# Patient Record
Sex: Female | Born: 1945
Health system: Southern US, Community
[De-identification: ages and names within clinical notes are randomized; demographics above are authoritative.]

## PROBLEM LIST (undated history)

## (undated) DIAGNOSIS — E039 Hypothyroidism, unspecified: Secondary | ICD-10-CM

## (undated) DIAGNOSIS — R0600 Dyspnea, unspecified: Secondary | ICD-10-CM

## (undated) DIAGNOSIS — C50919 Malignant neoplasm of unspecified site of unspecified female breast: Secondary | ICD-10-CM

## (undated) DIAGNOSIS — Z8052 Family history of malignant neoplasm of bladder: Secondary | ICD-10-CM

## (undated) DIAGNOSIS — R630 Anorexia: Secondary | ICD-10-CM

## (undated) DIAGNOSIS — F419 Anxiety disorder, unspecified: Secondary | ICD-10-CM

## (undated) DIAGNOSIS — Z800 Family history of malignant neoplasm of digestive organs: Secondary | ICD-10-CM

## (undated) DIAGNOSIS — I499 Cardiac arrhythmia, unspecified: Secondary | ICD-10-CM

## (undated) DIAGNOSIS — K529 Noninfective gastroenteritis and colitis, unspecified: Secondary | ICD-10-CM

## (undated) DIAGNOSIS — Z853 Personal history of malignant neoplasm of breast: Secondary | ICD-10-CM

## (undated) DIAGNOSIS — R42 Dizziness and giddiness: Secondary | ICD-10-CM

## (undated) DIAGNOSIS — Z5189 Encounter for other specified aftercare: Secondary | ICD-10-CM

## (undated) DIAGNOSIS — C259 Malignant neoplasm of pancreas, unspecified: Secondary | ICD-10-CM

## (undated) DIAGNOSIS — K439 Ventral hernia without obstruction or gangrene: Secondary | ICD-10-CM

## (undated) DIAGNOSIS — Z923 Personal history of irradiation: Secondary | ICD-10-CM

## (undated) DIAGNOSIS — I10 Essential (primary) hypertension: Secondary | ICD-10-CM

## (undated) HISTORY — DX: Family history of malignant neoplasm of bladder: Z80.52

## (undated) HISTORY — DX: Family history of malignant neoplasm of digestive organs: Z80.0

## (undated) HISTORY — PX: BREAST LUMPECTOMY: SHX2

## (undated) HISTORY — PX: HERNIA REPAIR: SHX51

## (undated) HISTORY — PX: ABDOMINAL HYSTERECTOMY: SHX81

## (undated) HISTORY — DX: Encounter for other specified aftercare: Z51.89

## (undated) HISTORY — PX: RETINAL DETACHMENT SURGERY: SHX105

---
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
## 1998-10-02 ENCOUNTER — Ambulatory Visit (HOSPITAL_COMMUNITY): Admission: RE | Admit: 1998-10-02 | Discharge: 1998-10-02 | Payer: Self-pay | Admitting: Obstetrics & Gynecology

## 1999-06-30 ENCOUNTER — Encounter: Payer: Self-pay | Admitting: Emergency Medicine

## 1999-06-30 ENCOUNTER — Emergency Department (HOSPITAL_COMMUNITY): Admission: EM | Admit: 1999-06-30 | Discharge: 1999-06-30 | Payer: Self-pay | Admitting: Emergency Medicine

## 1999-10-08 ENCOUNTER — Ambulatory Visit (HOSPITAL_COMMUNITY): Admission: RE | Admit: 1999-10-08 | Discharge: 1999-10-08 | Payer: Self-pay | Admitting: Obstetrics & Gynecology

## 1999-10-08 ENCOUNTER — Encounter: Payer: Self-pay | Admitting: Family Medicine

## 1999-10-15 ENCOUNTER — Ambulatory Visit (HOSPITAL_COMMUNITY): Admission: RE | Admit: 1999-10-15 | Discharge: 1999-10-15 | Payer: Self-pay | Admitting: Family Medicine

## 1999-10-15 ENCOUNTER — Encounter: Payer: Self-pay | Admitting: Family Medicine

## 1999-10-16 ENCOUNTER — Ambulatory Visit (HOSPITAL_COMMUNITY): Admission: RE | Admit: 1999-10-16 | Discharge: 1999-10-16 | Payer: Self-pay | Admitting: Family Medicine

## 1999-10-16 ENCOUNTER — Encounter: Payer: Self-pay | Admitting: Family Medicine

## 1999-10-24 ENCOUNTER — Encounter: Admission: RE | Admit: 1999-10-24 | Discharge: 1999-10-24 | Payer: Self-pay | Admitting: Surgery

## 1999-10-24 ENCOUNTER — Encounter: Payer: Self-pay | Admitting: Surgery

## 1999-10-28 ENCOUNTER — Ambulatory Visit (HOSPITAL_BASED_OUTPATIENT_CLINIC_OR_DEPARTMENT_OTHER): Admission: RE | Admit: 1999-10-28 | Discharge: 1999-10-28 | Payer: Self-pay | Admitting: Surgery

## 1999-10-28 ENCOUNTER — Encounter (INDEPENDENT_AMBULATORY_CARE_PROVIDER_SITE_OTHER): Payer: Self-pay | Admitting: Specialist

## 1999-10-28 ENCOUNTER — Encounter: Payer: Self-pay | Admitting: Surgery

## 1999-11-08 ENCOUNTER — Encounter: Admission: RE | Admit: 1999-11-08 | Discharge: 2000-02-06 | Payer: Self-pay | Admitting: *Deleted

## 1999-11-25 DIAGNOSIS — Z923 Personal history of irradiation: Secondary | ICD-10-CM

## 1999-11-25 HISTORY — DX: Personal history of irradiation: Z92.3

## 2000-04-30 ENCOUNTER — Encounter: Payer: Self-pay | Admitting: Surgery

## 2000-04-30 ENCOUNTER — Encounter: Admission: RE | Admit: 2000-04-30 | Discharge: 2000-04-30 | Payer: Self-pay | Admitting: Surgery

## 2000-07-02 ENCOUNTER — Inpatient Hospital Stay (HOSPITAL_COMMUNITY): Admission: EM | Admit: 2000-07-02 | Discharge: 2000-07-06 | Payer: Self-pay | Admitting: Emergency Medicine

## 2000-08-28 ENCOUNTER — Encounter: Admission: RE | Admit: 2000-08-28 | Discharge: 2000-08-28 | Payer: Self-pay | Admitting: Surgery

## 2000-08-28 ENCOUNTER — Encounter: Payer: Self-pay | Admitting: Surgery

## 2000-11-03 ENCOUNTER — Encounter: Admission: RE | Admit: 2000-11-03 | Discharge: 2000-11-03 | Payer: Self-pay | Admitting: Surgery

## 2000-11-03 ENCOUNTER — Encounter: Payer: Self-pay | Admitting: Surgery

## 2001-06-05 ENCOUNTER — Emergency Department (HOSPITAL_COMMUNITY): Admission: EM | Admit: 2001-06-05 | Discharge: 2001-06-05 | Payer: Self-pay | Admitting: Emergency Medicine

## 2001-06-07 ENCOUNTER — Encounter: Payer: Self-pay | Admitting: Family Medicine

## 2001-06-07 ENCOUNTER — Encounter: Admission: RE | Admit: 2001-06-07 | Discharge: 2001-06-07 | Payer: Self-pay | Admitting: Family Medicine

## 2001-11-04 ENCOUNTER — Encounter: Payer: Self-pay | Admitting: Surgery

## 2001-11-04 ENCOUNTER — Encounter: Admission: RE | Admit: 2001-11-04 | Discharge: 2001-11-04 | Payer: Self-pay | Admitting: Surgery

## 2002-05-17 ENCOUNTER — Emergency Department (HOSPITAL_COMMUNITY): Admission: EM | Admit: 2002-05-17 | Discharge: 2002-05-17 | Payer: Self-pay | Admitting: Emergency Medicine

## 2002-11-07 ENCOUNTER — Encounter: Payer: Self-pay | Admitting: Surgery

## 2002-11-07 ENCOUNTER — Encounter: Admission: RE | Admit: 2002-11-07 | Discharge: 2002-11-07 | Payer: Self-pay | Admitting: Surgery

## 2003-11-10 ENCOUNTER — Encounter: Admission: RE | Admit: 2003-11-10 | Discharge: 2003-11-10 | Payer: Self-pay | Admitting: Surgery

## 2004-04-19 ENCOUNTER — Emergency Department (HOSPITAL_COMMUNITY): Admission: EM | Admit: 2004-04-19 | Discharge: 2004-04-19 | Payer: Self-pay | Admitting: Emergency Medicine

## 2004-06-04 ENCOUNTER — Emergency Department (HOSPITAL_COMMUNITY): Admission: EM | Admit: 2004-06-04 | Discharge: 2004-06-04 | Payer: Self-pay | Admitting: Emergency Medicine

## 2004-11-13 ENCOUNTER — Encounter: Admission: RE | Admit: 2004-11-13 | Discharge: 2004-11-13 | Payer: Self-pay | Admitting: Surgery

## 2005-12-03 ENCOUNTER — Encounter: Admission: RE | Admit: 2005-12-03 | Discharge: 2005-12-03 | Payer: Self-pay | Admitting: Surgery

## 2006-03-17 ENCOUNTER — Emergency Department (HOSPITAL_COMMUNITY): Admission: EM | Admit: 2006-03-17 | Discharge: 2006-03-17 | Payer: Self-pay | Admitting: Emergency Medicine

## 2006-11-19 ENCOUNTER — Inpatient Hospital Stay (HOSPITAL_COMMUNITY): Admission: EM | Admit: 2006-11-19 | Discharge: 2006-11-22 | Payer: Self-pay | Admitting: Emergency Medicine

## 2006-12-07 ENCOUNTER — Encounter: Admission: RE | Admit: 2006-12-07 | Discharge: 2006-12-07 | Payer: Self-pay | Admitting: Family Medicine

## 2007-10-24 ENCOUNTER — Emergency Department (HOSPITAL_COMMUNITY): Admission: EM | Admit: 2007-10-24 | Discharge: 2007-10-24 | Payer: Self-pay | Admitting: Emergency Medicine

## 2007-12-09 ENCOUNTER — Emergency Department (HOSPITAL_COMMUNITY): Admission: EM | Admit: 2007-12-09 | Discharge: 2007-12-09 | Payer: Self-pay | Admitting: Emergency Medicine

## 2007-12-21 ENCOUNTER — Encounter: Admission: RE | Admit: 2007-12-21 | Discharge: 2007-12-21 | Payer: Self-pay | Admitting: Surgery

## 2008-12-21 ENCOUNTER — Encounter: Admission: RE | Admit: 2008-12-21 | Discharge: 2008-12-21 | Payer: Self-pay | Admitting: Family Medicine

## 2009-12-24 ENCOUNTER — Encounter: Admission: RE | Admit: 2009-12-24 | Discharge: 2009-12-24 | Payer: Self-pay | Admitting: Surgery

## 2010-12-16 ENCOUNTER — Other Ambulatory Visit: Payer: Self-pay | Admitting: Family Medicine

## 2010-12-16 DIAGNOSIS — Z1239 Encounter for other screening for malignant neoplasm of breast: Secondary | ICD-10-CM

## 2010-12-31 ENCOUNTER — Ambulatory Visit
Admission: RE | Admit: 2010-12-31 | Discharge: 2010-12-31 | Disposition: A | Discharge status: Home / Self Care | Payer: Self-pay | Source: Ambulatory Visit | Attending: Family Medicine | Admitting: Family Medicine

## 2010-12-31 DIAGNOSIS — Z1239 Encounter for other screening for malignant neoplasm of breast: Secondary | ICD-10-CM

## 2011-04-11 NOTE — Discharge Summary (Signed)
[place]  Patient:    Julie Rollins, Julie Rollins                       MRN: [id number] Adm. Date:  [id number] Disc. Date: 07/06/00 Attending:  Talitha Givens Dictator:   Tereso Newcomer, P.A. CC:         [facility]  Luis Abed, M.D. [facility]   Discharge Summary  DATE OF BIRTH: [birth year]  PROCEDURES:  1. Cardiac catheterization on July 06, 2000 showing normal coronaries and     normal left ventricular function.  2. Adenosine Cardiolite study on July 03, 2000 showing anterior ischemia.  DISCHARGE DIAGNOSES:  1. History of breast cancer with lumpectomy and radiation in March 2001.  2. Fibromyalgia.  3. 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  HISTORY OF PRESENT ILLNESS: This 65 year old female with a history of breast cancer and fibromyalgia presented to the emergency room with complaint of chest pain that radiated into her neck, which started the day prior to admission.  She described her pain as sharp, over the left sternal area, with intensity of 5/10 at its worst.  She denied shortness of breath, diaphoresis, nausea, vomiting, or diarrhea.  She had noted that she had had pain similar to this over the past several months that could come and go after a few hours. She had never sought medical attention for it before.  She denied her pain being associated with activity.  She did note occasionally getting short of breath with activity.  PHYSICAL EXAMINATION:  GENERAL: Initial physical examination revealed a pleasant and oriented female in no acute distress.  NECK: Without JVD or bruits.  LUNGS: Clear to auscultation.  CARDIAC: Regular rate and rhythm with normal S1 and S2 , without murmurs, rubs, or gallops.  ABDOMEN: Soft, nontender, nondistended.  EXTREMITIES: Left lower extremity trace edema.  Pedal pulses 1+.  (Physical examination was performed by Dr.  Charlyne Mom).  LABORATORY DATA: Initial WBC 6800, hemoglobin 12.6, hematocrit 37.7, platelet count 227,000.  Sodium was 140, potassium 4.1, chloride 104, CO2 36, BUN 15, creatinine 1.2, glucose 111.  The first total CK was 68, CK-MB 0.3, troponin less than 0.03.  EKG showed normal sinus rhythm with a rate of 88, diffuse nonspecific ST-T abnormalities noted.  Chest x-ray was normal.  HOSPITAL COURSE: The patient was admitted and ruled out for myocardial infarction by enzymes.  For risk stratification a lipid profile was checked and her total cholesterol was 161, triglyceride 65, HDL 50, and LDL 98.  Given her negative enzymes she was sent for an adenosine Cardiolite study.  This was performed on July 03, 2000 and images showed focal anteroapical ischemia with an EF of 71%.  Therefore, she was held over the weekend for cardiac catheterization.  She had the catheterization performed on the morning of July 06, 2000 with results as noted above.  She had no immediate complications.  She remained in the short-stay area after catheterization. She ambulated well after her rest period was up and her groin was stable prior to discharge, with no hematoma or bruits, and she was therefore discharged home.  DISCHARGE MEDICATIONS:  1. Aspirin 325 mg q.d.  2. Multivitamin p.o. q.d.  DISCHARGE ACTIVITY: No driving, heavy lifting, or exertion for three days. 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  WOUND CARE: The patient is to watch her groin for any increased swelling, bleeding, or bruising, and is to call the office with concerns.  DISCHARGE DIET: Low-fat/low-salt/low cholesterol diet.  FOLLOW-UP: Follow-up is to be with her primary care physician at [facility] in two to three weeks, and she should call to arrange for an appointment. DD:  07/06/00 TD:   07/06/00 Job: [id number]

## 2011-04-11 NOTE — Consult Note (Signed)
NAMEASTIN, RAPE NO.:  [id number]   MEDICAL RECORD NO.:  [id number]          PATIENT TYPE:  INP   LOCATION:  5702                         FACILITY:  [facility]   PHYSICIAN:  Shirley Friar, MDDATE OF BIRTH:  1946/02/26   DATE OF CONSULTATION:  DATE OF DISCHARGE:                                 CONSULTATION   REFERRING:  Marcellus Scott, MD   REASON FOR CONSULTATION:  Bloody diarrhea, colitis on CT scan.   HISTORY OF PRESENT ILLNESS:  This is a 65 year old white female admitted  11/19/2006 secondary to acute onset of bright red blood per rectum with  loose stools for several days associated with lower abdominal pain.  She  denies any episodes of diarrhea or bloody stools prior to this acute  onset of symptoms except for occasional pink tinge on toilet tissue.  On  presentation CT scan done which showed diffuse thickening on the left  side of the colon suggestive of ischemia.  Her C. diff toxin on  preliminary report is negative.  She has had no further bloody diarrhea  since admission.  Her pain has improved.  She reports two sigmoidoscopes  several years ago, unsure of the results, but denies ever having  colonoscopy.   PAST MEDICAL HISTORY:  1. History of breast cancer.  2. Hypothyroidism.   MEDICATIONS:  On admission:  Meclizine, levothyroxine, Flagyl (completed  a 10 day course for bacterial vaginosis).   FAMILY HISTORY:  Denies colon cancer history.   ALLERGIES:  CODEINE.   REVIEW OF SYSTEMS:  Negative except as stated above.   PHYSICAL EXAMINATION:  VITAL SIGNS:  Temperature 98.5, pulse 86, blood  pressure 154/78, O2 sat 95% on room air.  GENERAL:  Alert, no acute distress.  ABDOMEN:  Mild left lower quadrant tenderness otherwise nontender, soft,  nondistended.   LABORATORY:  White blood count 5.2, hemoglobin 12.9, platelet count 268,  BUN five, creatinine 0.5, potassium 3.5.   IMPRESSION:  This is a 65 year old white female with acute  onset of  bloody diarrhea and abdominal pain with thickening of the left side of  her colon in a diffuse pattern that I feel is likely due to ischemia  over infection.  I  doubt she has Crohn's or ulcerative colitis.  I will plan for unprepped  flexible sigmoidoscopy with sedation to be done on 11/21/2006.  She will  be on a clear liquid diet. I have discussed risks, benefits, and  complications and she agrees to proceed.      Shirley Friar, MD  Electronically Signed     VCS/MEDQ  D:  11/20/2006  T:  11/21/2006  Job:  [id number]

## 2011-04-11 NOTE — Discharge Summary (Signed)
[name], CUMBERLAND NO.:  [id number]   MEDICAL RECORD NO.:  [id number]          PATIENT TYPE:  INP   LOCATION:  5702                         FACILITY:  [facility]   PHYSICIAN:  Marcellus Scott, MD     DATE OF BIRTH:  1946/09/14   DATE OF ADMISSION:  11/18/2006  DATE OF DISCHARGE:  11/22/2006                               DISCHARGE SUMMARY   PRIMARY CARE PHYSICIAN:  [facility].   DISCHARGE DIAGNOSES:  1. Left-sided colitis.  2. Hypothyroidism.  3. Depression.   DISCHARGE MEDICATIONS:  1. Celexa 10 mg p.o. q.h.s.  2. Levothyroxine 50 mcg p.o. daily.   PROCEDURES DONE:  1. On the 27th of December, 2007, abdominal CT with contrast.      Impression is left-sided colitis, likely ischemic.  2. Pelvis CT with contrast on 27th of December, 2007.  Impression is      left-sided colitis, likely ischemic.  3. Flexible sigmoidoscopy without prep, done by GI.  Please refer to      the procedure note by Dr. Bosie Clos.  Essentially seemed to indicate      that no colitis was visualized up to 55 cm.   CONSULTATIONS:  GI by Dr. Bosie Clos.   HOSPITAL COURSE AND CONDITION OF THE PATIENT ON DISCHARGE:  For details  of the initial part of the admission, please refer to the History and  Physical note that was done by Dr. Tamsen Roers on the 26th of December, 2007.  In summary, [name] is a pleasant 65 year old Caucasian female, a  patient with a past medical history of breast cancer, hypothyroidism,  presented with the chief complaints of abdominal pain since the night of  25th of December with associated diarrhea which initially did not have  any blood but subsequently noted small amounts of blood.  No history of  any nausea or vomiting.  The patient had just completed 10 days of  Flagyl for an unclear indication.  On further evaluation, the patient  was noted on a CT scan to have a left-sided colitis, suspicious for  ishemic colitis, and patient was admitted to the  hospital for further  evaluation.  1. Left-sided colitis.  Patient was admitted to the hospital, she was      made N.P.O. and provided with intravenous hydration and GI was      consulted who kindly evaluated her and proceeded to do un-prepped      flexible sigmoidoscopy, the findings of which are as above.  The      colitis seemed to have resolved by the time the sigmoidoscopy was      done.  The patient was then resumed on diet which was graduated and      tolerated.  Patient is now asymptomatic of any abdominal pain and      diarrhea.  The patient is stable for discharge.  She is to follow      up with [facility].  She is to call the office for an appointment so      that a colonoscopy can be scheduled.  The patient is  also being      instructed that if she were to have any recurrence of symptoms she      is to contact her primary care physician or return to the emergency      room.  Patient's lipase and lactate levels were normal.  Patient's      blood cultures done x2 have been negative to date and the final      cultures have to be followed.  Urine culture was negative, which is      a final report, and stool sample for culture and C. diff were      negative.  2. Hypothyroidism.  Patient has been clinically and biochemically      euthyroid, and patient has been continued on her Synthroid.  Her      TSH done in the hospital was 2.4.  3. Questionable depression.  Patient has been on Celexa 10 mg p.o.      q.h.s. at home which was continued in the hospital, and there were      no symptoms related to this.  4. A history of breast cancer.  To be followed up as an outpatient.  5. A history of vertigo.  It was not a presenting symptom during this      hospital admission.  6. A history of vaginal discharge.  It also was not a presenting      feature of this admission and patient had completed a course of      Flagyl, probably for this indication.      Marcellus Scott, MD   Electronically Signed     AH/MEDQ  D:  11/22/2006  T:  11/23/2006  Job:  [id number]   cc:   PCP--[facility]  Shirley Friar, MD

## 2011-04-11 NOTE — H&P (Signed)
Julie Rollins                ACCOUNT NO.:  [id number]   MEDICAL RECORD NO.:  [id number]          PATIENT TYPE:  INP   LOCATION:  5702                         FACILITY:  [facility]   PHYSICIAN:  Beckey Rutter, MD  DATE OF BIRTH:  08-Oct-1946   DATE OF ADMISSION:  11/18/2006  DATE OF DISCHARGE:                              HISTORY & PHYSICAL   PRIMARY CARE PHYSICIAN:  [facility]   CHIEF COMPLAINT UPON ADMISSION:  Diarrhea, mild abdominal pain.   HISTORY OF PRESENT ILLNESS:  This is a 65 year old Caucasian female with  past medical history significant of breast cancer, hypothyroidism came  today with a chief complaint of diarrhea.  It started yesterday with  ablation cramps.  Yesterday she had about 2 episodes of diarrhea with  blood-colored stool as she described it.  Patient did not feel well over  the night and this morning she had also 2 diarrheas with dark-colored  stool.  She has abdominal pain and discomfort all the time but the pain  is not that severe.  It is usually around 5/5, comes and goes.  Mainly  on the left side.  Patient denied fever, vomiting, but she admitted to  having headache after she came here to the ER right now.   PAST MEDICAL HISTORY:  1. Breast cancer.  2. Hypothyroidism.  3. Vertigo.  4. Vaginal discharge.   FAMILY HISTORY:  Brother with bladder cancer.   SOCIAL HISTORY:  No tobacco abuse, no illicit drug abuse.   DRUG ALLERGIES:  CODEINE.   MEDICATIONS:  1. Meclizine.  2. Levothyroxine 50 mcg.  3. Celexa 20 mg daily.  4. Flagyl 500 mg 3 times a day for recent bacterial discharge as per      her, bacterial vaginosis.   REVIEW OF SYSTEMS:  Unremarkable.   PHYSICAL EXAMINATION:  Temperature 97.0, pulse 79, respiratory rate is  16, blood pressure 148/77.  GENERAL:  Comfortable in bed, not in distress.  HEAD:  Atraumatic, normocephalic.  Eyes:  PERRLA.  Mouth:  Moist, no  ulcers.  NECK:  Supple.  No JVD.  CHEST:  First,  second heart sound audible.  No added sound appreciated.  LUNGS:  Bilateral fair air entry.  No adventitious sounds.  ABDOMEN:  Soft, nontender, mild tenderness on deep palpation on the left  side and left lumbar region.  EXTREMITIES:  No edema.  SKIN:  Moist.  NEUROLOGICAL:  Alert, oriented x3, giving history.   LABS/X-RAY:  CT abdomen showing the impression of left-sided colitis.  Lactic acid 0.7, within normal limits; lipase 26.  UA negative.  AST 18,  ALT 14, alk phos 75, total bilirubin 0.6, sodium 139, potassium 3.3,  chloride 105, bicarb 26, BUN 7, creatinine 0.6, glucose 121.   ASSESSMENT AND PLAN:  This is a 65 year old female with diarrhea,  abdominal cramps, with evidence of colitis on the CT scan.  Patient  clinically does not seem to be an acute abdomen right now.  We will  admit the patient to the medical floor for further observation and  management.  Patient will be NPO.  We will start IV fluids.  Surgical  consult as well as gastroenterology consult for further management  regarding the decision of colonoscopy once the patient is stable.  We  will continue following up CBC.  We will check a stool guaiac for  documentation.  We will send this for C. diff.  Will repeat lactic  acidosis.  Regarding the hypothyroidism diagnosis from before, we will  continue the same dose of Synthroid.  Patient also is taking Celexa as  per her, for depressed mood.  We will continue the same dose.  We will  repeat potassium and follow up with BMP.  For DVT prophylaxis, patient  will start on heparin subcutaneously.  Patient will be continued on  Protonix IV for her condition, and that will cover the prophylaxis for  GI.      Beckey Rutter, MD  Electronically Signed     EME/MEDQ  D:  11/19/2006  T:  11/20/2006  Job:  [id number]

## 2011-04-11 NOTE — Op Note (Signed)
[place]  Patient:    Julie Rollins                        MRN: [id number] Proc. Date: 10/28/99 Adm. Date:  [id number] Attending:  Charlton Haws CC:         Blair Hailey. 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  PROCEDURE:  Needle localized excision of area of ductal carcinoma in situ - left breast.  SURGEON:  Currie Paris, M.D.  ANESTHESIA:  General.  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  After discussion of alternatives with the patient, we elected to proceed to a wide local excision.  The day of surgery, she mentioned to me a small lipoma that has been present on her side, but we elected at this point since this has been present and asymptomatic  many years to leave it alone.  DESCRIPTION OF PROCEDURE:  Patient brought to the operating room, having had a guidewire placed in the lower, inner quadrant of the left breast.  After satisfactory general anesthesia had been obtained, the breast was prepped.  I made a curvilinear incision just above the guidewire and that thus between the guidewire and the nipple area.  I divided a little of the subcutaneous tissues and then manipulated the wire into the wound.  Using cutting current on the cautery, I took a wide excision of breast tissue down to the chest wall and up under the nipple to get completely and well around the area of the guidewire.  This is sent for specimen mammography.  There is some of what appears to be fibrocystic changes along the inferior edge of what I had taken out, so I took a little bit more from the  inferior border to make sure we did not leave any breast tissue there that might contain some DCIS.  This is marked separately and sent separately.  The wound was irrigated and hemostasis achieved with the cautery.  When everything appeared to be dry, I put some clips on to mark the borders of the excision and then closed with some 3-0 Vicryl followed by 4-0 Monocryl subcuticular plus Steri-Strips. Prior to closing, I infiltrated with approximately 20 cc of 0.25% Marcaine. The patient tolerated the procedure well.  There are no operative complications. All counts are correct. DD:  10/28/99 TD:  10/29/99 Job: [id number]

## 2011-04-11 NOTE — Op Note (Signed)
NAMESABELLA, TRAORE                ACCOUNT NO.:  [id number]   MEDICAL RECORD NO.:  [id number]          PATIENT TYPE:  INP   LOCATION:  5702                         FACILITY:  [facility]   PHYSICIAN:  Shirley Friar, MDDATE OF BIRTH:  Mar 01, 1946   DATE OF PROCEDURE:  11/21/2006  DATE OF DISCHARGE:                               OPERATIVE REPORT   PROCEDURE:  Flexible sigmoidoscopy.   INDICATION:  Bloody diarrhea, colonic thickening on CT scan.   MEDICATIONS:  Fentanyl 100 mcg IV, Versed 10 mg IV.   FINDINGS:  Adult adjustable colonoscope was inserted into a unprepped  colon where solid dark brown stool was noted.  Due to looping and  sigmoid colon, the adult scope was removed and a pediatric colonoscope  was then inserted.  A pediatric colonoscope was able to be advanced up  to 55 cm at the splenic flexure and with repeated irrigation into the  lumen of the colon.  No ulcers or erythema were seen, and no blood was  noted.  Solid dark brown stool prevented evaluation for small polyps.  Retroflexion was done and showed small internal hemorrhoids.   ASSESSMENT:  1. No colitis visualized up to 55 cm.  2. The patient is to have outpatient colonoscopy which will be      scheduled.  3. Advance diet.      Shirley Friar, MD  Electronically Signed     VCS/MEDQ  D:  11/21/2006  T:  11/21/2006  Job:  [id number]

## 2011-04-11 NOTE — Cardiovascular Report (Signed)
[place]. [facility]  Patient:    Julie Rollins, Julie Rollins                       MRN: [id number] Proc. Date: 07/06/00 Adm. Date:  [id number] Attending:  Talitha Givens CC:         Teena Irani. Arlyce Dice, M.D., [facility]  Heywood Footman, N.P., [facility]  Luis Abed, M.D. [facility]  Cardiopulmonary Laboratory   Cardiac Catheterization  CLINICAL HISTORY:  Ms. Dorantes is 65 years old and was admitted with chest pain that was felt to be somewhat atypical for ischemia.  She underwent a Cardiolite scan which suggested anterior ischemia and she was scheduled for catheterization.  DESCRIPTION OF PROCEDURE:  The procedure was performed via the right femoral artery using an arterial sheath and 6 French preformed coronary catheters.  A front wall arterial puncture was performed and Omnipaque contrast was used. At the completion of the study the right femoral artery was closed with Perclose.  The patient tolerated the procedure well and left the laboratory in satisfactory condition.  RESULTS:  The aortic pressure was 150/730 with a mean of 100.  Left ventricular pressure was 150/15.  ANGIOGRAPHY:  The left main coronary artery:  The left main coronary artery was free of significant disease.  Left anterior descending:  The left anterior descending artery gave rise to two diagonal branches and a large septal perforator.  These and the LAD proper were free of significant disease.  Circumflex artery:  The circumflex artery gave rise to a large marginal branch which had three subbranches and an AV branch.  These vessels were free of significant disease.  Right coronary artery:  The right coronary is a moderately large vessel that gave rise to the posterior descending and three posterolateral branches. These vessels were free of significant disease.  LEFT VENTRICULOGRAPHY:  The left ventriculogram was performed in the RAO projection showed  good wall motion with no area of hypokinesis.  The estimated ejection fraction was 60%.  CONCLUSIONS:  Normal coronary angiography and left ventricular wall motion.  RECOMMENDATIONS:  Reassurance.  The patients chest pain is quite atypical and we suspect it is probably musculoskeletal chest pain.  She does have a diagnosis of questionable fibromyalgia.  We will arrange followup with [facility].DD:  07/06/00 TD:  07/06/00 Job: [id number]

## 2011-08-15 LAB — I-STAT 8, (EC8 V) (CONVERTED LAB)
Acid-Base Excess: 1
BUN: 11
Bicarbonate: 27.6 — ABNORMAL HIGH
Chloride: 107
Glucose, Bld: 117 — ABNORMAL HIGH
HCT: 41
Hemoglobin: 13.9
Operator id: 261381
Potassium: 4
Sodium: 139
TCO2: 29
pCO2, Ven: 48.6
pH, Ven: 7.363 — ABNORMAL HIGH

## 2011-08-15 LAB — URINALYSIS, ROUTINE W REFLEX MICROSCOPIC
Bilirubin Urine: NEGATIVE
Glucose, UA: NEGATIVE
Hgb urine dipstick: NEGATIVE
Ketones, ur: NEGATIVE
Nitrite: NEGATIVE
Protein, ur: NEGATIVE
Specific Gravity, Urine: 1.007
Urobilinogen, UA: 0.2
pH: 6.5

## 2011-08-15 LAB — POCT I-STAT CREATININE
Creatinine, Ser: 0.9
Operator id: 261381

## 2011-09-02 LAB — URINALYSIS, ROUTINE W REFLEX MICROSCOPIC
Bilirubin Urine: NEGATIVE
Glucose, UA: NEGATIVE
Hgb urine dipstick: NEGATIVE
Ketones, ur: NEGATIVE
Nitrite: NEGATIVE
Protein, ur: NEGATIVE
Specific Gravity, Urine: 1.019
Urobilinogen, UA: 0.2
pH: 5.5

## 2011-09-02 LAB — OCCULT BLOOD X 1 CARD TO LAB, STOOL: Fecal Occult Bld: NEGATIVE

## 2011-11-27 ENCOUNTER — Other Ambulatory Visit (INDEPENDENT_AMBULATORY_CARE_PROVIDER_SITE_OTHER): Payer: Self-pay | Admitting: Surgery

## 2011-11-27 DIAGNOSIS — Z1231 Encounter for screening mammogram for malignant neoplasm of breast: Secondary | ICD-10-CM

## 2011-11-27 DIAGNOSIS — Z853 Personal history of malignant neoplasm of breast: Secondary | ICD-10-CM

## 2012-01-02 ENCOUNTER — Ambulatory Visit
Admission: RE | Admit: 2012-01-02 | Discharge: 2012-01-02 | Disposition: A | Discharge status: Home / Self Care | Payer: Medicare Other | Source: Ambulatory Visit | Attending: Surgery | Admitting: Surgery

## 2012-01-02 DIAGNOSIS — Z1231 Encounter for screening mammogram for malignant neoplasm of breast: Secondary | ICD-10-CM

## 2012-01-02 DIAGNOSIS — Z853 Personal history of malignant neoplasm of breast: Secondary | ICD-10-CM

## 2012-02-12 ENCOUNTER — Emergency Department (HOSPITAL_COMMUNITY)
Admission: EM | Admit: 2012-02-12 | Discharge: 2012-02-12 | Disposition: A | Discharge status: Home Health | Payer: Medicare Other | Attending: Emergency Medicine | Admitting: Emergency Medicine

## 2012-02-12 ENCOUNTER — Other Ambulatory Visit: Payer: Self-pay

## 2012-02-12 ENCOUNTER — Emergency Department (HOSPITAL_COMMUNITY): Payer: Medicare Other

## 2012-02-12 ENCOUNTER — Encounter (HOSPITAL_COMMUNITY): Payer: Self-pay

## 2012-02-12 DIAGNOSIS — R112 Nausea with vomiting, unspecified: Secondary | ICD-10-CM | POA: Insufficient documentation

## 2012-02-12 DIAGNOSIS — R6883 Chills (without fever): Secondary | ICD-10-CM | POA: Insufficient documentation

## 2012-02-12 DIAGNOSIS — I4949 Other premature depolarization: Secondary | ICD-10-CM | POA: Insufficient documentation

## 2012-02-12 DIAGNOSIS — R9431 Abnormal electrocardiogram [ECG] [EKG]: Secondary | ICD-10-CM | POA: Insufficient documentation

## 2012-02-12 DIAGNOSIS — I493 Ventricular premature depolarization: Secondary | ICD-10-CM

## 2012-02-12 DIAGNOSIS — I10 Essential (primary) hypertension: Secondary | ICD-10-CM | POA: Insufficient documentation

## 2012-02-12 DIAGNOSIS — R197 Diarrhea, unspecified: Secondary | ICD-10-CM | POA: Insufficient documentation

## 2012-02-12 DIAGNOSIS — R070 Pain in throat: Secondary | ICD-10-CM | POA: Insufficient documentation

## 2012-02-12 DIAGNOSIS — R0602 Shortness of breath: Secondary | ICD-10-CM | POA: Insufficient documentation

## 2012-02-12 DIAGNOSIS — E039 Hypothyroidism, unspecified: Secondary | ICD-10-CM | POA: Insufficient documentation

## 2012-02-12 DIAGNOSIS — Z853 Personal history of malignant neoplasm of breast: Secondary | ICD-10-CM | POA: Insufficient documentation

## 2012-02-12 DIAGNOSIS — R61 Generalized hyperhidrosis: Secondary | ICD-10-CM | POA: Insufficient documentation

## 2012-02-12 DIAGNOSIS — R55 Syncope and collapse: Secondary | ICD-10-CM | POA: Insufficient documentation

## 2012-02-12 DIAGNOSIS — R42 Dizziness and giddiness: Secondary | ICD-10-CM | POA: Insufficient documentation

## 2012-02-12 HISTORY — DX: Anxiety disorder, unspecified: F41.9

## 2012-02-12 HISTORY — DX: Noninfective gastroenteritis and colitis, unspecified: K52.9

## 2012-02-12 HISTORY — DX: Malignant neoplasm of unspecified site of unspecified female breast: C50.919

## 2012-02-12 HISTORY — DX: Hypothyroidism, unspecified: E03.9

## 2012-02-12 HISTORY — DX: Essential (primary) hypertension: I10

## 2012-02-12 LAB — POCT I-STAT TROPONIN I

## 2012-02-12 LAB — POCT I-STAT, CHEM 8
BUN: 7 mg/dL (ref 6–23)
Creatinine, Ser: 0.7 mg/dL (ref 0.50–1.10)
Hemoglobin: 14.3 g/dL (ref 12.0–15.0)
Potassium: 3.5 mEq/L (ref 3.5–5.1)
Sodium: 142 mEq/L (ref 135–145)

## 2012-02-12 MED ORDER — SODIUM CHLORIDE 0.9 % IV BOLUS (SEPSIS)
1000.0000 mL | Freq: Once | INTRAVENOUS | Status: AC
  Administered 2012-02-12: 1000 mL via INTRAVENOUS

## 2012-02-12 MED ORDER — ACETAMINOPHEN 160 MG/5ML PO SOLN
650.0000 mg | Freq: Once | ORAL | Status: AC
  Administered 2012-02-12: 650 mg via ORAL
  Filled 2012-02-12: qty 20.3

## 2012-02-12 MED ORDER — ONDANSETRON HCL 4 MG/2ML IJ SOLN
4.0000 mg | Freq: Once | INTRAMUSCULAR | Status: AC
  Administered 2012-02-12: 4 mg via INTRAVENOUS
  Filled 2012-02-12: qty 2

## 2012-02-12 NOTE — Discharge Instructions (Signed)
Near-Syncope Near-syncope is sudden weakness, dizziness, or feeling like you might pass out (faint). This may occur when getting up after sitting or while standing for a long period of time. Near-syncope can be caused by a drop in blood pressure. This is a common reaction, but it may occur to a greater degree in people taking medicines to control their blood pressure. Fainting often occurs when the blood pressure or pulse is too low to provide enough blood flow to the brain to keep you conscious. Fainting and near-syncope are not usually due to serious medical problems. However, certain people should be more cautious in the event of near-syncope, including elderly patients, patients with diabetes, and patients with a history of heart conditions (especially irregular rhythms).  CAUSES   Drop in blood pressure.   Physical pain.   Dehydration.   Heat exhaustion.   Emotional distress.   Low blood sugar.   Internal bleeding.   Heart and circulatory problems.   Infections.  SYMPTOMS   Dizziness.   Feeling sick to your stomach (nauseous).   Nearly fainting.   Body numbness.   Turning pale.   Tunnel vision.   Weakness.  HOME CARE INSTRUCTIONS   Lie down right away if you start feeling like you might faint. Breathe deeply and steadily. Wait until all the symptoms have passed. Most of these episodes last only a few minutes. You may feel tired for several hours.   Drink enough fluids to keep your urine clear or pale yellow.   If you are taking blood pressure or heart medicine, get up slowly, taking several minutes to sit and then stand. This can reduce dizziness that is caused by a drop in blood pressure.  SEEK IMMEDIATE MEDICAL CARE IF:   You have a severe headache.   Unusual pain develops in the chest, abdomen, or back.   There is bleeding from the mouth or rectum, or you have black or tarry stool.   An irregular heartbeat or a very rapid pulse develops.   You have  repeated fainting or seizure-like jerking during an episode.   You faint when sitting or lying down.   You develop confusion.   You have difficulty walking.   Severe weakness develops.   Vision problems develop.  MAKE SURE YOU:   Understand these instructions.   Will watch your condition.   Will get help right away if you are not doing well or get worse.  Document Released: 11/10/2005 Document Revised: 10/30/2011 Document Reviewed: 12/27/2010 [employer] Patient Information 2012 [employer]. 

## 2012-02-12 NOTE — ED Provider Notes (Signed)
History     CSN: [id number]  Arrival date & time 02/12/12  1308   First MD Initiated Contact with Patient 02/12/12 [phone]      Chief Complaint  Patient presents with  . Near Syncope     HPI Pt presents with report of near syncope this morning. Pt reports onset of sore throat, nausea, vomiting and diarrhea since yesterday. Got up early this morning, walked into kitchen and felt dizzy.  Patient denies fever or or vomiting.  No chest pain.  Past Medical History  Diagnosis Date  . Hypothyroid   . Anxiety     No past surgical history on file.  No family history on file.  History  Substance Use Topics  . Smoking status: Never Smoker   . Smokeless tobacco: Not on file  . Alcohol Use: No    OB History    Grav Para Term Preterm Abortions TAB SAB Ect Mult Living                  Review of Systems  All other systems reviewed and are negative.    Allergies  Codeine  Home Medications   Current Outpatient Rx  Name Route Sig Dispense Refill  . GOODY HEADACHE PO Oral Take 1 packet by mouth 2 (two) times daily as needed. For headache    . ALKA-SELTZER PLUS COLD PO Oral Take 2 capsules by mouth every 4 (four) hours as needed. For cold    . CITALOPRAM HYDROBROMIDE 20 MG PO TABS Oral Take 20 mg by mouth at bedtime.    Marland Kitchen LEVOTHYROXINE SODIUM 50 MCG PO TABS Oral Take 50 mcg by mouth every morning.      BP 113/52  Pulse 99  Temp(Src) 98.1 F (36.7 C) (Oral)  Resp 22  Ht 5\' 4"  (1.626 m)  Wt 150 lb (68.04 kg)  BMI 25.75 kg/m2  SpO2 94%  Physical Exam  Nursing note and vitals reviewed. Constitutional: She is oriented to person, place, and time. She appears well-developed and well-nourished. No distress.  HENT:  Head: Normocephalic and atraumatic.  Eyes: Pupils are equal, round, and reactive to light.  Neck: Normal range of motion.  Cardiovascular: Normal rate and intact distal pulses.          Date: 02/12/2012  Rate: 95  Rhythm: normal sinus rhythm with VPCs  QRS  Axis: normal  Intervals: normal  ST/T Wave abnormalities: ST depressions laterally  Conduction Disutrbances:none:   Old EKG Reviewed: changes noted     Pulmonary/Chest: No respiratory distress.  Abdominal: Normal appearance. She exhibits no distension.  Musculoskeletal: Normal range of motion.  Neurological: She is alert and oriented to person, place, and time. No cranial nerve deficit.  Skin: Skin is warm and dry. No rash noted.  Psychiatric: She has a normal mood and affect. Her behavior is normal.    ED Course  Procedures (including critical care time)  Labs Reviewed  POCT I-STAT, CHEM 8 - Abnormal; Notable for the following:    Glucose, Bld 111 (*)    All other components within normal limits  PROTIME-INR  POCT I-STAT TROPONIN I   Dg Chest Portable 1 View  02/12/2012  *RADIOLOGY REPORT*  Clinical Data: Near-syncope with headache and sore throat.  PORTABLE CHEST - 1 VIEW  Comparison: 12/09/2007.  Findings: Trachea is midline.  Heart size stable.  Probable accentuation of the vascular shadows in the right paratracheal region due to apical lordotic technique.  Lungs are somewhat low in volume but  clear.  No pleural fluid.  IMPRESSION: No acute findings.  Original Report Authenticated By: Reyes Ivan, M.D.     1. Vomiting and diarrhea   2. Abnormal EKG   3. VPC's       MDM  I discussed the case with cardiology who is planning on coming to the emergency department for evaluation.        Nelia Shi, MD 02/15/12 1101

## 2012-02-12 NOTE — ED Provider Notes (Signed)
Patient in ED for evaluation of near syncopal episode after experiencing diarrhea and vomiting this morning.  Patient has been seen and cleared by cardiology.  Patient remains asymptomatic.  Consumed a meal in ED without difficulty.  On exam, patient CAROx4.  Lungs CTA bilaterally.  S1/S2, RRR, no murmur.  Active bowel sounds.  Patient will be discharged home with follow-up by her PCP.  Jimmye Norman, NP 02/12/12 2207

## 2012-02-12 NOTE — ED Notes (Signed)
Pt presents with report of near syncope this morning.  Pt reports onset of sore throat, nausea, vomiting and diarrhea since yesterday.  Got up early this morning, walked into kitchen and felt dizzy.

## 2012-02-12 NOTE — Consult Note (Signed)
CARDIOLOGY CONSULT NOTE  Patient ID: Julie Rollins, MRN: [id number], DOB/AGE: 1946-05-16 66 y.o. Admit date: 02/12/2012 Date of Consult: 02/12/2012  Primary Physician: Dr. Mady Gemma Primary Cardiologist: None  Chief Complaint: Presyncope  HPI: 66 y.o. female w/ PMHx significant for Vertigo, hypothyroidism, and normal coronaries by cath '01 who presented to [facility] on 02/12/2012 with complaints of presyncope.  She had an abnormal stress test in 2001 for which she underwent cardiac cath that revealed normal coronary arteries and EF 60%. She has not had any cardiac evaluation since that time and denies anginal symptoms.  She has been evaluated in the past for dizziness and was diagnosed with vertigo and placed on meclizine in 2005. In 2008 & 2009 she was evaluated in the ED for symptoms of lightheadedness and presyncope (unsure of the workup). She reports waking up Thursday morning (02/11/12) with a sore throat, diarrhea, and chills. She took Catering manager for her sore throat and had soup for lunch. She continued to feel poorly throughout the day and couldn't sleep that night. This morning she woke up and felt lightheaded when she got out of bed. She walked into the kitchen and continued to feel lightheaded, sweaty and mildly sob and thought she might pass out. She sat down vomited "a little" and called EMS. She lives alone and stated she gets scared when she feels poorly. She denies chest pain, visual changes, syncope, or palpitations. No sick contacts, fever, weight changes, hematemesis or hematochezia, no change in bladder habits. She is somewhat active. She goes dancing occasionally on the weekends, does housework, and is able to climb stairs without c/o chest pain or presyncopal symptoms.   In the ED she was afebrile and orthostatic, labs significant for normal poc troponin and chem 8. CXR without acute findings. EKG showed NSR 95bpm, nonspecific ST changes. She received zofran and  tylenol for headache.   Past Medical History  Diagnosis Date  . Hypothyroid   . Anxiety   . Breast CA     s/p lumpectomy and radiation 2000  . HTN (hypertension)     "mild hypertension", was on BP med years ago but it caused orthostatic hypotension and she has not been medicated since  . Colitis     2007  Stress 2001-  FOCAL ANTEROAPICAL ISCHEMIA.  POSSIBLE LEFT VENTRICULAR HYPERTROPHY.  QGSEF 71%.  Cath 2001 -  The left main coronary artery:  The left main coronary artery was free of significant disease. Left anterior descending:  The left anterior descending artery gave rise to two diagonal branches and a large septal perforator.  These and the LAD proper were free of significant disease. Circumflex artery:  The circumflex artery gave rise to a large marginal branch which had three subbranches and an AV branch.  These vessels were free of significant disease. Right coronary artery:  The right coronary is a moderately large vessel that gave rise to the posterior descending and three posterolateral branches. These vessels were free of significant disease. LEFT VENTRICULOGRAPHY:  The left ventriculogram was performed in the RAO projection showed good wall motion with no area of hypokinesis.  The estimated ejection fraction was 60%. CONCLUSIONS:  Normal coronary angiography and left ventricular wall motion.   Surgical History:  Past Surgical History  Procedure Date  . Breast lumpectomy     2000  . Hernia repair     20+ years ago  . Abdominal hysterectomy     22yrs ago     Home  Meds: Medication Sig  Aspirin-Acetaminophen-Caffeine (GOODY HEADACHE PO) Take 1 packet by mouth 2 (two) times daily as needed. For headache  Chlorphen-Phenyleph-ASA (ALKA-SELTZER PLUS COLD PO) Take 2 capsules by mouth every 4 (four) hours as needed. For cold  citalopram (CELEXA) 20 MG tablet Take 20 mg by mouth at bedtime.  levothyroxine (SYNTHROID, LEVOTHROID) 50 MCG tablet Take 50 mcg by mouth every  morning.    Inpatient Medications:  . acetaminophen (TYLENOL) oral liquid 160 mg/5 mL  650 mg Oral Once  . ondansetron  4 mg Intravenous Once  . sodium chloride  1,000 mL Intravenous Once   Allergies:  Allergies  Allergen Reactions  . Codeine Nausea Only   Social History  . Marital Status: Divorced   Occupational History  . retired     [employer]    Social History Main Topics  . Smoking status: Never Smoker   . Alcohol Use: No  . Drug Use: No   Social History Narrative   Lives alone     Family History  Problem Relation Age of Onset  . Other      Denies family h/o cardiac disease    Review of Systems: General: (+) chills; negative for fever, night sweats or weight changes.  Cardiovascular: negative for chest pain, shortness of breath, dyspnea on exertion, edema, orthopnea, palpitations, or paroxysmal nocturnal dyspnea Dermatological: negative for rash Respiratory: negative for cough or wheezing Urologic: negative for hematuria Abdominal: (+) n/v/d; negative for bright red blood per rectum, melena, or hematemesis Neurologic: (+) Presyncope; negative for visual changes, syncope All other systems reviewed and are otherwise negative except as noted above.  Labs:  Component Value Date   HGB 14.3 02/12/2012   HCT 42.0 02/12/2012     02/12/2012 10:26  Troponin i, poc 0.01   Lab 02/12/12 0945  NA 142  K 3.5  CL 105  BUN 7  CREATININE 0.70  GLUCOSE 111*     02/12/2012 09:11  Prothrombin Time 14.3  INR 1.09    Radiology/Studies:   02/12/2012 - CXR  Findings: Trachea is midline.  Heart size stable.  Probable accentuation of the vascular shadows in the right paratracheal region due to apical lordotic technique.  Lungs are somewhat low in volume but clear.  No pleural fluid.  IMPRESSION: No acute findings.     EKG: NSR 95bpm, nonspecific ST changes   Physical Exam: Blood pressure 130/67, pulse 82, temperature 98.1 F (36.7 C), temperature source Oral, resp.  rate 19, height 5\' 4"  (1.626 m), weight 150 lb (68.04 kg), SpO2 97.00%. General: Overweight elderly white female in no acute distress. Head: Normocephalic, atraumatic, sclera non-icteric, no xanthomas, nares are without discharge.  Neck: Supple. Negative for carotid bruits. JVD not elevated. Lungs: Clear bilaterally to auscultation without wheezes, rales, or rhonchi. Breathing is unlabored. Heart: RRR with S1 S2. No murmurs, rubs, or gallops appreciated. Abdomen: Soft, non-tender, non-distended with normoactive bowel sounds. No hepatomegaly. No rebound/guarding. No obvious abdominal masses. Msk:  Strength and tone appear normal for age. Extremities: No clubbing or cyanosis. No edema.  Distal pedal pulses are 2+ and equal bilaterally. Neuro: Alert and oriented X 3. Moves all extremities spontaneously. Psych:  Responds to questions appropriately with a normal affect.   Assessment and Plan:  66 y.o. female w/ PMHx significant for Vertigo, hypothyroidism, and normal coronaries by cath '01 who presented to [facility] on 02/12/2012 with complaints of presyncope.  1. Presyncope: Patient presents with presyncopal symptoms in the setting of chills,  sore throat, and diarrhea. She is orthostatic, but otherwise without acute findings. EKG NSR without acute ischemic changes. poc troponin normal. Her symptoms are likely due to dehydration. She has received IVF. Would recommend repeating orthostatic vital signs, EKG, and allow her to eat and drink. No further cardiac workup necessary.  Signed, HOPE, JESSICA PA-C 02/12/2012, 2:43 PM  Patient seen and examined.  Agree with findings of J. Hope   Briefly, patient is a 66 year old with no known CAD Admitted with dizziness.   EKG done showing sublte ST depression. Patient denies CP.  Is somewhat active.  Denies DOE or CP with activity Yesterday ill.  Poor po intake.  Today dizzy. Currently dizziness improved.  C/o headache  No SOB or CP  On exam,  patient ws initially orthostatic with drop in BP.  HR unchanged Lung:  CTA  Cardiac:  RRR  S1, S2, No S3.  No murmurs. Abd:  Benign  Ext  No edema.  EKGs with sl ST depression inferolaterally but not specific.    Impression 1.  EKG changes.  I am not concerned by these changes.  I do not think they represent ischemia.  Patient is asymptomatic.  Should follow up with primary MD  2.  Dizziness.  Clnically improved with fluids  Would repeat orthostatics.  Encouraged fluid and salt intake.  OK to d/c home when BP improves.  Dietrich Pates 6:17 PM 02/12/2012

## 2012-02-12 NOTE — ED Provider Notes (Signed)
1:02 PM Patient in CDU holding for cardiology consult.  Sign out received from Dr Radford Pax.  Patient with N/V/D, sensation of presyncope, found to have EKG changes.  Pt currently reports headache, soreness of her abdomen.  Denies nausea or chest pain.  On exam, pt is A&Ox4, NAD, RRR, CTAB, abd soft, nondistended, no focal tenderness, no guarding, no rebound.    Patient has been seen by [facility] cardiology.  PA Shanda Bumps) and Dr Tenny Craw have seen the patient, have ordered repeat EKG, IVF, orthostatic VS.  Their decision pending.    If they decide no further workup is needed, patient may be discharged home with dx gastroenteritis (per Dr Radford Pax) with return precautions.    3:47 PM Discussed patient with Felicie Morn, NP, who assumes care of patient at change of shift.    Dillard Cannon [place], [place] 02/12/12 1547

## 2012-12-07 ENCOUNTER — Other Ambulatory Visit: Payer: Self-pay | Admitting: Family Medicine

## 2012-12-07 DIAGNOSIS — Z1231 Encounter for screening mammogram for malignant neoplasm of breast: Secondary | ICD-10-CM

## 2012-12-07 DIAGNOSIS — Z9889 Other specified postprocedural states: Secondary | ICD-10-CM

## 2012-12-07 DIAGNOSIS — Z853 Personal history of malignant neoplasm of breast: Secondary | ICD-10-CM

## 2013-01-03 ENCOUNTER — Ambulatory Visit: Payer: Medicare Other

## 2013-01-13 ENCOUNTER — Ambulatory Visit
Admission: RE | Admit: 2013-01-13 | Discharge: 2013-01-13 | Disposition: A | Discharge status: Home / Self Care | Payer: Medicare Other | Source: Ambulatory Visit | Attending: Family Medicine | Admitting: Family Medicine

## 2013-01-13 DIAGNOSIS — Z1231 Encounter for screening mammogram for malignant neoplasm of breast: Secondary | ICD-10-CM

## 2013-01-13 DIAGNOSIS — Z853 Personal history of malignant neoplasm of breast: Secondary | ICD-10-CM

## 2013-01-13 DIAGNOSIS — Z9889 Other specified postprocedural states: Secondary | ICD-10-CM

## 2013-12-13 ENCOUNTER — Other Ambulatory Visit: Payer: Self-pay

## 2013-12-13 DIAGNOSIS — Z1231 Encounter for screening mammogram for malignant neoplasm of breast: Secondary | ICD-10-CM

## 2013-12-13 DIAGNOSIS — Z853 Personal history of malignant neoplasm of breast: Secondary | ICD-10-CM

## 2013-12-13 DIAGNOSIS — Z9889 Other specified postprocedural states: Secondary | ICD-10-CM

## 2014-01-16 ENCOUNTER — Ambulatory Visit
Admission: RE | Admit: 2014-01-16 | Discharge: 2014-01-16 | Disposition: A | Discharge status: Home / Self Care | Payer: Medicare Other | Source: Ambulatory Visit

## 2014-01-16 DIAGNOSIS — Z1231 Encounter for screening mammogram for malignant neoplasm of breast: Secondary | ICD-10-CM

## 2014-01-16 DIAGNOSIS — Z853 Personal history of malignant neoplasm of breast: Secondary | ICD-10-CM

## 2014-01-16 DIAGNOSIS — Z9889 Other specified postprocedural states: Secondary | ICD-10-CM

## 2014-03-08 ENCOUNTER — Encounter (INDEPENDENT_AMBULATORY_CARE_PROVIDER_SITE_OTHER): Payer: Medicare Other | Admitting: Ophthalmology

## 2014-03-08 DIAGNOSIS — H2510 Age-related nuclear cataract, unspecified eye: Secondary | ICD-10-CM

## 2014-03-08 DIAGNOSIS — H33309 Unspecified retinal break, unspecified eye: Secondary | ICD-10-CM

## 2014-03-08 DIAGNOSIS — H43819 Vitreous degeneration, unspecified eye: Secondary | ICD-10-CM

## 2014-03-16 ENCOUNTER — Ambulatory Visit (INDEPENDENT_AMBULATORY_CARE_PROVIDER_SITE_OTHER): Payer: Medicare Other | Admitting: Ophthalmology

## 2014-03-16 DIAGNOSIS — H33309 Unspecified retinal break, unspecified eye: Secondary | ICD-10-CM

## 2014-06-16 ENCOUNTER — Emergency Department (HOSPITAL_COMMUNITY)
Admission: EM | Admit: 2014-06-16 | Discharge: 2014-06-16 | Disposition: A | Discharge status: Home / Self Care | Payer: Medicare Other | Attending: Emergency Medicine | Admitting: Emergency Medicine

## 2014-06-16 ENCOUNTER — Encounter (HOSPITAL_COMMUNITY): Payer: Self-pay | Admitting: Emergency Medicine

## 2014-06-16 DIAGNOSIS — Z853 Personal history of malignant neoplasm of breast: Secondary | ICD-10-CM | POA: Diagnosis not present

## 2014-06-16 DIAGNOSIS — Z8659 Personal history of other mental and behavioral disorders: Secondary | ICD-10-CM | POA: Diagnosis not present

## 2014-06-16 DIAGNOSIS — R42 Dizziness and giddiness: Secondary | ICD-10-CM | POA: Diagnosis not present

## 2014-06-16 DIAGNOSIS — H9312 Tinnitus, left ear: Secondary | ICD-10-CM

## 2014-06-16 DIAGNOSIS — H9319 Tinnitus, unspecified ear: Secondary | ICD-10-CM | POA: Insufficient documentation

## 2014-06-16 DIAGNOSIS — E039 Hypothyroidism, unspecified: Secondary | ICD-10-CM | POA: Diagnosis not present

## 2014-06-16 DIAGNOSIS — I10 Essential (primary) hypertension: Secondary | ICD-10-CM | POA: Insufficient documentation

## 2014-06-16 DIAGNOSIS — Z8719 Personal history of other diseases of the digestive system: Secondary | ICD-10-CM | POA: Insufficient documentation

## 2014-06-16 LAB — URINALYSIS, ROUTINE W REFLEX MICROSCOPIC
Bilirubin Urine: NEGATIVE
Glucose, UA: NEGATIVE mg/dL
Hgb urine dipstick: NEGATIVE
Ketones, ur: NEGATIVE mg/dL
LEUKOCYTES UA: NEGATIVE
NITRITE: NEGATIVE
PROTEIN: NEGATIVE mg/dL
Specific Gravity, Urine: 1.01 (ref 1.005–1.030)
UROBILINOGEN UA: 0.2 mg/dL (ref 0.0–1.0)
pH: 7 (ref 5.0–8.0)

## 2014-06-16 LAB — COMPREHENSIVE METABOLIC PANEL
ALBUMIN: 3.9 g/dL (ref 3.5–5.2)
ALT: 13 U/L (ref 0–35)
ANION GAP: 13 (ref 5–15)
AST: 18 U/L (ref 0–37)
Alkaline Phosphatase: 95 U/L (ref 39–117)
BUN: 13 mg/dL (ref 6–23)
CALCIUM: 9.6 mg/dL (ref 8.4–10.5)
CO2: 25 mEq/L (ref 19–32)
Chloride: 102 mEq/L (ref 96–112)
Creatinine, Ser: 0.6 mg/dL (ref 0.50–1.10)
GFR calc non Af Amer: >90 mL/min (ref >90)
GLUCOSE: 112 mg/dL — AB (ref 70–99)
Potassium: 4 mEq/L (ref 3.7–5.3)
SODIUM: 140 meq/L (ref 137–147)
TOTAL PROTEIN: 7.6 g/dL (ref 6.0–8.3)
Total Bilirubin: 0.2 mg/dL — ABNORMAL LOW (ref 0.3–1.2)

## 2014-06-16 LAB — CBC WITH DIFFERENTIAL/PLATELET
BASOS PCT: 1 % (ref 0–1)
Basophils Absolute: 0 10*3/uL (ref 0.0–0.1)
EOS ABS: 0.1 10*3/uL (ref 0.0–0.7)
EOS PCT: 2 % (ref 0–5)
HCT: 41.5 % (ref 36.0–46.0)
Hemoglobin: 13.9 g/dL (ref 12.0–15.0)
LYMPHS ABS: 0.8 10*3/uL (ref 0.7–4.0)
Lymphocytes Relative: 17 % (ref 12–46)
MCH: 30.8 pg (ref 26.0–34.0)
MCHC: 33.5 g/dL (ref 30.0–36.0)
MCV: 92 fL (ref 78.0–100.0)
MONOS PCT: 7 % (ref 3–12)
Monocytes Absolute: 0.3 10*3/uL (ref 0.1–1.0)
Neutro Abs: 3.3 10*3/uL (ref 1.7–7.7)
Neutrophils Relative %: 73 % (ref 43–77)
PLATELETS: 236 10*3/uL (ref 150–400)
RBC: 4.51 MIL/uL (ref 3.87–5.11)
RDW: 13.5 % (ref 11.5–15.5)
WBC: 4.4 10*3/uL (ref 4.0–10.5)

## 2014-06-16 LAB — TROPONIN I

## 2014-06-16 NOTE — ED Notes (Signed)
Patient states that she has constant ringing and hearing loss in her right ear, but this is not new. It has been going on for some time. Tonight it was the "buzzing" in her left ear that was alarming. It has stopped now. Patient is no obvious distress at this time.

## 2014-06-16 NOTE — ED Notes (Signed)
Per EMS report: pt from home: pt woke up around 02:30 and felt hot and had a buzzing in her left ear.  Pt has tinnitus in her right ear. Pt denies any pain or other symptoms. Skin warm and dry. Pt ambulatory. Pt a/o x 4.  NAD noted.

## 2014-06-16 NOTE — ED Notes (Signed)
Pt reports she felt like she was going to pass out earlier but that symptom has improved since arriving to the ED.

## 2014-06-16 NOTE — ED Provider Notes (Signed)
CSN: [id number]     Arrival date & time 06/16/14  4818 History   First MD Initiated Contact with Patient 06/16/14 [phone]     Chief Complaint  Patient presents with  . Dizziness     (Consider location/radiation/quality/duration/timing/severity/associated sxs/prior Treatment) HPI Patient states she woke up around 2:30 in and felt flushed. She had a buzzing in her left ear. She then became lightheaded. She states she felt like she might pass out. She had no vertiginous sensations. She took her Antivert. EMS was called and patient transported. She states she is feeling much better at present. No longer has any buzzing in her year. She has no lightheadedness. She denies any chest pain or shortness of breath at any point. She denies any vision changes. She had no focal weakness or numbness. Denies any nausea or vomiting. Past Medical History  Diagnosis Date  . Hypothyroid   . Anxiety   . Breast CA     s/p lumpectomy and radiation 2000  . HTN (hypertension)     "mild hypertension", was on BP med years ago but it caused orthostatic hypotension and she has not been medicated since  . Colitis     2007   Past Surgical History  Procedure Laterality Date  . Breast lumpectomy      2000  . Hernia repair      20+ years ago  . Abdominal hysterectomy      43yrs ago   Family History  Problem Relation Age of Onset  . Other      Denies family h/o cardiac disease   History  Substance Use Topics  . Smoking status: Never Smoker   . Smokeless tobacco: Not on file  . Alcohol Use: No   OB History   Grav Para Term Preterm Abortions TAB SAB Ect Mult Living                 Review of Systems  Constitutional: Negative for fever and chills.  Respiratory: Negative for chest tightness and shortness of breath.   Cardiovascular: Negative for chest pain, palpitations and leg swelling.  Gastrointestinal: Negative for nausea, vomiting, abdominal pain, diarrhea and constipation.  Genitourinary: Negative  for dysuria.  Musculoskeletal: Negative for back pain, neck pain and neck stiffness.  Skin: Negative for rash and wound.  Neurological: Positive for dizziness and light-headedness. Negative for syncope, weakness, numbness and headaches.  All other systems reviewed and are negative.     Allergies  Codeine  Home Medications   Prior to Admission medications   Medication Sig Start Date End Date Taking? Authorizing Provider  Aspirin-Acetaminophen-Caffeine (GOODY HEADACHE PO) Take 1 packet by mouth 2 (two) times daily as needed. For headache   Yes Historical Provider, MD  citalopram (CELEXA) 20 MG tablet Take 20 mg by mouth at bedtime.   Yes Historical Provider, MD  levothyroxine (SYNTHROID, LEVOTHROID) 50 MCG tablet Take 50 mcg by mouth every morning.   Yes Historical Provider, MD  meclizine (ANTIVERT) 25 MG tablet Take 25 mg by mouth every 6 (six) hours as needed for dizziness.   Yes Historical Provider, MD   BP 148/74  Pulse 77  Temp(Src) 98 F (36.7 C) (Oral)  Resp 18  SpO2 98% Physical Exam  Nursing note and vitals reviewed. Constitutional: She is oriented to person, place, and time. She appears well-developed and well-nourished. No distress.  HENT:  Head: Normocephalic and atraumatic.  Right Ear: External ear normal.  Left Ear: External ear normal.  Mouth/Throat: Oropharynx is clear  and moist.  Eyes: EOM are normal. Pupils are equal, round, and reactive to light.  No nystagmus  Neck: Normal range of motion. Neck supple.  No meningismus  Cardiovascular: Normal rate and regular rhythm.  Exam reveals no gallop and no friction rub.   No murmur heard. Pulmonary/Chest: Effort normal and breath sounds normal. No respiratory distress. She has no wheezes. She has no rales. She exhibits no tenderness.  Abdominal: Soft. Bowel sounds are normal. She exhibits no distension and no mass. There is no tenderness. There is no rebound and no guarding.  Musculoskeletal: Normal range of motion.  She exhibits no edema and no tenderness.  No calf swelling or tenderness.  Neurological: She is alert and oriented to person, place, and time.  Patient is alert and oriented x3 with clear, goal oriented speech. Patient has 5/5 motor in all extremities. Sensation is intact to light touch. Bilateral finger-to-nose is normal with no signs of dysmetria. Patient has a normal gait and walks without assistance.   Skin: Skin is warm and dry. No rash noted. No erythema.  Psychiatric: She has a normal mood and affect. Her behavior is normal.    ED Course  Procedures (including critical care time) Labs Review Labs Reviewed  CBC WITH DIFFERENTIAL  COMPREHENSIVE METABOLIC PANEL  TROPONIN I  URINALYSIS, ROUTINE W REFLEX MICROSCOPIC    Imaging Review No results found.   EKG Interpretation   Date/Time:  Friday June 16 2014 05:27:36 EDT Ventricular Rate:  74 PR Interval:  170 QRS Duration: 83 QT Interval:  396 QTC Calculation: 439 R Axis:   67 Text Interpretation:  Sinus rhythm Baseline wander in lead(s) V6 Confirmed  by Lita Mains  MD, Pedro Oldenburg ([id number]) on 06/16/2014 6:11:23 AM      MDM   Final diagnoses:  None   Patient remains asymptomatic in the emergency department. She has a normal neurologic exam. EKG without acute findings. No significant orthostatic changes. Patient advised to drink clear fluids, change position slowly and followup with her primary Dr. Return precautions given.     Julianne Rice, MD 06/16/14 [phone]

## 2014-06-16 NOTE — Discharge Instructions (Signed)

## 2014-07-17 ENCOUNTER — Ambulatory Visit (INDEPENDENT_AMBULATORY_CARE_PROVIDER_SITE_OTHER): Payer: Medicare Other | Admitting: Ophthalmology

## 2014-07-17 DIAGNOSIS — H33309 Unspecified retinal break, unspecified eye: Secondary | ICD-10-CM

## 2014-07-17 DIAGNOSIS — H43399 Other vitreous opacities, unspecified eye: Secondary | ICD-10-CM

## 2014-12-14 ENCOUNTER — Other Ambulatory Visit: Payer: Self-pay

## 2014-12-14 DIAGNOSIS — Z1231 Encounter for screening mammogram for malignant neoplasm of breast: Secondary | ICD-10-CM

## 2015-01-17 ENCOUNTER — Ambulatory Visit: Payer: Medicare Other

## 2015-01-19 ENCOUNTER — Ambulatory Visit
Admission: RE | Admit: 2015-01-19 | Discharge: 2015-01-19 | Disposition: A | Discharge status: Home / Self Care | Payer: Medicare Other | Source: Ambulatory Visit

## 2015-01-19 DIAGNOSIS — Z1231 Encounter for screening mammogram for malignant neoplasm of breast: Secondary | ICD-10-CM

## 2015-04-10 ENCOUNTER — Other Ambulatory Visit: Payer: Self-pay | Admitting: Internal Medicine

## 2015-04-10 DIAGNOSIS — E2839 Other primary ovarian failure: Secondary | ICD-10-CM

## 2015-07-19 ENCOUNTER — Ambulatory Visit (INDEPENDENT_AMBULATORY_CARE_PROVIDER_SITE_OTHER): Payer: Medicare Other | Admitting: Ophthalmology

## 2015-08-10 ENCOUNTER — Ambulatory Visit (INDEPENDENT_AMBULATORY_CARE_PROVIDER_SITE_OTHER): Payer: Medicare HMO | Admitting: Ophthalmology

## 2015-08-10 DIAGNOSIS — H33303 Unspecified retinal break, bilateral: Secondary | ICD-10-CM | POA: Diagnosis not present

## 2015-08-10 DIAGNOSIS — H43813 Vitreous degeneration, bilateral: Secondary | ICD-10-CM

## 2015-08-10 DIAGNOSIS — H5213 Myopia, bilateral: Secondary | ICD-10-CM

## 2015-10-19 ENCOUNTER — Encounter (HOSPITAL_COMMUNITY): Payer: Self-pay | Admitting: Emergency Medicine

## 2015-10-19 ENCOUNTER — Emergency Department (HOSPITAL_COMMUNITY)
Admission: EM | Admit: 2015-10-19 | Discharge: 2015-10-19 | Disposition: A | Discharge status: Home / Self Care | Payer: Commercial Managed Care - HMO | Attending: Emergency Medicine | Admitting: Emergency Medicine

## 2015-10-19 DIAGNOSIS — Z8719 Personal history of other diseases of the digestive system: Secondary | ICD-10-CM | POA: Diagnosis not present

## 2015-10-19 DIAGNOSIS — F419 Anxiety disorder, unspecified: Secondary | ICD-10-CM | POA: Diagnosis not present

## 2015-10-19 DIAGNOSIS — Z79899 Other long term (current) drug therapy: Secondary | ICD-10-CM | POA: Insufficient documentation

## 2015-10-19 DIAGNOSIS — R42 Dizziness and giddiness: Secondary | ICD-10-CM

## 2015-10-19 DIAGNOSIS — I10 Essential (primary) hypertension: Secondary | ICD-10-CM | POA: Diagnosis not present

## 2015-10-19 DIAGNOSIS — Z853 Personal history of malignant neoplasm of breast: Secondary | ICD-10-CM | POA: Insufficient documentation

## 2015-10-19 DIAGNOSIS — E039 Hypothyroidism, unspecified: Secondary | ICD-10-CM | POA: Insufficient documentation

## 2015-10-19 HISTORY — DX: Dizziness and giddiness: R42

## 2015-10-19 LAB — I-STAT TROPONIN, ED: Troponin i, poc: 0 ng/mL (ref 0.00–0.08)

## 2015-10-19 LAB — URINALYSIS, ROUTINE W REFLEX MICROSCOPIC
Bilirubin Urine: NEGATIVE
GLUCOSE, UA: NEGATIVE mg/dL
HGB URINE DIPSTICK: NEGATIVE
Ketones, ur: NEGATIVE mg/dL
LEUKOCYTES UA: NEGATIVE
Nitrite: NEGATIVE
PH: 7 (ref 5.0–8.0)
Protein, ur: NEGATIVE mg/dL
SPECIFIC GRAVITY, URINE: 1.005 (ref 1.005–1.030)

## 2015-10-19 LAB — PROTIME-INR
INR: 1 (ref 0.00–1.49)
Prothrombin Time: 13.4 seconds (ref 11.6–15.2)

## 2015-10-19 LAB — CBC WITH DIFFERENTIAL/PLATELET
BASOS PCT: 1 %
Basophils Absolute: 0 10*3/uL (ref 0.0–0.1)
EOS ABS: 0.1 10*3/uL (ref 0.0–0.7)
Eosinophils Relative: 3 %
HEMATOCRIT: 43.8 % (ref 36.0–46.0)
Hemoglobin: 14.3 g/dL (ref 12.0–15.0)
LYMPHS ABS: 1.1 10*3/uL (ref 0.7–4.0)
Lymphocytes Relative: 25 %
MCH: 30.6 pg (ref 26.0–34.0)
MCHC: 32.6 g/dL (ref 30.0–36.0)
MCV: 93.6 fL (ref 78.0–100.0)
MONO ABS: 0.3 10*3/uL (ref 0.1–1.0)
MONOS PCT: 8 %
NEUTROS ABS: 2.6 10*3/uL (ref 1.7–7.7)
Neutrophils Relative %: 63 %
Platelets: 247 10*3/uL (ref 150–400)
RBC: 4.68 MIL/uL (ref 3.87–5.11)
RDW: 13.5 % (ref 11.5–15.5)
WBC: 4.2 10*3/uL (ref 4.0–10.5)

## 2015-10-19 LAB — BASIC METABOLIC PANEL
Anion gap: 6 (ref 5–15)
BUN: 13 mg/dL (ref 6–20)
CALCIUM: 9.6 mg/dL (ref 8.9–10.3)
CO2: 29 mmol/L (ref 22–32)
CREATININE: 0.64 mg/dL (ref 0.44–1.00)
Chloride: 105 mmol/L (ref 101–111)
GFR calc non Af Amer: >60 mL/min (ref >60)
GLUCOSE: 110 mg/dL — AB (ref 65–99)
Potassium: 3.3 mmol/L — ABNORMAL LOW (ref 3.5–5.1)
Sodium: 140 mmol/L (ref 135–145)

## 2015-10-19 MED ORDER — SODIUM CHLORIDE 0.9 % IV SOLN
INTRAVENOUS | Status: DC
  Administered 2015-10-19: 125 mL/h via INTRAVENOUS

## 2015-10-19 NOTE — ED Provider Notes (Signed)
CSN: [id number]     Arrival date & time 10/19/15  [id number] History   First MD Initiated Contact with Patient 10/19/15 [phone]     Chief Complaint  Patient presents with  . Dizziness     HPI Patient presents to the emergency room for evaluation of dizziness. Patient has a history of vertigo. She started having trouble with dizziness this morning. She felt lightheaded as if she was kind of passed out when she was sitting or standing. She did not feel that the room is specifically spinning. This did feel little bit different than her vertigo but she does notice that when she moves her head around her symptoms do get worse. She denies any trouble with her speech. No focal numbness or weakness. No visual symptoms.  Patient also has noticed intermittent trouble with pain in her right lower abdomen and back. It sometimes goes down her leg. Certain activity does make it worse. She denies any trouble with vomiting or diarrhea. No fevers or chills. She denies any trouble with her appetite. No dysuria.  Off-and-on she has noticed some small streaks of blood in her stool. She denies any trouble with any abdominal pain today. Past Medical History  Diagnosis Date  . Hypothyroid   . Anxiety   . Breast CA ([place])     s/p lumpectomy and radiation 2000  . HTN (hypertension)     "mild hypertension", was on BP med years ago but it caused orthostatic hypotension and she has not been medicated since  . Colitis     2007  . Vertigo    Past Surgical History  Procedure Laterality Date  . Breast lumpectomy      2000  . Hernia repair      20+ years ago  . Abdominal hysterectomy      56yrs ago   Family History  Problem Relation Age of Onset  . Other      Denies family h/o cardiac disease   Social History  Substance Use Topics  . Smoking status: Never Smoker   . Smokeless tobacco: None  . Alcohol Use: No   OB History    No data available     Review of Systems  All other systems reviewed and are  negative.     Allergies  Codeine  Home Medications   Prior to Admission medications   Medication Sig Start Date End Date Taking? Authorizing Provider  Aspirin-Acetaminophen-Caffeine (GOODY HEADACHE PO) Take 1 packet by mouth 2 (two) times daily as needed. For headache   Yes Historical Provider, MD  citalopram (CELEXA) 20 MG tablet Take 20 mg by mouth at bedtime.   Yes Historical Provider, MD  ibuprofen (ADVIL,MOTRIN) 200 MG tablet Take 400 mg by mouth every 6 (six) hours as needed (pain).   Yes Historical Provider, MD  levothyroxine (SYNTHROID, LEVOTHROID) 50 MCG tablet Take 50 mcg by mouth every morning.   Yes Historical Provider, MD  meclizine (ANTIVERT) 25 MG tablet Take 25 mg by mouth every 6 (six) hours as needed for dizziness.   Yes Historical Provider, MD   BP 142/65 mmHg  Pulse 71  Temp(Src) 97.9 F (36.6 C)  Resp 18  Ht 5\' 4"  (1.626 m)  Wt 64.864 kg  BMI 24.53 kg/m2  SpO2 98% Physical Exam  Constitutional: She is oriented to person, place, and time. She appears well-developed and well-nourished. No distress.  HENT:  Head: Normocephalic and atraumatic.  Right Ear: External ear normal.  Left Ear: External ear normal.  Mouth/Throat: Oropharynx is clear and moist.  Eyes: Conjunctivae are normal. Right eye exhibits no discharge. Left eye exhibits no discharge. No scleral icterus.  Neck: Neck supple. No tracheal deviation present.  Cardiovascular: Normal rate, regular rhythm and intact distal pulses.   Pulmonary/Chest: Effort normal and breath sounds normal. No stridor. No respiratory distress. She has no wheezes. She has no rales.  Abdominal: Soft. Bowel sounds are normal. She exhibits no distension. There is no tenderness. There is no rebound and no guarding.  Musculoskeletal: She exhibits no edema or tenderness.  Neurological: She is alert and oriented to person, place, and time. She has normal strength. No cranial nerve deficit (No facial droop, extraocular movements  intact, tongue midline ) or sensory deficit. She exhibits normal muscle tone. She displays no seizure activity. Coordination normal.  No pronator drift bilateral upper extrem, able to hold both legs off bed for 5 seconds, sensation intact in all extremities, no visual field cuts, no left or right sided neglect, normal finger-nose exam bilaterally, no nystagmus noted, patient did complain of some vertiginous symptoms when looking at my finger as I moved it   Skin: Skin is warm and dry. No rash noted.  Psychiatric: She has a normal mood and affect.  Nursing note and vitals reviewed.   ED Course  Procedures (including critical care time) Labs Review Labs Reviewed  BASIC METABOLIC PANEL - Abnormal; Notable for the following:    Potassium 3.3 (*)    Glucose, Bld 110 (*)    All other components within normal limits  CBC WITH DIFFERENTIAL/PLATELET  PROTIME-INR  URINALYSIS, ROUTINE W REFLEX MICROSCOPIC (NOT AT [facility])  I-STAT TROPOININ, ED    Imaging Review No results found. I have personally reviewed and evaluated these images and lab results as part of my medical decision-making.   EKG Interpretation   Date/Time:  Friday October 19 2015 10:04:36 EST Ventricular Rate:  83 PR Interval:  165 QRS Duration: 129 QT Interval:  405 QTC Calculation: 476 R Axis:   49 Text Interpretation:  Sinus rhythm Probable left ventricular hypertrophy  Minimal ST elevation, lateral leads qrs widening since last tracing  lateral st changes since last tracing Confirmed by Layann Bluett  MD-J, Cahlil Sattar  [id number]) on 10/19/2015 10:14:55 AM      MDM   Final diagnoses:  Dizziness    Patient's laboratory tests are reassuring. She is not anemic or dehydrated.  Patient's neurologic exam is normal. She does not have any focal deficits. She does not have any ataxia. Patient's able to walk around the emergency room without difficulty.  I doubt that her dizziness is related to stroke. This may be related to her vertigo  issues in the past.  Regarding her abdominal complaints. The patient has no complaints today. The test results are reassuring.  These reasonable to follow up with her doctor this coming week. She should monitor for any worsening symptoms.    Dorie Rank, MD 10/19/15 [phone]

## 2015-10-19 NOTE — ED Notes (Signed)
Doctor at bedside.

## 2015-10-19 NOTE — ED Notes (Signed)
Onset today 0830 woke up 0600 developed dizziness history of vertigo and took home medication for dizziness and states some relief. Alert answering and following commands appropriate.

## 2015-10-19 NOTE — Discharge Instructions (Signed)
Dizziness Dizziness is a common problem. It is a feeling of unsteadiness or light-headedness. You may feel like you are about to faint. Dizziness can lead to injury if you stumble or fall. Anyone can become dizzy, but dizziness is more common in older adults. This condition can be caused by a number of things, including medicines, dehydration, or illness. HOME CARE INSTRUCTIONS Taking these steps may help with your condition: Eating and Drinking  Drink enough fluid to keep your urine clear or pale yellow. This helps to keep you from becoming dehydrated. Try to drink more clear fluids, such as water.  Do not drink alcohol.  Limit your caffeine intake if directed by your health care provider.  Limit your salt intake if directed by your health care provider. Activity  Avoid making quick movements.  Rise slowly from chairs and steady yourself until you feel okay.  In the morning, first sit up on the side of the bed. When you feel okay, stand slowly while you hold onto something until you know that your balance is fine.  Move your legs often if you need to stand in one place for a long time. Tighten and relax your muscles in your legs while you are standing.  Do not drive or operate heavy machinery if you feel dizzy.  Avoid bending down if you feel dizzy. Place items in your home so that they are easy for you to reach without leaning over. Lifestyle  Do not use any tobacco products, including cigarettes, chewing tobacco, or electronic cigarettes. If you need help quitting, ask your health care provider.  Try to reduce your stress level, such as with yoga or meditation. Talk with your health care provider if you need help. General Instructions  Watch your dizziness for any changes.  Take medicines only as directed by your health care provider. Talk with your health care provider if you think that your dizziness is caused by a medicine that you are taking.  Tell a friend or a family  member that you are feeling dizzy. If he or she notices any changes in your behavior, have this person call your health care provider.  Keep all follow-up visits as directed by your health care provider. This is important. SEEK MEDICAL CARE IF:  Your dizziness does not go away.  Your dizziness or light-headedness gets worse.  You feel nauseous.  You have reduced hearing.  You have new symptoms.  You are unsteady on your feet or you feel like the room is spinning. SEEK IMMEDIATE MEDICAL CARE IF:  You vomit or have diarrhea and are unable to eat or drink anything.  You have problems talking, walking, swallowing, or using your arms, hands, or legs.  You feel generally weak.  You are not thinking clearly or you have trouble forming sentences. It may take a friend or family member to notice this.  You have chest pain, abdominal pain, shortness of breath, or sweating.  Your vision changes.  You notice any bleeding.  You have a headache.  You have neck pain or a stiff neck.  You have a fever.   This information is not intended to replace advice given to you by your health care provider. Make sure you discuss any questions you have with your health care provider.   Document Released: 05/06/2001 Document Revised: 03/27/2015 Document Reviewed: 11/06/2014 Elsevier Interactive Patient Education 2016 Else[employer]  Dizziness Dizziness is a common problem. It is a feeling of unsteadiness or light-headedness. You may feel like  you are about to faint. Dizziness can lead to injury if you stumble or fall. Anyone can become dizzy, but dizziness is more common in older adults. This condition can be caused by a number of things, including medicines, dehydration, or illness. HOME CARE INSTRUCTIONS Taking these steps may help with your condition: Eating and Drinking  Drink enough fluid to keep your urine clear or pale yellow. This helps to keep you from becoming dehydrated. Try to drink  more clear fluids, such as water.  Do not drink alcohol.  Limit your caffeine intake if directed by your health care provider.  Limit your salt intake if directed by your health care provider. Activity  Avoid making quick movements.  Rise slowly from chairs and steady yourself until you feel okay.  In the morning, first sit up on the side of the bed. When you feel okay, stand slowly while you hold onto something until you know that your balance is fine.  Move your legs often if you need to stand in one place for a long time. Tighten and relax your muscles in your legs while you are standing.  Do not drive or operate heavy machinery if you feel dizzy.  Avoid bending down if you feel dizzy. Place items in your home so that they are easy for you to reach without leaning over. Lifestyle  Do not use any tobacco products, including cigarettes, chewing tobacco, or electronic cigarettes. If you need help quitting, ask your health care provider.  Try to reduce your stress level, such as with yoga or meditation. Talk with your health care provider if you need help. General Instructions  Watch your dizziness for any changes.  Take medicines only as directed by your health care provider. Talk with your health care provider if you think that your dizziness is caused by a medicine that you are taking.  Tell a friend or a family member that you are feeling dizzy. If he or she notices any changes in your behavior, have this person call your health care provider.  Keep all follow-up visits as directed by your health care provider. This is important. SEEK MEDICAL CARE IF:  Your dizziness does not go away.  Your dizziness or light-headedness gets worse.  You feel nauseous.  You have reduced hearing.  You have new symptoms.  You are unsteady on your feet or you feel like the room is spinning. SEEK IMMEDIATE MEDICAL CARE IF:  You vomit or have diarrhea and are unable to eat or drink  anything.  You have problems talking, walking, swallowing, or using your arms, hands, or legs.  You feel generally weak.  You are not thinking clearly or you have trouble forming sentences. It may take a friend or family member to notice this.  You have chest pain, abdominal pain, shortness of breath, or sweating.  Your vision changes.  You notice any bleeding.  You have a headache.  You have neck pain or a stiff neck.  You have a fever.   This information is not intended to replace advice given to you by your health care provider. Make sure you discuss any questions you have with your health care provider.   Document Released: 05/06/2001 Document Revised: 03/27/2015 Document Reviewed: 11/06/2014 Elsevier Interactive Patient Education [employer].

## 2015-11-20 ENCOUNTER — Encounter (HOSPITAL_COMMUNITY): Payer: Self-pay | Admitting: Emergency Medicine

## 2015-11-20 ENCOUNTER — Emergency Department (HOSPITAL_COMMUNITY)
Admission: EM | Admit: 2015-11-20 | Discharge: 2015-11-20 | Disposition: A | Discharge status: Home / Self Care | Payer: Commercial Managed Care - HMO | Attending: Physician Assistant | Admitting: Physician Assistant

## 2015-11-20 DIAGNOSIS — I10 Essential (primary) hypertension: Secondary | ICD-10-CM | POA: Diagnosis not present

## 2015-11-20 DIAGNOSIS — H9311 Tinnitus, right ear: Secondary | ICD-10-CM | POA: Insufficient documentation

## 2015-11-20 DIAGNOSIS — Z8719 Personal history of other diseases of the digestive system: Secondary | ICD-10-CM | POA: Diagnosis not present

## 2015-11-20 DIAGNOSIS — Z79899 Other long term (current) drug therapy: Secondary | ICD-10-CM | POA: Diagnosis not present

## 2015-11-20 DIAGNOSIS — E039 Hypothyroidism, unspecified: Secondary | ICD-10-CM | POA: Diagnosis not present

## 2015-11-20 DIAGNOSIS — R42 Dizziness and giddiness: Secondary | ICD-10-CM | POA: Insufficient documentation

## 2015-11-20 DIAGNOSIS — Z853 Personal history of malignant neoplasm of breast: Secondary | ICD-10-CM | POA: Insufficient documentation

## 2015-11-20 DIAGNOSIS — F419 Anxiety disorder, unspecified: Secondary | ICD-10-CM | POA: Diagnosis not present

## 2015-11-20 DIAGNOSIS — H9201 Otalgia, right ear: Secondary | ICD-10-CM | POA: Insufficient documentation

## 2015-11-20 NOTE — ED Provider Notes (Signed)
CSN: [id number]     Arrival date & time 11/20/15  1253 History  By signing my name below, I, Julie Rollins, attest that this documentation has been prepared under the direction and in the presence of Lily Kocher, PA-C Electronically Signed: Ladene Artist, ED Scribe 11/20/2015 at 4:21 PM.   Chief Complaint  Patient presents with  . Otalgia   Patient is a 69 y.o. female presenting with ear pain. The history is provided by the patient. No language interpreter was used.  Otalgia Location:  Right Severity:  Mild Onset quality:  Gradual Duration:  1 month Progression:  Worsening Chronicity:  Recurrent Context: not direct blow   Relieved by:  None tried Worsened by:  Swallowing Associated symptoms: tinnitus   Associated symptoms: no congestion, no ear discharge, no fever and no rhinorrhea    HPI Comments: Julie Rollins is a 69 y.o. female, with a h/o vertigo, who presents to the Emergency Department complaining of gradually worsening right ear pain for the past month. Pt reports associated intermittent dizziness and light-headedness last week that has resolved, right ear fullness, tinnitus for the past month, globulus sensation on the right with swallowing. No known injury. She has tried Advil without significant relief. She denies fever, trouble swallowing, ear drainage, congestion, rhinorrhea. Pt has been seen in the ED for the same last month.   Past Medical History  Diagnosis Date  . Hypothyroid   . Anxiety   . Breast CA ([place])     s/p lumpectomy and radiation 2000  . HTN (hypertension)     "mild hypertension", was on BP med years ago but it caused orthostatic hypotension and she has not been medicated since  . Colitis     2007  . Vertigo    Past Surgical History  Procedure Laterality Date  . Breast lumpectomy      2000  . Hernia repair      20+ years ago  . Abdominal hysterectomy      67yrs ago   Family History  Problem Relation Age of Onset  . Other      Denies  family h/o cardiac disease   Social History  Substance Use Topics  . Smoking status: Never Smoker   . Smokeless tobacco: None  . Alcohol Use: No   OB History    No data available     Review of Systems  Constitutional: Negative for fever.  HENT: Positive for ear pain and tinnitus. Negative for congestion, ear discharge, rhinorrhea and trouble swallowing.   Neurological: Positive for dizziness (resolved) and light-headedness (resolved).  All other systems reviewed and are negative.  Allergies  Codeine  Home Medications   Prior to Admission medications   Medication Sig Start Date End Date Taking? Authorizing Provider  Aspirin-Acetaminophen-Caffeine (GOODY HEADACHE PO) Take 1 packet by mouth 2 (two) times daily as needed. For headache    Historical Provider, MD  citalopram (CELEXA) 20 MG tablet Take 20 mg by mouth at bedtime.    Historical Provider, MD  ibuprofen (ADVIL,MOTRIN) 200 MG tablet Take 400 mg by mouth every 6 (six) hours as needed (pain).    Historical Provider, MD  levothyroxine (SYNTHROID, LEVOTHROID) 50 MCG tablet Take 50 mcg by mouth every morning.    Historical Provider, MD  meclizine (ANTIVERT) 25 MG tablet Take 25 mg by mouth every 6 (six) hours as needed for dizziness.    Historical Provider, MD   BP 164/79 mmHg  Pulse 94  Temp(Src) 97.7 F (36.5 C) (  Oral)  Resp 16  SpO2 95% Physical Exam  Constitutional: She is oriented to person, place, and time. She appears well-developed and well-nourished. No distress.  HENT:  Head: Normocephalic and atraumatic.  Right Ear: Tympanic membrane is not bulging.  Mouth/Throat: Oropharynx is clear and moist.  No pain, redness or swelling of the mastoid area. Mild fluid behind the R TM. No bulging of the TM.   Eyes: Conjunctivae and EOM are normal.  Neck: Neck supple. Carotid bruit is not present. No tracheal deviation present.  Cardiovascular: Normal rate, regular rhythm and normal heart sounds.   Pulmonary/Chest: Effort  normal and breath sounds normal. No respiratory distress.  Musculoskeletal: Normal range of motion.  Neurological: She is alert and oriented to person, place, and time. No cranial nerve deficit. She exhibits normal muscle tone. Coordination normal.  Good balance noted.  Skin: Skin is warm and dry.  Psychiatric: She has a normal mood and affect. Her behavior is normal.  Nursing note and vitals reviewed.  ED Course  Procedures (including critical care time) DIAGNOSTIC STUDIES: Oxygen Saturation is 95% on RA, adequate by my interpretation.    COORDINATION OF CARE: 4:07 PM-Discussed treatment plan which includes referral to ENT with pt at bedside and pt agreed to plan.   4:19 PM-Case discussed with Dr. Thomasene Lot.   Labs Review Labs Reviewed - No data to display  Imaging Review No results found. I have personally reviewed and evaluated these images and lab results as part of my medical decision-making.   EKG Interpretation None      MDM  Vital signs reviewed, non-acute. No ear drainage or hx of trauma. Hx of ear problems, and ringing in the past. No gross neuro deficits noted. No acute carotid bruits. Pt has problems with antivert and other meds prescribed. Pt reassured of current exam findings and vital signs. Pt referred to ENT for eval and management.   Final diagnoses:  Otalgia of right ear    *I have reviewed nursing notes, vital signs, and all appropriate lab and imaging results for this patient.**  **I personally performed the services described in this documentation, which was scribed in my presence. The recorded information has been reviewed and is accurate.Lily Kocher, PA-C 11/21/15 [place], MD 11/21/15 2359

## 2015-11-20 NOTE — ED Notes (Signed)
Pt reports R ear fullness and ringing for the past month.

## 2015-11-20 NOTE — Discharge Instructions (Signed)
Your vital signs are well within normal limits. No acute changes noted of your ear or throat. Please see Dr. Wilburn Cornelia, or the ear nose and throat specialist of your choice concerning the changes in your ear. Please return to the emergency department if any unusual drainage, bleeding, high fever, or other changes before seeing the ear nose and throat specialist. Earache An earache, also called otalgia, can be caused by many things. Pain from an earache can be sharp, dull, or burning. The pain may be temporary or constant. Earaches can be caused by problems with the ear, such as infection in either the middle ear or the ear canal, injury, impacted ear wax, middle ear pressure, or a foreign body in the ear. Ear pain can also result from problems in other areas. This is called referred pain. For example, pain can come from a sore throat, a tooth infection, or problems with the jaw or the joint between the jaw and the skull (temporomandibular joint, or TMJ). The cause of an earache is not always easy to identify. Watchful waiting may be appropriate for some earaches until a clear cause of the pain can be found. HOME CARE INSTRUCTIONS Watch your condition for any changes. The following actions may help to lessen any discomfort that you are feeling:  Take medicines only as directed by your health care provider. This includes ear drops.  Apply ice to your outer ear to help reduce pain.  Put ice in a plastic bag.  Place a towel between your skin and the bag.  Leave the ice on for 20 minutes, 2-3 times per day.  Do not put anything in your ear other than medicine that is prescribed by your health care provider.  Try resting in an upright position instead of lying down. This may help to reduce pressure in the middle ear and relieve pain.  Chew gum if it helps to relieve your ear pain.  Control any allergies that you have.  Keep all follow-up visits as directed by your health care provider. This is  important. SEEK MEDICAL CARE IF:  Your pain does not improve within 2 days.  You have a fever.  You have new or worsening symptoms. SEEK IMMEDIATE MEDICAL CARE IF:  You have a severe headache.  You have a stiff neck.  You have difficulty swallowing.  You have redness or swelling behind your ear.  You have drainage from your ear.  You have hearing loss.  You feel dizzy.   This information is not intended to replace advice given to you by your health care provider. Make sure you discuss any questions you have with your health care provider.   Document Released: 06/27/2004 Document Revised: 12/01/2014 Document Reviewed: 06/11/2014 Elsevier Interactive Patient Education [employer] .

## 2015-12-17 ENCOUNTER — Other Ambulatory Visit: Payer: Self-pay

## 2015-12-17 DIAGNOSIS — Z1231 Encounter for screening mammogram for malignant neoplasm of breast: Secondary | ICD-10-CM

## 2016-01-22 ENCOUNTER — Ambulatory Visit
Admission: RE | Admit: 2016-01-22 | Discharge: 2016-01-22 | Disposition: A | Discharge status: Home / Self Care | Payer: Commercial Managed Care - HMO | Source: Ambulatory Visit

## 2016-01-22 DIAGNOSIS — Z1231 Encounter for screening mammogram for malignant neoplasm of breast: Secondary | ICD-10-CM

## 2016-01-23 ENCOUNTER — Encounter: Payer: Self-pay | Admitting: Gastroenterology

## 2016-02-13 ENCOUNTER — Encounter: Payer: Self-pay | Admitting: Gastroenterology

## 2016-02-13 ENCOUNTER — Telehealth: Payer: Self-pay | Admitting: *Deleted

## 2016-02-13 NOTE — Telephone Encounter (Signed)
Pt came in for pre-visit for colonoscopy scheduled on 03/13/16, pt was very anxious and stated "why does she have to have a colonoscopy at 26yr", pt then went on to talk about how she had had flexsig w/out sedation years ago and then had one at [facility] in 2007 and had to have sedation, she was not happy about it, she also had a friend who had a colonoscopy that had a perforation, explained to pt that is rare but can happen and who is more susceptible, pt then wanted to know how long Dr Silverio Decamp has been practicing, I advised pt of her credentials. Pt is still very nervous to have colonoscopy and wants to know if there are any other options, she has no family hx of colon ca, advised pt I could schedule her an appointment with Dr Silverio Decamp so they could discuss any options she might have, pt was still very hesitant to go through with procedure, cancelled pre-visit and colonoscopy, scheduled New pt OV with Dr Silverio Decamp on 04/22/16 so she could speak with her directly-adm

## 2016-03-13 ENCOUNTER — Encounter: Payer: Commercial Managed Care - HMO | Admitting: Gastroenterology

## 2016-04-22 ENCOUNTER — Ambulatory Visit (INDEPENDENT_AMBULATORY_CARE_PROVIDER_SITE_OTHER): Payer: Medicare HMO | Admitting: Gastroenterology

## 2016-04-22 ENCOUNTER — Encounter: Payer: Self-pay | Admitting: Gastroenterology

## 2016-04-22 VITALS — BP 130/70 | HR 68 | Ht 64.0 in | Wt 142.0 lb

## 2016-04-22 DIAGNOSIS — K5909 Other constipation: Secondary | ICD-10-CM | POA: Diagnosis not present

## 2016-04-22 DIAGNOSIS — Z1211 Encounter for screening for malignant neoplasm of colon: Secondary | ICD-10-CM | POA: Diagnosis not present

## 2016-04-22 MED ORDER — NA SULFATE-K SULFATE-MG SULF 17.5-3.13-1.6 GM/177ML PO SOLN: 1.0000 | Freq: Once | ORAL | Status: DC

## 2016-04-22 NOTE — Patient Instructions (Signed)
You have been scheduled for a colonoscopy. Please follow written instructions given to you at your visit today.  Please pick up your prep supplies at the pharmacy within the next 1-3 days. If you use inhalers (even only as needed), please bring them with you on the day of your procedure. Your physician has requested that you go to www.startemmi.com and enter the access code given to you at your visit today. This web site gives a general overview about your procedure. However, you should still follow specific instructions given to you by our office regarding your preparation for the procedure.  Use Benefiber 1 tablespoon three times a day Use Miralax 1/2 capful daily

## 2016-04-22 NOTE — Progress Notes (Signed)
Julie Rollins    [id number]    [birth year]  Primary Care Physician:Edwin Bunnie Domino, MD  Referring Physician: Nolene Ebbs, MD [address] [place], [place] [address]  Chief complaint:  To discuss screening colonoscopy  HPI: 70 year old female is here to establish care and to discuss colonoscopy. She hasn't undergone screening for colorectal cancer and is apprehensive about undergoing colonoscopy. On review of system patient reported having chronic constipation with bowel movement every 2-3 days. She is currently not on any laxatives and has had to strain excessively with occasional prolapse of hemorrhoids that reduce spontaneously. No family history of colon cancer. Denies any nausea, vomiting, abdominal pain, melena or bright red blood per rectum    Outpatient Encounter Prescriptions as of 04/22/2016  Medication Sig  . citalopram (CELEXA) 20 MG tablet Take 20 mg by mouth at bedtime.  Marland Kitchen ibuprofen (ADVIL,MOTRIN) 200 MG tablet Take 400 mg by mouth every 6 (six) hours as needed (pain).  Marland Kitchen levothyroxine (SYNTHROID, LEVOTHROID) 50 MCG tablet Take 50 mcg by mouth every morning.  . meclizine (ANTIVERT) 25 MG tablet Take 25 mg by mouth every 6 (six) hours as needed for dizziness.  . [DISCONTINUED] Aspirin-Acetaminophen-Caffeine (GOODY HEADACHE PO) Take 1 packet by mouth 2 (two) times daily as needed. For headache  . Na Sulfate-K Sulfate-Mg Sulf (SUPREP BOWEL PREP KIT) 17.5-3.13-1.6 GM/180ML SOLN Take 1 kit by mouth once.   No facility-administered encounter medications on file as of 04/22/2016.    Allergies as of 04/22/2016 - Review Complete 04/22/2016  Allergen Reaction Noted  . Codeine Nausea Only 02/12/2012    Past Medical History  Diagnosis Date  . Hypothyroid   . Anxiety   . Breast CA ([place])     s/p lumpectomy and radiation 2000  . HTN (hypertension)     "mild hypertension", was on BP med years ago but it caused orthostatic hypotension and she has not been medicated  since  . Colitis     2007  . Vertigo     Past Surgical History  Procedure Laterality Date  . Breast lumpectomy      2000  . Hernia repair      20+ years ago  . Abdominal hysterectomy      33yr ago    Family History  Problem Relation Age of Onset  . Other      Denies family h/o cardiac disease    Social History   Social History  . Marital Status: Divorced    Spouse Name: N/A  . Number of Children: N/A  . Years of Education: N/A   Occupational History  . retired     [employer]   Social History Main Topics  . Smoking status: Never Smoker   . Smokeless tobacco: Not on file  . Alcohol Use: No  . Drug Use: No  . Sexual Activity: Not on file   Other Topics Concern  . Not on file   Social History Narrative   Lives alone      Review of systems: Review of Systems  Constitutional: Negative for fever and chills.  HENT: Negative.   Eyes: Negative for blurred vision.  Respiratory: Negative for cough, shortness of breath and wheezing.   Cardiovascular: Negative for chest pain and palpitations.  Gastrointestinal: as per HPI Genitourinary: Negative for dysuria, urgency, frequency and hematuria.  Musculoskeletal: Negative for myalgias, back pain and joint pain.  Skin: Negative for itching and rash.  Neurological: Negative  for dizziness, tremors, focal weakness, seizures and loss of consciousness.  Endo/Heme/Allergies: Negative for environmental allergies.  Psychiatric/Behavioral: Negative for depression, suicidal ideas and hallucinations.  All other systems reviewed and are negative.   Physical Exam: Filed Vitals:   04/22/16 0829  BP: 130/70  Pulse: 68   Gen:      No acute distress HEENT:  EOMI, sclera anicteric Neck:     No masses; no thyromegaly Lungs:    Clear to auscultation bilaterally; normal respiratory effort CV:         Regular rate and rhythm; no murmurs Abd:      + bowel sounds; soft, non-tender; no palpable masses, no distension Ext:    No  edema; adequate peripheral perfusion Skin:      Warm and dry; no rash Neuro: alert and oriented x 3 Psych: normal mood and affect  Data Reviewed:  Reviewed chart in epic   Assessment and Plan/Recommendations:  70 year old female here to discuss screening for colorectal cancer. The risks and benefits as well as alternatives of endoscopic procedure(s) have been discussed and reviewed. All questions answered. The patient agrees to proceed with colonoscopy Start Benefiber 1 tablespoon 3 times a day and MiraLAX half capful daily to have 1soft bowel movements a day Return as needed  K. Denzil Magnuson , MD [phone] Mon-Fri 8a-5p [phone] after 5p, weekends, holidays  CC: Nolene Ebbs, MD

## 2016-05-02 ENCOUNTER — Ambulatory Visit (AMBULATORY_SURGERY_CENTER): Payer: Medicare HMO | Admitting: Gastroenterology

## 2016-05-02 ENCOUNTER — Encounter: Payer: Self-pay | Admitting: Gastroenterology

## 2016-05-02 VITALS — BP 183/76 | HR 63 | Temp 98.9°F | Resp 13 | Ht 64.0 in | Wt 142.0 lb

## 2016-05-02 DIAGNOSIS — Z1211 Encounter for screening for malignant neoplasm of colon: Secondary | ICD-10-CM | POA: Diagnosis present

## 2016-05-02 MED ORDER — SODIUM CHLORIDE 0.9 % IV SOLN: 500.0000 mL | INTRAVENOUS | Status: DC

## 2016-05-02 NOTE — Progress Notes (Signed)
Report to PACU, RN, vss, BBS= Clear.  

## 2016-05-02 NOTE — Patient Instructions (Signed)
YOU HAD AN ENDOSCOPIC PROCEDURE TODAY AT THE [facility]:   Refer to the procedure report that was given to you for any specific questions about what was found during the examination.  If the procedure report does not answer your questions, please call your gastroenterologist to clarify.  If you requested that your care partner not be given the details of your procedure findings, then the procedure report has been included in a sealed envelope for you to review at your convenience later.  YOU SHOULD EXPECT: Some feelings of bloating in the abdomen. Passage of more gas than usual.  Walking can help get rid of the air that was put into your GI tract during the procedure and reduce the bloating. If you had a lower endoscopy (such as a colonoscopy or flexible sigmoidoscopy) you may notice spotting of blood in your stool or on the toilet paper. If you underwent a bowel prep for your procedure, you may not have a normal bowel movement for a few days.  Please Note:  You might notice some irritation and congestion in your nose or some drainage.  This is from the oxygen used during your procedure.  There is no need for concern and it should clear up in a day or so.  SYMPTOMS TO REPORT IMMEDIATELY:   Following lower endoscopy (colonoscopy or flexible sigmoidoscopy):  Excessive amounts of blood in the stool  Significant tenderness or worsening of abdominal pains  Swelling of the abdomen that is new, acute  Fever of 100F or higher    For urgent or emergent issues, a gastroenterologist can be reached at any hour by calling [phone].   DIET: Your first meal following the procedure should be a small meal and then it is ok to progress to your normal diet. Heavy or fried foods are harder to digest and may make you feel nauseous or bloated.  Likewise, meals heavy in dairy and vegetables can increase bloating.  Drink plenty of fluids but you should avoid alcoholic beverages for 24  hours.  ACTIVITY:  You should plan to take it easy for the rest of today and you should NOT DRIVE or use heavy machinery until tomorrow (because of the sedation medicines used during the test).    FOLLOW UP: Our staff will call the number listed on your records the next business day following your procedure to check on you and address any questions or concerns that you may have regarding the information given to you following your procedure. If we do not reach you, we will leave a message.  However, if you are feeling well and you are not experiencing any problems, there is no need to return our call.  We will assume that you have returned to your regular daily activities without incident.  If any biopsies were taken you will be contacted by phone or by letter within the next 1-3 weeks.  Please call us at [phone] if you have not heard about the biopsies in 3 weeks.    SIGNATURES/CONFIDENTIALITY: You and/or your care partner have signed paperwork which will be entered into your electronic medical record.  These signatures attest to the fact that that the information above on your After Visit Summary has been reviewed and is understood.  Full responsibility of the confidentiality of this discharge information lies with you and/or your care-partner.   Information on diverticulosis & hemorrhoids given to you today 

## 2016-05-02 NOTE — Op Note (Signed)
[facility] Patient Name: Julie Rollins Procedure Date: 05/02/2016 8:57 AM MRN: [id number] Endoscopist: Mauri Pole , MD Age: 70 Referring MD:  Date of Birth: December 06, 1945 Gender: Female Procedure:                Colonoscopy Indications:              Screening for colorectal malignant neoplasm, This                            is the patient's first colonoscopy Medicines:                Monitored Anesthesia Care Procedure:                Pre-Anesthesia Assessment:                           - Prior to the procedure, a History and Physical                            was performed, and patient medications and                            allergies were reviewed. The patient's tolerance of                            previous anesthesia was also reviewed. The risks                            and benefits of the procedure and the sedation                            options and risks were discussed with the patient.                            All questions were answered, and informed consent                            was obtained. Prior Anticoagulants: The patient has                            taken no previous anticoagulant or antiplatelet                            agents. ASA Grade Assessment: II - A patient with                            mild systemic disease. After reviewing the risks                            and benefits, the patient was deemed in                            satisfactory condition to undergo the procedure.  After obtaining informed consent, the colonoscope                            was passed under direct vision. Throughout the                            procedure, the patient's blood pressure, pulse, and                            oxygen saturations were monitored continuously. The                            Model CF-HQ190L [id number]) scope was introduced                            through the anus and advanced to the the  cecum,                            identified by appendiceal orifice and ileocecal                            valve. The colonoscopy was technically difficult                            and complex due to multiple diverticula in the                            colon, restricted mobility of the colon and a                            tortuous colon. Successful completion of the                            procedure was aided by changing the patient to a                            prone position and withdrawing the scope and                            replacing with the pediatric colonoscope. The                            patient tolerated the procedure well. The quality                            of the bowel preparation was good. The ileocecal                            valve, appendiceal orifice, and rectum were                            photographed. Scope In: 9:03:50 AM Scope Out: 9:29:03 AM Scope Withdrawal Time: 0 hours 4 minutes 38 seconds  Total Procedure Duration:  0 hours 25 minutes 13 seconds  Findings:                 The perianal and digital rectal examinations were                            normal.                           Multiple small and large-mouthed diverticula were                            found in the sigmoid colon and descending colon.                           Non-bleeding internal hemorrhoids were found during                            retroflexion. The hemorrhoids were medium-sized.                           The exam was otherwise without abnormality. Complications:            No immediate complications. Estimated Blood Loss:     Estimated blood loss: none. Impression:               - Diverticulosis in the sigmoid colon and in the                            descending colon.                           - Non-bleeding internal hemorrhoids.                           - The examination was otherwise normal.                           - No specimens  collected. Recommendation:           - Patient has a contact number available for                            emergencies. The signs and symptoms of potential                            delayed complications were discussed with the                            patient. Return to normal activities tomorrow.                            Written discharge instructions were provided to the                            patient.                           -  Resume previous diet.                           - Continue present medications.                           - No repeat colonoscopy due to age and the absence                            of colonic polyps.                           - Return to GI clinic PRN. Mauri Pole, MD 05/02/2016 9:38:25 AM This report has been signed electronically.

## 2016-05-05 ENCOUNTER — Telehealth: Payer: Self-pay | Admitting: *Deleted

## 2016-05-05 NOTE — Telephone Encounter (Signed)
  Follow up Call-  Call back number 05/02/2016  Post procedure Call Back phone  # [phone]  Permission to leave phone message No     Patient questions:  Do you have a fever, pain , or abdominal swelling? No. Pain Score  0 *  Have you tolerated food without any problems? Yes.    Have you been able to return to your normal activities? Yes.    Do you have any questions about your discharge instructions: Diet   No. Medications  No. Follow up visit  No.  Do you have questions or concerns about your Care? No.  Actions: * If pain score is 4 or above: No action needed, pain <4.  Pt c/o "diarrhea" on Saturday, but states she did have a "soft" BM on Saturday and none since then. Encouraged patient to keep eye on this,and to get back to eating and drinking as before colon exam. Patient denies any other problems and denies any blood in stool. Encouraged patient to call us back if needed. She verbalizes understanding.

## 2016-08-11 ENCOUNTER — Ambulatory Visit (INDEPENDENT_AMBULATORY_CARE_PROVIDER_SITE_OTHER): Payer: Medicare HMO | Admitting: Ophthalmology

## 2016-08-11 DIAGNOSIS — H43813 Vitreous degeneration, bilateral: Secondary | ICD-10-CM

## 2016-08-11 DIAGNOSIS — H40013 Open angle with borderline findings, low risk, bilateral: Secondary | ICD-10-CM | POA: Diagnosis not present

## 2016-08-11 DIAGNOSIS — H2513 Age-related nuclear cataract, bilateral: Secondary | ICD-10-CM

## 2016-08-11 DIAGNOSIS — H33303 Unspecified retinal break, bilateral: Secondary | ICD-10-CM

## 2016-08-11 DIAGNOSIS — H5213 Myopia, bilateral: Secondary | ICD-10-CM

## 2016-12-24 ENCOUNTER — Other Ambulatory Visit: Payer: Self-pay | Admitting: Internal Medicine

## 2016-12-24 DIAGNOSIS — Z1231 Encounter for screening mammogram for malignant neoplasm of breast: Secondary | ICD-10-CM

## 2017-01-26 ENCOUNTER — Ambulatory Visit
Admission: RE | Admit: 2017-01-26 | Discharge: 2017-01-26 | Disposition: A | Discharge status: Home / Self Care | Payer: Medicare HMO | Source: Ambulatory Visit | Attending: Internal Medicine | Admitting: Internal Medicine

## 2017-01-26 DIAGNOSIS — Z1231 Encounter for screening mammogram for malignant neoplasm of breast: Secondary | ICD-10-CM

## 2017-01-26 HISTORY — DX: Personal history of irradiation: Z92.3

## 2017-01-29 ENCOUNTER — Other Ambulatory Visit: Payer: Self-pay | Admitting: Internal Medicine

## 2017-01-29 DIAGNOSIS — E2839 Other primary ovarian failure: Secondary | ICD-10-CM

## 2017-08-11 ENCOUNTER — Ambulatory Visit (INDEPENDENT_AMBULATORY_CARE_PROVIDER_SITE_OTHER): Payer: Medicare HMO | Admitting: Ophthalmology

## 2017-08-11 DIAGNOSIS — H43813 Vitreous degeneration, bilateral: Secondary | ICD-10-CM | POA: Diagnosis not present

## 2017-08-11 DIAGNOSIS — H33303 Unspecified retinal break, bilateral: Secondary | ICD-10-CM | POA: Diagnosis not present

## 2017-08-11 DIAGNOSIS — H2513 Age-related nuclear cataract, bilateral: Secondary | ICD-10-CM | POA: Diagnosis not present

## 2017-12-18 ENCOUNTER — Other Ambulatory Visit: Payer: Self-pay | Admitting: Internal Medicine

## 2017-12-18 DIAGNOSIS — Z1231 Encounter for screening mammogram for malignant neoplasm of breast: Secondary | ICD-10-CM

## 2018-01-27 ENCOUNTER — Ambulatory Visit
Admission: RE | Admit: 2018-01-27 | Discharge: 2018-01-27 | Disposition: A | Discharge status: Home / Self Care | Payer: Medicare HMO | Source: Ambulatory Visit | Attending: Internal Medicine | Admitting: Internal Medicine

## 2018-01-27 DIAGNOSIS — Z1231 Encounter for screening mammogram for malignant neoplasm of breast: Secondary | ICD-10-CM

## 2018-01-28 ENCOUNTER — Other Ambulatory Visit: Payer: Self-pay | Admitting: Internal Medicine

## 2018-01-28 DIAGNOSIS — R928 Other abnormal and inconclusive findings on diagnostic imaging of breast: Secondary | ICD-10-CM

## 2018-02-01 ENCOUNTER — Other Ambulatory Visit: Payer: Medicare HMO

## 2018-02-08 ENCOUNTER — Other Ambulatory Visit: Payer: Medicare HMO

## 2018-02-17 ENCOUNTER — Ambulatory Visit
Admission: RE | Admit: 2018-02-17 | Discharge: 2018-02-17 | Disposition: A | Discharge status: Home / Self Care | Payer: Medicare HMO | Source: Ambulatory Visit | Attending: Internal Medicine | Admitting: Internal Medicine

## 2018-02-17 DIAGNOSIS — R928 Other abnormal and inconclusive findings on diagnostic imaging of breast: Secondary | ICD-10-CM

## 2018-07-31 ENCOUNTER — Inpatient Hospital Stay (HOSPITAL_COMMUNITY)
Admission: EM | Admit: 2018-07-31 | Discharge: 2018-08-05 | DRG: 472 | Disposition: A | Discharge status: Skilled Nursing Facility | Payer: Medicare HMO | Attending: Surgery | Admitting: Surgery

## 2018-07-31 ENCOUNTER — Emergency Department (HOSPITAL_COMMUNITY): Payer: Medicare HMO

## 2018-07-31 ENCOUNTER — Encounter (HOSPITAL_COMMUNITY): Payer: Self-pay

## 2018-07-31 DIAGNOSIS — R42 Dizziness and giddiness: Secondary | ICD-10-CM | POA: Diagnosis present

## 2018-07-31 DIAGNOSIS — S12500A Unspecified displaced fracture of sixth cervical vertebra, initial encounter for closed fracture: Secondary | ICD-10-CM | POA: Diagnosis present

## 2018-07-31 DIAGNOSIS — D62 Acute posthemorrhagic anemia: Secondary | ICD-10-CM | POA: Diagnosis not present

## 2018-07-31 DIAGNOSIS — R112 Nausea with vomiting, unspecified: Secondary | ICD-10-CM | POA: Diagnosis present

## 2018-07-31 DIAGNOSIS — M5410 Radiculopathy, site unspecified: Secondary | ICD-10-CM | POA: Diagnosis present

## 2018-07-31 DIAGNOSIS — I10 Essential (primary) hypertension: Secondary | ICD-10-CM | POA: Diagnosis present

## 2018-07-31 DIAGNOSIS — M25512 Pain in left shoulder: Secondary | ICD-10-CM | POA: Diagnosis present

## 2018-07-31 DIAGNOSIS — H269 Unspecified cataract: Secondary | ICD-10-CM | POA: Diagnosis present

## 2018-07-31 DIAGNOSIS — Y92410 Unspecified street and highway as the place of occurrence of the external cause: Secondary | ICD-10-CM | POA: Diagnosis not present

## 2018-07-31 DIAGNOSIS — S12400A Unspecified displaced fracture of fifth cervical vertebra, initial encounter for closed fracture: Principal | ICD-10-CM | POA: Diagnosis present

## 2018-07-31 DIAGNOSIS — Z885 Allergy status to narcotic agent status: Secondary | ICD-10-CM

## 2018-07-31 DIAGNOSIS — S129XXA Fracture of neck, unspecified, initial encounter: Secondary | ICD-10-CM | POA: Diagnosis present

## 2018-07-31 DIAGNOSIS — Z90710 Acquired absence of both cervix and uterus: Secondary | ICD-10-CM | POA: Diagnosis not present

## 2018-07-31 DIAGNOSIS — K5900 Constipation, unspecified: Secondary | ICD-10-CM | POA: Diagnosis not present

## 2018-07-31 DIAGNOSIS — E039 Hypothyroidism, unspecified: Secondary | ICD-10-CM | POA: Diagnosis present

## 2018-07-31 DIAGNOSIS — Z419 Encounter for procedure for purposes other than remedying health state, unspecified: Secondary | ICD-10-CM

## 2018-07-31 DIAGNOSIS — Z923 Personal history of irradiation: Secondary | ICD-10-CM

## 2018-07-31 DIAGNOSIS — Z853 Personal history of malignant neoplasm of breast: Secondary | ICD-10-CM | POA: Diagnosis not present

## 2018-07-31 DIAGNOSIS — F419 Anxiety disorder, unspecified: Secondary | ICD-10-CM | POA: Diagnosis present

## 2018-07-31 DIAGNOSIS — S2222XA Fracture of body of sternum, initial encounter for closed fracture: Secondary | ICD-10-CM

## 2018-07-31 LAB — COMPREHENSIVE METABOLIC PANEL
ALBUMIN: 3.7 g/dL (ref 3.5–5.0)
ALK PHOS: 79 U/L (ref 38–126)
ALT: 20 U/L (ref 0–44)
ANION GAP: 11 (ref 5–15)
AST: 38 U/L (ref 15–41)
BILIRUBIN TOTAL: 0.6 mg/dL (ref 0.3–1.2)
BUN: 14 mg/dL (ref 8–23)
CALCIUM: 9 mg/dL (ref 8.9–10.3)
CO2: 24 mmol/L (ref 22–32)
Chloride: 105 mmol/L (ref 98–111)
Creatinine, Ser: 0.76 mg/dL (ref 0.44–1.00)
GFR calc Af Amer: >60 mL/min (ref >60)
GLUCOSE: 186 mg/dL — AB (ref 70–99)
Potassium: 3.7 mmol/L (ref 3.5–5.1)
Sodium: 140 mmol/L (ref 135–145)
Total Protein: 6.7 g/dL (ref 6.5–8.1)

## 2018-07-31 LAB — PROTIME-INR
INR: 1.01
Prothrombin Time: 13.2 seconds (ref 11.4–15.2)

## 2018-07-31 LAB — I-STAT CHEM 8, ED
BUN: 18 mg/dL (ref 8–23)
CALCIUM ION: 1.1 mmol/L — AB (ref 1.15–1.40)
CHLORIDE: 106 mmol/L (ref 98–111)
CREATININE: 0.7 mg/dL (ref 0.44–1.00)
Glucose, Bld: 179 mg/dL — ABNORMAL HIGH (ref 70–99)
HCT: 39 % (ref 36.0–46.0)
Hemoglobin: 13.3 g/dL (ref 12.0–15.0)
Potassium: 3.7 mmol/L (ref 3.5–5.1)
Sodium: 139 mmol/L (ref 135–145)
TCO2: 25 mmol/L (ref 22–32)

## 2018-07-31 LAB — SAMPLE TO BLOOD BANK

## 2018-07-31 LAB — CDS SEROLOGY

## 2018-07-31 LAB — ETHANOL

## 2018-07-31 LAB — CBC
HCT: 41.4 % (ref 36.0–46.0)
Hemoglobin: 12.9 g/dL (ref 12.0–15.0)
MCH: 30.1 pg (ref 26.0–34.0)
MCHC: 31.2 g/dL (ref 30.0–36.0)
MCV: 96.5 fL (ref 78.0–100.0)
Platelets: 233 10*3/uL (ref 150–400)
RBC: 4.29 MIL/uL (ref 3.87–5.11)
RDW: 13.1 % (ref 11.5–15.5)
WBC: 17.4 10*3/uL — AB (ref 4.0–10.5)

## 2018-07-31 MED ORDER — DOCUSATE SODIUM 100 MG PO CAPS
100.0000 mg | ORAL_CAPSULE | Freq: Two times a day (BID) | ORAL | Status: DC
  Administered 2018-07-31 – 2018-08-05 (×10): 100 mg via ORAL
  Filled 2018-07-31 (×11): qty 1

## 2018-07-31 MED ORDER — ONDANSETRON HCL 4 MG/2ML IJ SOLN
4.0000 mg | Freq: Four times a day (QID) | INTRAMUSCULAR | Status: DC | PRN
  Administered 2018-08-01: 4 mg via INTRAVENOUS
  Filled 2018-07-31 (×2): qty 2

## 2018-07-31 MED ORDER — MECLIZINE HCL 25 MG PO TABS: 25.0000 mg | ORAL_TABLET | Freq: Four times a day (QID) | ORAL | Status: DC | PRN

## 2018-07-31 MED ORDER — GABAPENTIN 300 MG PO CAPS
300.0000 mg | ORAL_CAPSULE | Freq: Two times a day (BID) | ORAL | Status: DC
  Administered 2018-07-31: 300 mg via ORAL
  Filled 2018-07-31: qty 1

## 2018-07-31 MED ORDER — ONDANSETRON HCL 4 MG/2ML IJ SOLN
4.0000 mg | Freq: Once | INTRAMUSCULAR | Status: AC
  Administered 2018-07-31: 4 mg via INTRAVENOUS
  Filled 2018-07-31: qty 2

## 2018-07-31 MED ORDER — ACETAMINOPHEN 325 MG PO TABS
650.0000 mg | ORAL_TABLET | ORAL | Status: DC | PRN
  Administered 2018-08-02: 650 mg via ORAL
  Filled 2018-07-31: qty 2

## 2018-07-31 MED ORDER — LEVOTHYROXINE SODIUM 50 MCG PO TABS
50.0000 ug | ORAL_TABLET | Freq: Every day | ORAL | Status: DC
  Administered 2018-08-01 – 2018-08-05 (×5): 50 ug via ORAL
  Filled 2018-07-31 (×5): qty 1

## 2018-07-31 MED ORDER — FAMOTIDINE IN NACL 20-0.9 MG/50ML-% IV SOLN
20.0000 mg | Freq: Two times a day (BID) | INTRAVENOUS | Status: DC
  Administered 2018-07-31 – 2018-08-03 (×6): 20 mg via INTRAVENOUS
  Filled 2018-07-31 (×6): qty 50

## 2018-07-31 MED ORDER — IOPAMIDOL (ISOVUE-300) INJECTION 61%
INTRAVENOUS | Status: AC
  Filled 2018-07-31: qty 100

## 2018-07-31 MED ORDER — SODIUM CHLORIDE 0.9 % IV BOLUS (SEPSIS)
500.0000 mL | Freq: Once | INTRAVENOUS | Status: AC
  Administered 2018-07-31: 500 mL via INTRAVENOUS

## 2018-07-31 MED ORDER — HYDRALAZINE HCL 20 MG/ML IJ SOLN: 10.0000 mg | INTRAMUSCULAR | Status: DC | PRN

## 2018-07-31 MED ORDER — SODIUM CHLORIDE 0.9 % IV SOLN: 1000.0000 mL | INTRAVENOUS | Status: DC

## 2018-07-31 MED ORDER — HYDROMORPHONE HCL 1 MG/ML IJ SOLN: 0.5000 mg | INTRAMUSCULAR | Status: DC | PRN

## 2018-07-31 MED ORDER — SODIUM CHLORIDE 0.9 % IV SOLN
INTRAVENOUS | Status: DC
  Administered 2018-07-31 – 2018-08-02 (×4): via INTRAVENOUS

## 2018-07-31 MED ORDER — IOPAMIDOL (ISOVUE-300) INJECTION 61%
100.0000 mL | Freq: Once | INTRAVENOUS | Status: AC | PRN
  Administered 2018-07-31: 100 mL via INTRAVENOUS

## 2018-07-31 MED ORDER — CITALOPRAM HYDROBROMIDE 20 MG PO TABS
20.0000 mg | ORAL_TABLET | Freq: Every day | ORAL | Status: DC
  Administered 2018-07-31 – 2018-08-05 (×6): 20 mg via ORAL
  Filled 2018-07-31: qty 1
  Filled 2018-07-31 (×2): qty 2
  Filled 2018-07-31 (×3): qty 1

## 2018-07-31 MED ORDER — FENTANYL CITRATE (PF) 100 MCG/2ML IJ SOLN
50.0000 ug | Freq: Once | INTRAMUSCULAR | Status: AC
  Administered 2018-07-31: 50 ug via INTRAVENOUS
  Filled 2018-07-31: qty 2

## 2018-07-31 MED ORDER — OXYCODONE HCL 5 MG PO TABS
5.0000 mg | ORAL_TABLET | ORAL | Status: DC | PRN
  Administered 2018-08-01 – 2018-08-03 (×3): 5 mg via ORAL
  Filled 2018-07-31 (×5): qty 1

## 2018-07-31 MED ORDER — METHOCARBAMOL 1000 MG/10ML IJ SOLN
500.0000 mg | Freq: Four times a day (QID) | INTRAVENOUS | Status: DC | PRN
  Filled 2018-07-31: qty 5

## 2018-07-31 MED ORDER — ONDANSETRON 4 MG PO TBDP: 4.0000 mg | ORAL_TABLET | Freq: Four times a day (QID) | ORAL | Status: DC | PRN

## 2018-07-31 MED ORDER — IBUPROFEN 200 MG PO TABS
800.0000 mg | ORAL_TABLET | Freq: Three times a day (TID) | ORAL | Status: DC
  Administered 2018-07-31 – 2018-08-05 (×14): 800 mg via ORAL
  Filled 2018-07-31 (×7): qty 4
  Filled 2018-07-31 (×2): qty 1
  Filled 2018-07-31: qty 4
  Filled 2018-07-31: qty 1
  Filled 2018-07-31: qty 4
  Filled 2018-07-31: qty 1
  Filled 2018-07-31 (×3): qty 4
  Filled 2018-07-31: qty 1
  Filled 2018-07-31: qty 4

## 2018-07-31 MED ORDER — METOPROLOL TARTRATE 5 MG/5ML IV SOLN: 5.0000 mg | Freq: Four times a day (QID) | INTRAVENOUS | Status: DC | PRN
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
## 2018-08-01 LAB — CBC
HCT: 35.7 % — ABNORMAL LOW (ref 36.0–46.0)
Hemoglobin: 11.4 g/dL — ABNORMAL LOW (ref 12.0–15.0)
MCH: 30.2 pg (ref 26.0–34.0)
MCHC: 31.9 g/dL (ref 30.0–36.0)
MCV: 94.7 fL (ref 78.0–100.0)
PLATELETS: 206 10*3/uL (ref 150–400)
RBC: 3.77 MIL/uL — ABNORMAL LOW (ref 3.87–5.11)
RDW: 13.2 % (ref 11.5–15.5)
WBC: 11.1 10*3/uL — ABNORMAL HIGH (ref 4.0–10.5)

## 2018-08-01 LAB — BASIC METABOLIC PANEL
Anion gap: 10 (ref 5–15)
BUN: 10 mg/dL (ref 8–23)
CALCIUM: 8.2 mg/dL — AB (ref 8.9–10.3)
CHLORIDE: 105 mmol/L (ref 98–111)
CO2: 25 mmol/L (ref 22–32)
CREATININE: 0.66 mg/dL (ref 0.44–1.00)
GFR calc non Af Amer: >60 mL/min (ref >60)
Glucose, Bld: 135 mg/dL — ABNORMAL HIGH (ref 70–99)
Potassium: 3.5 mmol/L (ref 3.5–5.1)
SODIUM: 140 mmol/L (ref 135–145)

## 2018-08-01 LAB — MRSA PCR SCREENING: MRSA BY PCR: NEGATIVE

## 2018-08-01 MED ORDER — CEFAZOLIN SODIUM-DEXTROSE 2-4 GM/100ML-% IV SOLN
2.0000 g | INTRAVENOUS | Status: AC
  Administered 2018-08-02: 2 g via INTRAVENOUS
  Filled 2018-08-01 (×2): qty 100

## 2018-08-01 MED ORDER — PREDNISOLONE ACETATE 1 % OP SUSP
1.0000 [drp] | Freq: Two times a day (BID) | OPHTHALMIC | Status: DC
  Administered 2018-08-01 – 2018-08-05 (×9): 1 [drp] via OPHTHALMIC
  Filled 2018-08-01: qty 5

## 2018-08-01 MED ORDER — OFLOXACIN 0.3 % OP SOLN
1.0000 [drp] | Freq: Two times a day (BID) | OPHTHALMIC | Status: DC
  Administered 2018-08-01 – 2018-08-05 (×9): 1 [drp] via OPHTHALMIC
  Filled 2018-08-01: qty 5

## 2018-08-01 MED ORDER — GABAPENTIN 300 MG PO CAPS
300.0000 mg | ORAL_CAPSULE | Freq: Three times a day (TID) | ORAL | Status: DC
  Administered 2018-08-01 – 2018-08-05 (×14): 300 mg via ORAL
  Filled 2018-08-01 (×14): qty 1

## 2018-08-01 MED ORDER — DIFLUPREDNATE 0.05 % OP EMUL
1.0000 [drp] | Freq: Two times a day (BID) | OPHTHALMIC | Status: DC
  Filled 2018-08-01: qty 5

## 2018-08-01 MED ORDER — DILTIAZEM HCL ER COATED BEADS 180 MG PO CP24
180.0000 mg | ORAL_CAPSULE | Freq: Every day | ORAL | Status: DC
  Administered 2018-08-01 – 2018-08-05 (×5): 180 mg via ORAL
  Filled 2018-08-01 (×6): qty 1

## 2018-08-01 NOTE — Progress Notes (Signed)
Overall stable.  Patient with complaints of neck pain and anterior chest pain.  Patient has begun to notice some numbness and tingling in her left thumb.  She has some achiness toward her left shoulder.  She has no other radicular symptoms.  She has no new symptoms of weakness.  She is awake and alert.  She is oriented and appropriate.  Motor examination is intact bilateral.  Sensory examination with some mild decreased sensation in her left C6 dermatome.  Chest remains tender.  Patient with C56 facet fracture with rotatory subluxation and perching of her C5 and C6 facets on the right side.  I discussed situation with the patient.  Recommend we move forward with surgical decompression and fusion tomorrow in hopes of improving her symptoms.  I discussed the risks and benefits involved with surgery including but not limited to the risk of anesthesia, bleeding, infection, CSF leak, nerve root injury, spinal cord injury, fusion failure, is patient failure, dysphagia, dysphonia, continued pain, and non-benefit.  The patient has been given the opportunity to ask questions.  She appears to understand.  She wishes to proceed with surgery.
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
## 2018-08-01 NOTE — H&P (View-Only) (Signed)
Overall stable.  Patient with complaints of neck pain and anterior chest pain.  Patient has begun to notice some numbness and tingling in her left thumb.  She has some achiness toward her left shoulder.  She has no other radicular symptoms.  She has no new symptoms of weakness.  She is awake and alert.  She is oriented and appropriate.  Motor examination is intact bilateral.  Sensory examination with some mild decreased sensation in her left C6 dermatome.  Chest remains tender.  Patient with C56 facet fracture with rotatory subluxation and perching of her C5 and C6 facets on the right side.  I discussed situation with the patient.  Recommend we move forward with surgical decompression and fusion tomorrow in hopes of improving her symptoms.  I discussed the risks and benefits involved with surgery including but not limited to the risk of anesthesia, bleeding, infection, CSF leak, nerve root injury, spinal cord injury, fusion failure, is patient failure, dysphagia, dysphonia, continued pain, and non-benefit.  The patient has been given the opportunity to ask questions.  She appears to understand.  She wishes to proceed with surgery.

## 2018-08-01 NOTE — Progress Notes (Signed)
OT Cancellation Note  Patient Details Name: Julie Rollins MRN: [id number] DOB: [birth year]   Cancelled Treatment:    Reason Eval/Treat Not Completed: Pt for cervical fusion tomorrow.  Will defer OT eval until post surgery.  Lucille Passy, OTR/L Acute Rehabilitation Services Pager [phone] Office [phone]   Lucille Passy M 08/01/2018, 2:57 PM
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
## 2018-08-02 ENCOUNTER — Inpatient Hospital Stay (HOSPITAL_COMMUNITY): Payer: Medicare HMO | Admitting: Anesthesiology

## 2018-08-02 ENCOUNTER — Other Ambulatory Visit: Payer: Self-pay

## 2018-08-02 ENCOUNTER — Inpatient Hospital Stay (HOSPITAL_COMMUNITY): Admission: EM | Disposition: A | Discharge status: Skilled Nursing Facility | Payer: Self-pay | Source: Home / Self Care

## 2018-08-02 ENCOUNTER — Inpatient Hospital Stay (HOSPITAL_COMMUNITY): Payer: Medicare HMO

## 2018-08-02 ENCOUNTER — Encounter (HOSPITAL_COMMUNITY): Payer: Self-pay | Admitting: Orthopedic Surgery

## 2018-08-02 HISTORY — PX: ANTERIOR CERVICAL DECOMP/DISCECTOMY FUSION: SHX1161

## 2018-08-02 SURGERY — ANTERIOR CERVICAL DECOMPRESSION/DISCECTOMY FUSION 1 LEVEL
Anesthesia: General

## 2018-08-02 MED ORDER — ONDANSETRON HCL 4 MG/2ML IJ SOLN: 4.0000 mg | Freq: Once | INTRAMUSCULAR | Status: DC | PRN

## 2018-08-02 MED ORDER — DEXAMETHASONE SODIUM PHOSPHATE 10 MG/ML IJ SOLN
INTRAMUSCULAR | Status: DC | PRN
  Administered 2018-08-02: 10 mg via INTRAVENOUS

## 2018-08-02 MED ORDER — MENTHOL 3 MG MT LOZG: 1.0000 | LOZENGE | OROMUCOSAL | Status: DC | PRN

## 2018-08-02 MED ORDER — SODIUM CHLORIDE 0.9% FLUSH
3.0000 mL | Freq: Two times a day (BID) | INTRAVENOUS | Status: DC
  Administered 2018-08-02 (×2): 3 mL via INTRAVENOUS

## 2018-08-02 MED ORDER — ONDANSETRON HCL 4 MG/2ML IJ SOLN
INTRAMUSCULAR | Status: AC
  Filled 2018-08-02: qty 2

## 2018-08-02 MED ORDER — SODIUM CHLORIDE 0.9 % IV SOLN
INTRAVENOUS | Status: DC | PRN
  Administered 2018-08-02: 14:00:00

## 2018-08-02 MED ORDER — SUFENTANIL CITRATE 50 MCG/ML IV SOLN
INTRAVENOUS | Status: AC
  Filled 2018-08-02: qty 1

## 2018-08-02 MED ORDER — SUGAMMADEX SODIUM 200 MG/2ML IV SOLN
INTRAVENOUS | Status: DC | PRN
  Administered 2018-08-02: 200 mg via INTRAVENOUS

## 2018-08-02 MED ORDER — ROCURONIUM BROMIDE 10 MG/ML (PF) SYRINGE
PREFILLED_SYRINGE | INTRAVENOUS | Status: DC | PRN
  Administered 2018-08-02: 50 mg via INTRAVENOUS

## 2018-08-02 MED ORDER — PROPOFOL 10 MG/ML IV BOLUS
INTRAVENOUS | Status: AC
  Filled 2018-08-02: qty 20

## 2018-08-02 MED ORDER — LACTATED RINGERS IV SOLN
INTRAVENOUS | Status: DC
  Administered 2018-08-02: 11:00:00 via INTRAVENOUS

## 2018-08-02 MED ORDER — FENTANYL CITRATE (PF) 100 MCG/2ML IJ SOLN: 25.0000 ug | INTRAMUSCULAR | Status: DC | PRN

## 2018-08-02 MED ORDER — SUFENTANIL CITRATE 50 MCG/ML IV SOLN
INTRAVENOUS | Status: DC | PRN
  Administered 2018-08-02: 10 ug via INTRAVENOUS

## 2018-08-02 MED ORDER — ROCURONIUM BROMIDE 50 MG/5ML IV SOSY
PREFILLED_SYRINGE | INTRAVENOUS | Status: AC
  Filled 2018-08-02: qty 5

## 2018-08-02 MED ORDER — MIDAZOLAM HCL 5 MG/5ML IJ SOLN
INTRAMUSCULAR | Status: DC | PRN
  Administered 2018-08-02: 2 mg via INTRAVENOUS

## 2018-08-02 MED ORDER — LIDOCAINE 2% (20 MG/ML) 5 ML SYRINGE
INTRAMUSCULAR | Status: AC
  Filled 2018-08-02: qty 5

## 2018-08-02 MED ORDER — THROMBIN 5000 UNITS EX SOLR
CUTANEOUS | Status: DC | PRN
  Administered 2018-08-02: 10000 [IU] via TOPICAL

## 2018-08-02 MED ORDER — ONDANSETRON HCL 4 MG/2ML IJ SOLN
INTRAMUSCULAR | Status: DC | PRN
  Administered 2018-08-02: 4 mg via INTRAVENOUS

## 2018-08-02 MED ORDER — 0.9 % SODIUM CHLORIDE (POUR BTL) OPTIME
TOPICAL | Status: DC | PRN
  Administered 2018-08-02: 1000 mL

## 2018-08-02 MED ORDER — HEMOSTATIC AGENTS (NO CHARGE) OPTIME
TOPICAL | Status: DC | PRN
  Administered 2018-08-02: 1 via TOPICAL

## 2018-08-02 MED ORDER — CEFAZOLIN SODIUM-DEXTROSE 1-4 GM/50ML-% IV SOLN
1.0000 g | Freq: Three times a day (TID) | INTRAVENOUS | Status: AC
  Administered 2018-08-03 (×2): 1 g via INTRAVENOUS
  Filled 2018-08-02 (×2): qty 50

## 2018-08-02 MED ORDER — SODIUM CHLORIDE 0.9% FLUSH: 3.0000 mL | INTRAVENOUS | Status: DC | PRN

## 2018-08-02 MED ORDER — PROPOFOL 10 MG/ML IV BOLUS
INTRAVENOUS | Status: DC | PRN
  Administered 2018-08-02: 20 mg via INTRAVENOUS
  Administered 2018-08-02: 150 mg via INTRAVENOUS

## 2018-08-02 MED ORDER — LIDOCAINE 2% (20 MG/ML) 5 ML SYRINGE
INTRAMUSCULAR | Status: DC | PRN
  Administered 2018-08-02: 80 mg via INTRAVENOUS

## 2018-08-02 MED ORDER — THROMBIN 5000 UNITS EX SOLR
CUTANEOUS | Status: AC
  Filled 2018-08-02: qty 10000

## 2018-08-02 MED ORDER — SODIUM CHLORIDE 0.9 % IV SOLN: 250.0000 mL | INTRAVENOUS | Status: DC

## 2018-08-02 MED ORDER — MIDAZOLAM HCL 2 MG/2ML IJ SOLN
INTRAMUSCULAR | Status: AC
  Filled 2018-08-02: qty 2

## 2018-08-02 MED ORDER — PHENOL 1.4 % MT LIQD: 1.0000 | OROMUCOSAL | Status: DC | PRN

## 2018-08-02 MED ORDER — HYDROCODONE-ACETAMINOPHEN 5-325 MG PO TABS: 1.0000 | ORAL_TABLET | ORAL | Status: DC | PRN

## 2018-08-02 MED ORDER — DEXAMETHASONE SODIUM PHOSPHATE 10 MG/ML IJ SOLN
INTRAMUSCULAR | Status: AC
  Filled 2018-08-02: qty 1

## 2018-08-02 SURGICAL SUPPLY — 55 items
APL SKNCLS STERI-STRIP NONHPOA (GAUZE/BANDAGES/DRESSINGS) ×1
BAG DECANTER FOR FLEXI CONT (MISCELLANEOUS) ×3 IMPLANT
BENZOIN TINCTURE PRP APPL 2/3 (GAUZE/BANDAGES/DRESSINGS) ×3 IMPLANT
BIT DRILL 13 (BIT) ×1 IMPLANT
BIT DRILL 13MM (BIT) ×1
BUR MATCHSTICK NEURO 3.0 LAGG (BURR) ×3 IMPLANT
CAGE PEEK 6X14X11 (Cage) ×3 IMPLANT
CANISTER SUCT 3000ML PPV (MISCELLANEOUS) ×3 IMPLANT
CARTRIDGE OIL MAESTRO DRILL (MISCELLANEOUS) ×1 IMPLANT
CLOSURE WOUND 1/2 X4 (GAUZE/BANDAGES/DRESSINGS) ×1
DIFFUSER DRILL AIR PNEUMATIC (MISCELLANEOUS) ×3 IMPLANT
DRAPE C-ARM 42X72 X-RAY (DRAPES) ×6 IMPLANT
DRAPE LAPAROTOMY 100X72 PEDS (DRAPES) ×3 IMPLANT
DRAPE MICROSCOPE LEICA (MISCELLANEOUS) ×3 IMPLANT
DRSG OPSITE POSTOP 4X6 (GAUZE/BANDAGES/DRESSINGS) ×2 IMPLANT
DURAPREP 6ML APPLICATOR 50/CS (WOUND CARE) ×3 IMPLANT
ELECT COATED BLADE 2.86 ST (ELECTRODE) ×3 IMPLANT
ELECT REM PT RETURN 9FT ADLT (ELECTROSURGICAL) ×3
ELECTRODE REM PT RTRN 9FT ADLT (ELECTROSURGICAL) ×1 IMPLANT
GAUZE 4X4 16PLY RFD (DISPOSABLE) IMPLANT
GAUZE SPONGE 4X4 12PLY STRL (GAUZE/BANDAGES/DRESSINGS) ×3 IMPLANT
GLOVE ECLIPSE 9.0 STRL (GLOVE) ×3 IMPLANT
GLOVE EXAM NITRILE LRG STRL (GLOVE) IMPLANT
GLOVE EXAM NITRILE XL STR (GLOVE) IMPLANT
GLOVE EXAM NITRILE XS STR PU (GLOVE) IMPLANT
GOWN STRL REUS W/ TWL LRG LVL3 (GOWN DISPOSABLE) IMPLANT
GOWN STRL REUS W/ TWL XL LVL3 (GOWN DISPOSABLE) IMPLANT
GOWN STRL REUS W/TWL 2XL LVL3 (GOWN DISPOSABLE) IMPLANT
GOWN STRL REUS W/TWL LRG LVL3 (GOWN DISPOSABLE)
GOWN STRL REUS W/TWL XL LVL3 (GOWN DISPOSABLE)
HALTER HD/CHIN CERV TRACTION D (MISCELLANEOUS) ×3 IMPLANT
HEMOSTAT POWDER KIT SURGIFOAM (HEMOSTASIS) IMPLANT
KIT BASIN OR (CUSTOM PROCEDURE TRAY) ×3 IMPLANT
KIT TURNOVER KIT B (KITS) ×3 IMPLANT
NDL SPNL 20GX3.5 QUINCKE YW (NEEDLE) ×1 IMPLANT
NEEDLE SPNL 20GX3.5 QUINCKE YW (NEEDLE) ×3 IMPLANT
NS IRRIG 1000ML POUR BTL (IV SOLUTION) ×3 IMPLANT
OIL CARTRIDGE MAESTRO DRILL (MISCELLANEOUS) ×3
PACK LAMINECTOMY NEURO (CUSTOM PROCEDURE TRAY) ×3 IMPLANT
PAD ARMBOARD 7.5X6 YLW CONV (MISCELLANEOUS) ×9 IMPLANT
PLATE 23MM (Plate) ×2 IMPLANT
RUBBERBAND STERILE (MISCELLANEOUS) ×6 IMPLANT
SCREW ST FIX 4 ATL 3120213 (Screw) ×8 IMPLANT
SPACER SPNL 11X14X6XPEEK CVD (Cage) IMPLANT
SPCR SPNL 11X14X6XPEEK CVD (Cage) ×1 IMPLANT
SPONGE INTESTINAL PEANUT (DISPOSABLE) ×3 IMPLANT
SPONGE SURGIFOAM ABS GEL SZ50 (HEMOSTASIS) ×3 IMPLANT
STRIP CLOSURE SKIN 1/2X4 (GAUZE/BANDAGES/DRESSINGS) ×2 IMPLANT
SUT VIC AB 3-0 SH 8-18 (SUTURE) ×3 IMPLANT
SUT VIC AB 4-0 RB1 18 (SUTURE) ×3 IMPLANT
TAPE CLOTH 4X10 WHT NS (GAUZE/BANDAGES/DRESSINGS) ×3 IMPLANT
TOWEL GREEN STERILE (TOWEL DISPOSABLE) ×3 IMPLANT
TOWEL GREEN STERILE FF (TOWEL DISPOSABLE) ×3 IMPLANT
TRAP SPECIMEN MUCOUS 40CC (MISCELLANEOUS) ×3 IMPLANT
WATER STERILE IRR 1000ML POUR (IV SOLUTION) ×3 IMPLANT
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
## 2018-08-02 NOTE — Brief Op Note (Signed)
08/02/2018  1:58 PM  PATIENT:  Julie Rollins  72 y.o. female  PRE-OPERATIVE DIAGNOSIS:  Cervical five-Cervical six fracture/subluxation  POST-OPERATIVE DIAGNOSIS:  Cervical five-Cervical six fracture/subluxation  PROCEDURE:  Procedure(s) with comments: ANTERIOR CERVICAL DECOMPRESSION/DISCECTOMY FUSION CERVICAL FIVE- CERVICAL SIX (N/A) - ANTERIOR CERVICAL DECOMPRESSION/DISCECTOMY FUSION CERVICAL FIVE- CERVICAL SIX  SURGEON:  Surgeon(s) and Role:    * Earnie Larsson, MD - Primary  PHYSICIAN ASSISTANT:   ASSISTANTSMearl Latin   ANESTHESIA:   general  EBL: MInimal  BLOOD ADMINISTERED:none  DRAINS: none   LOCAL MEDICATIONS USED:  NONE  SPECIMEN:  No Specimen  DISPOSITION OF SPECIMEN:  N/A  COUNTS:  YES  TOURNIQUET:  * No tourniquets in log *  DICTATION: .Dragon Dictation  PLAN OF CARE: Admit to inpatient   PATIENT DISPOSITION:  PACU - hemodynamically stable.   Delay start of Pharmacological VTE agent (>24hrs) due to surgical blood loss or risk of bleeding: yes

## 2018-08-02 NOTE — Progress Notes (Signed)
CSW met with patient via bedside to complete SBIRT. Patient presented as pleasant but anxious about pending surgery and disposition plans. Patient currently lives at home alone and does not receive much help at home. Patient states that MD voiced the possibility of needing rehab after surgery today and patient would be open to this. CSW informed patient of current PT recommendation of home health however would continue to follow patient in the event her needs changed. Patient voiced no concerns of alcohol or substance abuse at this time and voiced no other concerns to CSW. CSW will continue to follow patient in the event PT makes new recommendations.  CSW completed SBIRT in flowsheets.    Kingsley Spittle, [place]  [phone]

## 2018-08-02 NOTE — Anesthesia Procedure Notes (Signed)
Procedure Name: Intubation Date/Time: 08/02/2018 12:46 PM Performed by: Moshe Salisbury, CRNA Pre-anesthesia Checklist: Patient identified, Emergency Drugs available, Suction available and Patient being monitored Patient Re-evaluated:Patient Re-evaluated prior to induction Oxygen Delivery Method: Circle System Utilized Preoxygenation: Pre-oxygenation with 100% oxygen Induction Type: IV induction Ventilation: Mask ventilation without difficulty Laryngoscope Size: Glidescope and 3 Tube type: Oral Tube size: 7.5 mm Number of attempts: 2 Airway Equipment and Method: Rigid stylet,  Video-laryngoscopy and Bougie stylet Placement Confirmation: ETT inserted through vocal cords under direct vision,  positive ETCO2 and breath sounds checked- equal and bilateral Secured at: 21 cm Tube secured with: Tape Dental Injury: Teeth and Oropharynx as per pre-operative assessment  Comments: Patient remains in rigid cervical collar throughout intubation process. Easy glide scope intubation but ETT cuff found to be leaking. ETT exchanged over [facility] flexible stylet without difficulty.

## 2018-08-02 NOTE — Transfer of Care (Signed)
Immediate Anesthesia Transfer of Care Note  Patient: Julie Rollins  Procedure(s) Performed: ANTERIOR CERVICAL DECOMPRESSION/DISCECTOMY FUSION CERVICAL FIVE- CERVICAL SIX (N/A )  Patient Location: PACU  Anesthesia Type:General  Level of Consciousness: awake, oriented and patient cooperative  Airway & Oxygen Therapy: Patient Spontanous Breathing and Patient connected to nasal cannula oxygen  Post-op Assessment: Report given to RN, Post -op Vital signs reviewed and stable and Patient moving all extremities  Post vital signs: Reviewed and stable  Last Vitals:  Vitals Value Taken Time  BP 124/41 08/02/2018  2:10 PM  Temp 36.8 C 08/02/2018  2:10 PM  Pulse 73 08/02/2018  2:11 PM  Resp 18 08/02/2018  2:11 PM  SpO2 96 % 08/02/2018  2:11 PM  Vitals shown include unvalidated device data.  Last Pain:  Vitals:   08/02/18 0758  TempSrc:   PainSc: 3       Patients Stated Pain Goal: 0 (23/36/12 2449)  Complications: No apparent anesthesia complications

## 2018-08-02 NOTE — Anesthesia Postprocedure Evaluation (Signed)
Anesthesia Post Note  Patient: KENNITA PAVLOVICH  Procedure(s) Performed: ANTERIOR CERVICAL DECOMPRESSION/DISCECTOMY FUSION CERVICAL FIVE- CERVICAL SIX (N/A )     Patient location during evaluation: PACU Anesthesia Type: General Level of consciousness: awake and alert Pain management: pain level controlled Vital Signs Assessment: post-procedure vital signs reviewed and stable Respiratory status: spontaneous breathing, nonlabored ventilation, respiratory function stable and patient connected to nasal cannula oxygen Cardiovascular status: blood pressure returned to baseline and stable Postop Assessment: no apparent nausea or vomiting Anesthetic complications: no    Last Vitals:  Vitals:   08/02/18 1425 08/02/18 1440  BP: (!) 141/59 132/61  Pulse: 69 69  Resp: 14 14  Temp:  36.7 C  SpO2: 92% 94%    Last Pain:  Vitals:   08/02/18 1410  TempSrc:   PainSc: 3                  Audry Pili

## 2018-08-02 NOTE — Interval H&P Note (Signed)
History and Physical Interval Note:  08/02/2018 12:05 PM  Julie Rollins  has presented today for surgery, with the diagnosis of C5-C6 fracture/subluxation  The various methods of treatment have been discussed with the patient and family. After consideration of risks, benefits and other options for treatment, the patient has consented to  Procedure(s): ANTERIOR CERVICAL DECOMPRESSION/DISCECTOMY FUSION 1 LEVEL (N/A) as a surgical intervention .  The patient's history has been reviewed, patient examined, no change in status, stable for surgery.  I have reviewed the patient's chart and labs.  Questions were answered to the patient's satisfaction.     Cooper Render Astha Probasco

## 2018-08-03 ENCOUNTER — Encounter (HOSPITAL_COMMUNITY): Payer: Self-pay | Admitting: Neurosurgery

## 2018-08-03 LAB — BASIC METABOLIC PANEL
ANION GAP: 13 (ref 5–15)
BUN: 8 mg/dL (ref 8–23)
CO2: 21 mmol/L — AB (ref 22–32)
Calcium: 8.9 mg/dL (ref 8.9–10.3)
Chloride: 105 mmol/L (ref 98–111)
Creatinine, Ser: 0.49 mg/dL (ref 0.44–1.00)
GFR calc Af Amer: >60 mL/min (ref >60)
GFR calc non Af Amer: >60 mL/min (ref >60)
GLUCOSE: 143 mg/dL — AB (ref 70–99)
POTASSIUM: 3.3 mmol/L — AB (ref 3.5–5.1)
Sodium: 139 mmol/L (ref 135–145)

## 2018-08-03 LAB — CBC
HEMATOCRIT: 36.6 % (ref 36.0–46.0)
Hemoglobin: 11.9 g/dL — ABNORMAL LOW (ref 12.0–15.0)
MCH: 30.1 pg (ref 26.0–34.0)
MCHC: 32.5 g/dL (ref 30.0–36.0)
MCV: 92.7 fL (ref 78.0–100.0)
Platelets: 219 10*3/uL (ref 150–400)
RBC: 3.95 MIL/uL (ref 3.87–5.11)
RDW: 13 % (ref 11.5–15.5)
WBC: 9.6 10*3/uL (ref 4.0–10.5)

## 2018-08-03 MED ORDER — FAMOTIDINE 20 MG PO TABS
20.0000 mg | ORAL_TABLET | Freq: Two times a day (BID) | ORAL | Status: DC
  Administered 2018-08-03 – 2018-08-05 (×5): 20 mg via ORAL
  Filled 2018-08-03 (×5): qty 1

## 2018-08-03 MED ORDER — POLYETHYLENE GLYCOL 3350 17 G PO PACK
17.0000 g | PACK | Freq: Every day | ORAL | Status: DC
  Administered 2018-08-03 – 2018-08-05 (×3): 17 g via ORAL
  Filled 2018-08-03 (×3): qty 1

## 2018-08-03 MED ORDER — ACETAMINOPHEN 325 MG PO TABS
650.0000 mg | ORAL_TABLET | Freq: Three times a day (TID) | ORAL | Status: DC
  Administered 2018-08-03 – 2018-08-05 (×7): 650 mg via ORAL
  Filled 2018-08-03 (×8): qty 2

## 2018-08-03 MED ORDER — HYDROMORPHONE HCL 1 MG/ML IJ SOLN: 0.5000 mg | INTRAMUSCULAR | Status: DC | PRN

## 2018-08-03 NOTE — Care Management Important Message (Signed)
Important Message  Patient Details  Name: Julie Rollins MRN: [id number] Date of Birth: March 12, 1946   Medicare Important Message Given:  Yes    Antonea Gaut Montine Circle 08/03/2018, 1:55 PM

## 2018-08-03 NOTE — Progress Notes (Signed)
PHARMACIST - PHYSICIAN COMMUNICATION  DR:  Hulen Skains  CONCERNING: IV to Oral Route Change Policy  RECOMMENDATION: This patient is receiving Pepcid by the intravenous route.  Based on criteria approved by the Pharmacy and Therapeutics Committee, the intravenous medication(s) is/are being converted to the equivalent oral dose form(s).   DESCRIPTION: These criteria include:  The patient is eating (either orally or via tube) and/or has been taking other orally administered medications for a least 24 hours  The patient has no evidence of active gastrointestinal bleeding or impaired GI absorption (gastrectomy, short bowel, patient on TNA or NPO).  If you have questions about this conversion, please contact the Pharmacy Department  []   [phone] )  Julie Rollins []   [phone] )  Julie Rollins [x]   [phone] )  Julie Rollins []   [phone] )  Julie Rollins []   [phone] )  Julie Rollins, PharmD, [place] Clinical Pharmacist Please see AMION for all Pharmacists' Contact Phone Numbers 08/03/2018, 12:23 PM

## 2018-08-03 NOTE — Progress Notes (Signed)
Patient is up walking the halls with PT/OT.

## 2018-08-03 NOTE — Care Management Note (Signed)
Case Management Note  Patient Details  Name: ARNEISHA KINCANNON MRN: [id number] Date of Birth: [birth year]  Subjective/Objective:                    Action/Plan: Discussed PT recommendations with patient HHPT, walker and 3 in 1. Patient lives alone and wants to go to [place] where her brother is. Explained insurance looks at PT recommendations and how she did with PT. Patient determined insurance will cover SNF and wants to speak directly with SW. Junie Panning with SW aware and will visit patient this afternoon.   Expected Discharge Date:                  Expected Discharge Plan:  [place]  In-House Referral:     Discharge planning Services     Post Acute Care Choice:    Choice offered to:  Patient  DME Arranged:    DME Agency:     HH Arranged:    [place] Agency:     Status of Service:  In process, will continue to follow  If discussed at Long Length of Stay Meetings, dates discussed:    Additional Comments:  Marilu Favre, RN 08/03/2018, 12:31 PM
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
## 2018-08-03 NOTE — Progress Notes (Signed)
Trauma Service Note  Subjective: Patient up in a chair with assistance.  Tired, but in no acute distress.    Objective: Vital signs in last 24 hours: Temp:  [97.9 F (36.6 C)-98.2 F (36.8 C)] 98.2 F (36.8 C) (09/10 0715) Pulse Rate:  [69-83] 69 (09/10 0517) Resp:  [12-17] 15 (09/10 0517) BP: (124-170)/(41-75) 144/64 (09/10 0517) SpO2:  [92 %-100 %] 98 % (09/10 0517) Weight:  [67.5 kg] 67.5 kg (09/09 1107) Last BM Date: (PTA)  Intake/Output from previous day: 09/09 0701 - 09/10 0700 In: 983 [P.O.:480; I.V.:503] Out: 1330 [Urine:1300; Blood:30] Intake/Output this shift: No intake/output data recorded.  General: No distress.  Expresseda adesire to go to [place] where her brother resides  Lungs: Clear.  Small tidal volume  Abd: Soft, benign.  No bowel movement  Extremities: No changes, some bruising.  Neuro: Intact  Lab Results: CBC  Recent Labs    08/01/18 0328 08/03/18 0247  WBC 11.1* 9.6  HGB 11.4* 11.9*  HCT 35.7* 36.6  PLT 206 219   BMET Recent Labs    08/01/18 0328 08/03/18 0247  NA 140 139  K 3.5 3.3*  CL 105 105  CO2 25 21*  GLUCOSE 135* 143*  BUN 10 8  CREATININE 0.66 0.49  CALCIUM 8.2* 8.9   PT/INR Recent Labs    07/31/18 1740  LABPROT 13.2  INR 1.01   ABG No results for input(s): PHART, HCO3 in the last 72 hours.  Invalid input(s): PCO2, PO2  Studies/Results: Dg Cervical Spine 1 View  Result Date: 08/02/2018 CLINICAL DATA:  Fracture EXAM: DG CERVICAL SPINE - 1 VIEW; DG C-ARM 61-120 MIN COMPARISON:  CT 07/31/2018 FINDINGS: Single low resolution intraoperative spot view of the cervical spine. Total fluoroscopy time was 6 seconds. Anterior plate and screw fixation with interbody device C5-C6 IMPRESSION: Intraoperative fluoroscopic assistance provided during cervical spine surgery. Electronically Signed   By: Donavan Foil M.D.   On: 08/02/2018 14:10   Dg C-arm 1-60 Min  Result Date: 08/02/2018 CLINICAL DATA:  Fracture EXAM: DG  CERVICAL SPINE - 1 VIEW; DG C-ARM 61-120 MIN COMPARISON:  CT 07/31/2018 FINDINGS: Single low resolution intraoperative spot view of the cervical spine. Total fluoroscopy time was 6 seconds. Anterior plate and screw fixation with interbody device C5-C6 IMPRESSION: Intraoperative fluoroscopic assistance provided during cervical spine surgery. Electronically Signed   By: Donavan Foil M.D.   On: 08/02/2018 14:10    Anti-infectives: Anti-infectives (From admission, onward)   Start     Dose/Rate Route Frequency Ordered Stop   08/02/18 2030  ceFAZolin (ANCEF) IVPB 1 g/50 mL premix     1 g 100 mL/hr over 30 Minutes Intravenous Every 8 hours 08/02/18 1513 08/03/18 1659   08/02/18 1330  bacitracin 50,000 Units in sodium chloride 0.9 % 500 mL irrigation  Status:  Discontinued       As needed 08/02/18 1331 08/02/18 1405   08/01/18 1155  ceFAZolin (ANCEF) IVPB 2g/100 mL premix     2 g 200 mL/hr over 30 Minutes Intravenous 30 min pre-op 08/01/18 1155 08/02/18 1234      Assessment/Plan: s/p Procedure(s): ANTERIOR CERVICAL DECOMPRESSION/DISCECTOMY FUSION CERVICAL FIVE- CERVICAL SIX Patient lives alone  Constipated  LOS: 3 days   Kathryne Eriksson. Dahlia Bailiff, MD, FACS [phone] Trauma Surgeon 08/03/2018
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
## 2018-08-04 MED ORDER — BISACODYL 10 MG RE SUPP
10.0000 mg | Freq: Once | RECTAL | Status: AC
  Administered 2018-08-04: 10 mg via RECTAL
  Filled 2018-08-04: qty 1

## 2018-08-04 MED ORDER — OXYCODONE HCL 5 MG PO TABS: 5.0000 mg | ORAL_TABLET | ORAL | Status: DC | PRN

## 2018-08-04 MED ORDER — HYDROMORPHONE HCL 1 MG/ML IJ SOLN: 0.5000 mg | INTRAMUSCULAR | Status: DC | PRN

## 2018-08-04 MED ORDER — ENOXAPARIN SODIUM 40 MG/0.4ML ~~LOC~~ SOLN
40.0000 mg | SUBCUTANEOUS | Status: DC
  Administered 2018-08-04: 40 mg via SUBCUTANEOUS
  Filled 2018-08-04: qty 0.4

## 2018-08-04 MED ORDER — METHOCARBAMOL 500 MG PO TABS: 500.0000 mg | ORAL_TABLET | Freq: Three times a day (TID) | ORAL | Status: DC | PRN
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
## 2018-08-04 NOTE — Progress Notes (Signed)
Postop day 2.  Patient doing very well.  No headache.  No neck pain.  Upper extremity symptoms resolved.  Still with some sternal discomfort.  Awake and alert.  Oriented and appropriate.  Motor and sensory function intact.  Wound clean and dry.  Doing very well following anterior cervical decompression and fusion.  Okay to for discharge from my standpoint.  Follow-up with me in 2 weeks.

## 2018-08-04 NOTE — Progress Notes (Signed)
Patient has bed offer at [facility] has requested insurance authorization for that facility. CSW will continue to follow up and notify patient/ RN/ MD once authorization has been received.   Kingsley Spittle, [place]  [phone]

## 2018-08-05 MED ORDER — ACETAMINOPHEN 325 MG PO TABS: 650.0000 mg | ORAL_TABLET | Freq: Three times a day (TID) | ORAL | Status: DC

## 2018-08-05 MED ORDER — IBUPROFEN 800 MG PO TABS: 800.0000 mg | ORAL_TABLET | Freq: Three times a day (TID) | ORAL | 0 refills | Status: DC

## 2018-08-05 MED ORDER — METHOCARBAMOL 500 MG PO TABS: 500.0000 mg | ORAL_TABLET | Freq: Three times a day (TID) | ORAL | Status: DC | PRN

## 2018-08-05 MED ORDER — DOCUSATE SODIUM 100 MG PO CAPS: 100.0000 mg | ORAL_CAPSULE | Freq: Two times a day (BID) | ORAL | 0 refills | Status: DC

## 2018-08-05 MED ORDER — POLYETHYLENE GLYCOL 3350 17 G PO PACK: 17.0000 g | PACK | Freq: Every day | ORAL | 0 refills | Status: DC

## 2018-08-05 MED ORDER — GABAPENTIN 300 MG PO CAPS: 300.0000 mg | ORAL_CAPSULE | Freq: Three times a day (TID) | ORAL | Status: DC

## 2018-08-05 MED ORDER — FAMOTIDINE 20 MG PO TABS: 20.0000 mg | ORAL_TABLET | Freq: Two times a day (BID) | ORAL | Status: DC
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
## 2018-08-05 NOTE — Progress Notes (Signed)
Patient is sitting up in the chair and awaiting PTAR for transfer.Patinet resp are even and unlabored and their are no changes from previous assessement. Patient VS are WNL, see flow sheet and denies any needs at this time. Patient is given a fresh drink and a box of tissues. Call bell remains w/in reach.

## 2018-08-05 NOTE — Progress Notes (Signed)
Julie Rollins is post-op day 3. She denies headache or neck pain. She reports no numbness or tingling in her upper extremities. She still has some sternal pain.  She is awake, alert, and oriented. Her motor and sensory function are intact. Her surgical site is clean and dry.  Julie Rollins is doing well following her ACDF of C5-C6. Her chest pain is coming from her sternal fracture, but is improving each day. Her plans are to discharge to [facility] this afternoon. I have given her a business card with [facility]'s information and advised her to call to set up an appointment for 2 weeks post-op.  [facility]

## 2018-08-05 NOTE — Progress Notes (Signed)
Patient has been accepted to [facility]- facility has received authorization for patient to discharge to facility today 9/12. CSW has notified MD- will call for transportation once dc summary is in. Patient will be going to room 215- please call report to [phone].   Kingsley Spittle, [place]  [phone]

## 2018-08-05 NOTE — Progress Notes (Signed)
Report called and given to Gwyndolyn Saxon at [facility] and [facility]. Patient aware of discharge and awaiting transport via [facility].

## 2018-08-05 NOTE — Progress Notes (Signed)
Trauma Service Note  Subjective: Patient doing great.  No distress.  Apparently has a bed for this afternoon  Objective: Vital signs in last 24 hours: Temp:  [97.7 F (36.5 C)-98 F (36.7 C)] 98 F (36.7 C) (09/12 0730) Pulse Rate:  [67-73] 67 (09/12 0730) Resp:  [15-20] 15 (09/12 0730) BP: (150-167)/(62-67) 167/67 (09/12 0730) SpO2:  [95 %-96 %] 95 % (09/12 0730) Last BM Date: 08/04/18  Intake/Output from previous day: 09/11 0701 - 09/12 0700 In: 240 [P.O.:240] Out: -  Intake/Output this shift: Total I/O In: 240 [P.O.:240] Out: -   General: No distress  Lungs: Clear to auscultation  Abd: Soft, benign  Extremities: No clinical signs or symptoms of DVT  Neuro: Intact  Lab Results: CBC  Recent Labs    08/03/18 0247  WBC 9.6  HGB 11.9*  HCT 36.6  PLT 219   BMET Recent Labs    08/03/18 0247  NA 139  K 3.3*  CL 105  CO2 21*  GLUCOSE 143*  BUN 8  CREATININE 0.49  CALCIUM 8.9   PT/INR No results for input(s): LABPROT, INR in the last 72 hours. ABG No results for input(s): PHART, HCO3 in the last 72 hours.  Invalid input(s): PCO2, PO2  Studies/Results: No results found.  Anti-infectives: Anti-infectives (From admission, onward)   Start     Dose/Rate Route Frequency Ordered Stop   08/02/18 2030  ceFAZolin (ANCEF) IVPB 1 g/50 mL premix     1 g 100 mL/hr over 30 Minutes Intravenous Every 8 hours 08/02/18 1513 08/03/18 1100   08/02/18 1330  bacitracin 50,000 Units in sodium chloride 0.9 % 500 mL irrigation  Status:  Discontinued       As needed 08/02/18 1331 08/02/18 1405   08/01/18 1155  ceFAZolin (ANCEF) IVPB 2g/100 mL premix     2 g 200 mL/hr over 30 Minutes Intravenous 30 min pre-op 08/01/18 1155 08/02/18 1234      Assessment/Plan: s/p Procedure(s): ANTERIOR CERVICAL DECOMPRESSION/DISCECTOMY FUSION CERVICAL FIVE- CERVICAL SIX Discharge SNF placement hopefully today.  LOS: 5 days   Kathryne Eriksson. Dahlia Bailiff, MD, FACS [phone] Trauma  Surgeon 08/05/2018

## 2018-08-05 NOTE — Progress Notes (Signed)
Patient is transferred [facility]. VS WNL see flow sheets. Patient denies any pain. Patient IV already D/c of prev shift , see IV assessments.

## 2018-08-11 ENCOUNTER — Encounter (INDEPENDENT_AMBULATORY_CARE_PROVIDER_SITE_OTHER): Payer: Medicare HMO | Admitting: Ophthalmology

## 2018-08-23 ENCOUNTER — Encounter (INDEPENDENT_AMBULATORY_CARE_PROVIDER_SITE_OTHER): Payer: Medicare HMO | Admitting: Ophthalmology

## 2018-09-27 ENCOUNTER — Encounter (INDEPENDENT_AMBULATORY_CARE_PROVIDER_SITE_OTHER): Payer: Medicare HMO | Admitting: Ophthalmology

## 2018-09-27 DIAGNOSIS — H35033 Hypertensive retinopathy, bilateral: Secondary | ICD-10-CM | POA: Diagnosis not present

## 2018-09-27 DIAGNOSIS — I10 Essential (primary) hypertension: Secondary | ICD-10-CM

## 2018-09-27 DIAGNOSIS — H43813 Vitreous degeneration, bilateral: Secondary | ICD-10-CM | POA: Diagnosis not present

## 2018-09-27 DIAGNOSIS — H33303 Unspecified retinal break, bilateral: Secondary | ICD-10-CM

## 2018-09-27 DIAGNOSIS — H59032 Cystoid macular edema following cataract surgery, left eye: Secondary | ICD-10-CM

## 2018-11-01 ENCOUNTER — Encounter (INDEPENDENT_AMBULATORY_CARE_PROVIDER_SITE_OTHER): Payer: Medicare HMO | Admitting: Ophthalmology

## 2018-11-01 DIAGNOSIS — H59033 Cystoid macular edema following cataract surgery, bilateral: Secondary | ICD-10-CM

## 2018-11-01 DIAGNOSIS — H33303 Unspecified retinal break, bilateral: Secondary | ICD-10-CM

## 2018-11-01 DIAGNOSIS — H35033 Hypertensive retinopathy, bilateral: Secondary | ICD-10-CM | POA: Diagnosis not present

## 2018-11-01 DIAGNOSIS — I10 Essential (primary) hypertension: Secondary | ICD-10-CM | POA: Diagnosis not present

## 2018-11-01 DIAGNOSIS — H43813 Vitreous degeneration, bilateral: Secondary | ICD-10-CM

## 2019-01-24 ENCOUNTER — Other Ambulatory Visit: Payer: Self-pay | Admitting: Internal Medicine

## 2019-01-24 DIAGNOSIS — Z1231 Encounter for screening mammogram for malignant neoplasm of breast: Secondary | ICD-10-CM

## 2019-02-22 ENCOUNTER — Ambulatory Visit: Payer: Medicare HMO

## 2019-03-28 ENCOUNTER — Encounter (HOSPITAL_COMMUNITY): Payer: Self-pay | Admitting: Emergency Medicine

## 2019-03-28 ENCOUNTER — Other Ambulatory Visit: Payer: Self-pay

## 2019-03-28 ENCOUNTER — Ambulatory Visit: Payer: Medicare HMO

## 2019-03-28 ENCOUNTER — Inpatient Hospital Stay (HOSPITAL_COMMUNITY)
Admission: EM | Admit: 2019-03-28 | Discharge: 2019-03-30 | DRG: 435 | Disposition: A | Discharge status: Home / Self Care | Payer: Medicare HMO | Attending: Family Medicine | Admitting: Family Medicine

## 2019-03-28 ENCOUNTER — Emergency Department (HOSPITAL_COMMUNITY): Payer: Medicare HMO

## 2019-03-28 DIAGNOSIS — F329 Major depressive disorder, single episode, unspecified: Secondary | ICD-10-CM | POA: Diagnosis present

## 2019-03-28 DIAGNOSIS — Z79899 Other long term (current) drug therapy: Secondary | ICD-10-CM

## 2019-03-28 DIAGNOSIS — R748 Abnormal levels of other serum enzymes: Secondary | ICD-10-CM | POA: Diagnosis not present

## 2019-03-28 DIAGNOSIS — C250 Malignant neoplasm of head of pancreas: Secondary | ICD-10-CM | POA: Diagnosis present

## 2019-03-28 DIAGNOSIS — R739 Hyperglycemia, unspecified: Secondary | ICD-10-CM | POA: Diagnosis present

## 2019-03-28 DIAGNOSIS — K5730 Diverticulosis of large intestine without perforation or abscess without bleeding: Secondary | ICD-10-CM | POA: Diagnosis present

## 2019-03-28 DIAGNOSIS — R17 Unspecified jaundice: Secondary | ICD-10-CM | POA: Diagnosis present

## 2019-03-28 DIAGNOSIS — Z20828 Contact with and (suspected) exposure to other viral communicable diseases: Secondary | ICD-10-CM | POA: Diagnosis present

## 2019-03-28 DIAGNOSIS — K8689 Other specified diseases of pancreas: Secondary | ICD-10-CM

## 2019-03-28 DIAGNOSIS — R933 Abnormal findings on diagnostic imaging of other parts of digestive tract: Secondary | ICD-10-CM | POA: Diagnosis not present

## 2019-03-28 DIAGNOSIS — K210 Gastro-esophageal reflux disease with esophagitis: Secondary | ICD-10-CM | POA: Diagnosis present

## 2019-03-28 DIAGNOSIS — E871 Hypo-osmolality and hyponatremia: Secondary | ICD-10-CM | POA: Diagnosis present

## 2019-03-28 DIAGNOSIS — Z853 Personal history of malignant neoplasm of breast: Secondary | ICD-10-CM | POA: Diagnosis not present

## 2019-03-28 DIAGNOSIS — R945 Abnormal results of liver function studies: Secondary | ICD-10-CM

## 2019-03-28 DIAGNOSIS — E039 Hypothyroidism, unspecified: Secondary | ICD-10-CM | POA: Diagnosis present

## 2019-03-28 DIAGNOSIS — R197 Diarrhea, unspecified: Secondary | ICD-10-CM | POA: Diagnosis not present

## 2019-03-28 DIAGNOSIS — F419 Anxiety disorder, unspecified: Secondary | ICD-10-CM | POA: Diagnosis present

## 2019-03-28 DIAGNOSIS — Z923 Personal history of irradiation: Secondary | ICD-10-CM

## 2019-03-28 DIAGNOSIS — Z885 Allergy status to narcotic agent status: Secondary | ICD-10-CM

## 2019-03-28 DIAGNOSIS — K831 Obstruction of bile duct: Secondary | ICD-10-CM | POA: Diagnosis present

## 2019-03-28 DIAGNOSIS — R531 Weakness: Secondary | ICD-10-CM | POA: Diagnosis not present

## 2019-03-28 DIAGNOSIS — Z79890 Hormone replacement therapy (postmenopausal): Secondary | ICD-10-CM

## 2019-03-28 DIAGNOSIS — Z8052 Family history of malignant neoplasm of bladder: Secondary | ICD-10-CM | POA: Diagnosis not present

## 2019-03-28 DIAGNOSIS — I10 Essential (primary) hypertension: Secondary | ICD-10-CM | POA: Diagnosis present

## 2019-03-28 DIAGNOSIS — E875 Hyperkalemia: Secondary | ICD-10-CM | POA: Diagnosis present

## 2019-03-28 DIAGNOSIS — E876 Hypokalemia: Secondary | ICD-10-CM | POA: Diagnosis present

## 2019-03-28 DIAGNOSIS — R7989 Other specified abnormal findings of blood chemistry: Secondary | ICD-10-CM | POA: Diagnosis present

## 2019-03-28 HISTORY — DX: Ventral hernia without obstruction or gangrene: K43.9

## 2019-03-28 LAB — C DIFFICILE QUICK SCREEN W PCR REFLEX
C Diff antigen: NEGATIVE
C Diff interpretation: NOT DETECTED
C Diff toxin: NEGATIVE

## 2019-03-28 LAB — HEMOGLOBIN A1C
Hgb A1c MFr Bld: 4.8 % (ref 4.8–5.6)
Mean Plasma Glucose: 91.06 mg/dL

## 2019-03-28 LAB — CBC WITH DIFFERENTIAL/PLATELET
Abs Immature Granulocytes: 0.04 10*3/uL (ref 0.00–0.07)
Basophils Absolute: 0 10*3/uL (ref 0.0–0.1)
Basophils Relative: 1 %
Eosinophils Absolute: 0.1 10*3/uL (ref 0.0–0.5)
Eosinophils Relative: 2 %
HCT: 34.8 % — ABNORMAL LOW (ref 36.0–46.0)
Hemoglobin: 11.8 g/dL — ABNORMAL LOW (ref 12.0–15.0)
Immature Granulocytes: 1 %
Lymphocytes Relative: 11 %
Lymphs Abs: 0.7 10*3/uL (ref 0.7–4.0)
MCH: 29.7 pg (ref 26.0–34.0)
MCHC: 33.9 g/dL (ref 30.0–36.0)
MCV: 87.7 fL (ref 80.0–100.0)
Monocytes Absolute: 0.4 10*3/uL (ref 0.1–1.0)
Monocytes Relative: 6 %
Neutro Abs: 5 10*3/uL (ref 1.7–7.7)
Neutrophils Relative %: 79 %
Platelets: 398 10*3/uL (ref 150–400)
RBC: 3.97 MIL/uL (ref 3.87–5.11)
RDW: 24.4 % — ABNORMAL HIGH (ref 11.5–15.5)
WBC: 6.3 10*3/uL (ref 4.0–10.5)
nRBC: 0 % (ref 0.0–0.2)

## 2019-03-28 LAB — BASIC METABOLIC PANEL
Anion gap: 10 (ref 5–15)
BUN: 6 mg/dL — ABNORMAL LOW (ref 8–23)
CO2: 22 mmol/L (ref 22–32)
Calcium: 9 mg/dL (ref 8.9–10.3)
Chloride: 103 mmol/L (ref 98–111)
Creatinine, Ser: 0.58 mg/dL (ref 0.44–1.00)
GFR calc Af Amer: >60 mL/min (ref >60)
GFR calc non Af Amer: >60 mL/min (ref >60)
Glucose, Bld: 160 mg/dL — ABNORMAL HIGH (ref 70–99)
Potassium: 3.5 mmol/L (ref 3.5–5.1)
Sodium: 135 mmol/L (ref 135–145)

## 2019-03-28 LAB — COMPREHENSIVE METABOLIC PANEL
ALT: 346 U/L — ABNORMAL HIGH (ref 0–44)
AST: 318 U/L — ABNORMAL HIGH (ref 15–41)
Albumin: 2.9 g/dL — ABNORMAL LOW (ref 3.5–5.0)
Alkaline Phosphatase: 1141 U/L — ABNORMAL HIGH (ref 38–126)
Anion gap: 9 (ref 5–15)
BUN: 6 mg/dL — ABNORMAL LOW (ref 8–23)
CO2: 23 mmol/L (ref 22–32)
Calcium: 9.2 mg/dL (ref 8.9–10.3)
Chloride: 100 mmol/L (ref 98–111)
Creatinine, Ser: 0.4 mg/dL — ABNORMAL LOW (ref 0.44–1.00)
GFR calc Af Amer: >60 mL/min (ref >60)
GFR calc non Af Amer: >60 mL/min (ref >60)
Glucose, Bld: 119 mg/dL — ABNORMAL HIGH (ref 70–99)
Potassium: 5.8 mmol/L — ABNORMAL HIGH (ref 3.5–5.1)
Sodium: 132 mmol/L — ABNORMAL LOW (ref 135–145)
Total Bilirubin: 23.9 mg/dL (ref 0.3–1.2)
Total Protein: 6.4 g/dL — ABNORMAL LOW (ref 6.5–8.1)

## 2019-03-28 LAB — PROTIME-INR
INR: 1.1 (ref 0.8–1.2)
Prothrombin Time: 14 seconds (ref 11.4–15.2)

## 2019-03-28 LAB — URINALYSIS, ROUTINE W REFLEX MICROSCOPIC
Glucose, UA: NEGATIVE mg/dL
Hgb urine dipstick: NEGATIVE
Ketones, ur: NEGATIVE mg/dL
Leukocytes,Ua: NEGATIVE
Nitrite: NEGATIVE
Protein, ur: NEGATIVE mg/dL
Specific Gravity, Urine: 1.019 (ref 1.005–1.030)
pH: 6 (ref 5.0–8.0)

## 2019-03-28 LAB — LIPASE, BLOOD: Lipase: 102 U/L — ABNORMAL HIGH (ref 11–51)

## 2019-03-28 LAB — TROPONIN I: Troponin I: 0.03 ng/mL (ref <0.03)

## 2019-03-28 LAB — I-STAT CREATININE, ED: Creatinine, Ser: 0.4 mg/dL — ABNORMAL LOW (ref 0.44–1.00)

## 2019-03-28 LAB — POTASSIUM: Potassium: 3.4 mmol/L — ABNORMAL LOW (ref 3.5–5.1)

## 2019-03-28 LAB — SARS CORONAVIRUS 2 BY RT PCR (HOSPITAL ORDER, PERFORMED IN ~~LOC~~ HOSPITAL LAB): SARS Coronavirus 2: NEGATIVE

## 2019-03-28 MED ORDER — DILTIAZEM HCL ER COATED BEADS 180 MG PO CP24
180.0000 mg | ORAL_CAPSULE | Freq: Every day | ORAL | Status: DC
  Administered 2019-03-29 – 2019-03-30 (×2): 180 mg via ORAL
  Filled 2019-03-28 (×2): qty 1

## 2019-03-28 MED ORDER — LEVOTHYROXINE SODIUM 50 MCG PO TABS
50.0000 ug | ORAL_TABLET | Freq: Every day | ORAL | Status: DC
  Administered 2019-03-29 – 2019-03-30 (×2): 50 ug via ORAL
  Filled 2019-03-28 (×2): qty 1

## 2019-03-28 MED ORDER — ONDANSETRON HCL 4 MG/2ML IJ SOLN: 4.0000 mg | Freq: Four times a day (QID) | INTRAMUSCULAR | Status: DC | PRN

## 2019-03-28 MED ORDER — ONDANSETRON HCL 4 MG/2ML IJ SOLN
4.0000 mg | Freq: Once | INTRAMUSCULAR | Status: AC
  Administered 2019-03-28: 08:00:00 4 mg via INTRAVENOUS
  Filled 2019-03-28: qty 2

## 2019-03-28 MED ORDER — FAMOTIDINE 20 MG PO TABS
20.0000 mg | ORAL_TABLET | Freq: Every day | ORAL | Status: DC
  Administered 2019-03-29 – 2019-03-30 (×2): 20 mg via ORAL
  Filled 2019-03-28 (×2): qty 1

## 2019-03-28 MED ORDER — ONDANSETRON HCL 4 MG PO TABS: 4.0000 mg | ORAL_TABLET | Freq: Four times a day (QID) | ORAL | Status: DC | PRN

## 2019-03-28 MED ORDER — SODIUM CHLORIDE 0.9 % IV SOLN
Freq: Once | INTRAVENOUS | Status: AC
  Administered 2019-03-28: 20:00:00 via INTRAVENOUS

## 2019-03-28 MED ORDER — CITALOPRAM HYDROBROMIDE 20 MG PO TABS
20.0000 mg | ORAL_TABLET | Freq: Every day | ORAL | Status: DC
  Administered 2019-03-28 – 2019-03-29 (×2): 20 mg via ORAL
  Filled 2019-03-28 (×2): qty 1

## 2019-03-28 MED ORDER — ENSURE ENLIVE PO LIQD
237.0000 mL | Freq: Two times a day (BID) | ORAL | Status: DC
  Administered 2019-03-29 – 2019-03-30 (×3): 237 mL via ORAL

## 2019-03-28 MED ORDER — IOHEXOL 300 MG/ML  SOLN
100.0000 mL | Freq: Once | INTRAMUSCULAR | Status: AC | PRN
  Administered 2019-03-28: 100 mL via INTRAVENOUS

## 2019-03-28 MED ORDER — SODIUM CHLORIDE 0.9 % IV BOLUS
1000.0000 mL | Freq: Once | INTRAVENOUS | Status: AC
  Administered 2019-03-28: 1000 mL via INTRAVENOUS

## 2019-03-28 MED ORDER — POTASSIUM CHLORIDE CRYS ER 20 MEQ PO TBCR
30.0000 meq | EXTENDED_RELEASE_TABLET | Freq: Once | ORAL | Status: AC
  Administered 2019-03-28: 30 meq via ORAL
  Filled 2019-03-28: qty 1

## 2019-03-28 MED ORDER — GABAPENTIN 300 MG PO CAPS
300.0000 mg | ORAL_CAPSULE | Freq: Three times a day (TID) | ORAL | Status: DC
  Administered 2019-03-28 – 2019-03-29 (×2): 300 mg via ORAL
  Filled 2019-03-28 (×4): qty 1

## 2019-03-28 NOTE — H&P (View-Only) (Signed)
Referring Provider: [facility]   Primary Care Physician:  Nolene Ebbs, MD Primary Gastroenterologist: Dr. Silverio Decamp     Reason for Consultation:   jaundice    ASSESSMENT / PLAN:    27. 73 year old female with obstructive jaundice/pancreatic head lesion on CT scan today.  -Afebrile, normal WBC but does report intermittent chills at home.  -Patient will need ERCP / EUS. I have spoken with Dr. Ardis Hughs who will perform procedures in am.   2. Loose stool. Vague description. Was having more frequent but not diarrheal stools over the last month but now more loose after completion of antibiotics (UTI) four days ago. Reports loose stool this am.  -will check for c-diff.  3. Mild hypokalemia, K+3.4.  -will give 30 mEq K+x1 now given that she is for procedures with anesthesia tomorrow.    HPI:    Julie Rollins is a 73 y.o. female who lives alone, her only family member is that of a brother who lives in a nursing home.  She has a history of breast cancer, hypertension, hypothyroidism and anxiety.  Patient presented to the ED today with weakness, loose stools and jaundice.   ED Evaluation:  Alk phos 1141, AST 318, ALT 346, total bilirubin 24, lipase 102, sodium 132, potassium 3.4, BUN 6, creatinine 3.4, WBC 6.3, hemoglobin 11.8  CTAP w/ contrast-20 x 14 mm ill-defined low-density in the pancreatic head concerning for possible neoplasm.  Severe intrahepatic/extrahepatic biliary duct dilation  Patient really does not know how long she has been jaundice.  She planes of nausea, intermittent vomiting.  Endorses generalized, intermittent abdominal pain unrelated to eating or bowel movements.  The pain is described as mild.  Patient denies fever but does report chills.  She is not sure about weight loss but suspects there has been some.  Patient is typically constipated.  Over the last month she has had increased frequency of stools but not what she would characterizes diarrhea.  4 days ago  she did complete antibiotics for UTI, stools of been a little looser since then.    Past Medical History:  Diagnosis Date   Anxiety    Blood transfusion without reported diagnosis    Breast CA ([place])    s/p lumpectomy and radiation 2000   Colitis    2007   HTN (hypertension)    "mild hypertension", was on BP med years ago but it caused orthostatic hypotension and she has not been medicated since   Hypothyroid    Personal history of radiation therapy 2001   Vertigo    Vertigo     Past Surgical History:  Procedure Laterality Date   ABDOMINAL HYSTERECTOMY     26yr ago   ANTERIOR CERVICAL DECOMP/DISCECTOMY FUSION N/A 08/02/2018   Procedure: ANTERIOR CERVICAL DECOMPRESSION/DISCECTOMY FUSION CERVICAL FIVE- CERVICAL SIX;  Surgeon: PEarnie Larsson MD;  [facility]  Service: Neurosurgery;  Laterality: N/A;  ANTERIOR CERVICAL DECOMPRESSION/DISCECTOMY FUSION CERVICAL FIVE- CERVICAL SIX   BREAST LUMPECTOMY     2000   HERNIA REPAIR     20+ years ago   RETINAL DETACHMENT SURGERY      Prior to Admission medications   Medication Sig Start Date End Date Taking? Authorizing Provider  acetaminophen (TYLENOL) 325 MG tablet Take 2 tablets (650 mg total) by mouth every 8 (eight) hours. 08/05/18   Meuth, Brooke A, PA-C  Aspirin-Acetaminophen-Caffeine (GOODY HEADACHE PO) Take 1 Package by mouth daily as needed (headache).    [provider]  citalopram (CELEXA) 20 MG  tablet Take 20 mg by mouth at bedtime.    [provider]  diltiazem (CARDIZEM CD) 180 MG 24 hr capsule Take 180 mg by mouth daily. 05/18/18   [provider]  docusate sodium (COLACE) 100 MG capsule Take 1 capsule (100 mg total) by mouth 2 (two) times daily. 08/05/18   Meuth, Brooke A, PA-C  DUREZOL 0.05 % EMUL Place 1 drop into both eyes 2 (two) times daily. 07/14/18   [provider]  famotidine (PEPCID) 20 MG tablet Take 1 tablet (20 mg total) by mouth 2 (two) times daily. 08/05/18    Meuth, Brooke A, PA-C  gabapentin (NEURONTIN) 300 MG capsule Take 1 capsule (300 mg total) by mouth 3 (three) times daily. 08/05/18   Meuth, Brooke A, PA-C  ibuprofen (ADVIL,MOTRIN) 800 MG tablet Take 1 tablet (800 mg total) by mouth 3 (three) times daily. 08/05/18   Meuth, Brooke A, PA-C  levothyroxine (SYNTHROID, LEVOTHROID) 50 MCG tablet Take 50 mcg by mouth every morning.    [provider]  methocarbamol (ROBAXIN) 500 MG tablet Take 1 tablet (500 mg total) by mouth every 8 (eight) hours as needed for muscle spasms. 08/05/18   Meuth, Brooke A, PA-C  ofloxacin (OCUFLOX) 0.3 % ophthalmic solution Place 1 drop into both eyes 2 (two) times daily. 07/14/18   [provider]  polyethylene glycol (MIRALAX / GLYCOLAX) packet Take 17 g by mouth daily. 08/06/18   Meuth, Brooke A, PA-C    No current facility-administered medications for this encounter.    Current Outpatient Medications  Medication Sig Dispense Refill   acetaminophen (TYLENOL) 325 MG tablet Take 2 tablets (650 mg total) by mouth every 8 (eight) hours.     Aspirin-Acetaminophen-Caffeine (GOODY HEADACHE PO) Take 1 Package by mouth daily as needed (headache).     citalopram (CELEXA) 20 MG tablet Take 20 mg by mouth at bedtime.     diltiazem (CARDIZEM CD) 180 MG 24 hr capsule Take 180 mg by mouth daily.     docusate sodium (COLACE) 100 MG capsule Take 1 capsule (100 mg total) by mouth 2 (two) times daily. 10 capsule 0   DUREZOL 0.05 % EMUL Place 1 drop into both eyes 2 (two) times daily.  1   famotidine (PEPCID) 20 MG tablet Take 1 tablet (20 mg total) by mouth 2 (two) times daily.     gabapentin (NEURONTIN) 300 MG capsule Take 1 capsule (300 mg total) by mouth 3 (three) times daily.     ibuprofen (ADVIL,MOTRIN) 800 MG tablet Take 1 tablet (800 mg total) by mouth 3 (three) times daily. 30 tablet 0   levothyroxine (SYNTHROID, LEVOTHROID) 50 MCG tablet Take 50 mcg by mouth every morning.     methocarbamol (ROBAXIN)  500 MG tablet Take 1 tablet (500 mg total) by mouth every 8 (eight) hours as needed for muscle spasms.     ofloxacin (OCUFLOX) 0.3 % ophthalmic solution Place 1 drop into both eyes 2 (two) times daily.  1   polyethylene glycol (MIRALAX / GLYCOLAX) packet Take 17 g by mouth daily. 14 each 0    Allergies as of 03/28/2019 - Review Complete 03/28/2019  Allergen Reaction Noted   Codeine Nausea Only 02/12/2012    Family History  Problem Relation Age of Onset   Bladder Cancer Brother    Other Other        Denies family h/o cardiac disease    Social History   Socioeconomic History   Marital status: Divorced  Spouse name: Not on file   Number of children: Not on file   Years of education: Not on file   Highest education level: Not on file  Occupational History   Occupation: retired    Comment: [place] resource strain: Not on file   Food insecurity:    Worry: Not on file    Inability: Not on file   Transportation needs:    Medical: Not on file    Non-medical: Not on file  Tobacco Use   Smoking status: Never Smoker   Smokeless tobacco: Never Used  Substance and Sexual Activity   Alcohol use: No   Drug use: No   Sexual activity: Not on file  Lifestyle   Physical activity:    Days per week: Not on file    Minutes per session: Not on file   Stress: Not on file  Relationships   Social connections:    Talks on phone: Not on file    Gets together: Not on file    Attends religious service: Not on file    Active member of club or organization: Not on file    Attends meetings of clubs or organizations: Not on file    Relationship status: Not on file   Intimate partner violence:    Fear of current or ex partner: Not on file    Emotionally abused: Not on file    Physically abused: Not on file    Forced sexual activity: Not on file  Other Topics Concern   Not on file  Social History Narrative   Lives alone    Review  of Systems: All systems reviewed and negative except where noted in HPI.  Physical Exam: Vital signs in last 24 hours: Temp:  [98 F (36.7 C)] 98 F (36.7 C) (05/04 0743) Pulse Rate:  [74-97] 97 (05/04 1130) Resp:  [14-19] 16 (05/04 1130) BP: (128-152)/(55-74) 146/68 (05/04 1130) SpO2:  [98 %-100 %] 98 % (05/04 1130) Weight:  [63.5 kg] 63.5 kg (05/04 0743)   General:   Alert, deeply jaundiced well-developed,  Female in NAD Psych:  Pleasant, cooperative. Normal mood and affect. Eyes:  Pupils equal, + icteric sclera clear.   Conjunctiva pink. Ears:  Normal auditory acuity. Nose:  No deformity, discharge,  or lesions. Neck:  Supple; no masses Lungs:  Clear throughout to auscultation.   No wheezes, crackles, or rhonchi.  Heart:  Regular rate and rhythm; no murmurs, trace bilateral lower extremity edema Abdomen:  Soft, non-distended, nontender, BS active, no palp mass    Rectal:  Deferred  Msk:  Symmetrical without gross deformities. . Neurologic:  Alert and  oriented x4;  grossly normal neurologically. Skin:  Intact without significant lesions or rashes.   Intake/Output from previous day: No intake/output data recorded. Intake/Output this shift: Total I/O In: 1000 [IV Piggyback:1000] Out: -   Lab Results: Recent Labs    03/28/19 0800  WBC 6.3  HGB 11.8*  HCT 34.8*  PLT 398   BMET Recent Labs    03/28/19 0800 03/28/19 0818 03/28/19 1052  NA 132*  --   --   K 5.8*  --  3.4*  CL 100  --   --   CO2 23  --   --   GLUCOSE 119*  --   --   BUN 6*  --   --   CREATININE 0.40* 0.40*  --   CALCIUM 9.2  --   --  LFT Recent Labs    03/28/19 0800  PROT 6.4*  ALBUMIN 2.9*  AST 318*  ALT 346*  ALKPHOS 1,141*  BILITOT 23.9*     Studies/Results: Ct Abdomen Pelvis W Contrast  Result Date: 03/28/2019 CLINICAL DATA:  Jaundice. EXAM: CT ABDOMEN AND PELVIS WITH CONTRAST TECHNIQUE: Multidetector CT imaging of the abdomen and pelvis was performed using the standard  protocol following bolus administration of intravenous contrast. CONTRAST:  19m OMNIPAQUE IOHEXOL 300 MG/ML  SOLN COMPARISON:  CT scan of July 31, 2018. FINDINGS: Lower chest: No acute abnormality. Hepatobiliary: There is interval development of severe intrahepatic and extrahepatic biliary dilatation it appears to be due to possible pancreatic neoplasm. Gallbladder distention is noted without cholelithiasis. No gallbladder wall thickening or pericholecystic fluid is noted. No definite hepatic mass is noted. Pancreas: 20 x 14 mm ill-defined low density is noted in pancreatic head concerning for possible neoplasm. Mild ductal dilatation is noted. No evidence of pancreatic inflammation is noted. Spleen: Normal in size without focal abnormality. Adrenals/Urinary Tract: Adrenal glands are unremarkable. Kidneys are normal, without renal calculi, focal lesion, or hydronephrosis. Bladder is unremarkable. Stomach/Bowel: Stomach is within normal limits. Appendix appears normal. No evidence of bowel wall thickening, distention, or inflammatory changes. Sigmoid diverticulosis is noted without inflammation. Vascular/Lymphatic: No significant vascular findings are present. No enlarged abdominal or pelvic lymph nodes. Reproductive: Status post hysterectomy. No adnexal masses. Other: No abdominal wall hernia or abnormality. No abdominopelvic ascites. Musculoskeletal: No acute or significant osseous findings. IMPRESSION: Interval development of 20 x 14 mm ill-defined low density in pancreatic head concerning for possible neoplasm or malignancy. This appears to be resulting in severe intrahepatic and extrahepatic biliary dilatation. ERCP is recommended for further evaluation. Sigmoid diverticulosis without inflammation. Electronically Signed   By: JMarijo ConceptionM.D.   On: 03/28/2019 09:40     PTye Savoy NP-C @  03/28/2019, 11:47 AM

## 2019-03-28 NOTE — Consult Note (Addendum)
Referring Provider: [facility]   Primary Care Physician:  Nolene Ebbs, MD Primary Gastroenterologist: Dr. Silverio Decamp     Reason for Consultation:   jaundice    ASSESSMENT / PLAN:    11. 73 year old female with obstructive jaundice/pancreatic head lesion on CT scan today.  -Afebrile, normal WBC but does report intermittent chills at home.  -Patient will need ERCP / EUS. I have spoken with Dr. Ardis Hughs who will perform procedures in am.   2. Loose stool. Vague description. Was having more frequent but not diarrheal stools over the last month but now more loose after completion of antibiotics (UTI) four days ago. Reports loose stool this am.  -will check for c-diff.  3. Mild hypokalemia, K+3.4.  -will give 30 mEq K+x1 now given that she is for procedures with anesthesia tomorrow.    HPI:    Julie Rollins is a 73 y.o. female who lives alone, her only family member is that of a brother who lives in a nursing home.  She has a history of breast cancer, hypertension, hypothyroidism and anxiety.  Patient presented to the ED today with weakness, loose stools and jaundice.   ED Evaluation:  Alk phos 1141, AST 318, ALT 346, total bilirubin 24, lipase 102, sodium 132, potassium 3.4, BUN 6, creatinine 3.4, WBC 6.3, hemoglobin 11.8  CTAP w/ contrast-20 x 14 mm ill-defined low-density in the pancreatic head concerning for possible neoplasm.  Severe intrahepatic/extrahepatic biliary duct dilation  Patient really does not know how long she has been jaundice.  She planes of nausea, intermittent vomiting.  Endorses generalized, intermittent abdominal pain unrelated to eating or bowel movements.  The pain is described as mild.  Patient denies fever but does report chills.  She is not sure about weight loss but suspects there has been some.  Patient is typically constipated.  Over the last month she has had increased frequency of stools but not what she would characterizes diarrhea.  4 days ago  she did complete antibiotics for UTI, stools of been a little looser since then.    Past Medical History:  Diagnosis Date   Anxiety    Blood transfusion without reported diagnosis    Breast CA ([place])    s/p lumpectomy and radiation 2000   Colitis    2007   HTN (hypertension)    "mild hypertension", was on BP med years ago but it caused orthostatic hypotension and she has not been medicated since   Hypothyroid    Personal history of radiation therapy 2001   Vertigo    Vertigo     Past Surgical History:  Procedure Laterality Date   ABDOMINAL HYSTERECTOMY     39yr ago   ANTERIOR CERVICAL DECOMP/DISCECTOMY FUSION N/A 08/02/2018   Procedure: ANTERIOR CERVICAL DECOMPRESSION/DISCECTOMY FUSION CERVICAL FIVE- CERVICAL SIX;  Surgeon: PEarnie Larsson MD;  [facility]  Service: Neurosurgery;  Laterality: N/A;  ANTERIOR CERVICAL DECOMPRESSION/DISCECTOMY FUSION CERVICAL FIVE- CERVICAL SIX   BREAST LUMPECTOMY     2000   HERNIA REPAIR     20+ years ago   RETINAL DETACHMENT SURGERY      Prior to Admission medications   Medication Sig Start Date End Date Taking? Authorizing Provider  acetaminophen (TYLENOL) 325 MG tablet Take 2 tablets (650 mg total) by mouth every 8 (eight) hours. 08/05/18   Meuth, Brooke A, PA-C  Aspirin-Acetaminophen-Caffeine (GOODY HEADACHE PO) Take 1 Package by mouth daily as needed (headache).    [provider]  citalopram (CELEXA) 20 MG  tablet Take 20 mg by mouth at bedtime.    [provider]  diltiazem (CARDIZEM CD) 180 MG 24 hr capsule Take 180 mg by mouth daily. 05/18/18   [provider]  docusate sodium (COLACE) 100 MG capsule Take 1 capsule (100 mg total) by mouth 2 (two) times daily. 08/05/18   Meuth, Brooke A, PA-C  DUREZOL 0.05 % EMUL Place 1 drop into both eyes 2 (two) times daily. 07/14/18   [provider]  famotidine (PEPCID) 20 MG tablet Take 1 tablet (20 mg total) by mouth 2 (two) times daily. 08/05/18    Meuth, Brooke A, PA-C  gabapentin (NEURONTIN) 300 MG capsule Take 1 capsule (300 mg total) by mouth 3 (three) times daily. 08/05/18   Meuth, Brooke A, PA-C  ibuprofen (ADVIL,MOTRIN) 800 MG tablet Take 1 tablet (800 mg total) by mouth 3 (three) times daily. 08/05/18   Meuth, Brooke A, PA-C  levothyroxine (SYNTHROID, LEVOTHROID) 50 MCG tablet Take 50 mcg by mouth every morning.    [provider]  methocarbamol (ROBAXIN) 500 MG tablet Take 1 tablet (500 mg total) by mouth every 8 (eight) hours as needed for muscle spasms. 08/05/18   Meuth, Brooke A, PA-C  ofloxacin (OCUFLOX) 0.3 % ophthalmic solution Place 1 drop into both eyes 2 (two) times daily. 07/14/18   [provider]  polyethylene glycol (MIRALAX / GLYCOLAX) packet Take 17 g by mouth daily. 08/06/18   Meuth, Brooke A, PA-C    No current facility-administered medications for this encounter.    Current Outpatient Medications  Medication Sig Dispense Refill   acetaminophen (TYLENOL) 325 MG tablet Take 2 tablets (650 mg total) by mouth every 8 (eight) hours.     Aspirin-Acetaminophen-Caffeine (GOODY HEADACHE PO) Take 1 Package by mouth daily as needed (headache).     citalopram (CELEXA) 20 MG tablet Take 20 mg by mouth at bedtime.     diltiazem (CARDIZEM CD) 180 MG 24 hr capsule Take 180 mg by mouth daily.     docusate sodium (COLACE) 100 MG capsule Take 1 capsule (100 mg total) by mouth 2 (two) times daily. 10 capsule 0   DUREZOL 0.05 % EMUL Place 1 drop into both eyes 2 (two) times daily.  1   famotidine (PEPCID) 20 MG tablet Take 1 tablet (20 mg total) by mouth 2 (two) times daily.     gabapentin (NEURONTIN) 300 MG capsule Take 1 capsule (300 mg total) by mouth 3 (three) times daily.     ibuprofen (ADVIL,MOTRIN) 800 MG tablet Take 1 tablet (800 mg total) by mouth 3 (three) times daily. 30 tablet 0   levothyroxine (SYNTHROID, LEVOTHROID) 50 MCG tablet Take 50 mcg by mouth every morning.     methocarbamol (ROBAXIN)  500 MG tablet Take 1 tablet (500 mg total) by mouth every 8 (eight) hours as needed for muscle spasms.     ofloxacin (OCUFLOX) 0.3 % ophthalmic solution Place 1 drop into both eyes 2 (two) times daily.  1   polyethylene glycol (MIRALAX / GLYCOLAX) packet Take 17 g by mouth daily. 14 each 0    Allergies as of 03/28/2019 - Review Complete 03/28/2019  Allergen Reaction Noted   Codeine Nausea Only 02/12/2012    Family History  Problem Relation Age of Onset   Bladder Cancer Brother    Other Other        Denies family h/o cardiac disease    Social History   Socioeconomic History   Marital status: Divorced  Spouse name: Not on file   Number of children: Not on file   Years of education: Not on file   Highest education level: Not on file  Occupational History   Occupation: retired    Comment: [place] resource strain: Not on file   Food insecurity:    Worry: Not on file    Inability: Not on file   Transportation needs:    Medical: Not on file    Non-medical: Not on file  Tobacco Use   Smoking status: Never Smoker   Smokeless tobacco: Never Used  Substance and Sexual Activity   Alcohol use: No   Drug use: No   Sexual activity: Not on file  Lifestyle   Physical activity:    Days per week: Not on file    Minutes per session: Not on file   Stress: Not on file  Relationships   Social connections:    Talks on phone: Not on file    Gets together: Not on file    Attends religious service: Not on file    Active member of club or organization: Not on file    Attends meetings of clubs or organizations: Not on file    Relationship status: Not on file   Intimate partner violence:    Fear of current or ex partner: Not on file    Emotionally abused: Not on file    Physically abused: Not on file    Forced sexual activity: Not on file  Other Topics Concern   Not on file  Social History Narrative   Lives alone    Review  of Systems: All systems reviewed and negative except where noted in HPI.  Physical Exam: Vital signs in last 24 hours: Temp:  [98 F (36.7 C)] 98 F (36.7 C) (05/04 0743) Pulse Rate:  [74-97] 97 (05/04 1130) Resp:  [14-19] 16 (05/04 1130) BP: (128-152)/(55-74) 146/68 (05/04 1130) SpO2:  [98 %-100 %] 98 % (05/04 1130) Weight:  [63.5 kg] 63.5 kg (05/04 0743)   General:   Alert, deeply jaundiced well-developed,  Female in NAD Psych:  Pleasant, cooperative. Normal mood and affect. Eyes:  Pupils equal, + icteric sclera clear.   Conjunctiva pink. Ears:  Normal auditory acuity. Nose:  No deformity, discharge,  or lesions. Neck:  Supple; no masses Lungs:  Clear throughout to auscultation.   No wheezes, crackles, or rhonchi.  Heart:  Regular rate and rhythm; no murmurs, trace bilateral lower extremity edema Abdomen:  Soft, non-distended, nontender, BS active, no palp mass    Rectal:  Deferred  Msk:  Symmetrical without gross deformities. . Neurologic:  Alert and  oriented x4;  grossly normal neurologically. Skin:  Intact without significant lesions or rashes.   Intake/Output from previous day: No intake/output data recorded. Intake/Output this shift: Total I/O In: 1000 [IV Piggyback:1000] Out: -   Lab Results: Recent Labs    03/28/19 0800  WBC 6.3  HGB 11.8*  HCT 34.8*  PLT 398   BMET Recent Labs    03/28/19 0800 03/28/19 0818 03/28/19 1052  NA 132*  --   --   K 5.8*  --  3.4*  CL 100  --   --   CO2 23  --   --   GLUCOSE 119*  --   --   BUN 6*  --   --   CREATININE 0.40* 0.40*  --   CALCIUM 9.2  --   --  LFT Recent Labs    03/28/19 0800  PROT 6.4*  ALBUMIN 2.9*  AST 318*  ALT 346*  ALKPHOS 1,141*  BILITOT 23.9*     Studies/Results: Ct Abdomen Pelvis W Contrast  Result Date: 03/28/2019 CLINICAL DATA:  Jaundice. EXAM: CT ABDOMEN AND PELVIS WITH CONTRAST TECHNIQUE: Multidetector CT imaging of the abdomen and pelvis was performed using the standard  protocol following bolus administration of intravenous contrast. CONTRAST:  155m OMNIPAQUE IOHEXOL 300 MG/ML  SOLN COMPARISON:  CT scan of July 31, 2018. FINDINGS: Lower chest: No acute abnormality. Hepatobiliary: There is interval development of severe intrahepatic and extrahepatic biliary dilatation it appears to be due to possible pancreatic neoplasm. Gallbladder distention is noted without cholelithiasis. No gallbladder wall thickening or pericholecystic fluid is noted. No definite hepatic mass is noted. Pancreas: 20 x 14 mm ill-defined low density is noted in pancreatic head concerning for possible neoplasm. Mild ductal dilatation is noted. No evidence of pancreatic inflammation is noted. Spleen: Normal in size without focal abnormality. Adrenals/Urinary Tract: Adrenal glands are unremarkable. Kidneys are normal, without renal calculi, focal lesion, or hydronephrosis. Bladder is unremarkable. Stomach/Bowel: Stomach is within normal limits. Appendix appears normal. No evidence of bowel wall thickening, distention, or inflammatory changes. Sigmoid diverticulosis is noted without inflammation. Vascular/Lymphatic: No significant vascular findings are present. No enlarged abdominal or pelvic lymph nodes. Reproductive: Status post hysterectomy. No adnexal masses. Other: No abdominal wall hernia or abnormality. No abdominopelvic ascites. Musculoskeletal: No acute or significant osseous findings. IMPRESSION: Interval development of 20 x 14 mm ill-defined low density in pancreatic head concerning for possible neoplasm or malignancy. This appears to be resulting in severe intrahepatic and extrahepatic biliary dilatation. ERCP is recommended for further evaluation. Sigmoid diverticulosis without inflammation. Electronically Signed   By: JMarijo ConceptionM.D.   On: 03/28/2019 09:40     PTye Savoy NP-C @  03/28/2019, 11:47 AM

## 2019-03-28 NOTE — ED Notes (Signed)
Pt given coke, regular tray ordered by Network engineer.

## 2019-03-28 NOTE — ED Notes (Signed)
ED TO INPATIENT HANDOFF REPORT  ED Nurse Name and Phone #: [phone]  S Name/Age/Gender Julie Rollins 73 y.o. female Room/Bed: 040C/040C  Code Status   Code Status: Full Code  Home/SNF/Other Home Patient oriented to: A & O X4 Is this baseline? Yes   Triage Complete: Triage complete  Chief Complaint Gen Weakness; N/D  Triage Note Pt arrives via EMS from home with reports of gen weakness. Endorses diarrhea and nausea x1 month. Pt jaundice and reports this started about a week ago. Pt reports dark urine and just finished an abx    Allergies Allergies  Allergen Reactions  . Codeine Nausea Only    Level of Care/Admitting Diagnosis ED Disposition    ED Disposition Condition [facility]: [place] [[id number]]  Level of Care: Telemetry Medical [104]  Covid Evaluation: N/A  Diagnosis: Pancreatic mass [836629]  Admitting Physician: Jonnie Finner [[id number]]  Attending Physician: Jonnie Finner [[id number]]  Estimated length of stay: past midnight tomorrow  Certification:: I certify this patient will need inpatient services for at least 2 midnights  PT Class (Do Not Modify): Inpatient [101]  PT Acc Code (Do Not Modify): Private [1]       B Medical/Surgery History Past Medical History:  Diagnosis Date  . Anxiety   . Blood transfusion without reported diagnosis   . Breast CA ([place])    s/p lumpectomy and radiation 2000  . Colitis    2007  . HTN (hypertension)    "mild hypertension", was on BP med years ago but it caused orthostatic hypotension and she has not been medicated since  . Hypothyroid   . Personal history of radiation therapy 2001  . Vertigo   . Vertigo    Past Surgical History:  Procedure Laterality Date  . ABDOMINAL HYSTERECTOMY     18yrs ago  . ANTERIOR CERVICAL DECOMP/DISCECTOMY FUSION N/A 08/02/2018   Procedure: ANTERIOR CERVICAL DECOMPRESSION/DISCECTOMY FUSION CERVICAL FIVE- CERVICAL SIX;  Surgeon: Earnie Larsson, MD;   Location: [facility];  Service: Neurosurgery;  Laterality: N/A;  ANTERIOR CERVICAL DECOMPRESSION/DISCECTOMY FUSION CERVICAL FIVE- CERVICAL SIX  . BREAST LUMPECTOMY     2000  . HERNIA REPAIR     20+ years ago  . RETINAL DETACHMENT SURGERY       A IV Location/Drains/Wounds Patient Lines/Drains/Airways Status   Active Line/Drains/Airways    Name:   Placement date:   Placement time:   Site:   Days:   Peripheral IV 03/28/19 Right Antecubital   03/28/19    0813    Antecubital   less than 1   Peripheral IV 03/28/19 Right Forearm   03/28/19    0952    Forearm   less than 1   External Urinary Catheter   08/02/18    0830    -   238   Incision (Closed) 08/02/18 Neck   08/02/18    1357     238          Intake/Output Last 24 hours  Intake/Output Summary (Last 24 hours) at 03/28/2019 1733 Last data filed at 03/28/2019 1125 Gross per 24 hour  Intake 1000 ml  Output -  Net 1000 ml    Labs/Imaging Results for orders placed or performed during the hospital encounter of 03/28/19 (from the past 48 hour(s))  Comprehensive metabolic panel     Status: Abnormal   Collection Time: 03/28/19  8:00 AM  Result Value Ref Range   Sodium 132 (L) 135 -  145 mmol/L   Potassium 5.8 (H) 3.5 - 5.1 mmol/L   Chloride 100 98 - 111 mmol/L   CO2 23 22 - 32 mmol/L   Glucose, Bld 119 (H) 70 - 99 mg/dL   BUN 6 (L) 8 - 23 mg/dL   Creatinine, Ser 0.40 (L) 0.44 - 1.00 mg/dL   Calcium 9.2 8.9 - 10.3 mg/dL   Total Protein 6.4 (L) 6.5 - 8.1 g/dL   Albumin 2.9 (L) 3.5 - 5.0 g/dL   AST 318 (H) 15 - 41 U/L   ALT 346 (H) 0 - 44 U/L    Comment: RESULTS CONFIRMED BY MANUAL DILUTION   Alkaline Phosphatase 1,141 (H) 38 - 126 U/L   Total Bilirubin 23.9 (HH) 0.3 - 1.2 mg/dL    Comment: CRITICAL RESULT CALLED TO, READ BACK BY AND VERIFIED WITH: Ivin Poot RN [phone] BY A BENNETT    GFR calc non Af Amer >60 >60 mL/min   GFR calc Af Amer >60 >60 mL/min   Anion gap 9 5 - 15    Comment: Performed at [facility], [address] [place], [place] [address]  CBC with Differential     Status: Abnormal   Collection Time: 03/28/19  8:00 AM  Result Value Ref Range   WBC 6.3 4.0 - 10.5 K/uL   RBC 3.97 3.87 - 5.11 MIL/uL   Hemoglobin 11.8 (L) 12.0 - 15.0 g/dL   HCT 34.8 (L) 36.0 - 46.0 %   MCV 87.7 80.0 - 100.0 fL   MCH 29.7 26.0 - 34.0 pg   MCHC 33.9 30.0 - 36.0 g/dL   RDW 24.4 (H) 11.5 - 15.5 %   Platelets 398 150 - 400 K/uL   nRBC 0.0 0.0 - 0.2 %   Neutrophils Relative % 79 %   Neutro Abs 5.0 1.7 - 7.7 K/uL   Lymphocytes Relative 11 %   Lymphs Abs 0.7 0.7 - 4.0 K/uL   Monocytes Relative 6 %   Monocytes Absolute 0.4 0.1 - 1.0 K/uL   Eosinophils Relative 2 %   Eosinophils Absolute 0.1 0.0 - 0.5 K/uL   Basophils Relative 1 %   Basophils Absolute 0.0 0.0 - 0.1 K/uL   Immature Granulocytes 1 %   Abs Immature Granulocytes 0.04 0.00 - 0.07 K/uL   Polychromasia PRESENT    Target Cells PRESENT     Comment: Performed at [facility], [place] [address], [place], [place] [address]  Lipase, blood     Status: Abnormal   Collection Time: 03/28/19  8:00 AM  Result Value Ref Range   Lipase 102 (H) 11 - 51 U/L    Comment: Performed at [facility], [place] [address] [place], [place] [address]  Troponin I - Once     Status: Abnormal   Collection Time: 03/28/19  8:00 AM  Result Value Ref Range   Troponin I 0.03 (HH) <0.03 ng/mL    Comment: CRITICAL RESULT CALLED TO, READ BACK BY AND VERIFIED WITH: Ivin Poot RN [phone] BY A BENNETT Performed at [facility], [place] [address] [place], [place] [address]   I-stat Creatinine, ED     Status: Abnormal   Collection Time: 03/28/19  8:18 AM  Result Value Ref Range   Creatinine, Ser 0.40 (L) 0.44 - 1.00 mg/dL  Urinalysis, Routine w reflex microscopic     Status: Abnormal   Collection Time: 03/28/19  9:39 AM  Result Value Ref Range  Color, Urine AMBER (A) YELLOW    Comment: BIOCHEMICALS MAY BE AFFECTED BY COLOR   APPearance CLEAR CLEAR   Specific  Gravity, Urine 1.019 1.005 - 1.030   pH 6.0 5.0 - 8.0   Glucose, UA NEGATIVE NEGATIVE mg/dL   Hgb urine dipstick NEGATIVE NEGATIVE   Bilirubin Urine SMALL (A) NEGATIVE   Ketones, ur NEGATIVE NEGATIVE mg/dL   Protein, ur NEGATIVE NEGATIVE mg/dL   Nitrite NEGATIVE NEGATIVE   Leukocytes,Ua NEGATIVE NEGATIVE    Comment: Performed at [facility] [address] [place], [place] [address]  Potassium     Status: Abnormal   Collection Time: 03/28/19 10:52 AM  Result Value Ref Range   Potassium 3.4 (L) 3.5 - 5.1 mmol/L    Comment: Performed at [facility] [address], [place], [place] [address]  SARS Coronavirus 2 [facility] order, Performed in [facility] hospital lab)     Status: None   Collection Time: 03/28/19 11:26 AM  Result Value Ref Range   SARS Coronavirus 2 NEGATIVE NEGATIVE    Comment: (NOTE) If result is NEGATIVE SARS-CoV-2 target nucleic acids are NOT DETECTED. The SARS-CoV-2 RNA is generally detectable in upper and lower  respiratory specimens during the acute phase of infection. The lowest  concentration of SARS-CoV-2 viral copies this assay can detect is 250  copies / mL. A negative result does not preclude SARS-CoV-2 infection  and should not be used as the sole basis for treatment or other  patient management decisions.  A negative result may occur with  improper specimen collection / handling, submission of specimen other  than nasopharyngeal swab, presence of viral mutation(s) within the  areas targeted by this assay, and inadequate number of viral copies  (<250 copies / mL). A negative result must be combined with clinical  observations, patient history, and epidemiological information. If result is POSITIVE SARS-CoV-2 target nucleic acids are DETECTED. The SARS-CoV-2 RNA is generally detectable in upper and lower  respiratory specimens dur ing the acute phase of infection.  Positive  results are indicative of active infection with SARS-CoV-2.   Clinical  correlation with patient history and other diagnostic information is  necessary to determine patient infection status.  Positive results do  not rule out bacterial infection or co-infection with other viruses. If result is PRESUMPTIVE POSTIVE SARS-CoV-2 nucleic acids MAY BE PRESENT.   A presumptive positive result was obtained on the submitted specimen  and confirmed on repeat testing.  While 2019 novel coronavirus  (SARS-CoV-2) nucleic acids may be present in the submitted sample  additional confirmatory testing may be necessary for epidemiological  and / or clinical management purposes  to differentiate between  SARS-CoV-2 and other Sarbecovirus currently known to infect humans.  If clinically indicated additional testing with an alternate test  methodology [phone]) is advised. The SARS-CoV-2 RNA is generally  detectable in upper and lower respiratory sp ecimens during the acute  phase of infection. The expected result is Negative. Fact Sheet for Patients:  StrictlyIdeas.no Fact Sheet for Healthcare Providers: BankingDealers.co.za This test is not yet approved or cleared by the [place] FDA and has been authorized for detection and/or diagnosis of SARS-CoV-2 by FDA under an Emergency Use Authorization (EUA).  This EUA will remain in effect (meaning this test can be used) for the duration of the COVID-19 declaration under Section 564(b)(1) of the Act, 21 U.S.C. section 360bbb-3(b)(1), unless the authorization is terminated or revoked sooner. Performed at [facility], [address] [place], [place] [address]   Protime-INR     Status: None   Collection Time: 03/28/19  2:30 PM  Result Value Ref Range   Prothrombin Time 14.0 11.4 - 15.2 seconds   INR 1.1 0.8 - 1.2    Comment: (NOTE) INR goal varies based on device and disease states. Performed at [facility], [place] [address] [place],  [address]     Ct Abdomen Pelvis W Contrast  Result Date: 03/28/2019 CLINICAL DATA:  Jaundice. EXAM: CT ABDOMEN AND PELVIS WITH CONTRAST TECHNIQUE: Multidetector CT imaging of the abdomen and pelvis was performed using the standard protocol following bolus administration of intravenous contrast. CONTRAST:  156mL OMNIPAQUE IOHEXOL 300 MG/ML  SOLN COMPARISON:  CT scan of July 31, 2018. FINDINGS: Lower chest: No acute abnormality. Hepatobiliary: There is interval development of severe intrahepatic and extrahepatic biliary dilatation it appears to be due to possible pancreatic neoplasm. Gallbladder distention is noted without cholelithiasis. No gallbladder wall thickening or pericholecystic fluid is noted. No definite hepatic mass is noted. Pancreas: 20 x 14 mm ill-defined low density is noted in pancreatic head concerning for possible neoplasm. Mild ductal dilatation is noted. No evidence of pancreatic inflammation is noted. Spleen: Normal in size without focal abnormality. Adrenals/Urinary Tract: Adrenal glands are unremarkable. Kidneys are normal, without renal calculi, focal lesion, or hydronephrosis. Bladder is unremarkable. Stomach/Bowel: Stomach is within normal limits. Appendix appears normal. No evidence of bowel wall thickening, distention, or inflammatory changes. Sigmoid diverticulosis is noted without inflammation. Vascular/Lymphatic: No significant vascular findings are present. No enlarged abdominal or pelvic lymph nodes. Reproductive: Status post hysterectomy. No adnexal masses. Other: No abdominal wall hernia or abnormality. No abdominopelvic ascites. Musculoskeletal: No acute or significant osseous findings. IMPRESSION: Interval development of 20 x 14 mm ill-defined low density in pancreatic head concerning for possible neoplasm or malignancy. This appears to be resulting in severe intrahepatic and extrahepatic biliary dilatation. ERCP is recommended for further evaluation. Sigmoid diverticulosis without  inflammation. Electronically Signed   By: Marijo Conception M.D.   On: 03/28/2019 09:40    Pending Labs Unresulted Labs (From admission, onward)    Start     Ordered   03/29/19 0500  Comprehensive metabolic panel  Tomorrow morning,   R     03/28/19 1726   03/29/19 0500  Magnesium  Tomorrow morning,   R     03/28/19 1726   03/29/19 0500  CBC WITH DIFFERENTIAL  Tomorrow morning,   R     03/28/19 1726   03/29/19 0500  Protime-INR  Tomorrow morning,   R     03/28/19 1726   03/28/19 0086  Basic metabolic panel  Once,   R     03/28/19 1637   03/28/19 1208  C difficile quick scan w PCR reflex  (C Difficile quick screen w PCR reflex panel)  Once, for 24 hours,   R     03/28/19 1207          Vitals/Pain Today's Vitals   03/28/19 1600 03/28/19 1630 03/28/19 1701 03/28/19 1730  BP: (!) 84/37 109/78 (!) 144/67 (!) 145/99  Pulse: 88 90 100 92  Resp: 17 15 20 17   Temp:      TempSrc:      SpO2: 98% 97% 97% 98%  Weight:      Height:      PainSc:        Isolation Precautions Droplet and Contact precautions  Medications Medications  0.9 %  sodium chloride infusion (has no administration in time range)  ondansetron (ZOFRAN) tablet 4 mg (has no administration in time range)    Or  ondansetron (ZOFRAN) injection 4 mg (has no administration in time range)  ondansetron (ZOFRAN) injection 4 mg (4 mg Intravenous Given 03/28/19 0813)  sodium chloride 0.9 % bolus 1,000 mL (0 mLs Intravenous Stopped 03/28/19 1125)  iohexol (OMNIPAQUE) 300 MG/ML solution 100 mL (100 mLs Intravenous Contrast Given 03/28/19 0906)  potassium chloride (K-DUR) CR tablet 30 mEq (30 mEq Oral Given 03/28/19 1425)    Mobility walks with person assist Moderate fall risk   Focused Assessments    R Recommendations: See Admitting Provider Note  Report given to:   Additional Notes:

## 2019-03-28 NOTE — ED Notes (Signed)
Pt aware that a stool sample is needed, will provide one when able.

## 2019-03-28 NOTE — ED Provider Notes (Signed)
  Physical Exam  BP 139/63   Pulse 86   Temp 98 F (36.7 C) (Oral)   Resp (!) 24   Ht 5\' 4"  (1.626 m)   Wt 63.5 kg   SpO2 99%   BMI 24.03 kg/m   Physical Exam  ED Course/Procedures   Clinical Course as of Mar 27 1825  [facility] Mar 28, 2019  0837 Hemoglobin appears stable at 11.8 compared to previous values.  Hemoglobin(!): 11.8 [AH]  0837 WBCs are within normal limits.  WBC: 6.3 [AH]  0921 CMP reveals total bilirubin elevated at 23.9.  Hyponatremia at 132. Hyperkalemia at 5.8. Hyperglycemia at 119. Low BUN at 6. Low creatinine at 0.40. Elevated alkaline phosphate at 1,141. Elevated LFTs.  Total Bilirubin(!!): 23.9 [AH]  0945 CT reveals interval development of 20 x 14 mm ill-defined low density in pancreatic head concerning for possible neoplasm or malignancy. This appears to be resulting in severe intrahepatic and extrahepatic biliary dilatation. ERCP is recommended for further evaluation. Sigmoid diverticulosis without inflammation.    CT Abdomen Pelvis W Contrast [AH]  1388 Pending GI consult for ?pancreatic cancer. Possible need for ERCP.    [KM]  1152 Repeat potassium is 3.4.  Potassium(!): 3.4 [AH]  1308 Still pending GI consult. Patient stable.    [KM]    Clinical Course User Index [AH] Arville Lime, PA-C [KM] Alveria Apley, PA-C    Procedures  MDM   Patient here for generalized weakness. Workup revealing concern for ? Pancreatic cancer. ? Need for ERCP. GI consulted. Patient currently stable and waiting on consult.      Alveria Apley, PA-C 03/28/19 1826    Tegeler, Gwenyth Allegra, MD 03/29/19 [phone]

## 2019-03-28 NOTE — Progress Notes (Signed)
1821: Patient arrived to room 5W10. Room set up. Patient noted to be jaundice, no signs of distress. Call light in reach, bed in low, locked position.  1828: Admission compelte

## 2019-03-28 NOTE — ED Provider Notes (Addendum)
[facility] EMERGENCY DEPARTMENT Provider Note   CSN: [id number] Arrival date & time: 03/28/19  3500    History   Chief Complaint Chief Complaint  Patient presents with   Weakness    HPI Julie Rollins is a 73 y.o. female with a PMH of HTN, breast cancer in 2000, anxiety, hypothyroidism, and colitis presenting with generalized weakness onset 1 month ago. Patient arrived via EMS from home where she lives alone. Patient reports she has had associated nausea and diarrhea for 1 month. Patient reports jaundice for 1 week. Patient reports vomiting a few weeks ago, but denies any recent vomiting. Patient denies blood in stool or emesis. Patient reports stool is light in color and urine is dark yellow. Patient reports urinary frequency, but denies dysuria or hematuria. Patient reports intermittent dull RUQ abdominal pain, but denies any pain currently. Patient reports she has had a hernia repair and a hysterectomy in the past. Patient denies fever, chills, chest pain, shortness of breath, or cough. Patient denies vision changes, numbness, or paresthesias. Patient reports she last ate corn bread last night. Patient denies alcohol, tobacco, or drug use.     HPI  Past Medical History:  Diagnosis Date   Anxiety    Blood transfusion without reported diagnosis    Breast CA ([place])    s/p lumpectomy and radiation 2000   Colitis    2007   HTN (hypertension)    "mild hypertension", was on BP med years ago but it caused orthostatic hypotension and she has not been medicated since   Hypothyroid    Personal history of radiation therapy 2001   Vertigo    Vertigo     Patient Active Problem List   Diagnosis Date Noted   Cervical spine fracture ([place] 07/31/2018    Past Surgical History:  Procedure Laterality Date   ABDOMINAL HYSTERECTOMY     2yrs ago   ANTERIOR CERVICAL DECOMP/DISCECTOMY FUSION N/A 08/02/2018   Procedure: ANTERIOR CERVICAL DECOMPRESSION/DISCECTOMY  FUSION CERVICAL FIVE- CERVICAL SIX;  Surgeon: Earnie Larsson, MD;  Location: [facility];  Service: Neurosurgery;  Laterality: N/A;  ANTERIOR CERVICAL DECOMPRESSION/DISCECTOMY FUSION CERVICAL FIVE- CERVICAL SIX   BREAST LUMPECTOMY     2000   HERNIA REPAIR     20+ years ago   RETINAL DETACHMENT SURGERY       OB History   No obstetric history on file.      Home Medications    Prior to Admission medications   Medication Sig Start Date End Date Taking? Authorizing Provider  acetaminophen (TYLENOL) 325 MG tablet Take 2 tablets (650 mg total) by mouth every 8 (eight) hours. 08/05/18   Meuth, Brooke A, PA-C  Aspirin-Acetaminophen-Caffeine (GOODY HEADACHE PO) Take 1 Package by mouth daily as needed (headache).    [provider]  citalopram (CELEXA) 20 MG tablet Take 20 mg by mouth at bedtime.    [provider]  diltiazem (CARDIZEM CD) 180 MG 24 hr capsule Take 180 mg by mouth daily. 05/18/18   [provider]  docusate sodium (COLACE) 100 MG capsule Take 1 capsule (100 mg total) by mouth 2 (two) times daily. 08/05/18   Meuth, Brooke A, PA-C  DUREZOL 0.05 % EMUL Place 1 drop into both eyes 2 (two) times daily. 07/14/18   [provider]  famotidine (PEPCID) 20 MG tablet Take 1 tablet (20 mg total) by mouth 2 (two) times daily. 08/05/18   Meuth, Brooke A, PA-C  gabapentin (NEURONTIN) 300 MG capsule Take  1 capsule (300 mg total) by mouth 3 (three) times daily. 08/05/18   Meuth, Brooke A, PA-C  ibuprofen (ADVIL,MOTRIN) 800 MG tablet Take 1 tablet (800 mg total) by mouth 3 (three) times daily. 08/05/18   Meuth, Brooke A, PA-C  levothyroxine (SYNTHROID, LEVOTHROID) 50 MCG tablet Take 50 mcg by mouth every morning.    [provider]  methocarbamol (ROBAXIN) 500 MG tablet Take 1 tablet (500 mg total) by mouth every 8 (eight) hours as needed for muscle spasms. 08/05/18   Meuth, Brooke A, PA-C  ofloxacin (OCUFLOX) 0.3 % ophthalmic solution Place 1 drop into both eyes 2  (two) times daily. 07/14/18   [provider]  polyethylene glycol (MIRALAX / GLYCOLAX) packet Take 17 g by mouth daily. 08/06/18   Meuth, Blaine Hamper, PA-C    Family History Family History  Problem Relation Age of Onset   Bladder Cancer Brother    Other Other        Denies family h/o cardiac disease    Social History Social History   Tobacco Use   Smoking status: Never Smoker   Smokeless tobacco: Never Used  Substance Use Topics   Alcohol use: No   Drug use: No     Allergies   Codeine   Review of Systems Review of Systems  Constitutional: Negative for activity change, appetite change, chills and fever.  HENT: Negative for congestion, rhinorrhea and sore throat.   Eyes: Negative for visual disturbance.  Respiratory: Negative for cough and shortness of breath.   Cardiovascular: Negative for chest pain.  Gastrointestinal: Positive for abdominal pain, diarrhea, nausea and vomiting. Negative for constipation.  Endocrine: Negative for polydipsia, polyphagia and polyuria.  Genitourinary: Positive for frequency. Negative for dysuria and flank pain.  Musculoskeletal: Negative for back pain.  Skin: Positive for color change. Negative for rash.  Allergic/Immunologic: Negative for immunocompromised state.  Neurological: Positive for weakness. Negative for dizziness, syncope, speech difficulty, numbness and headaches.  Psychiatric/Behavioral: The patient is not nervous/anxious.     Physical Exam Updated Vital Signs BP (!) 170/159    Pulse 92    Temp 98 F (36.7 C) (Oral)    Resp 19    Ht 5\' 4"  (1.626 m)    Wt 63.5 kg    SpO2 100%    BMI 24.03 kg/m   Physical Exam Vitals signs and nursing note reviewed.  Constitutional:      General: She is not in acute distress.    Appearance: She is well-developed. She is not diaphoretic.  HENT:     Head: Normocephalic and atraumatic.     Nose: Nose normal.     Mouth/Throat:     Mouth: Mucous membranes are moist.      Pharynx: No posterior oropharyngeal erythema.  Eyes:     General: Scleral icterus present.     Extraocular Movements: Extraocular movements intact.     Pupils: Pupils are equal, round, and reactive to light.  Neck:     Musculoskeletal: Normal range of motion.  Cardiovascular:     Rate and Rhythm: Normal rate and regular rhythm.     Heart sounds: Normal heart sounds. No murmur. No friction rub. No gallop.   Pulmonary:     Effort: Pulmonary effort is normal. No respiratory distress.     Breath sounds: Normal breath sounds. No wheezing or rales.  Abdominal:     Palpations: Abdomen is soft.     Tenderness: There is no abdominal tenderness.     Comments:  No abdominal tenderness on exam.  Musculoskeletal: Normal range of motion.  Skin:    General: Skin is warm.     Coloration: Skin is jaundiced.     Findings: No erythema or rash.  Neurological:     Mental Status: She is alert.    Mental Status:  Alert, oriented, thought content appropriate, able to give a coherent history. Speech fluent without evidence of aphasia. Able to follow 2 step commands without difficulty.  Cranial Nerves:  II:  Peripheral visual fields grossly normal, pupils equal, round, reactive to light III,IV, VI: ptosis not present, extra-ocular motions intact bilaterally  V,VII: smile symmetric, facial light touch sensation equal VIII: hearing grossly normal to voice  IX,X: symmetric elevation of soft palate, uvula elevates symmetrically  XI: bilateral shoulder shrug symmetric and strong XII: midline tongue extension without fassiculations Motor:  Normal tone. 5/5 in upper and lower extremities bilaterally including strong and equal grip strength and dorsiflexion/plantar flexion Sensory: Pinprick and light touch normal in all extremities.  Deep Tendon Reflexes: 2+ and symmetric in the biceps and patella Cerebellar: normal finger-to-nose with bilateral upper extremities Gait: normal gait and balance.  CV: distal  pulses palpable throughout    ED Treatments / Results  Labs (all labs ordered are listed, but only abnormal results are displayed) Labs Reviewed  COMPREHENSIVE METABOLIC PANEL - Abnormal; Notable for the following components:      Result Value   Sodium 132 (*)    Potassium 5.8 (*)    Glucose, Bld 119 (*)    BUN 6 (*)    Creatinine, Ser 0.40 (*)    Total Protein 6.4 (*)    Albumin 2.9 (*)    AST 318 (*)    ALT 346 (*)    Alkaline Phosphatase 1,141 (*)    Total Bilirubin 23.9 (*)    All other components within normal limits  CBC WITH DIFFERENTIAL/PLATELET - Abnormal; Notable for the following components:   Hemoglobin 11.8 (*)    HCT 34.8 (*)    RDW 24.4 (*)    All other components within normal limits  URINALYSIS, ROUTINE W REFLEX MICROSCOPIC - Abnormal; Notable for the following components:   Color, Urine AMBER (*)    Bilirubin Urine SMALL (*)    All other components within normal limits  LIPASE, BLOOD - Abnormal; Notable for the following components:   Lipase 102 (*)    All other components within normal limits  TROPONIN I - Abnormal; Notable for the following components:   Troponin I 0.03 (*)    All other components within normal limits  POTASSIUM - Abnormal; Notable for the following components:   Potassium 3.4 (*)    All other components within normal limits  I-STAT CREATININE, ED - Abnormal; Notable for the following components:   Creatinine, Ser 0.40 (*)    All other components within normal limits  SARS CORONAVIRUS 2 (HOSPITAL ORDER, [facility])  C DIFFICILE QUICK SCREEN W PCR REFLEX  PROTIME-INR    EKG EKG Interpretation  Date/Time:  Monday Mar 28 2019 07:42:53 EDT Ventricular Rate:  78 PR Interval:    QRS Duration: 129 QT Interval:  421 QTC Calculation: 480 R Axis:   124 Text Interpretation:  Sinus rhythm Consider left ventricular hypertrophy Abnormal T, consider ischemia, diffuse leads Borderline ST elevation, lateral  leads No significant change since last tracing Confirmed by Deno Etienne [phone]) on 03/28/2019 9:36:06 AM   Radiology Ct Abdomen Pelvis W Contrast  Result  Date: 03/28/2019 CLINICAL DATA:  Jaundice. EXAM: CT ABDOMEN AND PELVIS WITH CONTRAST TECHNIQUE: Multidetector CT imaging of the abdomen and pelvis was performed using the standard protocol following bolus administration of intravenous contrast. CONTRAST:  113mL OMNIPAQUE IOHEXOL 300 MG/ML  SOLN COMPARISON:  CT scan of July 31, 2018. FINDINGS: Lower chest: No acute abnormality. Hepatobiliary: There is interval development of severe intrahepatic and extrahepatic biliary dilatation it appears to be due to possible pancreatic neoplasm. Gallbladder distention is noted without cholelithiasis. No gallbladder wall thickening or pericholecystic fluid is noted. No definite hepatic mass is noted. Pancreas: 20 x 14 mm ill-defined low density is noted in pancreatic head concerning for possible neoplasm. Mild ductal dilatation is noted. No evidence of pancreatic inflammation is noted. Spleen: Normal in size without focal abnormality. Adrenals/Urinary Tract: Adrenal glands are unremarkable. Kidneys are normal, without renal calculi, focal lesion, or hydronephrosis. Bladder is unremarkable. Stomach/Bowel: Stomach is within normal limits. Appendix appears normal. No evidence of bowel wall thickening, distention, or inflammatory changes. Sigmoid diverticulosis is noted without inflammation. Vascular/Lymphatic: No significant vascular findings are present. No enlarged abdominal or pelvic lymph nodes. Reproductive: Status post hysterectomy. No adnexal masses. Other: No abdominal wall hernia or abnormality. No abdominopelvic ascites. Musculoskeletal: No acute or significant osseous findings. IMPRESSION: Interval development of 20 x 14 mm ill-defined low density in pancreatic head concerning for possible neoplasm or malignancy. This appears to be resulting in severe intrahepatic  and extrahepatic biliary dilatation. ERCP is recommended for further evaluation. Sigmoid diverticulosis without inflammation. Electronically Signed   By: Marijo Conception M.D.   On: 03/28/2019 09:40    Procedures Procedures (including critical care time)  Medications Ordered in ED Medications  ondansetron (ZOFRAN) injection 4 mg (4 mg Intravenous Given 03/28/19 0813)  sodium chloride 0.9 % bolus 1,000 mL (0 mLs Intravenous Stopped 03/28/19 1125)  iohexol (OMNIPAQUE) 300 MG/ML solution 100 mL (100 mLs Intravenous Contrast Given 03/28/19 0906)  potassium chloride (K-DUR) CR tablet 30 mEq (30 mEq Oral Given 03/28/19 1425)     Initial Impression / Assessment and Plan / ED Course  I have reviewed the triage vital signs and the nursing notes.  Pertinent labs & imaging results that were available during my care of the patient were reviewed by me and considered in my medical decision making (see chart for details).  Clinical Course as of Mar 27 1612  Mon Mar 28, 2019  2774 Hemoglobin appears stable at 11.8 compared to previous values.  Hemoglobin(!): 11.8 [AH]  0837 WBCs are within normal limits.  WBC: 6.3 [AH]  0921 CMP reveals total bilirubin elevated at 23.9.  Hyponatremia at 132. Hyperkalemia at 5.8. Hyperglycemia at 119. Low BUN at 6. Low creatinine at 0.40. Elevated alkaline phosphate at 1,141. Elevated LFTs.  Total Bilirubin(!!): 23.9 [AH]  0945 CT reveals interval development of 20 x 14 mm ill-defined low density in pancreatic head concerning for possible neoplasm or malignancy. This appears to be resulting in severe intrahepatic and extrahepatic biliary dilatation. ERCP is recommended for further evaluation. Sigmoid diverticulosis without inflammation.    CT Abdomen Pelvis W Contrast [AH]  1287 Pending GI consult for ?pancreatic cancer. Possible need for ERCP.    [KM]  1152 Repeat potassium is 3.4.  Potassium(!): 3.4 [AH]  1308 Still pending GI consult. Patient stable.    [KM]      Clinical Course User Index [AH] Arville Lime, PA-C [KM] Alveria Apley, PA-C      Patient presents with generalized weakness, diarrhea,  nausea, and jaundice. Bilirubin elevated at 23.9. CT reveals a 20 x 14 mm ill-defined low density of pancreatic head and severe intrahepatic and extrahepatic biliary dilation. ERCP recommended by radiologist. GI was consulted. GI states they will evaluate patient. GI recommendations pending. Troponin elevated at 0.03. No changes on EKG. Suspect patient will need admission for further evaluation of possible pancreatic malignancy and elevated troponin.   Care was transferred to [facility], PA-C who will follow pending studies, re-evaluate and determine disposition.    Findings and plan of care discussed with supervising physician Dr. Tyrone Nine who personally evaluated and examined this patient.  Final Clinical Impressions(s) / ED Diagnoses   Final diagnoses:  Generalized weakness  Jaundice    ED Discharge Orders    None       Arville Lime, PA-C 03/28/19 [place], [place], [place] 03/28/19 [place], [place], DO 03/29/19 [phone]

## 2019-03-28 NOTE — H&P (Signed)
.  History and Physical    Julie Rollins UDJ:[id number] DOB: 07/05/1946 DOA: 03/28/2019  PCP: Nolene Ebbs, MD  Patient coming from: Home   Chief Complaint: "I'm yellow!"  HPI: Julie Rollins is a 73 y.o. female with medical history significant of breast cancer, hypothyroidism, HTN, and anxiety. She present with diarrhea and jaundice. She reports that she noticed about 1 month ago that her urine was very dark, but she had no burning, pain, or fevers. She did notice some increased frequency. She became concerned and talked to her PCP. Her w/u was negative. She then started having diarrhea shortly after. She didn't have any associated fever, but did have an episode of vomiting -- however, none in the last 2 weeks. She again, spoke with her PCP and her w/u was negative. She then noticed over the last week she has become jaundiced. She decided she needed to come to the ED and be checked out.    ED Course: ED w/u reveals a mass in the head of the pancrease and dilation of CBD/intrahepatic ducts w/ associated elevated LFTs.  Review of Systems: She denies CP, HA, palpitations, abdominal pain, hematuria, hematemesis. Remainder of 10 point review of systems is otherwise negative for all not mentioned in HPI.    Past Medical History:  Diagnosis Date   Anxiety    Blood transfusion without reported diagnosis    Breast CA ([place])    s/p lumpectomy and radiation 2000   Colitis    2007   HTN (hypertension)    "mild hypertension", was on BP med years ago but it caused orthostatic hypotension and she has not been medicated since   Hypothyroid    Personal history of radiation therapy 2001   Vertigo    Vertigo     Past Surgical History:  Procedure Laterality Date   ABDOMINAL HYSTERECTOMY     19yrs ago   ANTERIOR CERVICAL DECOMP/DISCECTOMY FUSION N/A 08/02/2018   Procedure: ANTERIOR CERVICAL DECOMPRESSION/DISCECTOMY FUSION CERVICAL FIVE- CERVICAL SIX;  Surgeon: Earnie Larsson, MD;  Location:  [facility];  Service: Neurosurgery;  Laterality: N/A;  ANTERIOR CERVICAL DECOMPRESSION/DISCECTOMY FUSION CERVICAL FIVE- CERVICAL SIX   BREAST LUMPECTOMY     2000   HERNIA REPAIR     20+ years ago   RETINAL DETACHMENT SURGERY       reports that she has never smoked. She has never used smokeless tobacco. She reports that she does not drink alcohol or use drugs.  Allergies  Allergen Reactions   Codeine Nausea Only    Family History  Problem Relation Age of Onset   Bladder Cancer Brother    Other Other        Denies family h/o cardiac disease    Prior to Admission medications   Medication Sig Start Date End Date Taking? Authorizing Provider  acetaminophen (TYLENOL) 325 MG tablet Take 2 tablets (650 mg total) by mouth every 8 (eight) hours. 08/05/18   Meuth, Brooke A, PA-C  Aspirin-Acetaminophen-Caffeine (GOODY HEADACHE PO) Take 1 Package by mouth daily as needed (headache).    [provider]  citalopram (CELEXA) 20 MG tablet Take 20 mg by mouth at bedtime.    [provider]  diltiazem (CARDIZEM CD) 180 MG 24 hr capsule Take 180 mg by mouth daily. 05/18/18   [provider]  docusate sodium (COLACE) 100 MG capsule Take 1 capsule (100 mg total) by mouth 2 (two) times daily. 08/05/18   Meuth, Brooke A, PA-C  DUREZOL 0.05 % EMUL  Place 1 drop into both eyes 2 (two) times daily. 07/14/18   [provider]  famotidine (PEPCID) 20 MG tablet Take 1 tablet (20 mg total) by mouth 2 (two) times daily. 08/05/18   Meuth, Brooke A, PA-C  gabapentin (NEURONTIN) 300 MG capsule Take 1 capsule (300 mg total) by mouth 3 (three) times daily. 08/05/18   Meuth, Brooke A, PA-C  ibuprofen (ADVIL,MOTRIN) 800 MG tablet Take 1 tablet (800 mg total) by mouth 3 (three) times daily. 08/05/18   Meuth, Brooke A, PA-C  levothyroxine (SYNTHROID, LEVOTHROID) 50 MCG tablet Take 50 mcg by mouth every morning.    [provider]  methocarbamol (ROBAXIN) 500 MG tablet Take 1 tablet  (500 mg total) by mouth every 8 (eight) hours as needed for muscle spasms. 08/05/18   Meuth, Brooke A, PA-C  ofloxacin (OCUFLOX) 0.3 % ophthalmic solution Place 1 drop into both eyes 2 (two) times daily. 07/14/18   [provider]  polyethylene glycol (MIRALAX / GLYCOLAX) packet Take 17 g by mouth daily. 08/06/18   Wellington Hampshire, PA-C    Physical Exam: Vitals:   03/28/19 1530 03/28/19 1600 03/28/19 1630 03/28/19 1701  BP: (!) 141/59 (!) 84/37 109/78 (!) 144/67  Pulse: 83 88 90 100  Resp: 16 17 15 20   Temp:      TempSrc:      SpO2: 97% 98% 97% 97%  Weight:      Height:        Constitutional: 73 y.o. female NAD, calm, comfortable Vitals:   03/28/19 1530 03/28/19 1600 03/28/19 1630 03/28/19 1701  BP: (!) 141/59 (!) 84/37 109/78 (!) 144/67  Pulse: 83 88 90 100  Resp: 16 17 15 20   Temp:      TempSrc:      SpO2: 97% 98% 97% 97%  Weight:      Height:       Eyes: PERRL, lids and conjunctivae normal ENMT: Mucous membranes are moist. Posterior pharynx clear of any exudate or lesions.Normal dentition.  Neck: normal, supple, no masses, no thyromegaly Respiratory: clear to auscultation bilaterally, no wheezing, no crackles. Normal respiratory effort. No accessory muscle use.  Cardiovascular: Regular rate and rhythm, no murmurs / rubs / gallops.  2+ pedal pulses. No carotid bruits.  Abdomen: BS+, soft, slightly distended, NT, no masses noted Musculoskeletal: no clubbing / cyanosis. No joint deformity upper and lower extremities. Good ROM, no contractures. Normal muscle tone.  Skin: no rashes, lesions, ulcers. She is jaundiced  Neurologic: CN 2-12 grossly intact. Sensation intact, DTR normal. Strength 5/5 in all 4.  Psychiatric: Normal judgment and insight. Alert and oriented x 3. Normal mood.    Labs on Admission: I have personally reviewed following labs and imaging studies  CBC: Recent Labs  Lab 03/28/19 0800  WBC 6.3  NEUTROABS 5.0  HGB 11.8*  HCT 34.8*  MCV 87.7    PLT 188   Basic Metabolic Panel: Recent Labs  Lab 03/28/19 0800 03/28/19 0818 03/28/19 1052  NA 132*  --   --   K 5.8*  --  3.4*  CL 100  --   --   CO2 23  --   --   GLUCOSE 119*  --   --   BUN 6*  --   --   CREATININE 0.40* 0.40*  --   CALCIUM 9.2  --   --    GFR: Estimated Creatinine Clearance: 53.3 mL/min (A) (by C-G formula based on SCr of 0.4 mg/dL (L)).  Liver Function Tests: Recent Labs  Lab 03/28/19 0800  AST 318*  ALT 346*  ALKPHOS 1,141*  BILITOT 23.9*  PROT 6.4*  ALBUMIN 2.9*   Recent Labs  Lab 03/28/19 0800  LIPASE 102*   No results for input(s): AMMONIA in the last 168 hours. Coagulation Profile: Recent Labs  Lab 03/28/19 1430  INR 1.1   Cardiac Enzymes: Recent Labs  Lab 03/28/19 0800  TROPONINI 0.03*   BNP (last 3 results) No results for input(s): PROBNP in the last 8760 hours. HbA1C: No results for input(s): HGBA1C in the last 72 hours. CBG: No results for input(s): GLUCAP in the last 168 hours. Lipid Profile: No results for input(s): CHOL, HDL, LDLCALC, TRIG, CHOLHDL, LDLDIRECT in the last 72 hours. Thyroid Function Tests: No results for input(s): TSH, T4TOTAL, FREET4, T3FREE, THYROIDAB in the last 72 hours. Anemia Panel: No results for input(s): VITAMINB12, FOLATE, FERRITIN, TIBC, IRON, RETICCTPCT in the last 72 hours. Urine analysis:    Component Value Date/Time   COLORURINE AMBER (A) 03/28/2019 0939   APPEARANCEUR CLEAR 03/28/2019 0939   LABSPEC 1.019 03/28/2019 0939   PHURINE 6.0 03/28/2019 0939   GLUCOSEU NEGATIVE 03/28/2019 0939   HGBUR NEGATIVE 03/28/2019 0939   BILIRUBINUR SMALL (A) 03/28/2019 0939   KETONESUR NEGATIVE 03/28/2019 0939   PROTEINUR NEGATIVE 03/28/2019 0939   UROBILINOGEN 0.2 06/16/2014 0529   NITRITE NEGATIVE 03/28/2019 0939   LEUKOCYTESUR NEGATIVE 03/28/2019 0939    Radiological Exams on Admission: Ct Abdomen Pelvis W Contrast  Result Date: 03/28/2019 CLINICAL DATA:  Jaundice. EXAM: CT ABDOMEN AND  PELVIS WITH CONTRAST TECHNIQUE: Multidetector CT imaging of the abdomen and pelvis was performed using the standard protocol following bolus administration of intravenous contrast. CONTRAST:  174mL OMNIPAQUE IOHEXOL 300 MG/ML  SOLN COMPARISON:  CT scan of July 31, 2018. FINDINGS: Lower chest: No acute abnormality. Hepatobiliary: There is interval development of severe intrahepatic and extrahepatic biliary dilatation it appears to be due to possible pancreatic neoplasm. Gallbladder distention is noted without cholelithiasis. No gallbladder wall thickening or pericholecystic fluid is noted. No definite hepatic mass is noted. Pancreas: 20 x 14 mm ill-defined low density is noted in pancreatic head concerning for possible neoplasm. Mild ductal dilatation is noted. No evidence of pancreatic inflammation is noted. Spleen: Normal in size without focal abnormality. Adrenals/Urinary Tract: Adrenal glands are unremarkable. Kidneys are normal, without renal calculi, focal lesion, or hydronephrosis. Bladder is unremarkable. Stomach/Bowel: Stomach is within normal limits. Appendix appears normal. No evidence of bowel wall thickening, distention, or inflammatory changes. Sigmoid diverticulosis is noted without inflammation. Vascular/Lymphatic: No significant vascular findings are present. No enlarged abdominal or pelvic lymph nodes. Reproductive: Status post hysterectomy. No adnexal masses. Other: No abdominal wall hernia or abnormality. No abdominopelvic ascites. Musculoskeletal: No acute or significant osseous findings. IMPRESSION: Interval development of 20 x 14 mm ill-defined low density in pancreatic head concerning for possible neoplasm or malignancy. This appears to be resulting in severe intrahepatic and extrahepatic biliary dilatation. ERCP is recommended for further evaluation. Sigmoid diverticulosis without inflammation. Electronically Signed   By: Marijo Conception M.D.   On: 03/28/2019 09:40    EKG:  Independently reviewed.  Assessment/Plan Active Problems:   Jaundice   Pancreatic mass   Elevated LFTs   Hypothyroidism   Diarrhea  Pancreatic Mass/Elevated LFTs/Jaundice     - admit to inpt med-tele     - GI consulted, ERCP/EUS for tomorrow     - NPO after midnight     - Fluids  Hypothyroidism     - resume home levothyroxine  Diarrhea     - check for c diff; maintain contact precautions until c diff r/o'd   Hyperglycemia     - no DM Hx     - check A1c   DVT prophylaxis: SCDs  Code Status: FULL  Family Communication: None  Disposition Plan: TBD  Consults called: GI  Admission status: Inpt admit to med-tele. I believe she will be here atleast 2MN d/t severity of disease.     Jonnie Finner DO [facility] Pager [phone]  If 7PM-7AM, please contact night-coverage [email or web address] Password [facility]  03/28/2019, 5:26 PM

## 2019-03-28 NOTE — ED Triage Notes (Signed)
Pt arrives via EMS from home with reports of gen weakness. Endorses diarrhea and nausea x1 month. Pt jaundice and reports this started about a week ago. Pt reports dark urine and just finished an abx

## 2019-03-28 NOTE — ED Notes (Signed)
Patient transported to CT 

## 2019-03-29 ENCOUNTER — Inpatient Hospital Stay (HOSPITAL_COMMUNITY): Payer: Medicare HMO | Admitting: Certified Registered Nurse Anesthetist

## 2019-03-29 ENCOUNTER — Inpatient Hospital Stay (HOSPITAL_COMMUNITY): Payer: Medicare HMO

## 2019-03-29 ENCOUNTER — Encounter (HOSPITAL_COMMUNITY): Payer: Self-pay | Admitting: Gastroenterology

## 2019-03-29 ENCOUNTER — Encounter (HOSPITAL_COMMUNITY)
Admission: EM | Disposition: A | Discharge status: Home / Self Care | Payer: Self-pay | Source: Home / Self Care | Attending: Internal Medicine

## 2019-03-29 DIAGNOSIS — E039 Hypothyroidism, unspecified: Secondary | ICD-10-CM

## 2019-03-29 DIAGNOSIS — R945 Abnormal results of liver function studies: Secondary | ICD-10-CM

## 2019-03-29 DIAGNOSIS — C250 Malignant neoplasm of head of pancreas: Principal | ICD-10-CM

## 2019-03-29 DIAGNOSIS — K831 Obstruction of bile duct: Secondary | ICD-10-CM

## 2019-03-29 DIAGNOSIS — R17 Unspecified jaundice: Secondary | ICD-10-CM

## 2019-03-29 DIAGNOSIS — R197 Diarrhea, unspecified: Secondary | ICD-10-CM

## 2019-03-29 HISTORY — PX: FINE NEEDLE ASPIRATION: SHX5430

## 2019-03-29 HISTORY — PX: EUS: SHX5427

## 2019-03-29 HISTORY — PX: ERCP: SHX5425

## 2019-03-29 HISTORY — PX: BILIARY BRUSHING: SHX6843

## 2019-03-29 HISTORY — PX: BILIARY STENT PLACEMENT: SHX5538

## 2019-03-29 HISTORY — PX: SPHINCTEROTOMY: SHX5544

## 2019-03-29 HISTORY — PX: ESOPHAGOGASTRODUODENOSCOPY (EGD) WITH PROPOFOL: SHX5813

## 2019-03-29 HISTORY — PX: BIOPSY: SHX5522

## 2019-03-29 LAB — CBC WITH DIFFERENTIAL/PLATELET
Abs Immature Granulocytes: 0.02 10*3/uL (ref 0.00–0.07)
Basophils Absolute: 0 10*3/uL (ref 0.0–0.1)
Basophils Relative: 0 %
Eosinophils Absolute: 0.1 10*3/uL (ref 0.0–0.5)
Eosinophils Relative: 2 %
HCT: 26 % — ABNORMAL LOW (ref 36.0–46.0)
Hemoglobin: 8.9 g/dL — ABNORMAL LOW (ref 12.0–15.0)
Immature Granulocytes: 0 %
Lymphocytes Relative: 14 %
Lymphs Abs: 0.8 10*3/uL (ref 0.7–4.0)
MCH: 29.8 pg (ref 26.0–34.0)
MCHC: 34.2 g/dL (ref 30.0–36.0)
MCV: 87 fL (ref 80.0–100.0)
Monocytes Absolute: 0.5 10*3/uL (ref 0.1–1.0)
Monocytes Relative: 8 %
Neutro Abs: 4.3 10*3/uL (ref 1.7–7.7)
Neutrophils Relative %: 76 %
Platelets: 256 10*3/uL (ref 150–400)
RBC: 2.99 MIL/uL — ABNORMAL LOW (ref 3.87–5.11)
RDW: 24.4 % — ABNORMAL HIGH (ref 11.5–15.5)
WBC: 5.8 10*3/uL (ref 4.0–10.5)
nRBC: 0 % (ref 0.0–0.2)

## 2019-03-29 LAB — COMPREHENSIVE METABOLIC PANEL
ALT: 259 U/L — ABNORMAL HIGH (ref 0–44)
AST: 215 U/L — ABNORMAL HIGH (ref 15–41)
Albumin: 2.1 g/dL — ABNORMAL LOW (ref 3.5–5.0)
Alkaline Phosphatase: 925 U/L — ABNORMAL HIGH (ref 38–126)
Anion gap: 8 (ref 5–15)
BUN: 6 mg/dL — ABNORMAL LOW (ref 8–23)
CO2: 20 mmol/L — ABNORMAL LOW (ref 22–32)
Calcium: 8 mg/dL — ABNORMAL LOW (ref 8.9–10.3)
Chloride: 110 mmol/L (ref 98–111)
Creatinine, Ser: 0.5 mg/dL (ref 0.44–1.00)
GFR calc Af Amer: >60 mL/min (ref >60)
GFR calc non Af Amer: >60 mL/min (ref >60)
Glucose, Bld: 97 mg/dL (ref 70–99)
Potassium: 3.2 mmol/L — ABNORMAL LOW (ref 3.5–5.1)
Sodium: 138 mmol/L (ref 135–145)
Total Bilirubin: 16.6 mg/dL — ABNORMAL HIGH (ref 0.3–1.2)
Total Protein: 4.9 g/dL — ABNORMAL LOW (ref 6.5–8.1)

## 2019-03-29 LAB — MAGNESIUM: Magnesium: 1.8 mg/dL (ref 1.7–2.4)

## 2019-03-29 LAB — PROTIME-INR
INR: 1.3 — ABNORMAL HIGH (ref 0.8–1.2)
Prothrombin Time: 15.6 seconds — ABNORMAL HIGH (ref 11.4–15.2)

## 2019-03-29 SURGERY — ESOPHAGOGASTRODUODENOSCOPY (EGD) WITH PROPOFOL
Anesthesia: General

## 2019-03-29 MED ORDER — CEFAZOLIN SODIUM-DEXTROSE 2-4 GM/100ML-% IV SOLN
INTRAVENOUS | Status: AC
  Filled 2019-03-29: qty 100

## 2019-03-29 MED ORDER — GLUCAGON HCL RDNA (DIAGNOSTIC) 1 MG IJ SOLR
INTRAMUSCULAR | Status: AC
  Filled 2019-03-29: qty 1

## 2019-03-29 MED ORDER — CEFAZOLIN SODIUM-DEXTROSE 2-4 GM/100ML-% IV SOLN: 2.0000 g | Freq: Once | INTRAVENOUS | Status: DC

## 2019-03-29 MED ORDER — INDOMETHACIN 50 MG RE SUPP
RECTAL | Status: AC
  Filled 2019-03-29: qty 2

## 2019-03-29 MED ORDER — SUCCINYLCHOLINE CHLORIDE 200 MG/10ML IV SOSY
PREFILLED_SYRINGE | INTRAVENOUS | Status: DC | PRN
  Administered 2019-03-29: 60 mg via INTRAVENOUS

## 2019-03-29 MED ORDER — ROCURONIUM BROMIDE 100 MG/10ML IV SOLN
INTRAVENOUS | Status: DC | PRN
  Administered 2019-03-29: 10 mg via INTRAVENOUS
  Administered 2019-03-29: 40 mg via INTRAVENOUS

## 2019-03-29 MED ORDER — PROPOFOL 10 MG/ML IV BOLUS
INTRAVENOUS | Status: DC | PRN
  Administered 2019-03-29: 60 mg via INTRAVENOUS

## 2019-03-29 MED ORDER — PANTOPRAZOLE SODIUM 40 MG PO TBEC
40.0000 mg | DELAYED_RELEASE_TABLET | Freq: Two times a day (BID) | ORAL | Status: DC
  Administered 2019-03-29 – 2019-03-30 (×3): 40 mg via ORAL
  Filled 2019-03-29 (×3): qty 1

## 2019-03-29 MED ORDER — LIDOCAINE HCL (CARDIAC) PF 100 MG/5ML IV SOSY
PREFILLED_SYRINGE | INTRAVENOUS | Status: DC | PRN
  Administered 2019-03-29: 40 mg via INTRAVENOUS

## 2019-03-29 MED ORDER — INDOMETHACIN 50 MG RE SUPP
RECTAL | Status: DC | PRN
  Administered 2019-03-29: 100 mg via RECTAL

## 2019-03-29 MED ORDER — LACTATED RINGERS IV SOLN
INTRAVENOUS | Status: DC
  Administered 2019-03-29: 07:00:00 via INTRAVENOUS

## 2019-03-29 MED ORDER — PHENYLEPHRINE HCL (PRESSORS) 10 MG/ML IV SOLN
INTRAVENOUS | Status: DC | PRN
  Administered 2019-03-29: 80 ug via INTRAVENOUS
  Administered 2019-03-29 (×3): 120 ug via INTRAVENOUS
  Administered 2019-03-29: 80 ug via INTRAVENOUS
  Administered 2019-03-29: 120 ug via INTRAVENOUS
  Administered 2019-03-29 (×2): 80 ug via INTRAVENOUS
  Administered 2019-03-29: 120 ug via INTRAVENOUS

## 2019-03-29 MED ORDER — SUGAMMADEX SODIUM 200 MG/2ML IV SOLN
INTRAVENOUS | Status: DC | PRN
  Administered 2019-03-29: 125 mg via INTRAVENOUS

## 2019-03-29 MED ORDER — INDOMETHACIN 50 MG RE SUPP
100.0000 mg | Freq: Once | RECTAL | Status: AC
  Administered 2019-03-29: 09:00:00 100 mg via RECTAL

## 2019-03-29 MED ORDER — EPHEDRINE SULFATE 50 MG/ML IJ SOLN
INTRAMUSCULAR | Status: DC | PRN
  Administered 2019-03-29: 5 mg via INTRAVENOUS

## 2019-03-29 MED ORDER — FENTANYL CITRATE (PF) 250 MCG/5ML IJ SOLN
INTRAMUSCULAR | Status: DC | PRN
  Administered 2019-03-29: 50 ug via INTRAVENOUS

## 2019-03-29 MED ORDER — POTASSIUM CHLORIDE CRYS ER 20 MEQ PO TBCR
40.0000 meq | EXTENDED_RELEASE_TABLET | ORAL | Status: AC
  Administered 2019-03-29 (×2): 40 meq via ORAL
  Filled 2019-03-29 (×3): qty 2

## 2019-03-29 MED ORDER — ADULT MULTIVITAMIN W/MINERALS CH
1.0000 | ORAL_TABLET | Freq: Every day | ORAL | Status: DC
  Administered 2019-03-29 – 2019-03-30 (×2): 1 via ORAL
  Filled 2019-03-29 (×2): qty 1

## 2019-03-29 MED ORDER — DEXAMETHASONE SODIUM PHOSPHATE 10 MG/ML IJ SOLN
INTRAMUSCULAR | Status: DC | PRN
  Administered 2019-03-29: 10 mg via INTRAVENOUS

## 2019-03-29 MED ORDER — SODIUM CHLORIDE 0.9 % IV SOLN
INTRAVENOUS | Status: DC | PRN
  Administered 2019-03-29: 25 mL

## 2019-03-29 SURGICAL SUPPLY — 15 items

## 2019-03-29 NOTE — Op Note (Signed)
[facility] Patient Name: Julie Rollins Procedure Date : 03/29/2019 MRN: [id number] Attending MD: [name] , MD Date of Birth: Mar 24, 1946 CSN: [id number] Age: 73 Admit Type: Inpatient Procedure:                Upper EUS Indications:              Painless jaundice, CT scan shows mass in pancreas                            with obstructive jaundice Providers:                [name], MD, Vista Lawman, RN, Charolette Child, Technician, Tawni Carnes, CRNA Referring MD:              Medicines:                General Anesthesia Complications:            No immediate complications. Estimated blood loss:                            None. Estimated Blood Loss:     Estimated blood loss: none. Procedure:                Pre-Anesthesia Assessment:                           - Prior to the procedure, a History and Physical                            was performed, and patient medications and                            allergies were reviewed. The patient's tolerance of                            previous anesthesia was also reviewed. The risks                            and benefits of the procedure and the sedation                            options and risks were discussed with the patient.                            All questions were answered, and informed consent                            was obtained. Prior Anticoagulants: The patient has                            taken no previous anticoagulant or antiplatelet  agents. ASA Grade Assessment: III - A patient with                            severe systemic disease. After reviewing the risks                            and benefits, the patient was deemed in                            satisfactory condition to undergo the procedure.                           After obtaining informed consent, the endoscope was                            passed under direct vision.  Throughout the                            procedure, the patient's blood pressure, pulse, and                            oxygen saturations were monitored continuously. The                            [id number]-[id number] ([id number]) Olympus Radial EUS scope was                            introduced through the mouth, and advanced to the                            second part of duodenum. The [id number] ([id number])                            Olympus Linear EUS scope was introduced through the                            and advanced to the. The upper EUS was accomplished                            without difficulty. The patient tolerated the                            procedure well. Scope In: Scope Out: Findings:      ENDOSCOPIC FINDING: :      There was severe, ulcerative, acid related esophagitis (LA Grade D) that       bled with minor scope contact      The entire examined stomach was endoscopically normal.      The major papilla was protuberant and the mucosa just proximal to it was       very irregular for 1-2cm (see images on this and ERCP report). The       mucosa was suspicious for neoplasm (or perhaps edema from underlying       neoplasm).      ENDOSONOGRAPHIC FINDING: :  1. An irregular mass was identified in the pancreatic head. The mass was       hypoechoic and heterogenous. The mass measured 29 mm by 13 mm in maximal       cross-sectional diameter. The endosonographic borders were       poorly-defined. The mass clearly obstructs and dilates the CBD but does       not appear to obstruct or dilate the main pancreatic duct. The mass does       NOT involve any significant nearby vascular structures. Fine needle       aspiration for cytology was performed. Color Doppler imaging was       utilized prior to needle puncture to confirm a lack of significant       vascular structures within the needle path. Two passes were made with       the 25 gauge needle using a transduodenal approach. A  cytotechnologist       was present to evaluate the adequacy of the specimen. Final cytology       results are pending.      2. Obvious low cystic duct takeoff and dilated cystic duct (5-33mm).      3. CBD obstructed as above, dilated to 1.7cm.      4. Pancreatic parenchyma was otherwise normal except as described above.      5. No peripancreatic adenopathy.      6. Dilated gallbladder without stones.      6. Limited views of the liver, spleen, portal and splenic vessels were       all normal. Impression:               - Severe, friable, reflux related esophagitis. She                            should be on BID PPI for now.                           - 2.9cm by 1.3cm mass in the head of pancreas                            causing biliary obstruction but not involving the                            main pancreatic duct or any significant vascular                            structures. The mass is involving the periampullary                            duodenal mucosa (actual invasion or just edema                            related to the underlying mass).                           - No peripancreatic adenopathy.                           - Low cystic duct takeoff; likely obstructed cystic  duct based on dilation of the duct and dilated                            gallbladder. Recommendation:           - ERCP now to decompress the biliary obstruction,                            sample the duodenal mucosa, brush the biliary                            stricture.                           - BID PPI for severe acid related esophagitis. Procedure Code(s):        --- Professional ---                           [phone], Esophagogastroduodenoscopy, flexible,                            transoral; with transendoscopic ultrasound-guided                            intramural or transmural fine needle                            aspiration/biopsy(s), (includes endoscopic                             ultrasound examination limited to the esophagus,                            stomach or duodenum, and adjacent structures) Diagnosis Code(s):        --- Professional ---                           K86.89, Other specified diseases of pancreas                           R93.3, Abnormal findings on diagnostic imaging of                            other parts of digestive tract CPT copyright 2019 American Medical Association. All rights reserved. The codes documented in this report are preliminary and upon coder review may  be revised to meet current compliance requirements. [name], MD 03/29/2019 9:47:22 AM This report has been signed electronically. Number of Addenda: 0

## 2019-03-29 NOTE — Interval H&P Note (Signed)
History and Physical Interval Note:  03/29/2019 6:54 AM  Julie Rollins  has presented today for surgery, with the diagnosis of pancreatic mass.  The various methods of treatment have been discussed with the patient and family. After consideration of risks, benefits and other options for treatment, the patient has consented to  Procedure(s): ESOPHAGOGASTRODUODENOSCOPY (EGD) WITH PROPOFOL (N/A) UPPER ENDOSCOPIC ULTRASOUND (EUS) LINEAR (N/A) ENDOSCOPIC RETROGRADE CHOLANGIOPANCREATOGRAPHY (ERCP) (N/A) as a surgical intervention.  The patient's history has been reviewed, patient examined, no change in status, stable for surgery.  I have reviewed the patient's chart and labs.  Questions were answered to the patient's satisfaction.     [name]

## 2019-03-29 NOTE — Progress Notes (Signed)
Patient off unit via w/c for ERCP and EUS. Patient A&Ox4. No distress noted, no complaints.

## 2019-03-29 NOTE — TOC Initial Note (Addendum)
Transition of Care [facility]) - Initial/Assessment Note    Patient Details  Name: Julie Rollins MRN: [id number] Date of Birth: Aug 23, 1946  [facility] CM/SW Contact:    Sharin Mons, RN Phone Number: 03/29/2019, 12:18 PM  Clinical Narrative:                 Pt form home alone. Presents with diarrhea and jaundice. Hx of  breast cancer, hypothyroidism, HTN, and anxiety.  States has no family / friend support. Brother is in a NH / [facility]. PTA independent with ADL's, no DME usage. PCP: Dr.Edwin Avbuere.            S/p ERCP 03/29/2019... patient with pancreatic mass    NCM will continue to monitor for needs ....  Expected Discharge Plan: Home/Self Care(lives alone) Barriers to Discharge: Continued Medical Work up   Patient Goals and CMS Choice Patient states their goals for this hospitalization and ongoing recovery are:: to go home CMS Medicare.gov Compare Post Acute Care list provided to:: Patient    Expected Discharge Plan and Services Expected Discharge Plan: Home/Self Care(lives alone) In-house Referral: NA Discharge Planning Services: CM Consult Post Acute Care Choice: NA Living arrangements for the past 2 months: Apartment Expected Discharge Date: 03/31/19               DME Arranged: N/A DME Agency: NA       HH Arranged: NA HH Agency: NA     Representative spoke with at [place]: To go home  Prior Living Arrangements/Services Living arrangements for the past 2 months: Apartment Lives with:: Self Patient language and need for interpreter reviewed:: Yes Do you feel safe going back to the place where you live?: Yes      Need for Family Participation in Patient Care: No (Comment) Care giver support system in place?: No (comment)(Statesv has no support , brother in nursing home)   Criminal Activity/Legal Involvement Pertinent to Current Situation/Hospitalization: Yes - Comment as needed  Activities of Daily Living Home Assistive  Devices/Equipment: None ADL Screening (condition at time of admission) Patient's cognitive ability adequate to safely complete daily activities?: Yes Is the patient deaf or have difficulty hearing?: No Does the patient have difficulty seeing, even when wearing glasses/contacts?: No Does the patient have difficulty concentrating, remembering, or making decisions?: No Patient able to express need for assistance with ADLs?: Yes Does the patient have difficulty dressing or bathing?: No Independently performs ADLs?: Yes (appropriate for developmental age) Does the patient have difficulty walking or climbing stairs?: No Weakness of Legs: None Weakness of Arms/Hands: None  Permission Sought/Granted                  Emotional Assessment Appearance:: Appears stated age Attitude/Demeanor/Rapport: Engaged Affect (typically observed): Accepting Orientation: : Oriented to Self, Oriented to  Time, Oriented to Place, Oriented to Situation Alcohol / Substance Use: Other (comment) Psych Involvement: No (comment)  Admission diagnosis:  Jaundice [R17] Generalized weakness [R53.1] Patient Active Problem List   Diagnosis Date Noted  . Pancreatic mass 03/28/2019  . Elevated LFTs 03/28/2019  . Hypothyroidism 03/28/2019  . Diarrhea 03/28/2019  . Generalized weakness   . Jaundice   . Abnormal finding on GI tract imaging   . Elevated alkaline phosphatase level   . Cervical spine fracture ([place]) 07/31/2018   PCP:  Nolene Ebbs, MD Pharmacy:   [facility] [address], [place] - [address] [place] [address] Phone: [phone] Fax: [phone]  Social Determinants of Health (SDOH) Interventions    Readmission Risk Interventions No flowsheet data found.

## 2019-03-29 NOTE — Anesthesia Procedure Notes (Signed)
Procedure Name: Intubation Date/Time: 03/29/2019 7:52 AM Performed by: Shirlyn Goltz, CRNA Pre-anesthesia Checklist: Patient identified, Emergency Drugs available, Suction available and Patient being monitored Patient Re-evaluated:Patient Re-evaluated prior to induction Oxygen Delivery Method: Circle system utilized Preoxygenation: Pre-oxygenation with 100% oxygen Induction Type: Rapid sequence Laryngoscope Size: Glidescope and 3 Grade View: Grade I Tube type: Oral Tube size: 7.0 mm Number of attempts: 1 Airway Equipment and Method: Video-laryngoscopy and Stylet Placement Confirmation: ETT inserted through vocal cords under direct vision,  positive ETCO2 and breath sounds checked- equal and bilateral Secured at: 21 cm Tube secured with: Tape Dental Injury: Teeth and Oropharynx as per pre-operative assessment

## 2019-03-29 NOTE — Anesthesia Preprocedure Evaluation (Addendum)
Anesthesia Evaluation  Patient identified by MRN, date of birth, ID band Patient awake    Reviewed: Allergy & Precautions, NPO status , Patient's Chart, lab work & pertinent test results  History of Anesthesia Complications Negative for: history of anesthetic complications  Airway Mallampati: III  TM Distance: <3 FB Neck ROM: Limited    Dental  (+) Teeth Intact, Partial Lower   Pulmonary neg pulmonary ROS,    breath sounds clear to auscultation       Cardiovascular hypertension, Pt. on medications (-) angina(-) Past MI and (-) CHF  Rhythm:Regular     Neuro/Psych PSYCHIATRIC DISORDERS Anxiety    GI/Hepatic Neg liver ROS, pancreatic mass   Endo/Other  Hypothyroidism   Renal/GU negative Renal ROS     Musculoskeletal   Abdominal   Peds  Hematology   Anesthesia Other Findings   Reproductive/Obstetrics                             Anesthesia Physical Anesthesia Plan  ASA: III  Anesthesia Plan: General   Post-op Pain Management:    Induction: Intravenous and Rapid sequence  PONV Risk Score and Plan: 3 and Ondansetron and Dexamethasone  Airway Management Planned: Oral ETT  Additional Equipment: None  Intra-op Plan:   Post-operative Plan: Extubation in OR  Informed Consent: I have reviewed the patients History and Physical, chart, labs and discussed the procedure including the risks, benefits and alternatives for the proposed anesthesia with the patient or authorized representative who has indicated his/her understanding and acceptance.     Dental advisory given  Plan Discussed with: CRNA and Surgeon  Anesthesia Plan Comments:         Anesthesia Quick Evaluation

## 2019-03-29 NOTE — Transfer of Care (Signed)
Immediate Anesthesia Transfer of Care Note  Patient: Julie Rollins  Procedure(s) Performed: ESOPHAGOGASTRODUODENOSCOPY (EGD) WITH PROPOFOL (N/A ) UPPER ENDOSCOPIC ULTRASOUND (EUS) LINEAR (N/A ) ENDOSCOPIC RETROGRADE CHOLANGIOPANCREATOGRAPHY (ERCP) (N/A ) BIOPSY FINE NEEDLE ASPIRATION (FNA) RADIAL SPHINCTEROTOMY BILIARY STENT PLACEMENT  Patient Location: Endoscopy Unit  Anesthesia Type:General  Level of Consciousness: awake, alert , oriented and patient cooperative  Airway & Oxygen Therapy: Patient Spontanous Breathing  Post-op Assessment: Report given to RN and Post -op Vital signs reviewed and stable  Post vital signs: Reviewed and stable  Last Vitals:  Vitals Value Taken Time  BP 137/85 03/29/2019  9:43 AM  Temp 36.5 C 03/29/2019  9:43 AM  Pulse 84 03/29/2019  9:47 AM  Resp 20 03/29/2019  9:47 AM  SpO2 96 % 03/29/2019  9:47 AM  Vitals shown include unvalidated device data.  Last Pain:  Vitals:   03/29/19 0943  TempSrc: Oral  PainSc: 0-No pain         Complications: No apparent anesthesia complications

## 2019-03-29 NOTE — Plan of Care (Signed)
No variances at this time.   Problem: Education: Goal: Knowledge of General Education information will improve Description Including pain rating scale, medication(s)/side effects and non-pharmacologic comfort measures Outcome: Progressing   Problem: Education: Goal: Knowledge of General Education information will improve Description Including pain rating scale, medication(s)/side effects and non-pharmacologic comfort measures Outcome: Progressing   Problem: Health Behavior/Discharge Planning: Goal: Ability to manage health-related needs will improve Outcome: Progressing   Problem: Clinical Measurements: Goal: Ability to maintain clinical measurements within normal limits will improve Outcome: Progressing Goal: Will remain free from infection Outcome: Progressing Goal: Diagnostic test results will improve Outcome: Progressing Goal: Respiratory complications will improve Outcome: Progressing Goal: Cardiovascular complication will be avoided Outcome: Progressing   Problem: Activity: Goal: Risk for activity intolerance will decrease Outcome: Progressing   Problem: Nutrition: Goal: Adequate nutrition will be maintained Outcome: Progressing   Problem: Coping: Goal: Level of anxiety will decrease Outcome: Progressing   Problem: Elimination: Goal: Will not experience complications related to bowel motility Outcome: Progressing Goal: Will not experience complications related to urinary retention Outcome: Progressing   Problem: Pain Managment: Goal: General experience of comfort will improve Outcome: Progressing   Problem: Safety: Goal: Ability to remain free from injury will improve Outcome: Progressing   Problem: Skin Integrity: Goal: Risk for impaired skin integrity will decrease Outcome: Progressing

## 2019-03-29 NOTE — Progress Notes (Signed)
PROGRESS NOTE    Julie Rollins  TGY:[id number] DOB: [birth year] DOA: 03/28/2019 PCP: Nolene Ebbs, MD    Brief Narrative:  73 year old female who presented with jaundice.  She does have significant past medical history for breast cancer, hypothyroidism, hypertension and anxiety.  She developed diarrhea and jaundice.  Positive choluria for the last month, and jaundice for the last 7 days.  No fevers no chills.  She was advised by her daughter to come to the hospital.  On her initial physical examination, she had icterus, her pressure 141/59, heart rate 83, respiratory rate 16, oxygen saturation 97%.  Lungs were clear to auscultation bilaterally, heart S1-S2 present rhythmic, abdomen was distended, no lower extremity edema.  Sodium 132, potassium 5.8, chloride 100, bicarb 23, glucose 119, BUN 6, creatinine 0.4, alkaline phosphatase 1041, albumin 2.9, lipase 102, AST 318, ALT 346, total bilirubin 23.9, white count 6.3, hemoglobin 11.8, hematocrit 34.8, platelets 398, INR 1.1.  CT of the abdomen with 20 x 14 mm ill-defined low density in the pancreatic head concerning for possible neoplasm or malignancy.  Severe intrahepatic and intrahepatic biliary dilatation.  Patient was admitted to the hospital with a working diagnosis of obstructive jaundice due to pancreatic mass.  Assessment & Plan:   Active Problems:   Jaundice   Pancreatic mass   Elevated LFTs   Hypothyroidism   Diarrhea   1. Obstructive jaundice due to large pancreatic mass. Patient with no significant abdominal pain, continue to have significant icterus. Underwent egd and ercp today, stent placed at the common biliary duct. Biopsies take. Will continue to advance diet as tolerated. Follow with GI recommendations. Will dc telemetry. Will consult nutrition.   2.  Severe reflux esophagitis. Will continue antiacid with pantoprazole 40 mg bid, continue to advance diet as tolerated. Continue hydration with saline at 75 ml per H. As needed  antiemetics IV.   3. HTN, Continue blood pressure control with diltiazem.   4. Hypokalemia. Continue K correction with Kcl, will give 80 meq today, in 2 divided doses, follow on renal panel in am.   5. Hypothyroid. Continue levothyroxine.   6. Depression. Continue citaloram.    DVT prophylaxis: scd   Code Status: full Family Communication: no family at the bedside  Disposition Plan/ discharge barriers: pending clinical improvement  Body mass index is 23.61 kg/m. Malnutrition Type:      Malnutrition Characteristics:      Nutrition Interventions:     RN Pressure Injury Documentation:     Consultants:   GI   Procedures:   EGD   ERCP  Antimicrobials:       Subjective: Patient sp ERCP, no significant abdominal pain, persistent jaundice, no nausea or vomiting, no dyspnea or chest pain,   Objective: Vitals:   03/29/19 0955 03/29/19 1005 03/29/19 1113 03/29/19 1203  BP: (!) 159/69 (!) 162/67 (!) 141/59 (!) 163/74  Pulse: 82 75 80 85  Resp: 16 14    Temp:   (!) 97.5 F (36.4 C)   TempSrc:   Oral   SpO2: 97% 97% 97%   Weight:      Height:        Intake/Output Summary (Last 24 hours) at 03/29/2019 1237 Last data filed at 03/29/2019 0947 Gross per 24 hour  Intake 2161.25 ml  Output 1550 ml  Net 611.25 ml   Filed Weights   03/28/19 0743 03/28/19 1827  Weight: 63.5 kg 62.4 kg    Examination:   General: deconditioned and ill looking appearing.  Neurology: Awake and alert, non focal  E ENT: mild pallor, positive  icterus, oral mucosa dry Cardiovascular: No JVD. S1-S2 present, rhythmic, no gallops, rubs, or murmurs. Trace lower extremity edema. Pulmonary: positive breath sounds bilaterally, adequate air movement, no wheezing, rhonchi or rales. Gastrointestinal. Abdomen mild distended with no organomegaly, non tender, no rebound or guarding Skin. No rashes Musculoskeletal: no joint deformities     Data Reviewed: I have personally reviewed  following labs and imaging studies  CBC: Recent Labs  Lab 03/28/19 0800 03/29/19 0257  WBC 6.3 5.8  NEUTROABS 5.0 4.3  HGB 11.8* 8.9*  HCT 34.8* 26.0*  MCV 87.7 87.0  PLT 398 119   Basic Metabolic Panel: Recent Labs  Lab 03/28/19 0800 03/28/19 0818 03/28/19 1052 03/28/19 1638 03/29/19 0257  NA 132*  --   --  135 138  K 5.8*  --  3.4* 3.5 3.2*  CL 100  --   --  103 110  CO2 23  --   --  22 20*  GLUCOSE 119*  --   --  160* 97  BUN 6*  --   --  6* 6*  CREATININE 0.40* 0.40*  --  0.58 0.50  CALCIUM 9.2  --   --  9.0 8.0*  MG  --   --   --   --  1.8   GFR: Estimated Creatinine Clearance: 53.3 mL/min (by C-G formula based on SCr of 0.5 mg/dL). Liver Function Tests: Recent Labs  Lab 03/28/19 0800 03/29/19 0257  AST 318* 215*  ALT 346* 259*  ALKPHOS 1,141* 925*  BILITOT 23.9* 16.6*  PROT 6.4* 4.9*  ALBUMIN 2.9* 2.1*   Recent Labs  Lab 03/28/19 0800  LIPASE 102*   No results for input(s): AMMONIA in the last 168 hours. Coagulation Profile: Recent Labs  Lab 03/28/19 1430 03/29/19 0257  INR 1.1 1.3*   Cardiac Enzymes: Recent Labs  Lab 03/28/19 0800  TROPONINI 0.03*   BNP (last 3 results) No results for input(s): PROBNP in the last 8760 hours. HbA1C: Recent Labs    03/28/19 0803  HGBA1C 4.8   CBG: No results for input(s): GLUCAP in the last 168 hours. Lipid Profile: No results for input(s): CHOL, HDL, LDLCALC, TRIG, CHOLHDL, LDLDIRECT in the last 72 hours. Thyroid Function Tests: No results for input(s): TSH, T4TOTAL, FREET4, T3FREE, THYROIDAB in the last 72 hours. Anemia Panel: No results for input(s): VITAMINB12, FOLATE, FERRITIN, TIBC, IRON, RETICCTPCT in the last 72 hours.    Radiology Studies: I have reviewed all of the imaging during this hospital visit personally     Scheduled Meds: . citalopram  20 mg Oral QHS  . diltiazem  180 mg Oral Daily  . famotidine  20 mg Oral Daily  . feeding supplement (ENSURE ENLIVE)  237 mL Oral BID  BM  . gabapentin  300 mg Oral TID  . levothyroxine  50 mcg Oral Q0600  . pantoprazole  40 mg Oral BID AC   Continuous Infusions:   LOS: 1 day        Mauricio Gerome Apley, MD

## 2019-03-29 NOTE — Progress Notes (Signed)
Initial Nutrition Assessment  RD working remotely.  DOCUMENTATION CODES:   Not applicable  INTERVENTION:   -Continue Ensure Enlive po BID, each supplement provides 350 kcal and 20 grams of protein -MVI with minerals daily  NUTRITION DIAGNOSIS:   Increased nutrient needs related to acute illness(pancreatic mass) as evidenced by estimated needs.  GOAL:   Patient will meet greater than or equal to 90% of their needs  MONITOR:   PO intake, Supplement acceptance, Labs, Weight trends, Skin, I & O's  REASON FOR ASSESSMENT:   Malnutrition Screening Tool    ASSESSMENT:   Julie Rollins is a 73 y.o. female with medical history significant of breast cancer, hypothyroidism, HTN, and anxiety. She present with diarrhea and jaundice. She reports that she noticed about 1 month ago that her urine was very dark, but she had no burning, pain, or fevers. She did notice some increased frequency. She became concerned and talked to her PCP. Her w/u was negative. She then started having diarrhea shortly after. She didn't have any associated fever, but did have an episode of vomiting -- however, none in the last 2 weeks. She again, spoke with her PCP and her w/u was negative. She then noticed over the last week she has become jaundiced. She decided she needed to come to the ED and be checked out.   Pt admitted with pancreatic mass and elevated LFTs.   5/5- s/p EGD, EUS, ERCP-revealed severe ulcerative acid related esophagitis, pancreatic head mass which obstructs and dilates CBD but does not appear to obstruct of dilate pancreatic duct, pancreatic mass aspirated, likely obstructed cytic duct  Reviewed I/O's: +711 ml x 24 hours  UOP: 1.6 L x 24 hours  Attempted to speak with pt via phone, however, no answer when called into room.   Per MST assessment, pt reported both unintentional weight loss and eating poorly due to a decreased appetite PTA. Pt consumed only 50% of meal yesterday.  Reviewed wt  hx; pt has experienced a 7.6% wt loss over the past 8 months, which while not significant for time frame, is concerning given multiple medical problems.   Given pancreatic mass and possible diagnosis, pt with increased nutritional needs. Pt would benefit from addition of nutritional supplements.   Labs reviewed: K: 3.2.   NUTRITION - FOCUSED PHYSICAL EXAM:    Most Recent Value  Orbital Region  Unable to assess  Upper Arm Region  Unable to assess  Thoracic and Lumbar Region  Unable to assess  Buccal Region  Unable to assess  Temple Region  Unable to assess  Clavicle Bone Region  Unable to assess  Clavicle and Acromion Bone Region  Unable to assess  Scapular Bone Region  Unable to assess  Dorsal Hand  Unable to assess  Patellar Region  Unable to assess  Anterior Thigh Region  Unable to assess  Posterior Calf Region  Unable to assess  Edema (RD Assessment)  Unable to assess  Hair  Unable to assess  Eyes  Unable to assess  Mouth  Unable to assess  Skin  Unable to assess  Nails  Unable to assess       Diet Order:   Diet Order            Diet Heart Room service appropriate? Yes; Fluid consistency: Thin  Diet effective now              EDUCATION NEEDS:   No education needs have been identified at this time  Skin:  Skin Assessment: Reviewed RN Assessment  Last BM:  03/28/19  Height:   Ht Readings from Last 1 Encounters:  03/28/19 5\' 4"  (1.626 m)    Weight:   Wt Readings from Last 1 Encounters:  03/28/19 62.4 kg    Ideal Body Weight:  54.5 kg  BMI:  Body mass index is 23.61 kg/m.  Estimated Nutritional Needs:   Kcal:  1700-1900  Protein:  90-105 grams  Fluid:  1.7-1.9 L    Shara Hartis A. Jimmye Norman, RD, LDN, [place] Registered Dietitian II Certified Diabetes Care and Education Specialist Pager: [phone] After hours Pager: [phone]

## 2019-03-29 NOTE — Op Note (Signed)
[facility] Patient Name: Julie Rollins Procedure Date : 03/29/2019 MRN: [id number] Attending MD: [name] , MD Date of Birth: 12/05/1945 CSN: [id number] Age: 73 Admit Type: Inpatient Procedure:                ERCP Indications:              Painless jaundice, mass in pancreas on CT and EUS                            just prior to this exam, obstructed bile duct Providers:                [name], MD, Vista Lawman, RN, Charolette Child, Technician, Tawni Carnes, CRNA Referring MD:              Medicines:                General Anesthesia, Indomethacin 801 mg PR Complications:            No immediate complications. Estimated blood loss:                            None Estimated Blood Loss:     Estimated blood loss: none. Procedure:                Pre-Anesthesia Assessment:                           - Prior to the procedure, a History and Physical                            was performed, and patient medications and                            allergies were reviewed. The patient's tolerance of                            previous anesthesia was also reviewed. The risks                            and benefits of the procedure and the sedation                            options and risks were discussed with the patient.                            All questions were answered, and informed consent                            was obtained. Prior Anticoagulants: The patient has                            taken no previous anticoagulant or antiplatelet  agents. ASA Grade Assessment: II - A patient with                            mild systemic disease. After reviewing the risks                            and benefits, the patient was deemed in                            satisfactory condition to undergo the procedure.                           After obtaining informed consent, the scope was   passed under direct vision. Throughout the                            procedure, the patient's blood pressure, pulse, and                            oxygen saturations were monitored continuously. The                            [id number]-Q180V ([id number] Olympus Duodensocope was                            introduced through the mouth, and used to inject                            contrast into and used to inject contrast into the                            bile duct. The ERCP was accomplished without                            difficulty. The patient tolerated the procedure                            well. Scope In: Scope Out: Findings:      The scout film was normal. The esophagus was successfully intubated       under direct vision. The scope was advanced to a normal major papilla in       the descending duodenum without detailed examination of the pharynx,       larynx and associated structures, and upper GI tract. The major papilla       was protuberant and the mucosa just proximal to it was very irregular,       friable, slightly ulcerated (over 1-2cm). Biopsies were taken with       forceps of the abnormal mucosa. A 44 [employer]otome over a 0.035 hydrawire was       used to cannulate the bile duct and contrast was injected. 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 The CBD, CHD and right and left hepatic ducts were diffusely       dilated proximal to the stricture (  CBD 1.5cm). The cystic duct never       opacified. I performed a biliary sphincterotomy over the wire and then I       sampled the distal CBD stricture with a cytology brush. I used a forceps       to sample some of the yellow pancreatic parenchyma (tumor?) that was       exposed after the sphincterotomy (this was sent with the biopsies of the       abnormal mucosa described above since it was just deep to the mucosa       prior to the sphincterotomy). I sampled the distal CBD  stricture using a       biliary cytology brush and then placed a 4cm long 74mm diameter fully       covered metal biliary stent in good position with the distal 1cm of the       stent extending into the duodenum lumen. There was immediate delivery of       contrast and some dark bile after bile duct stent placement. The main       pancreatic duct was never cannulated with the wire or injected with       contrast. Impression:               - The major papilla was protuberant and the mucosa                            just proximal to it was very irregular, friable,                            slightly ulcerated (over 1-2cm). Biopsies were                            taken with forceps of the abnormal mucosa and also                            from yellowish pancreatic parenchyma that was deep                            to this mucosa and was exposed following biliary                            sphincterotomy.                           - Malignant appearing distal CBD stricture, sampled                            with biliary brushing and then stented with a 4cm                            long 86mm diameter fully covered metal stent. Recommendation:           - Return patient to hospital ward for observation,                            likely will be OK for discharge tomorrow if no post  procedure complications.                           - She will need medical oncology and surgery                            referrals.                           - Await results from EUS FNA, biliary stricture                            brushing and periampullary mucosa biopsies. Procedure Code(s):        --- Professional ---                           [phone], Endoscopic retrograde                            cholangiopancreatography (ERCP); with placement of                            endoscopic stent into biliary or pancreatic duct,                            including pre- and  post-dilation and guide wire                            passage, when performed, including sphincterotomy,                            when performed, each stent Diagnosis Code(s):        --- Professional ---                           K83.1, Obstruction of bile duct CPT copyright 2019 American [employer] Association. All rights reserved. The codes documented in this report are preliminary and upon coder review may  be revised to meet current compliance requirements. [name], MD 03/29/2019 10:03:54 AM This report has been signed electronically. Number of Addenda: 0

## 2019-03-30 ENCOUNTER — Telehealth: Payer: Self-pay | Admitting: Nurse Practitioner

## 2019-03-30 LAB — CBC
HCT: 29 % — ABNORMAL LOW (ref 36.0–46.0)
Hemoglobin: 9.9 g/dL — ABNORMAL LOW (ref 12.0–15.0)
MCH: 29.8 pg (ref 26.0–34.0)
MCHC: 34.1 g/dL (ref 30.0–36.0)
MCV: 87.3 fL (ref 80.0–100.0)
Platelets: 306 10*3/uL (ref 150–400)
RBC: 3.32 MIL/uL — ABNORMAL LOW (ref 3.87–5.11)
RDW: 23.8 % — ABNORMAL HIGH (ref 11.5–15.5)
WBC: 11.2 10*3/uL — ABNORMAL HIGH (ref 4.0–10.5)
nRBC: 0 % (ref 0.0–0.2)

## 2019-03-30 LAB — COMPREHENSIVE METABOLIC PANEL
ALT: 243 U/L — ABNORMAL HIGH (ref 0–44)
AST: 135 U/L — ABNORMAL HIGH (ref 15–41)
Albumin: 2.5 g/dL — ABNORMAL LOW (ref 3.5–5.0)
Alkaline Phosphatase: 951 U/L — ABNORMAL HIGH (ref 38–126)
Anion gap: 10 (ref 5–15)
BUN: 12 mg/dL (ref 8–23)
CO2: 23 mmol/L (ref 22–32)
Calcium: 9 mg/dL (ref 8.9–10.3)
Chloride: 103 mmol/L (ref 98–111)
Creatinine, Ser: 0.58 mg/dL (ref 0.44–1.00)
GFR calc Af Amer: >60 mL/min (ref >60)
GFR calc non Af Amer: >60 mL/min (ref >60)
Glucose, Bld: 132 mg/dL — ABNORMAL HIGH (ref 70–99)
Potassium: 4.6 mmol/L (ref 3.5–5.1)
Sodium: 136 mmol/L (ref 135–145)
Total Bilirubin: 8.3 mg/dL — ABNORMAL HIGH (ref 0.3–1.2)
Total Protein: 5.8 g/dL — ABNORMAL LOW (ref 6.5–8.1)

## 2019-03-30 MED ORDER — PANTOPRAZOLE SODIUM 20 MG PO TBEC: 20.0000 mg | DELAYED_RELEASE_TABLET | Freq: Two times a day (BID) | ORAL | 2 refills | Status: DC

## 2019-03-30 MED FILL — PANTOPRAZOLE SOD DR 20 MG T: 20 | 30 days supply | Qty: 60 | Fill #0 | Status: TO

## 2019-03-30 NOTE — Discharge Instructions (Signed)

## 2019-03-30 NOTE — Progress Notes (Signed)
     Progress Note    ASSESSMENT AND PLAN:    1. Obstructive jaundice. EUS with FNA yesterday >> pancreatic head mass not involving PD or significant vascular structures. Appears to involve periampullary duodenal mucosa ( vrs edema from mass). 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  -Patient can be discharged from GI standpoint.  Oncology appt already arranged for 04/04/19. We will contact her via telephone with FNA,bile duct brushing and periampullary biopsy result  2. Severe friable reflux esophagitis -BID PPI  SUBJECTIVE    no complaints other than no BM in 2 days  OBJECTIVE:     Vital signs in last 24 hours: Temp:  [97.5 F (36.4 C)-97.9 F (36.6 C)] 97.9 F (36.6 C) (05/06 0550) Pulse Rate:  [70-93] 70 (05/06 0550) Resp:  [16-17] 16 (05/06 0550) BP: (106-149)/(57-73) 113/58 (05/06 0550) SpO2:  [95 %-97 %] 97 % (05/05 2249) Last BM Date: 03/28/19 General:   Alert, well-developed female in NAD EENT:  Normal hearing, icteric sclera  Heart:  Regular rate and rhythm Pulm: Normal respiratory effor Abdomen:  Soft, nondistended, mild diffuse tenderness. Normall bowel sounds,.       Neurologic:  Alert and  oriented x4;  grossly normal neurologically. Psych:  Pleasant, cooperative.  Normal mood and affect.  Intake/Output from previous day: 05/05 0701 - 05/06 0700 In: 1584 [P.O.:684; I.V.:900] Out: 2250 [Urine:2250] Intake/Output this shift: No intake/output data recorded.  Lab Results: Recent Labs    03/28/19 0800 03/29/19 0257 03/30/19 0239  WBC 6.3 5.8 11.2*  HGB 11.8* 8.9* 9.9*  HCT 34.8* 26.0* 29.0*  PLT 398 256 306   BMET Recent Labs    03/28/19 1638 03/29/19 0257 03/30/19 0239  NA 135 138 136  K 3.5 3.2* 4.6  CL 103 110 103  CO2 22 20* 23  GLUCOSE 160* 97 132*  BUN 6* 6* 12  CREATININE 0.58 0.50 0.58  CALCIUM 9.0 8.0* 9.0   LFT Recent Labs    03/30/19 0239  PROT 5.8*  ALBUMIN 2.5*  AST 135*  ALT 243*   ALKPHOS 951*  BILITOT 8.3*   PT/INR Recent Labs    03/28/19 1430 03/29/19 0257  LABPROT 14.0 15.6*  INR 1.1 1.3*   Hepatitis Panel No results for input(s): HEPBSAG, HCVAB, HEPAIGM, HEPBIGM in the last 72 hours.  Dg Ercp Biliary & Pancreatic Ducts  Result Date: 03/29/2019 CLINICAL DATA:  73 year old female with a history pancreatic mass and biliary obstruction EXAM: ERCP TECHNIQUE: Multiple spot images obtained with the fluoroscopic device and submitted for interpretation post-procedure. FLUOROSCOPY TIME:  Fluoroscopy Time: 2 minutes 43 seconds COMPARISON:  CT 03/28/2011 FINDINGS: Limited intraoperative fluoroscopic spot images during ERCP demonstrates endoscope projecting over the upper abdomen with cannulation of the ampulla and retrograde injection of contrast. Partial opacification of the extrahepatic biliary ducts and intrahepatic biliary ducts demonstrating dilation. There is abrupt tapering of the distal common bile duct. Final image demonstrates placement of metal stent. IMPRESSION: Limited images during ERCP demonstrates treatment of common bile duct obstruction with metallic stent. Please refer to the dictated operative report for full details of intraoperative findings and procedure. Electronically Signed   By: Corrie Mckusick D.O.   On: 03/29/2019 09:57    Active Problems:   Jaundice   Pancreatic mass   Elevated LFTs   Hypothyroidism   Diarrhea     LOS: 2 days   Tye Savoy ,NP 03/30/2019, 12:18 PM

## 2019-03-30 NOTE — Progress Notes (Signed)
Brief Nutrition Follow-Up Note  RD re-consulted for assessment of nutritional requirements/ needs.   RD evaluated pt on 03/29/19; please refer to note for further details.   Pt with active discharge orders in today. RD provided information on how to obtain recommended nutritional supplements in discharge summary/AVS.   If further nutritional needs are required prior to discharge today, please re-consult RD.   Kavir Savoca A. Jimmye Norman, RD, LDN, [place] Registered Dietitian II Certified Diabetes Care and Education Specialist Pager: [phone] After hours Pager: [phone]

## 2019-03-30 NOTE — Telephone Encounter (Signed)
A new patient appt has been scheduled for Julie Rollins to see Cira Rue, NP/Dr. [name] on 5/11 at 1:45pm Pt is currently in the hospital.

## 2019-03-30 NOTE — Progress Notes (Signed)
Patient was stable at discharge. I removed their IV. We reviewed the discharge education. Patient/Family verbalized understanding and had no further questions. Patient left with prescription/s in hand.  

## 2019-03-30 NOTE — TOC Transition Note (Signed)
Transition of Care [facility]) - CM/SW Discharge Note   Patient Details  Name: Julie Rollins MRN: [id number] Date of Birth: 1946/08/25  [facility] CM/SW Contact:  Sharin Mons, RN Phone Number: 03/30/2019, 11:34 AM   Clinical Narrative:    Transition to home with self care. Pt with transportation to home, states friend/neighbor will be picking her up.    Barriers to Discharge: Barriers Resolved   Patient Goals and CMS Choice Patient states their goals for this hospitalization and ongoing recovery are:: to go home CMS Medicare.gov Compare Post Acute Care list provided to:: Patient    Discharge Placement                       Discharge Plan and Services In-house Referral: NA Discharge Planning Services: CM Consult Post Acute Care Choice: NA          DME Arranged: N/A DME Agency: NA       HH Arranged: NA HH Agency: NA     Representative spoke with at [place]: To go home  Social Determinants of Health (SDOH) Interventions     Readmission Risk Interventions No flowsheet data found.

## 2019-03-30 NOTE — Discharge Summary (Signed)
Physician Discharge Summary  AVEYAH GREENWOOD XAJ:[id number] DOB: [birth year] DOA: 03/28/2019  PCP: Nolene Ebbs, MD  Admit date: 03/28/2019 Discharge date: 03/30/2019  Admitted From: Home  Disposition:  Home   Recommendations for Outpatient Follow-up:  1. Follow up with Oncology in 5 days 2. Oncology: Please obtain BMP/CBC in one week 3. Follow up with GI as directed by Dr. Ardis Hughs 4. Please follow up on the following pending results: pancreas mass biopsy      Home Health: None  Equipment/Devices: None  Discharge Condition: Fair  CODE STATUS: FULL Diet recommendation: Soft  Brief/Interim Summary: Mrs. Trefry is a 73 y.o. F with hx BrCa remote (lumpectomy/radx in 2000), hypothyroidism, HTN and anxiety who presented with painless jaundice.   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  Brushings and biopsies were obtained.  Post ERCP, she was able to resume a normal diet without pain or vomiting.  The case was discussed with Dr. [name] from oncology, who will arrange outpatient follow-up for oncology consultation, and will coordinate with general surgery.   Severe reflux esophagitis This was noted incidentally on EGD. -PPI BID for at least 30 days -Follow up with GI  Hypertension  Hypokalemia Resolved  Hypothyroidism  Depression  SARS-CoV-2 testing obtained for screening purposes, test result negative.  COVID ruled out.     Discharge Instructions  Discharge Instructions    Diet general   Complete by:  As directed    Discharge instructions   Complete by:  As directed    From Dr. Loleta Books: You were admitted for "painless jaundice", which  means yellowing of the skin without severe right sided belly pain.  This is usually caused by a blockage of the gallducts by the pancreas, and this was confirmed in your case by a CAT scan showing a mass in your pancreas.  You had a stent (a small metal tube) placed to prop open the gall ducts, and allow them to drain.  The jaundice should improve.  A biopsy was taken, to see if the mass is cancer of the pancreas. Dr. Ardis Hughs office will call you on the phone to follow up.  If you haven't heard from them by the end of next week, call their office at the number listed below in the "To Do" section.   MORE IMPORTANT: I have spoken with Dr. [name] from the [name].   The [facility] will contact you this week for a follow up appointment either with Dr. [name] or with Dr. Benay Spice, the GI cancer specialists.   They will discuss the biopsy results, and any additional testing, as well as treatment options. If you haven't heard from the cancer center by next Monday, call them at [phone] and explain that you were in the hospital, and that your doctor in the hospital (Dr. Loleta Books) spoke with Dr. [name] and recommended follow up.   Lastly, Dr. Ardis Hughs noticed just incidentally by accident that you also have some irritation of your esophagus (food tube) by stomach acid. YOu should take an antacid (an acid suppressing medicine like Pantoprazole 20 mg twice daily, half hour before a meal) for hte next 3 months.   Increase activity slowly  Complete by:  As directed      Allergies as of 03/30/2019      Reactions   Codeine Nausea Only      Medication List    STOP taking these medications   famotidine 20 MG tablet Commonly known as:  PEPCID   GOODY HEADACHE PO   ibuprofen 800 MG tablet Commonly known as:  ADVIL     TAKE these medications   acetaminophen 325 MG tablet Commonly known as:  TYLENOL Take 2 tablets (650 mg total) by mouth every 8 (eight) hours.   citalopram 20 MG  tablet Commonly known as:  CELEXA Take 20 mg by mouth at bedtime.   diltiazem 180 MG 24 hr capsule Commonly known as:  CARDIZEM CD Take 180 mg by mouth daily.   docusate sodium 100 MG capsule Commonly known as:  COLACE Take 1 capsule (100 mg total) by mouth 2 (two) times daily.   Durezol 0.05 % Emul Generic drug:  Difluprednate Place 1 drop into both eyes 2 (two) times daily.   gabapentin 300 MG capsule Commonly known as:  NEURONTIN Take 1 capsule (300 mg total) by mouth 3 (three) times daily.   levothyroxine 50 MCG tablet Commonly known as:  SYNTHROID Take 50 mcg by mouth every morning.   methocarbamol 500 MG tablet Commonly known as:  ROBAXIN Take 1 tablet (500 mg total) by mouth every 8 (eight) hours as needed for muscle spasms.   ofloxacin 0.3 % ophthalmic solution Commonly known as:  OCUFLOX Place 1 drop into both eyes 2 (two) times daily.   pantoprazole 20 MG tablet Commonly known as:  Protonix Take 1 tablet (20 mg total) by mouth 2 (two) times daily before a meal.   polyethylene glycol 17 g packet Commonly known as:  MIRALAX / GLYCOLAX Take 17 g by mouth daily.      Follow-up Information    [name], MD Follow up.   Specialty:  Gastroenterology Why:  Dr. Ardis Hughs office (the GI specialist) will contact you by phone to follow up.  If you haven't heard from them by the end of next week, call their office to follow up. Contact information: [address] [address] [phone]          Allergies  Allergen Reactions  . Codeine Nausea Only    Consultations:  GI   Procedures/Studies: Ct Abdomen Pelvis W Contrast  Result Date: 03/28/2019 CLINICAL DATA:  Jaundice. EXAM: CT ABDOMEN AND PELVIS WITH CONTRAST TECHNIQUE: Multidetector CT imaging of the abdomen and pelvis was performed using the standard protocol following bolus administration of intravenous contrast. CONTRAST:  164m OMNIPAQUE IOHEXOL 300 MG/ML  SOLN COMPARISON:  CT  scan of July 31, 2018. FINDINGS: Lower chest: No acute abnormality. Hepatobiliary: There is interval development of severe intrahepatic and extrahepatic biliary dilatation it appears to be due to possible pancreatic neoplasm. Gallbladder distention is noted without cholelithiasis. No gallbladder wall thickening or pericholecystic fluid is noted. No definite hepatic mass is noted. Pancreas: 20 x 14 mm ill-defined low density is noted in pancreatic head concerning for possible neoplasm. Mild ductal dilatation is noted. No evidence of pancreatic inflammation is noted. Spleen: Normal in size without focal abnormality. Adrenals/Urinary Tract: Adrenal glands are unremarkable. Kidneys are normal, without renal calculi, focal lesion, or hydronephrosis. Bladder is unremarkable. Stomach/Bowel: Stomach is within normal limits. Appendix appears normal. No evidence of bowel wall thickening, distention, or inflammatory changes. Sigmoid diverticulosis is noted without inflammation. Vascular/Lymphatic: No significant vascular  findings are present. No enlarged abdominal or pelvic lymph nodes. Reproductive: Status post hysterectomy. No adnexal masses. Other: No abdominal wall hernia or abnormality. No abdominopelvic ascites. Musculoskeletal: No acute or significant osseous findings. IMPRESSION: Interval development of 20 x 14 mm ill-defined low density in pancreatic head concerning for possible neoplasm or malignancy. This appears to be resulting in severe intrahepatic and extrahepatic biliary dilatation. ERCP is recommended for further evaluation. Sigmoid diverticulosis without inflammation. Electronically Signed   By: Marijo Conception M.D.   On: 03/28/2019 09:40   Dg Ercp Biliary & Pancreatic Ducts  Result Date: 03/29/2019 CLINICAL DATA:  73 year old female with a history pancreatic mass and biliary obstruction EXAM: ERCP TECHNIQUE: Multiple spot images obtained with the fluoroscopic device and submitted for interpretation  post-procedure. FLUOROSCOPY TIME:  Fluoroscopy Time: 2 minutes 43 seconds COMPARISON:  CT 03/28/2011 FINDINGS: Limited intraoperative fluoroscopic spot images during ERCP demonstrates endoscope projecting over the upper abdomen with cannulation of the ampulla and retrograde injection of contrast. Partial opacification of the extrahepatic biliary ducts and intrahepatic biliary ducts demonstrating dilation. There is abrupt tapering of the distal common bile duct. Final image demonstrates placement of metal stent. IMPRESSION: Limited images during ERCP demonstrates treatment of common bile duct obstruction with metallic stent. Please refer to the dictated operative report for full details of intraoperative findings and procedure. Electronically Signed   By: Corrie Mckusick D.O.   On: 03/29/2019 09:57       Subjective: Feeling well.  No vomiting or nausea with eating.  No confusion, fever.  Jaundice seems better.  Discharge Exam: Vitals:   03/29/19 2249 03/30/19 0550  BP: (!) 106/57 (!) 113/58  Pulse: 79 70  Resp: 17 16  Temp: (!) 97.5 F (36.4 C) 97.9 F (36.6 C)  SpO2: 97%    Vitals:   03/29/19 1401 03/29/19 1449 03/29/19 2249 03/30/19 0550  BP: (!) 148/67 (!) 149/73 (!) 106/57 (!) 113/58  Pulse: 90 93 79 70  Resp:   17 16  Temp:  (!) 97.5 F (36.4 C) (!) 97.5 F (36.4 C) 97.9 F (36.6 C)  TempSrc:  Oral Oral Oral  SpO2:  95% 97%   Weight:      Height:        General: Pt is alert, awake, not in acute distress; extremely jaundiced, scleral icterus noted Cardiovascular: RRR, nl S1-S2, no murmurs appreciated.   No LE edema.   Respiratory: Normal respiratory rate and rhythm.  CTAB without rales or wheezes. Abdominal: Abdomen soft and non-tender.  No distension or HSM.   Neuro/Psych: Strength symmetric in upper and lower extremities.  Judgment and insight appear normal.   The results of significant diagnostics from this hospitalization (including imaging, microbiology, ancillary and  laboratory) are listed below for reference.     Microbiology: Recent Results (from the past 240 hour(s))  SARS Coronavirus 2 [facility] order, Performed in [facility] hospital lab)     Status: None   Collection Time: 03/28/19 11:26 AM  Result Value Ref Range Status   SARS Coronavirus 2 NEGATIVE NEGATIVE Final    Comment: (NOTE) If result is NEGATIVE SARS-CoV-2 target nucleic acids are NOT DETECTED. The SARS-CoV-2 RNA is generally detectable in upper and lower  respiratory specimens during the acute phase of infection. The lowest  concentration of SARS-CoV-2 viral copies this assay can detect is 250  copies / mL. A negative result does not preclude SARS-CoV-2 infection  and should not be used as the sole basis for treatment or  other  patient management decisions.  A negative result may occur with  improper specimen collection / handling, submission of specimen other  than nasopharyngeal swab, presence of viral mutation(s) within the  areas targeted by this assay, and inadequate number of viral copies  (<250 copies / mL). A negative result must be combined with clinical  observations, patient history, and epidemiological information. If result is POSITIVE SARS-CoV-2 target nucleic acids are DETECTED. The SARS-CoV-2 RNA is generally detectable in upper and lower  respiratory specimens dur ing the acute phase of infection.  Positive  results are indicative of active infection with SARS-CoV-2.  Clinical  correlation with patient history and other diagnostic information is  necessary to determine patient infection status.  Positive results do  not rule out bacterial infection or co-infection with other viruses. If result is PRESUMPTIVE POSTIVE SARS-CoV-2 nucleic acids MAY BE PRESENT.   A presumptive positive result was obtained on the submitted specimen  and confirmed on repeat testing.  While 2019 novel coronavirus  (SARS-CoV-2) nucleic acids may be present in the submitted sample   additional confirmatory testing may be necessary for epidemiological  and / or clinical management purposes  to differentiate between  SARS-CoV-2 and other Sarbecovirus currently known to infect humans.  If clinically indicated additional testing with an alternate test  methodology [phone]) is advised. The SARS-CoV-2 RNA is generally  detectable in upper and lower respiratory sp ecimens during the acute  phase of infection. The expected result is Negative. Fact Sheet for Patients:  StrictlyIdeas.no Fact Sheet for Healthcare Providers: BankingDealers.co.za This test is not yet approved or cleared by the [place] FDA and has been authorized for detection and/or diagnosis of SARS-CoV-2 by FDA under an Emergency Use Authorization (EUA).  This EUA will remain in effect (meaning this test can be used) for the duration of the COVID-19 declaration under Section 564(b)(1) of the Act, 21 U.S.C. section 360bbb-3(b)(1), unless the authorization is terminated or revoked sooner. Performed at [facility], [place] [address] [place], [place] [address]   C difficile quick scan w PCR reflex     Status: None   Collection Time: 03/28/19  4:54 PM  Result Value Ref Range Status   C Diff antigen NEGATIVE NEGATIVE Final   C Diff toxin NEGATIVE NEGATIVE Final   C Diff interpretation No C. difficile detected.  Final    Comment: Performed at [facility], [place] [address] [place], [place] [address]     Labs: BNP (last 3 results) No results for input(s): BNP in the last 8760 hours. Basic Metabolic Panel: Recent Labs  Lab 03/28/19 0800 03/28/19 0818 03/28/19 1052 03/28/19 1638 03/29/19 0257 03/30/19 0239  NA 132*  --   --  135 138 136  K 5.8*  --  3.4* 3.5 3.2* 4.6  CL 100  --   --  103 110 103  CO2 23  --   --  22 20* 23  GLUCOSE 119*  --   --  160* 97 132*  BUN 6*  --   --  6* 6* 12  CREATININE 0.40* 0.40*  --  0.58 0.50 0.58   CALCIUM 9.2  --   --  9.0 8.0* 9.0  MG  --   --   --   --  1.8  --    Liver Function Tests: Recent Labs  Lab 03/28/19 0800 03/29/19 0257 03/30/19 0239  AST 318* 215* 135*  ALT 346* 259* 243*  ALKPHOS 1,141*  925* 951*  BILITOT 23.9* 16.6* 8.3*  PROT 6.4* 4.9* 5.8*  ALBUMIN 2.9* 2.1* 2.5*   Recent Labs  Lab 03/28/19 0800  LIPASE 102*   No results for input(s): AMMONIA in the last 168 hours. CBC: Recent Labs  Lab 03/28/19 0800 03/29/19 0257 03/30/19 0239  WBC 6.3 5.8 11.2*  NEUTROABS 5.0 4.3  --   HGB 11.8* 8.9* 9.9*  HCT 34.8* 26.0* 29.0*  MCV 87.7 87.0 87.3  PLT 398 256 306   Cardiac Enzymes: Recent Labs  Lab 03/28/19 0800  TROPONINI 0.03*   BNP: Invalid input(s): POCBNP CBG: No results for input(s): GLUCAP in the last 168 hours. D-Dimer No results for input(s): DDIMER in the last 72 hours. Hgb A1c Recent Labs    03/28/19 0803  HGBA1C 4.8   Lipid Profile No results for input(s): CHOL, HDL, LDLCALC, TRIG, CHOLHDL, LDLDIRECT in the last 72 hours. Thyroid function studies No results for input(s): TSH, T4TOTAL, T3FREE, THYROIDAB in the last 72 hours.  Invalid input(s): FREET3 Anemia work up No results for input(s): VITAMINB12, FOLATE, FERRITIN, TIBC, IRON, RETICCTPCT in the last 72 hours. Urinalysis    Component Value Date/Time   COLORURINE AMBER (A) 03/28/2019 0939   APPEARANCEUR CLEAR 03/28/2019 0939   LABSPEC 1.019 03/28/2019 0939   PHURINE 6.0 03/28/2019 0939   GLUCOSEU NEGATIVE 03/28/2019 0939   HGBUR NEGATIVE 03/28/2019 0939   BILIRUBINUR SMALL (A) 03/28/2019 0939   KETONESUR NEGATIVE 03/28/2019 0939   PROTEINUR NEGATIVE 03/28/2019 0939   UROBILINOGEN 0.2 06/16/2014 0529   NITRITE NEGATIVE 03/28/2019 0939   LEUKOCYTESUR NEGATIVE 03/28/2019 0939   Sepsis Labs Invalid input(s): PROCALCITONIN,  WBC,  LACTICIDVEN Microbiology Recent Results (from the past 240 hour(s))  SARS Coronavirus 2 [facility] order, Performed in [place] hospital  lab)     Status: None   Collection Time: 03/28/19 11:26 AM  Result Value Ref Range Status   SARS Coronavirus 2 NEGATIVE NEGATIVE Final    Comment: (NOTE) If result is NEGATIVE SARS-CoV-2 target nucleic acids are NOT DETECTED. The SARS-CoV-2 RNA is generally detectable in upper and lower  respiratory specimens during the acute phase of infection. The lowest  concentration of SARS-CoV-2 viral copies this assay can detect is 250  copies / mL. A negative result does not preclude SARS-CoV-2 infection  and should not be used as the sole basis for treatment or other  patient management decisions.  A negative result may occur with  improper specimen collection / handling, submission of specimen other  than nasopharyngeal swab, presence of viral mutation(s) within the  areas targeted by this assay, and inadequate number of viral copies  (<250 copies / mL). A negative result must be combined with clinical  observations, patient history, and epidemiological information. If result is POSITIVE SARS-CoV-2 target nucleic acids are DETECTED. The SARS-CoV-2 RNA is generally detectable in upper and lower  respiratory specimens dur ing the acute phase of infection.  Positive  results are indicative of active infection with SARS-CoV-2.  Clinical  correlation with patient history and other diagnostic information is  necessary to determine patient infection status.  Positive results do  not rule out bacterial infection or co-infection with other viruses. If result is PRESUMPTIVE POSTIVE SARS-CoV-2 nucleic acids MAY BE PRESENT.   A presumptive positive result was obtained on the submitted specimen  and confirmed on repeat testing.  While 2019 novel coronavirus  (SARS-CoV-2) nucleic acids may be present in the submitted sample  additional confirmatory testing may be necessary for epidemiological  and / or clinical management purposes  to differentiate between  SARS-CoV-2 and other Sarbecovirus currently  known to infect humans.  If clinically indicated additional testing with an alternate test  methodology [phone]) is advised. The SARS-CoV-2 RNA is generally  detectable in upper and lower respiratory sp ecimens during the acute  phase of infection. The expected result is Negative. Fact Sheet for Patients:  StrictlyIdeas.no Fact Sheet for Healthcare Providers: BankingDealers.co.za This test is not yet approved or cleared by the [place] FDA and has been authorized for detection and/or diagnosis of SARS-CoV-2 by FDA under an Emergency Use Authorization (EUA).  This EUA will remain in effect (meaning this test can be used) for the duration of the COVID-19 declaration under Section 564(b)(1) of the Act, 21 U.S.C. section 360bbb-3(b)(1), unless the authorization is terminated or revoked sooner. Performed at [facility], [place] [address], [place], [place] [address]   C difficile quick scan w PCR reflex     Status: None   Collection Time: 03/28/19  4:54 PM  Result Value Ref Range Status   C Diff antigen NEGATIVE NEGATIVE Final   C Diff toxin NEGATIVE NEGATIVE Final   C Diff interpretation No C. difficile detected.  Final    Comment: Performed at [facility], [place] [address], [place], [place] [address]     Time coordinating discharge: 40 minutes      SIGNED:   Edwin Dada, MD  [facility] 03/30/2019, 1:47 PM

## 2019-03-31 ENCOUNTER — Encounter (HOSPITAL_COMMUNITY): Payer: Self-pay | Admitting: Gastroenterology

## 2019-03-31 NOTE — Anesthesia Postprocedure Evaluation (Signed)
Anesthesia Post Note  Patient: Julie Rollins  Procedure(s) Performed: ESOPHAGOGASTRODUODENOSCOPY (EGD) WITH PROPOFOL (N/A ) UPPER ENDOSCOPIC ULTRASOUND (EUS) LINEAR (N/A ) ENDOSCOPIC RETROGRADE CHOLANGIOPANCREATOGRAPHY (ERCP) (N/A ) BIOPSY FINE NEEDLE ASPIRATION (FNA) SPHINCTEROTOMY BILIARY STENT PLACEMENT BILIARY BRUSHING     Patient location during evaluation: Endoscopy Anesthesia Type: General Level of consciousness: awake and alert Pain management: pain level controlled Vital Signs Assessment: post-procedure vital signs reviewed and stable Respiratory status: spontaneous breathing, nonlabored ventilation, respiratory function stable and patient connected to nasal cannula oxygen Cardiovascular status: blood pressure returned to baseline and stable Postop Assessment: no apparent nausea or vomiting Anesthetic complications: no    Last Vitals:  Vitals:   03/29/19 2249 03/30/19 0550  BP: (!) 106/57 (!) 113/58  Pulse: 79 70  Resp: 17 16  Temp: (!) 36.4 C 36.6 C  SpO2: 97%     Last Pain:  Vitals:   03/30/19 0730  TempSrc:   PainSc: 0-No pain                 Kenidy Crossland

## 2019-04-01 ENCOUNTER — Other Ambulatory Visit: Payer: Self-pay | Admitting: General Surgery

## 2019-04-04 ENCOUNTER — Inpatient Hospital Stay: Payer: Medicare HMO | Attending: Nurse Practitioner | Admitting: Nurse Practitioner

## 2019-04-04 ENCOUNTER — Inpatient Hospital Stay: Payer: Medicare HMO

## 2019-04-04 ENCOUNTER — Other Ambulatory Visit: Payer: Self-pay

## 2019-04-04 ENCOUNTER — Other Ambulatory Visit: Payer: Self-pay | Admitting: Hematology

## 2019-04-04 ENCOUNTER — Other Ambulatory Visit: Payer: Self-pay | Admitting: Genetic Counselor

## 2019-04-04 ENCOUNTER — Encounter: Payer: Self-pay | Admitting: Nurse Practitioner

## 2019-04-04 VITALS — BP 134/73 | HR 83 | Temp 98.3°F | Resp 18 | Ht 64.0 in | Wt 134.4 lb

## 2019-04-04 DIAGNOSIS — K8689 Other specified diseases of pancreas: Secondary | ICD-10-CM

## 2019-04-04 DIAGNOSIS — R112 Nausea with vomiting, unspecified: Secondary | ICD-10-CM | POA: Insufficient documentation

## 2019-04-04 DIAGNOSIS — Z86000 Personal history of in-situ neoplasm of breast: Secondary | ICD-10-CM | POA: Insufficient documentation

## 2019-04-04 DIAGNOSIS — C784 Secondary malignant neoplasm of small intestine: Secondary | ICD-10-CM | POA: Insufficient documentation

## 2019-04-04 DIAGNOSIS — Z8052 Family history of malignant neoplasm of bladder: Secondary | ICD-10-CM | POA: Diagnosis not present

## 2019-04-04 DIAGNOSIS — G4441 Drug-induced headache, not elsewhere classified, intractable: Secondary | ICD-10-CM | POA: Diagnosis not present

## 2019-04-04 DIAGNOSIS — D490 Neoplasm of unspecified behavior of digestive system: Secondary | ICD-10-CM

## 2019-04-04 DIAGNOSIS — C250 Malignant neoplasm of head of pancreas: Secondary | ICD-10-CM | POA: Diagnosis not present

## 2019-04-04 DIAGNOSIS — Z79899 Other long term (current) drug therapy: Secondary | ICD-10-CM | POA: Insufficient documentation

## 2019-04-04 DIAGNOSIS — Z5111 Encounter for antineoplastic chemotherapy: Secondary | ICD-10-CM | POA: Diagnosis not present

## 2019-04-04 DIAGNOSIS — E039 Hypothyroidism, unspecified: Secondary | ICD-10-CM

## 2019-04-04 DIAGNOSIS — I10 Essential (primary) hypertension: Secondary | ICD-10-CM | POA: Diagnosis not present

## 2019-04-04 DIAGNOSIS — Z923 Personal history of irradiation: Secondary | ICD-10-CM | POA: Insufficient documentation

## 2019-04-04 DIAGNOSIS — R197 Diarrhea, unspecified: Secondary | ICD-10-CM | POA: Diagnosis not present

## 2019-04-04 DIAGNOSIS — K209 Esophagitis, unspecified: Secondary | ICD-10-CM | POA: Diagnosis not present

## 2019-04-04 DIAGNOSIS — R609 Edema, unspecified: Secondary | ICD-10-CM | POA: Diagnosis not present

## 2019-04-04 MED ORDER — LIDOCAINE-PRILOCAINE 2.5-2.5 % EX CREA: TOPICAL_CREAM | CUTANEOUS | 3 refills | Status: DC

## 2019-04-04 MED ORDER — PROCHLORPERAZINE MALEATE 10 MG PO TABS: 10.0000 mg | ORAL_TABLET | Freq: Four times a day (QID) | ORAL | 1 refills | Status: DC | PRN

## 2019-04-04 MED ORDER — ONDANSETRON HCL 8 MG PO TABS: 8.0000 mg | ORAL_TABLET | Freq: Two times a day (BID) | ORAL | 1 refills | Status: DC | PRN

## 2019-04-04 NOTE — Progress Notes (Signed)
Ordered genetic testing for pancreatic cancer

## 2019-04-04 NOTE — Progress Notes (Signed)
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
## 2019-04-05 ENCOUNTER — Telehealth: Payer: Self-pay

## 2019-04-05 ENCOUNTER — Telehealth: Payer: Self-pay | Admitting: Nurse Practitioner

## 2019-04-05 NOTE — Telephone Encounter (Signed)
Introductory call placed to Julie Rollins. Patient lives alone, no living family but does have a friend who can assist with transportation occasionally. Patient is interested in transportation program. Message sent to transportation coordinator. Social work referral placed by NP. Patient understands that she will be receiving a call to schedule chemo class and next appointments. She will also receive a call to schedule her CT chest. I provided my direct contact number for questions or concerns.

## 2019-04-05 NOTE — Telephone Encounter (Signed)
Scheduled appt per 5/11 los.  Central radiology will contact patient about the scan appt and once the appt is made a lab appt will be added.  Patient aware that the patient education class is a phone visit only.

## 2019-04-06 ENCOUNTER — Telehealth: Payer: Self-pay

## 2019-04-06 ENCOUNTER — Inpatient Hospital Stay: Payer: Medicare HMO

## 2019-04-06 ENCOUNTER — Encounter: Payer: Self-pay | Admitting: General Surgery

## 2019-04-06 ENCOUNTER — Encounter: Payer: Self-pay | Admitting: General Practice

## 2019-04-06 NOTE — Progress Notes (Addendum)
Nutrition Assessment   Reason for Assessment:   Referral from [facility] for new pancreatic cancer   ASSESSMENT:  RD working remotely.  73 year old female with pancreatic cancer with duodenal involvement.  Planning neoadjuvant chemotherapy with possible surgery.    Spoke with patient via phone this pm.  Patient reports that appetite is "pretty good"  Reports that she lives alone and does not do much cooking.  Reports that for breakfast may have oatmeal or cereal or get a egg, grits from [employer].  Lunch she usually goes out and gets as well. Today was [employer] apple and pecan salad (small) with baked potato.  Supper is usually a sandwich.  She is concerned about not feeling well during treatment and not feeling like going to get food or preparing food. Has been drinking ensure plus (vanilla and strawberry) and carnation breakfast essentials mixed with 2% milk.  Usually drinks 2 per day (1 of each or 2 of the same).    Reports some nausea but medication help.  Reports right sided pain but takes Tylenol which helps. Reports 1-2 bowel movements per day (more normal in consistency and color than when in the hospital).  Reports that she is typically constipated.  Denies diarrhea.     Nutrition Focused Physical Exam: deferred   Medications: protonix, zofran   Labs: glucose 132   Anthropometrics:    Height: 64 inches Weight: 134 lb 6.4 oz on 5/11 UBW: Patient reports weight loss of 3-5 lb recently.  Noted 08/02/18 148 lb BMI:    Estimated Energy Needs  Kcals: 1800-2100 Protein: 90-105 g Fluid: 2 L/d   NUTRITION DIAGNOSIS: 23  9% weight loss in the last 8 months   INTERVENTION:  Discussed importance of good nutrition during treatment Encouraged patient to continue high calorie shake at least 2 times per day Discussed good sources of protein and encouraged at every meal/snack. Patient concerned about preparing meals during treatment as lives alone.  We discussed easy to  prepare foods and foods to have on hand during treatment.   Contact information provided   MONITORING, EVALUATION, GOAL: Patient will consume adequate calories and protein to maintain weight during treatment   Next Visit: phone f/u, Tuesday, June 9  Julie Rollins B. Zenia Resides, [place], [place] Registered Dietitian [phone] (pager)

## 2019-04-06 NOTE — Telephone Encounter (Signed)
Spoke with Pt , Advised that CT scan of the chest is scheduled for Friday 5/15 @8 :30. This scan has been authorized. Pt verbalized understanding of the time and day of appointment.

## 2019-04-06 NOTE — Progress Notes (Signed)
  [facility] CSW Progress Notes  Request received from dietitian Jennet Maduro to contact patient as she is socially isolated and may be in need of resources.  Called patient.  She was awaiting multiple phone calls and did not want to talk at this time.  Requested CSW call her next week.  Verbalized multiple upcoming appointments, concern over how to get to port placement which requires someone to be with her.  Is managing to get transportation needed to [facility] through [employer] - friend is available to take her and stay with her if port placement appt is changed to early next week.  Aware she will start chemotherapy next week.  Sounds stressed, but wanted to handle multiple calls and make arrangements needed to proceed w port placement.  CSW will call her at a later date.  Edwyna Shell, LCSW Clinical Social Worker Phone:  [phone]

## 2019-04-07 ENCOUNTER — Ambulatory Visit (INDEPENDENT_AMBULATORY_CARE_PROVIDER_SITE_OTHER): Payer: Medicare HMO | Admitting: Gastroenterology

## 2019-04-07 ENCOUNTER — Encounter: Payer: Self-pay | Admitting: Gastroenterology

## 2019-04-07 ENCOUNTER — Telehealth: Payer: Self-pay | Admitting: Hematology

## 2019-04-07 ENCOUNTER — Other Ambulatory Visit: Payer: Self-pay

## 2019-04-07 ENCOUNTER — Encounter (HOSPITAL_BASED_OUTPATIENT_CLINIC_OR_DEPARTMENT_OTHER): Payer: Self-pay

## 2019-04-07 VITALS — Ht 64.0 in | Wt 134.0 lb

## 2019-04-07 DIAGNOSIS — C250 Malignant neoplasm of head of pancreas: Secondary | ICD-10-CM | POA: Diagnosis not present

## 2019-04-07 NOTE — Patient Instructions (Addendum)
Follow up as needed  I appreciate the  opportunity to care for you  Thank You   Latonga Ponder , MD  

## 2019-04-07 NOTE — Progress Notes (Signed)
Julie Rollins    [id number]    1946-03-19  Primary Care Physician:Avbuere, Julie Grief, MD  Referring Physician: Nolene Ebbs, MD [address] [place], [place] [address]  This service was provided via audio only telephone due to [place] 19 pandemic.  Patient location: Home Provider location: Office Used 2 patient identifiers to confirm the correct person. Explained the limitations in evaluation and management via telemedicine. Patient is aware of potential medical charges for this visit.  Patient consented to this virtual visit.  The persons participating in this telemedicine service were myself and the patient  Interactive audio and video telecommunications were attempted between this provider and patient, however failed, due to patient having technical difficulties OR patient did not have access to video capability. We continued and completed visit with audio only.   Chief complaint: Pancreas cancer HPI:  73 year old female recently hospitalized with obstructive jaundice, new diagnosis pancreatic head CA.  ERCP  CBD stent placement. She is overwhelmed with all the doctors appointments, has follow-up with oncology. Her brother recently passed away and she is trying to deal with that as well. Her appetite is down and has generalized abdominal discomfort.  Denies any new complaints, is planning to get started on treatment.   Outpatient Encounter Medications as of 04/07/2019  Medication Sig  . acetaminophen (TYLENOL) 325 MG tablet Take 2 tablets (650 mg total) by mouth every 8 (eight) hours.  . citalopram (CELEXA) 20 MG tablet Take 20 mg by mouth at bedtime.  Marland Kitchen diltiazem (CARDIZEM CD) 180 MG 24 hr capsule Take 180 mg by mouth daily.  Marland Kitchen levothyroxine (SYNTHROID, LEVOTHROID) 50 MCG tablet Take 50 mcg by mouth every morning.  . lidocaine-prilocaine (EMLA) cream Apply to affected area once  . ondansetron (ZOFRAN) 8 MG tablet Take 1 tablet (8 mg total) by mouth 2 (two)  times daily as needed for refractory nausea / vomiting. Start on day 3 after chemotherapy.  . pantoprazole (PROTONIX) 20 MG tablet Take 1 tablet (20 mg total) by mouth 2 (two) times daily before a meal.  . prochlorperazine (COMPAZINE) 10 MG tablet Take 1 tablet (10 mg total) by mouth every 6 (six) hours as needed (NAUSEA).  . [DISCONTINUED] methocarbamol (ROBAXIN) 500 MG tablet Take 1 tablet (500 mg total) by mouth every 8 (eight) hours as needed for muscle spasms.  . [DISCONTINUED] polyethylene glycol (MIRALAX / GLYCOLAX) packet Take 17 g by mouth daily.   No facility-administered encounter medications on file as of 04/07/2019.     Allergies as of 04/07/2019 - Review Complete 04/07/2019  Allergen Reaction Noted  . Codeine Nausea Only 02/12/2012    Past Medical History:  Diagnosis Date  . Anxiety   . Blood transfusion without reported diagnosis   . Breast CA ([place])    s/p lumpectomy and radiation 2000  . Breast cancer ([place])   . Colitis    2007  . Hernia, epigastric   . HTN (hypertension)    "mild hypertension", was on BP med years ago but it caused orthostatic hypotension and she has not been medicated since  . Hypothyroid   . Personal history of radiation therapy 2001  . Vertigo   . Vertigo     Past Surgical History:  Procedure Laterality Date  . ABDOMINAL HYSTERECTOMY     13yrs ago  . ANTERIOR CERVICAL DECOMP/DISCECTOMY FUSION N/A 08/02/2018   Procedure: ANTERIOR CERVICAL DECOMPRESSION/DISCECTOMY FUSION CERVICAL FIVE- CERVICAL SIX;  Surgeon: Earnie Larsson, MD;  Location: [facility]  OR;  Service: Neurosurgery;  Laterality: N/A;  ANTERIOR CERVICAL DECOMPRESSION/DISCECTOMY FUSION CERVICAL FIVE- CERVICAL SIX  . BILIARY BRUSHING  03/29/2019   Procedure: BILIARY BRUSHING;  Surgeon: [name], MD;  Location: [facility] ENDOSCOPY;  Service: Endoscopy;;  . BILIARY STENT PLACEMENT  03/29/2019   Procedure: BILIARY STENT PLACEMENT;  Surgeon: [name], MD;  Location: [facility] ENDOSCOPY;  Service:  Endoscopy;;  . BIOPSY  03/29/2019   Procedure: BIOPSY;  Surgeon: [name], MD;  Location[facility] ENDOSCOPY;  Service: Endoscopy;;  . BREAST LUMPECTOMY     2000  . ERCP N/A 03/29/2019   Procedure: ENDOSCOPIC RETROGRADE CHOLANGIOPANCREATOGRAPHY (ERCP);  Surgeon: [name], MD;  Location: [facility] ENDOSCOPY;  Service: Endoscopy;  Laterality: N/A;  . ESOPHAGOGASTRODUODENOSCOPY (EGD) WITH PROPOFOL N/A 03/29/2019   Procedure: ESOPHAGOGASTRODUODENOSCOPY (EGD) WITH PROPOFOL;  Surgeon: [name], MD;  Location: [facility] ENDOSCOPY;  Service: Endoscopy;  Laterality: N/A;  . EUS N/A 03/29/2019   Procedure: UPPER ENDOSCOPIC ULTRASOUND (EUS) LINEAR;  Surgeon: [name], MD;  Location: [facility] ENDOSCOPY;  Service: Endoscopy;  Laterality: N/A;  . FINE NEEDLE ASPIRATION  03/29/2019   Procedure: FINE NEEDLE ASPIRATION (FNA);  Surgeon: [name], MD;  Location: [facility] ENDOSCOPY;  Service: Endoscopy;;  . HERNIA REPAIR     20+ years ago  . RETINAL DETACHMENT SURGERY    . SPHINCTEROTOMY  03/29/2019   Procedure: SPHINCTEROTOMY;  Surgeon: [name], MD;  Location: [facility] ENDOSCOPY;  Service: Endoscopy;;    Family History  Problem Relation Age of Onset  . Bladder Cancer Brother   . Other Other        Denies family h/o cardiac disease    Social History   Socioeconomic History  . Marital status: Divorced    Spouse name: Not on file  . Number of children: Not on file  . Years of education: Not on file  . Highest education level: Not on file  Occupational History  . Occupation: retired    Comment: [employer]   Social Needs  . Financial resource strain: Not on file  . Food insecurity:    Worry: Not on file    Inability: Not on file  . Transportation needs:    Medical: Not on file    Non-medical: Not on file  Tobacco Use  . Smoking status: Never Smoker  . Smokeless tobacco: Never Used  Substance and Sexual Activity  . Alcohol use: No  . Drug use: No  . Sexual activity: Not Currently   Lifestyle  . Physical activity:    Days per week: Not on file    Minutes per session: Not on file  . Stress: Not on file  Relationships  . Social connections:    Talks on phone: Not on file    Gets together: Not on file    Attends religious service: Not on file    Active member of club or organization: Not on file    Attends meetings of clubs or organizations: Not on file    Relationship status: Not on file  . Intimate partner violence:    Fear of current or ex partner: Not on file    Emotionally abused: Not on file    Physically abused: Not on file    Forced sexual activity: Not on file  Other Topics Concern  . Not on file  Social History Narrative   Lives alone      Review of systems: Review of Systems as per HPI All other systems  reviewed and are negative.   Physical Exam: Vitals were not taken and physical exam was not performed during this virtual visit.  Data Reviewed:  Reviewed labs, radiology imaging, old records and pertinent past GI work up   Assessment and Plan/Recommendations:  73 year old female with new diagnosis of pancreatic adenocarcinoma, getting started on chemotherapy.  Status post ERCP with CBD stent placement No distant metastasis.  Plan for resection with neoadjuvant chemotherapy Follow-up with oncology GI follow-up as needed   K. Denzil Magnuson , MD   CC: Julie Ebbs, MD

## 2019-04-07 NOTE — Telephone Encounter (Signed)
Scheduled webex Gen counseling per sch msg.

## 2019-04-08 ENCOUNTER — Other Ambulatory Visit: Payer: Medicare HMO

## 2019-04-08 ENCOUNTER — Other Ambulatory Visit: Payer: Self-pay

## 2019-04-08 ENCOUNTER — Ambulatory Visit (HOSPITAL_COMMUNITY)
Admission: RE | Admit: 2019-04-08 | Discharge: 2019-04-08 | Disposition: A | Discharge status: Home / Self Care | Payer: Medicare HMO | Source: Ambulatory Visit | Attending: Nurse Practitioner | Admitting: Nurse Practitioner

## 2019-04-08 ENCOUNTER — Other Ambulatory Visit (HOSPITAL_COMMUNITY)
Admission: RE | Admit: 2019-04-08 | Discharge: 2019-04-08 | Disposition: A | Discharge status: Home / Self Care | Payer: Medicare HMO | Source: Ambulatory Visit | Attending: General Surgery | Admitting: General Surgery

## 2019-04-08 ENCOUNTER — Inpatient Hospital Stay: Payer: Medicare HMO

## 2019-04-08 ENCOUNTER — Ambulatory Visit (HOSPITAL_COMMUNITY): Payer: Medicare HMO

## 2019-04-08 DIAGNOSIS — I700 Atherosclerosis of aorta: Secondary | ICD-10-CM | POA: Insufficient documentation

## 2019-04-08 DIAGNOSIS — C250 Malignant neoplasm of head of pancreas: Secondary | ICD-10-CM

## 2019-04-08 DIAGNOSIS — K8689 Other specified diseases of pancreas: Secondary | ICD-10-CM

## 2019-04-08 DIAGNOSIS — Z01818 Encounter for other preprocedural examination: Secondary | ICD-10-CM | POA: Diagnosis not present

## 2019-04-08 DIAGNOSIS — Z5111 Encounter for antineoplastic chemotherapy: Secondary | ICD-10-CM | POA: Diagnosis not present

## 2019-04-08 DIAGNOSIS — D490 Neoplasm of unspecified behavior of digestive system: Secondary | ICD-10-CM | POA: Insufficient documentation

## 2019-04-08 DIAGNOSIS — Z1159 Encounter for screening for other viral diseases: Secondary | ICD-10-CM | POA: Diagnosis not present

## 2019-04-08 LAB — CBC WITH DIFFERENTIAL (CANCER CENTER ONLY)
Abs Immature Granulocytes: 0.04 10*3/uL (ref 0.00–0.07)
Basophils Absolute: 0 10*3/uL (ref 0.0–0.1)
Basophils Relative: 1 %
Eosinophils Absolute: 0.2 10*3/uL (ref 0.0–0.5)
Eosinophils Relative: 2 %
HCT: 34.9 % — ABNORMAL LOW (ref 36.0–46.0)
Hemoglobin: 11.3 g/dL — ABNORMAL LOW (ref 12.0–15.0)
Immature Granulocytes: 1 %
Lymphocytes Relative: 12 %
Lymphs Abs: 0.8 10*3/uL (ref 0.7–4.0)
MCH: 30.8 pg (ref 26.0–34.0)
MCHC: 32.4 g/dL (ref 30.0–36.0)
MCV: 95.1 fL (ref 80.0–100.0)
Monocytes Absolute: 0.5 10*3/uL (ref 0.1–1.0)
Monocytes Relative: 8 %
Neutro Abs: 5.4 10*3/uL (ref 1.7–7.7)
Neutrophils Relative %: 76 %
Platelet Count: 329 10*3/uL (ref 150–400)
RBC: 3.67 MIL/uL — ABNORMAL LOW (ref 3.87–5.11)
RDW: 18.5 % — ABNORMAL HIGH (ref 11.5–15.5)
WBC Count: 6.9 10*3/uL (ref 4.0–10.5)
nRBC: 0 % (ref 0.0–0.2)

## 2019-04-08 LAB — CMP (CANCER CENTER ONLY)
ALT: 98 U/L — ABNORMAL HIGH (ref 0–44)
AST: 42 U/L — ABNORMAL HIGH (ref 15–41)
Albumin: 3.4 g/dL — ABNORMAL LOW (ref 3.5–5.0)
Alkaline Phosphatase: 440 U/L — ABNORMAL HIGH (ref 38–126)
Anion gap: 7 (ref 5–15)
BUN: 16 mg/dL (ref 8–23)
CO2: 28 mmol/L (ref 22–32)
Calcium: 9.4 mg/dL (ref 8.9–10.3)
Chloride: 104 mmol/L (ref 98–111)
Creatinine: 0.77 mg/dL (ref 0.44–1.00)
GFR, Est AFR Am: >60 mL/min (ref >60)
GFR, Estimated: >60 mL/min (ref >60)
Glucose, Bld: 116 mg/dL — ABNORMAL HIGH (ref 70–99)
Potassium: 4.9 mmol/L (ref 3.5–5.1)
Sodium: 139 mmol/L (ref 135–145)
Total Bilirubin: 3.9 mg/dL (ref 0.3–1.2)
Total Protein: 7.2 g/dL (ref 6.5–8.1)

## 2019-04-09 LAB — NOVEL CORONAVIRUS, NAA (HOSP ORDER, SEND-OUT TO REF LAB; TAT 18-24 HRS): SARS-CoV-2, NAA: NOT DETECTED

## 2019-04-09 LAB — CANCER ANTIGEN 19-9: CA 19-9: 977 U/mL — ABNORMAL HIGH (ref 0–35)
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
## 2019-04-12 ENCOUNTER — Encounter (HOSPITAL_BASED_OUTPATIENT_CLINIC_OR_DEPARTMENT_OTHER): Payer: Self-pay | Admitting: Certified Registered"

## 2019-04-12 ENCOUNTER — Encounter (HOSPITAL_BASED_OUTPATIENT_CLINIC_OR_DEPARTMENT_OTHER)
Admission: RE | Disposition: A | Discharge status: Home / Self Care | Payer: Self-pay | Source: Home / Self Care | Attending: General Surgery

## 2019-04-12 ENCOUNTER — Ambulatory Visit (HOSPITAL_COMMUNITY): Payer: Medicare HMO

## 2019-04-12 ENCOUNTER — Ambulatory Visit (HOSPITAL_BASED_OUTPATIENT_CLINIC_OR_DEPARTMENT_OTHER): Payer: Medicare HMO | Admitting: Certified Registered"

## 2019-04-12 ENCOUNTER — Other Ambulatory Visit: Payer: Self-pay

## 2019-04-12 ENCOUNTER — Ambulatory Visit (HOSPITAL_BASED_OUTPATIENT_CLINIC_OR_DEPARTMENT_OTHER)
Admission: RE | Admit: 2019-04-12 | Discharge: 2019-04-12 | Disposition: A | Discharge status: Home / Self Care | Payer: Medicare HMO | Attending: General Surgery | Admitting: General Surgery

## 2019-04-12 DIAGNOSIS — C250 Malignant neoplasm of head of pancreas: Secondary | ICD-10-CM | POA: Insufficient documentation

## 2019-04-12 DIAGNOSIS — Z923 Personal history of irradiation: Secondary | ICD-10-CM | POA: Insufficient documentation

## 2019-04-12 DIAGNOSIS — Z90722 Acquired absence of ovaries, bilateral: Secondary | ICD-10-CM | POA: Insufficient documentation

## 2019-04-12 DIAGNOSIS — F419 Anxiety disorder, unspecified: Secondary | ICD-10-CM | POA: Insufficient documentation

## 2019-04-12 DIAGNOSIS — Z90710 Acquired absence of both cervix and uterus: Secondary | ICD-10-CM | POA: Diagnosis not present

## 2019-04-12 DIAGNOSIS — Z419 Encounter for procedure for purposes other than remedying health state, unspecified: Secondary | ICD-10-CM

## 2019-04-12 DIAGNOSIS — Z809 Family history of malignant neoplasm, unspecified: Secondary | ICD-10-CM | POA: Insufficient documentation

## 2019-04-12 DIAGNOSIS — Z79890 Hormone replacement therapy (postmenopausal): Secondary | ICD-10-CM | POA: Insufficient documentation

## 2019-04-12 DIAGNOSIS — Z79899 Other long term (current) drug therapy: Secondary | ICD-10-CM | POA: Diagnosis not present

## 2019-04-12 DIAGNOSIS — Z95828 Presence of other vascular implants and grafts: Secondary | ICD-10-CM

## 2019-04-12 DIAGNOSIS — Z853 Personal history of malignant neoplasm of breast: Secondary | ICD-10-CM | POA: Insufficient documentation

## 2019-04-12 DIAGNOSIS — E079 Disorder of thyroid, unspecified: Secondary | ICD-10-CM | POA: Diagnosis not present

## 2019-04-12 HISTORY — PX: PORTACATH PLACEMENT: SHX2246

## 2019-04-12 SURGERY — INSERTION, TUNNELED CENTRAL VENOUS DEVICE, WITH PORT
Anesthesia: General

## 2019-04-12 MED ORDER — ACETAMINOPHEN 500 MG PO TABS
1000.0000 mg | ORAL_TABLET | ORAL | Status: AC
  Administered 2019-04-12: 1000 mg via ORAL

## 2019-04-12 MED ORDER — MIDAZOLAM HCL 2 MG/2ML IJ SOLN: 1.0000 mg | INTRAMUSCULAR | Status: DC | PRN

## 2019-04-12 MED ORDER — GABAPENTIN 300 MG PO CAPS
ORAL_CAPSULE | ORAL | Status: AC
  Filled 2019-04-12: qty 1

## 2019-04-12 MED ORDER — MEPERIDINE HCL 25 MG/ML IJ SOLN: 6.2500 mg | INTRAMUSCULAR | Status: DC | PRN

## 2019-04-12 MED ORDER — HEPARIN SOD (PORK) LOCK FLUSH 100 UNIT/ML IV SOLN
INTRAVENOUS | Status: AC
  Filled 2019-04-12: qty 5

## 2019-04-12 MED ORDER — CEFAZOLIN SODIUM-DEXTROSE 2-4 GM/100ML-% IV SOLN
2.0000 g | INTRAVENOUS | Status: AC
  Administered 2019-04-12: 2 g via INTRAVENOUS

## 2019-04-12 MED ORDER — BUPIVACAINE HCL (PF) 0.25 % IJ SOLN
INTRAMUSCULAR | Status: AC
  Filled 2019-04-12: qty 30

## 2019-04-12 MED ORDER — PROPOFOL 10 MG/ML IV BOLUS
INTRAVENOUS | Status: DC | PRN
  Administered 2019-04-12: 120 mg via INTRAVENOUS

## 2019-04-12 MED ORDER — OXYCODONE HCL 5 MG/5ML PO SOLN: 5.0000 mg | Freq: Once | ORAL | Status: DC | PRN

## 2019-04-12 MED ORDER — LIDOCAINE 2% (20 MG/ML) 5 ML SYRINGE
INTRAMUSCULAR | Status: DC | PRN
  Administered 2019-04-12: 100 mg via INTRAVENOUS

## 2019-04-12 MED ORDER — LIDOCAINE-EPINEPHRINE (PF) 1 %-1:200000 IJ SOLN
INTRAMUSCULAR | Status: AC
  Filled 2019-04-12: qty 30

## 2019-04-12 MED ORDER — HEPARIN (PORCINE) IN NACL 2-0.9 UNITS/ML
INTRAMUSCULAR | Status: AC | PRN
  Administered 2019-04-12: 1

## 2019-04-12 MED ORDER — ACETAMINOPHEN 500 MG PO TABS
ORAL_TABLET | ORAL | Status: AC
  Filled 2019-04-12: qty 2

## 2019-04-12 MED ORDER — FENTANYL CITRATE (PF) 100 MCG/2ML IJ SOLN: 25.0000 ug | INTRAMUSCULAR | Status: DC | PRN

## 2019-04-12 MED ORDER — CHLORHEXIDINE GLUCONATE CLOTH 2 % EX PADS: 6.0000 | MEDICATED_PAD | Freq: Once | CUTANEOUS | Status: DC

## 2019-04-12 MED ORDER — CEFAZOLIN SODIUM-DEXTROSE 2-4 GM/100ML-% IV SOLN
INTRAVENOUS | Status: AC
  Filled 2019-04-12: qty 100

## 2019-04-12 MED ORDER — DEXAMETHASONE SODIUM PHOSPHATE 4 MG/ML IJ SOLN
INTRAMUSCULAR | Status: DC | PRN
  Administered 2019-04-12: 10 mg via INTRAVENOUS

## 2019-04-12 MED ORDER — LACTATED RINGERS IV SOLN
INTRAVENOUS | Status: DC
  Administered 2019-04-12: 13:00:00 via INTRAVENOUS

## 2019-04-12 MED ORDER — FENTANYL CITRATE (PF) 100 MCG/2ML IJ SOLN
INTRAMUSCULAR | Status: AC
  Filled 2019-04-12: qty 2

## 2019-04-12 MED ORDER — HYDROCODONE-ACETAMINOPHEN 5-325 MG PO TABS: 0.5000 | ORAL_TABLET | Freq: Four times a day (QID) | ORAL | 0 refills | Status: DC | PRN

## 2019-04-12 MED ORDER — HEPARIN (PORCINE) IN NACL 1000-0.9 UT/500ML-% IV SOLN
INTRAVENOUS | Status: AC
  Filled 2019-04-12: qty 500

## 2019-04-12 MED ORDER — ACETAMINOPHEN 160 MG/5ML PO SOLN: 325.0000 mg | ORAL | Status: DC | PRN

## 2019-04-12 MED ORDER — LIDOCAINE-EPINEPHRINE (PF) 1 %-1:200000 IJ SOLN
INTRAMUSCULAR | Status: DC | PRN
  Administered 2019-04-12: 5 mL

## 2019-04-12 MED ORDER — FENTANYL CITRATE (PF) 100 MCG/2ML IJ SOLN
50.0000 ug | INTRAMUSCULAR | Status: DC | PRN
  Administered 2019-04-12: 15:00:00 50 ug via INTRAVENOUS

## 2019-04-12 MED ORDER — OXYCODONE HCL 5 MG PO TABS: 5.0000 mg | ORAL_TABLET | Freq: Once | ORAL | Status: DC | PRN

## 2019-04-12 MED ORDER — BUPIVACAINE HCL (PF) 0.25 % IJ SOLN
INTRAMUSCULAR | Status: DC | PRN
  Administered 2019-04-12: 5 mL

## 2019-04-12 MED ORDER — GABAPENTIN 300 MG PO CAPS
300.0000 mg | ORAL_CAPSULE | ORAL | Status: AC
  Administered 2019-04-12: 13:00:00 300 mg via ORAL

## 2019-04-12 MED ORDER — SCOPOLAMINE 1 MG/3DAYS TD PT72: 1.0000 | MEDICATED_PATCH | Freq: Once | TRANSDERMAL | Status: DC | PRN

## 2019-04-12 MED ORDER — HEPARIN SOD (PORK) LOCK FLUSH 100 UNIT/ML IV SOLN
INTRAVENOUS | Status: DC | PRN
  Administered 2019-04-12: 500 [IU] via INTRAVENOUS

## 2019-04-12 MED ORDER — BUPIVACAINE-EPINEPHRINE (PF) 0.5% -1:200000 IJ SOLN
INTRAMUSCULAR | Status: AC
  Filled 2019-04-12: qty 30

## 2019-04-12 MED ORDER — ONDANSETRON HCL 4 MG/2ML IJ SOLN
4.0000 mg | Freq: Once | INTRAMUSCULAR | Status: AC | PRN
  Administered 2019-04-12: 4 mg via INTRAVENOUS

## 2019-04-12 MED ORDER — ACETAMINOPHEN 325 MG PO TABS: 325.0000 mg | ORAL_TABLET | ORAL | Status: DC | PRN

## 2019-04-12 SURGICAL SUPPLY — 43 items
ADH SKN CLS APL DERMABOND .7 (GAUZE/BANDAGES/DRESSINGS) ×1
APL PRP STRL LF DISP 70% ISPRP (MISCELLANEOUS) ×1
BAG DECANTER FOR FLEXI CONT (MISCELLANEOUS) ×3 IMPLANT
BLADE HEX COATED 2.75 (ELECTRODE) ×3 IMPLANT
BLADE SURG 11 STRL SS (BLADE) ×3 IMPLANT
BLADE SURG 15 STRL LF DISP TIS (BLADE) ×1 IMPLANT
BLADE SURG 15 STRL SS (BLADE) ×3
CHLORAPREP W/TINT 26 (MISCELLANEOUS) ×3 IMPLANT
COVER BACK TABLE REUSABLE LG (DRAPES) ×3 IMPLANT
COVER MAYO STAND REUSABLE (DRAPES) ×3 IMPLANT
COVER WAND RF STERILE (DRAPES) IMPLANT
DECANTER SPIKE VIAL GLASS SM (MISCELLANEOUS) IMPLANT
DERMABOND ADVANCED (GAUZE/BANDAGES/DRESSINGS) ×2
DERMABOND ADVANCED .7 DNX12 (GAUZE/BANDAGES/DRESSINGS) ×1 IMPLANT
DRAPE C-ARM 42X72 X-RAY (DRAPES) ×3 IMPLANT
DRAPE LAPAROTOMY TRNSV 102X78 (DRAPE) ×3 IMPLANT
DRAPE UTILITY XL STRL (DRAPES) ×3 IMPLANT
DRSG TEGADERM 4X4.75 (GAUZE/BANDAGES/DRESSINGS) ×2 IMPLANT
ELECT REM PT RETURN 9FT ADLT (ELECTROSURGICAL) ×3
ELECTRODE REM PT RTRN 9FT ADLT (ELECTROSURGICAL) ×1 IMPLANT
GAUZE SPONGE 4X4 12PLY STRL LF (GAUZE/BANDAGES/DRESSINGS) IMPLANT
GLOVE BIO SURGEON STRL SZ 6 (GLOVE) ×3 IMPLANT
GLOVE BIOGEL PI IND STRL 6.5 (GLOVE) ×1 IMPLANT
GLOVE BIOGEL PI INDICATOR 6.5 (GLOVE) ×2
GOWN STRL REUS W/ TWL LRG LVL3 (GOWN DISPOSABLE) ×1 IMPLANT
GOWN STRL REUS W/TWL 2XL LVL3 (GOWN DISPOSABLE) ×3 IMPLANT
GOWN STRL REUS W/TWL LRG LVL3 (GOWN DISPOSABLE) ×3
IV CONNECTOR ONE LINK NDLESS (IV SETS) ×4 IMPLANT
KIT PORT POWER 8FR ISP CVUE (Port) ×2 IMPLANT
NDL HYPO 25X1 1.5 SAFETY (NEEDLE) ×1 IMPLANT
NEEDLE HYPO 25X1 1.5 SAFETY (NEEDLE) ×3 IMPLANT
PACK BASIN DAY SURGERY FS (CUSTOM PROCEDURE TRAY) ×3 IMPLANT
PENCIL BUTTON HOLSTER BLD 10FT (ELECTRODE) ×3 IMPLANT
SLEEVE SCD COMPRESS KNEE MED (MISCELLANEOUS) ×3 IMPLANT
SUT MNCRL AB 4-0 PS2 18 (SUTURE) ×3 IMPLANT
SUT PROLENE 2 0 SH DA (SUTURE) ×6 IMPLANT
SUT VIC AB 3-0 SH 27 (SUTURE) ×3
SUT VIC AB 3-0 SH 27X BRD (SUTURE) ×1 IMPLANT
SUT VICRYL 3-0 CR8 SH (SUTURE) IMPLANT
SYR 10ML LL (SYRINGE) ×3 IMPLANT
SYR 5ML LUER SLIP (SYRINGE) ×3 IMPLANT
SYR CONTROL 10ML LL (SYRINGE) ×3 IMPLANT
TOWEL GREEN STERILE FF (TOWEL DISPOSABLE) ×3 IMPLANT

## 2019-04-12 NOTE — Anesthesia Postprocedure Evaluation (Signed)
Anesthesia Post Note  Patient: Julie Rollins  Procedure(s) Performed: INSERTION PORT-A-CATH LEFT SUBCLAVIAN (N/A )     Anesthesia Post Evaluation  Last Vitals:  Vitals:   04/12/19 1630 04/12/19 1645  BP: (!) 127/57 (!) 130/51  Pulse: 70 71  Resp: 14 17  Temp:  36.8 C  SpO2: 99% 99%    Last Pain:  Vitals:   04/12/19 1645  TempSrc:   PainSc: 0-No pain                 Yarelis Ambrosino

## 2019-04-12 NOTE — Progress Notes (Addendum)
[place]   [phone] [phone]   Clinic Follow up Note   Patient Care Team: Nolene Ebbs, MD as PCP - General (Internal Medicine) Stark Klein, MD as Consulting Physician (General Surgery) Truitt Merle, MD as Consulting Physician (Hematology) Alla Feeling, NP as Nurse Practitioner (Nurse Practitioner) Arna Snipe, RN as Oncology Nurse Navigator 04/13/2019  CHIEF COMPLAINT: f/u pancreatic cancer  SUMMARY OF ONCOLOGIC HISTORY:   Malignant neoplasm of head of pancreas ([place])   03/28/2019 Imaging    CT AP w contrast IMPRESSION: Interval development of 20 x 14 mm ill-defined low density in pancreatic head concerning for possible neoplasm or malignancy. This appears to be resulting in severe intrahepatic and extrahepatic biliary dilatation. ERCP is recommended for further evaluation.  Sigmoid diverticulosis without inflammation.    03/29/2019 Initial Biopsy    Diagnosis FINE NEEDLE ASPIRATION, ENDOSCOPIC EGUS, PANCREATIC HEAD MASS (SPECIMEN 1 OF 2 COLLECTED 03/29/2019) MALIGNANT CELLS CONSISTENT WITH ADENOCARCINOMA.    03/29/2019 Initial Biopsy    Diagnosis Duodenum, Biopsy, abnormal mucosa at major papilla - ADENOCARCINOMA. SEE NOTE Diagnosis Note The duodenal mucosa is negative for dysplasia. The findings are consistent with duodenal involvement from patient's suspected primary pancreatic adenocarcinoma. Dr. Melina Copa has reviewed this case and concurs with the above interpretation. Dr. Ardis Hughs was notified on 03/30/2019.    03/29/2019 Pathology Results    Diagnosis BILE DUCT BRUSHING (SPECIMEN 2 OF 2, COLLECTED ON 03/29/2019): HIGHLY SUSPICIOUS FOR MALIGNANCY.    03/29/2019 Procedure    ERCP with stenting of malignant distal CBD stricture per Dr. Ardis Hughs    04/04/2019 Initial Diagnosis    Malignant neoplasm of head of pancreas ([place])    04/08/2019 Imaging    CT Chest IMPRESSION: No evidence of metastatic disease in the chest. Aortic  Atherosclerosis (ICD10-I70.0).     04/13/2019 -  Chemotherapy    The patient had palonosetron (ALOXI) injection 0.25 mg, 0.25 mg, Intravenous,  Once, 1 of 4 cycles irinotecan (CAMPTOSAR) 140 mg in dextrose 5 % 500 mL chemo infusion, 80 mg/m2 = 140 mg (100 % of original dose 80 mg/m2), Intravenous,  Once, 1 of 4 cycles Dose modification: 160 mg/m2 (original dose 80 mg/m2, Cycle 1, Reason: Provider Judgment), 80 mg/m2 (original dose 80 mg/m2, Cycle 1, Reason: Provider Judgment) leucovorin 664 mg in dextrose 5 % 250 mL infusion, 400 mg/m2 = 664 mg, Intravenous,  Once, 1 of 4 cycles oxaliplatin (ELOXATIN) 115 mg in dextrose 5 % 500 mL chemo infusion, 70 mg/m2 = 115 mg (100 % of original dose 70 mg/m2), Intravenous,  Once, 1 of 4 cycles Dose modification: 70 mg/m2 (original dose 70 mg/m2, Cycle 1, Reason: Provider Judgment, Comment: advanced age and limited social support ) fluorouracil (ADRUCIL) 4,000 mg in sodium chloride 0.9 % 70 mL chemo infusion, 2,400 mg/m2 = 4,000 mg, Intravenous, 1 Day/Dose, 1 of 4 cycles  for chemotherapy treatment.      CURRENT THERAPY: neoadjuvant FOLFIRINOX to begin 04/13/19   INTERVAL HISTORY: Ms. Lenart returns for f/u and to start treatment as scheduled. She completed staging with CT chest this week. She attended virtual chemo class. She underwent PAC placement on 5/19 per Dr. Barry Dienes. She has mild soreness at the port site, no bruising or swelling. She feels well otherwise. Eating and drinking well. Bowel movements are normal, usually one daily. No blood in stool. Denies abdominal pain. She noticed mild swelling and intermittent soreness in her left lower leg few days ago. She has not had swelling before. Had knee  injury previously. Denies cough, chest pain, dyspnea. Denies fever or chills. Denies n/v/c/d or abdominal pain.     MEDICAL HISTORY:  Past Medical History:  Diagnosis Date  . Anxiety   . Blood transfusion without reported diagnosis   . Breast CA ([place])     s/p lumpectomy and radiation 2000  . Breast cancer ([place])   . Colitis    2007  . Hernia, epigastric   . HTN (hypertension)    "mild hypertension", was on BP med years ago but it caused orthostatic hypotension and she has not been medicated since  . Hypothyroid   . Personal history of radiation therapy 2001  . Vertigo   . Vertigo     SURGICAL HISTORY: Past Surgical History:  Procedure Laterality Date  . ABDOMINAL HYSTERECTOMY     64yrs ago  . ANTERIOR CERVICAL DECOMP/DISCECTOMY FUSION N/A 08/02/2018   Procedure: ANTERIOR CERVICAL DECOMPRESSION/DISCECTOMY FUSION CERVICAL FIVE- CERVICAL SIX;  Surgeon: Earnie Larsson, MD;  Location: [facility];  Service: Neurosurgery;  Laterality: N/A;  ANTERIOR CERVICAL DECOMPRESSION/DISCECTOMY FUSION CERVICAL FIVE- CERVICAL SIX  . BILIARY BRUSHING  03/29/2019   Procedure: BILIARY BRUSHING;  Surgeon: [name], MD;  Location: [facility] ENDOSCOPY;  Service: Endoscopy;;  . BILIARY STENT PLACEMENT  03/29/2019   Procedure: BILIARY STENT PLACEMENT;  Surgeon: [name], MD;  Location: [facility] ENDOSCOPY;  Service: Endoscopy;;  . BIOPSY  03/29/2019   Procedure: BIOPSY;  Surgeon: [name], MD;  Location: [facility] ENDOSCOPY;  Service: Endoscopy;;  . BREAST LUMPECTOMY     2000  . ERCP N/A 03/29/2019   Procedure: ENDOSCOPIC RETROGRADE CHOLANGIOPANCREATOGRAPHY (ERCP);  Surgeon: [name], MD;  Location: [facility] ENDOSCOPY;  Service: Endoscopy;  Laterality: N/A;  . ESOPHAGOGASTRODUODENOSCOPY (EGD) WITH PROPOFOL N/A 03/29/2019   Procedure: ESOPHAGOGASTRODUODENOSCOPY (EGD) WITH PROPOFOL;  Surgeon: [name], MD;  Location: [facility] ENDOSCOPY;  Service: Endoscopy;  Laterality: N/A;  . EUS N/A 03/29/2019   Procedure: UPPER ENDOSCOPIC ULTRASOUND (EUS) LINEAR;  Surgeon: [name], MD;  Location: [facility] ENDOSCOPY;  Service: Endoscopy;  Laterality: N/A;  . FINE NEEDLE ASPIRATION  03/29/2019   Procedure: FINE NEEDLE ASPIRATION (FNA);  Surgeon: [name], MD;  Location: [facility] ENDOSCOPY;   Service: Endoscopy;;  . HERNIA REPAIR     20+ years ago  . PORTACATH PLACEMENT N/A 04/12/2019   Procedure: INSERTION PORT-A-CATH LEFT SUBCLAVIAN;  Surgeon: Stark Klein, MD;  Location: [place];  Service: General;  Laterality: N/A;  . RETINAL DETACHMENT SURGERY    . SPHINCTEROTOMY  03/29/2019   Procedure: SPHINCTEROTOMY;  Surgeon: [name], MD;  Location:  [facility] ENDOSCOPY;  Service: Endoscopy;;    I have reviewed the social history and family history with the patient and they are unchanged from previous note.  ALLERGIES:  is allergic to codeine.  MEDICATIONS:  Current Outpatient Medications  Medication Sig Dispense Refill  . acetaminophen (TYLENOL) 325 MG tablet Take 2 tablets (650 mg total) by mouth every 8 (eight) hours.    . citalopram (CELEXA) 20 MG tablet Take 20 mg by mouth at bedtime.    Marland Kitchen diltiazem (CARDIZEM CD) 180 MG 24 hr capsule Take 180 mg by mouth daily.    Marland Kitchen levothyroxine (SYNTHROID, LEVOTHROID) 50 MCG tablet Take 50 mcg by mouth every morning.    . lidocaine-prilocaine (EMLA) cream Apply to affected area once 30 g 3  . pantoprazole (PROTONIX) 20 MG tablet Take 1 tablet (20 mg total) by mouth 2 (two) times daily before a meal. 60  tablet 2  . HYDROcodone-acetaminophen (NORCO/VICODIN) 5-325 MG tablet Take 0.5-1 tablets by mouth every 6 (six) hours as needed for moderate pain or severe pain. (Patient not taking: Reported on 04/13/2019) 8 tablet 0  . ondansetron (ZOFRAN) 8 MG tablet Take 1 tablet (8 mg total) by mouth 2 (two) times daily as needed for refractory nausea / vomiting. Start on day 3 after chemotherapy. (Patient not taking: Reported on 04/13/2019) 30 tablet 1  . prochlorperazine (COMPAZINE) 10 MG tablet Take 1 tablet (10 mg total) by mouth every 6 (six) hours as needed (NAUSEA). (Patient not taking: Reported on 04/13/2019) 30 tablet 1   No current facility-administered medications for this visit.    Facility-Administered Medications Ordered in  Other Visits  Medication Dose Route Frequency Provider Last Rate Last Dose  . fluorouracil (ADRUCIL) 4,000 mg in sodium chloride 0.9 % 70 mL chemo infusion  2,400 mg/m2 (Treatment Plan Recorded) Intravenous 1 day or 1 dose Truitt Merle, MD      . irinotecan (CAMPTOSAR) 140 mg in dextrose 5 % 500 mL chemo infusion  80 mg/m2 (Treatment Plan Recorded) Intravenous Once Truitt Merle, MD      . leucovorin 664 mg in dextrose 5 % 250 mL infusion  400 mg/m2 (Treatment Plan Recorded) Intravenous Once Truitt Merle, MD      . oxaliplatin (ELOXATIN) 115 mg in dextrose 5 % 500 mL chemo infusion  70 mg/m2 (Treatment Plan Recorded) Intravenous Once Truitt Merle, MD        PHYSICAL EXAMINATION: ECOG PERFORMANCE STATUS: 0 - Asymptomatic  Vitals:   04/13/19 1140  BP: 133/67  Pulse: 82  Resp: 18  Temp: 98.5 F (36.9 C)  SpO2: 95%   There were no vitals filed for this visit.  GENERAL:alert, no distress and comfortable SKIN:  no rashes. Mild juandice  EYES: scleral icterus  LUNGS: respirations even and unlabored HEART: mild bilateral lower extremity edema, L>R. No calf tenderness  NEURO: alert & oriented x 3 with fluent speech, no focal motor/sensory deficits PAC healing well, no erythema or swelling  Limited exam for COVID19 outbreak  LABORATORY DATA:  I have reviewed the data as listed CBC Latest Ref Rng & Units 04/13/2019 04/08/2019 03/30/2019  WBC 4.0 - 10.5 K/uL 12.7(H) 6.9 11.2(H)  Hemoglobin 12.0 - 15.0 g/dL 10.9(L) 11.3(L) 9.9(L)  Hematocrit 36.0 - 46.0 % 32.7(L) 34.9(L) 29.0(L)  Platelets 150 - 400 K/uL 327 329 306     CMP Latest Ref Rng & Units 04/13/2019 04/08/2019 03/30/2019  Glucose 70 - 99 mg/dL 165(H) 116(H) 132(H)  BUN 8 - 23 mg/dL 15 16 12   Creatinine 0.44 - 1.00 mg/dL 0.72 0.77 0.58  Sodium 135 - 145 mmol/L 139 139 136  Potassium 3.5 - 5.1 mmol/L 3.5 4.9 4.6  Chloride 98 - 111 mmol/L 104 104 103  CO2 22 - 32 mmol/L 25 28 23   Calcium 8.9 - 10.3 mg/dL 9.2 9.4 9.0  Total Protein 6.5 - 8.1 g/dL  6.9 7.2 5.8(L)  Total Bilirubin 0.3 - 1.2 mg/dL 2.7(H) 3.9(HH) 8.3(H)  Alkaline Phos 38 - 126 U/L 291(H) 440(H) 951(H)  AST 15 - 41 U/L 28 42(H) 135(H)  ALT 0 - 44 U/L 49(H) 98(H) 243(H)      RADIOGRAPHIC STUDIES: I have personally reviewed the radiological images as listed and agreed with the findings in the report. Dg Chest Port 1 View  Result Date: 04/12/2019 CLINICAL DATA:  Port-A-Cath insertion EXAM: PORTABLE CHEST 1 VIEW COMPARISON:  Chest x-ray dated 07/31/2018 FINDINGS: There  is a left subclavian Port-A-Cath in place with tip projecting over the mid SVC. No evidence of a left-sided pneumothorax. The lungs are clear. The cardiac silhouette is stable. There is pleuroparenchymal scarring at the lung apices. IMPRESSION: Left subclavian Port-A-Cath as above with no evidence of a pneumothorax. Electronically Signed   By: Constance Holster M.D.   On: 04/12/2019 16:39   Dg Fluoro Guide Cv Line-no Report  Result Date: 04/12/2019 Fluoroscopy was utilized by the requesting physician.  No radiographic interpretation.     ASSESSMENT & PLAN: 73 yo female with   1. Adenocarcinoma of pancreatic head, cT2N0M0, with duodenal involvement  -diagnosed 03/2019, presented with painless juandice s/p ERCP with stenting (bili 23.9).  -She completed staging workup with CT chest which was negative for metastatic disease, I reviewed this with her.  -She attended chemo class. She has not picked up anti-emetics but has a friend who can help her do this today. We again reviewed potential side effects and symptom management.  -She has limited social support. Her brother unfortunately died the day of her initial consult last week. She appears to be coping well, she was able to speak to him before he passed away.  -Today Ms. Register appears well. Labs reviewed. Bili and transaminases are improved. CBC is stable. Baseline CA 19-9 is elevated tp 977, will follow trend -She will begin neoadjuvant FOLFIRINOX today with  dose-reduction.  -Plan to f/u in 1 week for toxicity check. Given her transportation issues, may be done via phone visit if needed.  -will f/u with each treatment   2. 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 Ardis Hughs on 03/29/19  -juandice is improving, bili 2.7 today   3. Lower leg edema  -she noticed swollen legs a few days ago, and notes intermittent discomfort in left calf -give her underlying pancreatic cancer and high risk for thrombosis will obtain doppler study to r/o DVT. -Doppler scheduled on 5/22 after pump d/c  4. Esophagitis with bleeding- acid related  -found on EGD 03/29/19 per Dr. Ardis Hughs - started on PPI pantoprazole 20 mg BID   5. Family history  -due to her personal history of breast cancer (DCIS) and pancreas cancer, she is a candidate for genetic testing. She was referred by Dr. Barry Dienes.  -she does not have children. She does have a brother who had bladder cancer -appt on 6/11 via webex  6. H/o left breast DCIS -diagnosed in 2000, s/p lumpectomy and adjuvant radiation, managed by Dr. Margot Chimes -last mammo in 01/2018 negative for evidence of malignancy   7. Hypothyroidism  -on Synthroid per PCP   PLAN:  -Labs, CT reviewed -Proceed with cycle 1 FOLFIRINOX today, dose reduced  -US doppler of LE for L>R swelling on 5/22 -F/u next week for toxicity check  -F/u with cycle 2 in 2 weeks   All questions were answered. The patient knows to call the clinic with any problems, questions or concerns. No barriers to learning was detected. I spent 20 minutes counseling the patient face to face. The total time spent in the appointment was 25 minutes and more than 50% was on counseling and review of test results.     Alla Feeling, NP 04/13/19   Addendum: During irinotecan and leucovorin infusion patient experienced headache 1 or 2/10 pain and blurry vision. She was  assessed in the infusion area.  No slurred speech, dizziness, weakness. She  has h/o headaches. She received aloxi and decadron premeds, headache could be related to aloxi. We reviewed her meds. VSS. Monitored by nursing staff. Treated her with tylenol. Instructed to notify provider with new or worsening concerns. She verbalizes understanding.

## 2019-04-12 NOTE — Discharge Instructions (Addendum)
[place] Office Phone Number [phone]   POST OP INSTRUCTIONS  Always review your discharge instruction sheet given to you by the facility where your surgery was performed.  IF YOU HAVE DISABILITY OR FAMILY LEAVE FORMS, YOU MUST BRING THEM TO THE OFFICE FOR PROCESSING.  DO NOT GIVE THEM TO YOUR DOCTOR.  1. A prescription for pain medication may be given to you upon discharge.  Take your pain medication as prescribed, if needed.  If narcotic pain medicine is not needed, then you may take acetaminophen (Tylenol) or ibuprofen (Advil) as needed. No Tylenol or Ibuprofen until 7:30pm if needed 2. Take your usually prescribed medications unless otherwise directed 3. If you need a refill on your pain medication, please contact your pharmacy.  They will contact our office to request authorization.  Prescriptions will not be filled after 5pm or on week-ends. 4. You should eat very light the first 24 hours after surgery, such as soup, crackers, pudding, etc.  Resume your normal diet the day after surgery 5. It is common to experience some constipation if taking pain medication after surgery.  Increasing fluid intake and taking a stool softener will usually help or prevent this problem from occurring.  A mild laxative (Milk of Magnesia or Miralax) should be taken according to package directions if there are no bowel movements after 48 hours. 6. You may shower in 48 hours.  The surgical glue will flake off in 2-3 weeks.   7. ACTIVITIES:  No strenuous activity or heavy lifting for 1 week.   a. You may drive when you no longer are taking prescription pain medication, you can comfortably wear a seatbelt, and you can safely maneuver your car and apply brakes. b. RETURN TO WORK:  __________1 week if applicable.  _______________ Dennis Bast should see your doctor in the office for a follow-up appointment approximately three-four weeks after your surgery.    WHEN TO CALL YOUR DOCTOR: 1. Fever over  101.0 2. Nausea and/or vomiting. 3. Extreme swelling or bruising. 4. Continued bleeding from incision. 5. Increased pain, redness, or drainage from the incision.  The clinic staff is available to answer your questions during regular business hours.  Please dont hesitate to call and ask to speak to one of the nurses for clinical concerns.  If you have a medical emergency, go to the nearest emergency room or call 911.  A surgeon from [facility] is always on call at the hospital.  For further questions, please visit [email or web address]    Post Anesthesia Home Care Instructions  Activity: Get plenty of rest for the remainder of the day. A responsible individual must stay with you for 24 hours following the procedure.  For the next 24 hours, DO NOT: -Drive a car -Paediatric nurse -Drink alcoholic beverages -Take any medication unless instructed by your physician -Make any legal decisions or sign important papers.  Meals: Start with liquid foods such as gelatin or soup. Progress to regular foods as tolerated. Avoid greasy, spicy, heavy foods. If nausea and/or vomiting occur, drink only clear liquids until the nausea and/or vomiting subsides. Call your physician if vomiting continues.  Special Instructions/Symptoms: Your throat may feel dry or sore from the anesthesia or the breathing tube placed in your throat during surgery. If this causes discomfort, gargle with warm salt water. The discomfort should disappear within 24 hours.  If you had a scopolamine patch placed behind your ear for the management of post- operative nausea and/or vomiting:  1. The medication  in the patch is effective for 72 hours, after which it should be removed.  Wrap patch in a tissue and discard in the trash. Wash hands thoroughly with soap and water. 2. You may remove the patch earlier than 72 hours if you experience unpleasant side effects which may include dry mouth, dizziness or visual  disturbances. 3. Avoid touching the patch. Wash your hands with soap and water after contact with the patch.

## 2019-04-12 NOTE — Transfer of Care (Signed)
Immediate Anesthesia Transfer of Care Note  Patient: Julie Rollins  Procedure(s) Performed: INSERTION PORT-A-CATH LEFT SUBCLAVIAN (N/A )  Patient Location: PACU  Anesthesia Type:General  Level of Consciousness: awake, sedated and patient cooperative  Airway & Oxygen Therapy: Patient Spontanous Breathing and Patient connected to nasal cannula oxygen  Post-op Assessment: Report given to RN and Post -op Vital signs reviewed and stable  Post vital signs: Reviewed and stable  Last Vitals:  Vitals Value Taken Time  BP    Temp    Pulse 81 04/12/2019  4:10 PM  Resp 16 04/12/2019  4:10 PM  SpO2 98 % 04/12/2019  4:10 PM  Vitals shown include unvalidated device data.  Last Pain:  Vitals:   04/12/19 1258  TempSrc: Oral  PainSc: 0-No pain         Complications: No apparent anesthesia complications

## 2019-04-12 NOTE — Anesthesia Procedure Notes (Signed)
Procedure Name: LMA Insertion Date/Time: 04/12/2019 3:27 PM Performed by: Signe Colt, CRNA Pre-anesthesia Checklist: Patient identified, Emergency Drugs available, Suction available and Patient being monitored Patient Re-evaluated:Patient Re-evaluated prior to induction Oxygen Delivery Method: Circle system utilized Preoxygenation: Pre-oxygenation with 100% oxygen Induction Type: IV induction Ventilation: Mask ventilation without difficulty LMA: LMA inserted LMA Size: 4.0 Number of attempts: 1 Airway Equipment and Method: Bite block Placement Confirmation: positive ETCO2 Tube secured with: Tape Dental Injury: Teeth and Oropharynx as per pre-operative assessment

## 2019-04-12 NOTE — Interval H&P Note (Signed)
History and Physical Interval Note:  04/12/2019 3:11 PM  Julie Rollins  has presented today for surgery, with the diagnosis of pancreatic cancer.  The various methods of treatment have been discussed with the patient and family. After consideration of risks, benefits and other options for treatment, the patient has consented to  Procedure(s): INSERTION PORT-A-CATH POSSIBLE ULTRASOUND (N/A) as a surgical intervention.  The patient's history has been reviewed, patient examined, no change in status, stable for surgery.  I have reviewed the patient's chart and labs.  Questions were answered to the patient's satisfaction.     Stark Klein

## 2019-04-12 NOTE — Anesthesia Preprocedure Evaluation (Signed)
Anesthesia Evaluation  Patient identified by MRN, date of birth, ID band Patient awake    Reviewed: Allergy & Precautions, H&P , NPO status , Patient's Chart, lab work & pertinent test results  History of Anesthesia Complications Negative for: history of anesthetic complications  Airway Mallampati: III  TM Distance: <3 FB Neck ROM: Limited    Dental  (+) Teeth Intact, Partial Lower   Pulmonary neg pulmonary ROS,    breath sounds clear to auscultation       Cardiovascular hypertension, Pt. on medications (-) angina(-) Past MI and (-) CHF  Rhythm:Regular     Neuro/Psych PSYCHIATRIC DISORDERS Anxiety    GI/Hepatic Neg liver ROS, pancreatic mass   Endo/Other  Hypothyroidism   Renal/GU negative Renal ROS     Musculoskeletal   Abdominal   Peds  Hematology   Anesthesia Other Findings   Reproductive/Obstetrics                             Anesthesia Physical  Anesthesia Plan  ASA: III  Anesthesia Plan: General   Post-op Pain Management:    Induction: Intravenous  PONV Risk Score and Plan: 3 and Ondansetron and Dexamethasone  Airway Management Planned: Oral ETT and LMA  Additional Equipment: None  Intra-op Plan:   Post-operative Plan: Extubation in OR  Informed Consent: I have reviewed the patients History and Physical, chart, labs and discussed the procedure including the risks, benefits and alternatives for the proposed anesthesia with the patient or authorized representative who has indicated his/her understanding and acceptance.     Dental advisory given  Plan Discussed with: CRNA, Surgeon and Anesthesiologist  Anesthesia Plan Comments:         Anesthesia Quick Evaluation

## 2019-04-12 NOTE — Op Note (Signed)
PREOPERATIVE DIAGNOSIS:  Adenocarcinoma of the pancreatic head, cT2N0     POSTOPERATIVE DIAGNOSIS:  Same     PROCEDURE: left subclavian port placement, Bard ClearVue Power Port, MRI safe, 8-French.      SURGEON:  Stark Klein, MD      ANESTHESIA:  General   FINDINGS:  Good venous return, easy flush, and tip of the catheter and   SVC 19 cm.      SPECIMEN:  None.      ESTIMATED BLOOD LOSS:  Minimal.      COMPLICATIONS:  None known.      PROCEDURE:  Pt was identified in the holding area and taken to   the operating room, where patient was placed supine on the operating room   table.  General anesthesia was induced.  Patient's arms were tucked and the upper   chest and neck were prepped and draped in sterile fashion.  Time-out was   performed according to the surgical safety check list.  When all was   correct, we continued.   Local anesthetic was administered over this   area at the angle of the clavicle.  The vein was accessed with 1 pass(es) of the needle. There was good venous return and the wire passed easily with no ectopy.   Fluoroscopy was used to confirm that the wire was in the vena cava.      The patient was placed back level and the area for the pocket was anethetized   with local anesthetic.  A 3-cm transverse incision was made with a #15   blade.  Cautery was used to divide the subcutaneous tissues down to the   pectoralis muscle.  An [employer] retractor was used to elevate the skin   while a pocket was created on top of the pectoralis fascia.  The port   was placed into the pocket to confirm that it was of adequate size.  The   catheter was preattached to the port.  The port was then secured to the   pectoralis fascia with four 2-0 Prolene sutures.  These were clamped and   not tied down yet.    The catheter was tunneled through to the wire exit   site.  The catheter was placed along the wire to determine what length it should be to be in the SVC.  The catheter was  cut at 19 cm.  The tunneler sheath and dilator were passed over the wire and the dilator and wire were removed.  The catheter was advanced through the tunneler sheath and the tunneler sheath was pulled away.  Care was taken to keep the catheter in the tunneler sheath as this occurred. This was advanced and the tunneler sheath was removed.  There was good venous   return and easy flush of the catheter.  The Prolene sutures were tied   down to the pectoral fascia.  The skin was reapproximated using 3-0   Vicryl interrupted deep dermal sutures.    Fluoroscopy was used to re-confirm good position of the catheter.  The skin   was then closed using 4-0 Monocryl in a subcuticular fashion.  The port was flushed with concentrated heparin flush as well.  The wounds were then cleaned, dried, and dressed with Dermabond.  The patient was awakened from anesthesia and taken to the PACU in stable condition.  Needle, sponge, and instrument counts were correct.               Stark Klein, MD

## 2019-04-13 ENCOUNTER — Inpatient Hospital Stay (HOSPITAL_BASED_OUTPATIENT_CLINIC_OR_DEPARTMENT_OTHER): Payer: Medicare HMO | Admitting: Nurse Practitioner

## 2019-04-13 ENCOUNTER — Inpatient Hospital Stay: Payer: Medicare HMO

## 2019-04-13 ENCOUNTER — Encounter (HOSPITAL_BASED_OUTPATIENT_CLINIC_OR_DEPARTMENT_OTHER): Payer: Self-pay | Admitting: General Surgery

## 2019-04-13 ENCOUNTER — Telehealth: Payer: Self-pay | Admitting: Nurse Practitioner

## 2019-04-13 ENCOUNTER — Other Ambulatory Visit: Payer: Self-pay

## 2019-04-13 VITALS — BP 148/62 | HR 82 | Temp 98.2°F | Resp 17

## 2019-04-13 VITALS — BP 133/67 | HR 82 | Temp 98.5°F | Resp 18 | Ht 64.0 in

## 2019-04-13 DIAGNOSIS — Z5111 Encounter for antineoplastic chemotherapy: Secondary | ICD-10-CM | POA: Diagnosis not present

## 2019-04-13 DIAGNOSIS — C250 Malignant neoplasm of head of pancreas: Secondary | ICD-10-CM

## 2019-04-13 DIAGNOSIS — Z95828 Presence of other vascular implants and grafts: Secondary | ICD-10-CM

## 2019-04-13 DIAGNOSIS — I10 Essential (primary) hypertension: Secondary | ICD-10-CM

## 2019-04-13 DIAGNOSIS — E039 Hypothyroidism, unspecified: Secondary | ICD-10-CM

## 2019-04-13 DIAGNOSIS — Z86000 Personal history of in-situ neoplasm of breast: Secondary | ICD-10-CM

## 2019-04-13 DIAGNOSIS — G4441 Drug-induced headache, not elsewhere classified, intractable: Secondary | ICD-10-CM | POA: Diagnosis not present

## 2019-04-13 DIAGNOSIS — Z8052 Family history of malignant neoplasm of bladder: Secondary | ICD-10-CM

## 2019-04-13 LAB — CBC WITH DIFFERENTIAL (CANCER CENTER ONLY)
Abs Immature Granulocytes: 0.05 10*3/uL (ref 0.00–0.07)
Basophils Absolute: 0 10*3/uL (ref 0.0–0.1)
Basophils Relative: 0 %
Eosinophils Absolute: 0 10*3/uL (ref 0.0–0.5)
Eosinophils Relative: 0 %
HCT: 32.7 % — ABNORMAL LOW (ref 36.0–46.0)
Hemoglobin: 10.9 g/dL — ABNORMAL LOW (ref 12.0–15.0)
Immature Granulocytes: 0 %
Lymphocytes Relative: 4 %
Lymphs Abs: 0.5 10*3/uL — ABNORMAL LOW (ref 0.7–4.0)
MCH: 30.9 pg (ref 26.0–34.0)
MCHC: 33.3 g/dL (ref 30.0–36.0)
MCV: 92.6 fL (ref 80.0–100.0)
Monocytes Absolute: 0.3 10*3/uL (ref 0.1–1.0)
Monocytes Relative: 2 %
Neutro Abs: 11.8 10*3/uL — ABNORMAL HIGH (ref 1.7–7.7)
Neutrophils Relative %: 94 %
Platelet Count: 327 10*3/uL (ref 150–400)
RBC: 3.53 MIL/uL — ABNORMAL LOW (ref 3.87–5.11)
RDW: 18.4 % — ABNORMAL HIGH (ref 11.5–15.5)
WBC Count: 12.7 10*3/uL — ABNORMAL HIGH (ref 4.0–10.5)
nRBC: 0 % (ref 0.0–0.2)

## 2019-04-13 LAB — CMP (CANCER CENTER ONLY)
ALT: 49 U/L — ABNORMAL HIGH (ref 0–44)
AST: 28 U/L (ref 15–41)
Albumin: 3.3 g/dL — ABNORMAL LOW (ref 3.5–5.0)
Alkaline Phosphatase: 291 U/L — ABNORMAL HIGH (ref 38–126)
Anion gap: 10 (ref 5–15)
BUN: 15 mg/dL (ref 8–23)
CO2: 25 mmol/L (ref 22–32)
Calcium: 9.2 mg/dL (ref 8.9–10.3)
Chloride: 104 mmol/L (ref 98–111)
Creatinine: 0.72 mg/dL (ref 0.44–1.00)
GFR, Est AFR Am: >60 mL/min (ref >60)
GFR, Estimated: >60 mL/min (ref >60)
Glucose, Bld: 165 mg/dL — ABNORMAL HIGH (ref 70–99)
Potassium: 3.5 mmol/L (ref 3.5–5.1)
Sodium: 139 mmol/L (ref 135–145)
Total Bilirubin: 2.7 mg/dL — ABNORMAL HIGH (ref 0.3–1.2)
Total Protein: 6.9 g/dL (ref 6.5–8.1)

## 2019-04-13 MED ORDER — SODIUM CHLORIDE 0.9% FLUSH
10.0000 mL | INTRAVENOUS | Status: DC | PRN
  Administered 2019-04-13: 10 mL via INTRAVENOUS
  Filled 2019-04-13: qty 10

## 2019-04-13 MED ORDER — PALONOSETRON HCL INJECTION 0.25 MG/5ML
0.2500 mg | Freq: Once | INTRAVENOUS | Status: AC
  Administered 2019-04-13: 12:00:00 0.25 mg via INTRAVENOUS

## 2019-04-13 MED ORDER — ACETAMINOPHEN 325 MG PO TABS
650.0000 mg | ORAL_TABLET | Freq: Once | ORAL | Status: AC
  Administered 2019-04-13: 16:00:00 650 mg via ORAL

## 2019-04-13 MED ORDER — DEXTROSE 5 % IV SOLN
Freq: Once | INTRAVENOUS | Status: AC
  Administered 2019-04-13: 12:00:00 via INTRAVENOUS
  Filled 2019-04-13: qty 250

## 2019-04-13 MED ORDER — SODIUM CHLORIDE 0.9 % IV SOLN
2400.0000 mg/m2 | INTRAVENOUS | Status: DC
  Administered 2019-04-13: 18:00:00 4000 mg via INTRAVENOUS
  Filled 2019-04-13: qty 80

## 2019-04-13 MED ORDER — OXALIPLATIN CHEMO INJECTION 100 MG/20ML
70.0000 mg/m2 | Freq: Once | INTRAVENOUS | Status: AC
  Administered 2019-04-13: 13:00:00 115 mg via INTRAVENOUS
  Filled 2019-04-13: qty 10

## 2019-04-13 MED ORDER — LEUCOVORIN CALCIUM INJECTION 350 MG
400.0000 mg/m2 | Freq: Once | INTRAVENOUS | Status: AC
  Administered 2019-04-13: 16:00:00 664 mg via INTRAVENOUS
  Filled 2019-04-13: qty 33.2

## 2019-04-13 MED ORDER — DEXAMETHASONE SODIUM PHOSPHATE 10 MG/ML IJ SOLN
INTRAMUSCULAR | Status: AC
  Filled 2019-04-13: qty 1

## 2019-04-13 MED ORDER — DEXAMETHASONE SODIUM PHOSPHATE 10 MG/ML IJ SOLN
10.0000 mg | Freq: Once | INTRAMUSCULAR | Status: AC
  Administered 2019-04-13: 10 mg via INTRAVENOUS

## 2019-04-13 MED ORDER — ACETAMINOPHEN 325 MG PO TABS
ORAL_TABLET | ORAL | Status: AC
  Filled 2019-04-13: qty 2

## 2019-04-13 MED ORDER — IRINOTECAN HCL CHEMO INJECTION 100 MG/5ML
80.0000 mg/m2 | Freq: Once | INTRAVENOUS | Status: AC
  Administered 2019-04-13: 15:00:00 140 mg via INTRAVENOUS
  Filled 2019-04-13: qty 7

## 2019-04-13 MED ORDER — PALONOSETRON HCL INJECTION 0.25 MG/5ML
INTRAVENOUS | Status: AC
  Filled 2019-04-13: qty 5

## 2019-04-13 NOTE — Patient Instructions (Signed)
[facility] Discharge Instructions for Patients Receiving Chemotherapy  Today you received the following chemotherapy agents: Oxaliplatin, Leucovorin, Irinotecan, and Fluorouracil  To help prevent nausea and vomiting after your treatment, we encourage you to take your nausea medication as prescribed.  DO NOT TAKE ZOFRAN (ONDANSTERON) FOR 3 DAYS FOLLOWING TREATMENT!   If you develop nausea and vomiting that is not controlled by your nausea medication, call the clinic.   BELOW ARE SYMPTOMS THAT SHOULD BE REPORTED IMMEDIATELY:  *FEVER GREATER THAN 100.5 F  *CHILLS WITH OR WITHOUT FEVER  NAUSEA AND VOMITING THAT IS NOT CONTROLLED WITH YOUR NAUSEA MEDICATION  *UNUSUAL SHORTNESS OF BREATH  *UNUSUAL BRUISING OR BLEEDING  TENDERNESS IN MOUTH AND THROAT WITH OR WITHOUT PRESENCE OF ULCERS  *URINARY PROBLEMS  *BOWEL PROBLEMS  UNUSUAL RASH Items with * indicate a potential emergency and should be followed up as soon as possible.  Feel free to call the clinic should you have any questions or concerns. The clinic phone number is [phone].  Please show the [place] at check-in to the Emergency Department and triage nurse.    Oxaliplatin Injection What is this medicine? OXALIPLATIN (ox AL i PLA tin) is a chemotherapy drug. It targets fast dividing cells, like cancer cells, and causes these cells to die. This medicine is used to treat cancers of the colon and rectum, and many other cancers. This medicine may be used for other purposes; ask your health care provider or pharmacist if you have questions. COMMON BRAND NAME(S): Eloxatin What should I tell my health care provider before I take this medicine? They need to know if you have any of these conditions: -kidney disease -an unusual or allergic reaction to oxaliplatin, other chemotherapy, other medicines, foods, dyes, or preservatives -pregnant or trying to get pregnant -breast-feeding How should I use  this medicine? This drug is given as an infusion into a vein. It is administered in a hospital or clinic by a specially trained health care professional. Talk to your pediatrician regarding the use of this medicine in children. Special care may be needed. Overdosage: If you think you have taken too much of this medicine contact a poison control center or emergency room at once. NOTE: This medicine is only for you. Do not share this medicine with others. What if I miss a dose? It is important not to miss a dose. Call your doctor or health care professional if you are unable to keep an appointment. What may interact with this medicine? -medicines to increase blood counts like filgrastim, pegfilgrastim, sargramostim -probenecid -some antibiotics like amikacin, gentamicin, neomycin, polymyxin B, streptomycin, tobramycin -zalcitabine Talk to your doctor or health care professional before taking any of these medicines: -acetaminophen -aspirin -ibuprofen -ketoprofen -naproxen This list may not describe all possible interactions. Give your health care provider a list of all the medicines, herbs, non-prescription drugs, or dietary supplements you use. Also tell them if you smoke, drink alcohol, or use illegal drugs. Some items may interact with your medicine. What should I watch for while using this medicine? Your condition will be monitored carefully while you are receiving this medicine. You will need important blood work done while you are taking this medicine. This medicine can make you more sensitive to cold. Do not drink cold drinks or use ice. Cover exposed skin before coming in contact with cold temperatures or cold objects. When out in cold weather wear warm clothing and cover your mouth and nose to warm the air that  goes into your lungs. Tell your doctor if you get sensitive to the cold. This drug may make you feel generally unwell. This is not uncommon, as chemotherapy can affect healthy  cells as well as cancer cells. Report any side effects. Continue your course of treatment even though you feel ill unless your doctor tells you to stop. In some cases, you may be given additional medicines to help with side effects. Follow all directions for their use. Call your doctor or health care professional for advice if you get a fever, chills or sore throat, or other symptoms of a cold or flu. Do not treat yourself. This drug decreases your body's ability to fight infections. Try to avoid being around people who are sick. This medicine may increase your risk to bruise or bleed. Call your doctor or health care professional if you notice any unusual bleeding. Be careful brushing and flossing your teeth or using a toothpick because you may get an infection or bleed more easily. If you have any dental work done, tell your dentist you are receiving this medicine. Avoid taking products that contain aspirin, acetaminophen, ibuprofen, naproxen, or ketoprofen unless instructed by your doctor. These medicines may hide a fever. Do not become pregnant while taking this medicine. Women should inform their doctor if they wish to become pregnant or think they might be pregnant. There is a potential for serious side effects to an unborn child. Talk to your health care professional or pharmacist for more information. Do not breast-feed an infant while taking this medicine. Call your doctor or health care professional if you get diarrhea. Do not treat yourself. What side effects may I notice from receiving this medicine? Side effects that you should report to your doctor or health care professional as soon as possible: -allergic reactions like skin rash, itching or hives, swelling of the face, lips, or tongue -low blood counts - This drug may decrease the number of white blood cells, red blood cells and platelets. You may be at increased risk for infections and bleeding. -signs of infection - fever or chills,  cough, sore throat, pain or difficulty passing urine -signs of decreased platelets or bleeding - bruising, pinpoint red spots on the skin, black, tarry stools, nosebleeds -signs of decreased red blood cells - unusually weak or tired, fainting spells, lightheadedness -breathing problems -chest pain, pressure -cough -diarrhea -jaw tightness -mouth sores -nausea and vomiting -pain, swelling, redness or irritation at the injection site -pain, tingling, numbness in the hands or feet -problems with balance, talking, walking -redness, blistering, peeling or loosening of the skin, including inside the mouth -trouble passing urine or change in the amount of urine Side effects that usually do not require medical attention (report to your doctor or health care professional if they continue or are bothersome): -changes in vision -constipation -hair loss -loss of appetite -metallic taste in the mouth or changes in taste -stomach pain This list may not describe all possible side effects. Call your doctor for medical advice about side effects. You may report side effects to FDA at 1-800-FDA-1088. Where should I keep my medicine? This drug is given in a hospital or clinic and will not be stored at home. NOTE: This sheet is a summary. It may not cover all possible information. If you have questions about this medicine, talk to your doctor, pharmacist, or health care provider.  2019 Elsevier/Gold Standard (2008-06-06 17:22:47)   Leucovorin injection What is this medicine? LEUCOVORIN (loo koe VOR in) is used to  prevent or treat the harmful effects of some medicines. This medicine is used to treat anemia caused by a low amount of folic acid in the body. It is also used with 5-fluorouracil (5-FU) to treat colon cancer. This medicine may be used for other purposes; ask your health care provider or pharmacist if you have questions. What should I tell my health care provider before I take this  medicine? They need to know if you have any of these conditions: -anemia from low levels of vitamin B-12 in the blood -an unusual or allergic reaction to leucovorin, folic acid, other medicines, foods, dyes, or preservatives -pregnant or trying to get pregnant -breast-feeding How should I use this medicine? This medicine is for injection into a muscle or into a vein. It is given by a health care professional in a hospital or clinic setting. Talk to your pediatrician regarding the use of this medicine in children. Special care may be needed. Overdosage: If you think you have taken too much of this medicine contact a poison control center or emergency room at once. NOTE: This medicine is only for you. Do not share this medicine with others. What if I miss a dose? This does not apply. What may interact with this medicine? -capecitabine -fluorouracil -phenobarbital -phenytoin -primidone -trimethoprim-sulfamethoxazole This list may not describe all possible interactions. Give your health care provider a list of all the medicines, herbs, non-prescription drugs, or dietary supplements you use. Also tell them if you smoke, drink alcohol, or use illegal drugs. Some items may interact with your medicine. What should I watch for while using this medicine? Your condition will be monitored carefully while you are receiving this medicine. This medicine may increase the side effects of 5-fluorouracil, 5-FU. Tell your doctor or health care professional if you have diarrhea or mouth sores that do not get better or that get worse. What side effects may I notice from receiving this medicine? Side effects that you should report to your doctor or health care professional as soon as possible: -allergic reactions like skin rash, itching or hives, swelling of the face, lips, or tongue -breathing problems -fever, infection -mouth sores -unusual bleeding or bruising -unusually weak or tired Side effects that  usually do not require medical attention (report to your doctor or health care professional if they continue or are bothersome): -constipation or diarrhea -loss of appetite -nausea, vomiting This list may not describe all possible side effects. Call your doctor for medical advice about side effects. You may report side effects to FDA at 1-800-FDA-1088. Where should I keep my medicine? This drug is given in a hospital or clinic and will not be stored at home. NOTE: This sheet is a summary. It may not cover all possible information. If you have questions about this medicine, talk to your doctor, pharmacist, or health care provider.  2019 Elsevier/Gold Standard (2008-05-16 16:50:29)   Irinotecan injection What is this medicine? IRINOTECAN (ir in oh TEE kan ) is a chemotherapy drug. It is used to treat colon and rectal cancer. This medicine may be used for other purposes; ask your health care provider or pharmacist if you have questions. COMMON BRAND NAME(S): Camptosar What should I tell my health care provider before I take this medicine? They need to know if you have any of these conditions: -dehydration -diarrhea -infection (especially a virus infection such as chickenpox, cold sores, or herpes) -liver disease -low blood counts, like low white cell, platelet, or red cell counts -low levels of calcium,  magnesium, or potassium in the blood -recent or ongoing radiation therapy -an unusual or allergic reaction to irinotecan, other medicines, foods, dyes, or preservatives -pregnant or trying to get pregnant -breast-feeding How should I use this medicine? This drug is given as an infusion into a vein. It is administered in a hospital or clinic by a specially trained health care professional. Talk to your pediatrician regarding the use of this medicine in children. Special care may be needed. Overdosage: If you think you have taken too much of this medicine contact a poison control center or  emergency room at once. NOTE: This medicine is only for you. Do not share this medicine with others. What if I miss a dose? It is important not to miss your dose. Call your doctor or health care professional if you are unable to keep an appointment. What may interact with this medicine? This medicine may interact with the following medications: -antiviral medicines for HIV or AIDS -certain antibiotics like rifampin or rifabutin -certain medicines for fungal infections like itraconazole, ketoconazole, posaconazole, and voriconazole -certain medicines for seizures like carbamazepine, phenobarbital, phenotoin -clarithromycin -gemfibrozil -nefazodone -St. John's Wort This list may not describe all possible interactions. Give your health care provider a list of all the medicines, herbs, non-prescription drugs, or dietary supplements you use. Also tell them if you smoke, drink alcohol, or use illegal drugs. Some items may interact with your medicine. What should I watch for while using this medicine? Your condition will be monitored carefully while you are receiving this medicine. You will need important blood work done while you are taking this medicine. This drug may make you feel generally unwell. This is not uncommon, as chemotherapy can affect healthy cells as well as cancer cells. Report any side effects. Continue your course of treatment even though you feel ill unless your doctor tells you to stop. In some cases, you may be given additional medicines to help with side effects. Follow all directions for their use. You may get drowsy or dizzy. Do not drive, use machinery, or do anything that needs mental alertness until you know how this medicine affects you. Do not stand or sit up quickly, especially if you are an older patient. This reduces the risk of dizzy or fainting spells. Call your doctor or health care professional for advice if you get a fever, chills or sore throat, or other symptoms  of a cold or flu. Do not treat yourself. This drug decreases your body's ability to fight infections. Try to avoid being around people who are sick. This medicine may increase your risk to bruise or bleed. Call your doctor or health care professional if you notice any unusual bleeding. Be careful brushing and flossing your teeth or using a toothpick because you may get an infection or bleed more easily. If you have any dental work done, tell your dentist you are receiving this medicine. Avoid taking products that contain aspirin, acetaminophen, ibuprofen, naproxen, or ketoprofen unless instructed by your doctor. These medicines may hide a fever. Do not become pregnant while taking this medicine. Women should inform their doctor if they wish to become pregnant or think they might be pregnant. There is a potential for serious side effects to an unborn child. Talk to your health care professional or pharmacist for more information. Do not breast-feed an infant while taking this medicine. What side effects may I notice from receiving this medicine? Side effects that you should report to your doctor or health care professional as  soon as possible: -allergic reactions like skin rash, itching or hives, swelling of the face, lips, or tongue -chest pain -diarrhea -flushing, runny nose, sweating during infusion -low blood counts - this medicine may decrease the number of white blood cells, red blood cells and platelets. You may be at increased risk for infections and bleeding. -nausea, vomiting -pain, swelling, warmth in the leg -signs of decreased platelets or bleeding - bruising, pinpoint red spots on the skin, black, tarry stools, blood in the urine -signs of infection - fever or chills, cough, sore throat, pain or difficulty passing urine -signs of decreased red blood cells - unusually weak or tired, fainting spells, lightheadedness Side effects that usually do not require medical attention (report to  your doctor or health care professional if they continue or are bothersome): -constipation -hair loss -headache -loss of appetite -mouth sores -stomach pain This list may not describe all possible side effects. Call your doctor for medical advice about side effects. You may report side effects to FDA at 1-800-FDA-1088. Where should I keep my medicine? This drug is given in a hospital or clinic and will not be stored at home. NOTE: This sheet is a summary. It may not cover all possible information. If you have questions about this medicine, talk to your doctor, pharmacist, or health care provider.  2019 Elsevier/Gold Standard (2018-01-26 15:02:58)   Fluorouracil, 5-FU injection What is this medicine? FLUOROURACIL, 5-FU (flure oh YOOR a sil) is a chemotherapy drug. It slows the growth of cancer cells. This medicine is used to treat many types of cancer like breast cancer, colon or rectal cancer, pancreatic cancer, and stomach cancer. This medicine may be used for other purposes; ask your health care provider or pharmacist if you have questions. COMMON BRAND NAME(S): Adrucil What should I tell my health care provider before I take this medicine? They need to know if you have any of these conditions: -blood disorders -dihydropyrimidine dehydrogenase (DPD) deficiency -infection (especially a virus infection such as chickenpox, cold sores, or herpes) -kidney disease -liver disease -malnourished, poor nutrition -recent or ongoing radiation therapy -an unusual or allergic reaction to fluorouracil, other chemotherapy, other medicines, foods, dyes, or preservatives -pregnant or trying to get pregnant -breast-feeding How should I use this medicine? This drug is given as an infusion or injection into a vein. It is administered in a hospital or clinic by a specially trained health care professional. Talk to your pediatrician regarding the use of this medicine in children. Special care may be  needed. Overdosage: If you think you have taken too much of this medicine contact a poison control center or emergency room at once. NOTE: This medicine is only for you. Do not share this medicine with others. What if I miss a dose? It is important not to miss your dose. Call your doctor or health care professional if you are unable to keep an appointment. What may interact with this medicine? -allopurinol -cimetidine -dapsone -digoxin -hydroxyurea -leucovorin -levamisole -medicines for seizures like ethotoin, fosphenytoin, phenytoin -medicines to increase blood counts like filgrastim, pegfilgrastim, sargramostim -medicines that treat or prevent blood clots like warfarin, enoxaparin, and dalteparin -methotrexate -metronidazole -pyrimethamine -some other chemotherapy drugs like busulfan, cisplatin, estramustine, vinblastine -trimethoprim -trimetrexate -vaccines Talk to your doctor or health care professional before taking any of these medicines: -acetaminophen -aspirin -ibuprofen -ketoprofen -naproxen This list may not describe all possible interactions. Give your health care provider a list of all the medicines, herbs, non-prescription drugs, or dietary supplements you use. Also  tell them if you smoke, drink alcohol, or use illegal drugs. Some items may interact with your medicine. What should I watch for while using this medicine? Visit your doctor for checks on your progress. This drug may make you feel generally unwell. This is not uncommon, as chemotherapy can affect healthy cells as well as cancer cells. Report any side effects. Continue your course of treatment even though you feel ill unless your doctor tells you to stop. In some cases, you may be given additional medicines to help with side effects. Follow all directions for their use. Call your doctor or health care professional for advice if you get a fever, chills or sore throat, or other symptoms of a cold or flu. Do not  treat yourself. This drug decreases your body's ability to fight infections. Try to avoid being around people who are sick. This medicine may increase your risk to bruise or bleed. Call your doctor or health care professional if you notice any unusual bleeding. Be careful brushing and flossing your teeth or using a toothpick because you may get an infection or bleed more easily. If you have any dental work done, tell your dentist you are receiving this medicine. Avoid taking products that contain aspirin, acetaminophen, ibuprofen, naproxen, or ketoprofen unless instructed by your doctor. These medicines may hide a fever. Do not become pregnant while taking this medicine. Women should inform their doctor if they wish to become pregnant or think they might be pregnant. There is a potential for serious side effects to an unborn child. Talk to your health care professional or pharmacist for more information. Do not breast-feed an infant while taking this medicine. Men should inform their doctor if they wish to father a child. This medicine may lower sperm counts. Do not treat diarrhea with over the counter products. Contact your doctor if you have diarrhea that lasts more than 2 days or if it is severe and watery. This medicine can make you more sensitive to the sun. Keep out of the sun. If you cannot avoid being in the sun, wear protective clothing and use sunscreen. Do not use sun lamps or tanning beds/booths. What side effects may I notice from receiving this medicine? Side effects that you should report to your doctor or health care professional as soon as possible: -allergic reactions like skin rash, itching or hives, swelling of the face, lips, or tongue -low blood counts - this medicine may decrease the number of white blood cells, red blood cells and platelets. You may be at increased risk for infections and bleeding. -signs of infection - fever or chills, cough, sore throat, pain or difficulty  passing urine -signs of decreased platelets or bleeding - bruising, pinpoint red spots on the skin, black, tarry stools, blood in the urine -signs of decreased red blood cells - unusually weak or tired, fainting spells, lightheadedness -breathing problems -changes in vision -chest pain -mouth sores -nausea and vomiting -pain, swelling, redness at site where injected -pain, tingling, numbness in the hands or feet -redness, swelling, or sores on hands or feet -stomach pain -unusual bleeding Side effects that usually do not require medical attention (report to your doctor or health care professional if they continue or are bothersome): -changes in finger or toe nails -diarrhea -dry or itchy skin -hair loss -headache -loss of appetite -sensitivity of eyes to the light -stomach upset -unusually teary eyes This list may not describe all possible side effects. Call your doctor for medical advice about side effects.  You may report side effects to FDA at 1-800-FDA-1088. Where should I keep my medicine? This drug is given in a hospital or clinic and will not be stored at home. NOTE: This sheet is a summary. It may not cover all possible information. If you have questions about this medicine, talk to your doctor, pharmacist, or health care provider.  2019 Elsevier/Gold Standard (2008-03-15 13:53:16)

## 2019-04-13 NOTE — Progress Notes (Signed)
Ok to treat with abnormal labs (CMP & CBC) per NP Mendel Ryder today.

## 2019-04-13 NOTE — Telephone Encounter (Signed)
No los per 5/20. °

## 2019-04-13 NOTE — Progress Notes (Signed)
About 20 min into pt's Irinotecan infusion, pt began c/o an intermittent headache that came on suddenly. Infusions paused and notified provider. Vitals stable and pt denied any other symptoms (no CP, SOB, slurred speech, or ataxia). Per Elray Buba per Dr. Benay Spice, pt's headache could be related to Oxaliplatin pt received earlier in her regimena. Orders placed for 650 mg Tylenol PO and administered at 16:11. Plan relayed to pt and instructed to notify staff if symptoms worsen or new ones arise. Pt verbalized understanding and agreement. Will continue to monitor.   Vitals:   04/13/19 1552  BP: (!) 148/62  Pulse: 82  Resp: 17  Temp: 98.2 F (36.8 C)  TempSrc: Oral  SpO2: 98%

## 2019-04-14 ENCOUNTER — Telehealth: Payer: Self-pay

## 2019-04-14 NOTE — Telephone Encounter (Signed)
Left voice message for patient - chemo follow up first time folfirinox.  Requested she return my call.

## 2019-04-14 NOTE — Telephone Encounter (Signed)
-----   Message from Arman Bogus, RN sent at 04/13/2019  5:27 PM EDT ----- Regarding: [name], 1st time First time folfirinox, Feng, tolerated well

## 2019-04-15 ENCOUNTER — Ambulatory Visit (HOSPITAL_COMMUNITY)
Admission: RE | Admit: 2019-04-15 | Discharge: 2019-04-15 | Disposition: A | Discharge status: Home / Self Care | Payer: Medicare HMO | Source: Ambulatory Visit | Attending: Nurse Practitioner | Admitting: Nurse Practitioner

## 2019-04-15 ENCOUNTER — Inpatient Hospital Stay: Payer: Medicare HMO

## 2019-04-15 ENCOUNTER — Telehealth: Payer: Self-pay | Admitting: *Deleted

## 2019-04-15 ENCOUNTER — Other Ambulatory Visit: Payer: Self-pay

## 2019-04-15 VITALS — BP 134/65 | HR 71 | Temp 98.9°F | Resp 18

## 2019-04-15 DIAGNOSIS — C250 Malignant neoplasm of head of pancreas: Secondary | ICD-10-CM

## 2019-04-15 DIAGNOSIS — Z5111 Encounter for antineoplastic chemotherapy: Secondary | ICD-10-CM | POA: Diagnosis not present

## 2019-04-15 MED ORDER — SODIUM CHLORIDE 0.9% FLUSH
10.0000 mL | INTRAVENOUS | Status: DC | PRN
  Administered 2019-04-15: 10 mL
  Filled 2019-04-15: qty 10

## 2019-04-15 MED ORDER — HEPARIN SOD (PORK) LOCK FLUSH 100 UNIT/ML IV SOLN
500.0000 [IU] | Freq: Once | INTRAVENOUS | Status: AC | PRN
  Administered 2019-04-15: 500 [IU]
  Filled 2019-04-15: qty 5

## 2019-04-15 NOTE — Telephone Encounter (Signed)
Julie Rollins called reporting "ambulatory infusion pump started beeping.  I was told to call if it beeps before 3:00 pm.  Transportation is to arrive at 2:45 pm.  What do I do?"  Solicitor and flush room notified.

## 2019-04-15 NOTE — Progress Notes (Signed)
Bilateral lower extremity venous duplex completed. Refer to "CV Proc" under chart review to view preliminary results.  04/15/2019 11:59 AM Maudry Mayhew, MHA, RVT, RDCS, RDMS

## 2019-04-19 ENCOUNTER — Encounter: Payer: Self-pay | Admitting: Gastroenterology

## 2019-04-19 ENCOUNTER — Telehealth: Payer: Self-pay | Admitting: *Deleted
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
## 2019-04-19 NOTE — Telephone Encounter (Signed)
I went ahead and scheduled IVF but will assess her tomorrow.  Thanks, Regan Rakers

## 2019-04-19 NOTE — Telephone Encounter (Signed)
Pt called reporting she had diarrhea and vomited yesterday.  Had diarrhea today, and wanted to know what to do. Spoke with pt, and was informed that she had 2 -3 episodes of diarrhea, did not take Imodium. Today, pt had diarrhea this am - took 2 tabs Imodium, then another one not too long ago.  Pt stated she is drinking Gatorade; has not had any nausea/vomiting today.  Went over instructions on how to take antiemetics  Instructed pt to continue to push po fluids as tolerated, and call nurse back around 1 pm today if diarrhea persists. Pt has follow up visit with Regan Rakers, NP on  04/20/19.   Pt voiced understanding. Pt's    Phone     [phone].

## 2019-04-20 ENCOUNTER — Inpatient Hospital Stay: Payer: Medicare HMO

## 2019-04-20 ENCOUNTER — Encounter: Payer: Self-pay | Admitting: Nurse Practitioner

## 2019-04-20 ENCOUNTER — Inpatient Hospital Stay (HOSPITAL_BASED_OUTPATIENT_CLINIC_OR_DEPARTMENT_OTHER): Payer: Medicare HMO | Admitting: Nurse Practitioner

## 2019-04-20 ENCOUNTER — Other Ambulatory Visit: Payer: Self-pay

## 2019-04-20 VITALS — BP 143/80 | HR 106 | Temp 98.2°F | Resp 18 | Ht 64.0 in | Wt 130.0 lb

## 2019-04-20 DIAGNOSIS — R112 Nausea with vomiting, unspecified: Secondary | ICD-10-CM | POA: Diagnosis not present

## 2019-04-20 DIAGNOSIS — T451X5A Adverse effect of antineoplastic and immunosuppressive drugs, initial encounter: Secondary | ICD-10-CM

## 2019-04-20 DIAGNOSIS — E039 Hypothyroidism, unspecified: Secondary | ICD-10-CM

## 2019-04-20 DIAGNOSIS — C784 Secondary malignant neoplasm of small intestine: Secondary | ICD-10-CM | POA: Diagnosis not present

## 2019-04-20 DIAGNOSIS — R609 Edema, unspecified: Secondary | ICD-10-CM

## 2019-04-20 DIAGNOSIS — R197 Diarrhea, unspecified: Secondary | ICD-10-CM

## 2019-04-20 DIAGNOSIS — C250 Malignant neoplasm of head of pancreas: Secondary | ICD-10-CM

## 2019-04-20 DIAGNOSIS — Z95828 Presence of other vascular implants and grafts: Secondary | ICD-10-CM

## 2019-04-20 DIAGNOSIS — I10 Essential (primary) hypertension: Secondary | ICD-10-CM

## 2019-04-20 DIAGNOSIS — Z5111 Encounter for antineoplastic chemotherapy: Secondary | ICD-10-CM | POA: Diagnosis not present

## 2019-04-20 LAB — CBC WITH DIFFERENTIAL (CANCER CENTER ONLY)
Abs Immature Granulocytes: 0.03 10*3/uL (ref 0.00–0.07)
Basophils Absolute: 0 10*3/uL (ref 0.0–0.1)
Basophils Relative: 0 %
Eosinophils Absolute: 0.3 10*3/uL (ref 0.0–0.5)
Eosinophils Relative: 5 %
HCT: 35.9 % — ABNORMAL LOW (ref 36.0–46.0)
Hemoglobin: 12.1 g/dL (ref 12.0–15.0)
Immature Granulocytes: 1 %
Lymphocytes Relative: 17 %
Lymphs Abs: 0.9 10*3/uL (ref 0.7–4.0)
MCH: 31.3 pg (ref 26.0–34.0)
MCHC: 33.7 g/dL (ref 30.0–36.0)
MCV: 92.8 fL (ref 80.0–100.0)
Monocytes Absolute: 0.4 10*3/uL (ref 0.1–1.0)
Monocytes Relative: 6 %
Neutro Abs: 4 10*3/uL (ref 1.7–7.7)
Neutrophils Relative %: 71 %
Platelet Count: 317 10*3/uL (ref 150–400)
RBC: 3.87 MIL/uL (ref 3.87–5.11)
RDW: 16.9 % — ABNORMAL HIGH (ref 11.5–15.5)
WBC Count: 5.5 10*3/uL (ref 4.0–10.5)
nRBC: 0 % (ref 0.0–0.2)

## 2019-04-20 LAB — CMP (CANCER CENTER ONLY)
ALT: 46 U/L — ABNORMAL HIGH (ref 0–44)
AST: 28 U/L (ref 15–41)
Albumin: 3.5 g/dL (ref 3.5–5.0)
Alkaline Phosphatase: 219 U/L — ABNORMAL HIGH (ref 38–126)
Anion gap: 10 (ref 5–15)
BUN: 10 mg/dL (ref 8–23)
CO2: 23 mmol/L (ref 22–32)
Calcium: 9.2 mg/dL (ref 8.9–10.3)
Chloride: 101 mmol/L (ref 98–111)
Creatinine: 0.76 mg/dL (ref 0.44–1.00)
GFR, Est AFR Am: >60 mL/min (ref >60)
GFR, Estimated: >60 mL/min (ref >60)
Glucose, Bld: 123 mg/dL — ABNORMAL HIGH (ref 70–99)
Potassium: 3.6 mmol/L (ref 3.5–5.1)
Sodium: 134 mmol/L — ABNORMAL LOW (ref 135–145)
Total Bilirubin: 2 mg/dL — ABNORMAL HIGH (ref 0.3–1.2)
Total Protein: 7.2 g/dL (ref 6.5–8.1)

## 2019-04-20 MED ORDER — PROCHLORPERAZINE MALEATE 10 MG PO TABS
10.0000 mg | ORAL_TABLET | Freq: Once | ORAL | Status: AC
  Administered 2019-04-20: 10 mg via ORAL

## 2019-04-20 MED ORDER — LOPERAMIDE HCL 2 MG PO TABS
4.0000 mg | ORAL_TABLET | Freq: Once | ORAL | Status: AC
  Administered 2019-04-20: 4 mg via ORAL
  Filled 2019-04-20: qty 2

## 2019-04-20 MED ORDER — SODIUM CHLORIDE 0.9 % IV SOLN: 10.0000 mg | Freq: Once | INTRAVENOUS | Status: DC

## 2019-04-20 MED ORDER — DEXAMETHASONE SODIUM PHOSPHATE 10 MG/ML IJ SOLN
INTRAMUSCULAR | Status: AC
  Filled 2019-04-20: qty 1

## 2019-04-20 MED ORDER — PROCHLORPERAZINE MALEATE 10 MG PO TABS
ORAL_TABLET | ORAL | Status: AC
  Filled 2019-04-20: qty 1

## 2019-04-20 MED ORDER — HEPARIN SOD (PORK) LOCK FLUSH 100 UNIT/ML IV SOLN
500.0000 [IU] | Freq: Once | INTRAVENOUS | Status: AC
  Administered 2019-04-20: 500 [IU] via INTRAVENOUS
  Filled 2019-04-20: qty 5

## 2019-04-20 MED ORDER — LOPERAMIDE HCL 2 MG PO CAPS
ORAL_CAPSULE | ORAL | Status: AC
  Filled 2019-04-20: qty 2

## 2019-04-20 MED ORDER — SODIUM CHLORIDE 0.9% FLUSH
10.0000 mL | INTRAVENOUS | Status: DC | PRN
  Administered 2019-04-20: 10 mL via INTRAVENOUS
  Filled 2019-04-20: qty 10

## 2019-04-20 MED ORDER — SODIUM CHLORIDE 0.9% FLUSH
10.0000 mL | Freq: Once | INTRAVENOUS | Status: AC
  Administered 2019-04-20: 15:00:00 10 mL via INTRAVENOUS
  Filled 2019-04-20: qty 10

## 2019-04-20 MED ORDER — DEXAMETHASONE SODIUM PHOSPHATE 10 MG/ML IJ SOLN
10.0000 mg | Freq: Once | INTRAMUSCULAR | Status: AC
  Administered 2019-04-20: 10 mg via INTRAVENOUS

## 2019-04-20 MED ORDER — SODIUM CHLORIDE 0.9 % IV SOLN
Freq: Once | INTRAVENOUS | Status: AC
  Administered 2019-04-20: 13:00:00 via INTRAVENOUS
  Filled 2019-04-20: qty 250

## 2019-04-20 NOTE — Patient Instructions (Signed)
.   Dehydration, Adult  Dehydration is when there is not enough fluid or water in your body. This happens when you lose more fluids than you take in. Dehydration can range from mild to very bad. It should be treated right away to keep it from getting very bad. Symptoms of mild dehydration may include:  Thirst.  Dry lips.  Slightly dry mouth.  Dry, warm skin.  Dizziness. Symptoms of moderate dehydration may include:  Very dry mouth.  Muscle cramps.  Dark pee (urine). Pee may be the color of tea.  Your body making less pee.  Your eyes making fewer tears.  Heartbeat that is uneven or faster than normal (palpitations).  Headache.  Light-headedness, especially when you stand up from sitting.  Fainting (syncope). Symptoms of very bad dehydration may include:  Changes in skin, such as: ? Cold and clammy skin. ? Blotchy (mottled) or pale skin. ? Skin that does not quickly return to normal after being lightly pinched and let go (poor skin turgor).  Changes in body fluids, such as: ? Feeling very thirsty. ? Your eyes making fewer tears. ? Not sweating when body temperature is high, such as in hot weather. ? Your body making very little pee.  Changes in vital signs, such as: ? Weak pulse. ? Pulse that is more than 100 beats a minute when you are sitting still. ? Fast breathing. ? Low blood pressure.  Other changes, such as: ? Sunken eyes. ? Cold hands and feet. ? Confusion. ? Lack of energy (lethargy). ? Trouble waking up from sleep. ? Short-term weight loss. ? Unconsciousness. Follow these instructions at home:   If told by your doctor, drink an ORS: ? Make an ORS by using instructions on the package. ? Start by drinking small amounts, about  cup (120 mL) every 5-10 minutes. ? Slowly drink more until you have had the amount that your doctor said to have.  Drink enough clear fluid to keep your pee clear or pale yellow. If you were told to drink an ORS,  finish the ORS first, then start slowly drinking clear fluids. Drink fluids such as: ? Water. Do not drink only water by itself. Doing that can make the salt (sodium) level in your body get too low (hyponatremia). ? Ice chips. ? Fruit juice that you have added water to (diluted). ? Low-calorie sports drinks.  Avoid: ? Alcohol. ? Drinks that have a lot of sugar. These include high-calorie sports drinks, fruit juice that does not have water added, and soda. ? Caffeine. ? Foods that are greasy or have a lot of fat or sugar.  Take over-the-counter and prescription medicines only as told by your doctor.  Do not take salt tablets. Doing that can make the salt level in your body get too high (hypernatremia).  Eat foods that have minerals (electrolytes). Examples include bananas, oranges, potatoes, tomatoes, and spinach.  Keep all follow-up visits as told by your doctor. This is important. Contact a doctor if:  You have belly (abdominal) pain that: ? Gets worse. ? Stays in one area (localizes).  You have a rash.  You have a stiff neck.  You get angry or annoyed more easily than normal (irritability).  You are more sleepy than normal.  You have a harder time waking up than normal.  You feel: ? Weak. ? Dizzy. ? Very thirsty.  You have peed (urinated) only a small amount of very dark pee during 6-8 hours. Get help right away if:  You have symptoms of very bad dehydration.  You cannot drink fluids without throwing up (vomiting).  Your symptoms get worse with treatment.  You have a fever.  You have a very bad headache.  You are throwing up or having watery poop (diarrhea) and it: ? Gets worse. ? Does not go away.  You have blood or something green (bile) in your throw-up.  You have blood in your poop (stool). This may cause poop to look black and tarry.  You have not peed in 6-8 hours.  You pass out (faint).  Your heart rate when you are sitting still is more than  100 beats a minute.  You have trouble breathing. This information is not intended to replace advice given to you by your health care provider. Make sure you discuss any questions you have with your health care provider. Document Released: 09/06/2009 Document Revised: 05/30/2016 Document Reviewed: 01/04/2016 Elsevier Interactive Patient Education  2019 [employer]    Nausea, Adult Nausea is feeling sick to your stomach or feeling that you are about to throw up (vomit). Feeling sick to your stomach is usually not serious, but it may be an early sign of a more serious medical problem. As you feel sicker to your stomach, you may throw up. If you throw up, or if you are not able to drink enough fluids, there is a risk that you may lose too much water in your body (get dehydrated). If you lose too much water in your body, you may:  Feel tired.  Feel thirsty.  Have a dry mouth.  Have cracked lips.  Go pee (urinate) less often. Older adults and people who have other diseases or a weak body defense system (immune system) have a higher risk of losing too much water in the body. The main goals of treating this condition are:  To relieve your nausea.  To ensure your nausea occurs less often.  To prevent throwing up and losing too much fluid. Follow these instructions at home: Watch your symptoms for any changes. Tell your doctor about them. Follow these instructions as told by your doctor. Eating and drinking      Take an ORS (oral rehydration solution). This is a drink that is sold at pharmacies and stores.  Drink clear fluids in small amounts as you are able. These include: ? Water. ? Ice chips. ? Fruit juice that has water added (diluted fruit juice). ? Low-calorie sports drinks.  Eat bland, easy-to-digest foods in small amounts as you are able, such as: ? Bananas. ? Applesauce. ? Rice. ? Low-fat (lean) meats. ? Toast. ? Crackers.  Avoid drinking fluids that have a lot  of sugar or caffeine in them. This includes energy drinks, sports drinks, and soda.  Avoid alcohol.  Avoid spicy or fatty foods. General instructions  Take over-the-counter and prescription medicines only as told by your doctor.  Rest at home while you get better.  Drink enough fluid to keep your pee (urine) pale yellow.  Take slow and deep breaths when you feel sick to your stomach.  Avoid food or things that have strong smells.  Wash your hands often with soap and water. If you cannot use soap and water, use hand sanitizer.  Make sure that all people in your home wash their hands well and often.  Keep all follow-up visits as told by your doctor. This is important. Contact a doctor if:  You feel sicker to your stomach.  You feel sick to your  stomach for more than 2 days.  You throw up.  You are not able to drink fluids without throwing up.  You have new symptoms.  You have a fever.  You have a headache.  You have muscle cramps.  You have a rash.  You have pain while peeing.  You feel light-headed or dizzy. Get help right away if:  You have pain in your chest, neck, arm, or jaw.  You feel very weak or you pass out (faint).  You have throw up that is bright red or looks like coffee grounds.  You have bloody or black poop (stools) or poop that looks like tar.  You have a very bad headache, a stiff neck, or both.  You have very bad pain, cramping, or bloating in your belly (abdomen).  You have trouble breathing or you are breathing very quickly.  Your heart is beating very quickly.  Your skin feels cold and clammy.  You feel confused.  You have signs of losing too much water in your body, such as: ? Dark pee, very little pee, or no pee. ? Cracked lips. ? Dry mouth. ? Sunken eyes. ? Sleepiness. ? Weakness. These symptoms may be an emergency. Do not wait to see if the symptoms will go away. Get medical help right away. Call your local emergency  services (911 in the [place] Do not drive yourself to the hospital. Summary  Nausea is feeling sick to your stomach or feeling that you are about to throw up (vomit).  If you throw up, or if you are not able to drink enough fluids, there is a risk that you may lose too much water in your body (get dehydrated).  Eat and drink what your doctor tells you. Take over-the-counter and prescription medicines only as told by your doctor.  Contact a doctor right away if your symptoms get worse or you have new symptoms.  Keep all follow-up visits as told by your doctor. This is important. This information is not intended to replace advice given to you by your health care provider. Make sure you discuss any questions you have with your health care provider. Document Released: 10/30/2011 Document Revised: 04/20/2018 Document Reviewed: 04/20/2018 Elsevier Interactive Patient Education  2019 [employer]    Diarrhea, Adult Diarrhea is when you pass loose and watery poop (stool) often. Diarrhea can make you feel weak and cause you to lose water in your body (get dehydrated). Losing water in your body can cause you to:  Feel tired and thirsty.  Have a dry mouth.  Go pee (urinate) less often. Diarrhea often lasts 2-3 days. However, it can last longer if it is a sign of something more serious. It is important to treat your diarrhea as told by your doctor. Follow these instructions at home: Eating and drinking     Follow these instructions as told by your doctor:  Take an ORS (oral rehydration solution). This is a drink that helps you replace fluids and minerals your body lost. It is sold at pharmacies and stores.  Drink plenty of fluids, such as: ? Water. ? Ice chips. ? Diluted fruit juice. ? Low-calorie sports drinks. ? Milk, if you want.  Avoid drinking fluids that have a lot of sugar or caffeine in them.  Eat bland, easy-to-digest foods in small amounts as you are able. These foods  include: ? Bananas. ? Applesauce. ? Rice. ? Low-fat (lean) meats. ? Toast. ? Crackers.  Avoid alcohol.  Avoid spicy or fatty  foods.  Medicines  Take over-the-counter and prescription medicines only as told by your doctor.  If you were prescribed an antibiotic medicine, take it as told by your doctor. Do not stop using the antibiotic even if you start to feel better. General instructions   Wash your hands often using soap and water. If soap and water are not available, use a hand sanitizer. Others in your home should wash their hands as well. Hands should be washed: ? After using the toilet or changing a diaper. ? Before preparing, cooking, or serving food. ? While caring for a sick person. ? While visiting someone in a hospital.  Drink enough fluid to keep your pee (urine) pale yellow.  Rest at home while you get better.  Watch your condition for any changes.  Take a warm bath to help with any burning or pain from having diarrhea.  Keep all follow-up visits as told by your doctor. This is important. Contact a doctor if:  You have a fever.  Your diarrhea gets worse.  You have new symptoms.  You cannot keep fluids down.  You feel light-headed or dizzy.  You have a headache.  You have muscle cramps. Get help right away if:  You have chest pain.  You feel very weak or you pass out (faint).  You have bloody or black poop or poop that looks like tar.  You have very bad pain, cramping, or bloating in your belly (abdomen).  You have trouble breathing or you are breathing very quickly.  Your heart is beating very quickly.  Your skin feels cold and clammy.  You feel confused.  You have signs of losing too much water in your body, such as: ? Dark pee, very little pee, or no pee. ? Cracked lips. ? Dry mouth. ? Sunken eyes. ? Sleepiness. ? Weakness. Summary  Diarrhea is when you pass loose and watery poop (stool) often.  Diarrhea can make you feel  weak and cause you to lose water in your body (get dehydrated).  Take an ORS (oral rehydration solution). This is a drink that is sold at pharmacies and stores.  Eat bland, easy-to-digest foods in small amounts as you are able.  Contact a doctor if your condition gets worse. Get help right away if you have signs that you have lost too much water in your body. This information is not intended to replace advice given to you by your health care provider. Make sure you discuss any questions you have with your health care provider. Document Released: 04/28/2008 Document Revised: 04/16/2018 Document Reviewed: 04/16/2018 Elsevier Interactive Patient Education  2019 [employer].

## 2019-04-21 ENCOUNTER — Telehealth: Payer: Self-pay | Admitting: Nurse Practitioner

## 2019-04-21 NOTE — Telephone Encounter (Signed)
No los per 5/27. °

## 2019-04-23 ENCOUNTER — Emergency Department (HOSPITAL_COMMUNITY)
Admission: EM | Admit: 2019-04-23 | Discharge: 2019-04-23 | Disposition: A | Discharge status: Home / Self Care | Payer: Medicare HMO | Attending: Emergency Medicine | Admitting: Emergency Medicine

## 2019-04-23 ENCOUNTER — Other Ambulatory Visit: Payer: Self-pay

## 2019-04-23 ENCOUNTER — Encounter (HOSPITAL_COMMUNITY): Payer: Self-pay | Admitting: Emergency Medicine

## 2019-04-23 DIAGNOSIS — E039 Hypothyroidism, unspecified: Secondary | ICD-10-CM | POA: Diagnosis not present

## 2019-04-23 DIAGNOSIS — I10 Essential (primary) hypertension: Secondary | ICD-10-CM | POA: Insufficient documentation

## 2019-04-23 DIAGNOSIS — C250 Malignant neoplasm of head of pancreas: Secondary | ICD-10-CM | POA: Insufficient documentation

## 2019-04-23 DIAGNOSIS — Z853 Personal history of malignant neoplasm of breast: Secondary | ICD-10-CM | POA: Diagnosis not present

## 2019-04-23 DIAGNOSIS — T451X5A Adverse effect of antineoplastic and immunosuppressive drugs, initial encounter: Secondary | ICD-10-CM | POA: Diagnosis not present

## 2019-04-23 DIAGNOSIS — Z79899 Other long term (current) drug therapy: Secondary | ICD-10-CM | POA: Diagnosis not present

## 2019-04-23 DIAGNOSIS — R112 Nausea with vomiting, unspecified: Secondary | ICD-10-CM | POA: Insufficient documentation

## 2019-04-23 DIAGNOSIS — K521 Toxic gastroenteritis and colitis: Secondary | ICD-10-CM

## 2019-04-23 DIAGNOSIS — R197 Diarrhea, unspecified: Secondary | ICD-10-CM | POA: Insufficient documentation

## 2019-04-23 LAB — COMPREHENSIVE METABOLIC PANEL
ALT: 44 U/L (ref 0–44)
AST: 33 U/L (ref 15–41)
Albumin: 3.6 g/dL (ref 3.5–5.0)
Alkaline Phosphatase: 166 U/L — ABNORMAL HIGH (ref 38–126)
Anion gap: 12 (ref 5–15)
BUN: 5 mg/dL — ABNORMAL LOW (ref 8–23)
CO2: 20 mmol/L — ABNORMAL LOW (ref 22–32)
Calcium: 8.9 mg/dL (ref 8.9–10.3)
Chloride: 105 mmol/L (ref 98–111)
Creatinine, Ser: 0.63 mg/dL (ref 0.44–1.00)
GFR calc Af Amer: >60 mL/min (ref >60)
GFR calc non Af Amer: >60 mL/min (ref >60)
Glucose, Bld: 118 mg/dL — ABNORMAL HIGH (ref 70–99)
Potassium: 3.3 mmol/L — ABNORMAL LOW (ref 3.5–5.1)
Sodium: 137 mmol/L (ref 135–145)
Total Bilirubin: 1.8 mg/dL — ABNORMAL HIGH (ref 0.3–1.2)
Total Protein: 6.7 g/dL (ref 6.5–8.1)

## 2019-04-23 LAB — CBC WITH DIFFERENTIAL/PLATELET
Abs Immature Granulocytes: 0.01 10*3/uL (ref 0.00–0.07)
Basophils Absolute: 0 10*3/uL (ref 0.0–0.1)
Basophils Relative: 0 %
Eosinophils Absolute: 0.3 10*3/uL (ref 0.0–0.5)
Eosinophils Relative: 7 %
HCT: 35.2 % — ABNORMAL LOW (ref 36.0–46.0)
Hemoglobin: 11.5 g/dL — ABNORMAL LOW (ref 12.0–15.0)
Immature Granulocytes: 0 %
Lymphocytes Relative: 14 %
Lymphs Abs: 0.6 10*3/uL — ABNORMAL LOW (ref 0.7–4.0)
MCH: 31.3 pg (ref 26.0–34.0)
MCHC: 32.7 g/dL (ref 30.0–36.0)
MCV: 95.7 fL (ref 80.0–100.0)
Monocytes Absolute: 0.5 10*3/uL (ref 0.1–1.0)
Monocytes Relative: 10 %
Neutro Abs: 3.2 10*3/uL (ref 1.7–7.7)
Neutrophils Relative %: 69 %
Platelets: 294 10*3/uL (ref 150–400)
RBC: 3.68 MIL/uL — ABNORMAL LOW (ref 3.87–5.11)
RDW: 17 % — ABNORMAL HIGH (ref 11.5–15.5)
WBC: 4.6 10*3/uL (ref 4.0–10.5)
nRBC: 0 % (ref 0.0–0.2)

## 2019-04-23 LAB — URINALYSIS, ROUTINE W REFLEX MICROSCOPIC
Bilirubin Urine: NEGATIVE
Glucose, UA: NEGATIVE mg/dL
Hgb urine dipstick: NEGATIVE
Ketones, ur: NEGATIVE mg/dL
Leukocytes,Ua: NEGATIVE
Nitrite: NEGATIVE
Protein, ur: NEGATIVE mg/dL
Specific Gravity, Urine: 1.013 (ref 1.005–1.030)
pH: 5 (ref 5.0–8.0)

## 2019-04-23 LAB — LIPASE, BLOOD: Lipase: 156 U/L — ABNORMAL HIGH (ref 11–51)

## 2019-04-23 MED ORDER — ONDANSETRON HCL 4 MG/2ML IJ SOLN: 4.0000 mg | Freq: Once | INTRAMUSCULAR | Status: DC

## 2019-04-23 MED ORDER — SODIUM CHLORIDE 0.9% FLUSH: 10.0000 mL | INTRAVENOUS | Status: DC | PRN

## 2019-04-23 MED ORDER — HEPARIN SOD (PORK) LOCK FLUSH 100 UNIT/ML IV SOLN
500.0000 [IU] | Freq: Once | INTRAVENOUS | Status: AC
  Administered 2019-04-23: 10:00:00 500 [IU]
  Filled 2019-04-23: qty 5

## 2019-04-23 MED ORDER — SODIUM CHLORIDE 0.9 % IV BOLUS
1000.0000 mL | Freq: Once | INTRAVENOUS | Status: AC
  Administered 2019-04-23: 05:00:00 1000 mL via INTRAVENOUS

## 2019-04-23 NOTE — Discharge Instructions (Signed)
You were seen in the ED for nausea and diarrhea; this is likely due to your chemotherapy. Please follow up with the [facility] regarding your ED visit today. Please also take the medications prescribed for your nausea and diarrhea.

## 2019-04-23 NOTE — ED Triage Notes (Signed)
Patient BIB [facility] from home c/o nausea and diarrhea x2 days vomiting yesterday but none today. Pt has pancreatic CA, pt reports last treatment was last week. Pt also c/o LLQ and RLQ abdominal pain, pt reports trying to take her  nausea meds but hasn't been able to keep them down or they have not been working.

## 2019-04-23 NOTE — ED Notes (Signed)
Bed: WA20 Expected date:  Expected time:  Means of arrival:  Comments: EMS 73 yo female pancreatic cancer abd pain

## 2019-04-23 NOTE — ED Provider Notes (Signed)
Care assumed from [facility], PA-C, at shift change, please see their notes for full documentation of patient's complaint/HPI. Briefly, pt here with nausea and diarrhea s/p chemo tx last week. Results so far show mildly hypokalemic at 3.3, elevated alk phos 166, t bili 1.8, and lipase 156. All likely related to pt's pancreatic cancer diagnosis. No abdominal pain; non tender on exam. Awaiting urinalysis to ensure no UTI. Plan is to discharge home after urinalysis collected.   Physical Exam  BP 138/80   Pulse 78   Temp 98.3 F (36.8 C) (Oral)   Resp 16   Ht '5\' 4"'$  (1.626 m)   Wt 59 kg   SpO2 98%   BMI 22.31 kg/m   Physical Exam Vitals signs and nursing note reviewed.  Constitutional:      Appearance: She is not ill-appearing.  HENT:     Head: Normocephalic and atraumatic.  Eyes:     Conjunctiva/sclera: Conjunctivae normal.  Cardiovascular:     Rate and Rhythm: Normal rate and regular rhythm.  Pulmonary:     Effort: Pulmonary effort is normal.     Breath sounds: Normal breath sounds.  Abdominal:     General: Abdomen is flat.     Tenderness: There is no abdominal tenderness.  Skin:    General: Skin is warm and dry.     Coloration: Skin is not jaundiced.  Neurological:     Mental Status: She is alert.      ED Course/Procedures   Clinical Course as of Apr 22 701  Sat Apr 23, 2019  0300 On repeat exam, abdomen remains soft and nontender   [HM]  0421 Pt has been sipping sprite without difficulty.     [HM]  0630 Pt continues to tolerate PO liquids without difficulty.    [HM]  0630 Replacement given  Potassium(!): 3.3 [HM]  0630 Improved  Total Bilirubin(!): 1.8 [HM]  0631 Mild anemia, baseline  Hemoglobin(!): 11.5 [HM]  8473 Abd continues to be soft and nontedner   [HM]  0856 Slightly more elevated from initial presentation.  No epigastric abd pain today.    Lipase(!): 156 [HM]  0646 No tachycardia  Pulse Rate: 78 [HM]  0646 Pending UA.     [HM]     Clinical Course User Index [HM] Muthersbaugh, Jarrett Soho, PA-C    Urinalysis without signs of infection; will discharge home. Advised patient to take her Zofran and Compazine as needed for nausea. She also has imodium at home for diarrhea. Advised to follow up with [facility] regarding ED visit today; pt is in agreement with plan at this time and stable for discharge home.   MDM Number of Diagnoses or Management Options Chemotherapy induced diarrhea:  Chemotherapy induced nausea and vomiting:       Eustaquio Maize, PA-C 04/23/19 1525    Veryl Speak, MD 04/24/19 [phone]

## 2019-04-23 NOTE — ED Provider Notes (Signed)
[facility] DEPT Provider Note   CSN: [id number] Arrival date & time: 04/23/19  0116    History   Chief Complaint Chief Complaint  Patient presents with  . Nausea  . Diarrhea    HPI Julie Rollins is a 73 y.o. female with a hx of anxiety, breast cancer (treated with lumpectomy and radiation in 2000), hypertension, hypothyroid, recently diagnosed pancreatic cancer presents to the Emergency Department complaining of gradual, persistent, progressively worsening aminal pain with associated nausea, vomiting and diarrhea onset 2 days ago.  Patient reports her abdominal pain is lower abdomen in nature, cramping and worse just before episode of diarrhea.  She reports abdominal pain improved significantly and sometimes resolves after diarrhea.  She has no abdominal pain at this time.  Patient states episode of vomiting Thursday night but no vomiting Friday during the day.  She reports primary concern was persistent diarrhea throughout Friday.  Patient reports attempting to take her antiemetics without relief.  Nothing seems to make the symptoms worse.  Pt denies headache, neck pain, chest pain, shortness of breath, syncope, dysuria, hematuria.  Patient does report associated generalized weakness.  Patient reports 4 doses of Imodium without relief of her diarrhea.  Record review shows patient is currently undergoing chemotherapy.  Her last chemotherapy infusion was 04/13/2019.  Appears the patient has had some significant chemotherapy-induced nausea and vomiting.  She has been prescribed Zofran and Compazine.  She was given IV fluids and Decadron on 04/20/2019 for this.  She was prescribed Imodium and Lomotil for her diarrhea.      The history is provided by the patient and medical records. No language interpreter was used.    Past Medical History:  Diagnosis Date  . Anxiety   . Blood transfusion without reported diagnosis   . Breast CA ([place])    s/p lumpectomy and  radiation 2000  . Breast cancer ([place])   . Colitis    2007  . Hernia, epigastric   . HTN (hypertension)    "mild hypertension", was on BP med years ago but it caused orthostatic hypotension and she has not been medicated since  . Hypothyroid   . Personal history of radiation therapy 2001  . Vertigo   . Vertigo     Patient Active Problem List   Diagnosis Date Noted  . Malignant neoplasm of head of pancreas ([place]) 04/04/2019  . Pancreatic mass 03/28/2019  . Elevated LFTs 03/28/2019  . Hypothyroidism 03/28/2019  . Diarrhea 03/28/2019  . Generalized weakness   . Jaundice   . Abnormal finding on GI tract imaging   . Elevated alkaline phosphatase level   . Cervical spine fracture ([place]) 07/31/2018    Past Surgical History:  Procedure Laterality Date  . ABDOMINAL HYSTERECTOMY     22yrs ago  . ANTERIOR CERVICAL DECOMP/DISCECTOMY FUSION N/A 08/02/2018   Procedure: ANTERIOR CERVICAL DECOMPRESSION/DISCECTOMY FUSION CERVICAL FIVE- CERVICAL SIX;  Surgeon: Earnie Larsson, MD;  Location: [facility];  Service: Neurosurgery;  Laterality: N/A;  ANTERIOR CERVICAL DECOMPRESSION/DISCECTOMY FUSION CERVICAL FIVE- CERVICAL SIX  . BILIARY BRUSHING  03/29/2019   Procedure: BILIARY BRUSHING;  Surgeon: [name], MD;  Location: [facility] ENDOSCOPY;  Service: Endoscopy;;  . BILIARY STENT PLACEMENT  03/29/2019   Procedure: BILIARY STENT PLACEMENT;  Surgeon: [name], MD;  Location: [facility] ENDOSCOPY;  Service: Endoscopy;;  . BIOPSY  03/29/2019   Procedure: BIOPSY;  Surgeon: [name], MD;  Location: [facility] ENDOSCOPY;  Service: Endoscopy;;  . BREAST LUMPECTOMY  2000  . ERCP N/A 03/29/2019   Procedure: ENDOSCOPIC RETROGRADE CHOLANGIOPANCREATOGRAPHY (ERCP);  Surgeon: [name], MD;  Location: [facility] ENDOSCOPY;  Service: Endoscopy;  Laterality: N/A;  . ESOPHAGOGASTRODUODENOSCOPY (EGD) WITH PROPOFOL N/A 03/29/2019   Procedure: ESOPHAGOGASTRODUODENOSCOPY (EGD) WITH PROPOFOL;  Surgeon: [name], MD;  Location:  [facility] ENDOSCOPY;  Service: Endoscopy;  Laterality: N/A;  . EUS N/A 03/29/2019   Procedure: UPPER ENDOSCOPIC ULTRASOUND (EUS) LINEAR;  Surgeon: [name], MD;  Location: [facility] ENDOSCOPY;  Service: Endoscopy;  Laterality: N/A;  . FINE NEEDLE ASPIRATION  03/29/2019   Procedure: FINE NEEDLE ASPIRATION (FNA);  Surgeon: [name], MD;  Location: [facility] ENDOSCOPY;  Service: Endoscopy;;  . HERNIA REPAIR     20+ years ago  . PORTACATH PLACEMENT N/A 04/12/2019   Procedure: INSERTION PORT-A-CATH LEFT SUBCLAVIAN;  Surgeon: Stark Klein, MD;  Location: [facility];  Service: General;  Laterality: N/A;  . RETINAL DETACHMENT SURGERY    . SPHINCTEROTOMY  03/29/2019   Procedure: SPHINCTEROTOMY;  Surgeon: [name], MD;  Location: [facility] ENDOSCOPY;  Service: Endoscopy;;     OB History   No obstetric history on file.      Home Medications    Prior to Admission medications   Medication Sig Start Date End Date Taking? Authorizing Provider  acetaminophen (TYLENOL) 500 MG tablet Take 1,000 mg by mouth every 6 (six) hours as needed.   Yes [provider]  citalopram (CELEXA) 20 MG tablet Take 20 mg by mouth at bedtime.   Yes [provider]  diltiazem (CARDIZEM CD) 180 MG 24 hr capsule Take 180 mg by mouth daily. 05/18/18  Yes [provider]  levothyroxine (SYNTHROID, LEVOTHROID) 50 MCG tablet Take 50 mcg by mouth every morning.   Yes [provider]  ondansetron (ZOFRAN) 8 MG tablet Take 1 tablet (8 mg total) by mouth 2 (two) times daily as needed for refractory nausea / vomiting. Start on day 3 after chemotherapy. 04/04/19  Yes Truitt Merle, MD  pantoprazole (PROTONIX) 20 MG tablet Take 1 tablet (20 mg total) by mouth 2 (two) times daily before a meal. 03/30/19 03/29/20 Yes [place], Suann Larry, MD  prochlorperazine (COMPAZINE) 10 MG tablet Take 1 tablet (10 mg total) by mouth every 6 (six) hours as needed (NAUSEA). 04/04/19  Yes Truitt Merle, MD  acetaminophen  (TYLENOL) 325 MG tablet Take 2 tablets (650 mg total) by mouth every 8 (eight) hours. Patient not taking: Reported on 04/23/2019 08/05/18   Wellington Hampshire, PA-C  HYDROcodone-acetaminophen (NORCO/VICODIN) 5-325 MG tablet Take 0.5-1 tablets by mouth every 6 (six) hours as needed for moderate pain or severe pain. 04/12/19   Stark Klein, MD  lidocaine-prilocaine (EMLA) cream Apply to affected area once Patient taking differently: Apply 1 application topically daily as needed. port 04/04/19   Truitt Merle, MD    Family History Family History  Problem Relation Age of Onset  . Bladder Cancer Brother   . Other Other        Denies family h/o cardiac disease    Social History Social History   Tobacco Use  . Smoking status: Never Smoker  . Smokeless tobacco: Never Used  Substance Use Topics  . Alcohol use: No  . Drug use: No     Allergies   Codeine   Review of Systems Review of Systems  Constitutional: Positive for appetite change. Negative for diaphoresis, fatigue, fever and unexpected weight change.  HENT: Negative for mouth sores.   Eyes: Negative  for visual disturbance.  Respiratory: Negative for cough, chest tightness, shortness of breath and wheezing.   Cardiovascular: Negative for chest pain.  Gastrointestinal: Positive for abdominal pain, diarrhea, nausea and vomiting. Negative for constipation.  Endocrine: Negative for polydipsia, polyphagia and polyuria.  Genitourinary: Negative for dysuria, frequency, hematuria and urgency.  Musculoskeletal: Negative for back pain and neck stiffness.  Skin: Negative for rash.  Allergic/Immunologic: Negative for immunocompromised state.  Neurological: Negative for syncope, light-headedness and headaches.  Hematological: Does not bruise/bleed easily.  Psychiatric/Behavioral: Negative for sleep disturbance. The patient is not nervous/anxious.      Physical Exam Updated Vital Signs BP 133/75 (BP Location: Right Arm)   Pulse 88   Temp  98.3 F (36.8 C) (Oral)   Resp (!) 22   Ht 5\' 4"  (1.626 m)   Wt 59 kg   SpO2 98%   BMI 22.31 kg/m   Physical Exam Vitals signs and nursing note reviewed.  Constitutional:      General: She is not in acute distress.    Appearance: She is not diaphoretic.  HENT:     Head: Normocephalic.  Eyes:     General: No scleral icterus.    Conjunctiva/sclera: Conjunctivae normal.  Neck:     Musculoskeletal: Normal range of motion.  Cardiovascular:     Rate and Rhythm: Normal rate and regular rhythm.     Pulses: Normal pulses.          Radial pulses are 2+ on the right side and 2+ on the left side.  Pulmonary:     Effort: No tachypnea, accessory muscle usage, prolonged expiration, respiratory distress or retractions.     Breath sounds: No stridor.     Comments: Equal chest rise. No increased work of breathing. Abdominal:     General: There is no distension.     Palpations: Abdomen is soft.     Tenderness: There is no abdominal tenderness. There is no guarding or rebound.  Musculoskeletal:     Comments: Moves all extremities equally and without difficulty.  Skin:    General: Skin is warm and dry.     Capillary Refill: Capillary refill takes less than 2 seconds.  Neurological:     Mental Status: She is alert.     GCS: GCS eye subscore is 4. GCS verbal subscore is 5. GCS motor subscore is 6.     Comments: Speech is clear and goal oriented.  Psychiatric:        Mood and Affect: Mood normal.      ED Treatments / Results  Labs (all labs ordered are listed, but only abnormal results are displayed) Labs Reviewed  CBC WITH DIFFERENTIAL/PLATELET - Abnormal; Notable for the following components:      Result Value   RBC 3.68 (*)    Hemoglobin 11.5 (*)    HCT 35.2 (*)    RDW 17.0 (*)    Lymphs Abs 0.6 (*)    All other components within normal limits  COMPREHENSIVE METABOLIC PANEL - Abnormal; Notable for the following components:   Potassium 3.3 (*)    CO2 20 (*)    Glucose, Bld  118 (*)    BUN 5 (*)    Alkaline Phosphatase 166 (*)    Total Bilirubin 1.8 (*)    All other components within normal limits  LIPASE, BLOOD - Abnormal; Notable for the following components:   Lipase 156 (*)    All other components within normal limits  URINALYSIS, ROUTINE W REFLEX MICROSCOPIC  EKG EKG Interpretation  Date/Time:  Saturday Apr 23 2019 03:08:03 EDT Ventricular Rate:  79 PR Interval:    QRS Duration: 142 QT Interval:  423 QTC Calculation: 485 R Axis:   -14 Text Interpretation:  Sinus rhythm Left bundle branch block No significant change was found Confirmed by Shanon Rosser [phone]) on 04/23/2019 3:14:15 AM   Procedures Procedures (including critical care time)  Medications Ordered in ED Medications  ondansetron (ZOFRAN) injection 4 mg (has no administration in time range)  sodium chloride flush (NS) 0.9 % injection 10-40 mL (has no administration in time range)  sodium chloride 0.9 % bolus 1,000 mL (1,000 mLs Intravenous New Bag/Given 04/23/19 0459)     Initial Impression / Assessment and Plan / ED Course  I have reviewed the triage vital signs and the nursing notes.  Pertinent labs & imaging results that were available during my care of the patient were reviewed by me and considered in my medical decision making (see chart for details).  Clinical Course as of Apr 22 704  Sat Apr 23, 2019  0300 On repeat exam, abdomen remains soft and nontender   [HM]  0421 Pt has been sipping sprite without difficulty.     [HM]  0630 Pt continues to tolerate PO liquids without difficulty.    [HM]  0630 Replacement given  Potassium(!): 3.3 [HM]  0630 Improved  Total Bilirubin(!): 1.8 [HM]  0631 Mild anemia, baseline  Hemoglobin(!): 11.5 [HM]  5409 Abd continues to be soft and nontedner   [HM]  8119 Slightly more elevated from initial presentation.  No epigastric abd pain today.    Lipase(!): 156 [HM]  0646 No tachycardia  Pulse Rate: 78 [HM]  0646 Pending UA.      [HM]    Clinical Course User Index [HM] Jolee Critcher, Jarrett Soho, PA-C       Orthostatic VS for the past 24 hrs:  BP- Lying Pulse- Lying BP- Sitting Pulse- Sitting BP- Standing at 0 minutes Pulse- Standing at 0 minutes  04/23/19 0340 138/71 78 138/80 92 127/76 95    Presents with nausea, vomiting and diarrhea.  Her abdominal pain has been intermittent and associated with diarrhea.  Given is soft and nontender on my exam.  I suspect her abdominal cramping is secondary to her diarrhea.  No vomiting in the last 24 hours but patient does have severe nausea and reports this is preventing her from eating.  Will check labs, orthostasis, give fluids and antiemetics.   7:02 AM Is well-appearing.  Labs are reassuring.  She is without orthostatic hypotension.  She has no tachycardia here.  She is afebrile.  No hypoxia.  Mild hypokalemia was noted and replaced here in the emergency department.  Mild anemia appears to be baseline.  Lipase is slightly elevated however patient has no epigastric abdominal pain.  Pending urinalysis.  Fluids given and patient is tolerating p.o.  Plan for discharge home after urinalysis.  At shift change care was transferred to [facility], PA-C who will follow pending studies, re-evaulate and determine disposition.     Final Clinical Impressions(s) / ED Diagnoses   Final diagnoses:  Chemotherapy induced diarrhea  Chemotherapy induced nausea and vomiting    ED Discharge Orders    None       Agapito Games 04/23/19 1478    Molpus, Jenny Reichmann, MD 04/23/19 [phone]

## 2019-04-25 ENCOUNTER — Telehealth: Payer: Self-pay

## 2019-04-25 NOTE — Telephone Encounter (Signed)
Left voice message on patient's cell phone as a follow up from her ED visit over the weekend.  Inquiring about whether she is feeling better.  I asked that she call me back.

## 2019-04-25 NOTE — Progress Notes (Signed)
Julie Rollins   [phone] [phone]) [phone]   Clinic Follow up Note   Patient Care Team: Nolene Ebbs, MD as PCP - General (Internal Medicine) Stark Klein, MD as Consulting Physician (General Surgery) Truitt Merle, MD as Consulting Physician (Hematology) Alla Feeling, NP as Nurse Practitioner (Nurse Practitioner) Arna Snipe, RN as Oncology Nurse Navigator  Date of Service:  04/27/2019  CHIEF COMPLAINT: F/u of pancreatic cancer   SUMMARY OF ONCOLOGIC HISTORY:   Malignant neoplasm of head of pancreas ([place])   03/28/2019 Imaging    CT AP w contrast IMPRESSION: Interval development of 20 x 14 mm ill-defined low density in pancreatic head concerning for possible neoplasm or malignancy. This appears to be resulting in severe intrahepatic and extrahepatic biliary dilatation. ERCP is recommended for further evaluation.  Sigmoid diverticulosis without inflammation.    03/29/2019 Initial Biopsy    Diagnosis FINE NEEDLE ASPIRATION, ENDOSCOPIC EGUS, PANCREATIC HEAD MASS (SPECIMEN 1 OF 2 COLLECTED 03/29/2019) MALIGNANT CELLS CONSISTENT WITH ADENOCARCINOMA.    03/29/2019 Initial Biopsy    Diagnosis Duodenum, Biopsy, abnormal mucosa at major papilla - ADENOCARCINOMA. SEE NOTE Diagnosis Note The duodenal mucosa is negative for dysplasia. The findings are consistent with duodenal involvement from patient's suspected primary pancreatic adenocarcinoma. Dr. Melina Copa has reviewed this case and concurs with the above interpretation. Dr. Ardis Hughs was notified on 03/30/2019.    03/29/2019 Pathology Results    Diagnosis BILE DUCT BRUSHING (SPECIMEN 2 OF 2, COLLECTED ON 03/29/2019): HIGHLY SUSPICIOUS FOR MALIGNANCY.    03/29/2019 Procedure    ERCP with stenting of malignant distal CBD stricture per Dr. Ardis Hughs    04/04/2019 Initial Diagnosis    Malignant neoplasm of head of pancreas ([place])    04/08/2019 Imaging    CT Chest IMPRESSION: No evidence of metastatic disease in the  chest. Aortic Atherosclerosis (ICD10-I70.0).     04/13/2019 -  Chemotherapy    neoadjuvant FOLFIRINOX q3weeks beginning 04/13/19; dose-reduced with cycle 1      CURRENT THERAPY:  neoadjuvant FOLFIRINOX q3weeks beginning 04/13/19; dose-reduced with cycle 1  INTERVAL HISTORY:  Julie Rollins is here for a follow up and treatment. She presents to the clinic alone.  She notes she has been having diarrhea last week and went to ED on 5/29 for IV Fluids. Imodium did not help her. Yesterday and today she has not had a BMs. She feels she is eating better this week. She notes she will have Gas and pain from this occasionally. She did not take anything for her pain. She only had 1 episode of emesis from what she ate. She has not needed anti-emetics since last week. She notes her urine color has improved and lighter. She denies any cold sensitivity. She notes she does not like water but will drink sprite and V8 tomato juice.  She notes she has been having issues with transportation. She called transportation today but did not indicate time of pickup.    REVIEW OF SYSTEMS:   Constitutional: Denies fevers, chills or abnormal weight loss Eyes: Denies blurriness of vision Ears, nose, mouth, throat, and face: Denies mucositis or sore throat Respiratory: Denies cough, dyspnea or wheezes Cardiovascular: Denies palpitation, chest discomfort or lower extremity swelling Gastrointestinal:  Denies nausea, heartburn (+) gas and bloating  UA: (+) Improved urine color Skin: Denies abnormal skin rashes Lymphatics: Denies new lymphadenopathy or easy bruising Neurological:Denies numbness, tingling or new weaknesses Behavioral/Psych: Mood is stable, no new changes  All other systems were reviewed with the patient and are  negative.  MEDICAL HISTORY:  Past Medical History:  Diagnosis Date  . Anxiety   . Blood transfusion without reported diagnosis   . Breast CA ([place])    s/p lumpectomy and radiation 2000  .  Breast cancer ([place])   . Colitis    2007  . Hernia, epigastric   . HTN (hypertension)    "mild hypertension", was on BP med years ago but it caused orthostatic hypotension and she has not been medicated since  . Hypothyroid   . Personal history of radiation therapy 2001  . Vertigo   . Vertigo     SURGICAL HISTORY: Past Surgical History:  Procedure Laterality Date  . ABDOMINAL HYSTERECTOMY     40yr ago  . ANTERIOR CERVICAL DECOMP/DISCECTOMY FUSION N/A 08/02/2018   Procedure: ANTERIOR CERVICAL DECOMPRESSION/DISCECTOMY FUSION CERVICAL FIVE- CERVICAL SIX;  Surgeon: PEarnie Larsson MD;  [facility]  Service: Neurosurgery;  Laterality: N/A;  ANTERIOR CERVICAL DECOMPRESSION/DISCECTOMY FUSION CERVICAL FIVE- CERVICAL SIX  . BILIARY BRUSHING  03/29/2019   Procedure: BILIARY BRUSHING;  Surgeon: JMilus Banister MD;  [facility];  Service: Endoscopy;;  . BILIARY STENT PLACEMENT  03/29/2019   Procedure: BILIARY STENT PLACEMENT;  Surgeon: JMilus Banister MD;  [facility];  Service: Endoscopy;;  . BIOPSY  03/29/2019   Procedure: BIOPSY;  Surgeon: JMilus Banister MD;  [facility];  Service: Endoscopy;;  . BREAST LUMPECTOMY     2000  . ERCP N/A 03/29/2019   Procedure: ENDOSCOPIC RETROGRADE CHOLANGIOPANCREATOGRAPHY (ERCP);  Surgeon: JMilus Banister MD;  [facility];  Service: Endoscopy;  Laterality: N/A;  . ESOPHAGOGASTRODUODENOSCOPY (EGD) WITH PROPOFOL N/A 03/29/2019   Procedure: ESOPHAGOGASTRODUODENOSCOPY (EGD) WITH PROPOFOL;  Surgeon: JMilus Banister MD;  [facility];  Service: Endoscopy;  Laterality: N/A;  . EUS N/A 03/29/2019   Procedure: UPPER ENDOSCOPIC ULTRASOUND (EUS) LINEAR;  Surgeon: JMilus Banister MD;  [facility];  Service: Endoscopy;  Laterality: N/A;  . FINE NEEDLE ASPIRATION  03/29/2019   Procedure: FINE NEEDLE ASPIRATION (FNA);  Surgeon: JMilus Banister MD;  [facility];  Service: Endoscopy;;  . HERNIA  REPAIR     20+ years ago  . PORTACATH PLACEMENT N/A 04/12/2019   Procedure: INSERTION PORT-A-CATH LEFT SUBCLAVIAN;  Surgeon: BStark Klein MD;  [facility]  Service: General;  Laterality: N/A;  . RETINAL DETACHMENT SURGERY    . SPHINCTEROTOMY  03/29/2019   Procedure: SPHINCTEROTOMY;  Surgeon: JMilus Banister MD;  [facility];  Service: Endoscopy;;    I have reviewed the social history and family history with the patient and they are unchanged from previous note.  ALLERGIES:  is allergic to codeine.  MEDICATIONS:  Current Outpatient Medications  Medication Sig Dispense Refill  . citalopram (CELEXA) 20 MG tablet Take 20 mg by mouth at bedtime.    .Marland Kitchendiltiazem (CARDIZEM CD) 180 MG 24 hr capsule Take 180 mg by mouth daily.    .Marland KitchenHYDROcodone-acetaminophen (NORCO/VICODIN) 5-325 MG tablet Take 0.5-1 tablets by mouth every 6 (six) hours as needed for moderate pain or severe pain. 8 tablet 0  . levothyroxine (SYNTHROID, LEVOTHROID) 50 MCG tablet Take 50 mcg by mouth every morning.    . lidocaine-prilocaine (EMLA) cream Apply to affected area once (Patient taking differently: Apply 1 application topically daily as needed. port) 30 g 3  . ondansetron (ZOFRAN) 8 MG tablet Take 1 tablet (8 mg total) by mouth 2 (two) times daily as needed for refractory nausea /  vomiting. Start on day 3 after chemotherapy. 30 tablet 1  . pantoprazole (PROTONIX) 20 MG tablet Take 1 tablet (20 mg total) by mouth 2 (two) times daily before a meal. 60 tablet 2  . prochlorperazine (COMPAZINE) 10 MG tablet Take 1 tablet (10 mg total) by mouth every 6 (six) hours as needed (NAUSEA). 30 tablet 1  . acetaminophen (TYLENOL) 325 MG tablet Take 2 tablets (650 mg total) by mouth every 8 (eight) hours. (Patient not taking: Reported on 04/23/2019)    . acetaminophen (TYLENOL) 500 MG tablet Take 1,000 mg by mouth every 6 (six) hours as needed.    . diphenoxylate-atropine (LOMOTIL) 2.5-0.025 MG tablet  Take 2 tablets by mouth 4 (four) times daily as needed for diarrhea or loose stools. 60 tablet 1  . potassium chloride SA (K-DUR) 20 MEQ tablet Take 1 tablet (20 mEq total) by mouth daily. 30 tablet 1   No current facility-administered medications for this visit.    Facility-Administered Medications Ordered in Other Visits  Medication Dose Route Frequency Provider Last Rate Last Dose  . fluorouracil (ADRUCIL) 4,000 mg in sodium chloride 0.9 % 70 mL chemo infusion  2,400 mg/m2 (Treatment Plan Recorded) Intravenous 1 day or 1 dose Truitt Merle, MD      . irinotecan (CAMPTOSAR) 160 mg in dextrose 5 % 500 mL chemo infusion  100 mg/m2 (Treatment Plan Recorded) Intravenous Once Truitt Merle, MD 339 mL/hr at 04/27/19 1611 160 mg at 04/27/19 1611  . leucovorin 664 mg in dextrose 5 % 250 mL infusion  400 mg/m2 (Treatment Plan Recorded) Intravenous Once Truitt Merle, MD 189 mL/hr at 04/27/19 1609 664 mg at 04/27/19 1609    PHYSICAL EXAMINATION: ECOG PERFORMANCE STATUS: 1 - Symptomatic but completely ambulatory  Vitals:   04/27/19 1047  BP: 136/63  Pulse: 82  Resp: 18  Temp: 98.5 F (36.9 C)  SpO2: 99%   Filed Weights   04/27/19 1047  Weight: 129 lb 14.4 oz (58.9 kg)    GENERAL:alert, no distress and comfortable SKIN: skin color, texture, turgor are normal, no rashes or significant lesions EYES: normal, Conjunctiva are pink and non-injected, sclera clear  NECK: supple, thyroid normal size, non-tender, without nodularity LYMPH:  no palpable lymphadenopathy in the cervical, axillary  LUNGS: clear to auscultation and percussion with normal breathing effort HEART: regular rate & rhythm and no murmurs and no lower extremity edema ABDOMEN:abdomen soft, non-tender and normal bowel sounds Musculoskeletal:no cyanosis of digits and no clubbing  NEURO: alert & oriented x 3 with fluent speech, no focal motor/sensory deficits  LABORATORY DATA:  I have reviewed the data as listed CBC Latest Ref Rng & Units  04/27/2019 04/23/2019 04/20/2019  WBC 4.0 - 10.5 K/uL 3.3(L) 4.6 5.5  Hemoglobin 12.0 - 15.0 g/dL 11.2(L) 11.5(L) 12.1  Hematocrit 36.0 - 46.0 % 33.3(L) 35.2(L) 35.9(L)  Platelets 150 - 400 K/uL 299 294 317     CMP Latest Ref Rng & Units 04/27/2019 04/23/2019 04/20/2019  Glucose 70 - 99 mg/dL 108(H) 118(H) 123(H)  BUN 8 - 23 mg/dL 9 5(L) 10  Creatinine 0.44 - 1.00 mg/dL 0.65 0.63 0.76  Sodium 135 - 145 mmol/L 140 137 134(L)  Potassium 3.5 - 5.1 mmol/L 3.2(L) 3.3(L) 3.6  Chloride 98 - 111 mmol/L 102 105 101  CO2 22 - 32 mmol/L 28 20(L) 23  Calcium 8.9 - 10.3 mg/dL 8.5(L) 8.9 9.2  Total Protein 6.5 - 8.1 g/dL 5.8(L) 6.7 7.2  Total Bilirubin 0.3 - 1.2 mg/dL 1.2 1.8(H) 2.0(H)  Alkaline Phos 38 - 126 U/L 155(H) 166(H) 219(H)  AST 15 - 41 U/L 20 33 28  ALT 0 - 44 U/L 29 44 46(H)      RADIOGRAPHIC STUDIES: I have personally reviewed the radiological images as listed and agreed with the findings in the report. No results found.   ASSESSMENT & PLAN:  Julie Rollins is a 73 y.o. female with   1. Adenocarcinoma of pancreatic head, cT2N0M0, with duodenal involvement   -diagnosed 03/2019, presented with painless juandice s/p ERCP with stenting (bili 23.9).  -She completed staging workup with CT chest which was negative for metastatic disease, I reviewed this with her.  -She started neoadjuvant FOLFIRINOX on 04/13/19. She tolerating first cycle moderately well wit N&V and diarrhea. She had ED visit for that. Recovered well this week. I again reviewed management of diarrhea and called in Lomotil today.  -Physical exam unremarkable today. Labs reviewed, CBC and CMP WNL except WBC 3.3, Hg 11.2, ANC 1.5, potassium 3.2, BG 108, Ca 8.5, protein 5.8, albumin 3, alk Phos 155. CA 19.9 still pending. Overall adequate to proceed with FOLFIRINOX cycle 2 today with increased dose.  -lab, f/u and IVF in one week   2. Obstructive juandice  -this is initially how she presented, bili 23.9 -s/p ERCP with stenting  of the malignant stricture of distal CBD per Dr. Ardis Hughs on 03/29/19  -juandice and urine color is improving, bili normal today at 1.2 (04/27/19)  3. Lower leg edema  -she noticed swollen legs a few days ago, and notes intermittent discomfort in left calf -Doppler 5/22 negative for DVT -She was encouraged her to elevate legs during rest, and wear compression stockings if needed   4. Esophagitis with bleeding- acid related  -found on EGD 03/29/19 per Dr. Ardis Hughs -started on PPI pantoprazole 20 mg BID  5. Genetic Testing  -due to her personal history of breast cancer (DCIS) and pancreas cancer, she is a candidate for genetic testing. She was referred by Dr. Barry Dienes.  -she does not have children. She does have a brother who had bladder cancer -appt on 6/11 via webex  6. H/o left breast DCIS -diagnosed in 2000, s/p lumpectomy and adjuvant radiation, managed by Dr. Margot Chimes -last mammo in 01/2018 negative for evidence of malignancy   7. Hypothyroidism -on Synthroid per PCP   8. Diarrhea, Gas, N&V, hypokalemia -Diarrhea not controlled with Imodium, I will call in Lomotil today (04/27/19) -Emesis seems to be triggered by certain foods. Will continue anti-emetics as needed and watch her food intake.  -I suggest OTC Gas-x -K at 3.2 today along with slightly lower protein and albumin level secondary to diarrhea and emesis. I called in oral potassium today. I also encouraged her to drink plenty of water. (04/27/19)  PLAN: -I called in her Lomotil and oral potassium today  -Labs reviewed and adequate to proceed with cycle 2 FOLFIRINOX today with increased dose oxaliplatin to 85 mg/m, irinotecan to 100 mg/m.  If she tolerates well, will increase irinotecan dose again next cycle -Lab, flush, f/u and IVF next week  -next cycle chemo in 2 weeks   No problem-specific Assessment & Plan notes found for this encounter.   No orders of the defined types were placed in this encounter.  All questions  were answered. The patient knows to call the clinic with any problems, questions or concerns. No barriers to learning was detected. I spent 20 minutes counseling the patient face to face. The total time spent in the  appointment was 25 minutes and more than 50% was on counseling and review of test results     Truitt Merle, MD 04/27/2019   I, Joslyn Devon, am acting as scribe for Truitt Merle, MD.   I have reviewed the above documentation for accuracy and completeness, and I agree with the above.   Addendum -pt developed tingling around her mouth and finger, and throat tightness toward the end of oxaliplatin infusion, vital signs are stable, exam showed no wheezing in her lungs. I gave her 1 dose of Solu-Medrol 60 mg, and wasted the rest of oxaliplatin (about 5%), she feels better, and continued the rest of chemo. I will reduce her oxaliplatin dose to 70 mg/m on next cycle.   Truitt Merle  04/27/2019

## 2019-04-26 ENCOUNTER — Inpatient Hospital Stay: Payer: Medicare HMO | Attending: Nurse Practitioner

## 2019-04-26 DIAGNOSIS — E876 Hypokalemia: Secondary | ICD-10-CM | POA: Insufficient documentation

## 2019-04-26 DIAGNOSIS — C250 Malignant neoplasm of head of pancreas: Secondary | ICD-10-CM | POA: Insufficient documentation

## 2019-04-26 DIAGNOSIS — Z5111 Encounter for antineoplastic chemotherapy: Secondary | ICD-10-CM | POA: Insufficient documentation

## 2019-04-26 DIAGNOSIS — C784 Secondary malignant neoplasm of small intestine: Secondary | ICD-10-CM | POA: Insufficient documentation

## 2019-04-26 DIAGNOSIS — Z452 Encounter for adjustment and management of vascular access device: Secondary | ICD-10-CM | POA: Insufficient documentation

## 2019-04-26 NOTE — Progress Notes (Signed)
Nutrition Follow-up:  RD working remotely.  Patient with pancreatic cancer with duodenal involvement.  Patient receiving neoadjuvant chemotherapy.  Noted patient went to ED on 5/30 for nausea, vomiting and diarrhea.  Spoke with patient via phone.  Patient reports that she is feeling better since going to the ED.  Reports that she has not had any diarrhea or stool this am.  Reports typically has loose stool in the am but last Friday had it continuously during the day and night so she called the ambulance.  Reports that nausea is better.  Drank carnation breakfast essentials this am (vanilla).  Reports she can't handle chocolate right now.  Reports yesterday she wanted her friend to take her to [employer] and she got a taco.  Reports it tasted so good and she ate all of it except 1 bite.  Denies nausea or diarrhea after eating.  Reports for lunch yesterday ate grilled chicken and baked apples.  Has eaten applesauce and jello as well.  Can't tolerate ensure/boost right now.  Reports that she did not like the almond milk that someone told her to try.     Medications: reviewed  Labs: reviewed  Anthropometrics:   Weight 130 lb taken on 5/27 [facility]) decreased from 134 lb on 5/11.     NUTRITION DIAGNOSIS:  Inadequate oral intake related to cancer and cancer related side effects as evidenced by 9% weight loss in 8 months, poor intake due to nausea, vomiting and diarrhea   INTERVENTION:  Encouraged continuing carnation breakfast essentials at this time.   Encouraged taking medications to help with diarrhea and nausea.  Encouraged small frequent meals/snacks Reviewed foods high in protein.      MONITORING, EVALUATION, GOAL: Patient will consume adequate calories and protein to maintain weight during treatment   NEXT VISIT: phone f/u June 23  Lannie Yusuf B. Zenia Resides, [place], [place] Registered Dietitian [phone] (pager)

## 2019-04-27 ENCOUNTER — Inpatient Hospital Stay: Payer: Medicare HMO

## 2019-04-27 ENCOUNTER — Telehealth: Payer: Self-pay | Admitting: *Deleted

## 2019-04-27 ENCOUNTER — Encounter: Payer: Self-pay | Admitting: Hematology

## 2019-04-27 ENCOUNTER — Inpatient Hospital Stay (HOSPITAL_BASED_OUTPATIENT_CLINIC_OR_DEPARTMENT_OTHER): Payer: Medicare HMO | Admitting: Medical

## 2019-04-27 ENCOUNTER — Inpatient Hospital Stay (HOSPITAL_BASED_OUTPATIENT_CLINIC_OR_DEPARTMENT_OTHER): Payer: Medicare HMO | Admitting: Hematology

## 2019-04-27 ENCOUNTER — Other Ambulatory Visit: Payer: Self-pay

## 2019-04-27 VITALS — BP 128/54 | HR 79 | Temp 97.5°F | Resp 18

## 2019-04-27 VITALS — BP 136/63 | HR 82 | Temp 98.5°F | Resp 18 | Ht 64.0 in | Wt 129.9 lb

## 2019-04-27 DIAGNOSIS — K209 Esophagitis, unspecified: Secondary | ICD-10-CM

## 2019-04-27 DIAGNOSIS — Z8052 Family history of malignant neoplasm of bladder: Secondary | ICD-10-CM

## 2019-04-27 DIAGNOSIS — R143 Flatulence: Secondary | ICD-10-CM

## 2019-04-27 DIAGNOSIS — T8090XA Unspecified complication following infusion and therapeutic injection, initial encounter: Secondary | ICD-10-CM

## 2019-04-27 DIAGNOSIS — R112 Nausea with vomiting, unspecified: Secondary | ICD-10-CM | POA: Diagnosis not present

## 2019-04-27 DIAGNOSIS — R600 Localized edema: Secondary | ICD-10-CM

## 2019-04-27 DIAGNOSIS — Z923 Personal history of irradiation: Secondary | ICD-10-CM

## 2019-04-27 DIAGNOSIS — E039 Hypothyroidism, unspecified: Secondary | ICD-10-CM

## 2019-04-27 DIAGNOSIS — Z86000 Personal history of in-situ neoplasm of breast: Secondary | ICD-10-CM

## 2019-04-27 DIAGNOSIS — Z5111 Encounter for antineoplastic chemotherapy: Secondary | ICD-10-CM | POA: Diagnosis not present

## 2019-04-27 DIAGNOSIS — Z452 Encounter for adjustment and management of vascular access device: Secondary | ICD-10-CM | POA: Diagnosis not present

## 2019-04-27 DIAGNOSIS — C250 Malignant neoplasm of head of pancreas: Secondary | ICD-10-CM

## 2019-04-27 DIAGNOSIS — C784 Secondary malignant neoplasm of small intestine: Secondary | ICD-10-CM

## 2019-04-27 DIAGNOSIS — R197 Diarrhea, unspecified: Secondary | ICD-10-CM | POA: Diagnosis not present

## 2019-04-27 DIAGNOSIS — E876 Hypokalemia: Secondary | ICD-10-CM

## 2019-04-27 DIAGNOSIS — Z95828 Presence of other vascular implants and grafts: Secondary | ICD-10-CM

## 2019-04-27 LAB — CBC WITH DIFFERENTIAL (CANCER CENTER ONLY)
Abs Immature Granulocytes: 0.01 10*3/uL (ref 0.00–0.07)
Basophils Absolute: 0.1 10*3/uL (ref 0.0–0.1)
Basophils Relative: 2 %
Eosinophils Absolute: 0.3 10*3/uL (ref 0.0–0.5)
Eosinophils Relative: 9 %
HCT: 33.3 % — ABNORMAL LOW (ref 36.0–46.0)
Hemoglobin: 11.2 g/dL — ABNORMAL LOW (ref 12.0–15.0)
Immature Granulocytes: 0 %
Lymphocytes Relative: 26 %
Lymphs Abs: 0.9 10*3/uL (ref 0.7–4.0)
MCH: 31.3 pg (ref 26.0–34.0)
MCHC: 33.6 g/dL (ref 30.0–36.0)
MCV: 93 fL (ref 80.0–100.0)
Monocytes Absolute: 0.6 10*3/uL (ref 0.1–1.0)
Monocytes Relative: 19 %
Neutro Abs: 1.5 10*3/uL — ABNORMAL LOW (ref 1.7–7.7)
Neutrophils Relative %: 44 %
Platelet Count: 299 10*3/uL (ref 150–400)
RBC: 3.58 MIL/uL — ABNORMAL LOW (ref 3.87–5.11)
RDW: 16.1 % — ABNORMAL HIGH (ref 11.5–15.5)
WBC Count: 3.3 10*3/uL — ABNORMAL LOW (ref 4.0–10.5)
nRBC: 0 % (ref 0.0–0.2)

## 2019-04-27 LAB — CMP (CANCER CENTER ONLY)
ALT: 29 U/L (ref 0–44)
AST: 20 U/L (ref 15–41)
Albumin: 3 g/dL — ABNORMAL LOW (ref 3.5–5.0)
Alkaline Phosphatase: 155 U/L — ABNORMAL HIGH (ref 38–126)
Anion gap: 10 (ref 5–15)
BUN: 9 mg/dL (ref 8–23)
CO2: 28 mmol/L (ref 22–32)
Calcium: 8.5 mg/dL — ABNORMAL LOW (ref 8.9–10.3)
Chloride: 102 mmol/L (ref 98–111)
Creatinine: 0.65 mg/dL (ref 0.44–1.00)
GFR, Est AFR Am: >60 mL/min (ref >60)
GFR, Estimated: >60 mL/min (ref >60)
Glucose, Bld: 108 mg/dL — ABNORMAL HIGH (ref 70–99)
Potassium: 3.2 mmol/L — ABNORMAL LOW (ref 3.5–5.1)
Sodium: 140 mmol/L (ref 135–145)
Total Bilirubin: 1.2 mg/dL (ref 0.3–1.2)
Total Protein: 5.8 g/dL — ABNORMAL LOW (ref 6.5–8.1)

## 2019-04-27 MED ORDER — SODIUM CHLORIDE 0.9 % IV SOLN
2400.0000 mg/m2 | INTRAVENOUS | Status: DC
  Administered 2019-04-27: 18:00:00 4000 mg via INTRAVENOUS
  Filled 2019-04-27: qty 80

## 2019-04-27 MED ORDER — ATROPINE SULFATE 0.4 MG/ML IJ SOLN
INTRAMUSCULAR | Status: AC
  Filled 2019-04-27: qty 1

## 2019-04-27 MED ORDER — IRINOTECAN HCL CHEMO INJECTION 100 MG/5ML
100.0000 mg/m2 | Freq: Once | INTRAVENOUS | Status: AC
  Administered 2019-04-27: 160 mg via INTRAVENOUS
  Filled 2019-04-27: qty 8

## 2019-04-27 MED ORDER — ALTEPLASE 2 MG IJ SOLR
INTRAMUSCULAR | Status: AC
  Filled 2019-04-27: qty 2

## 2019-04-27 MED ORDER — ATROPINE SULFATE 1 MG/ML IJ SOLN: 0.5000 mg | Freq: Once | INTRAMUSCULAR | Status: DC | PRN

## 2019-04-27 MED ORDER — ATROPINE SULFATE 0.4 MG/ML IJ SOLN
0.4000 mg | Freq: Once | INTRAMUSCULAR | Status: AC | PRN
  Administered 2019-04-27: 16:00:00 0.4 mg via INTRAVENOUS

## 2019-04-27 MED ORDER — FAMOTIDINE IN NACL 20-0.9 MG/50ML-% IV SOLN
20.0000 mg | Freq: Once | INTRAVENOUS | Status: AC
  Administered 2019-04-27: 20 mg via INTRAVENOUS

## 2019-04-27 MED ORDER — POTASSIUM CHLORIDE CRYS ER 20 MEQ PO TBCR: 20.0000 meq | EXTENDED_RELEASE_TABLET | Freq: Every day | ORAL | 1 refills | Status: DC

## 2019-04-27 MED ORDER — LEUCOVORIN CALCIUM INJECTION 350 MG
400.0000 mg/m2 | Freq: Once | INTRAVENOUS | Status: AC
  Administered 2019-04-27: 664 mg via INTRAVENOUS
  Filled 2019-04-27: qty 33.2

## 2019-04-27 MED ORDER — SODIUM CHLORIDE 0.9 % IV SOLN
Freq: Once | INTRAVENOUS | Status: AC
  Administered 2019-04-27: 12:00:00 via INTRAVENOUS
  Filled 2019-04-27: qty 5

## 2019-04-27 MED ORDER — METHYLPREDNISOLONE SODIUM SUCC 125 MG IJ SOLR
60.0000 mg | Freq: Once | INTRAMUSCULAR | Status: AC
  Administered 2019-04-27: 15:00:00 60 mg via INTRAVENOUS

## 2019-04-27 MED ORDER — DEXTROSE 5 % IV SOLN
Freq: Once | INTRAVENOUS | Status: AC
  Administered 2019-04-27: 12:00:00 via INTRAVENOUS
  Filled 2019-04-27: qty 250

## 2019-04-27 MED ORDER — PALONOSETRON HCL INJECTION 0.25 MG/5ML
0.2500 mg | Freq: Once | INTRAVENOUS | Status: AC
  Administered 2019-04-27: 12:00:00 0.25 mg via INTRAVENOUS

## 2019-04-27 MED ORDER — OXALIPLATIN CHEMO INJECTION 100 MG/20ML
85.0000 mg/m2 | Freq: Once | INTRAVENOUS | Status: AC
  Administered 2019-04-27: 13:00:00 140 mg via INTRAVENOUS
  Filled 2019-04-27: qty 20

## 2019-04-27 MED ORDER — SODIUM CHLORIDE 0.9% FLUSH
10.0000 mL | INTRAVENOUS | Status: DC | PRN
  Administered 2019-04-27: 10 mL
  Filled 2019-04-27: qty 10

## 2019-04-27 MED ORDER — ALTEPLASE 2 MG IJ SOLR
2.0000 mg | Freq: Once | INTRAMUSCULAR | Status: AC | PRN
  Administered 2019-04-27: 2 mg
  Filled 2019-04-27: qty 2

## 2019-04-27 MED ORDER — PALONOSETRON HCL INJECTION 0.25 MG/5ML
INTRAVENOUS | Status: AC
  Filled 2019-04-27: qty 5

## 2019-04-27 MED ORDER — DIPHENOXYLATE-ATROPINE 2.5-0.025 MG PO TABS: 2.0000 | ORAL_TABLET | Freq: Four times a day (QID) | ORAL | 1 refills | Status: DC | PRN

## 2019-04-27 NOTE — Patient Instructions (Signed)
  [place] Discharge Instructions for Patients Receiving Chemotherapy  Today you received the following chemotherapy agents: Oxaliplatin, Leucovorin, Irinotecan, and Fluorouracil  To help prevent nausea and vomiting after your treatment, we encourage you to take your nausea medication as prescribed.  DO NOT TAKE ZOFRAN (ONDANSTERON) FOR 3 DAYS FOLLOWING TREATMENT!   If you develop nausea and vomiting that is not controlled by your nausea medication, call the clinic.   BELOW ARE SYMPTOMS THAT SHOULD BE REPORTED IMMEDIATELY:  *FEVER GREATER THAN 100.5 F  *CHILLS WITH OR WITHOUT FEVER  NAUSEA AND VOMITING THAT IS NOT CONTROLLED WITH YOUR NAUSEA MEDICATION  *UNUSUAL SHORTNESS OF BREATH  *UNUSUAL BRUISING OR BLEEDING  TENDERNESS IN MOUTH AND THROAT WITH OR WITHOUT PRESENCE OF ULCERS  *URINARY PROBLEMS  *BOWEL PROBLEMS  UNUSUAL RASH Items with * indicate a potential emergency and should be followed up as soon as possible.  Feel free to call the clinic should you have any questions or concerns. The clinic phone number is [phone].  Please show the [place] at check-in to the Emergency Department and triage nurse.

## 2019-04-27 NOTE — Progress Notes (Signed)
Dr [name] ok to tx w/ ANC 1.5 and K 3.2 today   Pt c/o new  "tingling" in bilateral fingertips. Pt reported after returning from restroom, "there is a little bit of blood on my pantiliner" Dr [name] notified. No new orders. Will continue to monitor.   Pt almost through with Oxaliplatin infusion, c/o jaw pain, sore scratchy throat. Infusion paused. NS hung to gravity and hypersensitivity protocol started.  Sandi Mealy consulted via phone. Pt given warm towel to breath through. Dr [name] to infusion room. Pt also c/o not being able to talk and numbness in mouth. Solu medrol given as charted on MAR and VS as charted in flowsheets.  Per Dr [name] ok to start leucovorin @ 1530, VS to be checked and irinotecan to be started at 1545. @1522  pts left eyelid getting droopy and shutting. Pt c/o having trouble breathing, O2 levels 99% pepcid given as charted on MAR. Sandi Mealy to infusion room. @1535  pt c/o bilateral hands being hard to close into a fist.   Consulted with pharmacy per Lenna Sciara ok to proceed with leucovorin and irinotecan.   Report given to [facility] and pt care transferred.

## 2019-04-27 NOTE — Telephone Encounter (Signed)
Received call from pt stating she has an appt here @ 9:15 am & hasn't heard from her ride.  Notified transportation dept @ [phone] & they will call pt with arrangements.

## 2019-04-27 NOTE — Progress Notes (Signed)
Julie Rollins was receiving FOLFIRINOX today.  She became hoarse had cramping in her jaw and left eye drooping after she received oxaliplatin.  The patient was seen by Dr. [name].  She was given Solu-Medrol 60 mg IV x1 and Pepcid 20 mg IV x1.  I was asked to see the patient also by her infusion nurse.  I instructed that the patient be given a warm towel to breath through.  Additionally she was given something warm to drink.  Her symptoms abated.  She was able to complete her chemotherapy.  Sandi Mealy, MHS, PA-C Physician Assistant

## 2019-04-28 ENCOUNTER — Telehealth: Payer: Self-pay | Admitting: Nurse Practitioner

## 2019-04-28 ENCOUNTER — Encounter: Payer: Self-pay | Admitting: General Practice

## 2019-04-28 ENCOUNTER — Telehealth: Payer: Self-pay | Admitting: Hematology

## 2019-04-28 LAB — CANCER ANTIGEN 19-9: CA 19-9: 728 U/mL — ABNORMAL HIGH (ref 0–35)

## 2019-04-28 NOTE — Telephone Encounter (Signed)
No los per 6/3. °

## 2019-04-28 NOTE — Progress Notes (Signed)
[place] CSW Progress Notes  Email from [employer], concerned that patient was unstable at the end of her chemotherapy yesterday.  Required [employer] instead of regular [employer] driver.  [employer] driver was concerned about some of patient's behaviors on the ride home.  Was displaying side effects from chemo and needed help getting into her house.  Spoke w MD and patient - per patients, she continues to feel "wobbly" and is using a walker today as she is afraid of falling.  She anticipates being better tomorrow - she will drive herself to/from her appt on 6/5 and on 6/10 (fluids).  Her next chemo appt is 6/17 - she will use [employer] for a ride in and will arrange for her across the hall neighbor to bring her home at the end of the day in case she experiences similar reaction as she did after yesterday's treatment.  After MD advises on suitability of this plan, CSW will inform Transportation Services of the plan. At this point, patient is planning to get herself to/from [facility] on 6.5 and 6/10, asks for help from [facility] and neighbor on 6/17.    Edwyna Shell, LCSW Clinical Social Worker Phone:  [phone]

## 2019-04-28 NOTE — Telephone Encounter (Signed)
Scheduled appt per sch msg. Called and spoke with patient. Confirmed date and time °

## 2019-04-29 ENCOUNTER — Inpatient Hospital Stay: Payer: Medicare HMO

## 2019-04-29 ENCOUNTER — Other Ambulatory Visit: Payer: Self-pay

## 2019-04-29 VITALS — BP 125/47 | HR 66 | Temp 98.3°F | Resp 18

## 2019-04-29 DIAGNOSIS — C250 Malignant neoplasm of head of pancreas: Secondary | ICD-10-CM

## 2019-04-29 DIAGNOSIS — Z5111 Encounter for antineoplastic chemotherapy: Secondary | ICD-10-CM | POA: Diagnosis not present

## 2019-04-29 MED ORDER — HEPARIN SOD (PORK) LOCK FLUSH 100 UNIT/ML IV SOLN
500.0000 [IU] | Freq: Once | INTRAVENOUS | Status: AC | PRN
  Administered 2019-04-29: 500 [IU]
  Filled 2019-04-29: qty 5

## 2019-04-29 MED ORDER — SODIUM CHLORIDE 0.9% FLUSH
10.0000 mL | INTRAVENOUS | Status: DC | PRN
  Administered 2019-04-29: 10 mL
  Filled 2019-04-29: qty 10

## 2019-04-29 NOTE — Patient Instructions (Signed)

## 2019-05-02 ENCOUNTER — Telehealth: Payer: Self-pay | Admitting: *Deleted

## 2019-05-02 ENCOUNTER — Encounter (INDEPENDENT_AMBULATORY_CARE_PROVIDER_SITE_OTHER): Payer: Medicare HMO | Admitting: Ophthalmology

## 2019-05-02 NOTE — Telephone Encounter (Signed)
Received vm message from patient regarding her appts on Wednesday.  TCT patient. She is seeking to confirm her appts and find out what they were for and how long she would be here. Informed patient that she would be here approx 4-4.5 hours and visits include lab, port flush, MD appt and then IV fluids for 2 hours Pt will arrange transportation as she does not feel up to driving. Pt continues to have some diarrhea but is doing a little bit better with Imodium.  She has not tried the Lomotil yet.  She voices understanding to use lomotil if imodium does not help.  Encouraged increased fluid intake.

## 2019-05-03 ENCOUNTER — Telehealth: Payer: Self-pay | Admitting: Licensed Clinical Social Worker

## 2019-05-03 NOTE — Progress Notes (Addendum)
[facility]   [phone] Fax:([phone]) [phone]   Clinic Follow up Note   Patient Care Team: Nolene Ebbs, MD as PCP - General (Internal Medicine) Stark Klein, MD as Consulting Physician (General Surgery) Truitt Merle, MD as Consulting Physician (Hematology) Alla Feeling, NP as Nurse Practitioner (Nurse Practitioner) Arna Snipe, RN as Oncology Nurse Navigator 05/04/2019  CHIEF COMPLAINT: f/u pancreas cancer   SUMMARY OF ONCOLOGIC HISTORY:   Malignant neoplasm of head of pancreas ([place])   03/28/2019 Imaging    CT AP w contrast IMPRESSION: Interval development of 20 x 14 mm ill-defined low density in pancreatic head concerning for possible neoplasm or malignancy. This appears to be resulting in severe intrahepatic and extrahepatic biliary dilatation. ERCP is recommended for further evaluation.  Sigmoid diverticulosis without inflammation.    03/29/2019 Initial Biopsy    Diagnosis FINE NEEDLE ASPIRATION, ENDOSCOPIC EGUS, PANCREATIC HEAD MASS (SPECIMEN 1 OF 2 COLLECTED 03/29/2019) MALIGNANT CELLS CONSISTENT WITH ADENOCARCINOMA.    03/29/2019 Initial Biopsy    Diagnosis Duodenum, Biopsy, abnormal mucosa at major papilla - ADENOCARCINOMA. SEE NOTE Diagnosis Note The duodenal mucosa is negative for dysplasia. The findings are consistent with duodenal involvement from patient's suspected primary pancreatic adenocarcinoma. Dr. Melina Copa has reviewed this case and concurs with the above interpretation. Dr. Ardis Hughs was notified on 03/30/2019.    03/29/2019 Pathology Results    Diagnosis BILE DUCT BRUSHING (SPECIMEN 2 OF 2, COLLECTED ON 03/29/2019): HIGHLY SUSPICIOUS FOR MALIGNANCY.    03/29/2019 Procedure    ERCP with stenting of malignant distal CBD stricture per Dr. Ardis Hughs    04/04/2019 Initial Diagnosis    Malignant neoplasm of head of pancreas ([place])    04/08/2019 Imaging    CT Chest IMPRESSION: No evidence of metastatic disease in the chest. Aortic  Atherosclerosis (ICD10-I70.0).     04/13/2019 -  Chemotherapy    neoadjuvant FOLFIRINOX q3weeks beginning 04/13/19; dose-reduced with cycle 1     CURRENT THERAPY: neoadjuvant FOLFIRINOX started 04/13/19, s/p cycle 2   INTERVAL HISTORY: Ms Julie Rollins returns for follow up after cycle 2 FOLFIRINOX completed on 04/27/19. During oxaliplatin she became hoarse with cramping in her jaw. She was given warm liquids and warm towel to breath through and symptoms resolved. Since chemo began she had diarrhea initially controlled with imodium, but increased since cycle 2 (irinotecan dose increased to 100 mg/m2 from 80 mg/m2). Stools are often lose and occasionally watery. She had at least 4 episodes daily for 1 week. She took 6 tabs lomotil yesterday. She has had 1 episode so far today and took lomotil. She stopped imodium when she began lomotil. There is some blood with wiping. She has nausea without vomiting, takes compazine which helps some. Denies abdominal pain. She is frustrated with GI symptoms and has lost weight. She can't eat much. Drank boost few days ago but doesn't like the taste. Tried gatorade, soup broth, bananas. Her gums feel raw. Still has cold sensitivity but denies other neuropathy. Hands shake occasionally when she feels anxious and nervous. She is irritable towards others. She feels like she is a burden on her neighbor who helps her with transportation. She is tearful and doesn't know how much more chemo she can take. Otherwise, she denies fever, chills, cough, chest pain, dyspnea, leg edema.    MEDICAL HISTORY:  Past Medical History:  Diagnosis Date   Anxiety    Blood transfusion without reported diagnosis    Breast CA ([place])    s/p lumpectomy and radiation 2000  Breast cancer ([place])    Colitis    2007   Hernia, epigastric    HTN (hypertension)    "mild hypertension", was on BP med years ago but it caused orthostatic hypotension and she has not been medicated since   Hypothyroid      Personal history of radiation therapy 2001   Vertigo    Vertigo     SURGICAL HISTORY: Past Surgical History:  Procedure Laterality Date   ABDOMINAL HYSTERECTOMY     73yrs ago   ANTERIOR CERVICAL DECOMP/DISCECTOMY FUSION N/A 08/02/2018   Procedure: ANTERIOR CERVICAL DECOMPRESSION/DISCECTOMY FUSION CERVICAL FIVE- CERVICAL SIX;  Surgeon: Earnie Larsson, MD;  Location: [facility] OR;  Service: Neurosurgery;  Laterality: N/A;  ANTERIOR CERVICAL DECOMPRESSION/DISCECTOMY FUSION CERVICAL FIVE- CERVICAL SIX   BILIARY BRUSHING  03/29/2019   Procedure: BILIARY BRUSHING;  Surgeon: [name], MD;  Location: [facility] ENDOSCOPY;  Service: Endoscopy;;   BILIARY STENT PLACEMENT  03/29/2019   Procedure: BILIARY STENT PLACEMENT;  Surgeon: [name], MD;  Location: [facility]COPY;  Service: Endoscopy;;   BIOPSY  03/29/2019   Procedure: BIOPSY;  Surgeon: [name], MD;  Location: [facility] ENDOSCOPY;  Service: Endoscopy;;   BREAST LUMPECTOMY     2000   ERCP N/A 03/29/2019   Procedure: ENDOSCOPIC RETROGRADE CHOLANGIOPANCREATOGRAPHY (ERCP);  Surgeon: [name], MD;  Location: [facility] ENDOSCOPY;  Service: Endoscopy;  Laterality: N/A;   ESOPHAGOGASTRODUODENOSCOPY (EGD) WITH PROPOFOL N/A 03/29/2019   Procedure: ESOPHAGOGASTRODUODENOSCOPY (EGD) WITH PROPOFOL;  Surgeon: [name], MD;  Location: [facility] ENDOSCOPY;  Service: Endoscopy;  Laterality: N/A;   EUS N/A 03/29/2019   Procedure: UPPER ENDOSCOPIC ULTRASOUND (EUS) LINEAR;  Surgeon: [name], MD;  Location: [facility] ENDOSCOPY;  Service: Endoscopy;  Laterality: N/A;   FINE NEEDLE ASPIRATION  03/29/2019   Procedure: FINE NEEDLE ASPIRATION (FNA);  Surgeon: [name], MD;  Location: [facility] ENDOSCOPY;  Service: Endoscopy;;   HERNIA REPAIR     20+ years ago   PORTACATH PLACEMENT N/A 04/12/2019   Procedure: INSERTION PORT-A-CATH LEFT SUBCLAVIAN;  Surgeon: Stark Klein, MD;  Location: [facility];  Service: General;  Laterality: N/A;   RETINAL  DETACHMENT SURGERY     SPHINCTEROTOMY  03/29/2019   Procedure: SPHINCTEROTOMY;  Surgeon: [name], MD;  Location: [facility] ENDOSCOPY;  Service: Endoscopy;;    I have reviewed the social history and family history with the patient and they are unchanged from previous note.  ALLERGIES:  is allergic to codeine.  MEDICATIONS:  Current Outpatient Medications  Medication Sig Dispense Refill   acetaminophen (TYLENOL) 325 MG tablet Take 2 tablets (650 mg total) by mouth every 8 (eight) hours. (Patient not taking: Reported on 04/23/2019)     acetaminophen (TYLENOL) 500 MG tablet Take 1,000 mg by mouth every 6 (six) hours as needed.     citalopram (CELEXA) 20 MG tablet Take 20 mg by mouth at bedtime.     diltiazem (CARDIZEM CD) 180 MG 24 hr capsule Take 180 mg by mouth daily.     diphenoxylate-atropine (LOMOTIL) 2.5-0.025 MG tablet Take 2 tablets by mouth 4 (four) times daily as needed for diarrhea or loose stools. 60 tablet 1   HYDROcodone-acetaminophen (NORCO/VICODIN) 5-325 MG tablet Take 0.5-1 tablets by mouth every 6 (six) hours as needed for moderate pain or severe pain. 8 tablet 0   levothyroxine (SYNTHROID, LEVOTHROID) 50 MCG tablet Take 50 mcg by mouth every morning.     lidocaine-prilocaine (EMLA) cream Apply to affected area once (Patient taking  differently: Apply 1 application topically daily as needed. port) 30 g 3   LORazepam (ATIVAN) 0.5 MG tablet Take 0.5 tablets (0.25 mg total) by mouth every 8 (eight) hours as needed for anxiety. 30 tablet 0   ondansetron (ZOFRAN) 8 MG tablet Take 1 tablet (8 mg total) by mouth 2 (two) times daily as needed for refractory nausea / vomiting. Start on day 3 after chemotherapy. 30 tablet 1   pantoprazole (PROTONIX) 20 MG tablet Take 1 tablet (20 mg total) by mouth 2 (two) times daily before a meal. 60 tablet 2   potassium chloride 20 MEQ/15ML (10%) SOLN Take 15 mLs (20 mEq total) by mouth daily. 473 mL 0   prochlorperazine (COMPAZINE) 10 MG  tablet Take 1 tablet (10 mg total) by mouth every 6 (six) hours as needed (NAUSEA). 30 tablet 1   Current Facility-Administered Medications  Medication Dose Route Frequency Provider Last Rate Last Dose   0.9 %  sodium chloride infusion   Intravenous Continuous Alla Feeling, NP 500 mL/hr at 05/04/19 1040      PHYSICAL EXAMINATION: ECOG PERFORMANCE STATUS: 2 - Symptomatic, <50% confined to bed  Vitals:   05/04/19 0919  BP: 122/82  Pulse: 99  Resp: 18  Temp: 98.9 F (37.2 C)  SpO2: 99%   Filed Weights   05/04/19 0919  Weight: 125 lb 1.6 oz (56.7 kg)    GENERAL:alert, no distress and comfortable SKIN: no obvious rash  EYES:  sclera clear OROPHARYNX: no thrush or ulcers LUNGS: respirations even and unlabored  HEART:  no lower extremity edema Musculoskeletal:no cyanosis of digits  NEURO: alert & oriented x 3 with fluent speech, tearful PAC without erythema  Limited exam for covid19 outbreak   LABORATORY DATA:  I have reviewed the data as listed CBC Latest Ref Rng & Units 05/04/2019 04/27/2019 04/23/2019  WBC 4.0 - 10.5 K/uL 4.4 3.3(L) 4.6  Hemoglobin 12.0 - 15.0 g/dL 12.4 11.2(L) 11.5(L)  Hematocrit 36.0 - 46.0 % 36.5 33.3(L) 35.2(L)  Platelets 150 - 400 K/uL 188 299 294     CMP Latest Ref Rng & Units 05/04/2019 04/27/2019 04/23/2019  Glucose 70 - 99 mg/dL 127(H) 108(H) 118(H)  BUN 8 - 23 mg/dL 6(L) 9 5(L)  Creatinine 0.44 - 1.00 mg/dL 0.71 0.65 0.63  Sodium 135 - 145 mmol/L 137 140 137  Potassium 3.5 - 5.1 mmol/L 3.8 3.2(L) 3.3(L)  Chloride 98 - 111 mmol/L 103 102 105  CO2 22 - 32 mmol/L 24 28 20(L)  Calcium 8.9 - 10.3 mg/dL 8.7(L) 8.5(L) 8.9  Total Protein 6.5 - 8.1 g/dL 6.4(L) 5.8(L) 6.7  Total Bilirubin 0.3 - 1.2 mg/dL 1.0 1.2 1.8(H)  Alkaline Phos 38 - 126 U/L 128(H) 155(H) 166(H)  AST 15 - 41 U/L 35 20 33  ALT 0 - 44 U/L 49(H) 29 44      RADIOGRAPHIC STUDIES: I have personally reviewed the radiological images as listed and agreed with the findings in the  report. No results found.   ASSESSMENT & PLAN: 73 yo female with  1. Adenocarcinoma of pancreatic head, cT2N0M0, with duodenal involvement -diagnosed 03/2019, presented with painless juandices/p ERCP with stenting (bili 23.9). Staging work up was negative for metastatic disease. She was seen by Dr. Barry Dienes who feels her tumor is resectable. She recommended neoadjuvant chemotherapy; Dr. [name] recommended dose-reduced FOLFIRINOX due to her age and limited social support. Her brother recently passed away  -she completed cycle 1 on 04/13/19; she tolerated fairly well with delayed nausea and  vomiting, diarrhea, and moderate fatigue.  -s/p increased dose cycle 2 on 04/27/19, she tolerated poorly, she had increased nausea and diarrhea and 4 lbs weight loss.  -I reviewed symptom management today. She will get 1 liter NS over 2 hours and po imodium in clinic. She is frustrated with her GI side effects and feels like she is a burden on her neighbor. She does not know how much more she can take.  -I discussed with Dr. [name] who plans to dose-reduce from next cycle. If she continues to tolerate poorly will likely switch to gemcitabine and abraxane.  -for anxiety, she is on celexa; I prescribed ativan 0.25 mg q8h PRN for anxiety and refractory nausea.  -she is followed by SW  -labs reviewed, CBC and CMP stable. CA 19-9 decreased from 977 to 728 after cycle 1; this is encouraging -she agrees to return in 1 week for f/u and cycle 3  2. 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 Ardis Hughs on 03/29/19  -her juandice resolved, bili normalized   3. Chemotherapy induced nausea and vomiting (CINV), diarrhea -she had mild nausea after chemo cycle 1, which increased and she developed vomiting on day 6; 1 episode daily for 3 days -not adequately controlled with zofran and compazine, but she had not maximized doses - Continue  nutrition support. I will ask dietician Joli to call her  -her nausea is not well managed, we reviewed anti-emetics again today. I prescribed very low dose ativan 0.25 mg q8 PRN for refractory nausea and anxiety; I reviewed side effect profile, especially drowsiness, and recommend she take it at night. She understands.  -she stopped imodium after starting lomotil, I recommend she add it back and alternate, can take each up to 8 tabs per day.   4. Lower leg edema  -she noticed swollen legs a few days ago, and notes intermittent discomfort in left calf -give her underlying pancreatic cancer and high risk for thrombosis will obtain doppler study to r/o DVT. -Doppler 5/22 negative for DVT, previously reviewed  -I encouraged her to elevate legs during rest, and wear compression stockings if needed   5. Acid-related esophagitis with bleeding -found on EGD 03/29/19 per Dr. Ardis Hughs -continue pantoprazole 20 mg BID  6. Social support -She has used our transportation service with some issues on both ends -her neighbor drove her today, pt feels like she is becoming a burden on her  -followed by [facility]   7. Genetics  -Due to her personal history of pancreas cancer, she qualifies for genetic testing. She has appt on 05/05/19  PLAN: -CBC reviewed, CMP pending -Reviewed anti-emetics and anti-diarrheal regimen; reviewed diet -Will ask dietician to see her today with IVF -Changed K tab to liquid - take 20 mEq per day -swish with warm salt water 4 times per day for sore gums  -New Rx: ativan 0.5 mg - take 1/2 tab (0.25 mg) q8h PRN for anxiety and n/v  -1 liter NS over 2 hours today, 1 tab imodium in clinic  -dose reduction from cycle 3 -genetics 05/05/19  -F/u in 1 weeks with cycle 3   All questions were answered. The patient knows to call the clinic with any problems, questions or concerns. No barriers to learning was detected.     Alla Feeling, NP 05/04/19

## 2019-05-03 NOTE — Telephone Encounter (Signed)
Called regarding upcoming webex appointment, patient does not have access for Webex and needs this to be a walk-in visit.

## 2019-05-04 ENCOUNTER — Encounter: Payer: Self-pay | Admitting: General Practice

## 2019-05-04 ENCOUNTER — Inpatient Hospital Stay: Payer: Medicare HMO

## 2019-05-04 ENCOUNTER — Telehealth: Payer: Self-pay | Admitting: Nurse Practitioner

## 2019-05-04 ENCOUNTER — Inpatient Hospital Stay (HOSPITAL_BASED_OUTPATIENT_CLINIC_OR_DEPARTMENT_OTHER): Payer: Medicare HMO | Admitting: Nurse Practitioner

## 2019-05-04 ENCOUNTER — Other Ambulatory Visit: Payer: Self-pay

## 2019-05-04 ENCOUNTER — Encounter: Payer: Self-pay | Admitting: Nurse Practitioner

## 2019-05-04 VITALS — BP 122/82 | HR 99 | Temp 98.9°F | Resp 18 | Ht 64.0 in | Wt 125.1 lb

## 2019-05-04 DIAGNOSIS — C250 Malignant neoplasm of head of pancreas: Secondary | ICD-10-CM | POA: Diagnosis not present

## 2019-05-04 DIAGNOSIS — R197 Diarrhea, unspecified: Secondary | ICD-10-CM

## 2019-05-04 DIAGNOSIS — R600 Localized edema: Secondary | ICD-10-CM

## 2019-05-04 DIAGNOSIS — Z95828 Presence of other vascular implants and grafts: Secondary | ICD-10-CM

## 2019-05-04 DIAGNOSIS — K208 Other esophagitis: Secondary | ICD-10-CM

## 2019-05-04 DIAGNOSIS — Z79899 Other long term (current) drug therapy: Secondary | ICD-10-CM

## 2019-05-04 DIAGNOSIS — Z5111 Encounter for antineoplastic chemotherapy: Secondary | ICD-10-CM | POA: Diagnosis not present

## 2019-05-04 DIAGNOSIS — F419 Anxiety disorder, unspecified: Secondary | ICD-10-CM

## 2019-05-04 DIAGNOSIS — R110 Nausea: Secondary | ICD-10-CM

## 2019-05-04 DIAGNOSIS — C784 Secondary malignant neoplasm of small intestine: Secondary | ICD-10-CM

## 2019-05-04 DIAGNOSIS — Z923 Personal history of irradiation: Secondary | ICD-10-CM

## 2019-05-04 LAB — CBC WITH DIFFERENTIAL (CANCER CENTER ONLY)
Abs Immature Granulocytes: 0.02 10*3/uL (ref 0.00–0.07)
Basophils Absolute: 0 10*3/uL (ref 0.0–0.1)
Basophils Relative: 1 %
Eosinophils Absolute: 0.4 10*3/uL (ref 0.0–0.5)
Eosinophils Relative: 8 %
HCT: 36.5 % (ref 36.0–46.0)
Hemoglobin: 12.4 g/dL (ref 12.0–15.0)
Immature Granulocytes: 1 %
Lymphocytes Relative: 20 %
Lymphs Abs: 0.9 10*3/uL (ref 0.7–4.0)
MCH: 31.3 pg (ref 26.0–34.0)
MCHC: 34 g/dL (ref 30.0–36.0)
MCV: 92.2 fL (ref 80.0–100.0)
Monocytes Absolute: 0.2 10*3/uL (ref 0.1–1.0)
Monocytes Relative: 5 %
Neutro Abs: 2.9 10*3/uL (ref 1.7–7.7)
Neutrophils Relative %: 65 %
Platelet Count: 188 10*3/uL (ref 150–400)
RBC: 3.96 MIL/uL (ref 3.87–5.11)
RDW: 15.1 % (ref 11.5–15.5)
WBC Count: 4.4 10*3/uL (ref 4.0–10.5)
nRBC: 0 % (ref 0.0–0.2)

## 2019-05-04 LAB — CMP (CANCER CENTER ONLY)
ALT: 49 U/L — ABNORMAL HIGH (ref 0–44)
AST: 35 U/L (ref 15–41)
Albumin: 3.2 g/dL — ABNORMAL LOW (ref 3.5–5.0)
Alkaline Phosphatase: 128 U/L — ABNORMAL HIGH (ref 38–126)
Anion gap: 10 (ref 5–15)
BUN: 6 mg/dL — ABNORMAL LOW (ref 8–23)
CO2: 24 mmol/L (ref 22–32)
Calcium: 8.7 mg/dL — ABNORMAL LOW (ref 8.9–10.3)
Chloride: 103 mmol/L (ref 98–111)
Creatinine: 0.71 mg/dL (ref 0.44–1.00)
GFR, Est AFR Am: >60 mL/min (ref >60)
GFR, Estimated: >60 mL/min (ref >60)
Glucose, Bld: 127 mg/dL — ABNORMAL HIGH (ref 70–99)
Potassium: 3.8 mmol/L (ref 3.5–5.1)
Sodium: 137 mmol/L (ref 135–145)
Total Bilirubin: 1 mg/dL (ref 0.3–1.2)
Total Protein: 6.4 g/dL — ABNORMAL LOW (ref 6.5–8.1)

## 2019-05-04 MED ORDER — LOPERAMIDE HCL 2 MG PO CAPS
ORAL_CAPSULE | ORAL | Status: AC
  Filled 2019-05-04: qty 1

## 2019-05-04 MED ORDER — SODIUM CHLORIDE 0.9% FLUSH
10.0000 mL | INTRAVENOUS | Status: DC | PRN
  Administered 2019-05-04: 10 mL
  Filled 2019-05-04: qty 10

## 2019-05-04 MED ORDER — ALTEPLASE 2 MG IJ SOLR
INTRAMUSCULAR | Status: AC
  Filled 2019-05-04: qty 2

## 2019-05-04 MED ORDER — ALTEPLASE 2 MG IJ SOLR
2.0000 mg | Freq: Once | INTRAMUSCULAR | Status: AC | PRN
  Administered 2019-05-04: 2 mg
  Filled 2019-05-04: qty 2

## 2019-05-04 MED ORDER — SODIUM CHLORIDE 0.9 % IV SOLN
INTRAVENOUS | Status: DC
  Administered 2019-05-04: 11:00:00 via INTRAVENOUS
  Filled 2019-05-04 (×2): qty 250

## 2019-05-04 MED ORDER — HEPARIN SOD (PORK) LOCK FLUSH 100 UNIT/ML IV SOLN
500.0000 [IU] | Freq: Once | INTRAVENOUS | Status: AC | PRN
  Administered 2019-05-04: 500 [IU]
  Filled 2019-05-04: qty 5

## 2019-05-04 MED ORDER — LOPERAMIDE HCL 2 MG PO TABS
2.0000 mg | ORAL_TABLET | Freq: Once | ORAL | Status: AC
  Administered 2019-05-04: 2 mg via ORAL
  Filled 2019-05-04: qty 1

## 2019-05-04 MED ORDER — LORAZEPAM 0.5 MG PO TABS: 0.2500 mg | ORAL_TABLET | Freq: Three times a day (TID) | ORAL | 0 refills | Status: DC | PRN

## 2019-05-04 MED ORDER — POTASSIUM CHLORIDE 20 MEQ/15ML (10%) PO SOLN: 20.0000 meq | Freq: Every day | ORAL | 0 refills | Status: DC

## 2019-05-04 NOTE — Telephone Encounter (Signed)
No los per 6/10.

## 2019-05-04 NOTE — Progress Notes (Signed)
[place] CSW Progress Notes  Request received from infusion room nurse to call patient to discuss options for home health aide or grocery/meal delivery.  Called patient, she lives alone but has several friends and a neighbor who are willing to help her out by getting groceries and picking up medications from the pharmacy.  Is struggling with determining what she can eat/keep down.  Options like Meals on Wheels are not a good option as she cannot eat many kinds of foods.  IN addition, Meals on Wheels is not available until Jan 2021.  Patient has appt w genetics counselor tomorrow - will make small bag of food from pantry to give patient items to try.  Patient aware that she will be working w dietitian to determine what foods she can eat without significant symptoms.  Patient is using [facility] [employer] for rides as she is able - has friend available to give her a ride home from next infusion treatment in case she needs more help getting into her home post treatment.    Edwyna Shell, LCSW Clinical Social Worker Phone:  [phone]

## 2019-05-04 NOTE — Patient Instructions (Signed)
.   Dehydration, Adult  Dehydration is when there is not enough fluid or water in your body. This happens when you lose more fluids than you take in. Dehydration can range from mild to very bad. It should be treated right away to keep it from getting very bad. Symptoms of mild dehydration may include:  Thirst.  Dry lips.  Slightly dry mouth.  Dry, warm skin.  Dizziness. Symptoms of moderate dehydration may include:  Very dry mouth.  Muscle cramps.  Dark pee (urine). Pee may be the color of tea.  Your body making less pee.  Your eyes making fewer tears.  Heartbeat that is uneven or faster than normal (palpitations).  Headache.  Light-headedness, especially when you stand up from sitting.  Fainting (syncope). Symptoms of very bad dehydration may include:  Changes in skin, such as: ? Cold and clammy skin. ? Blotchy (mottled) or pale skin. ? Skin that does not quickly return to normal after being lightly pinched and let go (poor skin turgor).  Changes in body fluids, such as: ? Feeling very thirsty. ? Your eyes making fewer tears. ? Not sweating when body temperature is high, such as in hot weather. ? Your body making very little pee.  Changes in vital signs, such as: ? Weak pulse. ? Pulse that is more than 100 beats a minute when you are sitting still. ? Fast breathing. ? Low blood pressure.  Other changes, such as: ? Sunken eyes. ? Cold hands and feet. ? Confusion. ? Lack of energy (lethargy). ? Trouble waking up from sleep. ? Short-term weight loss. ? Unconsciousness. Follow these instructions at home:   If told by your doctor, drink an ORS: ? Make an ORS by using instructions on the package. ? Start by drinking small amounts, about  cup (120 mL) every 5-10 minutes. ? Slowly drink more until you have had the amount that your doctor said to have.  Drink enough clear fluid to keep your pee clear or pale yellow. If you were told to drink an ORS,  finish the ORS first, then start slowly drinking clear fluids. Drink fluids such as: ? Water. Do not drink only water by itself. Doing that can make the salt (sodium) level in your body get too low (hyponatremia). ? Ice chips. ? Fruit juice that you have added water to (diluted). ? Low-calorie sports drinks.  Avoid: ? Alcohol. ? Drinks that have a lot of sugar. These include high-calorie sports drinks, fruit juice that does not have water added, and soda. ? Caffeine. ? Foods that are greasy or have a lot of fat or sugar.  Take over-the-counter and prescription medicines only as told by your doctor.  Do not take salt tablets. Doing that can make the salt level in your body get too high (hypernatremia).  Eat foods that have minerals (electrolytes). Examples include bananas, oranges, potatoes, tomatoes, and spinach.  Keep all follow-up visits as told by your doctor. This is important. Contact a doctor if:  You have belly (abdominal) pain that: ? Gets worse. ? Stays in one area (localizes).  You have a rash.  You have a stiff neck.  You get angry or annoyed more easily than normal (irritability).  You are more sleepy than normal.  You have a harder time waking up than normal.  You feel: ? Weak. ? Dizzy. ? Very thirsty.  You have peed (urinated) only a small amount of very dark pee during 6-8 hours. Get help right away if:  You have symptoms of very bad dehydration.  You cannot drink fluids without throwing up (vomiting).  Your symptoms get worse with treatment.  You have a fever.  You have a very bad headache.  You are throwing up or having watery poop (diarrhea) and it: ? Gets worse. ? Does not go away.  You have blood or something green (bile) in your throw-up.  You have blood in your poop (stool). This may cause poop to look black and tarry.  You have not peed in 6-8 hours.  You pass out (faint).  Your heart rate when you are sitting still is more than  100 beats a minute.  You have trouble breathing. This information is not intended to replace advice given to you by your health care provider. Make sure you discuss any questions you have with your health care provider. Document Released: 09/06/2009 Document Revised: 05/30/2016 Document Reviewed: 01/04/2016 Elsevier Interactive Patient Education  2019 [employer]    Nausea, Adult Nausea is feeling sick to your stomach or feeling that you are about to throw up (vomit). Feeling sick to your stomach is usually not serious, but it may be an early sign of a more serious medical problem. As you feel sicker to your stomach, you may throw up. If you throw up, or if you are not able to drink enough fluids, there is a risk that you may lose too much water in your body (get dehydrated). If you lose too much water in your body, you may:  Feel tired.  Feel thirsty.  Have a dry mouth.  Have cracked lips.  Go pee (urinate) less often. Older adults and people who have other diseases or a weak body defense system (immune system) have a higher risk of losing too much water in the body. The main goals of treating this condition are:  To relieve your nausea.  To ensure your nausea occurs less often.  To prevent throwing up and losing too much fluid. Follow these instructions at home: Watch your symptoms for any changes. Tell your doctor about them. Follow these instructions as told by your doctor. Eating and drinking      Take an ORS (oral rehydration solution). This is a drink that is sold at pharmacies and stores.  Drink clear fluids in small amounts as you are able. These include: ? Water. ? Ice chips. ? Fruit juice that has water added (diluted fruit juice). ? Low-calorie sports drinks.  Eat bland, easy-to-digest foods in small amounts as you are able, such as: ? Bananas. ? Applesauce. ? Rice. ? Low-fat (lean) meats. ? Toast. ? Crackers.  Avoid drinking fluids that have a lot  of sugar or caffeine in them. This includes energy drinks, sports drinks, and soda.  Avoid alcohol.  Avoid spicy or fatty foods. General instructions  Take over-the-counter and prescription medicines only as told by your doctor.  Rest at home while you get better.  Drink enough fluid to keep your pee (urine) pale yellow.  Take slow and deep breaths when you feel sick to your stomach.  Avoid food or things that have strong smells.  Wash your hands often with soap and water. If you cannot use soap and water, use hand sanitizer.  Make sure that all people in your home wash their hands well and often.  Keep all follow-up visits as told by your doctor. This is important. Contact a doctor if:  You feel sicker to your stomach.  You feel sick to your  stomach for more than 2 days.  You throw up.  You are not able to drink fluids without throwing up.  You have new symptoms.  You have a fever.  You have a headache.  You have muscle cramps.  You have a rash.  You have pain while peeing.  You feel light-headed or dizzy. Get help right away if:  You have pain in your chest, neck, arm, or jaw.  You feel very weak or you pass out (faint).  You have throw up that is bright red or looks like coffee grounds.  You have bloody or black poop (stools) or poop that looks like tar.  You have a very bad headache, a stiff neck, or both.  You have very bad pain, cramping, or bloating in your belly (abdomen).  You have trouble breathing or you are breathing very quickly.  Your heart is beating very quickly.  Your skin feels cold and clammy.  You feel confused.  You have signs of losing too much water in your body, such as: ? Dark pee, very little pee, or no pee. ? Cracked lips. ? Dry mouth. ? Sunken eyes. ? Sleepiness. ? Weakness. These symptoms may be an emergency. Do not wait to see if the symptoms will go away. Get medical help right away. Call your local emergency  services (911 in the [place] Do not drive yourself to the hospital. Summary  Nausea is feeling sick to your stomach or feeling that you are about to throw up (vomit).  If you throw up, or if you are not able to drink enough fluids, there is a risk that you may lose too much water in your body (get dehydrated).  Eat and drink what your doctor tells you. Take over-the-counter and prescription medicines only as told by your doctor.  Contact a doctor right away if your symptoms get worse or you have new symptoms.  Keep all follow-up visits as told by your doctor. This is important. This information is not intended to replace advice given to you by your health care provider. Make sure you discuss any questions you have with your health care provider. Document Released: 10/30/2011 Document Revised: 04/20/2018 Document Reviewed: 04/20/2018 Elsevier Interactive Patient Education  2019 [employer]    Diarrhea, Adult Diarrhea is when you pass loose and watery poop (stool) often. Diarrhea can make you feel weak and cause you to lose water in your body (get dehydrated). Losing water in your body can cause you to:  Feel tired and thirsty.  Have a dry mouth.  Go pee (urinate) less often. Diarrhea often lasts 2-3 days. However, it can last longer if it is a sign of something more serious. It is important to treat your diarrhea as told by your doctor. Follow these instructions at home: Eating and drinking     Follow these instructions as told by your doctor:  Take an ORS (oral rehydration solution). This is a drink that helps you replace fluids and minerals your body lost. It is sold at pharmacies and stores.  Drink plenty of fluids, such as: ? Water. ? Ice chips. ? Diluted fruit juice. ? Low-calorie sports drinks. ? Milk, if you want.  Avoid drinking fluids that have a lot of sugar or caffeine in them.  Eat bland, easy-to-digest foods in small amounts as you are able. These foods  include: ? Bananas. ? Applesauce. ? Rice. ? Low-fat (lean) meats. ? Toast. ? Crackers.  Avoid alcohol.  Avoid spicy or fatty  foods.  Medicines  Take over-the-counter and prescription medicines only as told by your doctor.  If you were prescribed an antibiotic medicine, take it as told by your doctor. Do not stop using the antibiotic even if you start to feel better. General instructions   Wash your hands often using soap and water. If soap and water are not available, use a hand sanitizer. Others in your home should wash their hands as well. Hands should be washed: ? After using the toilet or changing a diaper. ? Before preparing, cooking, or serving food. ? While caring for a sick person. ? While visiting someone in a hospital.  Drink enough fluid to keep your pee (urine) pale yellow.  Rest at home while you get better.  Watch your condition for any changes.  Take a warm bath to help with any burning or pain from having diarrhea.  Keep all follow-up visits as told by your doctor. This is important. Contact a doctor if:  You have a fever.  Your diarrhea gets worse.  You have new symptoms.  You cannot keep fluids down.  You feel light-headed or dizzy.  You have a headache.  You have muscle cramps. Get help right away if:  You have chest pain.  You feel very weak or you pass out (faint).  You have bloody or black poop or poop that looks like tar.  You have very bad pain, cramping, or bloating in your belly (abdomen).  You have trouble breathing or you are breathing very quickly.  Your heart is beating very quickly.  Your skin feels cold and clammy.  You feel confused.  You have signs of losing too much water in your body, such as: ? Dark pee, very little pee, or no pee. ? Cracked lips. ? Dry mouth. ? Sunken eyes. ? Sleepiness. ? Weakness. Summary  Diarrhea is when you pass loose and watery poop (stool) often.  Diarrhea can make you feel  weak and cause you to lose water in your body (get dehydrated).  Take an ORS (oral rehydration solution). This is a drink that is sold at pharmacies and stores.  Eat bland, easy-to-digest foods in small amounts as you are able.  Contact a doctor if your condition gets worse. Get help right away if you have signs that you have lost too much water in your body. This information is not intended to replace advice given to you by your health care provider. Make sure you discuss any questions you have with your health care provider. Document Released: 04/28/2008 Document Revised: 04/16/2018 Document Reviewed: 04/16/2018 Elsevier Interactive Patient Education  2019 [employer].

## 2019-05-05 ENCOUNTER — Other Ambulatory Visit: Payer: Self-pay | Admitting: *Deleted

## 2019-05-05 ENCOUNTER — Encounter: Payer: Self-pay | Admitting: General Practice

## 2019-05-05 ENCOUNTER — Telehealth: Payer: Self-pay

## 2019-05-05 ENCOUNTER — Inpatient Hospital Stay: Payer: Medicare HMO | Admitting: Licensed Clinical Social Worker

## 2019-05-05 DIAGNOSIS — D490 Neoplasm of unspecified behavior of digestive system: Secondary | ICD-10-CM

## 2019-05-05 DIAGNOSIS — Z95828 Presence of other vascular implants and grafts: Secondary | ICD-10-CM

## 2019-05-05 DIAGNOSIS — R197 Diarrhea, unspecified: Secondary | ICD-10-CM

## 2019-05-05 DIAGNOSIS — C250 Malignant neoplasm of head of pancreas: Secondary | ICD-10-CM

## 2019-05-05 NOTE — Telephone Encounter (Signed)
Nutrition Follow-up:  RD working remotely.  Patient with pancreatic cancer with duodenal involvement.  Patient receiving neoadjuvant chemotherapy.    Spoke with patient via phone at request of Cira Rue, NP.  Patient reports that she is not doing good.  "I can't eat nothing."  "I am trying to eat few bites of strawberry yogurt and it want go down."  Asked if she was nauseated and reports yes.  Has not taken any nausea medication today.  Reports she vomiting times 2 last night after receiving fluids. Reports that she had diarrhea this am and had it all over her floor and over her.  Reports that she had to get in the shower to clean up. Reports that her friend came over and mopped her floor for her.  Patient reports " I need some help." Has not taken anything for diarrhea this am.  Reports that friend fixed her eggs and toast this am but she could not eat it due to nausea.    Patient describes stool as yellow in color, foul smelling.  Has not noticed oily appearance.   Medications: zofran, compazine, lomotil, ativan  Labs: reviewed  Anthropometrics:   Weight has decreased to 125 lb 1.6 oz from 130 lb on 5/27.    NUTRITION DIAGNOSIS: Inadequate oral intake continues    INTERVENTION:  Encouraged patient to take nausea medication as prescribed.  Patient reports it does not help.   Encouraged patient to take diarrhea medication as prescribed as well. Question if patient could benefit from trial of pancreatic enzymes.  Offered food suggestions for patient to try (examples provided)  Patient overwhelmed and not ready for education on food choices.  Will need follow-up.   Active listening provided to patient.  Coordination of care provided with LCSW and NP regarding this patient.      MONITORING, EVALUATION, GOAL: Patient will consume adequate calories and protein to maintain weight during treatment.    NEXT VISIT: phone f/u June 16  Rajah Tagliaferro B. Zenia Resides, [place], [place] Registered  Dietitian [phone] (pager)

## 2019-05-05 NOTE — Progress Notes (Signed)
Spoke to patient at home number. Patient reports she is not doing well. She had explosive diarrhea this morning. Her friend came over to help her. The friend lives across town and is not available often any longer. Patient lives alone without family close by. She has spent the past two days in her recliner. She has not eaten much and does not have the energy to fix meals. She is not drinking much water for fear it will come back up. We discussed medications. She has not taken any Lomotil since this morning. She has only had that earlier episode of diarrhea today. She has vomited a few times. Discussed alternative methods of taking her zofran. She will let it disolve a little under her tongue prior to swallowing. She understands this does not taste good and said she does not have any taste buds right now anyhow. Asked her to increase fluids as much as possible. She does not have any Gatorade or power aid in the home. She will be asking her friend to prepare some easy meals for her to rewarm when she comes over again tomorrow. Patient is going to take Zofran now and try to fix some rice with chicken broth. She states she has no desire to eat or drink. She does not want to come to the office for fluids. She can hardly get out of her recliner to use the bathroom. She states she will call the ambulance if she gets too bad. Strongly encouraged patient to come to the office anytime tomorrow for fluids. She insisted she just can't. Referral entered for home health to visit the home for IVF tomorrow and Monday per Cira Rue, NP.

## 2019-05-05 NOTE — Progress Notes (Signed)
[place] CSW Progress Notes  Genetics counselor informed that patient cancelled today's appointment.   Called patient, states she is struggling w "throwing up and diarrhea" since last night and did not feel up to coming to [facility].  Discussed options for home delivery of groceries offered by [employer] within 5 mile radius of their store.  They offer discount to individuals on Food Stamps, which patient receives.  "But I need cooked food..."  CSW will continue to investigate; however, per [employer] there are no options for home delivery of meals at this time - [employer] is on an 8 month wait list.  Encouraged patient to work w dietitian to identify food she can eat.  Edwyna Shell, LCSW Clinical Social Worker Phone:  [phone]

## 2019-05-06 ENCOUNTER — Other Ambulatory Visit: Payer: Self-pay | Admitting: *Deleted

## 2019-05-06 ENCOUNTER — Inpatient Hospital Stay (HOSPITAL_BASED_OUTPATIENT_CLINIC_OR_DEPARTMENT_OTHER): Payer: Medicare HMO | Admitting: Medical

## 2019-05-06 ENCOUNTER — Encounter: Payer: Self-pay | Admitting: General Practice

## 2019-05-06 ENCOUNTER — Inpatient Hospital Stay: Payer: Medicare HMO

## 2019-05-06 ENCOUNTER — Telehealth: Payer: Self-pay | Admitting: Hematology

## 2019-05-06 ENCOUNTER — Telehealth: Payer: Self-pay | Admitting: *Deleted

## 2019-05-06 ENCOUNTER — Other Ambulatory Visit: Payer: Self-pay

## 2019-05-06 VITALS — BP 127/74 | HR 100 | Temp 98.7°F | Resp 18 | Ht 64.0 in | Wt 123.6 lb

## 2019-05-06 DIAGNOSIS — R630 Anorexia: Secondary | ICD-10-CM

## 2019-05-06 DIAGNOSIS — R197 Diarrhea, unspecified: Secondary | ICD-10-CM | POA: Diagnosis not present

## 2019-05-06 DIAGNOSIS — C250 Malignant neoplasm of head of pancreas: Secondary | ICD-10-CM

## 2019-05-06 DIAGNOSIS — R5383 Other fatigue: Secondary | ICD-10-CM | POA: Diagnosis not present

## 2019-05-06 DIAGNOSIS — Z5111 Encounter for antineoplastic chemotherapy: Secondary | ICD-10-CM | POA: Diagnosis not present

## 2019-05-06 DIAGNOSIS — Z95828 Presence of other vascular implants and grafts: Secondary | ICD-10-CM

## 2019-05-06 DIAGNOSIS — R112 Nausea with vomiting, unspecified: Secondary | ICD-10-CM

## 2019-05-06 DIAGNOSIS — Z79899 Other long term (current) drug therapy: Secondary | ICD-10-CM

## 2019-05-06 DIAGNOSIS — C784 Secondary malignant neoplasm of small intestine: Secondary | ICD-10-CM

## 2019-05-06 LAB — CBC WITH DIFFERENTIAL (CANCER CENTER ONLY)
Abs Immature Granulocytes: 0.01 10*3/uL (ref 0.00–0.07)
Basophils Absolute: 0 10*3/uL (ref 0.0–0.1)
Basophils Relative: 1 %
Eosinophils Absolute: 0.2 10*3/uL (ref 0.0–0.5)
Eosinophils Relative: 5 %
HCT: 39 % (ref 36.0–46.0)
Hemoglobin: 13.1 g/dL (ref 12.0–15.0)
Immature Granulocytes: 0 %
Lymphocytes Relative: 25 %
Lymphs Abs: 1.1 10*3/uL (ref 0.7–4.0)
MCH: 31.2 pg (ref 26.0–34.0)
MCHC: 33.6 g/dL (ref 30.0–36.0)
MCV: 92.9 fL (ref 80.0–100.0)
Monocytes Absolute: 0.3 10*3/uL (ref 0.1–1.0)
Monocytes Relative: 7 %
Neutro Abs: 2.6 10*3/uL (ref 1.7–7.7)
Neutrophils Relative %: 62 %
Platelet Count: 209 10*3/uL (ref 150–400)
RBC: 4.2 MIL/uL (ref 3.87–5.11)
RDW: 15.3 % (ref 11.5–15.5)
WBC Count: 4.3 10*3/uL (ref 4.0–10.5)
nRBC: 0 % (ref 0.0–0.2)

## 2019-05-06 LAB — CMP (CANCER CENTER ONLY)
ALT: 96 U/L — ABNORMAL HIGH (ref 0–44)
AST: 65 U/L — ABNORMAL HIGH (ref 15–41)
Albumin: 3.2 g/dL — ABNORMAL LOW (ref 3.5–5.0)
Alkaline Phosphatase: 140 U/L — ABNORMAL HIGH (ref 38–126)
Anion gap: 13 (ref 5–15)
BUN: 8 mg/dL (ref 8–23)
CO2: 22 mmol/L (ref 22–32)
Calcium: 9.2 mg/dL (ref 8.9–10.3)
Chloride: 101 mmol/L (ref 98–111)
Creatinine: 0.72 mg/dL (ref 0.44–1.00)
GFR, Est AFR Am: >60 mL/min (ref >60)
GFR, Estimated: >60 mL/min (ref >60)
Glucose, Bld: 107 mg/dL — ABNORMAL HIGH (ref 70–99)
Potassium: 4 mmol/L (ref 3.5–5.1)
Sodium: 136 mmol/L (ref 135–145)
Total Bilirubin: 0.9 mg/dL (ref 0.3–1.2)
Total Protein: 6.5 g/dL (ref 6.5–8.1)

## 2019-05-06 LAB — MAGNESIUM: Magnesium: 1.8 mg/dL (ref 1.7–2.4)

## 2019-05-06 MED ORDER — SODIUM CHLORIDE 0.9 % IV SOLN
INTRAVENOUS | Status: DC
  Administered 2019-05-06: 13:00:00 via INTRAVENOUS
  Filled 2019-05-06 (×2): qty 250

## 2019-05-06 MED ORDER — ONDANSETRON HCL 4 MG/2ML IJ SOLN
INTRAMUSCULAR | Status: AC
  Filled 2019-05-06: qty 4

## 2019-05-06 MED ORDER — DIPHENOXYLATE-ATROPINE 2.5-0.025 MG PO TABS
ORAL_TABLET | ORAL | Status: AC
  Filled 2019-05-06: qty 1

## 2019-05-06 MED ORDER — LORAZEPAM 1 MG PO TABS
0.5000 mg | ORAL_TABLET | Freq: Once | ORAL | Status: AC
  Administered 2019-05-06: 13:00:00 0.5 mg via SUBLINGUAL

## 2019-05-06 MED ORDER — DIPHENOXYLATE-ATROPINE 2.5-0.025 MG PO TABS
1.0000 | ORAL_TABLET | Freq: Once | ORAL | Status: AC
  Administered 2019-05-06: 13:00:00 1 via ORAL

## 2019-05-06 MED ORDER — ONDANSETRON HCL 4 MG/2ML IJ SOLN
8.0000 mg | Freq: Once | INTRAMUSCULAR | Status: AC
  Administered 2019-05-06: 8 mg via INTRAVENOUS

## 2019-05-06 MED ORDER — HEPARIN SOD (PORK) LOCK FLUSH 100 UNIT/ML IV SOLN
500.0000 [IU] | Freq: Once | INTRAVENOUS | Status: AC | PRN
  Administered 2019-05-06: 500 [IU]
  Filled 2019-05-06: qty 5

## 2019-05-06 MED ORDER — LORAZEPAM 1 MG PO TABS
ORAL_TABLET | ORAL | Status: AC
  Filled 2019-05-06: qty 1

## 2019-05-06 MED ORDER — SODIUM CHLORIDE 0.9% FLUSH
10.0000 mL | INTRAVENOUS | Status: DC | PRN
  Administered 2019-05-06: 16:00:00 10 mL
  Filled 2019-05-06: qty 10

## 2019-05-06 MED ORDER — SODIUM CHLORIDE 0.9 % IV SOLN: 8.0000 mg | Freq: Once | INTRAVENOUS | Status: DC

## 2019-05-06 NOTE — Telephone Encounter (Signed)
Called & spoke with RN at [place] informed that pt is not competent enough to teach how to flush & d/c port after fluids & will therefore cancel order for visits & will have to bring pt into office for fluids. RN expressed understanding.  Will send order for IVF on Monday at office.

## 2019-05-06 NOTE — Progress Notes (Addendum)
[facility] Progress Note   EPSIE WALTHALL [id number] [birth year] 74 y.o.  [name] is managed by Dr. Truitt Merle  Actively treated with chemotherapy/immunotherapy/hormonal therapy: yes  Current therapy: FOLFIRINOX  Last treated: 04/27/2019 (cycle 2, day 1)  Next scheduled appointment with provider: To be arranged  Assessment: Plan:    Port-A-Cath in place - Plan: heparin lock flush 100 unit/mL, sodium chloride flush (NS) 0.9 % injection 10 mL, ondansetron (ZOFRAN) injection 8 mg  Diarrhea, unspecified type - Plan: diphenoxylate-atropine (LOMOTIL) 2.5-0.025 MG per tablet 1 tablet, ondansetron (ZOFRAN) injection 8 mg, C difficile quick screen w PCR reflex  Non-intractable vomiting with nausea, unspecified vomiting type - Plan: 0.9 %  sodium chloride infusion, LORazepam (ATIVAN) tablet 0.5 mg, ondansetron (ZOFRAN) injection 8 mg, DISCONTINUED: ondansetron (ZOFRAN) 8 mg in sodium chloride 0.9 % 50 mL IVPB  Malignant neoplasm of head of pancreas (HCC)   Diarrhea: The patient was given Lomotil 1 tablet p.o. x1 today.  Additionally an order was placed for a C. difficile screen should she continue to have diarrhea.  She was given collection supplies.  She has Imodium and Lomotil at home.  Nausea and vomiting: Patient was given 1 L normal saline IV today as well as Zofran 8 mg IV x1 and Ativan 0.5 mg sublingual x1.  She has Zofran, Compazine, and Ativan at home.  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 Finfrock is status post cycle 2 of FOLFIRINOX which was dosed on 04/27/2019.  The patient met with Dr. [name] today.  Dr. [name] is considering another dose reducing FOLFIRINOX or changing to a another regimen.  Please see After Visit Summary for patient specific instructions.  Future Appointments  Date Time Provider [place]  05/09/2019  9:00 AM [facility] INFUSION [facility] None  05/10/2019 10:30 AM Jennet Maduro, RD [facility] None    05/11/2019 11:00 AM [facility] LAB 4 [facility] None  05/11/2019 11:15 AM [facility] [place] FLUSH [facility] None  05/11/2019 11:45 AM Alla Feeling, NP [facility] None  05/11/2019 12:30 PM [facility] INFUSION [facility] None  05/13/2019 10:30 AM [facility] [place] FLUSH [facility] None  05/25/2019 10:00 AM [facility] LAB ONLY [facility] None  05/25/2019 10:15 AM [facility] [place] FLUSH [facility] None  05/25/2019 10:45 AM Truitt Merle, MD [facility] None  05/25/2019 11:30 AM [facility] INFUSION [facility] None    Orders Placed This Encounter  Procedures   C difficile quick screen w PCR reflex       Subjective:   Patient ID:  ANONA GIOVANNINI is a 73 y.o. (DOB Jun 09, 1946) female.  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 Truitt Merle and is status post cycle 2 of FOLFIRINOX which was last dosed on 04/27/2019.  She presents to the clinic today with fatigue, diarrhea, nausea, vomiting, and anorexia.  She lives alone.  Plans were initially for her to get IV fluids at home.  She reports that she felt worse with the second cycle of chemotherapy than she did with the first.  Dr. [name] stated that she had increased the dose of her chemotherapy slightly with her second cycle.  The patient has been taking Lomotil and Imodium for her diarrhea without benefit.  She also has Zofran and Compazine at home in addition to Ativan.  She is only been  using the Zofran and Compazine as she was nervous regarding how the Ativan would make her feel.  She denies fevers, chills, or sweats.  She has no respiratory symptoms.  Medications: I have reviewed the patient's current medications.  Allergies:  Allergies  Allergen Reactions   Codeine Nausea Only    Past Medical History:  Diagnosis Date   Anxiety    Blood transfusion without reported  diagnosis    Breast CA ([place])    s/p lumpectomy and radiation 2000   Breast cancer ([place])    Colitis    2007   Hernia, epigastric    HTN (hypertension)    "mild hypertension", was on BP med years ago but it caused orthostatic hypotension and she has not been medicated since   Hypothyroid    Personal history of radiation therapy 2001   Vertigo    Vertigo     Past Surgical History:  Procedure Laterality Date   ABDOMINAL HYSTERECTOMY     1yr ago   ANTERIOR CERVICAL DECOMP/DISCECTOMY FUSION N/A 08/02/2018   Procedure: ANTERIOR CERVICAL DECOMPRESSION/DISCECTOMY FUSION CERVICAL FIVE- CERVICAL SIX;  Surgeon: PEarnie Larsson MD;  [facility];  Service: Neurosurgery;  Laterality: N/A;  ANTERIOR CERVICAL DECOMPRESSION/DISCECTOMY FUSION CERVICAL FIVE- CERVICAL SIX   BILIARY BRUSHING  03/29/2019   Procedure: BILIARY BRUSHING;  Surgeon: JMilus Banister MD;  [facility];  Service: Endoscopy;;   BILIARY STENT PLACEMENT  03/29/2019   Procedure: BILIARY STENT PLACEMENT;  Surgeon: JMilus Banister MD;  [facility];  Service: Endoscopy;;   BIOPSY  03/29/2019   Procedure: BIOPSY;  Surgeon: JMilus Banister MD;  [facility];  Service: Endoscopy;;   BREAST LUMPECTOMY     2000   ERCP N/A 03/29/2019   Procedure: ENDOSCOPIC RETROGRADE CHOLANGIOPANCREATOGRAPHY (ERCP);  Surgeon: JMilus Banister MD;  [facility];  Service: Endoscopy;  Laterality: N/A;   ESOPHAGOGASTRODUODENOSCOPY (EGD) WITH PROPOFOL N/A 03/29/2019   Procedure: ESOPHAGOGASTRODUODENOSCOPY (EGD) WITH PROPOFOL;  Surgeon: JMilus Banister MD;  [facility];  Service: Endoscopy;  Laterality: N/A;   EUS N/A 03/29/2019   Procedure: UPPER ENDOSCOPIC ULTRASOUND (EUS) LINEAR;  Surgeon: JMilus Banister MD;  [facility];  Service: Endoscopy;  Laterality: N/A;   FINE NEEDLE ASPIRATION  03/29/2019   Procedure: FINE NEEDLE ASPIRATION (FNA);  Surgeon: JMilus Banister MD;   [facility];  Service: Endoscopy;;   HERNIA REPAIR     20+ years ago   PORTACATH PLACEMENT N/A 04/12/2019   Procedure: INSERTION PORT-A-CATH LEFT SUBCLAVIAN;  Surgeon: BStark Klein MD;  [place]  Service: General;  Laterality: N/A;   RETINAL DETACHMENT SURGERY     SPHINCTEROTOMY  03/29/2019   Procedure: SPHINCTEROTOMY;  Surgeon: JMilus Banister MD;  [facility];  Service: Endoscopy;;    Family History  Problem Relation Age of Onset   Bladder Cancer Brother    Other Other        Denies family h/o cardiac disease    Social History   Socioeconomic History   Marital status: Divorced    Spouse name: Not on file   Number of children: Not on file   Years of education: Not on file   Highest education level: Not on file  Occupational History   Occupation: retired    Comment: CL[employer]resource strain: Not on file   Food insecurity    Worry: Not on file    Inability:  Not on file   Transportation needs    Medical: Not on file    Non-medical: Not on file  Tobacco Use   Smoking status: Never Smoker   Smokeless tobacco: Never Used  Substance and Sexual Activity   Alcohol use: No   Drug use: No   Sexual activity: Not Currently  Lifestyle   Physical activity    Days per week: Not on file    Minutes per session: Not on file   Stress: Not on file  Relationships   Social connections    Talks on phone: Not on file    Gets together: Not on file    Attends religious service: Not on file    Active member of club or organization: Not on file    Attends meetings of clubs or organizations: Not on file    Relationship status: Not on file   Intimate partner violence    Fear of current or ex partner: Not on file    Emotionally abused: Not on file    Physically abused: Not on file    Forced sexual activity: Not on file  Other Topics Concern   Not on file  Social History Narrative     Lives alone    Past Medical History, Surgical history, Social history, and Family history were reviewed and updated as appropriate.   Please see review of systems for further details on the patient's review from today.   Review of Systems:  Review of Systems  Constitutional: Positive for appetite change. Negative for chills, diaphoresis and fever.  HENT: Negative for trouble swallowing.   Respiratory: Negative for cough, choking, chest tightness, shortness of breath and wheezing.   Cardiovascular: Negative for chest pain, palpitations and leg swelling.  Gastrointestinal: Positive for diarrhea, nausea and vomiting. Negative for abdominal distention, abdominal pain, blood in stool and constipation.  Genitourinary: Negative for decreased urine volume and difficulty urinating.  Neurological: Negative for weakness and headaches.    Objective:   Physical Exam:  BP 127/74 (BP Location: Left Arm, Patient Position: Sitting)    Pulse 100    Temp 98.7 F (37.1 C) (Oral)    Resp 18    Ht '5\' 4"'$  (1.626 m)    Wt 123 lb 9.6 oz (56.1 kg)    SpO2 98%    BMI 21.22 kg/m  ECOG: 1  Physical Exam Constitutional:      General: She is not in acute distress.    Appearance: She is not diaphoretic.  HENT:     Head: Normocephalic and atraumatic.  Eyes:     General: No scleral icterus.       Right eye: No discharge.        Left eye: No discharge.     Conjunctiva/sclera: Conjunctivae normal.  Cardiovascular:     Rate and Rhythm: Normal rate and regular rhythm.     Heart sounds: Normal heart sounds. No murmur. No friction rub. No gallop.      Comments: There is an accessed port in the left chest wall. Pulmonary:     Effort: Pulmonary effort is normal. No respiratory distress.     Breath sounds: Normal breath sounds. No stridor. No wheezing or rales.  Abdominal:     General: Bowel sounds are normal. There is no distension.     Palpations: Abdomen is soft. There is no mass.     Tenderness: There is  no abdominal tenderness. There is no guarding or rebound.  Skin:    General:  Skin is warm and dry.  Neurological:     Mental Status: She is alert.     Coordination: Coordination normal.     Gait: Gait abnormal (The patient is ambulating with the use of a wheelchair.).     Lab Review:     Component Value Date/Time   NA 136 05/06/2019 1213   K 4.0 05/06/2019 1213   CL 101 05/06/2019 1213   CO2 22 05/06/2019 1213   GLUCOSE 107 (H) 05/06/2019 1213   BUN 8 05/06/2019 1213   CREATININE 0.72 05/06/2019 1213   CALCIUM 9.2 05/06/2019 1213   PROT 6.5 05/06/2019 1213   ALBUMIN 3.2 (L) 05/06/2019 1213   AST 65 (H) 05/06/2019 1213   ALT 96 (H) 05/06/2019 1213   ALKPHOS 140 (H) 05/06/2019 1213   BILITOT 0.9 05/06/2019 1213   GFRNONAA >60 05/06/2019 1213   GFRAA >60 05/06/2019 1213       Component Value Date/Time   WBC 4.3 05/06/2019 1213   WBC 4.6 04/23/2019 0332   RBC 4.20 05/06/2019 1213   HGB 13.1 05/06/2019 1213   HCT 39.0 05/06/2019 1213   PLT 209 05/06/2019 1213   MCV 92.9 05/06/2019 1213   MCH 31.2 05/06/2019 1213   MCHC 33.6 05/06/2019 1213   RDW 15.3 05/06/2019 1213   LYMPHSABS 1.1 05/06/2019 1213   MONOABS 0.3 05/06/2019 1213   EOSABS 0.2 05/06/2019 1213   BASOSABS 0.0 05/06/2019 1213   -------------------------------  Imaging from last 24 hours (if applicable):  Radiology interpretation: Ct Chest Wo Contrast  Result Date: 04/08/2019 CLINICAL DATA:  Newly diagnosed pancreatic cancer EXAM: CT CHEST WITHOUT CONTRAST TECHNIQUE: Multidetector CT imaging of the chest was performed following the standard protocol without IV contrast. COMPARISON:  07/31/2018. Partial comparison to recent CT abdomen/pelvis dated 03/28/2019. FINDINGS: Cardiovascular: The heart is normal in size. No pericardial effusion. No evidence thoracic aortic aneurysm. Very mild atherosclerotic calcifications of the aortic root. Mediastinum/Nodes: No suspicious mediastinal lymphadenopathy. Visualized  thyroid is unremarkable. Lungs/Pleura: Mild biapical pleural-parenchymal scarring. Mild scarring/atelectasis in the bilateral lower lobes. No suspicious pulmonary nodules. No focal consolidation. No pleural effusion or pneumothorax. Upper Abdomen: Interval placement of a common duct stent with associated pneumobilia. Musculoskeletal: Healed sternal fracture. Cervical spine fixation hardware, incompletely visualized. Mild superior endplate compression fracture deformity at T12 with mild retropulsion, unchanged. IMPRESSION: No evidence of metastatic disease in the chest. Aortic Atherosclerosis (ICD10-I70.0). Electronically Signed   By: Julian Hy M.D.   On: 04/08/2019 10:45   Dg Chest Port 1 View  Result Date: 04/12/2019 CLINICAL DATA:  Port-A-Cath insertion EXAM: PORTABLE CHEST 1 VIEW COMPARISON:  Chest x-ray dated 07/31/2018 FINDINGS: There is a left subclavian Port-A-Cath in place with tip projecting over the mid SVC. No evidence of a left-sided pneumothorax. The lungs are clear. The cardiac silhouette is stable. There is pleuroparenchymal scarring at the lung apices. IMPRESSION: Left subclavian Port-A-Cath as above with no evidence of a pneumothorax. Electronically Signed   By: Constance Holster M.D.   On: 04/12/2019 16:39   Dg Fluoro Guide Cv Line-no Report  Result Date: 04/12/2019 Fluoroscopy was utilized by the requesting physician.  No radiographic interpretation.   Vas [place] Lower Extremity Venous (dvt)  Result Date: 04/15/2019  Lower Venous Study Indications: Swelling.  Performing Technologist: Maudry Mayhew MHA, RDMS, RVT, RDCS  Examination Guidelines: A complete evaluation includes B-mode imaging, spectral Doppler, color Doppler, and power Doppler as needed of all accessible portions of each vessel. Bilateral testing is considered an integral part  of a complete examination. Limited examinations for reoccurring indications may be performed as noted.   +---------+---------------+---------+-----------+----------+-------+  RIGHT     Compressibility Phasicity Spontaneity Properties Summary  +---------+---------------+---------+-----------+----------+-------+  CFV       Full            Yes       Yes                             +---------+---------------+---------+-----------+----------+-------+  SFJ       Full                                                      +---------+---------------+---------+-----------+----------+-------+  FV Prox   Full                                                      +---------+---------------+---------+-----------+----------+-------+  FV Mid    Full                                                      +---------+---------------+---------+-----------+----------+-------+  FV Distal Full                                                      +---------+---------------+---------+-----------+----------+-------+  PFV       Full                                                      +---------+---------------+---------+-----------+----------+-------+  POP       Full            Yes       Yes                             +---------+---------------+---------+-----------+----------+-------+  PTV       Full                                                      +---------+---------------+---------+-----------+----------+-------+  PERO      Full                                                      +---------+---------------+---------+-----------+----------+-------+   +---------+---------------+---------+-----------+----------+-------+  LEFT      Compressibility Phasicity Spontaneity Properties Summary  +---------+---------------+---------+-----------+----------+-------+  CFV       Full  Yes       Yes                             +---------+---------------+---------+-----------+----------+-------+  SFJ       Full                                                      +---------+---------------+---------+-----------+----------+-------+  FV Prox   Full                                                       +---------+---------------+---------+-----------+----------+-------+  FV Mid    Full                                                      +---------+---------------+---------+-----------+----------+-------+  FV Distal Full                                                      +---------+---------------+---------+-----------+----------+-------+  PFV       Full                                                      +---------+---------------+---------+-----------+----------+-------+  POP       Full            Yes       Yes                             +---------+---------------+---------+-----------+----------+-------+  PTV       Full                                                      +---------+---------------+---------+-----------+----------+-------+  PERO      Full                                                      +---------+---------------+---------+-----------+----------+-------+     Summary: Right: There is no evidence of deep vein thrombosis in the lower extremity. No cystic structure found in the popliteal fossa. Left: There is no evidence of deep vein thrombosis in the lower extremity. No cystic structure found in the popliteal fossa.  *See table(s) above for measurements and observations. Electronically signed by Deitra Mayo MD on 04/15/2019 at 3:44:23 PM.    Final  This patient was seen with Dr. [name] with my treatment plan reviewed with her. She expressed agreement with my medical management of this patient.  Addendum  I have seen the patient, examined her. I agree with the assessment and and plan and have edited the notes.   Ms Dierolf tolerated last cycle chemotherapy poorly, with nausea, vomiting, diarrhea, and poor appetite.  She is here for IV fluids.  We called her home care service try to set up IV fluids at home, however is not comfortable to give IVF at home by herself, will let her come back next Monday for more  IVF.  Due to her poor tolerance to chemotherapy, and a very limited social support, especially regarding the concern of her transportation, some confusion from premedication of FOLFIRINOX, I recommend changing her chemo treatment to weekly gemcitabine and Abraxane, 2 weeks on, one-week off.  This regimen does not require significant premeds, and is less intensive, more tolerable in general.  It is also short infusion.  We discussed the potential side effect, especially hair loss, neuropathy, and cytopenias etc, she agrees to proceed. Will cancel her chemo next week to allow her recover better and start gemcitabine and abraxane the week of 6/22. NP Lacie will see her next week for f/u.   Truitt Merle  05/06/2019

## 2019-05-06 NOTE — Progress Notes (Signed)
Pt is being sent home with stool collection kit for frequent diarrhea.  PA Lucianne Lei provided materials and patient education for collection of sample for C-diff testing.  Pt verbalized understanding of instructions for collection and of d/c instructions for following up.  Pt is not being placed on isolation precautions at this time per PA VAn d/t no foul odor, abd pain, severe episodes of diarrhea, or other signs besides loose stools of C-diff/norovirus.  Pt's home health IVF appt cancelled today/this weekend d/t concerns that pt is not teachable and would not be able to follow instructions for line care/deaccessing. Pt also lives alone with no social support besides an older neighbor who brings her to appts and services provided by social work at [employer].  Scheduling message sent for pt to come back 05/09/2019 for more IVF and to discuss further care.  Pt verbalized understanding that scheduling will call her to create this appt.  MD [name] made aware.  Port deaccessed and pt transported with belongings to exit, neighbor waiting to take her home.

## 2019-05-06 NOTE — Telephone Encounter (Signed)
Confirmed Julie Rollins with patient for today.

## 2019-05-06 NOTE — Telephone Encounter (Signed)
Not able to reach patient or leave message on home phone re 6/15 appointment. Left message on cell.

## 2019-05-06 NOTE — Progress Notes (Signed)
[facility] CSW Progress Notes  Noted patient was in [facility], food bag from [facility] pantry provided including small, easy to eat items.  Edwyna Shell, LCSW Clinical Social Worker Phone:  [phone]

## 2019-05-06 NOTE — Patient Instructions (Signed)

## 2019-05-06 NOTE — Telephone Encounter (Signed)
Received call from pt. She states  'I am not doing well at all'. She states that she is having ongoing issues with nausea, vomiting and diarrhea despite zofran, imodium and lomotil.  She has no appetite and cannot eat or drink much fluid. She is feeling weak and states she doesn't know what to do. Spoke with Dr. [name]. She would like pt to be seen in [facility] today with labs-CBC, CMP and Mg. High priority message sent along with lab orders.  Pt states she can get a ride from a neighbor across the street.

## 2019-05-09 ENCOUNTER — Ambulatory Visit: Payer: Medicare HMO

## 2019-05-09 ENCOUNTER — Telehealth: Payer: Self-pay | Admitting: Hematology

## 2019-05-09 ENCOUNTER — Other Ambulatory Visit: Payer: Self-pay

## 2019-05-09 ENCOUNTER — Inpatient Hospital Stay: Payer: Medicare HMO

## 2019-05-09 VITALS — BP 112/80 | HR 85 | Temp 98.3°F | Resp 18

## 2019-05-09 DIAGNOSIS — R197 Diarrhea, unspecified: Secondary | ICD-10-CM

## 2019-05-09 DIAGNOSIS — Z5111 Encounter for antineoplastic chemotherapy: Secondary | ICD-10-CM | POA: Diagnosis not present

## 2019-05-09 DIAGNOSIS — Z95828 Presence of other vascular implants and grafts: Secondary | ICD-10-CM

## 2019-05-09 LAB — C DIFFICILE QUICK SCREEN W PCR REFLEX
C Diff antigen: NEGATIVE
C Diff interpretation: NOT DETECTED
C Diff toxin: NEGATIVE

## 2019-05-09 MED ORDER — HEPARIN SOD (PORK) LOCK FLUSH 100 UNIT/ML IV SOLN
500.0000 [IU] | Freq: Once | INTRAVENOUS | Status: AC | PRN
  Administered 2019-05-09: 12:00:00 500 [IU]
  Filled 2019-05-09: qty 5

## 2019-05-09 MED ORDER — SODIUM CHLORIDE 0.9 % IV SOLN
Freq: Once | INTRAVENOUS | Status: AC
  Administered 2019-05-09: 10:00:00 via INTRAVENOUS
  Filled 2019-05-09: qty 250

## 2019-05-09 MED ORDER — SODIUM CHLORIDE 0.9% FLUSH
10.0000 mL | Freq: Once | INTRAVENOUS | Status: AC | PRN
  Administered 2019-05-09: 10 mL
  Filled 2019-05-09: qty 10

## 2019-05-09 NOTE — Patient Instructions (Signed)
Coronavirus (COVID-19) Are you at risk?  Are you at risk for the Coronavirus (COVID-19)?  To be considered HIGH RISK for Coronavirus (COVID-19), you have to meet the following criteria:  . Traveled to [place], [place], [place], [place] or [place]; or in the [place] to [place], [place], [place], or [place]; and have fever, cough, and shortness of breath within the last 2 weeks of travel OR . Been in close contact with a person diagnosed with COVID-19 within the last 2 weeks and have fever, cough, and shortness of breath . IF YOU DO NOT MEET THESE CRITERIA, YOU ARE CONSIDERED LOW RISK FOR COVID-19.  What to do if you are HIGH RISK for COVID-19?  . If you are having a medical emergency, call 911. . Seek medical care right away. Before you go to a doctor's office, urgent care or emergency department, call ahead and tell them about your recent travel, contact with someone diagnosed with COVID-19, and your symptoms. You should receive instructions from your physician's office regarding next steps of care.  . When you arrive at healthcare provider, tell the healthcare staff immediately you have returned from visiting [place], [place], [place], [place] or [place]; or traveled in the [place] to [place], [place], [place], or [place]; in the last two weeks or you have been in close contact with a person diagnosed with COVID-19 in the last 2 weeks.   . Tell the health care staff about your symptoms: fever, cough and shortness of breath. . After you have been seen by a medical provider, you will be either: o Tested for (COVID-19) and discharged home on quarantine except to seek medical care if symptoms worsen, and asked to  - Stay home and avoid contact with others until you get your results (4-5 days)  - Avoid travel on public transportation if possible (such as bus, train, or airplane) or o Sent to the Emergency Department by EMS for evaluation, COVID-19 testing, and possible  admission depending on your condition and test results.  What to do if you are LOW RISK for COVID-19?  Reduce your risk of any infection by using the same precautions used for avoiding the common cold or flu:  . Wash your hands often with soap and warm water for at least 20 seconds.  If soap and water are not readily available, use an alcohol-based hand sanitizer with at least 60% alcohol.  . If coughing or sneezing, cover your mouth and nose by coughing or sneezing into the elbow areas of your shirt or coat, into a tissue or into your sleeve (not your hands). . Avoid shaking hands with others and consider head nods or verbal greetings only. . Avoid touching your eyes, nose, or mouth with unwashed hands.  . Avoid close contact with people who are sick. . Avoid places or events with large numbers of people in one location, like concerts or sporting events. . Carefully consider travel plans you have or are making. . If you are planning any travel outside or inside the [place], visit the CDC's Travelers' Health webpage for the latest health notices. . If you have some symptoms but not all symptoms, continue to monitor at home and seek medical attention if your symptoms worsen. . If you are having a medical emergency, call 911.   ADDITIONAL HEALTHCARE OPTIONS FOR PATIENTS  [facility] / e-Visit: [email or web address]         [facility]: [phone]  [facility]: [phone]                   [facility]: [phone]    Dehydration, Adult  Dehydration is a condition in which there is not enough fluid or water in the body. This happens when you lose more fluids than you take in. Important organs, such as the kidneys, brain, and heart, cannot function without a proper amount of fluids. Any loss of fluids from the body can lead to dehydration. Dehydration can range from mild to severe. This condition  should be treated right away to prevent it from becoming severe. What are the causes? This condition may be caused by:  Vomiting.  Diarrhea.  Excessive sweating, such as from heat exposure or exercise.  Not drinking enough fluid, especially: ? When ill. ? While doing activity that requires a lot of energy.  Excessive urination.  Fever.  Infection.  Certain medicines, such as medicines that cause the body to lose excess fluid (diuretics).  Inability to access safe drinking water.  Reduced physical ability to get adequate water and food. What increases the risk? This condition is more likely to develop in people:  Who have a poorly controlled long-term (chronic) illness, such as diabetes, heart disease, or kidney disease.  Who are age 65 or older.  Who are disabled.  Who live in a place with high altitude.  Who play endurance sports. What are the signs or symptoms? Symptoms of mild dehydration may include:  Thirst.  Dry lips.  Slightly dry mouth.  Dry, warm skin.  Dizziness. Symptoms of moderate dehydration may include:  Very dry mouth.  Muscle cramps.  Dark urine. Urine may be the color of tea.  Decreased urine production.  Decreased tear production.  Heartbeat that is irregular or faster than normal (palpitations).  Headache.  Light-headedness, especially when you stand up from a sitting position.  Fainting (syncope). Symptoms of severe dehydration may include:  Changes in skin, such as: ? Cold and clammy skin. ? Blotchy (mottled) or pale skin. ? Skin that does not quickly return to normal after being lightly pinched and released (poor skin turgor).  Changes in body fluids, such as: ? Extreme thirst. ? No tear production. ? Inability to sweat when body temperature is high, such as in hot weather. ? Very little urine production.  Changes in vital signs, such as: ? Weak pulse. ? Pulse that is more than 100 beats a minute when sitting  still. ? Rapid breathing. ? Low blood pressure.  Other changes, such as: ? Sunken eyes. ? Cold hands and feet. ? Confusion. ? Lack of energy (lethargy). ? Difficulty waking up from sleep. ? Short-term weight loss. ? Unconsciousness. How is this diagnosed? This condition is diagnosed based on your symptoms and a physical exam. Blood and urine tests may be done to help confirm the diagnosis. How is this treated? Treatment for this condition depends on the severity. Mild or moderate dehydration can often be treated at home. Treatment should be started right away. Do not wait until dehydration becomes severe. Severe dehydration is an emergency and it needs to be treated in a hospital. Treatment for mild dehydration may include:  Drinking more fluids.  Replacing salts and minerals in your blood (electrolytes) that you may have lost. Treatment for moderate dehydration may include:  Drinking an oral rehydration solution (ORS). This is a drink that helps you replace fluids and electrolytes (rehydrate). It can be found at pharmacies and retail   stores. Treatment for severe dehydration may include:  Receiving fluids through an IV tube.  Receiving an electrolyte solution through a feeding tube that is passed through your nose and into your stomach (nasogastric tube, or NG tube).  Correcting any abnormalities in electrolytes.  Treating the underlying cause of dehydration. Follow these instructions at home:  If directed by your health care provider, drink an ORS: ? Make an ORS by following instructions on the package. ? Start by drinking small amounts, about  cup (120 mL) every 5-10 minutes. ? Slowly increase how much you drink until you have taken the amount recommended by your health care provider.  Drink enough clear fluid to keep your urine clear or pale yellow. If you were told to drink an ORS, finish the ORS first, then start slowly drinking other clear fluids. Drink fluids such as:  ? Water. Do not drink only water. Doing that can lead to having too little salt (sodium) in the body (hyponatremia). ? Ice chips. ? Fruit juice that you have added water to (diluted fruit juice). ? Low-calorie sports drinks.  Avoid: ? Alcohol. ? Drinks that contain a lot of sugar. These include high-calorie sports drinks, fruit juice that is not diluted, and soda. ? Caffeine. ? Foods that are greasy or contain a lot of fat or sugar.  Take over-the-counter and prescription medicines only as told by your health care provider.  Do not take sodium tablets. This can lead to having too much sodium in the body (hypernatremia).  Eat foods that contain a healthy balance of electrolytes, such as bananas, oranges, potatoes, tomatoes, and spinach.  Keep all follow-up visits as told by your health care provider. This is important. Contact a health care provider if:  You have abdominal pain that: ? Gets worse. ? Stays in one area (localizes).  You have a rash.  You have a stiff neck.  You are more irritable than usual.  You are sleepier or more difficult to wake up than usual.  You feel weak or dizzy.  You feel very thirsty.  You have urinated only a small amount of very dark urine over 6-8 hours. Get help right away if:  You have symptoms of severe dehydration.  You cannot drink fluids without vomiting.  Your symptoms get worse with treatment.  You have a fever.  You have a severe headache.  You have vomiting or diarrhea that: ? Gets worse. ? Does not go away.  You have blood or green matter (bile) in your vomit.  You have blood in your stool. This may cause stool to look black and tarry.  You have not urinated in 6-8 hours.  You faint.  Your heart rate while sitting still is over 100 beats a minute.  You have trouble breathing. This information is not intended to replace advice given to you by your health care provider. Make sure you discuss any questions you have  with your health care provider. Document Released: 11/10/2005 Document Revised: 06/06/2016 Document Reviewed: 01/04/2016 Elsevier Interactive Patient Education  2019 [employer]  

## 2019-05-09 NOTE — Telephone Encounter (Signed)
I talk with patient regarding schedule  

## 2019-05-09 NOTE — Progress Notes (Signed)
DISCONTINUE ON PATHWAY REGIMEN - Pancreatic Adenocarcinoma     A cycle is every 14 days:     Oxaliplatin      Leucovorin      Irinotecan      5-Fluorouracil   **Always confirm dose/schedule in your pharmacy ordering system**  REASON: Toxicities / Adverse Event PRIOR TREATMENT: PANOS95: mFOLFIRINOX q14 Days x 4 Cycles TREATMENT RESPONSE: Unable to Evaluate  START OFF PATHWAY REGIMEN - Pancreatic Adenocarcinoma   [id number]:Nab-Paclitaxel (Abraxane) 125 mg/m2 D1, 8, 15 + Gemcitabine 1,000 mg/m2 D1, 8, 15 q28 Days:   A cycle is every 28 days:     Nab-paclitaxel (protein bound)      Gemcitabine   **Always confirm dose/schedule in your pharmacy ordering system**  Patient Characteristics: No Distant Metastases, Resectable, Neoadjuvant Therapy Not Planned Current evidence of distant metastases<= No AJCC T Category: T2 AJCC N Category: N0 AJCC M Category: M0 AJCC 8 Stage Grouping: IB Treatment Setting: Neoadjuvant Intent of Therapy: Curative Intent, Discussed with Patient

## 2019-05-10 ENCOUNTER — Inpatient Hospital Stay: Payer: Medicare HMO

## 2019-05-10 NOTE — Progress Notes (Signed)
Nutrition Follow-up:  RD working remotely.  Patient with pancreatic cancer with duodenal involvement.  Patient receiving neoadjuvant chemotherapy.  Noted Dr. [name] planning to change treatment regimen on 6/22.  Notes reviewed and patient coming into clinic for IV fluids.    Spoke with patient via phone today.  Patient reports that she is doing "ok".  Reports that her appetite maybe a little bit better.  Reports that she drank a carnation instant breakfast this am.  Has only had 1 loose stool today, not watery.  Reports yesterday had 2 loose, not watery stools and day before no loose stools.  Reports that she is out of nausea medication and planning to pick up more today.  Reports that she has been taking it.  Reports yesterday has grilled chicken salad from [facility] and tolerated without difficulty and ate banana. Also reports ate peanuts. Reports salty foods seem to work better for her.  Reports that she is sick of chicken.      Medications: reviewed  Labs: reviewed  Anthropometrics:   Weight is 123 lb on 6/12 decreased from 125 lb on 6/10   NUTRITION DIAGNOSIS: Inadequate oral intake continues   INTERVENTION:  Patient more receptive today to RD's discussion regarding foods to choose while having diarrhea/loose stools and nausea.   Encouraged patient to pick up nausea medication today.  Encouraged patient to continue to nibble on easy to digest foods every few hours.      MONITORING, EVALUATION, GOAL:  Patient will consume adequate calories and protein to maintain weight during treatment   NEXT VISIT: phone f/u Tuesday, June 23  Carley Strickling B. Zenia Resides, [place], [place] Registered Dietitian [phone] (pager)

## 2019-05-10 NOTE — Progress Notes (Signed)
Julie Rollins   [phone]) [phone] Fax:(336) [phone]   Clinic Follow up Note   Patient Care Team: Nolene Ebbs, MD as PCP - General (Internal Medicine) Stark Klein, MD as Consulting Physician (General Surgery) Truitt Merle, MD as Consulting Physician (Hematology) Alla Feeling, NP as Nurse Practitioner (Nurse Practitioner) Arna Snipe, RN as Oncology Nurse Navigator 05/11/2019  CHIEF COMPLAINT: f/u pancreas cancer   SUMMARY OF ONCOLOGIC HISTORY: Oncology History  Malignant neoplasm of head of pancreas ([name])  03/28/2019 Imaging   CT AP w contrast IMPRESSION: Interval development of 20 x 14 mm ill-defined low density in pancreatic head concerning for possible neoplasm or malignancy. This appears to be resulting in severe intrahepatic and extrahepatic biliary dilatation. ERCP is recommended for further evaluation.  Sigmoid diverticulosis without inflammation.   03/29/2019 Initial Biopsy   Diagnosis FINE NEEDLE ASPIRATION, ENDOSCOPIC EGUS, PANCREATIC HEAD MASS (SPECIMEN 1 OF 2 COLLECTED 03/29/2019) MALIGNANT CELLS CONSISTENT WITH ADENOCARCINOMA.   03/29/2019 Initial Biopsy   Diagnosis Duodenum, Biopsy, abnormal mucosa at major papilla - ADENOCARCINOMA. SEE NOTE Diagnosis Note The duodenal mucosa is negative for dysplasia. The findings are consistent with duodenal involvement from patient's suspected primary pancreatic adenocarcinoma. Dr. Melina Copa has reviewed this case and concurs with the above interpretation. Dr. Ardis Hughs was notified on 03/30/2019.   03/29/2019 Pathology Results   Diagnosis BILE DUCT BRUSHING (SPECIMEN 2 OF 2, COLLECTED ON 03/29/2019): HIGHLY SUSPICIOUS FOR MALIGNANCY.   03/29/2019 Procedure   ERCP with stenting of malignant distal CBD stricture per Dr. Ardis Hughs   04/04/2019 Initial Diagnosis   Malignant neoplasm of head of pancreas ([name])   04/08/2019 Imaging   CT Chest IMPRESSION: No evidence of metastatic disease in the chest. Aortic  Atherosclerosis (ICD10-I70.0).    04/13/2019 -  Chemotherapy   neoadjuvant FOLFIRINOX q3weeks beginning 04/13/19; dose-reduced with cycle 1   05/16/2019 -  Chemotherapy   The patient had PACLitaxel-protein bound (ABRAXANE) chemo infusion 150 mg, 100 mg/m2 = 150 mg (original dose ), Intravenous,  Once, 0 of 4 cycles Dose modification: 100 mg/m2 (Cycle 1, Reason: Provider Judgment) gemcitabine (GEMZAR) 1,596 mg in sodium chloride 0.9 % 100 mL chemo infusion, 1,000 mg/m2 = 1,596 mg, Intravenous,  Once, 0 of 4 cycles  for chemotherapy treatment.      CURRENT THERAPY: neoadjuvant FOLFIRINOX started 04/13/19, s/p cycle 2. Due to poor tolerance, diarrhea requiring frequent supportive care IVF, and limited social support regimen changed to gemcitabine and abraxane   INTERVAL HISTORY: [name] returns for f/u as scheduled. She is feeling somewhat better. Stools are thickening up, has 2-3 episodes per day. Only 1 stool so far today. Alternates lomotil or imodium after BM. Has mild nausea and decreased appetite. Takes zofran which helps. No vomiting. She thinks compazine contributes to loose stool. Takes ativan at night which helps her sleep.  She does not tolerate much foods. Not drinking much, she takes in approx 1 bottle of liquids per day. Has not taken potassium in a few days. Otherwise she denies fever, chills, cough, chest pain, dyspnea, edema, rash, mucositis, dizziness, or pain.     MEDICAL HISTORY:  Past Medical History:  Diagnosis Date   Anxiety    Blood transfusion without reported diagnosis    Breast CA ([place])    s/p lumpectomy and radiation 2000   Breast cancer ([place])    Colitis    2007   Hernia, epigastric    HTN (hypertension)    "mild hypertension", was on BP med years ago but it caused  orthostatic hypotension and she has not been medicated since   Hypothyroid    Personal history of radiation therapy 2001   Vertigo    Vertigo     SURGICAL HISTORY: Past Surgical  History:  Procedure Laterality Date   ABDOMINAL HYSTERECTOMY     5yrs ago   ANTERIOR CERVICAL DECOMP/DISCECTOMY FUSION N/A 08/02/2018   Procedure: ANTERIOR CERVICAL DECOMPRESSION/DISCECTOMY FUSION CERVICAL FIVE- CERVICAL SIX;  Surgeon: Earnie Larsson, MD;  Location: [facility];  Service: Neurosurgery;  Laterality: N/A;  ANTERIOR CERVICAL DECOMPRESSION/DISCECTOMY FUSION CERVICAL FIVE- CERVICAL SIX   BILIARY BRUSHING  03/29/2019   Procedure: BILIARY BRUSHING;  Surgeon: [name], MD;  Location: [facility] ENDOSCOPY;  Service: Endoscopy;;   BILIARY STENT PLACEMENT  03/29/2019   Procedure: BILIARY STENT PLACEMENT;  Surgeon: [name], MD;  Location[facility] END[facility]COPY;  Service: Endoscopy;;   BIOPSY  03/29/2019   Procedure: BIOPSY;  Surgeon: [name], MD;  Location: [facility] ENDOSCOPY;  Service: Endoscopy;;   BREAST LUMPECTOMY     2000   ERCP N/A 03/29/2019   Procedure: ENDOSCOPIC RETROGRADE CHOLANGIOPANCREATOGRAPHY (ERCP);  Surgeon: [name], MD;  Location: [facility] ENDOSCOPY;  Service: Endoscopy;  Laterality: N/A;   ESOPHAGOGASTRODUODENOSCOPY (EGD) WITH PROPOFOL N/A 03/29/2019   Procedure: ESOPHAGOGASTRODUODENOSCOPY (EGD) WITH PROPOFOL;  Surgeon: [name], MD;  Location: [facility] ENDOSCOPY;  Service: Endoscopy;  Laterality: N/A;   EUS N/A 03/29/2019   Procedure: UPPER ENDOSCOPIC ULTRASOUND (EUS) LINEAR;  Surgeon: [name], MD;  Location: [facility] ENDOSCOPY;  Service: Endoscopy;  Laterality: N/A;   FINE NEEDLE ASPIRATION  03/29/2019   Procedure: FINE NEEDLE ASPIRATION (FNA);  Surgeon: [name], MD;  Location: [facility] ENDOSCOPY;  Service: Endoscopy;;   HERNIA REPAIR     20+ years ago   PORTACATH PLACEMENT N/A 04/12/2019   Procedure: INSERTION PORT-A-CATH LEFT SUBCLAVIAN;  Surgeon: Stark Klein, MD;  Location: [place];  Service: General;  Laterality: N/A;   RETINAL DETACHMENT SURGERY     SPHINCTEROTOMY  03/29/2019   Procedure: SPHINCTEROTOMY;  Surgeon: [name], MD;   Location: [facility] ENDOSCOPY;  Service: Endoscopy;;    I have reviewed the social history and family history with the patient and they are unchanged from previous note.  ALLERGIES:  is allergic to codeine.  MEDICATIONS:  Current Outpatient Medications  Medication Sig Dispense Refill   acetaminophen (TYLENOL) 325 MG tablet Take 2 tablets (650 mg total) by mouth every 8 (eight) hours.     acetaminophen (TYLENOL) 500 MG tablet Take 1,000 mg by mouth every 6 (six) hours as needed.     citalopram (CELEXA) 20 MG tablet Take 20 mg by mouth at bedtime.     diltiazem (CARDIZEM CD) 180 MG 24 hr capsule Take 180 mg by mouth daily.     diphenoxylate-atropine (LOMOTIL) 2.5-0.025 MG tablet Take 2 tablets by mouth 4 (four) times daily as needed for diarrhea or loose stools. 60 tablet 1   HYDROcodone-acetaminophen (NORCO/VICODIN) 5-325 MG tablet Take 0.5-1 tablets by mouth every 6 (six) hours as needed for moderate pain or severe pain. 8 tablet 0   levothyroxine (SYNTHROID, LEVOTHROID) 50 MCG tablet Take 50 mcg by mouth every morning.     LORazepam (ATIVAN) 0.5 MG tablet Take 0.5 tablets (0.25 mg total) by mouth every 8 (eight) hours as needed for anxiety. 30 tablet 0   pantoprazole (PROTONIX) 20 MG tablet Take 1 tablet (20 mg total) by mouth 2 (two) times daily before a meal. 60 tablet 2  potassium chloride 20 MEQ/15ML (10%) SOLN Take 15 mLs (20 mEq total) by mouth daily. 473 mL 0   No current facility-administered medications for this visit.    Facility-Administered Medications Ordered in Other Visits  Medication Dose Route Frequency Provider Last Rate Last Dose   0.9 %  sodium chloride infusion   Intravenous Once Truitt Merle, MD        PHYSICAL EXAMINATION: ECOG PERFORMANCE STATUS: 1-2  Vitals:   05/11/19 1121  BP: 126/71  Pulse: (!) 107  Resp: 17  Temp: 98.5 F (36.9 C)  SpO2: 99%   Filed Weights   05/11/19 1121  Weight: 124 lb 3.2 oz (56.3 kg)    GENERAL:alert, no distress and  comfortable SKIN: no obvious rash  EYES:  sclera clear LUNGS: clear to auscultation with normal breathing effort HEART: tachycardic. Regular rhythm ABDOMEN:abdomen soft, non-tender and normal bowel sounds Musculoskeletal:no cyanosis of digits  NEURO: alert & oriented x 3 with fluent speech, normal gait PAC without erythema Limited exam for covid19 outbreak   LABORATORY DATA:  I have reviewed the data as listed CBC Latest Ref Rng & Units 05/11/2019 05/06/2019 05/04/2019  WBC 4.0 - 10.5 K/uL 1.8(L) 4.3 4.4  Hemoglobin 12.0 - 15.0 g/dL 12.0 13.1 12.4  Hematocrit 36.0 - 46.0 % 35.6(L) 39.0 36.5  Platelets 150 - 400 K/uL 241 209 188     CMP Latest Ref Rng & Units 05/11/2019 05/06/2019 05/04/2019  Glucose 70 - 99 mg/dL 124(H) 107(H) 127(H)  BUN 8 - 23 mg/dL 7(L) 8 6(L)  Creatinine 0.44 - 1.00 mg/dL 0.73 0.72 0.71  Sodium 135 - 145 mmol/L 139 136 137  Potassium 3.5 - 5.1 mmol/L 3.0(LL) 4.0 3.8  Chloride 98 - 111 mmol/L 104 101 103  CO2 22 - 32 mmol/L 26 22 24   Calcium 8.9 - 10.3 mg/dL 8.5(L) 9.2 8.7(L)  Total Protein 6.5 - 8.1 g/dL 5.7(L) 6.5 6.4(L)  Total Bilirubin 0.3 - 1.2 mg/dL 0.6 0.9 1.0  Alkaline Phos 38 - 126 U/L 127(H) 140(H) 128(H)  AST 15 - 41 U/L 38 65(H) 35  ALT 0 - 44 U/L 67(H) 96(H) 49(H)      RADIOGRAPHIC STUDIES: I have personally reviewed the radiological images as listed and agreed with the findings in the report. No results found.   ASSESSMENT & PLAN: 73 yo female with  1. Adenocarcinoma of pancreatic head, cT2N0M0, with duodenal involvement -diagnosed 03/2019, presented with painless juandices/p ERCP with stenting (bili 23.9).Staging work up was negative for metastatic disease. She was seen by Dr. Barry Dienes who feels her tumor is resectable. She recommended neoadjuvant chemotherapy; Dr. [name] recommended dose-reduced FOLFIRINOX due to her age and limited social support. Her brother recently passed away  -she completed cycle 1 on 04/13/19; she tolerated fairly  well with delayed nausea and vomiting, diarrhea, and moderate fatigue.  -s/p increased dose cycle 2 on 04/27/19, she tolerated poorly, she had increased nausea and diarrhea and 4 lbs weight loss.  -since cycle 2 she required frequent IVF support and [facility] visit for nausea and diarrhea. -Dr. [name] planned to change her neoadjuvant chemo regimen to less intenseive gemcitabine and abraxane 2 weeks on and 1 week off, plan to start next week. -Ms. Venti appears improved today. Her diarrhea has improved to lose stool now, 1-3 times per day. This is close to her pre-chemo baseline. -She is still not eating or drinking well, I reviewed this with her and encouraged her to push po. She agrees. She is followed by nutrition  -  HR 107, likely dehydration related to GI loss and poor po intake -she will get IVF today, NS + 20 mEq KCL for K 3.0, likely due to GI loss. She will increase po K to 20 mEq BID  -HR improved to 85 after hydration -Labs reviewed. ANC 0.3; afebrile. We reviewed neutropenic precautions. She knows to call clinic asap for shaking chills, fever, new cough, dyspnea, dysuria, or recurrent diarrhea.  -she will return for lab and IVF on 6/19. If [place] remains low she will get granix injection. Insurance approval is pending at this time.  -as of now, plan to f/u and begin cycle 1 gem/abraxane on 6/22, or later if [place] does not recover well  2. 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 Ardis Hughs on 03/29/19  -her juandice resolved, bili normalized   3. Chemotherapy induced nausea and vomiting (CINV), diarrhea -she had mild nausea after FOLFIRINOX cycle 1, which increased and she developed vomiting on day 6; 1 episode daily for 3 days -Emend was added with cycle 2, improved  -previously prescribed very low dose ativan 0.25 mg q8 PRN for refractory nausea and anxiety; I reviewed side effect profile, especially  drowsiness, and recommend she take it at night. She understands. she knows not to drive after taking this.  -nausea is better managed lately, takes zofran in the morning and ativan at night -diarrhea improved, now just lose BM 1-3 times per day. She takes lomotil and imodium PRN; I encouraged her to take before meals, she agrees   4. Lower leg edema  -Doppler5/22 negative for DVT, previously reviewed  - elevate legs during rest, and wear compression stockings if needed   5.Acid-related esophagitis with bleeding -found on EGD 03/29/19 per Dr. Ardis Hughs -continuepantoprazole 20 mg BID  6. Social support -She has used our transportation service with some issues on both ends -her neighbor drives her occasionally, pt feels like she is becoming a burden on her  -followed by SW Edwyna Shell  -Her chemo has been changed to less intensive regimen, which will likely make transportation easier   7. Genetics  -Due to her personal history of pancreas cancer, she qualifies for genetic testing.  -She cancelled appt on 05/05/19, she was not feeling well -has not been rescheduled yet   PLAN  -labs reviewed  -reviewed neutropenic precautions for ANC 0.3 -K 3.0 will receive 20 mEq KCL in 1 liter NS today -increase oral K to 20 mEq BID -return for lab, IVF on 6/19 - NS over 2 hours unless K remains low and needs replacement -granix if ANC not improved on 6/19, pending insurance approval -Return for f/u and cycle 1 gemcitabine and abraxane on 6/22   All questions were answered. The patient knows to call the clinic with any problems, questions or concerns. No barriers to learning was detected. I spent 20 minutes counseling the patient face to face. The total time spent in the appointment was 25 minutes and more than 50% was on counseling and review of test results     Alla Feeling, NP 05/11/19

## 2019-05-11 ENCOUNTER — Other Ambulatory Visit: Payer: Self-pay

## 2019-05-11 ENCOUNTER — Inpatient Hospital Stay: Payer: Medicare HMO

## 2019-05-11 ENCOUNTER — Encounter: Payer: Self-pay | Admitting: Nurse Practitioner

## 2019-05-11 ENCOUNTER — Inpatient Hospital Stay (HOSPITAL_BASED_OUTPATIENT_CLINIC_OR_DEPARTMENT_OTHER): Payer: Medicare HMO | Admitting: Nurse Practitioner

## 2019-05-11 ENCOUNTER — Ambulatory Visit: Payer: Medicare HMO

## 2019-05-11 ENCOUNTER — Telehealth: Payer: Self-pay | Admitting: *Deleted

## 2019-05-11 ENCOUNTER — Telehealth: Payer: Self-pay

## 2019-05-11 VITALS — BP 126/71 | HR 107 | Temp 98.5°F | Resp 17 | Ht 64.0 in | Wt 124.2 lb

## 2019-05-11 VITALS — BP 122/68 | HR 85

## 2019-05-11 DIAGNOSIS — R110 Nausea: Secondary | ICD-10-CM

## 2019-05-11 DIAGNOSIS — R600 Localized edema: Secondary | ICD-10-CM

## 2019-05-11 DIAGNOSIS — K208 Other esophagitis: Secondary | ICD-10-CM

## 2019-05-11 DIAGNOSIS — R630 Anorexia: Secondary | ICD-10-CM

## 2019-05-11 DIAGNOSIS — Z79899 Other long term (current) drug therapy: Secondary | ICD-10-CM

## 2019-05-11 DIAGNOSIS — E876 Hypokalemia: Secondary | ICD-10-CM

## 2019-05-11 DIAGNOSIS — R197 Diarrhea, unspecified: Secondary | ICD-10-CM | POA: Diagnosis not present

## 2019-05-11 DIAGNOSIS — C250 Malignant neoplasm of head of pancreas: Secondary | ICD-10-CM

## 2019-05-11 DIAGNOSIS — Z95828 Presence of other vascular implants and grafts: Secondary | ICD-10-CM

## 2019-05-11 DIAGNOSIS — Z923 Personal history of irradiation: Secondary | ICD-10-CM

## 2019-05-11 DIAGNOSIS — Z5111 Encounter for antineoplastic chemotherapy: Secondary | ICD-10-CM | POA: Diagnosis not present

## 2019-05-11 DIAGNOSIS — C784 Secondary malignant neoplasm of small intestine: Secondary | ICD-10-CM

## 2019-05-11 LAB — CMP (CANCER CENTER ONLY)
ALT: 67 U/L — ABNORMAL HIGH (ref 0–44)
AST: 38 U/L (ref 15–41)
Albumin: 3 g/dL — ABNORMAL LOW (ref 3.5–5.0)
Alkaline Phosphatase: 127 U/L — ABNORMAL HIGH (ref 38–126)
Anion gap: 9 (ref 5–15)
BUN: 7 mg/dL — ABNORMAL LOW (ref 8–23)
CO2: 26 mmol/L (ref 22–32)
Calcium: 8.5 mg/dL — ABNORMAL LOW (ref 8.9–10.3)
Chloride: 104 mmol/L (ref 98–111)
Creatinine: 0.73 mg/dL (ref 0.44–1.00)
GFR, Est AFR Am: >60 mL/min (ref >60)
GFR, Estimated: >60 mL/min (ref >60)
Glucose, Bld: 124 mg/dL — ABNORMAL HIGH (ref 70–99)
Potassium: 3 mmol/L — CL (ref 3.5–5.1)
Sodium: 139 mmol/L (ref 135–145)
Total Bilirubin: 0.6 mg/dL (ref 0.3–1.2)
Total Protein: 5.7 g/dL — ABNORMAL LOW (ref 6.5–8.1)

## 2019-05-11 LAB — CBC WITH DIFFERENTIAL (CANCER CENTER ONLY)
Abs Immature Granulocytes: 0.01 10*3/uL (ref 0.00–0.07)
Basophils Absolute: 0 10*3/uL (ref 0.0–0.1)
Basophils Relative: 2 %
Eosinophils Absolute: 0.3 10*3/uL (ref 0.0–0.5)
Eosinophils Relative: 15 %
HCT: 35.6 % — ABNORMAL LOW (ref 36.0–46.0)
Hemoglobin: 12 g/dL (ref 12.0–15.0)
Immature Granulocytes: 1 %
Lymphocytes Relative: 41 %
Lymphs Abs: 0.8 10*3/uL (ref 0.7–4.0)
MCH: 31.3 pg (ref 26.0–34.0)
MCHC: 33.7 g/dL (ref 30.0–36.0)
MCV: 92.7 fL (ref 80.0–100.0)
Monocytes Absolute: 0.4 10*3/uL (ref 0.1–1.0)
Monocytes Relative: 22 %
Neutro Abs: 0.3 10*3/uL — CL (ref 1.7–7.7)
Neutrophils Relative %: 19 %
Platelet Count: 241 10*3/uL (ref 150–400)
RBC: 3.84 MIL/uL — ABNORMAL LOW (ref 3.87–5.11)
RDW: 15.4 % (ref 11.5–15.5)
WBC Count: 1.8 10*3/uL — ABNORMAL LOW (ref 4.0–10.5)
nRBC: 0 % (ref 0.0–0.2)

## 2019-05-11 MED ORDER — SODIUM CHLORIDE 0.9 % IV SOLN: Freq: Once | INTRAVENOUS | Status: DC

## 2019-05-11 MED ORDER — POTASSIUM CHLORIDE IN NACL 20-0.9 MEQ/L-% IV SOLN: Freq: Once | INTRAVENOUS | Status: DC

## 2019-05-11 MED ORDER — SODIUM CHLORIDE 0.9% FLUSH
10.0000 mL | Freq: Once | INTRAVENOUS | Status: AC | PRN
  Administered 2019-05-11: 10 mL
  Filled 2019-05-11: qty 10

## 2019-05-11 MED ORDER — SODIUM CHLORIDE 0.9% FLUSH
10.0000 mL | INTRAVENOUS | Status: DC | PRN
  Administered 2019-05-11: 10 mL
  Filled 2019-05-11: qty 10

## 2019-05-11 MED ORDER — SODIUM CHLORIDE 0.9 % IV SOLN
Freq: Once | INTRAVENOUS | Status: DC
  Filled 2019-05-11: qty 250

## 2019-05-11 MED ORDER — HEPARIN SOD (PORK) LOCK FLUSH 100 UNIT/ML IV SOLN
500.0000 [IU] | Freq: Once | INTRAVENOUS | Status: AC | PRN
  Administered 2019-05-11: 500 [IU]
  Filled 2019-05-11: qty 5

## 2019-05-11 MED ORDER — POTASSIUM CHLORIDE IN NACL 20-0.9 MEQ/L-% IV SOLN
Freq: Once | INTRAVENOUS | Status: AC
  Administered 2019-05-11: 12:00:00 via INTRAVENOUS
  Filled 2019-05-11: qty 1000

## 2019-05-11 NOTE — Patient Instructions (Signed)

## 2019-05-11 NOTE — Telephone Encounter (Signed)
Informed Julie Rollins of appointment change; new appointments scheduled for Friday 05/13/19 starting at 1pm. Pt confirmed time and agreeable, needs transportation. Spoke with Deatra Canter in transportation and they will get her scheduled.

## 2019-05-11 NOTE — Telephone Encounter (Signed)
Received call report from [facility].  "Today's K+ = 3.0."  Secure Chat sent with results.   Scheduled with provider for F/U today.

## 2019-05-12 ENCOUNTER — Telehealth: Payer: Self-pay | Admitting: Nurse Practitioner

## 2019-05-12 ENCOUNTER — Other Ambulatory Visit: Payer: Self-pay | Admitting: Nurse Practitioner

## 2019-05-12 NOTE — Telephone Encounter (Signed)
Per 6/17 los appts already scheduled. °

## 2019-05-13 ENCOUNTER — Inpatient Hospital Stay: Payer: Medicare HMO

## 2019-05-13 ENCOUNTER — Other Ambulatory Visit: Payer: Self-pay

## 2019-05-13 VITALS — BP 112/67 | HR 86 | Temp 98.7°F | Resp 18

## 2019-05-13 DIAGNOSIS — Z5111 Encounter for antineoplastic chemotherapy: Secondary | ICD-10-CM | POA: Diagnosis not present

## 2019-05-13 DIAGNOSIS — Z95828 Presence of other vascular implants and grafts: Secondary | ICD-10-CM

## 2019-05-13 DIAGNOSIS — C250 Malignant neoplasm of head of pancreas: Secondary | ICD-10-CM

## 2019-05-13 LAB — CBC WITH DIFFERENTIAL (CANCER CENTER ONLY)
Abs Immature Granulocytes: 0.01 10*3/uL (ref 0.00–0.07)
Basophils Absolute: 0 10*3/uL (ref 0.0–0.1)
Basophils Relative: 1 %
Eosinophils Absolute: 0.4 10*3/uL (ref 0.0–0.5)
Eosinophils Relative: 11 %
HCT: 36.1 % (ref 36.0–46.0)
Hemoglobin: 12 g/dL (ref 12.0–15.0)
Immature Granulocytes: 0 %
Lymphocytes Relative: 31 %
Lymphs Abs: 1 10*3/uL (ref 0.7–4.0)
MCH: 31.4 pg (ref 26.0–34.0)
MCHC: 33.2 g/dL (ref 30.0–36.0)
MCV: 94.5 fL (ref 80.0–100.0)
Monocytes Absolute: 0.7 10*3/uL (ref 0.1–1.0)
Monocytes Relative: 22 %
Neutro Abs: 1.1 10*3/uL — ABNORMAL LOW (ref 1.7–7.7)
Neutrophils Relative %: 35 %
Platelet Count: 269 10*3/uL (ref 150–400)
RBC: 3.82 MIL/uL — ABNORMAL LOW (ref 3.87–5.11)
RDW: 15.8 % — ABNORMAL HIGH (ref 11.5–15.5)
WBC Count: 3.1 10*3/uL — ABNORMAL LOW (ref 4.0–10.5)
nRBC: 0 % (ref 0.0–0.2)

## 2019-05-13 LAB — CMP (CANCER CENTER ONLY)
ALT: 53 U/L — ABNORMAL HIGH (ref 0–44)
AST: 38 U/L (ref 15–41)
Albumin: 3 g/dL — ABNORMAL LOW (ref 3.5–5.0)
Alkaline Phosphatase: 151 U/L — ABNORMAL HIGH (ref 38–126)
Anion gap: 9 (ref 5–15)
BUN: 8 mg/dL (ref 8–23)
CO2: 25 mmol/L (ref 22–32)
Calcium: 8.2 mg/dL — ABNORMAL LOW (ref 8.9–10.3)
Chloride: 104 mmol/L (ref 98–111)
Creatinine: 0.69 mg/dL (ref 0.44–1.00)
GFR, Est AFR Am: >60 mL/min (ref >60)
GFR, Estimated: >60 mL/min (ref >60)
Glucose, Bld: 111 mg/dL — ABNORMAL HIGH (ref 70–99)
Potassium: 3.5 mmol/L (ref 3.5–5.1)
Sodium: 138 mmol/L (ref 135–145)
Total Bilirubin: 0.6 mg/dL (ref 0.3–1.2)
Total Protein: 5.8 g/dL — ABNORMAL LOW (ref 6.5–8.1)

## 2019-05-13 MED ORDER — SODIUM CHLORIDE 0.9% FLUSH
10.0000 mL | Freq: Once | INTRAVENOUS | Status: AC | PRN
  Administered 2019-05-13: 10 mL
  Filled 2019-05-13: qty 10

## 2019-05-13 MED ORDER — SODIUM CHLORIDE 0.9% FLUSH
10.0000 mL | INTRAVENOUS | Status: DC | PRN
  Administered 2019-05-13: 10 mL
  Filled 2019-05-13: qty 10

## 2019-05-13 MED ORDER — HEPARIN SOD (PORK) LOCK FLUSH 100 UNIT/ML IV SOLN
500.0000 [IU] | Freq: Once | INTRAVENOUS | Status: AC | PRN
  Administered 2019-05-13: 500 [IU]
  Filled 2019-05-13: qty 5

## 2019-05-13 MED ORDER — FILGRASTIM 480 MCG/0.8ML IJ SOSY
300.0000 ug | PREFILLED_SYRINGE | Freq: Once | INTRAMUSCULAR | Status: DC
  Filled 2019-05-13: qty 0.5

## 2019-05-13 MED ORDER — FILGRASTIM 480 MCG/1.6ML IJ SOLN
300.0000 ug | Freq: Once | INTRAMUSCULAR | Status: AC
  Administered 2019-05-13: 300 ug via SUBCUTANEOUS
  Filled 2019-05-13: qty 1.6

## 2019-05-13 MED ORDER — SODIUM CHLORIDE 0.9 % IV SOLN
Freq: Once | INTRAVENOUS | Status: AC
  Administered 2019-05-13: 14:00:00 via INTRAVENOUS
  Filled 2019-05-13: qty 250

## 2019-05-13 MED ORDER — FILGRASTIM 300 MCG/0.5ML IJ SOSY
300.0000 ug | PREFILLED_SYRINGE | Freq: Once | INTRAMUSCULAR | Status: DC
  Filled 2019-05-13: qty 0.5

## 2019-05-13 NOTE — Patient Instructions (Signed)
Diarrhea, Adult Diarrhea is when you pass loose and watery poop (stool) often. Diarrhea can make you feel weak and cause you to lose water in your body (get dehydrated). Losing water in your body can cause you to:  Feel tired and thirsty.  Have a dry mouth.  Go pee (urinate) less often. Diarrhea often lasts 2-3 days. However, it can last longer if it is a sign of something more serious. It is important to treat your diarrhea as told by your doctor. Follow these instructions at home: Eating and drinking     Follow these instructions as told by your doctor:  Take an ORS (oral rehydration solution). This is a drink that helps you replace fluids and minerals your body lost. It is sold at pharmacies and stores.  Drink plenty of fluids, such as: ? Water. ? Ice chips. ? Diluted fruit juice. ? Low-calorie sports drinks. ? Milk, if you want.  Avoid drinking fluids that have a lot of sugar or caffeine in them.  Eat bland, easy-to-digest foods in small amounts as you are able. These foods include: ? Bananas. ? Applesauce. ? Rice. ? Low-fat (lean) meats. ? Toast. ? Crackers.  Avoid alcohol.  Avoid spicy or fatty foods.  Medicines  Take over-the-counter and prescription medicines only as told by your doctor.  If you were prescribed an antibiotic medicine, take it as told by your doctor. Do not stop using the antibiotic even if you start to feel better. General instructions   Wash your hands often using soap and water. If soap and water are not available, use a hand sanitizer. Others in your home should wash their hands as well. Hands should be washed: ? After using the toilet or changing a diaper. ? Before preparing, cooking, or serving food. ? While caring for a sick person. ? While visiting someone in a hospital.  Drink enough fluid to keep your pee (urine) pale yellow.  Rest at home while you get better.  Watch your condition for any changes.  Take a warm bath to help  with any burning or pain from having diarrhea.  Keep all follow-up visits as told by your doctor. This is important. Contact a doctor if:  You have a fever.  Your diarrhea gets worse.  You have new symptoms.  You cannot keep fluids down.  You feel light-headed or dizzy.  You have a headache.  You have muscle cramps. Get help right away if:  You have chest pain.  You feel very weak or you pass out (faint).  You have bloody or black poop or poop that looks like tar.  You have very bad pain, cramping, or bloating in your belly (abdomen).  You have trouble breathing or you are breathing very quickly.  Your heart is beating very quickly.  Your skin feels cold and clammy.  You feel confused.  You have signs of losing too much water in your body, such as: ? Dark pee, very little pee, or no pee. ? Cracked lips. ? Dry mouth. ? Sunken eyes. ? Sleepiness. ? Weakness. Summary  Diarrhea is when you pass loose and watery poop (stool) often.  Diarrhea can make you feel weak and cause you to lose water in your body (get dehydrated).  Take an ORS (oral rehydration solution). This is a drink that is sold at pharmacies and stores.  Eat bland, easy-to-digest foods in small amounts as you are able.  Contact a doctor if your condition gets worse. Get help right   away if you have signs that you have lost too much water in your body. This information is not intended to replace advice given to you by your health care provider. Make sure you discuss any questions you have with your health care provider. Document Released: 04/28/2008 Document Revised: 04/16/2018 Document Reviewed: 04/16/2018 Elsevier Interactive Patient Education  2019 [employer]    Dehydration, Adult  Dehydration is when there is not enough fluid or water in your body. This happens when you lose more fluids than you take in. Dehydration can range from mild to very bad. It should be treated right away to keep it  from getting very bad. Symptoms of mild dehydration may include:  Thirst.  Dry lips.  Slightly dry mouth.  Dry, warm skin.  Dizziness. Symptoms of moderate dehydration may include:  Very dry mouth.  Muscle cramps.  Dark pee (urine). Pee may be the color of tea.  Your body making less pee.  Your eyes making fewer tears.  Heartbeat that is uneven or faster than normal (palpitations).  Headache.  Light-headedness, especially when you stand up from sitting.  Fainting (syncope). Symptoms of very bad dehydration may include:  Changes in skin, such as: ? Cold and clammy skin. ? Blotchy (mottled) or pale skin. ? Skin that does not quickly return to normal after being lightly pinched and let go (poor skin turgor).  Changes in body fluids, such as: ? Feeling very thirsty. ? Your eyes making fewer tears. ? Not sweating when body temperature is high, such as in hot weather. ? Your body making very little pee.  Changes in vital signs, such as: ? Weak pulse. ? Pulse that is more than 100 beats a minute when you are sitting still. ? Fast breathing. ? Low blood pressure.  Other changes, such as: ? Sunken eyes. ? Cold hands and feet. ? Confusion. ? Lack of energy (lethargy). ? Trouble waking up from sleep. ? Short-term weight loss. ? Unconsciousness. Follow these instructions at home:   If told by your doctor, drink an ORS: ? Make an ORS by using instructions on the package. ? Start by drinking small amounts, about  cup (120 mL) every 5-10 minutes. ? Slowly drink more until you have had the amount that your doctor said to have.  Drink enough clear fluid to keep your pee clear or pale yellow. If you were told to drink an ORS, finish the ORS first, then start slowly drinking clear fluids. Drink fluids such as: ? Water. Do not drink only water by itself. Doing that can make the salt (sodium) level in your body get too low (hyponatremia). ? Ice chips. ? Fruit juice  that you have added water to (diluted). ? Low-calorie sports drinks.  Avoid: ? Alcohol. ? Drinks that have a lot of sugar. These include high-calorie sports drinks, fruit juice that does not have water added, and soda. ? Caffeine. ? Foods that are greasy or have a lot of fat or sugar.  Take over-the-counter and prescription medicines only as told by your doctor.  Do not take salt tablets. Doing that can make the salt level in your body get too high (hypernatremia).  Eat foods that have minerals (electrolytes). Examples include bananas, oranges, potatoes, tomatoes, and spinach.  Keep all follow-up visits as told by your doctor. This is important. Contact a doctor if:  You have belly (abdominal) pain that: ? Gets worse. ? Stays in one area (localizes).  You have a rash.  You have  a stiff neck.  You get angry or annoyed more easily than normal (irritability).  You are more sleepy than normal.  You have a harder time waking up than normal.  You feel: ? Weak. ? Dizzy. ? Very thirsty.  You have peed (urinated) only a small amount of very dark pee during 6-8 hours. Get help right away if:  You have symptoms of very bad dehydration.  You cannot drink fluids without throwing up (vomiting).  Your symptoms get worse with treatment.  You have a fever.  You have a very bad headache.  You are throwing up or having watery poop (diarrhea) and it: ? Gets worse. ? Does not go away.  You have blood or something green (bile) in your throw-up.  You have blood in your poop (stool). This may cause poop to look black and tarry.  You have not peed in 6-8 hours.  You pass out (faint).  Your heart rate when you are sitting still is more than 100 beats a minute.  You have trouble breathing. This information is not intended to replace advice given to you by your health care provider. Make sure you discuss any questions you have with your health care provider. Document Released:  09/06/2009 Document Revised: 05/30/2016 Document Reviewed: 01/04/2016 Elsevier Interactive Patient Education  2019 [employer].

## 2019-05-15 ENCOUNTER — Other Ambulatory Visit: Payer: Self-pay | Admitting: Hematology

## 2019-05-16 ENCOUNTER — Telehealth: Payer: Self-pay

## 2019-05-16 ENCOUNTER — Inpatient Hospital Stay: Payer: Medicare HMO

## 2019-05-16 ENCOUNTER — Encounter: Payer: Self-pay | Admitting: Nurse Practitioner

## 2019-05-16 ENCOUNTER — Inpatient Hospital Stay (HOSPITAL_BASED_OUTPATIENT_CLINIC_OR_DEPARTMENT_OTHER): Payer: Medicare HMO | Admitting: Nurse Practitioner

## 2019-05-16 ENCOUNTER — Other Ambulatory Visit: Payer: Self-pay

## 2019-05-16 ENCOUNTER — Telehealth: Payer: Self-pay | Admitting: Nurse Practitioner

## 2019-05-16 VITALS — BP 117/64 | HR 103 | Temp 98.3°F | Resp 17 | Ht 64.0 in | Wt 123.5 lb

## 2019-05-16 DIAGNOSIS — D490 Neoplasm of unspecified behavior of digestive system: Secondary | ICD-10-CM

## 2019-05-16 DIAGNOSIS — E876 Hypokalemia: Secondary | ICD-10-CM

## 2019-05-16 DIAGNOSIS — Z95828 Presence of other vascular implants and grafts: Secondary | ICD-10-CM

## 2019-05-16 DIAGNOSIS — C250 Malignant neoplasm of head of pancreas: Secondary | ICD-10-CM | POA: Diagnosis not present

## 2019-05-16 DIAGNOSIS — E039 Hypothyroidism, unspecified: Secondary | ICD-10-CM

## 2019-05-16 DIAGNOSIS — C784 Secondary malignant neoplasm of small intestine: Secondary | ICD-10-CM

## 2019-05-16 DIAGNOSIS — R197 Diarrhea, unspecified: Secondary | ICD-10-CM

## 2019-05-16 DIAGNOSIS — Z5111 Encounter for antineoplastic chemotherapy: Secondary | ICD-10-CM | POA: Diagnosis not present

## 2019-05-16 DIAGNOSIS — Z923 Personal history of irradiation: Secondary | ICD-10-CM

## 2019-05-16 DIAGNOSIS — I10 Essential (primary) hypertension: Secondary | ICD-10-CM

## 2019-05-16 LAB — CMP (CANCER CENTER ONLY)
ALT: 90 U/L — ABNORMAL HIGH (ref 0–44)
AST: 126 U/L — ABNORMAL HIGH (ref 15–41)
Albumin: 3 g/dL — ABNORMAL LOW (ref 3.5–5.0)
Alkaline Phosphatase: 195 U/L — ABNORMAL HIGH (ref 38–126)
Anion gap: 11 (ref 5–15)
BUN: 8 mg/dL (ref 8–23)
CO2: 26 mmol/L (ref 22–32)
Calcium: 8.6 mg/dL — ABNORMAL LOW (ref 8.9–10.3)
Chloride: 102 mmol/L (ref 98–111)
Creatinine: 0.77 mg/dL (ref 0.44–1.00)
GFR, Est AFR Am: >60 mL/min (ref >60)
GFR, Estimated: >60 mL/min (ref >60)
Glucose, Bld: 172 mg/dL — ABNORMAL HIGH (ref 70–99)
Potassium: 3.1 mmol/L — ABNORMAL LOW (ref 3.5–5.1)
Sodium: 139 mmol/L (ref 135–145)
Total Bilirubin: 0.6 mg/dL (ref 0.3–1.2)
Total Protein: 5.8 g/dL — ABNORMAL LOW (ref 6.5–8.1)

## 2019-05-16 LAB — CBC WITH DIFFERENTIAL (CANCER CENTER ONLY)
Abs Immature Granulocytes: 0.1 10*3/uL — ABNORMAL HIGH (ref 0.00–0.07)
Basophils Absolute: 0.1 10*3/uL (ref 0.0–0.1)
Basophils Relative: 1 %
Eosinophils Absolute: 0.2 10*3/uL (ref 0.0–0.5)
Eosinophils Relative: 2 %
HCT: 39.2 % (ref 36.0–46.0)
Hemoglobin: 13.1 g/dL (ref 12.0–15.0)
Immature Granulocytes: 1 %
Lymphocytes Relative: 10 %
Lymphs Abs: 1.1 10*3/uL (ref 0.7–4.0)
MCH: 31.3 pg (ref 26.0–34.0)
MCHC: 33.4 g/dL (ref 30.0–36.0)
MCV: 93.8 fL (ref 80.0–100.0)
Monocytes Absolute: 0.8 10*3/uL (ref 0.1–1.0)
Monocytes Relative: 8 %
Neutro Abs: 8.3 10*3/uL — ABNORMAL HIGH (ref 1.7–7.7)
Neutrophils Relative %: 78 %
Platelet Count: 322 10*3/uL (ref 150–400)
RBC: 4.18 MIL/uL (ref 3.87–5.11)
RDW: 15.7 % — ABNORMAL HIGH (ref 11.5–15.5)
WBC Count: 10.6 10*3/uL — ABNORMAL HIGH (ref 4.0–10.5)
nRBC: 0 % (ref 0.0–0.2)

## 2019-05-16 MED ORDER — SODIUM CHLORIDE 0.9 % IV SOLN
Freq: Once | INTRAVENOUS | Status: AC
  Administered 2019-05-16: 10:00:00 via INTRAVENOUS
  Filled 2019-05-16: qty 1000

## 2019-05-16 MED ORDER — SODIUM CHLORIDE 0.9% FLUSH
10.0000 mL | Freq: Once | INTRAVENOUS | Status: AC | PRN
  Administered 2019-05-16: 10 mL
  Filled 2019-05-16: qty 10

## 2019-05-16 MED ORDER — SODIUM CHLORIDE 0.9% FLUSH
10.0000 mL | INTRAVENOUS | Status: DC | PRN
  Administered 2019-05-16: 08:00:00 10 mL
  Filled 2019-05-16: qty 10

## 2019-05-16 MED ORDER — PANCRELIPASE (LIP-PROT-AMYL) 12000-38000 UNITS PO CPEP: ORAL_CAPSULE | ORAL | 1 refills | Status: DC

## 2019-05-16 MED ORDER — MIRTAZAPINE 7.5 MG PO TABS: 7.5000 mg | ORAL_TABLET | Freq: Every day | ORAL | 1 refills | Status: DC

## 2019-05-16 MED ORDER — HEPARIN SOD (PORK) LOCK FLUSH 100 UNIT/ML IV SOLN
500.0000 [IU] | Freq: Once | INTRAVENOUS | Status: AC | PRN
  Administered 2019-05-16: 500 [IU]
  Filled 2019-05-16: qty 5

## 2019-05-16 NOTE — Patient Instructions (Signed)
Hypokalemia Hypokalemia means that the amount of potassium in the blood is lower than normal.Potassium is a chemical that helps regulate the amount of fluid in the body (electrolyte). It also stimulates muscle tightening (contraction) and helps nerves work properly.Normally, most of the body's potassium is inside of cells, and only a very small amount is in the blood. Because the amount in the blood is so small, minor changes to potassium levels in the blood can be life-threatening. What are the causes? This condition may be caused by:  Antibiotic medicine.  Diarrhea or vomiting. Taking too much of a medicine that helps you have a bowel movement (laxative) can cause diarrhea and lead to hypokalemia.  Chronic kidney disease (CKD).  Medicines that help the body get rid of excess fluid (diuretics).  Eating disorders, such as bulimia.  Low magnesium levels in the body.  Sweating a lot. What are the signs or symptoms? Symptoms of this condition include:  Weakness.  Constipation.  Fatigue.  Muscle cramps.  Mental confusion.  Skipped heartbeats or irregular heartbeat (palpitations).  Tingling or numbness. How is this diagnosed? This condition is diagnosed with a blood test. How is this treated? Hypokalemia can be treated by taking potassium supplements by mouth or adjusting the medicines that you take. Treatment may also include eating more foods that contain a lot of potassium. If your potassium level is very low, you may need to get potassium through an IV tube in one of your veins and be monitored in the hospital. Follow these instructions at home:   Take over-the-counter and prescription medicines only as told by your health care provider. This includes vitamins and supplements.  Eat a healthy diet. A healthy diet includes fresh fruits and vegetables, whole grains, healthy fats, and lean proteins.  If instructed, eat more foods that contain a lot of potassium, such as: ?  Nuts, such as peanuts and pistachios. ? Seeds, such as sunflower seeds and pumpkin seeds. ? Peas, lentils, and lima beans. ? Whole grain and bran cereals and breads. ? Fresh fruits and vegetables, such as apricots, avocado, bananas, cantaloupe, kiwi, oranges, tomatoes, asparagus, and potatoes. ? Orange juice. ? Tomato juice. ? Red meats. ? Yogurt.  Keep all follow-up visits as told by your health care provider. This is important. Contact a health care provider if:  You have weakness that gets worse.  You feel your heart pounding or racing.  You vomit.  You have diarrhea.  You have diabetes (diabetes mellitus) and you have trouble keeping your blood sugar (glucose) in your target range. Get help right away if:  You have chest pain.  You have shortness of breath.  You have vomiting or diarrhea that lasts for more than 2 days.  You faint. This information is not intended to replace advice given to you by your health care provider. Make sure you discuss any questions you have with your health care provider. Document Released: 11/10/2005 Document Revised: 06/28/2016 Document Reviewed: 06/28/2016 Elsevier Interactive Patient Education  2019 [employer]   Rehydration, Adult Rehydration is the replacement of body fluids and salts and minerals (electrolytes) that are lost during dehydration. Dehydration is when there is not enough fluid or water in the body. This happens when you lose more fluids than you take in. Common causes of dehydration include:  Vomiting.  Diarrhea.  Excessive sweating, such as from heat exposure or exercise.  Taking medicines that cause the body to lose excess fluid (diuretics).  Impaired kidney function.  Not drinking  enough fluid.  Certain illnesses or infections.  Certain poorly controlled long-term (chronic) illnesses, such as diabetes, heart disease, and kidney disease.  Symptoms of mild dehydration may include thirst, dry lips and mouth,  dry skin, and dizziness. Symptoms of severe dehydration may include increased heart rate, confusion, fainting, and not urinating. You can rehydrate by drinking certain fluids or getting fluids through an IV tube, as told by your health care provider. What are the risks? Generally, rehydration is safe. However, one problem that can happen is taking in too much fluid (overhydration). This is rare. If overhydration happens, it can cause an electrolyte imbalance, kidney failure, or a decrease in salt (sodium) levels in the body. How to rehydrate Follow instructions from your health care provider for rehydration. The kind of fluid you should drink and the amount you should drink depend on your condition.  If directed by your health care provider, drink an oral rehydration solution (ORS). This is a drink designed to treat dehydration that is found in pharmacies and retail stores. ? Make an ORS by following instructions on the package. ? Start by drinking small amounts, about  cup (120 mL) every 5-10 minutes. ? Slowly increase how much you drink until you have taken the amount recommended by your health care provider.  Drink enough clear fluids to keep your urine clear or pale yellow. If you were instructed to drink an ORS, finish the ORS first, then start slowly drinking other clear fluids. Drink fluids such as: ? Water. Do not drink only water. Doing that can lead to having too little sodium in your body (hyponatremia). ? Ice chips. ? Fruit juice that you have added water to (diluted juice). ? Low-calorie sports drinks.  If you are severely dehydrated, your health care provider may recommend that you receive fluids through an IV tube in the hospital.  Do not take sodium tablets. Doing that can lead to the condition of having too much sodium in your body (hypernatremia). Eating while you rehydrate Follow instructions from your health care provider about what to eat while you rehydrate. Your health  care provider may recommend that you slowly begin eating regular foods in small amounts.  Eat foods that contain a healthy balance of electrolytes, such as bananas, oranges, potatoes, tomatoes, and spinach.  Avoid foods that are greasy or contain a lot of fat or sugar.  In some cases, you may get nutrition through a feeding tube that is passed through your nose and into your stomach (nasogastric tube, or NG tube). This may be done if you have uncontrolled vomiting or diarrhea. Beverages to avoid Certain beverages may make dehydration worse. While you rehydrate, avoid:  Alcohol.  Caffeine.  Drinks that contain a lot of sugar. These include: ? High-calorie sports drinks. ? Fruit juice that is not diluted. ? Soda.  Check nutrition labels to see how much sugar or caffeine a beverage contains. Signs of dehydration recovery You may be recovering from dehydration if:  You are urinating more often than before you started rehydrating.  Your urine is clear or pale yellow.  Your energy level improves.  You vomit less frequently.  You have diarrhea less frequently.  Your appetite improves or returns to normal.  You feel less dizzy or less light-headed.  Your skin tone and color start to look more normal. Contact a health care provider if:  You continue to have symptoms of mild dehydration, such as: ? Thirst. ? Dry lips. ? Slightly dry mouth. ? Dry,  warm skin. ? Dizziness.  You continue to vomit or have diarrhea. Get help right away if:  You have symptoms of dehydration that get worse.  You feel: ? Confused. ? Weak. ? Like you are going to faint.  You have not urinated in 6-8 hours.  You have very dark urine.  You have trouble breathing.  Your heart rate while sitting still is over 100 beats a minute.  You cannot drink fluids without vomiting.  You have vomiting or diarrhea that: ? Gets worse. ? Does not go away.  You have a fever. This information is not  intended to replace advice given to you by your health care provider. Make sure you discuss any questions you have with your health care provider. Document Released: 02/02/2012 Document Revised: 05/30/2016 Document Reviewed: 01/04/2016 Elsevier Interactive Patient Education  2019 [employer].

## 2019-05-16 NOTE — Telephone Encounter (Signed)
Scheduled appt per 6/22 los. Left a voice message of appt date and time.

## 2019-05-16 NOTE — Progress Notes (Addendum)
[place]   [phone] [phone]   Clinic Follow up Note   Patient Care Team: Nolene Ebbs, MD as PCP - General (Internal Medicine) Stark Klein, MD as Consulting Physician (General Surgery) Truitt Merle, MD as Consulting Physician (Hematology) Alla Feeling, NP as Nurse Practitioner (Nurse Practitioner) Arna Snipe, RN as Oncology Nurse Navigator 05/16/2019  CHIEF COMPLAINT: f/u pancreas cancer    SUMMARY OF ONCOLOGIC HISTORY: Oncology History  Malignant neoplasm of head of pancreas ([place])  03/28/2019 Imaging   CT AP w contrast IMPRESSION: Interval development of 20 x 14 mm ill-defined low density in pancreatic head concerning for possible neoplasm or malignancy. This appears to be resulting in severe intrahepatic and extrahepatic biliary dilatation. ERCP is recommended for further evaluation.  Sigmoid diverticulosis without inflammation.   03/29/2019 Initial Biopsy   Diagnosis FINE NEEDLE ASPIRATION, ENDOSCOPIC EGUS, PANCREATIC HEAD MASS (SPECIMEN 1 OF 2 COLLECTED 03/29/2019) MALIGNANT CELLS CONSISTENT WITH ADENOCARCINOMA.   03/29/2019 Initial Biopsy   Diagnosis Duodenum, Biopsy, abnormal mucosa at major papilla - ADENOCARCINOMA. SEE NOTE Diagnosis Note The duodenal mucosa is negative for dysplasia. The findings are consistent with duodenal involvement from patient's suspected primary pancreatic adenocarcinoma. Dr. Melina Copa has reviewed this case and concurs with the above interpretation. Dr. Ardis Hughs was notified on 03/30/2019.   03/29/2019 Pathology Results   Diagnosis BILE DUCT BRUSHING (SPECIMEN 2 OF 2, COLLECTED ON 03/29/2019): HIGHLY SUSPICIOUS FOR MALIGNANCY.   03/29/2019 Procedure   ERCP with stenting of malignant distal CBD stricture per Dr. Ardis Hughs   04/04/2019 Initial Diagnosis   Malignant neoplasm of head of pancreas ([place])   04/08/2019 Imaging   CT Chest IMPRESSION: No evidence of metastatic disease in the chest. Aortic  Atherosclerosis (ICD10-I70.0).    04/13/2019 -  Chemotherapy   neoadjuvant FOLFIRINOX q3weeks beginning 04/13/19; dose-reduced with cycle 1   05/16/2019 -  Chemotherapy   The patient had PACLitaxel-protein bound (ABRAXANE) chemo infusion 150 mg, 100 mg/m2 = 150 mg (100 % of original dose 100 mg/m2), Intravenous,  Once, 0 of 4 cycles Dose modification: 100 mg/m2 (original dose 100 mg/m2, Cycle 1, Reason: Provider Judgment) gemcitabine (GEMZAR) 1,596 mg in sodium chloride 0.9 % 100 mL chemo infusion, 1,000 mg/m2 = 1,596 mg, Intravenous,  Once, 0 of 4 cycles  for chemotherapy treatment.      CURRENT THERAPY:  1. neoadjuvant FOLFIRINOX started 04/13/19, s/p cycle 2. Due to poor tolerance, diarrhea requiring frequent supportive care IVF, and limited social support regimen changed to gemcitabine and abraxane  2. PENDING Gemcitabine and abraxane day 1 and day 8 every 21 days, on hold as of 05/16/19   INTERVAL HISTORY: Ms. Homen returns for f/u and to begin new chemo regimen as scheduled. Last chemo 04/27/19. She was last seen in clinic on 6/17 for f/u and IVF. She was neutropenic with ANC 0.3. She returned 6/19 for supportive IVF and received neupogen for ANC 1.1.  Had a headache after injection. She continues to have loose stool, 1-4 per day. Alternates lomotil and imodium BID. Noticed blood in stool on 6/19 but none since. She has h/o internal hemorrhoids. Has intermittent nausea with vomiting, takes ativan which helps. Notes intermittent LLQ pain. Cannot eat much, no appetite. Drinks carnation breakfast occasionally. Had recent call with dietician. Drinks maybe 1 water bottle per day. Denies dizziness on standing. She was very fatigued over the weekend, was not able to do much chores at home. Mild dyspnea on exertion. She is slightly depressed due to limited social support  and interaction. Denies cough, chest pain, fever, chills.    MEDICAL HISTORY:  Past Medical History:  Diagnosis Date  . Anxiety    . Blood transfusion without reported diagnosis   . Breast CA ([place])    s/p lumpectomy and radiation 2000  . Breast cancer ([place])   . Colitis    2007  . Hernia, epigastric   . HTN (hypertension)    "mild hypertension", was on BP med years ago but it caused orthostatic hypotension and she has not been medicated since  . Hypothyroid   . Personal history of radiation therapy 2001  . Vertigo   . Vertigo     SURGICAL HISTORY: Past Surgical History:  Procedure Laterality Date  . ABDOMINAL HYSTERECTOMY     48yr ago  . ANTERIOR CERVICAL DECOMP/DISCECTOMY FUSION N/A 08/02/2018   Procedure: ANTERIOR CERVICAL DECOMPRESSION/DISCECTOMY FUSION CERVICAL FIVE- CERVICAL SIX;  Surgeon: PEarnie Larsson MD;  [facility]  Service: Neurosurgery;  Laterality: N/A;  ANTERIOR CERVICAL DECOMPRESSION/DISCECTOMY FUSION CERVICAL FIVE- CERVICAL SIX  . BILIARY BRUSHING  03/29/2019   Procedure: BILIARY BRUSHING;  Surgeon: JMilus Banister MD;  [facility];  Service: Endoscopy;;  . BILIARY STENT PLACEMENT  03/29/2019   Procedure: BILIARY STENT PLACEMENT;  Surgeon: JMilus Banister MD;  [facility];  Service: Endoscopy;;  . BIOPSY  03/29/2019   Procedure: BIOPSY;  Surgeon: JMilus Banister MD;  [facility];  Service: Endoscopy;;  . BREAST LUMPECTOMY     2000  . ERCP N/A 03/29/2019   Procedure: ENDOSCOPIC RETROGRADE CHOLANGIOPANCREATOGRAPHY (ERCP);  Surgeon: JMilus Banister MD;  [facility];  Service: Endoscopy;  Laterality: N/A;  . ESOPHAGOGASTRODUODENOSCOPY (EGD) WITH PROPOFOL N/A 03/29/2019   Procedure: ESOPHAGOGASTRODUODENOSCOPY (EGD) WITH PROPOFOL;  Surgeon: JMilus Banister MD;  [facility];  Service: Endoscopy;  Laterality: N/A;  . EUS N/A 03/29/2019   Procedure: UPPER ENDOSCOPIC ULTRASOUND (EUS) LINEAR;  Surgeon: JMilus Banister MD;  [facility];  Service: Endoscopy;  Laterality: N/A;  . FINE NEEDLE ASPIRATION  03/29/2019   Procedure: FINE NEEDLE  ASPIRATION (FNA);  Surgeon: JMilus Banister MD;  [facility];  Service: Endoscopy;;  . HERNIA REPAIR     20+ years ago  . PORTACATH PLACEMENT N/A 04/12/2019   Procedure: INSERTION PORT-A-CATH LEFT SUBCLAVIAN;  Surgeon: BStark Klein MD;  [place] [facility]  Service: General;  Laterality: N/A;  . RETINAL DETACHMENT SURGERY    . SPHINCTEROTOMY  03/29/2019   Procedure: SPHINCTEROTOMY;  Surgeon: JMilus Banister MD;  [facility];  Service: Endoscopy;;    I have reviewed the social history and family history with the patient and they are unchanged from previous note.  ALLERGIES:  is allergic to codeine.  MEDICATIONS:  Current Outpatient Medications  Medication Sig Dispense Refill  . acetaminophen (TYLENOL) 325 MG tablet Take 2 tablets (650 mg total) by mouth every 8 (eight) hours.    .Marland Kitchenacetaminophen (TYLENOL) 500 MG tablet Take 1,000 mg by mouth every 6 (six) hours as needed.    . diltiazem (CARDIZEM CD) 180 MG 24 hr capsule Take 180 mg by mouth daily.    . diphenoxylate-atropine (LOMOTIL) 2.5-0.025 MG tablet Take 2 tablets by mouth 4 (four) times daily as needed for diarrhea or loose stools. 60 tablet 1  . HYDROcodone-acetaminophen (NORCO/VICODIN) 5-325 MG tablet Take 0.5-1 tablets by mouth every 6 (six) hours as needed for moderate pain or severe pain. 8 tablet 0  . levothyroxine (SYNTHROID, LEVOTHROID)  50 MCG tablet Take 50 mcg by mouth every morning.    Marland Kitchen LORazepam (ATIVAN) 0.5 MG tablet Take 0.5 tablets (0.25 mg total) by mouth every 8 (eight) hours as needed for anxiety. 30 tablet 0  . pantoprazole (PROTONIX) 20 MG tablet Take 1 tablet (20 mg total) by mouth 2 (two) times daily before a meal. 60 tablet 2  . potassium chloride 20 MEQ/15ML (10%) SOLN Take 15 mLs (20 mEq total) by mouth daily. 473 mL 0  . lipase/protease/amylase (CREON) 12000 units CPEP capsule Take 1 capsule with snack and 2 capsules with meals daily 270 capsule 1  . mirtazapine  (REMERON) 7.5 MG tablet Take 1 tablet (7.5 mg total) by mouth at bedtime. 30 tablet 1   Current Facility-Administered Medications  Medication Dose Route Frequency Provider Last Rate Last Dose  . sodium chloride 0.9 % 1,000 mL with potassium chloride 20 mEq infusion   Intravenous Once Cira Rue K, NP 500 mL/hr at 05/16/19 0954      PHYSICAL EXAMINATION: ECOG PERFORMANCE STATUS: 2-3  Vitals:   05/16/19 0851  BP: 117/64  Pulse: (!) 103  Resp: 17  Temp: 98.3 F (36.8 C)  SpO2: 98%   Filed Weights   05/16/19 0851  Weight: 123 lb 8 oz (56 kg)    GENERAL:alert, no distress. Presents in wheelchair  SKIN: no obvious rash  EYES: sclera clear OROPHARYNX: no thrush or ulcers LUNGS: respirations even and unlabored  HEART:  no lower extremity edema ABDOMEN:abdomen soft, non-tender Musculoskeletal:no cyanosis of digits  NEURO: alert & oriented x 3 with fluent speech PAC without erythema  Limited exam for covid19 outbreak    LABORATORY DATA:  I have reviewed the data as listed CBC Latest Ref Rng & Units 05/16/2019 05/13/2019 05/11/2019  WBC 4.0 - 10.5 K/uL 10.6(H) 3.1(L) 1.8(L)  Hemoglobin 12.0 - 15.0 g/dL 13.1 12.0 12.0  Hematocrit 36.0 - 46.0 % 39.2 36.1 35.6(L)  Platelets 150 - 400 K/uL 322 269 241     CMP Latest Ref Rng & Units 05/16/2019 05/13/2019 05/11/2019  Glucose 70 - 99 mg/dL 172(H) 111(H) 124(H)  BUN 8 - 23 mg/dL 8 8 7(L)  Creatinine 0.44 - 1.00 mg/dL 0.77 0.69 0.73  Sodium 135 - 145 mmol/L 139 138 139  Potassium 3.5 - 5.1 mmol/L 3.1(L) 3.5 3.0(LL)  Chloride 98 - 111 mmol/L 102 104 104  CO2 22 - 32 mmol/L '26 25 26  '$ Calcium 8.9 - 10.3 mg/dL 8.6(L) 8.2(L) 8.5(L)  Total Protein 6.5 - 8.1 g/dL 5.8(L) 5.8(L) 5.7(L)  Total Bilirubin 0.3 - 1.2 mg/dL 0.6 0.6 0.6  Alkaline Phos 38 - 126 U/L 195(H) 151(H) 127(H)  AST 15 - 41 U/L 126(H) 38 38  ALT 0 - 44 U/L 90(H) 53(H) 67(H)      RADIOGRAPHIC STUDIES: I have personally reviewed the radiological images as listed and  agreed with the findings in the report. No results found.   ASSESSMENT & PLAN: 73 yo female with  1. Adenocarcinoma of pancreatic head, cT2N0M0, with duodenal involvement -diagnosed 03/2019, presented with painless juandices/p ERCP with stenting (bili 23.9).Staging work up was negative for metastatic disease. She was seen by Dr. Barry Dienes who feels her tumor is resectable. She recommended neoadjuvant chemotherapy; Dr. [name] recommended dose-reduced FOLFIRINOX due to her age and limited social support. Her brother recently passed away  -she completed cycle 1 on 04/13/19; she tolerated fairly well with delayed nausea and vomiting, diarrhea, and moderate fatigue. -s/p increased dose cycle 2 on 04/27/19, she tolerated  poorly, she had increased nausea and diarrhea and 4 lbs weight loss.  -since cycle 2 she required frequent IVF support and [facility] visit for nausea and diarrhea. She received IVF +20 K last week. She received neupogen on 6/19 for neutropenia. -Dr. Mordecai Rasmussen her neoadjuvant chemo regimen to less intenseive gemcitabine and abraxane 2 weeks on and 1 week off, planned to start this week, however, Ms. Zinn continues to have n/v, loose stool, intermittent LLQ pain, and no appetite.  -The patient was seen with Dr. [name] today. We will continue to hold chemo for now to allow her to recover, get stronger. Will repeat CT AP per pancreatic protocol to evaluate response to chemo thus far and to explore cause for GI symptoms.  -plan to discuss CT in tumor board -labs reviewed. K 3.1, LFTs further increased. [place] recovered  -she will get 1 L NS + 20 KCL today. She feels better after IVF. I will arrange for her to receive additional fluids later this week -I encouraged her to eat and drink more  -f/u next week for close monitoring  2. 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 Ardis Hughs on 03/29/19  -her  juandice resolved, bilinormalized  -LFTs trending up lately. AST 126, ALT 90, Alk phos 195. Bili WNL. Will evaluate on CT   3. Chemotherapy induced nausea and vomiting (CINV), diarrhea -she had mild nausea after FOLFIRINOXcycle 1, which increased and she developed vomiting on day 6; 1 episode daily for 3 days -Emend was added with cycle 2, improved  -previously prescribedvery low doseativan 0.25 mg q8 PRN for refractory nausea and anxiety; I reviewed side effect profile, especially drowsiness, and recommend she take it at night. She understands.she knows not to drive after taking this.  -nausea improved slightly with zofran in the morning and ativan at night, she still does have nausea with vomiting every few days -she has loose BM 1-4 per day. She takes lomotil and imodium BID; she can increase. I encouraged her to take before meals, she agrees  -recent Cdif testing is negative on 05/09/19  -symptoms are refractory to medication, requiring frequent IVF support. Unclear if this is prolonged side effects from chemo vs other process. Will obtain CT AP w contrast this week to evaluate.  -will add creon digestive enzyme to see if this helps her symptoms and nutrition status   4. Lower leg edema  -Doppler5/22 negative for DVT,previously reviewed - elevate legs during rest, and wear compression stockings if needed   5.Acid-related esophagitis with bleeding -found on EGD 03/29/19 per Dr. Ardis Hughs -continuepantoprazole 20 mg BID  6. Social support -She has used our transportation service with some issues on both ends -her neighbor drives her occasionally, pt feels like she is becoming a burden on her  -followed by [place]  -Her chemo has been changed to less intensive regimen, which will likely make transportation easier -she has not started new chemo yet, should be able to drive herself to appointments   7. Genetics  -Due to her personal history of pancreas cancer, she  qualifies for genetic testing.  -She cancelled appt on 05/05/19, she was not feeling well -has not been rescheduled yet   PLAN  -Labs reviewed -Hold chemo -1 L NS + 20 KCL over 2 hours today -IVF later this week  -f/u in 1 week  -CT AP this week or next  -Discuss CT  in tumor board  -Wean citalopram - take 1/2 tab for 3 days, then take 1/2 tab every other day for 3-4 days -Begin mirtazapine in 3 days, 1 tab daily  -Begin Creon - take 1 capsule with snacks and 2 capsules with meals daily    Orders Placed This Encounter  Procedures  . CT Abdomen Pelvis W Contrast    Pancreatic protocol    Standing Status:   Future    Standing Expiration Date:   05/15/2020    Order Specific Question:   ** REASON FOR EXAM (FREE TEXT)    Answer:   persistent n/v/d after chemo s/p 2 cycles neoadjuvant chemo    Order Specific Question:   If indicated for the ordered procedure, I authorize the administration of contrast media per Radiology protocol    Answer:   Yes    Order Specific Question:   Preferred imaging location?    Answer:   [facility]    Order Specific Question:   Is Oral Contrast requested for this exam?    Answer:   Yes, Per Radiology protocol    Order Specific Question:   Radiology Contrast Protocol - do NOT remove file path    Answer:   [email or web address]   All questions were answered. The patient knows to call the clinic with any problems, questions or concerns. No barriers to learning was detected.     Alla Feeling, NP 05/16/19   Addendum   I have seen the patient, examined her. I agree with the assessment and and plan and have edited the notes.   Ms Quang has persistent GI symptoms since her last chemo about 19 days ago. Will continue supportive care. She is not ready to start gem/abraxane. I will get a restaging CT to rule out cancer progression, and give her more time to recover. Will discuss her case in tumor board next week, to see if we  should move surgery earlier.   Truitt Merle  05/17/2019

## 2019-05-16 NOTE — Telephone Encounter (Signed)
Contacted patient verify telephone visit for pre reg

## 2019-05-16 NOTE — Patient Instructions (Signed)
1. Taper off citalopram (celexa): take 1/2 tab daily for the next 3 days, then take 1/2 tab every other day for 3-4 days. Begin mirtazapine (1 tab daily at bedtime) in 3 days. Some days you will overlap medication, that's OK. By next week, you should be completely off celexa. The new medication is to improve appetite and depression symptoms. It is also to help sleep, it may cause drowsiness.   2. You are beginning Creon (pancreatic enzymes) to help you digest food. Take 1 capsule before snacks and 2 capsules before meals every day.   3. The scheduler will call you with an appointment for IV fluids later this week. You will also get a call in the next few days with instructions for upcoming CT scan  4. Continue taking nausea and diarrhea medication as needed at home. Call the office if symptoms worsen or do not improve.

## 2019-05-17 ENCOUNTER — Encounter: Payer: Self-pay | Admitting: General Practice

## 2019-05-17 ENCOUNTER — Inpatient Hospital Stay: Payer: Medicare HMO

## 2019-05-17 NOTE — Progress Notes (Signed)
[facility] CSW Progress Notes  Called patient to check in w her and provide additional support.  Feels a little better this morning, although has had mild diarrhea.  Continues to find it difficult to eat/drink "I just dont feel like eating and when I do it just comes up again."  Aware that MD has prescribed medication to help - was unable to get yesterday because it was not available at pharmacy at [facility].  Plans to go today to pick up.  CSW offered information on pharmacies that deliver - patient is not interested in this at present, "I have neighbors who can help me if I need it."  Confused about upcoming appointments, was called by transport this AM for appt today - has no in person appts.  Schedule seems to be "changing all the time and I dont know when Im supposed to be there."  CSW will ask desk RN to call patient - pt aware that scan is in process of being scheduled, which may be impacting her appt schedule.  Encouraged to get outside and find something other than NVD to focus on, encouraged patient to trust that providers are working on ways to allow her to tolerate treatment more easily.  Edwyna Shell, LCSW Clinical Social Worker Phone:  [phone]

## 2019-05-17 NOTE — Progress Notes (Signed)
Nutrition Follow-up:  RD working remotely.  Patient with pancreatic cancer with duodenal involvement.  Patient receiving neoadjuvant chemotherapy.  Noted chemo held yesterday (gemcitbine and abraxane.    Spoke with patient via phone.  Patient reports that she has no appetite and has been feeling bad.  MD aware.  Reports that she is getting a medicine to help stimulate her appetite later today and another medicine for anxiety.  Reports that she drank carnation breakfast essential drink this am and yesterday am. Julie Rollins was able to tolerate grilled chicken and potatoes for lunch and ate few pieces of cantelope for dinner.  "I just don't know what to eat when you don't have an appetite."  "I live by myself and don't have anybody to cook for me."  "I don't feel like cooking."    Medications: remeron, creon  Labs: reviewed  Anthropometrics:   Weight 123 lb 8 oz noted on 6/22 stable from 6/12.       NUTRITION DIAGNOSIS:  Inadequate oral intake continues   INTERVENTION:  Encouraged drinking carnation breakfast essentials more than 1 time per day if tolerated.  Patient and RD discussed options she can try for lunch today.   Tried to focus patient's attention to what she can do and what is working well for her.   Contact information given    MONITORING, EVALUATION, GOAL: Patient will consume adequate calories and protein to maintain weight during treatment   NEXT VISIT: phone f/u Tuesday, July 7  Julie Rollins, [place], [place] Registered Dietitian [phone] (pager)

## 2019-05-18 ENCOUNTER — Encounter: Payer: Self-pay | Admitting: *Deleted

## 2019-05-19 ENCOUNTER — Other Ambulatory Visit: Payer: Self-pay

## 2019-05-19 ENCOUNTER — Inpatient Hospital Stay: Payer: Medicare HMO

## 2019-05-19 ENCOUNTER — Encounter: Payer: Self-pay | Admitting: Nurse Practitioner

## 2019-05-19 VITALS — BP 126/66 | HR 91 | Temp 98.5°F | Resp 16

## 2019-05-19 DIAGNOSIS — C250 Malignant neoplasm of head of pancreas: Secondary | ICD-10-CM

## 2019-05-19 DIAGNOSIS — Z95828 Presence of other vascular implants and grafts: Secondary | ICD-10-CM

## 2019-05-19 DIAGNOSIS — Z5111 Encounter for antineoplastic chemotherapy: Secondary | ICD-10-CM | POA: Diagnosis not present

## 2019-05-19 LAB — CBC WITH DIFFERENTIAL (CANCER CENTER ONLY)
Abs Immature Granulocytes: 0.03 10*3/uL (ref 0.00–0.07)
Basophils Absolute: 0.1 10*3/uL (ref 0.0–0.1)
Basophils Relative: 1 %
Eosinophils Absolute: 0.3 10*3/uL (ref 0.0–0.5)
Eosinophils Relative: 5 %
HCT: 39.1 % (ref 36.0–46.0)
Hemoglobin: 12.6 g/dL (ref 12.0–15.0)
Immature Granulocytes: 1 %
Lymphocytes Relative: 25 %
Lymphs Abs: 1.5 10*3/uL (ref 0.7–4.0)
MCH: 30.7 pg (ref 26.0–34.0)
MCHC: 32.2 g/dL (ref 30.0–36.0)
MCV: 95.4 fL (ref 80.0–100.0)
Monocytes Absolute: 0.8 10*3/uL (ref 0.1–1.0)
Monocytes Relative: 14 %
Neutro Abs: 3.2 10*3/uL (ref 1.7–7.7)
Neutrophils Relative %: 54 %
Platelet Count: 350 10*3/uL (ref 150–400)
RBC: 4.1 MIL/uL (ref 3.87–5.11)
RDW: 15.9 % — ABNORMAL HIGH (ref 11.5–15.5)
WBC Count: 5.9 10*3/uL (ref 4.0–10.5)
nRBC: 0 % (ref 0.0–0.2)

## 2019-05-19 LAB — CMP (CANCER CENTER ONLY)
ALT: 219 U/L — ABNORMAL HIGH (ref 0–44)
AST: 234 U/L (ref 15–41)
Albumin: 3.1 g/dL — ABNORMAL LOW (ref 3.5–5.0)
Alkaline Phosphatase: 219 U/L — ABNORMAL HIGH (ref 38–126)
Anion gap: 9 (ref 5–15)
BUN: 11 mg/dL (ref 8–23)
CO2: 28 mmol/L (ref 22–32)
Calcium: 8.7 mg/dL — ABNORMAL LOW (ref 8.9–10.3)
Chloride: 103 mmol/L (ref 98–111)
Creatinine: 0.7 mg/dL (ref 0.44–1.00)
GFR, Est AFR Am: >60 mL/min (ref >60)
GFR, Estimated: >60 mL/min (ref >60)
Glucose, Bld: 115 mg/dL — ABNORMAL HIGH (ref 70–99)
Potassium: 3.5 mmol/L (ref 3.5–5.1)
Sodium: 140 mmol/L (ref 135–145)
Total Bilirubin: 0.5 mg/dL (ref 0.3–1.2)
Total Protein: 5.9 g/dL — ABNORMAL LOW (ref 6.5–8.1)

## 2019-05-19 MED ORDER — PROCHLORPERAZINE MALEATE 10 MG PO TABS
ORAL_TABLET | ORAL | Status: AC
  Filled 2019-05-19: qty 1

## 2019-05-19 MED ORDER — SODIUM CHLORIDE 0.9% FLUSH
10.0000 mL | INTRAVENOUS | Status: DC | PRN
  Administered 2019-05-19: 15:00:00 10 mL
  Filled 2019-05-19: qty 10

## 2019-05-19 MED ORDER — PROCHLORPERAZINE MALEATE 10 MG PO TABS
10.0000 mg | ORAL_TABLET | Freq: Once | ORAL | Status: AC
  Administered 2019-05-19: 16:00:00 10 mg via ORAL

## 2019-05-19 MED ORDER — HEPARIN SOD (PORK) LOCK FLUSH 100 UNIT/ML IV SOLN
500.0000 [IU] | Freq: Once | INTRAVENOUS | Status: AC | PRN
  Administered 2019-05-19: 17:00:00 500 [IU]
  Filled 2019-05-19: qty 5

## 2019-05-19 MED ORDER — SODIUM CHLORIDE 0.9 % IV SOLN
Freq: Once | INTRAVENOUS | Status: AC
  Administered 2019-05-19: 15:00:00 via INTRAVENOUS
  Filled 2019-05-19: qty 250

## 2019-05-19 MED ORDER — SODIUM CHLORIDE 0.9% FLUSH
10.0000 mL | Freq: Once | INTRAVENOUS | Status: AC | PRN
  Administered 2019-05-19: 10 mL
  Filled 2019-05-19: qty 10

## 2019-05-19 NOTE — Patient Instructions (Signed)
CT SCAN INSTRUCTIONS  Drink 1st bottle of contrast at 5:15am Drink 2nd bottle of contrast at 6:15am CT is scheduled @ 7:15am   DO NOT take any Tylenol due to increase in liver enzymes unless Dr. [name] or Cira Rue, NP instruct you to do so.      Dehydration, Adult  Dehydration is when there is not enough fluid or water in your body. This happens when you lose more fluids than you take in. Dehydration can range from mild to very bad. It should be treated right away to keep it from getting very bad. Symptoms of mild dehydration may include:  Thirst.  Dry lips.  Slightly dry mouth.  Dry, warm skin.  Dizziness. Symptoms of moderate dehydration may include:  Very dry mouth.  Muscle cramps.  Dark pee (urine). Pee may be the color of tea.  Your body making less pee.  Your eyes making fewer tears.  Heartbeat that is uneven or faster than normal (palpitations).  Headache.  Light-headedness, especially when you stand up from sitting.  Fainting (syncope). Symptoms of very bad dehydration may include:  Changes in skin, such as: ? Cold and clammy skin. ? Blotchy (mottled) or pale skin. ? Skin that does not quickly return to normal after being lightly pinched and let go (poor skin turgor).  Changes in body fluids, such as: ? Feeling very thirsty. ? Your eyes making fewer tears. ? Not sweating when body temperature is high, such as in hot weather. ? Your body making very little pee.  Changes in vital signs, such as: ? Weak pulse. ? Pulse that is more than 100 beats a minute when you are sitting still. ? Fast breathing. ? Low blood pressure.  Other changes, such as: ? Sunken eyes. ? Cold hands and feet. ? Confusion. ? Lack of energy (lethargy). ? Trouble waking up from sleep. ? Short-term weight loss. ? Unconsciousness. Follow these instructions at home:   If told by your doctor, drink an ORS: ? Make an ORS by using instructions on the package. ? Start  by drinking small amounts, about  cup (120 mL) every 5-10 minutes. ? Slowly drink more until you have had the amount that your doctor said to have.  Drink enough clear fluid to keep your pee clear or pale yellow. If you were told to drink an ORS, finish the ORS first, then start slowly drinking clear fluids. Drink fluids such as: ? Water. Do not drink only water by itself. Doing that can make the salt (sodium) level in your body get too low (hyponatremia). ? Ice chips. ? Fruit juice that you have added water to (diluted). ? Low-calorie sports drinks.  Avoid: ? Alcohol. ? Drinks that have a lot of sugar. These include high-calorie sports drinks, fruit juice that does not have water added, and soda. ? Caffeine. ? Foods that are greasy or have a lot of fat or sugar.  Take over-the-counter and prescription medicines only as told by your doctor.  Do not take salt tablets. Doing that can make the salt level in your body get too high (hypernatremia).  Eat foods that have minerals (electrolytes). Examples include bananas, oranges, potatoes, tomatoes, and spinach.  Keep all follow-up visits as told by your doctor. This is important. Contact a doctor if:  You have belly (abdominal) pain that: ? Gets worse. ? Stays in one area (localizes).  You have a rash.  You have a stiff neck.  You get angry or annoyed more easily than  normal (irritability).  You are more sleepy than normal.  You have a harder time waking up than normal.  You feel: ? Weak. ? Dizzy. ? Very thirsty.  You have peed (urinated) only a small amount of very dark pee during 6-8 hours. Get help right away if:  You have symptoms of very bad dehydration.  You cannot drink fluids without throwing up (vomiting).  Your symptoms get worse with treatment.  You have a fever.  You have a very bad headache.  You are throwing up or having watery poop (diarrhea) and it: ? Gets worse. ? Does not go away.  You have  blood or something green (bile) in your throw-up.  You have blood in your poop (stool). This may cause poop to look black and tarry.  You have not peed in 6-8 hours.  You pass out (faint).  Your heart rate when you are sitting still is more than 100 beats a minute.  You have trouble breathing. This information is not intended to replace advice given to you by your health care provider. Make sure you discuss any questions you have with your health care provider. Document Released: 09/06/2009 Document Revised: 05/30/2016 Document Reviewed: 01/04/2016 Elsevier Interactive Patient Education  2019 [employer].

## 2019-05-19 NOTE — Progress Notes (Signed)
Pt with c/o minimal amount of nausea.  Verbal orders obtained from Silvio Clayman, NP for Compazine 10mg  PO X 1.

## 2019-05-19 NOTE — Progress Notes (Signed)
Patient's LFTs markedly elevated. I saw her in infusion area, receiving IVF and po compazine. She feels pretty well. Has started creon. Loose BM improving, just 1 episode yesterday and today. Denies vomiting, fever, chills, or increased abd pain. CT scan moved to 05/20/19 arrival time 7:15 am. Patient has contrast, aware of drinking times. RN to set up transportation service. Patient aware of the plan.  Cira Rue, NP  05/19/19

## 2019-05-19 NOTE — Progress Notes (Signed)
[facility]   Telephone:(336) [phone] [phone]   Clinic Follow up Note   Patient Care Team: Nolene Ebbs, MD as PCP - General (Internal Medicine) Stark Klein, MD as Consulting Physician (General Surgery) Truitt Merle, MD as Consulting Physician (Hematology) Alla Feeling, NP as Nurse Practitioner (Nurse Practitioner) Arna Snipe, RN as Oncology Nurse Navigator  Date of Service:  05/23/2019  CHIEF COMPLAINT: F/u of pancreatic cancer  SUMMARY OF ONCOLOGIC HISTORY: Oncology History  Malignant neoplasm of head of pancreas ([place])  03/28/2019 Imaging   CT AP w contrast IMPRESSION: Interval development of 20 x 14 mm ill-defined low density in pancreatic head concerning for possible neoplasm or malignancy. This appears to be resulting in severe intrahepatic and extrahepatic biliary dilatation. ERCP is recommended for further evaluation.  Sigmoid diverticulosis without inflammation.   03/29/2019 Initial Biopsy   Diagnosis FINE NEEDLE ASPIRATION, ENDOSCOPIC EGUS, PANCREATIC HEAD MASS (SPECIMEN 1 OF 2 COLLECTED 03/29/2019) MALIGNANT CELLS CONSISTENT WITH ADENOCARCINOMA.   03/29/2019 Initial Biopsy   Diagnosis Duodenum, Biopsy, abnormal mucosa at major papilla - ADENOCARCINOMA. SEE NOTE Diagnosis Note The duodenal mucosa is negative for dysplasia. The findings are consistent with duodenal involvement from patient's suspected primary pancreatic adenocarcinoma. Dr. Melina Copa has reviewed this case and concurs with the above interpretation. Dr. Ardis Hughs was notified on 03/30/2019.   03/29/2019 Pathology Results   Diagnosis BILE DUCT BRUSHING (SPECIMEN 2 OF 2, COLLECTED ON 03/29/2019): HIGHLY SUSPICIOUS FOR MALIGNANCY.   03/29/2019 Procedure   ERCP with stenting of malignant distal CBD stricture per Dr. Ardis Hughs   04/04/2019 Initial Diagnosis   Malignant neoplasm of head of pancreas ([place])   04/08/2019 Imaging   CT Chest IMPRESSION: No evidence of metastatic disease in the  chest. Aortic Atherosclerosis (ICD10-I70.0).    04/13/2019 - 05/10/2019 Chemotherapy   neoadjuvant FOLFIRINOX q3weeks beginning 04/13/19; dose-reduced with cycle 1      CURRENT THERAPY:  neoadjuvant dose modified FOLFIRINOX q3weeks beginning5/20/20; stopped after 2 cycles due to poor tolerance   INTERVAL HISTORY:  Julie Rollins is here for a follow up and treatment. She presents to the clinic alone and came in wheelchair. She was last seen by NP Lacie for supportive care and symptom management.  She continues to do poorly.  She has persistent nausea, occasional vomiting, and very low appetite.  She eats a few bites each meal, able to keep some nutritional supplement such as Carnation breakfast 1 bottle a day. She still has loose bowel movement, 4-5 times a day, she is using Imodium, but not Lomotil.  She denies any fever, abdominal cramps, or chest discomfort. She is very weak, lives alone, able to mover around at home, but not doing much activities.    REVIEW OF SYSTEMS:   Constitutional: Denies fevers, chills, lost about 3 lbs since last week  Eyes: Denies blurriness of vision Ears, nose, mouth, throat, and face: Denies mucositis or sore throat Respiratory: Denies cough, dyspnea or wheezes Cardiovascular: Denies palpitation, chest discomfort or lower extremity swelling Gastrointestinal:  (+)nausea, vomiting and loose BM  Skin: Denies abnormal skin rashes Lymphatics: Denies new lymphadenopathy or easy bruising Neurological:Denies numbness, tingling or new weaknesses Behavioral/Psych: Mood is stable, no new changes  All other systems were reviewed with the patient and are negative.  MEDICAL HISTORY:  Past Medical History:  Diagnosis Date   Anxiety    Blood transfusion without reported diagnosis    Breast CA ([place])    s/p lumpectomy and radiation 2000   Breast cancer ([place])  Colitis    2007   Hernia, epigastric    HTN (hypertension)    "mild hypertension", was on BP  med years ago but it caused orthostatic hypotension and she has not been medicated since   Hypothyroid    Pancreatic cancer ([place]) dx'd 03/2019   Personal history of radiation therapy 2001   Vertigo    Vertigo     SURGICAL HISTORY: Past Surgical History:  Procedure Laterality Date   ABDOMINAL HYSTERECTOMY     83yrs ago   ANTERIOR CERVICAL DECOMP/DISCECTOMY FUSION N/A 08/02/2018   Procedure: ANTERIOR CERVICAL DECOMPRESSION/DISCECTOMY FUSION CERVICAL FIVE- CERVICAL SIX;  Surgeon: Earnie Larsson, MD;  Location: [facility];  Service: Neurosurgery;  Laterality: N/A;  ANTERIOR CERVICAL DECOMPRESSION/DISCECTOMY FUSION CERVICAL FIVE- CERVICAL SIX   BILIARY BRUSHING  03/29/2019   Procedure: BILIARY BRUSHING;  Surgeon: [name], MD;  Location: [facility] ENDOSCOPY;  Service: Endoscopy;;   BILIARY STENT PLACEMENT  03/29/2019   Procedure: BILIARY STENT PLACEMENT;  Surgeon: [name], MD;  Location[facility] ENDOSCOPY;  Service: Endoscopy;;   BIOPSY  03/29/2019   Procedure: BIOPSY;  Surgeon: [name], MD;  Location: [facility] ENDOSCOPY;  Service: Endoscopy;;   BREAST LUMPECTOMY     2000   ERCP N/A 03/29/2019   Procedure: ENDOSCOPIC RETROGRADE CHOLANGIOPANCREATOGRAPHY (ERCP);  Surgeon: [name], MD;  Location: [facility] ENDOSCOPY;  Service: Endoscopy;  Laterality: N/A;   ESOPHAGOGASTRODUODENOSCOPY (EGD) WITH PROPOFOL N/A 03/29/2019   Procedure: ESOPHAGOGASTRODUODENOSCOPY (EGD) WITH PROPOFOL;  Surgeon: [name], MD;  Location: [name] ENDOSCOPY;  Service: Endoscopy;  Laterality: N/A;   EUS N/A 03/29/2019   Procedure: UPPER ENDOSCOPIC ULTRASOUND (EUS) LINEAR;  Surgeon: [name], MD;  Location: [facility] ENDOSCOPY;  Service: Endoscopy;  Laterality: N/A;   FINE NEEDLE ASPIRATION  03/29/2019   Procedure: FINE NEEDLE ASPIRATION (FNA);  Surgeon: [name], MD;  Location: [facility] ENDOSCOPY;  Service: Endoscopy;;   HERNIA REPAIR     20+ years ago   PORTACATH PLACEMENT N/A 04/12/2019   Procedure: INSERTION  PORT-A-CATH LEFT SUBCLAVIAN;  Surgeon: Stark Klein, MD;  Location: [facility];  Service: General;  Laterality: N/A;   RETINAL DETACHMENT SURGERY     SPHINCTEROTOMY  03/29/2019   Procedure: SPHINCTEROTOMY;  Surgeon: [name], MD;  Location: [facility] ENDOSCOPY;  Service: Endoscopy;;    I have reviewed the social history and family history with the patient and they are unchanged from previous note.  ALLERGIES:  is allergic to codeine.  MEDICATIONS:  Current Outpatient Medications  Medication Sig Dispense Refill   acetaminophen (TYLENOL) 325 MG tablet Take 2 tablets (650 mg total) by mouth every 8 (eight) hours.     acetaminophen (TYLENOL) 500 MG tablet Take 1,000 mg by mouth every 6 (six) hours as needed.     diltiazem (CARDIZEM CD) 180 MG 24 hr capsule Take 180 mg by mouth daily.     diphenoxylate-atropine (LOMOTIL) 2.5-0.025 MG tablet Take 2 tablets by mouth 4 (four) times daily as needed for diarrhea or loose stools. 60 tablet 1   HYDROcodone-acetaminophen (NORCO/VICODIN) 5-325 MG tablet Take 0.5-1 tablets by mouth every 6 (six) hours as needed for moderate pain or severe pain. 8 tablet 0   levothyroxine (SYNTHROID, LEVOTHROID) 50 MCG tablet Take 50 mcg by mouth every morning.     lipase/protease/amylase (CREON) 12000 units CPEP capsule Take 1 capsule with snack and 2 capsules with meals daily 270 capsule 1   LORazepam (ATIVAN) 0.5 MG tablet Take 0.5 tablets (0.25 mg  total) by mouth every 8 (eight) hours as needed for anxiety. 30 tablet 0   mirtazapine (REMERON) 7.5 MG tablet Take 1 tablet (7.5 mg total) by mouth at bedtime. 30 tablet 1   pantoprazole (PROTONIX) 20 MG tablet Take 1 tablet (20 mg total) by mouth 2 (two) times daily before a meal. 60 tablet 2   potassium chloride 20 MEQ/15ML (10%) SOLN Take 15 mLs (20 mEq total) by mouth daily. 473 mL 0   OLANZapine (ZYPREXA) 5 MG tablet Take 1 tablet (5 mg total) by mouth daily. 30 tablet 0   ondansetron  (ZOFRAN) 4 MG tablet Take 1 tablet (4 mg total) by mouth every 8 (eight) hours as needed for nausea or vomiting. 20 tablet 0   No current facility-administered medications for this visit.     PHYSICAL EXAMINATION: ECOG PERFORMANCE STATUS: 3 - Symptomatic, >50% confined to bed  Vitals:   05/23/19 0813  BP: 126/74  Pulse: 99  Resp: 18  Temp: 98.2 F (36.8 C)  SpO2: 98%   Filed Weights   05/23/19 0813  Weight: 120 lb 11.2 oz (54.7 kg)   GENERAL:alert, no distress, chronic ill appearing  SKIN: skin color, texture, turgor are normal, no rashes or significant lesions LUNGS: clear to auscultation and percussion with normal breathing effort HEART: regular rate & rhythm and no murmurs and no lower extremity edema ABDOMEN:abdomen soft, non-tender and normal bowel sounds Musculoskeletal:no cyanosis of digits and no clubbing  NEURO: alert & oriented x 3 with fluent speech, no focal motor/sensory deficits  LABORATORY DATA:  I have reviewed the data as listed CBC Latest Ref Rng & Units 05/23/2019 05/19/2019 05/16/2019  WBC 4.0 - 10.5 K/uL 3.7(L) 5.9 10.6(H)  Hemoglobin 12.0 - 15.0 g/dL 13.0 12.6 13.1  Hematocrit 36.0 - 46.0 % 39.7 39.1 39.2  Platelets 150 - 400 K/uL 267 350 322     CMP Latest Ref Rng & Units 05/23/2019 05/19/2019 05/16/2019  Glucose 70 - 99 mg/dL 183(H) 115(H) 172(H)  BUN 8 - 23 mg/dL 8 11 8   Creatinine 0.44 - 1.00 mg/dL 0.74 0.70 0.77  Sodium 135 - 145 mmol/L 140 140 139  Potassium 3.5 - 5.1 mmol/L 3.7 3.5 3.1(L)  Chloride 98 - 111 mmol/L 105 103 102  CO2 22 - 32 mmol/L 26 28 26   Calcium 8.9 - 10.3 mg/dL 8.8(L) 8.7(L) 8.6(L)  Total Protein 6.5 - 8.1 g/dL 6.0(L) 5.9(L) 5.8(L)  Total Bilirubin 0.3 - 1.2 mg/dL 0.5 0.5 0.6  Alkaline Phos 38 - 126 U/L 232(H) 219(H) 195(H)  AST 15 - 41 U/L 317(HH) 234(HH) 126(H)  ALT 0 - 44 U/L 295(HH) 219(H) 90(H)      RADIOGRAPHIC STUDIES: I have personally reviewed the radiological images as listed and agreed with the findings in  the report. No results found.   ASSESSMENT & PLAN:  Julie Rollins is a 73 y.o. female with   1. Adenocarcinoma of pancreatic head, cT2N0M0, with duodenal involvement   -Diagnosed 03/2019, presented with painless juandice s/p ERCP with stenting (bili 23.9).  -She started neoadjuvant FOLFIRINOX on 04/13/19. She tolerating first cycle moderately well wit N&V and diarrhea. she did poorly with second cycle (slight dose increase), and has not recovered since the treatment on 6/3.  -due to her poor tolerance, will stop chemo. I do not think she can tolerate gemcitabine and Abraxane either. -I reviewed her restaging CT abdomen and pelvis with contrast from May 20, 2019, which showed slightly increased of the primary pancreatic tumor (from  1.7cm to 1.9cm), no node or liver metastasis. -We will discuss her case in GI tumor board this week.  I will refer her back to see her surgeon Dr. Barry Dienes, to see if she can proceed with Whipple surgery when she recovers from chemo, or do neoadjuvant radiation before surgery.  -continue supportive care (see below)   2. 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 Ardis Hughs on 03/29/19  -Her jaundice has resolved  3.  Persistent nausea, vomiting, diarrhea, and anorexia -She had these symptoms before chemo, got much worse after chemo -She is taking Compazine and lorazepam as needed for nausea, I refilled all Benadryl, and prescribe olanzapine 5 mg daily for nausea and anorexia -I refilled her bottle, and encouraged her to use Imodium and Lomotil alternatively for her diarrhea -She is on mirtazapine, will continue, I encouraged her to drink more nutritional supplement, she will follow-up with dietitian -Continue IV fluids, IV antiemetics today, and later this week -f/u weekly until improves   4. Esophagitis with bleeding- acid related  -found on EGD 03/29/19 per Dr.  Ardis Hughs -continue pantoprazole 20 mg BID  5. Genetic Testing  -due to her personal history of breast cancer (DCIS) and pancreas cancer, she is a candidate for genetic testing. She was referred by Dr. Barry Dienes.  -she does not have children. She does have a brother who had bladder cancer -Had webex appointment on 6/11 but did not happen, will check with genetics again   6. H/o left breast DCIS -diagnosed in 2000, s/p lumpectomy and adjuvant radiation, managed by Dr. Margot Chimes -last mammo in 01/2018 negative for evidence of malignancy   7. Hypothyroidism -on Synthroid per PCP   8.  Transaminitis -Her initial transaminitis and jaundice most resolved after stent placement -Persistent moderate transaminitis after chemo, total bilirubin normal, will continue monitoring -No liver metastasis on recent scan.  9. Social issues -She lives alone, has very limited social support -Our cancer center has been offering transportation to support her  PLAN: -CT scan reviewed, will discussed in TB -continue holding off chemo  -refilled Zofran and Lomotil, I called in olanzapine -IVF and antiemtics today, and again in 3 days -f/u with Lacie next week with IVF    No problem-specific Assessment & Plan notes found for this encounter.   No orders of the defined types were placed in this encounter.  All questions were answered. The patient knows to call the clinic with any problems, questions or concerns. No barriers to learning was detected. I spent 20 minutes counseling the patient face to face. The total time spent in the appointment was 25 minutes and more than 50% was on counseling and review of test results     Truitt Merle, MD 05/23/2019   I, Joslyn Devon, am acting as scribe for Truitt Merle, MD.   I have reviewed the above documentation for accuracy and completeness, and I agree with the above.

## 2019-05-19 NOTE — H&P (View-Only) (Signed)
[place]   [phone] [phone]   Clinic Follow up Note   Patient Care Team: Nolene Ebbs, MD as PCP - General (Internal Medicine) Stark Klein, MD as Consulting Physician (General Surgery) Truitt Merle, MD as Consulting Physician (Hematology) Alla Feeling, NP as Nurse Practitioner (Nurse Practitioner) Arna Snipe, RN as Oncology Nurse Navigator  Date of Service:  05/23/2019  CHIEF COMPLAINT: F/u of pancreatic cancer  SUMMARY OF ONCOLOGIC HISTORY: Oncology History  Malignant neoplasm of head of pancreas ([place])  03/28/2019 Imaging   CT AP w contrast IMPRESSION: Interval development of 20 x 14 mm ill-defined low density in pancreatic head concerning for possible neoplasm or malignancy. This appears to be resulting in severe intrahepatic and extrahepatic biliary dilatation. ERCP is recommended for further evaluation.  Sigmoid diverticulosis without inflammation.   03/29/2019 Initial Biopsy   Diagnosis FINE NEEDLE ASPIRATION, ENDOSCOPIC EGUS, PANCREATIC HEAD MASS (SPECIMEN 1 OF 2 COLLECTED 03/29/2019) MALIGNANT CELLS CONSISTENT WITH ADENOCARCINOMA.   03/29/2019 Initial Biopsy   Diagnosis Duodenum, Biopsy, abnormal mucosa at major papilla - ADENOCARCINOMA. SEE NOTE Diagnosis Note The duodenal mucosa is negative for dysplasia. The findings are consistent with duodenal involvement from patient's suspected primary pancreatic adenocarcinoma. Dr. Melina Copa has reviewed this case and concurs with the above interpretation. Dr. Ardis Hughs was notified on 03/30/2019.   03/29/2019 Pathology Results   Diagnosis BILE DUCT BRUSHING (SPECIMEN 2 OF 2, COLLECTED ON 03/29/2019): HIGHLY SUSPICIOUS FOR MALIGNANCY.   03/29/2019 Procedure   ERCP with stenting of malignant distal CBD stricture per Dr. Ardis Hughs   04/04/2019 Initial Diagnosis   Malignant neoplasm of head of pancreas ([place])   04/08/2019 Imaging   CT Chest IMPRESSION: No evidence of metastatic disease in the  chest. Aortic Atherosclerosis (ICD10-I70.0).    04/13/2019 - 05/10/2019 Chemotherapy   neoadjuvant FOLFIRINOX q3weeks beginning 04/13/19; dose-reduced with cycle 1      CURRENT THERAPY:  neoadjuvant dose modified FOLFIRINOX q3weeks beginning5/20/20; stopped after 2 cycles due to poor tolerance   INTERVAL HISTORY:  Julie Rollins is here for a follow up and treatment. She presents to the clinic alone and came in wheelchair. She was last seen by NP Lacie for supportive care and symptom management.  She continues to do poorly.  She has persistent nausea, occasional vomiting, and very low appetite.  She eats a few bites each meal, able to keep some nutritional supplement such as Carnation breakfast 1 bottle a day. She still has loose bowel movement, 4-5 times a day, she is using Imodium, but not Lomotil.  She denies any fever, abdominal cramps, or chest discomfort. She is very weak, lives alone, able to mover around at home, but not doing much activities.    REVIEW OF SYSTEMS:   Constitutional: Denies fevers, chills, lost about 3 lbs since last week  Eyes: Denies blurriness of vision Ears, nose, mouth, throat, and face: Denies mucositis or sore throat Respiratory: Denies cough, dyspnea or wheezes Cardiovascular: Denies palpitation, chest discomfort or lower extremity swelling Gastrointestinal:  (+)nausea, vomiting and loose BM  Skin: Denies abnormal skin rashes Lymphatics: Denies new lymphadenopathy or easy bruising Neurological:Denies numbness, tingling or new weaknesses Behavioral/Psych: Mood is stable, no new changes  All other systems were reviewed with the patient and are negative.  MEDICAL HISTORY:  Past Medical History:  Diagnosis Date   Anxiety    Blood transfusion without reported diagnosis    Breast CA ([place])    s/p lumpectomy and radiation 2000   Breast cancer ([place])  Colitis    2007   Hernia, epigastric    HTN (hypertension)    "mild hypertension", was on BP  med years ago but it caused orthostatic hypotension and she has not been medicated since   Hypothyroid    Pancreatic cancer ([place]) dx'd 03/2019   Personal history of radiation therapy 2001   Vertigo    Vertigo     SURGICAL HISTORY: Past Surgical History:  Procedure Laterality Date   ABDOMINAL HYSTERECTOMY     65yrs ago   ANTERIOR CERVICAL DECOMP/DISCECTOMY FUSION N/A 08/02/2018   Procedure: ANTERIOR CERVICAL DECOMPRESSION/DISCECTOMY FUSION CERVICAL FIVE- CERVICAL SIX;  Surgeon: Earnie Larsson, MD;  Location: [place];  Service: Neurosurgery;  Laterality: N/A;  ANTERIOR CERVICAL DECOMPRESSION/DISCECTOMY FUSION CERVICAL FIVE- CERVICAL SIX   BILIARY BRUSHING  03/29/2019   Procedure: BILIARY BRUSHING;  Surgeon: [name], MD;  [facility] END[facility]COPY;  Service: Endoscopy;;   BILIARY STENT PLACEMENT  03/29/2019   Procedure: BILIARY STENT PLACEMENT;  Surgeon: [name], MD;  Location: [facility] ENDOSCOPY;  Service: Endoscopy;;   BIOPSY  03/29/2019   Procedure: BIOPSY;  Surgeon: [name], MD;  Location: [facility] ENDOSCOPY;  Service: Endoscopy;;   BREAST LUMPECTOMY     2000   ERCP N/A 03/29/2019   Procedure: ENDOSCOPIC RETROGRADE CHOLANGIOPANCREATOGRAPHY (ERCP);  Surgeon: [name], MD;  Location[facility] ENDOSCOPY;  Service: Endoscopy;  Laterality: N/A;   ESOPHAGOGASTRODUODENOSCOPY (EGD) WITH PROPOFOL N/A 03/29/2019   Procedure: ESOPHAGOGASTRODUODENOSCOPY (EGD) WITH PROPOFOL;  Surgeon: [name], MD;  Location: [facility] ENDOSCOPY;  Service: Endoscopy;  Laterality: N/A;   EUS N/A 03/29/2019   Procedure: UPPER ENDOSCOPIC ULTRASOUND (EUS) LINEAR;  Surgeon: [name], MD;  Location: [facility] ENDOSCOPY;  Service: Endoscopy;  Laterality: N/A;   FINE NEEDLE ASPIRATION  03/29/2019   Procedure: FINE NEEDLE ASPIRATION (FNA);  Surgeon: [name], MD;  Location: [facility] ENDOSCOPY;  Service: Endoscopy;;   HERNIA REPAIR     20+ years ago   PORTACATH PLACEMENT N/A 04/12/2019   Procedure: INSERTION  PORT-A-CATH LEFT SUBCLAVIAN;  Surgeon: Stark Klein, MD;  Location: [place];  Service: General;  Laterality: N/A;   RETINAL DETACHMENT SURGERY     SPHINCTEROTOMY  03/29/2019   Procedure: SPHINCTEROTOMY;  Surgeon: [name], MD;  Location: [facility] ENDOSCOPY;  Service: Endoscopy;;    I have reviewed the social history and family history with the patient and they are unchanged from previous note.  ALLERGIES:  is allergic to codeine.  MEDICATIONS:  Current Outpatient Medications  Medication Sig Dispense Refill   acetaminophen (TYLENOL) 325 MG tablet Take 2 tablets (650 mg total) by mouth every 8 (eight) hours.     acetaminophen (TYLENOL) 500 MG tablet Take 1,000 mg by mouth every 6 (six) hours as needed.     diltiazem (CARDIZEM CD) 180 MG 24 hr capsule Take 180 mg by mouth daily.     diphenoxylate-atropine (LOMOTIL) 2.5-0.025 MG tablet Take 2 tablets by mouth 4 (four) times daily as needed for diarrhea or loose stools. 60 tablet 1   HYDROcodone-acetaminophen (NORCO/VICODIN) 5-325 MG tablet Take 0.5-1 tablets by mouth every 6 (six) hours as needed for moderate pain or severe pain. 8 tablet 0   levothyroxine (SYNTHROID, LEVOTHROID) 50 MCG tablet Take 50 mcg by mouth every morning.     lipase/protease/amylase (CREON) 12000 units CPEP capsule Take 1 capsule with snack and 2 capsules with meals daily 270 capsule 1   LORazepam (ATIVAN) 0.5 MG tablet Take 0.5 tablets (0.25 mg  total) by mouth every 8 (eight) hours as needed for anxiety. 30 tablet 0   mirtazapine (REMERON) 7.5 MG tablet Take 1 tablet (7.5 mg total) by mouth at bedtime. 30 tablet 1   pantoprazole (PROTONIX) 20 MG tablet Take 1 tablet (20 mg total) by mouth 2 (two) times daily before a meal. 60 tablet 2   potassium chloride 20 MEQ/15ML (10%) SOLN Take 15 mLs (20 mEq total) by mouth daily. 473 mL 0   OLANZapine (ZYPREXA) 5 MG tablet Take 1 tablet (5 mg total) by mouth daily. 30 tablet 0   ondansetron  (ZOFRAN) 4 MG tablet Take 1 tablet (4 mg total) by mouth every 8 (eight) hours as needed for nausea or vomiting. 20 tablet 0   No current facility-administered medications for this visit.     PHYSICAL EXAMINATION: ECOG PERFORMANCE STATUS: 3 - Symptomatic, >50% confined to bed  Vitals:   05/23/19 0813  BP: 126/74  Pulse: 99  Resp: 18  Temp: 98.2 F (36.8 C)  SpO2: 98%   Filed Weights   05/23/19 0813  Weight: 120 lb 11.2 oz (54.7 kg)   GENERAL:alert, no distress, chronic ill appearing  SKIN: skin color, texture, turgor are normal, no rashes or significant lesions LUNGS: clear to auscultation and percussion with normal breathing effort HEART: regular rate & rhythm and no murmurs and no lower extremity edema ABDOMEN:abdomen soft, non-tender and normal bowel sounds Musculoskeletal:no cyanosis of digits and no clubbing  NEURO: alert & oriented x 3 with fluent speech, no focal motor/sensory deficits  LABORATORY DATA:  I have reviewed the data as listed CBC Latest Ref Rng & Units 05/23/2019 05/19/2019 05/16/2019  WBC 4.0 - 10.5 K/uL 3.7(L) 5.9 10.6(H)  Hemoglobin 12.0 - 15.0 g/dL 13.0 12.6 13.1  Hematocrit 36.0 - 46.0 % 39.7 39.1 39.2  Platelets 150 - 400 K/uL 267 350 322     CMP Latest Ref Rng & Units 05/23/2019 05/19/2019 05/16/2019  Glucose 70 - 99 mg/dL 183(H) 115(H) 172(H)  BUN 8 - 23 mg/dL 8 11 8   Creatinine 0.44 - 1.00 mg/dL 0.74 0.70 0.77  Sodium 135 - 145 mmol/L 140 140 139  Potassium 3.5 - 5.1 mmol/L 3.7 3.5 3.1(L)  Chloride 98 - 111 mmol/L 105 103 102  CO2 22 - 32 mmol/L 26 28 26   Calcium 8.9 - 10.3 mg/dL 8.8(L) 8.7(L) 8.6(L)  Total Protein 6.5 - 8.1 g/dL 6.0(L) 5.9(L) 5.8(L)  Total Bilirubin 0.3 - 1.2 mg/dL 0.5 0.5 0.6  Alkaline Phos 38 - 126 U/L 232(H) 219(H) 195(H)  AST 15 - 41 U/L 317(HH) 234(HH) 126(H)  ALT 0 - 44 U/L 295(HH) 219(H) 90(H)      RADIOGRAPHIC STUDIES: I have personally reviewed the radiological images as listed and agreed with the findings in  the report. No results found.   ASSESSMENT & PLAN:  Julie Rollins is a 73 y.o. female with   1. Adenocarcinoma of pancreatic head, cT2N0M0, with duodenal involvement   -Diagnosed 03/2019, presented with painless juandice s/p ERCP with stenting (bili 23.9).  -She started neoadjuvant FOLFIRINOX on 04/13/19. She tolerating first cycle moderately well wit N&V and diarrhea. she did poorly with second cycle (slight dose increase), and has not recovered since the treatment on 6/3.  -due to her poor tolerance, will stop chemo. I do not think she can tolerate gemcitabine and Abraxane either. -I reviewed her restaging CT abdomen and pelvis with contrast from May 20, 2019, which showed slightly increased of the primary pancreatic tumor (from  1.7cm to 1.9cm), no node or liver metastasis. -We will discuss her case in GI tumor board this week.  I will refer her back to see her surgeon Dr. Barry Dienes, to see if she can proceed with Whipple surgery when she recovers from chemo, or do neoadjuvant radiation before surgery.  -continue supportive care (see below)   2. 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 Ardis Hughs on 03/29/19  -Her jaundice has resolved  3.  Persistent nausea, vomiting, diarrhea, and anorexia -She had these symptoms before chemo, got much worse after chemo -She is taking Compazine and lorazepam as needed for nausea, I refilled all Benadryl, and prescribe olanzapine 5 mg daily for nausea and anorexia -I refilled her bottle, and encouraged her to use Imodium and Lomotil alternatively for her diarrhea -She is on mirtazapine, will continue, I encouraged her to drink more nutritional supplement, she will follow-up with dietitian -Continue IV fluids, IV antiemetics today, and later this week -f/u weekly until improves   4. Esophagitis with bleeding- acid related  -found on EGD 03/29/19 per Dr.  Ardis Hughs -continue pantoprazole 20 mg BID  5. Genetic Testing  -due to her personal history of breast cancer (DCIS) and pancreas cancer, she is a candidate for genetic testing. She was referred by Dr. Barry Dienes.  -she does not have children. She does have a brother who had bladder cancer -Had webex appointment on 6/11 but did not happen, will check with genetics again   6. H/o left breast DCIS -diagnosed in 2000, s/p lumpectomy and adjuvant radiation, managed by Dr. Margot Chimes -last mammo in 01/2018 negative for evidence of malignancy   7. Hypothyroidism -on Synthroid per PCP   8.  Transaminitis -Her initial transaminitis and jaundice most resolved after stent placement -Persistent moderate transaminitis after chemo, total bilirubin normal, will continue monitoring -No liver metastasis on recent scan.  9. Social issues -She lives alone, has very limited social support -Our cancer center has been offering transportation to support her  PLAN: -CT scan reviewed, will discussed in TB -continue holding off chemo  -refilled Zofran and Lomotil, I called in olanzapine -IVF and antiemtics today, and again in 3 days -f/u with Lacie next week with IVF    No problem-specific Assessment & Plan notes found for this encounter.   No orders of the defined types were placed in this encounter.  All questions were answered. The patient knows to call the clinic with any problems, questions or concerns. No barriers to learning was detected. I spent 20 minutes counseling the patient face to face. The total time spent in the appointment was 25 minutes and more than 50% was on counseling and review of test results     Truitt Merle, MD 05/23/2019   I, Joslyn Devon, am acting as scribe for Truitt Merle, MD.   I have reviewed the above documentation for accuracy and completeness, and I agree with the above.

## 2019-05-19 NOTE — Progress Notes (Signed)
Pt unable to arrange transportation for CT scan morning of 05/20/19. Received approval for taxi voucher from [facility]. Called [employer] to set up pick-up for 6:45 am. Dema Severin copy given to patient to give to taxi driver, and yellow copy left in cubby for AD/DD. Pt will need to come to [place] after scan to obtain a voucher for ride home. Explained plan to pt who verbalized understanding and agreement.

## 2019-05-20 ENCOUNTER — Encounter (HOSPITAL_COMMUNITY): Payer: Self-pay

## 2019-05-20 ENCOUNTER — Ambulatory Visit (HOSPITAL_COMMUNITY)
Admission: RE | Admit: 2019-05-20 | Discharge: 2019-05-20 | Disposition: A | Discharge status: Home / Self Care | Payer: Medicare HMO | Source: Ambulatory Visit | Attending: Nurse Practitioner | Admitting: Nurse Practitioner

## 2019-05-20 DIAGNOSIS — D490 Neoplasm of unspecified behavior of digestive system: Secondary | ICD-10-CM | POA: Diagnosis not present

## 2019-05-20 DIAGNOSIS — C250 Malignant neoplasm of head of pancreas: Secondary | ICD-10-CM | POA: Diagnosis present

## 2019-05-20 HISTORY — DX: Malignant neoplasm of pancreas, unspecified: C25.9

## 2019-05-20 MED ORDER — SODIUM CHLORIDE (PF) 0.9 % IJ SOLN
INTRAMUSCULAR | Status: AC
  Filled 2019-05-20: qty 50

## 2019-05-20 MED ORDER — HEPARIN SOD (PORK) LOCK FLUSH 100 UNIT/ML IV SOLN
INTRAVENOUS | Status: AC
  Administered 2019-05-20: 500 [IU]
  Filled 2019-05-20: qty 5

## 2019-05-20 MED ORDER — HEPARIN SOD (PORK) LOCK FLUSH 100 UNIT/ML IV SOLN: 500.0000 [IU] | Freq: Once | INTRAVENOUS | Status: DC

## 2019-05-20 MED ORDER — IOHEXOL 300 MG/ML  SOLN
100.0000 mL | Freq: Once | INTRAMUSCULAR | Status: AC | PRN
  Administered 2019-05-20: 100 mL via INTRAVENOUS

## 2019-05-23 ENCOUNTER — Other Ambulatory Visit: Payer: Self-pay

## 2019-05-23 ENCOUNTER — Telehealth: Payer: Self-pay | Admitting: *Deleted

## 2019-05-23 ENCOUNTER — Inpatient Hospital Stay: Payer: Medicare HMO

## 2019-05-23 ENCOUNTER — Ambulatory Visit: Payer: Medicare HMO

## 2019-05-23 ENCOUNTER — Inpatient Hospital Stay (HOSPITAL_BASED_OUTPATIENT_CLINIC_OR_DEPARTMENT_OTHER): Payer: Medicare HMO | Admitting: Hematology

## 2019-05-23 ENCOUNTER — Encounter: Payer: Self-pay | Admitting: Hematology

## 2019-05-23 ENCOUNTER — Other Ambulatory Visit: Payer: Self-pay | Admitting: *Deleted

## 2019-05-23 ENCOUNTER — Ambulatory Visit (HOSPITAL_COMMUNITY): Admission: RE | Admit: 2019-05-23 | Payer: Medicare HMO | Source: Ambulatory Visit

## 2019-05-23 ENCOUNTER — Telehealth: Payer: Self-pay | Admitting: Hematology

## 2019-05-23 VITALS — BP 126/74 | HR 99 | Temp 98.2°F | Resp 18 | Ht 64.0 in | Wt 120.7 lb

## 2019-05-23 DIAGNOSIS — C250 Malignant neoplasm of head of pancreas: Secondary | ICD-10-CM | POA: Diagnosis not present

## 2019-05-23 DIAGNOSIS — C784 Secondary malignant neoplasm of small intestine: Secondary | ICD-10-CM

## 2019-05-23 DIAGNOSIS — Z95828 Presence of other vascular implants and grafts: Secondary | ICD-10-CM

## 2019-05-23 DIAGNOSIS — I10 Essential (primary) hypertension: Secondary | ICD-10-CM

## 2019-05-23 DIAGNOSIS — Z5111 Encounter for antineoplastic chemotherapy: Secondary | ICD-10-CM | POA: Diagnosis not present

## 2019-05-23 DIAGNOSIS — R110 Nausea: Secondary | ICD-10-CM

## 2019-05-23 DIAGNOSIS — E039 Hypothyroidism, unspecified: Secondary | ICD-10-CM

## 2019-05-23 DIAGNOSIS — R197 Diarrhea, unspecified: Secondary | ICD-10-CM

## 2019-05-23 LAB — CBC WITH DIFFERENTIAL (CANCER CENTER ONLY)
Abs Immature Granulocytes: 0.01 10*3/uL (ref 0.00–0.07)
Basophils Absolute: 0 10*3/uL (ref 0.0–0.1)
Basophils Relative: 1 %
Eosinophils Absolute: 0.1 10*3/uL (ref 0.0–0.5)
Eosinophils Relative: 3 %
HCT: 39.7 % (ref 36.0–46.0)
Hemoglobin: 13 g/dL (ref 12.0–15.0)
Immature Granulocytes: 0 %
Lymphocytes Relative: 20 %
Lymphs Abs: 0.7 10*3/uL (ref 0.7–4.0)
MCH: 31.4 pg (ref 26.0–34.0)
MCHC: 32.7 g/dL (ref 30.0–36.0)
MCV: 95.9 fL (ref 80.0–100.0)
Monocytes Absolute: 0.4 10*3/uL (ref 0.1–1.0)
Monocytes Relative: 10 %
Neutro Abs: 2.5 10*3/uL (ref 1.7–7.7)
Neutrophils Relative %: 66 %
Platelet Count: 267 10*3/uL (ref 150–400)
RBC: 4.14 MIL/uL (ref 3.87–5.11)
RDW: 15.5 % (ref 11.5–15.5)
WBC Count: 3.7 10*3/uL — ABNORMAL LOW (ref 4.0–10.5)
nRBC: 0 % (ref 0.0–0.2)

## 2019-05-23 LAB — CMP (CANCER CENTER ONLY)
ALT: 295 U/L (ref 0–44)
AST: 317 U/L (ref 15–41)
Albumin: 3.1 g/dL — ABNORMAL LOW (ref 3.5–5.0)
Alkaline Phosphatase: 232 U/L — ABNORMAL HIGH (ref 38–126)
Anion gap: 9 (ref 5–15)
BUN: 8 mg/dL (ref 8–23)
CO2: 26 mmol/L (ref 22–32)
Calcium: 8.8 mg/dL — ABNORMAL LOW (ref 8.9–10.3)
Chloride: 105 mmol/L (ref 98–111)
Creatinine: 0.74 mg/dL (ref 0.44–1.00)
GFR, Est AFR Am: >60 mL/min (ref >60)
GFR, Estimated: >60 mL/min (ref >60)
Glucose, Bld: 183 mg/dL — ABNORMAL HIGH (ref 70–99)
Potassium: 3.7 mmol/L (ref 3.5–5.1)
Sodium: 140 mmol/L (ref 135–145)
Total Bilirubin: 0.5 mg/dL (ref 0.3–1.2)
Total Protein: 6 g/dL — ABNORMAL LOW (ref 6.5–8.1)

## 2019-05-23 MED ORDER — SODIUM CHLORIDE 0.9 % IV SOLN
Freq: Once | INTRAVENOUS | Status: AC
  Administered 2019-05-23: 09:00:00 via INTRAVENOUS
  Filled 2019-05-23: qty 250

## 2019-05-23 MED ORDER — HEPARIN SOD (PORK) LOCK FLUSH 100 UNIT/ML IV SOLN
500.0000 [IU] | Freq: Once | INTRAVENOUS | Status: AC | PRN
  Administered 2019-05-23: 11:00:00 500 [IU]
  Filled 2019-05-23: qty 5

## 2019-05-23 MED ORDER — ONDANSETRON HCL 4 MG/2ML IJ SOLN
8.0000 mg | Freq: Once | INTRAMUSCULAR | Status: AC
  Administered 2019-05-23: 8 mg via INTRAVENOUS

## 2019-05-23 MED ORDER — SODIUM CHLORIDE 0.9 % IV SOLN: Freq: Once | INTRAVENOUS | Status: DC

## 2019-05-23 MED ORDER — OLANZAPINE 5 MG PO TABS: 5.0000 mg | ORAL_TABLET | Freq: Every day | ORAL | 0 refills | Status: DC

## 2019-05-23 MED ORDER — ONDANSETRON HCL 4 MG/2ML IJ SOLN
INTRAMUSCULAR | Status: AC
  Filled 2019-05-23: qty 4

## 2019-05-23 MED ORDER — DEXAMETHASONE SODIUM PHOSPHATE 10 MG/ML IJ SOLN
INTRAMUSCULAR | Status: AC
  Filled 2019-05-23: qty 1

## 2019-05-23 MED ORDER — DEXAMETHASONE SODIUM PHOSPHATE 10 MG/ML IJ SOLN
10.0000 mg | Freq: Once | INTRAMUSCULAR | Status: AC
  Administered 2019-05-23: 10:00:00 10 mg via INTRAVENOUS

## 2019-05-23 MED ORDER — SODIUM CHLORIDE 0.9% FLUSH
10.0000 mL | INTRAVENOUS | Status: DC | PRN
  Administered 2019-05-23: 10 mL
  Filled 2019-05-23: qty 10

## 2019-05-23 MED ORDER — SODIUM CHLORIDE 0.9% FLUSH
10.0000 mL | Freq: Once | INTRAVENOUS | Status: AC | PRN
  Administered 2019-05-23: 10 mL
  Filled 2019-05-23: qty 10

## 2019-05-23 MED ORDER — ONDANSETRON HCL 4 MG PO TABS: 4.0000 mg | ORAL_TABLET | Freq: Three times a day (TID) | ORAL | 0 refills | Status: DC | PRN

## 2019-05-23 NOTE — Patient Instructions (Signed)
Rehydration, Adult Rehydration is the replacement of body fluids and salts and minerals (electrolytes) that are lost during dehydration. Dehydration is when there is not enough fluid or water in the body. This happens when you lose more fluids than you take in. Common causes of dehydration include:  Vomiting.  Diarrhea.  Excessive sweating, such as from heat exposure or exercise.  Taking medicines that cause the body to lose excess fluid (diuretics).  Impaired kidney function.  Not drinking enough fluid.  Certain illnesses or infections.  Certain poorly controlled long-term (chronic) illnesses, such as diabetes, heart disease, and kidney disease.  Symptoms of mild dehydration may include thirst, dry lips and mouth, dry skin, and dizziness. Symptoms of severe dehydration may include increased heart rate, confusion, fainting, and not urinating. You can rehydrate by drinking certain fluids or getting fluids through an IV tube, as told by your health care provider. What are the risks? Generally, rehydration is safe. However, one problem that can happen is taking in too much fluid (overhydration). This is rare. If overhydration happens, it can cause an electrolyte imbalance, kidney failure, or a decrease in salt (sodium) levels in the body. How to rehydrate Follow instructions from your health care provider for rehydration. The kind of fluid you should drink and the amount you should drink depend on your condition.  If directed by your health care provider, drink an oral rehydration solution (ORS). This is a drink designed to treat dehydration that is found in pharmacies and retail stores. ? Make an ORS by following instructions on the package. ? Start by drinking small amounts, about  cup (120 mL) every 5-10 minutes. ? Slowly increase how much you drink until you have taken the amount recommended by your health care provider.  Drink enough clear fluids to keep your urine clear or pale  yellow. If you were instructed to drink an ORS, finish the ORS first, then start slowly drinking other clear fluids. Drink fluids such as: ? Water. Do not drink only water. Doing that can lead to having too little sodium in your body (hyponatremia). ? Ice chips. ? Fruit juice that you have added water to (diluted juice). ? Low-calorie sports drinks.  If you are severely dehydrated, your health care provider may recommend that you receive fluids through an IV tube in the hospital.  Do not take sodium tablets. Doing that can lead to the condition of having too much sodium in your body (hypernatremia). Eating while you rehydrate Follow instructions from your health care provider about what to eat while you rehydrate. Your health care provider may recommend that you slowly begin eating regular foods in small amounts.  Eat foods that contain a healthy balance of electrolytes, such as bananas, oranges, potatoes, tomatoes, and spinach.  Avoid foods that are greasy or contain a lot of fat or sugar.  In some cases, you may get nutrition through a feeding tube that is passed through your nose and into your stomach (nasogastric tube, or NG tube). This may be done if you have uncontrolled vomiting or diarrhea. Beverages to avoid Certain beverages may make dehydration worse. While you rehydrate, avoid:  Alcohol.  Caffeine.  Drinks that contain a lot of sugar. These include: ? High-calorie sports drinks. ? Fruit juice that is not diluted. ? Soda.  Check nutrition labels to see how much sugar or caffeine a beverage contains. Signs of dehydration recovery You may be recovering from dehydration if:  You are urinating more often than before you started   rehydrating.  Your urine is clear or pale yellow.  Your energy level improves.  You vomit less frequently.  You have diarrhea less frequently.  Your appetite improves or returns to normal.  You feel less dizzy or less light-headed.  Your  skin tone and color start to look more normal. Contact a health care provider if:  You continue to have symptoms of mild dehydration, such as: ? Thirst. ? Dry lips. ? Slightly dry mouth. ? Dry, warm skin. ? Dizziness.  You continue to vomit or have diarrhea. Get help right away if:  You have symptoms of dehydration that get worse.  You feel: ? Confused. ? Weak. ? Like you are going to faint.  You have not urinated in 6-8 hours.  You have very dark urine.  You have trouble breathing.  Your heart rate while sitting still is over 100 beats a minute.  You cannot drink fluids without vomiting.  You have vomiting or diarrhea that: ? Gets worse. ? Does not go away.  You have a fever. This information is not intended to replace advice given to you by your health care provider. Make sure you discuss any questions you have with your health care provider. Document Released: 02/02/2012 Document Revised: 10/23/2017 Document Reviewed: 01/04/2016 Elsevier Patient Education  2020 Elsevier Inc.  Coronavirus (COVID-19) Are you at risk?  Are you at risk for the Coronavirus (COVID-19)?  To be considered HIGH RISK for Coronavirus (COVID-19), you have to meet the following criteria:  . Traveled to [place], [place], [place], [place] or [place]; or in the [place] to [place], [place], [place], or [place]; and have fever, cough, and shortness of breath within the last 2 weeks of travel OR . Been in close contact with a person diagnosed with COVID-19 within the last 2 weeks and have fever, cough, and shortness of breath . IF YOU DO NOT MEET THESE CRITERIA, YOU ARE CONSIDERED LOW RISK FOR COVID-19.  What to do if you are HIGH RISK for COVID-19?  . If you are having a medical emergency, call 911. . Seek medical care right away. Before you go to a doctor's office, urgent care or emergency department, call ahead and tell them about your recent travel, contact with someone diagnosed  with COVID-19, and your symptoms. You should receive instructions from your physician's office regarding next steps of care.  . When you arrive at healthcare provider, tell the healthcare staff immediately you have returned from visiting [place], [place], [place], [place] or [place]; or traveled in the [place] to [place], [place], [place], or [place]; in the last two weeks or you have been in close contact with a person diagnosed with COVID-19 in the last 2 weeks.   . Tell the health care staff about your symptoms: fever, cough and shortness of breath. . After you have been seen by a medical provider, you will be either: o Tested for (COVID-19) and discharged home on quarantine except to seek medical care if symptoms worsen, and asked to  - Stay home and avoid contact with others until you get your results (4-5 days)  - Avoid travel on public transportation if possible (such as bus, train, or airplane) or o Sent to the Emergency Department by EMS for evaluation, COVID-19 testing, and possible admission depending on your condition and test results.  What to do if you are LOW RISK for COVID-19?  Reduce your risk of any infection by using the same precautions   used for avoiding the common cold or flu:  . Wash your hands often with soap and warm water for at least 20 seconds.  If soap and water are not readily available, use an alcohol-based hand sanitizer with at least 60% alcohol.  . If coughing or sneezing, cover your mouth and nose by coughing or sneezing into the elbow areas of your shirt or coat, into a tissue or into your sleeve (not your hands). . Avoid shaking hands with others and consider head nods or verbal greetings only. . Avoid touching your eyes, nose, or mouth with unwashed hands.  . Avoid close contact with people who are sick. . Avoid places or events with large numbers of people in one location, like concerts or sporting events. . Carefully consider travel plans you have  or are making. . If you are planning any travel outside or inside the [place], visit the CDC's Travelers' Health webpage for the latest health notices. . If you have some symptoms but not all symptoms, continue to monitor at home and seek medical attention if your symptoms worsen. . If you are having a medical emergency, call 911.   ADDITIONAL HEALTHCARE OPTIONS FOR PATIENTS  [facility] / e-Visit: [email or web address]         [facility]: [phone]   Urgent Care: [phone]                   [facility]: [phone]   

## 2019-05-23 NOTE — Telephone Encounter (Signed)
Received results of elevated AST & ALT.  Left message for Dr Ernestina Penna RN.  Pt seeing MD this am.

## 2019-05-23 NOTE — Telephone Encounter (Signed)
Scheduled appt per 6/29 los. Spoke with patient and she is aware of her appt date and time. Added fluids for 7/7 in the book for approval.

## 2019-05-24 ENCOUNTER — Telehealth: Payer: Self-pay | Admitting: Licensed Clinical Social Worker

## 2019-05-24 ENCOUNTER — Other Ambulatory Visit: Payer: Self-pay | Admitting: Licensed Clinical Social Worker

## 2019-05-24 DIAGNOSIS — C250 Malignant neoplasm of head of pancreas: Secondary | ICD-10-CM

## 2019-05-24 LAB — CANCER ANTIGEN 19-9: CA 19-9: 717 U/mL — ABNORMAL HIGH (ref 0–35)

## 2019-05-24 NOTE — Telephone Encounter (Signed)
Spoke with Julie Rollins regarding her missed genetic counseling appointment. She wanted to reschedule, she will see me 7/9 at 3 pm.

## 2019-05-25 ENCOUNTER — Ambulatory Visit: Payer: Medicare HMO | Admitting: Hematology

## 2019-05-25 ENCOUNTER — Ambulatory Visit: Payer: Medicare HMO

## 2019-05-25 ENCOUNTER — Other Ambulatory Visit: Payer: Self-pay | Admitting: Hematology

## 2019-05-25 ENCOUNTER — Other Ambulatory Visit: Payer: Medicare HMO

## 2019-05-25 ENCOUNTER — Telehealth: Payer: Self-pay

## 2019-05-25 DIAGNOSIS — C250 Malignant neoplasm of head of pancreas: Secondary | ICD-10-CM

## 2019-05-25 NOTE — Telephone Encounter (Signed)
-----   Message from Hayden Pedro, [place] sent at 05/25/2019  8:59 AM EDT ----- Doristine Devoid thank you! We will get her in to see Dr. Lisbeth Renshaw. ----- Message ----- From: Truitt Merle, MD Sent: 05/25/2019   8:44 AM EDT To: Jonnie Finner, RN, #  Malachy Mood,  We discussed her case in GI conference this morning, and recommend her to have radiation before surgery. I called her this morning but no answers, please try to call her again and let her know, or remind me when she comes in for IVF tomorrow and I will talk to her  Bryson Ha, referral was made, thanks  Krista Blue

## 2019-05-25 NOTE — Telephone Encounter (Signed)
Left voice message for the patient, per Dr. [name] discussed her at the tumor board this morning, the recommendation is for her to have radiation treatment prior to surgery.  Radiation Oncology should be calling you with an appointment to see Dr. Lisbeth Renshaw.   Encouraged her to call back if she has any questions.

## 2019-05-27 ENCOUNTER — Other Ambulatory Visit: Payer: Self-pay

## 2019-05-27 ENCOUNTER — Inpatient Hospital Stay: Payer: Medicare HMO | Attending: Nurse Practitioner

## 2019-05-27 VITALS — BP 126/66 | HR 90 | Temp 99.2°F | Resp 17

## 2019-05-27 DIAGNOSIS — Z95828 Presence of other vascular implants and grafts: Secondary | ICD-10-CM

## 2019-05-27 DIAGNOSIS — C250 Malignant neoplasm of head of pancreas: Secondary | ICD-10-CM | POA: Diagnosis present

## 2019-05-27 DIAGNOSIS — R112 Nausea with vomiting, unspecified: Secondary | ICD-10-CM | POA: Insufficient documentation

## 2019-05-27 DIAGNOSIS — Z9221 Personal history of antineoplastic chemotherapy: Secondary | ICD-10-CM | POA: Insufficient documentation

## 2019-05-27 DIAGNOSIS — C784 Secondary malignant neoplasm of small intestine: Secondary | ICD-10-CM | POA: Insufficient documentation

## 2019-05-27 MED ORDER — SODIUM CHLORIDE 0.9% FLUSH
10.0000 mL | INTRAVENOUS | Status: DC | PRN
  Administered 2019-05-27: 10 mL
  Filled 2019-05-27: qty 10

## 2019-05-27 MED ORDER — SODIUM CHLORIDE 0.9 % IV SOLN
INTRAVENOUS | Status: AC
  Administered 2019-05-27: 09:00:00 via INTRAVENOUS
  Filled 2019-05-27 (×2): qty 250

## 2019-05-27 NOTE — Patient Instructions (Addendum)
Dehydration, Adult  Dehydration is a condition in which there is not enough fluid or water in the body. This happens when you lose more fluids than you take in. Important organs, such as the kidneys, brain, and heart, cannot function without a proper amount of fluids. Any loss of fluids from the body can lead to dehydration. Dehydration can range from mild to severe. This condition should be treated right away to prevent it from becoming severe. What are the causes? This condition may be caused by:  Vomiting.  Diarrhea.  Excessive sweating, such as from heat exposure or exercise.  Not drinking enough fluid, especially: ? When ill. ? While doing activity that requires a lot of energy.  Excessive urination.  Fever.  Infection.  Certain medicines, such as medicines that cause the body to lose excess fluid (diuretics).  Inability to access safe drinking water.  Reduced physical ability to get adequate water and food. What increases the risk? This condition is more likely to develop in people:  Who have a poorly controlled long-term (chronic) illness, such as diabetes, heart disease, or kidney disease.  Who are age 65 or older.  Who are disabled.  Who live in a place with high altitude.  Who play endurance sports. What are the signs or symptoms? Symptoms of mild dehydration may include:  Thirst.  Dry lips.  Slightly dry mouth.  Dry, warm skin.  Dizziness. Symptoms of moderate dehydration may include:  Very dry mouth.  Muscle cramps.  Dark urine. Urine may be the color of tea.  Decreased urine production.  Decreased tear production.  Heartbeat that is irregular or faster than normal (palpitations).  Headache.  Light-headedness, especially when you stand up from a sitting position.  Fainting (syncope). Symptoms of severe dehydration may include:  Changes in skin, such as: ? Cold and clammy skin. ? Blotchy (mottled) or pale skin. ? Skin that does  not quickly return to normal after being lightly pinched and released (poor skin turgor).  Changes in body fluids, such as: ? Extreme thirst. ? No tear production. ? Inability to sweat when body temperature is high, such as in hot weather. ? Very little urine production.  Changes in vital signs, such as: ? Weak pulse. ? Pulse that is more than 100 beats a minute when sitting still. ? Rapid breathing. ? Low blood pressure.  Other changes, such as: ? Sunken eyes. ? Cold hands and feet. ? Confusion. ? Lack of energy (lethargy). ? Difficulty waking up from sleep. ? Short-term weight loss. ? Unconsciousness. How is this diagnosed? This condition is diagnosed based on your symptoms and a physical exam. Blood and urine tests may be done to help confirm the diagnosis. How is this treated? Treatment for this condition depends on the severity. Mild or moderate dehydration can often be treated at home. Treatment should be started right away. Do not wait until dehydration becomes severe. Severe dehydration is an emergency and it needs to be treated in a hospital. Treatment for mild dehydration may include:  Drinking more fluids.  Replacing salts and minerals in your blood (electrolytes) that you may have lost. Treatment for moderate dehydration may include:  Drinking an oral rehydration solution (ORS). This is a drink that helps you replace fluids and electrolytes (rehydrate). It can be found at pharmacies and retail stores. Treatment for severe dehydration may include:  Receiving fluids through an IV tube.  Receiving an electrolyte solution through a feeding tube that is passed through your nose and   into your stomach (nasogastric tube, or NG tube).  Correcting any abnormalities in electrolytes.  Treating the underlying cause of dehydration. Follow these instructions at home:  If directed by your health care provider, drink an ORS: ? Make an ORS by following instructions on the  package. ? Start by drinking small amounts, about  cup (120 mL) every 5-10 minutes. ? Slowly increase how much you drink until you have taken the amount recommended by your health care provider.  Drink enough clear fluid to keep your urine clear or pale yellow. If you were told to drink an ORS, finish the ORS first, then start slowly drinking other clear fluids. Drink fluids such as: ? Water. Do not drink only water. Doing that can lead to having too little salt (sodium) in the body (hyponatremia). ? Ice chips. ? Fruit juice that you have added water to (diluted fruit juice). ? Low-calorie sports drinks.  Avoid: ? Alcohol. ? Drinks that contain a lot of sugar. These include high-calorie sports drinks, fruit juice that is not diluted, and soda. ? Caffeine. ? Foods that are greasy or contain a lot of fat or sugar.  Take over-the-counter and prescription medicines only as told by your health care provider.  Do not take sodium tablets. This can lead to having too much sodium in the body (hypernatremia).  Eat foods that contain a healthy balance of electrolytes, such as bananas, oranges, potatoes, tomatoes, and spinach.  Keep all follow-up visits as told by your health care provider. This is important. Contact a health care provider if:  You have abdominal pain that: ? Gets worse. ? Stays in one area (localizes).  You have a rash.  You have a stiff neck.  You are more irritable than usual.  You are sleepier or more difficult to wake up than usual.  You feel weak or dizzy.  You feel very thirsty.  You have urinated only a small amount of very dark urine over 6-8 hours. Get help right away if:  You have symptoms of severe dehydration.  You cannot drink fluids without vomiting.  Your symptoms get worse with treatment.  You have a fever.  You have a severe headache.  You have vomiting or diarrhea that: ? Gets worse. ? Does not go away.  You have blood or green matter  (bile) in your vomit.  You have blood in your stool. This may cause stool to look black and tarry.  You have not urinated in 6-8 hours.  You faint.  Your heart rate while sitting still is over 100 beats a minute.  You have trouble breathing. This information is not intended to replace advice given to you by your health care provider. Make sure you discuss any questions you have with your health care provider. Document Released: 11/10/2005 Document Revised: 10/23/2017 Document Reviewed: 01/04/2016 Elsevier Patient Education  2020 [place], Adult A central line is a thin, flexible tube (catheter) that is put in your vein. It can be used to:  Give you medicine.  Give you food and nutrients. Follow these instructions at home: Caring for the tube   Follow instructions from your doctor about: ? Flushing the tube with saline solution. ? Cleaning the tube and the area around it.  Only flush with clean (sterile) supplies. The supplies should be from your doctor, a pharmacy, or another place that your doctor recommends.  Before you  flush the tube or clean the area around the tube: ? Wash your hands with soap and water. If you cannot use soap and water, use hand sanitizer. ? Clean the central line hub with rubbing alcohol. Caring for your skin  Keep the area where the tube was put in clean and dry.  Every day, and when changing the bandage, check the skin around the central line for: ? Redness, swelling, or pain. ? Fluid or blood. ? Warmth. ? Pus. ? A bad smell. General instructions  Keep the tube clamped, unless it is being used.  Keep your supplies in a clean, dry location.  If you or someone else accidentally pulls on the tube, make sure: ? The bandage (dressing) is okay. ? There is no bleeding. ? The tube has not been pulled out.  Do not use scissors or sharp objects near the tube.  Do not swim or let the tube soak in a  tub.  Ask your doctor what activities are safe for you. Your doctor may tell you not to lift anything or move your arm too much.  Take over-the-counter and prescription medicines only as told by your doctor.  Change bandages as told by your doctor.  Keep your bandage dry. If a bandage gets wet, have it changed right away.  Keep all follow-up visits as told by your doctor. This is important. Throwing away supplies  Throw away any syringes in a trash (disposal) container that is only for sharp items (sharps container). You can buy a sharps container from a pharmacy, or you can make one by using an empty hard plastic bottle with a cover.  Place any used bandages or infusion bags into a plastic bag. Throw that bag in the trash. Contact a doctor if:  You have any of these where the tube was put in: ? Redness, swelling, or pain. ? Fluid or blood. ? A warm feeling. ? Pus or a bad smell. Get help right away if:  You have: ? A fever. ? Chills. ? Trouble getting enough air (shortness of breath). ? Trouble breathing. ? Pain in your chest. ? Swelling in your neck, face, chest, or arm.  You are coughing.  You feel your heart beating fast or skipping beats.  You feel dizzy or you pass out (faint).  There are red lines coming from where the tube was put in.  The area where the tube was put in is bleeding and the bleeding will not stop.  Your tube is hard to flush.  You do not get a blood return from the tube.  The tube gets loose or comes out.  The tube has a hole or a tear.  The tube leaks. Summary  A central line is a thin, flexible tube (catheter) that is put in your vein. It can be used to take blood for lab tests or to give you medicine.  Follow instructions from your doctor about flushing and cleaning the tube.  Keep the area where the tube was put in clean and dry.  Ask your doctor what activities are safe for you. This information is not intended to replace  advice given to you by your health care provider. Make sure you discuss any questions you have with your health care provider. Document Released: 10/27/2012 Document Revised: 03/02/2019 Document Reviewed: 11/27/2016 Elsevier Patient Education  2020 [employer].

## 2019-05-30 ENCOUNTER — Telehealth: Payer: Self-pay

## 2019-05-30 NOTE — Telephone Encounter (Signed)
Left a message for pt to verify telephone visit for pre reg

## 2019-05-31 ENCOUNTER — Inpatient Hospital Stay: Payer: Medicare HMO

## 2019-05-31 ENCOUNTER — Encounter: Payer: Self-pay | Admitting: Nurse Practitioner

## 2019-05-31 ENCOUNTER — Other Ambulatory Visit: Payer: Self-pay

## 2019-05-31 ENCOUNTER — Inpatient Hospital Stay (HOSPITAL_BASED_OUTPATIENT_CLINIC_OR_DEPARTMENT_OTHER): Payer: Medicare HMO | Admitting: Nurse Practitioner

## 2019-05-31 VITALS — BP 137/80 | HR 100 | Temp 98.9°F | Resp 18 | Ht 64.0 in | Wt 120.0 lb

## 2019-05-31 DIAGNOSIS — R600 Localized edema: Secondary | ICD-10-CM | POA: Diagnosis not present

## 2019-05-31 DIAGNOSIS — C250 Malignant neoplasm of head of pancreas: Secondary | ICD-10-CM

## 2019-05-31 DIAGNOSIS — Z95828 Presence of other vascular implants and grafts: Secondary | ICD-10-CM

## 2019-05-31 DIAGNOSIS — K208 Other esophagitis: Secondary | ICD-10-CM

## 2019-05-31 DIAGNOSIS — K831 Obstruction of bile duct: Secondary | ICD-10-CM

## 2019-05-31 DIAGNOSIS — C784 Secondary malignant neoplasm of small intestine: Secondary | ICD-10-CM | POA: Diagnosis not present

## 2019-05-31 DIAGNOSIS — Z9221 Personal history of antineoplastic chemotherapy: Secondary | ICD-10-CM

## 2019-05-31 DIAGNOSIS — Z853 Personal history of malignant neoplasm of breast: Secondary | ICD-10-CM

## 2019-05-31 DIAGNOSIS — R197 Diarrhea, unspecified: Secondary | ICD-10-CM

## 2019-05-31 DIAGNOSIS — R112 Nausea with vomiting, unspecified: Secondary | ICD-10-CM | POA: Diagnosis not present

## 2019-05-31 DIAGNOSIS — Z923 Personal history of irradiation: Secondary | ICD-10-CM

## 2019-05-31 LAB — CMP (CANCER CENTER ONLY)
ALT: 78 U/L — ABNORMAL HIGH (ref 0–44)
AST: 39 U/L (ref 15–41)
Albumin: 3.2 g/dL — ABNORMAL LOW (ref 3.5–5.0)
Alkaline Phosphatase: 171 U/L — ABNORMAL HIGH (ref 38–126)
Anion gap: 8 (ref 5–15)
BUN: 11 mg/dL (ref 8–23)
CO2: 24 mmol/L (ref 22–32)
Calcium: 9.1 mg/dL (ref 8.9–10.3)
Chloride: 109 mmol/L (ref 98–111)
Creatinine: 0.68 mg/dL (ref 0.44–1.00)
GFR, Est AFR Am: >60 mL/min (ref >60)
GFR, Estimated: >60 mL/min (ref >60)
Glucose, Bld: 112 mg/dL — ABNORMAL HIGH (ref 70–99)
Potassium: 3.8 mmol/L (ref 3.5–5.1)
Sodium: 141 mmol/L (ref 135–145)
Total Bilirubin: 0.4 mg/dL (ref 0.3–1.2)
Total Protein: 6.4 g/dL — ABNORMAL LOW (ref 6.5–8.1)

## 2019-05-31 LAB — CBC WITH DIFFERENTIAL (CANCER CENTER ONLY)
Abs Immature Granulocytes: 0.01 10*3/uL (ref 0.00–0.07)
Basophils Absolute: 0 10*3/uL (ref 0.0–0.1)
Basophils Relative: 1 %
Eosinophils Absolute: 0.4 10*3/uL (ref 0.0–0.5)
Eosinophils Relative: 7 %
HCT: 38.8 % (ref 36.0–46.0)
Hemoglobin: 12.7 g/dL (ref 12.0–15.0)
Immature Granulocytes: 0 %
Lymphocytes Relative: 21 %
Lymphs Abs: 1.1 10*3/uL (ref 0.7–4.0)
MCH: 31.5 pg (ref 26.0–34.0)
MCHC: 32.7 g/dL (ref 30.0–36.0)
MCV: 96.3 fL (ref 80.0–100.0)
Monocytes Absolute: 0.7 10*3/uL (ref 0.1–1.0)
Monocytes Relative: 14 %
Neutro Abs: 3 10*3/uL (ref 1.7–7.7)
Neutrophils Relative %: 57 %
Platelet Count: 265 10*3/uL (ref 150–400)
RBC: 4.03 MIL/uL (ref 3.87–5.11)
RDW: 14.8 % (ref 11.5–15.5)
WBC Count: 5.2 10*3/uL (ref 4.0–10.5)
nRBC: 0 % (ref 0.0–0.2)

## 2019-05-31 MED ORDER — SODIUM CHLORIDE 0.9% FLUSH
10.0000 mL | Freq: Once | INTRAVENOUS | Status: AC | PRN
  Administered 2019-05-31: 10 mL
  Filled 2019-05-31: qty 10

## 2019-05-31 MED ORDER — SODIUM CHLORIDE 0.9% FLUSH
10.0000 mL | INTRAVENOUS | Status: DC | PRN
  Administered 2019-05-31: 10 mL
  Filled 2019-05-31: qty 10

## 2019-05-31 MED ORDER — ALTEPLASE 2 MG IJ SOLR
2.0000 mg | Freq: Once | INTRAMUSCULAR | Status: DC | PRN
  Filled 2019-05-31: qty 2

## 2019-05-31 MED ORDER — SODIUM CHLORIDE 0.9 % IV SOLN
Freq: Once | INTRAVENOUS | Status: AC
  Administered 2019-05-31: 13:00:00 via INTRAVENOUS
  Filled 2019-05-31: qty 250

## 2019-05-31 MED ORDER — HEPARIN SOD (PORK) LOCK FLUSH 100 UNIT/ML IV SOLN
500.0000 [IU] | Freq: Once | INTRAVENOUS | Status: AC | PRN
  Administered 2019-05-31: 15:00:00 500 [IU]
  Filled 2019-05-31: qty 5

## 2019-05-31 NOTE — Progress Notes (Signed)
GI Location of Tumor / Histology: Malignant neoplasm of head of pancreas  Julie Rollins presented   CT Chest 04/08/2019: No evidence of metastatic disease in the chest.  CT AP 03/28/2019: Interval development of 20 x 14 mm ill-defined low density in pancreatic head concerning for possible neoplasm or malignancy.  This appears to be resulting in severe intrahepatic and extrahepatic biliary dilatation.  ERCP is recommended for further evaluation.  Sigmoid diverticulosis without inflammation.  Biopsies of Duodenum 03/29/2019 Pathology Results Diagnosis BILE DUCT BRUSHING (SPECIMEN 2 OF 2, COLLECTED ON 03/29/2019): HIGHLY SUSPICIOUS FOR MALIGNANCY.  Initial Biopsy  Diagnosis FINE NEEDLE ASPIRATION, ENDOSCOPIC EGUS, PANCREATIC HEAD MASS (SPECIMEN 1 OF 2 COLLECTED 03/29/2019) MALIGNANT CELLS CONSISTENT WITH ADENOCARCINOMA      Past/Anticipated interventions by surgeon, if any:  Dr. Barry Dienes 06/06/2019   Past/Anticipated interventions by medical oncology, if any:  Dr. [name] 05/23/2019 -Neoadjuvant FOLFIRINOX q3 weeks beginning 04/13/2019.  Dose reduced with cycle 1.  She did poorly with second cycle, and has not recovered since the treatment on 6/3. -Due to her poor tolerance, will stop chemo.  I do not think she can tolerate gemcitabine and abraxane either. -I reviewed her restaging CT AP from 6/26 which showed slightly increased of the primary pancreatic tumor (from 1.7 cm to 1.9 cm), no node or liver metastasis. -We will discuss her case in GI tumor board this week.  I will refer her back to see her surgeon Dr. Barry Dienes, to see if she can proceed with Whipple surgery when she recovers from chemo, or do neoadjuvant radiation before surgery.   Weight changes, if any: Lost about 17 pounds from chemo  Bowel/Bladder complaints, if any: Loose bowels 2-3 times a day.  Nausea / Vomiting, if any: No  Pain issues, if any:  No   SAFETY ISSUES:  Prior radiation? 3 weeks of radiation at [facility],    Pacemaker/ICD? No   Possible current pregnancy? Hysterectomy  Is the patient on methotrexate? No  Current Complaints/Details: - History of left Breast cancer 2000, stage 0, Lumpectomy -Genetics 06/02/2019 -Has a hard time laying flat.

## 2019-05-31 NOTE — Progress Notes (Signed)
Nutrition Follow-up:  Patient with pancreatic cancer with duodenal involvement.  Patient has stopped chemotherapy and planning radiation therapy.    Spoke with patient via phone this am.  Patient reports that she is getting ready to come to the cancer center for appointment.  Reports that appetite is little bit better still not back to normal.  Reports that she is able to eat some chicken now, cucumbers, corn.  Can't tolerate much bread.  Concerned about not having much energy.  Continues to drink carnation breakfast essentials usually 1 time per day.    Reports loose stool, not diarrhea about the same.  Reports nausea is some better.      Medications: reviewed  Labs: reviewed  Anthropometrics:   Weight 120 lb on 6/29 decreased from 123 lb on 6/22.    NUTRITION DIAGNOSIS: Inadequate oral intake improving   INTERVENTION:  Recommend patient drink carnation breakfast essentials 2 times per day. Patient distracted with upcoming radiation appointment and hard to stay on track with diet related information and education today.  Encouraged good sources of protein at meals/snacks.  Reviewed sources of protein    MONITORING, EVALUATION, GOAL: Patient will consume adequate calories and protein to maintain weight during treatment   NEXT VISIT: phone f/u July 28  Julie Sami B. Julie Rollins, Julie Rollins, Julie Rollins Registered Dietitian [phone] (pager)

## 2019-05-31 NOTE — Progress Notes (Signed)
[place]   [phone] [phone]   Clinic Follow up Note   Patient Care Team: Nolene Ebbs, MD as PCP - General (Internal Medicine) Stark Klein, MD as Consulting Physician (General Surgery) Truitt Merle, MD as Consulting Physician (Hematology) Alla Feeling, NP as Nurse Practitioner (Nurse Practitioner) Arna Snipe, RN as Oncology Nurse Navigator 05/31/2019  CHIEF COMPLAINT: f/u pancreas cancer    SUMMARY OF ONCOLOGIC HISTORY: Oncology History  Malignant neoplasm of head of pancreas ([place])  03/28/2019 Imaging   CT AP w contrast IMPRESSION: Interval development of 20 x 14 mm ill-defined low density in pancreatic head concerning for possible neoplasm or malignancy. This appears to be resulting in severe intrahepatic and extrahepatic biliary dilatation. ERCP is recommended for further evaluation.  Sigmoid diverticulosis without inflammation.   03/29/2019 Initial Biopsy   Diagnosis FINE NEEDLE ASPIRATION, ENDOSCOPIC EGUS, PANCREATIC HEAD MASS (SPECIMEN 1 OF 2 COLLECTED 03/29/2019) MALIGNANT CELLS CONSISTENT WITH ADENOCARCINOMA.   03/29/2019 Initial Biopsy   Diagnosis Duodenum, Biopsy, abnormal mucosa at major papilla - ADENOCARCINOMA. SEE NOTE Diagnosis Note The duodenal mucosa is negative for dysplasia. The findings are consistent with duodenal involvement from patient's suspected primary pancreatic adenocarcinoma. Dr. Melina Copa has reviewed this case and concurs with the above interpretation. Dr. Ardis Hughs was notified on 03/30/2019.   03/29/2019 Pathology Results   Diagnosis BILE DUCT BRUSHING (SPECIMEN 2 OF 2, COLLECTED ON 03/29/2019): HIGHLY SUSPICIOUS FOR MALIGNANCY.   03/29/2019 Procedure   ERCP with stenting of malignant distal CBD stricture per Dr. Ardis Hughs   04/04/2019 Initial Diagnosis   Malignant neoplasm of head of pancreas ([place])   04/08/2019 Imaging   CT Chest IMPRESSION: No evidence of metastatic disease in the chest. Aortic  Atherosclerosis (ICD10-I70.0).    04/13/2019 - 05/10/2019 Chemotherapy   neoadjuvant FOLFIRINOX q3weeks beginning 04/13/19; dose-reduced with cycle 1     CURRENT THERAPY: supportive care   INTERVAL HISTORY: Ms. Jun[place] returns for follow up as scheduled. She was seen last week and continues to receive frequent IVF. She is feeling a little better. Has started tolerating more protein including chicken. She tried beef liver last week and vomited, but otherwise has not required anti-emetics in 1 week. She drinks carnation breakfast 1-2 per day, still not drinking much liquid. She is more active but still slightly weak. Stool is still loose, has had 2 BM today. Doesn't take imodium/lomotil unless she has 3-4 per day. Urine is normal. Has occasional LLQ and back pain which is not new. Takes tylenol PRN. She has not taken oral K in 2 weeks because of bad taste.    MEDICAL HISTORY:  Past Medical History:  Diagnosis Date  . Anxiety   . Blood transfusion without reported diagnosis   . Breast CA ([place])    s/p lumpectomy and radiation 2000  . Breast cancer ([place])   . Colitis    2007  . Hernia, epigastric   . HTN (hypertension)    "mild hypertension", was on BP med years ago but it caused orthostatic hypotension and she has not been medicated since  . Hypothyroid   . Pancreatic cancer ([place]) dx'd 03/2019  . Personal history of radiation therapy 2001  . Vertigo   . Vertigo     SURGICAL HISTORY: Past Surgical History:  Procedure Laterality Date  . ABDOMINAL HYSTERECTOMY     59yrs ago  . ANTERIOR CERVICAL DECOMP/DISCECTOMY FUSION N/A 08/02/2018   Procedure: ANTERIOR CERVICAL DECOMPRESSION/DISCECTOMY FUSION CERVICAL FIVE- CERVICAL SIX;  Surgeon: Earnie Larsson, MD;  Location:  [facility] OR;  Service: Neurosurgery;  Laterality: N/A;  ANTERIOR CERVICAL DECOMPRESSION/DISCECTOMY FUSION CERVICAL FIVE- CERVICAL SIX  . BILIARY BRUSHING  03/29/2019   Procedure: BILIARY BRUSHING;  Surgeon: [name], MD;  Location:  [facility] ENDOSCOPY;  Service: Endoscopy;;  . BILIARY STENT PLACEMENT  03/29/2019   Procedure: BILIARY STENT PLACEMENT;  Surgeon: [name], MD;  Location[facility] ENDOSCOPY;  Service: Endoscopy;;  . BIOPSY  03/29/2019   Procedure: BIOPSY;  Surgeon: [name], MD;  Location: [facility] ENDOSCOPY;  Service: Endoscopy;;  . BREAST LUMPECTOMY     2000  . ERCP N/A 03/29/2019   Procedure: ENDOSCOPIC RETROGRADE CHOLANGIOPANCREATOGRAPHY (ERCP);  Surgeon: [name], MD;  Location: [facility] ENDOSCOPY;  Service: Endoscopy;  Laterality: N/A;  . ESOPHAGOGASTRODUODENOSCOPY (EGD) WITH PROPOFOL N/A 03/29/2019   Procedure: ESOPHAGOGASTRODUODENOSCOPY (EGD) WITH PROPOFOL;  Surgeon: [name], MD;  Location: [facility] ENDOSCOPY;  Service: Endoscopy;  Laterality: N/A;  . EUS N/A 03/29/2019   Procedure: UPPER ENDOSCOPIC ULTRASOUND (EUS) LINEAR;  Surgeon: [name], MD;  Location: [facility] ENDOSCOPY;  Service: Endoscopy;  Laterality: N/A;  . FINE NEEDLE ASPIRATION  03/29/2019   Procedure: FINE NEEDLE ASPIRATION (FNA);  Surgeon: [name], MD;  Location: [facility] ENDOSCOPY;  Service: Endoscopy;;  . HERNIA REPAIR     20+ years ago  . PORTACATH PLACEMENT N/A 04/12/2019   Procedure: INSERTION PORT-A-CATH LEFT SUBCLAVIAN;  Surgeon: Stark Klein, MD;  Location: [facility];  Service: General;  Laterality: N/A;  . RETINAL DETACHMENT SURGERY    . SPHINCTEROTOMY  03/29/2019   Procedure: SPHINCTEROTOMY;  Surgeon: [name], MD;  Location: [facility] ENDOSCOPY;  Service: Endoscopy;;    I have reviewed the social history and family history with the patient and they are unchanged from previous note.  ALLERGIES:  is allergic to codeine.  MEDICATIONS:  Current Outpatient Medications  Medication Sig Dispense Refill  . acetaminophen (TYLENOL) 325 MG tablet Take 2 tablets (650 mg total) by mouth every 8 (eight) hours.    Marland Kitchen acetaminophen (TYLENOL) 500 MG tablet Take 1,000 mg by mouth every 6 (six) hours as needed.    . diltiazem  (CARDIZEM CD) 180 MG 24 hr capsule Take 180 mg by mouth daily.    . diphenoxylate-atropine (LOMOTIL) 2.5-0.025 MG tablet Take 2 tablets by mouth 4 (four) times daily as needed for diarrhea or loose stools. 60 tablet 1  . HYDROcodone-acetaminophen (NORCO/VICODIN) 5-325 MG tablet Take 0.5-1 tablets by mouth every 6 (six) hours as needed for moderate pain or severe pain. 8 tablet 0  . levothyroxine (SYNTHROID, LEVOTHROID) 50 MCG tablet Take 50 mcg by mouth every morning.    . lipase/protease/amylase (CREON) 12000 units CPEP capsule Take 1 capsule with snack and 2 capsules with meals daily 270 capsule 1  . LORazepam (ATIVAN) 0.5 MG tablet Take 0.5 tablets (0.25 mg total) by mouth every 8 (eight) hours as needed for anxiety. 30 tablet 0  . mirtazapine (REMERON) 7.5 MG tablet Take 1 tablet (7.5 mg total) by mouth at bedtime. 30 tablet 1  . OLANZapine (ZYPREXA) 5 MG tablet Take 1 tablet (5 mg total) by mouth daily. 30 tablet 0  . ondansetron (ZOFRAN) 4 MG tablet Take 1 tablet (4 mg total) by mouth every 8 (eight) hours as needed for nausea or vomiting. 20 tablet 0  . pantoprazole (PROTONIX) 20 MG tablet Take 1 tablet (20 mg total) by mouth 2 (two) times daily before a meal. 60 tablet 2  . potassium chloride 20 MEQ/15ML (  10%) SOLN Take 15 mLs (20 mEq total) by mouth daily. 473 mL 0   No current facility-administered medications for this visit.     PHYSICAL EXAMINATION: ECOG PERFORMANCE STATUS: 1 - Symptomatic but completely ambulatory  Vitals:   05/31/19 1147  BP: 137/80  Pulse: 100  Resp: 18  Temp: 98.9 F (37.2 C)  SpO2: 100%   Filed Weights   05/31/19 1147  Weight: 120 lb (54.4 kg)    GENERAL:alert, no distress and comfortable SKIN: no obvious rash  EYES: sclera clear LUNGS: respirations even and unlabored  HEART: trace bilateral lower extremity edema Musculoskeletal:no cyanosis of digits  NEURO: alert & oriented x 3 with fluent speech, no focal motor/sensory deficits. Normal gait  PAC without erythema  Limited exam for covid19 outbreak    LABORATORY DATA:  I have reviewed the data as listed CBC Latest Ref Rng & Units 05/31/2019 05/23/2019 05/19/2019  WBC 4.0 - 10.5 K/uL 5.2 3.7(L) 5.9  Hemoglobin 12.0 - 15.0 g/dL 12.7 13.0 12.6  Hematocrit 36.0 - 46.0 % 38.8 39.7 39.1  Platelets 150 - 400 K/uL 265 267 350     CMP Latest Ref Rng & Units 05/31/2019 05/23/2019 05/19/2019  Glucose 70 - 99 mg/dL 112(H) 183(H) 115(H)  BUN 8 - 23 mg/dL 11 8 11   Creatinine 0.44 - 1.00 mg/dL 0.68 0.74 0.70  Sodium 135 - 145 mmol/L 141 140 140  Potassium 3.5 - 5.1 mmol/L 3.8 3.7 3.5  Chloride 98 - 111 mmol/L 109 105 103  CO2 22 - 32 mmol/L 24 26 28   Calcium 8.9 - 10.3 mg/dL 9.1 8.8(L) 8.7(L)  Total Protein 6.5 - 8.1 g/dL 6.4(L) 6.0(L) 5.9(L)  Total Bilirubin 0.3 - 1.2 mg/dL 0.4 0.5 0.5  Alkaline Phos 38 - 126 U/L 171(H) 232(H) 219(H)  AST 15 - 41 U/L 39 317(HH) 234(HH)  ALT 0 - 44 U/L 78(H) 295(HH) 219(H)      RADIOGRAPHIC STUDIES: I have personally reviewed the radiological images as listed and agreed with the findings in the report. No results found.   ASSESSMENT & PLAN: 73 yo female with  1. Adenocarcinoma of pancreatic head, cT2N0M0, with duodenal involvements/p 2 cycles neoadjuvant FOLFIRINOX, discontinued due to poor tolerance  2. Obstructive juandice s/p ERCP with stenting of malignant stricture of distal CBD per Dr. Ardis Hughs 03/29/19. Resolved  3. CINV, diarrhea  4. Lower leg edema - negative doppler on 04/15/19  5. Acid-related esophagitis with bleeding - per EGD on 03/29/19, on PPI  6. Social support - utilizes [facility]; followed by [place]  7. Genetics - appt 06/02/19   Ms. [place] appears improved. Her weight is stable from last week. Diet and GI symptoms improving. Labs reviewed. CBC normal. LFTs significantly improved from last week. I cautioned her to avoid heavy tylenol use due to recent transaminitis. She has not been on oral K for 2 weeks, level  is normal today; ok to remain off potassium supplement.   Currently not on chemotherapy. She has rad onc and genetics appointments this week. Will see Dr. Barry Dienes on 7/13. She will return for f/u and IVF if she needs it late next week. Will get IVF today. I strongly encouraged her to eat and drink more, remain active and mobile to help build her strength, she agrees. She wants to get dental cleaning and a hair cut, I strongly advised her to schedule 1st or last appt of the day when office is not crowded, wear a mask, and practice good hand  hygiene. She understands   All questions were answered. The patient knows to call the clinic with any problems, questions or concerns. No barriers to learning was detected.     Alla Feeling, NP 05/31/19

## 2019-05-31 NOTE — Progress Notes (Addendum)
Tumor Board recommendations from 05/25/2019  Tolerating chemotherapy poorly, will probably stop.  Resectable pancreatic tumor. Dr. Barry Dienes to re-evaluate for possible surgery. Stereotactic radiation while waiting for surgery.

## 2019-05-31 NOTE — Patient Instructions (Signed)
Dehydration, Adult  Dehydration is a condition in which there is not enough fluid or water in the body. This happens when you lose more fluids than you take in. Important organs, such as the kidneys, brain, and heart, cannot function without a proper amount of fluids. Any loss of fluids from the body can lead to dehydration. Dehydration can range from mild to severe. This condition should be treated right away to prevent it from becoming severe. What are the causes? This condition may be caused by:  Vomiting.  Diarrhea.  Excessive sweating, such as from heat exposure or exercise.  Not drinking enough fluid, especially: ? When ill. ? While doing activity that requires a lot of energy.  Excessive urination.  Fever.  Infection.  Certain medicines, such as medicines that cause the body to lose excess fluid (diuretics).  Inability to access safe drinking water.  Reduced physical ability to get adequate water and food. What increases the risk? This condition is more likely to develop in people:  Who have a poorly controlled long-term (chronic) illness, such as diabetes, heart disease, or kidney disease.  Who are age 65 or older.  Who are disabled.  Who live in a place with high altitude.  Who play endurance sports. What are the signs or symptoms? Symptoms of mild dehydration may include:  Thirst.  Dry lips.  Slightly dry mouth.  Dry, warm skin.  Dizziness. Symptoms of moderate dehydration may include:  Very dry mouth.  Muscle cramps.  Dark urine. Urine may be the color of tea.  Decreased urine production.  Decreased tear production.  Heartbeat that is irregular or faster than normal (palpitations).  Headache.  Light-headedness, especially when you stand up from a sitting position.  Fainting (syncope). Symptoms of severe dehydration may include:  Changes in skin, such as: ? Cold and clammy skin. ? Blotchy (mottled) or pale skin. ? Skin that does  not quickly return to normal after being lightly pinched and released (poor skin turgor).  Changes in body fluids, such as: ? Extreme thirst. ? No tear production. ? Inability to sweat when body temperature is high, such as in hot weather. ? Very little urine production.  Changes in vital signs, such as: ? Weak pulse. ? Pulse that is more than 100 beats a minute when sitting still. ? Rapid breathing. ? Low blood pressure.  Other changes, such as: ? Sunken eyes. ? Cold hands and feet. ? Confusion. ? Lack of energy (lethargy). ? Difficulty waking up from sleep. ? Short-term weight loss. ? Unconsciousness. How is this diagnosed? This condition is diagnosed based on your symptoms and a physical exam. Blood and urine tests may be done to help confirm the diagnosis. How is this treated? Treatment for this condition depends on the severity. Mild or moderate dehydration can often be treated at home. Treatment should be started right away. Do not wait until dehydration becomes severe. Severe dehydration is an emergency and it needs to be treated in a hospital. Treatment for mild dehydration may include:  Drinking more fluids.  Replacing salts and minerals in your blood (electrolytes) that you may have lost. Treatment for moderate dehydration may include:  Drinking an oral rehydration solution (ORS). This is a drink that helps you replace fluids and electrolytes (rehydrate). It can be found at pharmacies and retail stores. Treatment for severe dehydration may include:  Receiving fluids through an IV tube.  Receiving an electrolyte solution through a feeding tube that is passed through your nose and   into your stomach (nasogastric tube, or NG tube).  Correcting any abnormalities in electrolytes.  Treating the underlying cause of dehydration. Follow these instructions at home:  If directed by your health care provider, drink an ORS: ? Make an ORS by following instructions on the  package. ? Start by drinking small amounts, about  cup (120 mL) every 5-10 minutes. ? Slowly increase how much you drink until you have taken the amount recommended by your health care provider.  Drink enough clear fluid to keep your urine clear or pale yellow. If you were told to drink an ORS, finish the ORS first, then start slowly drinking other clear fluids. Drink fluids such as: ? Water. Do not drink only water. Doing that can lead to having too little salt (sodium) in the body (hyponatremia). ? Ice chips. ? Fruit juice that you have added water to (diluted fruit juice). ? Low-calorie sports drinks.  Avoid: ? Alcohol. ? Drinks that contain a lot of sugar. These include high-calorie sports drinks, fruit juice that is not diluted, and soda. ? Caffeine. ? Foods that are greasy or contain a lot of fat or sugar.  Take over-the-counter and prescription medicines only as told by your health care provider.  Do not take sodium tablets. This can lead to having too much sodium in the body (hypernatremia).  Eat foods that contain a healthy balance of electrolytes, such as bananas, oranges, potatoes, tomatoes, and spinach.  Keep all follow-up visits as told by your health care provider. This is important. Contact a health care provider if:  You have abdominal pain that: ? Gets worse. ? Stays in one area (localizes).  You have a rash.  You have a stiff neck.  You are more irritable than usual.  You are sleepier or more difficult to wake up than usual.  You feel weak or dizzy.  You feel very thirsty.  You have urinated only a small amount of very dark urine over 6-8 hours. Get help right away if:  You have symptoms of severe dehydration.  You cannot drink fluids without vomiting.  Your symptoms get worse with treatment.  You have a fever.  You have a severe headache.  You have vomiting or diarrhea that: ? Gets worse. ? Does not go away.  You have blood or green matter  (bile) in your vomit.  You have blood in your stool. This may cause stool to look black and tarry.  You have not urinated in 6-8 hours.  You faint.  Your heart rate while sitting still is over 100 beats a minute.  You have trouble breathing. This information is not intended to replace advice given to you by your health care provider. Make sure you discuss any questions you have with your health care provider. Document Released: 11/10/2005 Document Revised: 10/23/2017 Document Reviewed: 01/04/2016 Elsevier Patient Education  2020 [place], Adult A central line is a thin, flexible tube (catheter) that is put in your vein. It can be used to:  Give you medicine.  Give you food and nutrients. Follow these instructions at home: Caring for the tube   Follow instructions from your doctor about: ? Flushing the tube with saline solution. ? Cleaning the tube and the area around it.  Only flush with clean (sterile) supplies. The supplies should be from your doctor, a pharmacy, or another place that your doctor recommends.  Before you  flush the tube or clean the area around the tube: ? Wash your hands with soap and water. If you cannot use soap and water, use hand sanitizer. ? Clean the central line hub with rubbing alcohol. Caring for your skin  Keep the area where the tube was put in clean and dry.  Every day, and when changing the bandage, check the skin around the central line for: ? Redness, swelling, or pain. ? Fluid or blood. ? Warmth. ? Pus. ? A bad smell. General instructions  Keep the tube clamped, unless it is being used.  Keep your supplies in a clean, dry location.  If you or someone else accidentally pulls on the tube, make sure: ? The bandage (dressing) is okay. ? There is no bleeding. ? The tube has not been pulled out.  Do not use scissors or sharp objects near the tube.  Do not swim or let the tube soak in a  tub.  Ask your doctor what activities are safe for you. Your doctor may tell you not to lift anything or move your arm too much.  Take over-the-counter and prescription medicines only as told by your doctor.  Change bandages as told by your doctor.  Keep your bandage dry. If a bandage gets wet, have it changed right away.  Keep all follow-up visits as told by your doctor. This is important. Throwing away supplies  Throw away any syringes in a trash (disposal) container that is only for sharp items (sharps container). You can buy a sharps container from a pharmacy, or you can make one by using an empty hard plastic bottle with a cover.  Place any used bandages or infusion bags into a plastic bag. Throw that bag in the trash. Contact a doctor if:  You have any of these where the tube was put in: ? Redness, swelling, or pain. ? Fluid or blood. ? A warm feeling. ? Pus or a bad smell. Get help right away if:  You have: ? A fever. ? Chills. ? Trouble getting enough air (shortness of breath). ? Trouble breathing. ? Pain in your chest. ? Swelling in your neck, face, chest, or arm.  You are coughing.  You feel your heart beating fast or skipping beats.  You feel dizzy or you pass out (faint).  There are red lines coming from where the tube was put in.  The area where the tube was put in is bleeding and the bleeding will not stop.  Your tube is hard to flush.  You do not get a blood return from the tube.  The tube gets loose or comes out.  The tube has a hole or a tear.  The tube leaks. Summary  A central line is a thin, flexible tube (catheter) that is put in your vein. It can be used to take blood for lab tests or to give you medicine.  Follow instructions from your doctor about flushing and cleaning the tube.  Keep the area where the tube was put in clean and dry.  Ask your doctor what activities are safe for you. This information is not intended to replace  advice given to you by your health care provider. Make sure you discuss any questions you have with your health care provider. Document Released: 10/27/2012 Document Revised: 03/02/2019 Document Reviewed: 11/27/2016 Elsevier Patient Education  2020 [employer].

## 2019-06-01 ENCOUNTER — Ambulatory Visit
Admission: RE | Admit: 2019-06-01 | Discharge: 2019-06-01 | Disposition: A | Discharge status: Home / Self Care | Payer: Medicare HMO | Source: Ambulatory Visit | Attending: Radiation Oncology | Admitting: Radiation Oncology

## 2019-06-01 ENCOUNTER — Other Ambulatory Visit: Payer: Self-pay

## 2019-06-01 ENCOUNTER — Encounter: Payer: Self-pay | Admitting: Radiation Oncology

## 2019-06-01 ENCOUNTER — Other Ambulatory Visit: Payer: Self-pay | Admitting: Gastroenterology

## 2019-06-01 ENCOUNTER — Telehealth: Payer: Self-pay | Admitting: Nurse Practitioner

## 2019-06-01 VITALS — Ht 64.0 in | Wt 120.0 lb

## 2019-06-01 DIAGNOSIS — C250 Malignant neoplasm of head of pancreas: Secondary | ICD-10-CM

## 2019-06-01 NOTE — Telephone Encounter (Signed)
Scheduled appt per 7/7 los.   Called patient and the phone kept ringing.  Will try calling the patient again to confirm the appt date and time.
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
## 2019-06-02 ENCOUNTER — Inpatient Hospital Stay: Payer: Medicare HMO

## 2019-06-02 ENCOUNTER — Telehealth: Payer: Self-pay | Admitting: Nurse Practitioner

## 2019-06-02 ENCOUNTER — Inpatient Hospital Stay (HOSPITAL_BASED_OUTPATIENT_CLINIC_OR_DEPARTMENT_OTHER): Payer: Medicare HMO | Admitting: Licensed Clinical Social Worker

## 2019-06-02 ENCOUNTER — Encounter: Payer: Self-pay | Admitting: Licensed Clinical Social Worker

## 2019-06-02 ENCOUNTER — Other Ambulatory Visit: Payer: Self-pay

## 2019-06-02 DIAGNOSIS — Z853 Personal history of malignant neoplasm of breast: Secondary | ICD-10-CM | POA: Diagnosis not present

## 2019-06-02 DIAGNOSIS — Z800 Family history of malignant neoplasm of digestive organs: Secondary | ICD-10-CM | POA: Diagnosis not present

## 2019-06-02 DIAGNOSIS — Z808 Family history of malignant neoplasm of other organs or systems: Secondary | ICD-10-CM

## 2019-06-02 DIAGNOSIS — C250 Malignant neoplasm of head of pancreas: Secondary | ICD-10-CM | POA: Diagnosis not present

## 2019-06-02 DIAGNOSIS — Z8052 Family history of malignant neoplasm of bladder: Secondary | ICD-10-CM | POA: Diagnosis not present

## 2019-06-02 NOTE — Telephone Encounter (Signed)
Called patient to inform her of her scheduled appts, she is aware of her appt date and time.

## 2019-06-02 NOTE — Progress Notes (Signed)
REFERRING PROVIDER: Truitt Merle, MD [place],  [place] [address]  PRIMARY PROVIDER:  Nolene Ebbs, MD  PRIMARY REASON FOR VISIT:  1. Malignant neoplasm of head of pancreas ([place])   2. Family history of bladder cancer   3. Family history of stomach cancer   4. Family history of throat cancer   5. Personal history of breast cancer      HISTORY OF PRESENT ILLNESS:   Julie Rollins, a 73 y.o. female, was seen for a [place] cancer genetics consultation at the request of Dr. [name] due to a personal and family history of cancer.  Julie Rollins presents to clinic today to discuss the possibility of a hereditary predisposition to cancer, genetic testing, and to further clarify her future cancer risks, as well as potential cancer risks for family members.   In 2000, at the age of 61, Julie Rollins was diagnosed with breast cancer. This was treated with lumpectomy and radiation.   In 2020, at the age of 19, Julie Rollins was diagnosed with pancreatic cancer. She has had chemotherapy, and potentially may have radiation and surgery.  CANCER HISTORY:  Oncology History  Malignant neoplasm of head of pancreas ([place])  03/28/2019 Imaging   CT AP w contrast IMPRESSION: Interval development of 20 x 14 mm ill-defined low density in pancreatic head concerning for possible neoplasm or malignancy. This appears to be resulting in severe intrahepatic and extrahepatic biliary dilatation. ERCP is recommended for further evaluation.  Sigmoid diverticulosis without inflammation.   03/29/2019 Initial Biopsy   Diagnosis FINE NEEDLE ASPIRATION, ENDOSCOPIC EGUS, PANCREATIC HEAD MASS (SPECIMEN 1 OF 2 COLLECTED 03/29/2019) MALIGNANT CELLS CONSISTENT WITH ADENOCARCINOMA.   03/29/2019 Initial Biopsy   Diagnosis Duodenum, Biopsy, abnormal mucosa at major papilla - ADENOCARCINOMA. SEE NOTE Diagnosis Note The duodenal mucosa is negative for dysplasia. The findings are consistent with duodenal involvement from  patient's suspected primary pancreatic adenocarcinoma. Dr. Melina Copa has reviewed this case and concurs with the above interpretation. Dr. Ardis Hughs was notified on 03/30/2019.   03/29/2019 Pathology Results   Diagnosis BILE DUCT BRUSHING (SPECIMEN 2 OF 2, COLLECTED ON 03/29/2019): HIGHLY SUSPICIOUS FOR MALIGNANCY.   03/29/2019 Procedure   ERCP with stenting of malignant distal CBD stricture per Dr. Ardis Hughs   04/04/2019 Initial Diagnosis   Malignant neoplasm of head of pancreas ([place])   04/08/2019 Imaging   CT Chest IMPRESSION: No evidence of metastatic disease in the chest. Aortic Atherosclerosis (ICD10-I70.0).    04/13/2019 - 05/10/2019 Chemotherapy   neoadjuvant FOLFIRINOX q3weeks beginning 04/13/19; dose-reduced with cycle 1     RISK FACTORS:  Menarche was at age 64.  First live birth at age no children.  Ovaries intact: no.  Hysterectomy: yes.  Menopausal status: postmenopausal.  HRT use: 0 years. Colonoscopy: yes; normal. Mammogram within the last year: yes.   Past Medical History:  Diagnosis Date  . Anxiety   . Blood transfusion without reported diagnosis   . Breast CA ([place])    s/p lumpectomy and radiation 2000  . Breast cancer ([place])   . Colitis    2007  . Family history of bladder cancer   . Family history of stomach cancer   . Family history of throat cancer   . Hernia, epigastric   . HTN (hypertension)    "mild hypertension", was on BP med years ago but it caused orthostatic hypotension and she has not been medicated since  . Hypothyroid   . Pancreatic cancer ([place]) dx'd 03/2019  . Personal history of  breast cancer   . Personal history of radiation therapy 2001  . Vertigo   . Vertigo     Past Surgical History:  Procedure Laterality Date  . ABDOMINAL HYSTERECTOMY     37yr ago  . ANTERIOR CERVICAL DECOMP/DISCECTOMY FUSION N/A 08/02/2018   Procedure: ANTERIOR CERVICAL DECOMPRESSION/DISCECTOMY FUSION CERVICAL FIVE- CERVICAL SIX;  Surgeon: PEarnie Larsson MD;  [facility]  Service: Neurosurgery;  Laterality: N/A;  ANTERIOR CERVICAL DECOMPRESSION/DISCECTOMY FUSION CERVICAL FIVE- CERVICAL SIX  . BILIARY BRUSHING  03/29/2019   Procedure: BILIARY BRUSHING;  Surgeon: JMilus Banister MD;  [facility];  Service: Endoscopy;;  . BILIARY STENT PLACEMENT  03/29/2019   Procedure: BILIARY STENT PLACEMENT;  Surgeon: JMilus Banister MD;  [facility];  Service: Endoscopy;;  . BIOPSY  03/29/2019   Procedure: BIOPSY;  Surgeon: JMilus Banister MD;  [facility];  Service: Endoscopy;;  . BREAST LUMPECTOMY     2000  . ERCP N/A 03/29/2019   Procedure: ENDOSCOPIC RETROGRADE CHOLANGIOPANCREATOGRAPHY (ERCP);  Surgeon: JMilus Banister MD;  [facility];  Service: Endoscopy;  Laterality: N/A;  . ESOPHAGOGASTRODUODENOSCOPY (EGD) WITH PROPOFOL N/A 03/29/2019   Procedure: ESOPHAGOGASTRODUODENOSCOPY (EGD) WITH PROPOFOL;  Surgeon: JMilus Banister MD;  [facility];  Service: Endoscopy;  Laterality: N/A;  . EUS N/A 03/29/2019   Procedure: UPPER ENDOSCOPIC ULTRASOUND (EUS) LINEAR;  Surgeon: JMilus Banister MD;  [facility];  Service: Endoscopy;  Laterality: N/A;  . FINE NEEDLE ASPIRATION  03/29/2019   Procedure: FINE NEEDLE ASPIRATION (FNA);  Surgeon: JMilus Banister MD;  [facility];  Service: Endoscopy;;  . HERNIA REPAIR     20+ years ago  . PORTACATH PLACEMENT N/A 04/12/2019   Procedure: INSERTION PORT-A-CATH LEFT SUBCLAVIAN;  Surgeon: BStark Klein MD;  [facility]  Service: General;  Laterality: N/A;  . RETINAL DETACHMENT SURGERY    . SPHINCTEROTOMY  03/29/2019   Procedure: SPHINCTEROTOMY;  Surgeon: JMilus Banister MD;  [facility];  Service: Endoscopy;;    Social History   Socioeconomic History  . Marital status: Divorced    Spouse name: Not on file  . Number of children: Not on file  . Years of education: Not on file  . Highest education level: Not on file   Occupational History  . Occupation: retired    Comment: [employer]  Social Needs  . Financial resource strain: Not on file  . Food insecurity    Worry: Not on file    Inability: Not on file  . Transportation needs    Medical: No    Non-medical: No  Tobacco Use  . Smoking status: Never Smoker  . Smokeless tobacco: Never Used  Substance and Sexual Activity  . Alcohol use: No  . Drug use: No  . Sexual activity: Not Currently  Lifestyle  . Physical activity    Days per week: Not on file    Minutes per session: Not on file  . Stress: Not on file  Relationships  . Social [place] phone: Not on file    Gets together: Not on file    Attends religious service: Not on file    Active member of club or organization: Not on file    Attends meetings of clubs or organizations: Not on file    Relationship status: Not on file  Other Topics Concern  . Not on file  Social History Narrative   Lives alone  FAMILY HISTORY:  We obtained a detailed, 4-generation family history.  Significant diagnoses are listed below: Family History  Problem Relation Age of Onset  . Bladder Cancer Brother   . Other Other        Denies family h/o cardiac disease  . Stomach cancer Maternal Uncle   . Throat cancer Maternal Uncle    Julie Rollins does not have children. She had one brother and one sister. Her brother died at 87 and had bladder cancer diagnosed in his 80s. Her sister died at 73 and had 5 children.   Julie Rollins mother died at 12 with no cancer history. Patient had 5 maternal uncles, 1 maternal aunt. She also notes her maternal grandfather had 7 other children. One of her maternal uncles had stomach cancer, she is unaware of the age at diagnosis or age of death. Another uncle had throat cancer.  She is unaware of cancers in maternal cousins but has limited information about them. Her maternal grandmother died in her 18s. Maternal grandfather had cancer but she is unsure of the  type, and he died in his 47s.  Julie Rollins father died at 33. She has very limited information about this side of the family. She knows she had 1 paternal uncle and 1 paternal aunt, both died in their 68s. She is unaware of any cancers on this side.  Julie Rollins is unaware of previous family history of genetic testing for hereditary cancer risks.There is no reported Ashkenazi Jewish ancestry. There Is no known consanguinity.  GENETIC COUNSELING ASSESSMENT: Julie Rollins is a 73 y.o. female with a personal history which is somewhat suggestive of a hereditary cancer syndrome and predisposition to cancer. We, therefore, discussed and recommended the following at today's visit.   DISCUSSION: We discussed that 5 - 10% of pancreatic cancer is hereditary, with most cases associated with BRCA1/BRCA2.  There are other genes that can be associated with hereditary pancreatic cancer syndromes. There are also genes associated with hereditary breast cancer syndromes, and sometimes breast and pancreatic cancer can be seen in the same hereditary cancer syndrome. We discussed that testing is beneficial for several reasons including potential treatments such as PARP inhibitors, knowing how to follow individuals after completing their treatment, and understand if other family members could be at risk for cancer and allow them to undergo genetic testing.   We reviewed the characteristics, features and inheritance patterns of hereditary cancer syndromes. We also discussed genetic testing, including the appropriate family members to test, the process of testing, insurance coverage and turn-around-time for results. We discussed the implications of a negative, positive and/or variant of uncertain significant result. We recommended Julie Rollins pursue genetic testing for the Common Hereditary Cancers gene panel.   The Common Hereditary Cancers Panel offered by [facility] includes sequencing and/or deletion duplication testing of the  following 48 genes: APC, ATM, AXIN2, BARD1, BMPR1A, BRCA1, BRCA2, BRIP1, CDH1, CDKN2A (p14ARF), CDKN2A (p16INK4a), CKD4, CHEK2, CTNNA1, DICER1, EPCAM (Deletion/duplication testing only), GREM1 (promoter region deletion/duplication testing only), KIT, MEN1, MLH1, MSH2, MSH3, MSH6, MUTYH, NBN, NF1, NHTL1, PALB2, PDGFRA, PMS2, POLD1, POLE, PTEN, RAD50, RAD51C, RAD51D, RNF43, SDHB, SDHC, SDHD, SMAD4, SMARCA4. STK11, TP53, TSC1, TSC2, and VHL.  The following genes were evaluated for sequence changes only: SDHA and HOXB13 c.251G>A variant only.  Based on Julie Rollins's personal history of cancer, she meets medical criteria for genetic testing. Despite that she meets criteria, she may still have an out of pocket cost.   PLAN: After considering the risks,  benefits, and limitations, Julie Rollins provided informed consent to pursue genetic testing and the blood sample was sent to [facility] for analysis of the Common Hereditary Cancers Panel. Results should be available within approximately 2-3 weeks' time, at which point they will be disclosed by telephone to Julie Rollins, as will any additional recommendations warranted by these results. Julie Rollins will receive a summary of her genetic counseling visit and a copy of her results once available. This information will also be available in Epic.   Lastly, we encouraged Ms. Haros to remain in contact with cancer genetics annually so that we can continuously update the family history and inform her of any changes in cancer genetics and testing that may be of benefit for this family.   Julie Rollins questions were answered to her satisfaction today. Our contact information was provided should additional questions or concerns arise. Thank you for the referral and allowing [place] to share in the care of your patient.   Faith Rogue, MS Genetic Counselor [email or web address] Phone: [phone]  The patient was seen for a total of 25 minutes in face-to-face  genetic counseling.  Drs. Magrinat, Lindi Adie and/or [name] were available for discussion regarding this case.   _______________________________________________________________________ For Office Staff:  Number of people involved in session: 1 Was an Intern/ student involved with case: no

## 2019-06-03 ENCOUNTER — Telehealth: Payer: Self-pay | Admitting: Hematology

## 2019-06-03 NOTE — Telephone Encounter (Signed)
Called patient and rescheduled her appt per MD request.  Patient aware of her new appt date and time.

## 2019-06-06 ENCOUNTER — Other Ambulatory Visit (HOSPITAL_COMMUNITY): Payer: Medicare HMO

## 2019-06-06 ENCOUNTER — Telehealth: Payer: Self-pay | Admitting: Gastroenterology

## 2019-06-06 NOTE — Telephone Encounter (Signed)
The pt spoke with [facility] and she was advised to have COVID testing tomorrow.  She states she will go early in the morning

## 2019-06-06 NOTE — Telephone Encounter (Signed)
Pt is scheduled for a procedure at [facility] and stated that she has not received her COVID-19 testing instructions.  Pt requested a call back in the afternoon as she has MD appts in the morning.

## 2019-06-06 NOTE — Telephone Encounter (Signed)
No answer no voice mail  

## 2019-06-07 ENCOUNTER — Other Ambulatory Visit (HOSPITAL_COMMUNITY)
Admission: RE | Admit: 2019-06-07 | Discharge: 2019-06-07 | Disposition: A | Discharge status: Home / Self Care | Payer: Medicare HMO | Source: Ambulatory Visit | Attending: Gastroenterology | Admitting: Gastroenterology

## 2019-06-07 DIAGNOSIS — Z1159 Encounter for screening for other viral diseases: Secondary | ICD-10-CM | POA: Insufficient documentation

## 2019-06-07 LAB — SARS CORONAVIRUS 2 (TAT 6-24 HRS): SARS Coronavirus 2: NEGATIVE

## 2019-06-08 ENCOUNTER — Encounter: Payer: Self-pay | Admitting: Licensed Clinical Social Worker

## 2019-06-08 ENCOUNTER — Other Ambulatory Visit: Payer: Self-pay

## 2019-06-08 ENCOUNTER — Telehealth: Payer: Self-pay | Admitting: Licensed Clinical Social Worker

## 2019-06-08 ENCOUNTER — Ambulatory Visit: Payer: Self-pay | Admitting: Licensed Clinical Social Worker

## 2019-06-08 ENCOUNTER — Telehealth: Payer: Self-pay | Admitting: Internal Medicine

## 2019-06-08 ENCOUNTER — Encounter (HOSPITAL_COMMUNITY): Payer: Self-pay | Admitting: *Deleted

## 2019-06-08 DIAGNOSIS — Z8052 Family history of malignant neoplasm of bladder: Secondary | ICD-10-CM

## 2019-06-08 DIAGNOSIS — C250 Malignant neoplasm of head of pancreas: Secondary | ICD-10-CM

## 2019-06-08 DIAGNOSIS — Z853 Personal history of malignant neoplasm of breast: Secondary | ICD-10-CM

## 2019-06-08 DIAGNOSIS — Z800 Family history of malignant neoplasm of digestive organs: Secondary | ICD-10-CM

## 2019-06-08 DIAGNOSIS — Z1379 Encounter for other screening for genetic and chromosomal anomalies: Secondary | ICD-10-CM | POA: Insufficient documentation

## 2019-06-08 DIAGNOSIS — Z1501 Genetic susceptibility to malignant neoplasm of breast: Secondary | ICD-10-CM

## 2019-06-08 DIAGNOSIS — Z1509 Genetic susceptibility to other malignant neoplasm: Secondary | ICD-10-CM

## 2019-06-08 NOTE — Telephone Encounter (Signed)
Called pt to set up her transportation services for 7/22

## 2019-06-08 NOTE — Progress Notes (Signed)
sw patient, arrival time 0945 for 06/09/2019 procedure. Pt has quarantined with no symptoms, verb und of NPO after midnight.  Answered questions regarding medications.

## 2019-06-08 NOTE — Telephone Encounter (Signed)
Revealed BRCA2 mutation identified. Discussed BRCA2 in detail including cancers associated, management options, and the importance of letting family members know about this result.

## 2019-06-08 NOTE — Progress Notes (Addendum)
Genetic Test Results  HPI:  Julie Rollins was previously seen in the [place] clinic due to a personal and family history of cancer and concerns regarding a hereditary predisposition to cancer. Please refer to our prior cancer genetics clinic note for more information regarding our discussion, assessment and recommendations, at the time. Julie Rollins recent genetic test results were disclosed to her, as were recommendations warranted by these results. These results and recommendations are discussed in more detail below.  CANCER HISTORY:  Oncology History  Malignant neoplasm of head of pancreas ([place])  03/28/2019 Imaging   CT AP w contrast IMPRESSION: Interval development of 20 x 14 mm ill-defined low density in pancreatic head concerning for possible neoplasm or malignancy. This appears to be resulting in severe intrahepatic and extrahepatic biliary dilatation. ERCP is recommended for further evaluation.  Sigmoid diverticulosis without inflammation.   03/29/2019 Initial Biopsy   Diagnosis FINE NEEDLE ASPIRATION, ENDOSCOPIC EGUS, PANCREATIC HEAD MASS (SPECIMEN 1 OF 2 COLLECTED 03/29/2019) MALIGNANT CELLS CONSISTENT WITH ADENOCARCINOMA.   03/29/2019 Initial Biopsy   Diagnosis Duodenum, Biopsy, abnormal mucosa at major papilla - ADENOCARCINOMA. SEE NOTE Diagnosis Note The duodenal mucosa is negative for dysplasia. The findings are consistent with duodenal involvement from patient's suspected primary pancreatic adenocarcinoma. Dr. Melina Copa has reviewed this case and concurs with the above interpretation. Dr. Ardis Hughs was notified on 03/30/2019.   03/29/2019 Pathology Results   Diagnosis BILE DUCT BRUSHING (SPECIMEN 2 OF 2, COLLECTED ON 03/29/2019): HIGHLY SUSPICIOUS FOR MALIGNANCY.   03/29/2019 Procedure   ERCP with stenting of malignant distal CBD stricture per Dr. Ardis Hughs   04/04/2019 Initial Diagnosis   Malignant neoplasm of head of pancreas ([place])   04/08/2019 Imaging   CT Chest  IMPRESSION: No evidence of metastatic disease in the chest. Aortic Atherosclerosis (ICD10-I70.0).    04/13/2019 - 05/10/2019 Chemotherapy   neoadjuvant FOLFIRINOX q3weeks beginning 04/13/19; dose-reduced with cycle 1   06/08/2019 Genetic Testing   Positive genetic testing. 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 The Common Hereditary Cancers Panel offered by [facility] includes sequencing and/or deletion duplication testing of the following 48 genes: APC, ATM, AXIN2, BARD1, BMPR1A, BRCA1, BRCA2, BRIP1, CDH1, CDKN2A (p14ARF), CDKN2A (p16INK4a), CKD4, CHEK2, CTNNA1, DICER1, EPCAM (Deletion/duplication testing only), GREM1 (promoter region deletion/duplication testing only), KIT, MEN1, MLH1, MSH2, MSH3, MSH6, MUTYH, NBN, NF1, NHTL1, PALB2, PDGFRA, PMS2, POLD1, POLE, PTEN, RAD50, RAD51C, RAD51D, RNF43, SDHB, SDHC, SDHD, SMAD4, SMARCA4. STK11, TP53, TSC1, TSC2, and VHL.  The following genes were evaluated for sequence changes only: SDHA and HOXB13 c.251G>A variant only. The report date is 06/08/2019.     FAMILY HISTORY:  We obtained a detailed, 4-generation family history.  Significant diagnoses are listed below: Family History  Problem Relation Age of Onset   Bladder Cancer Brother    Other Other        Denies family h/o cardiac disease   Stomach cancer Maternal Uncle    Throat cancer Maternal Uncle     Julie Rollins does not have children. She had one brother and one sister. Her brother died at 41 and had bladder cancer diagnosed in his 85s. Her sister died at 8 and had 5 children.   Julie Rollins mother died at 78 with no cancer history. Patient had 5 maternal uncles, 1 maternal aunt. She also notes her maternal grandfather had 7 other children. One of her maternal uncles had stomach cancer, she is unaware of the age at diagnosis or age of  death. Another uncle had throat cancer.  She is unaware of cancers in maternal  cousins but has limited information about them. Her maternal grandmother died in her 80s. Maternal grandfather had cancer but she is unsure of the type, and he died in his 17s.  Julie Rollins father died at 17. She has very limited information about this side of the family. She knows she had 1 paternal uncle and 1 paternal aunt, both died in their 3s. She is unaware of any cancers on this side.  Julie Rollins is unaware of previous family history of genetic testing for hereditary cancer risks.There is no reported Ashkenazi Jewish ancestry. There Is no known consanguinity.  GENETIC TEST RESULTS: Genetic testing reported out on 06/08/2019 through the [facility] Common Hereditary cancer panel identified a single, pathogenic variant in BRCA2 called c.1929del (p.Arg645Glufs*15).   The test report has been scanned into EPIC and is located under the Molecular Pathology section of the Results Review tab.  A portion of the result report is included below for reference.      DISCUSSION: BRCA2   We discussed the cancers, inheritance, management asscociated with BRCA2 and the importance of telling family members about this result.   HBOC syndrome is characterized by an increased lifetime risk for generally adult-onset cancers including breast, contralateral breast, female breast, ovarian, prostate and pancreatic.   We discussed that this result explains her personal history of breast and pancreatic cancer.   Cancers associated with BRCA2:  -Breast cancer, up to a 84% risk (PMID: 3419622 ) -Ovarian cancer, up to a 27% risk (PMID: 2979892) -Pancreatic cancer, 2-7% (PMID: 11941740, 81448185, 63149702, 63785885) -Prostate cancer, elevated (02774128, 78676720) -Melanoma, elevated (PMID: 94709628, 36629476)  Inheritance Hereditary predisposition to cancer due to pathogenic variants in the BRCA2 gene has autosomal dominant inheritance. This means that an individual with a pathogenic variant has a 50% chance of  passing the condition on to his/her offspring. Most cases are inherited from a parent, but some cases may occur spontaneously (i.e., an individual with a pathogenic variant has parents who do not have it). Identification of a pathogenic variant allows for the recognition of at-risk relatives who can pursue testing for the familial variant.  Individuals with a single pathogenic BRCA2 variant are also carriers of autosomal recessive Fanconi anemia. Fanconi anemia is characterized by bone marrow failure with variable additional anomalies, which often include short stature, abnormal skin pigmentation, abnormal thumbs, malformations of the skeletal and central nervous systems, and developmental delay (PMID: 5465035, 46568127). Risk of leukemia and early-onset solid tumors is significantly elevated with this disorder (PMID: 51700174, 94496759, 16384665). For there to be a risk of Fanconi anemia in offspring, both the patient and their partner would each have to carry a pathogenic variant in BRCA2; in this case, the risk to have an affected child is 25%.   Management:    Breast cancer -clinical breast exams every 6-12 months beginning at 13 -age 6-29: annual breast MRI screening with contrast -age 32-75: annual mammogram with consideration of tomosynthesis and breast MRI screening with contrast -For women treated for breast cancer, screening of remaining breast tissue with annual mammography and breast MRI should continue (if they have not had bilateral mastectomy) -Consider option of risk-reducing mastectomy  We discussed that Julie Rollins could consider having a breast MRI in addition to mammograms and to discuss her options with her doctor.    Ovarian cancer -Recommend risk-reducing salpingo-oopherectomy (RRSO) typically between the ages of 79-40 and upon completion of childbearing -  For patients who do not elect RRSO, transvaginal ultrasound combined with serum CA-125 for ovarian cancer screening has not  been shown to be sufficiently sensitive or specific as to support a positive recommendation, but, although of uncertain benefit, may be considered at clinician's discretion starting at age 74-35 years  Julie Rollins has already had her ovaries out and therefore has reduced her risk for ovarian cancer as much as possible.   Pancreatic cancer -For individuals with a pathogenic/likely pathogenic variant in one of the pancreatic cancer susceptibility genes: -Consider pancreatic cancer screening beginning at age 53 (or 44 years younger than earliest exocrine pancreatic cancer diagnosis in the family, whichever is earlier), for individuals with exocrine pancreatic cancer in 1 or more first or second degree relatives from the same side of (or presumed to be from same side of) family as the identified pathogenic/likely pathogenic variant -The panel does not currently recommend pancreatic cancer screening for carrier of mutations in genes other than STK11 and CDKN2A in the absence of a close family history of exocrine pancreatic cancer   We discussed that this result could open the opportunity for PARP inhibitors as a treatment for her current pancreatic cancer.  Melanoma -There are no specific NCCN guidelines regarding melanoma screening for individuals with BRCA mutations.  -However, sun protection is recommended, and routine skin exams by a dermatologist can be considered.     These guidelines are based on current NCCN guidelines (NCCN v.1.2020).  These guidelines are subject to change and continually updated and should be directly referenced for future medical management.       FAMILY MEMBERS: It is important that all of Julie Rollins's relatives (both men and women) know of the presence of this gene mutation. Site-specific genetic testing can sort out who in the family is at risk and who is not. Her brother, sister, and parents are deceased, and she does not have children. She reports not being close with  her other family members and that they do not live in the area. We stressed the importance of letting her nephews and cousins know about this result. We recommend they have genetic testing for this same mutation, as identifying the presence of this mutation would allow them to also take advantage of risk-reducing measures.   PLAN:   1. These results will be made available to her referring provider, Dr. [name]. She would like Dr. [name] to follow her long-term for this indication.   2. Julie Rollins plans to discuss these results with her family and will reach out to [place] if we can be of any assistance in coordinating genetic testing for any of her relatives.    SUPPORT AND RESOURCES: If Julie Rollins is interested in BRCA-specific information and support, there are two groups, Facing Our Risk (www.facingourrisk.com) and Bright Pink (www.brightpink.org) which some people have found useful. They provide opportunities to speak with other individuals from high-risk families. To locate genetic counselors in other cities, visit the website of the [employer] (ArtistMovie.se) and Secretary/administrator for a Social worker by zip code.  We encouraged Julie Rollins to remain in contact with [place] on an annual basis so we can update her personal and family histories, and let her know of advances in cancer genetics that may benefit the family. Our contact number was provided. Julie Rollins questions were answered to her satisfaction today, and she knows she is welcome to call anytime with additional questions.   Faith Rogue, MS Genetic Counselor [email or web address] Phone: [phone]

## 2019-06-09 ENCOUNTER — Encounter (HOSPITAL_COMMUNITY): Payer: Self-pay | Admitting: *Deleted

## 2019-06-09 ENCOUNTER — Encounter (HOSPITAL_COMMUNITY)
Admission: RE | Disposition: A | Discharge status: Home / Self Care | Payer: Self-pay | Source: Other Acute Inpatient Hospital | Attending: Gastroenterology

## 2019-06-09 ENCOUNTER — Ambulatory Visit (HOSPITAL_COMMUNITY)
Admission: RE | Admit: 2019-06-09 | Discharge: 2019-06-09 | Disposition: A | Discharge status: Home / Self Care | Payer: Medicare HMO | Source: Other Acute Inpatient Hospital | Attending: Gastroenterology | Admitting: Gastroenterology

## 2019-06-09 ENCOUNTER — Ambulatory Visit (HOSPITAL_COMMUNITY): Payer: Medicare HMO | Admitting: Anesthesiology

## 2019-06-09 DIAGNOSIS — Z8052 Family history of malignant neoplasm of bladder: Secondary | ICD-10-CM | POA: Insufficient documentation

## 2019-06-09 DIAGNOSIS — Z853 Personal history of malignant neoplasm of breast: Secondary | ICD-10-CM | POA: Insufficient documentation

## 2019-06-09 DIAGNOSIS — Z79899 Other long term (current) drug therapy: Secondary | ICD-10-CM | POA: Diagnosis not present

## 2019-06-09 DIAGNOSIS — E039 Hypothyroidism, unspecified: Secondary | ICD-10-CM | POA: Diagnosis not present

## 2019-06-09 DIAGNOSIS — R740 Nonspecific elevation of levels of transaminase and lactic acid dehydrogenase [LDH]: Secondary | ICD-10-CM | POA: Insufficient documentation

## 2019-06-09 DIAGNOSIS — F419 Anxiety disorder, unspecified: Secondary | ICD-10-CM | POA: Diagnosis not present

## 2019-06-09 DIAGNOSIS — Z923 Personal history of irradiation: Secondary | ICD-10-CM | POA: Diagnosis not present

## 2019-06-09 DIAGNOSIS — Z885 Allergy status to narcotic agent status: Secondary | ICD-10-CM | POA: Diagnosis not present

## 2019-06-09 DIAGNOSIS — C784 Secondary malignant neoplasm of small intestine: Secondary | ICD-10-CM | POA: Diagnosis not present

## 2019-06-09 DIAGNOSIS — Z79890 Hormone replacement therapy (postmenopausal): Secondary | ICD-10-CM | POA: Insufficient documentation

## 2019-06-09 DIAGNOSIS — I10 Essential (primary) hypertension: Secondary | ICD-10-CM | POA: Diagnosis not present

## 2019-06-09 DIAGNOSIS — C250 Malignant neoplasm of head of pancreas: Secondary | ICD-10-CM | POA: Diagnosis present

## 2019-06-09 HISTORY — PX: EUS: SHX5427

## 2019-06-09 HISTORY — PX: FIDUCIAL MARKER PLACEMENT: SHX6858

## 2019-06-09 HISTORY — DX: Cardiac arrhythmia, unspecified: I49.9

## 2019-06-09 HISTORY — PX: ESOPHAGOGASTRODUODENOSCOPY (EGD) WITH PROPOFOL: SHX5813

## 2019-06-09 HISTORY — DX: Dyspnea, unspecified: R06.00

## 2019-06-09 SURGERY — ESOPHAGOGASTRODUODENOSCOPY (EGD) WITH PROPOFOL
Anesthesia: Monitor Anesthesia Care

## 2019-06-09 MED ORDER — LIDOCAINE 2% (20 MG/ML) 5 ML SYRINGE
INTRAMUSCULAR | Status: DC | PRN
  Administered 2019-06-09: 80 mg via INTRAVENOUS

## 2019-06-09 MED ORDER — LACTATED RINGERS IV SOLN
INTRAVENOUS | Status: AC | PRN
  Administered 2019-06-09: 1000 mL via INTRAVENOUS

## 2019-06-09 MED ORDER — PROPOFOL 10 MG/ML IV BOLUS
INTRAVENOUS | Status: AC
  Filled 2019-06-09: qty 20

## 2019-06-09 MED ORDER — PROPOFOL 500 MG/50ML IV EMUL
INTRAVENOUS | Status: DC | PRN
  Administered 2019-06-09: 75 ug/kg/min via INTRAVENOUS

## 2019-06-09 MED ORDER — SODIUM CHLORIDE 0.9 % IV SOLN: INTRAVENOUS | Status: DC

## 2019-06-09 MED ORDER — HEPARIN SOD (PORK) LOCK FLUSH 100 UNIT/ML IV SOLN
500.0000 [IU] | INTRAVENOUS | Status: AC | PRN
  Administered 2019-06-09: 500 [IU]

## 2019-06-09 MED ORDER — PROPOFOL 10 MG/ML IV BOLUS
INTRAVENOUS | Status: DC | PRN
  Administered 2019-06-09 (×2): 20 mg via INTRAVENOUS

## 2019-06-09 SURGICAL SUPPLY — 15 items

## 2019-06-09 NOTE — Interval H&P Note (Signed)
History and Physical Interval Note:  06/09/2019 10:57 AM  Julie Rollins  has presented today for surgery, with the diagnosis of Pancreatic head mass.  The various methods of treatment have been discussed with the patient and family. After consideration of risks, benefits and other options for treatment, the patient has consented to  Procedure(s) with comments: ESOPHAGOGASTRODUODENOSCOPY (EGD) WITH PROPOFOL (N/A) UPPER ENDOSCOPIC ULTRASOUND (EUS) LINEAR (N/A) - NEED FIDUCIAL PLACEMENT FOR PROCEDURE as a surgical intervention.  The patient's history has been reviewed, patient examined, no change in status, stable for surgery.  I have reviewed the patient's chart and labs.  Questions were answered to the patient's satisfaction.     [name]

## 2019-06-09 NOTE — Transfer of Care (Signed)
Immediate Anesthesia Transfer of Care Note  Patient: Julie Rollins  Procedure(s) Performed: ESOPHAGOGASTRODUODENOSCOPY (EGD) WITH PROPOFOL (N/A ) UPPER ENDOSCOPIC ULTRASOUND (EUS) LINEAR (N/A )  Patient Location: Endoscopy Unit  Anesthesia Type:MAC  Level of Consciousness: awake, alert , oriented and patient cooperative  Airway & Oxygen Therapy: Patient Spontanous Breathing and Patient connected to nasal cannula oxygen  Post-op Assessment: Report given to RN, Post -op Vital signs reviewed and stable and Patient moving all extremities  Post vital signs: Reviewed and stable  Last Vitals:  Vitals Value Taken Time  BP    Temp    Pulse    Resp    SpO2      Last Pain:  Vitals:   06/09/19 1002  TempSrc: Temporal  PainSc: 0-No pain         Complications: No apparent anesthesia complications

## 2019-06-09 NOTE — Anesthesia Preprocedure Evaluation (Signed)
Anesthesia Evaluation  Patient identified by MRN, date of birth, ID band Patient awake    Reviewed: Allergy & Precautions, H&P , NPO status , Patient's Chart, lab work & pertinent test results  Airway Mallampati: II   Neck ROM: full    Dental   Pulmonary shortness of breath,    breath sounds clear to auscultation       Cardiovascular hypertension, + dysrhythmias  Rhythm:regular Rate:Normal     Neuro/Psych PSYCHIATRIC DISORDERS Anxiety    GI/Hepatic Pancreatic CA   Endo/Other  Hypothyroidism   Renal/GU      Musculoskeletal   Abdominal   Peds  Hematology   Anesthesia Other Findings   Reproductive/Obstetrics H/o breast CA                             Anesthesia Physical Anesthesia Plan  ASA: III  Anesthesia Plan: MAC   Post-op Pain Management:    Induction: Intravenous  PONV Risk Score and Plan: 2 and Propofol infusion and Treatment may vary due to age or medical condition  Airway Management Planned: Nasal Cannula  Additional Equipment:   Intra-op Plan:   Post-operative Plan:   Informed Consent: I have reviewed the patients History and Physical, chart, labs and discussed the procedure including the risks, benefits and alternatives for the proposed anesthesia with the patient or authorized representative who has indicated his/her understanding and acceptance.       Plan Discussed with: CRNA, Anesthesiologist and Surgeon  Anesthesia Plan Comments:         Anesthesia Quick Evaluation

## 2019-06-09 NOTE — Op Note (Signed)
[facility] Patient Name: Julie Rollins Procedure Date: 06/09/2019 MRN: [id number] Attending MD: [name] , MD Date of Birth: 1946-01-27 CSN: [id number] Age: 73 Admit Type: Outpatient Procedure:                Upper EUS Indications:              advanced pancreatic adenocarcinoma (s/o EUS and                            ERCP with metal biliary stent placement 03/2019) Providers:                [name], MD, Cleda Daub, RN, William Dalton, Technician Referring MD:              Medicines:                Monitored Anesthesia Care Complications:            No immediate complications. Estimated blood loss:                            None. Estimated Blood Loss:     Estimated blood loss: none. Procedure:                Pre-Anesthesia Assessment:                           - Prior to the procedure, a History and Physical                            was performed, and patient medications and                            allergies were reviewed. The patient's tolerance of                            previous anesthesia was also reviewed. The risks                            and benefits of the procedure and the sedation                            options and risks were discussed with the patient.                            All questions were answered, and informed consent                            was obtained. Prior Anticoagulants: The patient has                            taken no previous anticoagulant or antiplatelet                            agents.  ASA Grade Assessment: II - A patient with                            mild systemic disease. After reviewing the risks                            and benefits, the patient was deemed in                            satisfactory condition to undergo the procedure.                           After obtaining informed consent, the endoscope was                            passed under direct  vision. Throughout the                            procedure, the patient's blood pressure, pulse, and                            oxygen saturations were monitored continuously. The                            [id number] ([id number]) Olympus Linear EUS was                            introduced through the mouth, and advanced to the                            duodenal bulb. The upper EUS was accomplished                            without difficulty. The patient tolerated the                            procedure well. Scope In: Scope Out: Findings:      Endoscopic findings:      1. Small amount of liquid retained fluid in the stomach      2. There was significant edema and incomplete luminal narrowing in the       distal duodenal bulb. I was unable to advance the echoendoscope more       distal than the duodenal bulb      ENDOSONOGRAPHIC FINDING (limited examination for fiducial placement): :      1. Round, irregularly bordered hypoechoic mass in the pancreatic head.       The previously placed metal biliary stent passes directly adjacent to       the pancreatic mass, within the CBD. Using [facility] system, I       placed 4 pre-loaded fiducial markers into the pancreatic mass via       transduodenal approach. Impression:               - 4 fiducial markers were placed into the  pancreatic head mass via transduodenal EUS approach.                           - Duodenal edema and luminal narrowing in the                            distal duodenal bulb, likely from adjacent                            pancreatic mass. This is causing partial gastric                            outlet obstruction. Moderate Sedation:      Not Applicable - Patient had care per Anesthesia. Recommendation:           - Discharge patient to home (ambulatory). Procedure Code(s):        --- Professional ---                           [id number], Esophagogastroduodenoscopy, flexible,                             transoral; with transendoscopic ultrasound-guided                            transmural injection of diagnostic or therapeutic                            substance(s) (eg, anesthetic, neurolytic agent) or                            fiducial marker(s) (includes endoscopic ultrasound                            examination of the esophagus, stomach, and either                            the duodenum or a surgically altered stomach where                            the jejunum is examined distal to the anastomosis) Diagnosis Code(s):        --- Professional ---                           K86.89, Other specified diseases of pancreas                           K22.8, Other specified diseases of esophagus CPT copyright 2019 American Medical Association. All rights reserved. The codes documented in this report are preliminary and upon coder review may  be revised to meet current compliance requirements. [name], MD 06/09/2019 11:57:49 AM This report has been signed electronically. Number of Addenda: 0

## 2019-06-09 NOTE — Discharge Instructions (Signed)
YOU HAD AN ENDOSCOPIC PROCEDURE TODAY: Refer to the procedure report and other information in the discharge instructions given to you for any specific questions about what was found during the examination. If this information does not answer your questions, please call [place] office at [phone] to clarify.   YOU SHOULD EXPECT: Some feelings of bloating in the abdomen. Passage of more gas than usual. Walking can help get rid of the air that was put into your GI tract during the procedure and reduce the bloating. If you had a lower endoscopy (such as a colonoscopy or flexible sigmoidoscopy) you may notice spotting of blood in your stool or on the toilet paper. Some abdominal soreness may be present for a day or two, also.  DIET: Your first meal following the procedure should be a light meal and then it is ok to progress to your normal diet. A half-sandwich or bowl of soup is an example of a good first meal. Heavy or fried foods are harder to digest and may make you feel nauseous or bloated. Drink plenty of fluids but you should avoid alcoholic beverages for 24 hours. If you had a esophageal dilation, please see attached instructions for diet.    ACTIVITY: Your care partner should take you home directly after the procedure. You should plan to take it easy, moving slowly for the rest of the day. You can resume normal activity the day after the procedure however YOU SHOULD NOT DRIVE, use power tools, machinery or perform tasks that involve climbing or major physical exertion for 24 hours (because of the sedation medicines used during the test).   SYMPTOMS TO REPORT IMMEDIATELY: A gastroenterologist can be reached at any hour. Please call [phone]  for any of the following symptoms:   Following upper endoscopy (EGD, EUS, ERCP, esophageal dilation) Vomiting of blood or coffee ground material  New, significant abdominal pain  New, significant chest pain or pain under the shoulder blades  Painful or  persistently difficult swallowing  New shortness of breath  Black, tarry-looking or red, bloody stools  FOLLOW UP:  If any biopsies were taken you will be contacted by phone or by letter within the next 1-3 weeks. Call [phone]  if you have not heard about the biopsies in 3 weeks.  Please also call with any specific questions about appointments or follow up tests.  

## 2019-06-10 ENCOUNTER — Other Ambulatory Visit: Payer: Medicare HMO

## 2019-06-10 ENCOUNTER — Telehealth: Payer: Self-pay | Admitting: Hematology

## 2019-06-10 ENCOUNTER — Encounter (HOSPITAL_COMMUNITY): Payer: Self-pay | Admitting: Gastroenterology

## 2019-06-10 ENCOUNTER — Ambulatory Visit: Payer: Medicare HMO | Admitting: Hematology

## 2019-06-10 ENCOUNTER — Ambulatory Visit: Payer: Medicare HMO

## 2019-06-10 ENCOUNTER — Other Ambulatory Visit: Payer: Self-pay | Admitting: Nurse Practitioner

## 2019-06-10 NOTE — Anesthesia Postprocedure Evaluation (Signed)
Anesthesia Post Note  Patient: Julie Rollins  Procedure(s) Performed: ESOPHAGOGASTRODUODENOSCOPY (EGD) WITH PROPOFOL (N/A ) UPPER ENDOSCOPIC ULTRASOUND (EUS) LINEAR (N/A )     Patient location during evaluation: Endoscopy Anesthesia Type: MAC Level of consciousness: awake and alert Pain management: pain level controlled Vital Signs Assessment: post-procedure vital signs reviewed and stable Respiratory status: spontaneous breathing, nonlabored ventilation, respiratory function stable and patient connected to nasal cannula oxygen Cardiovascular status: blood pressure returned to baseline and stable Postop Assessment: no apparent nausea or vomiting Anesthetic complications: no    Last Vitals:  Vitals:   06/09/19 1220 06/09/19 1230  BP: 136/63 133/64  Pulse: 79 83  Resp: (!) 24 15  Temp:    SpO2: 96% 96%    Last Pain:  Vitals:   06/09/19 1210  TempSrc:   PainSc: 0-No pain   Pain Goal:                   Exodus Kutzer S

## 2019-06-10 NOTE — Telephone Encounter (Signed)
LB out 7/20 f/u moved from LB to LT and associated appointments adjusted.  Confirmed with patient. Message to transportation re change.

## 2019-06-12 ENCOUNTER — Telehealth: Payer: Self-pay | Admitting: Nurse Practitioner

## 2019-06-12 ENCOUNTER — Other Ambulatory Visit: Payer: Self-pay

## 2019-06-12 ENCOUNTER — Emergency Department (HOSPITAL_COMMUNITY)
Admission: EM | Admit: 2019-06-12 | Discharge: 2019-06-12 | Disposition: A | Discharge status: Home / Self Care | Payer: Medicare HMO | Source: Home / Self Care | Attending: Emergency Medicine | Admitting: Emergency Medicine

## 2019-06-12 DIAGNOSIS — Z79899 Other long term (current) drug therapy: Secondary | ICD-10-CM | POA: Insufficient documentation

## 2019-06-12 DIAGNOSIS — K315 Obstruction of duodenum: Secondary | ICD-10-CM | POA: Diagnosis not present

## 2019-06-12 DIAGNOSIS — C259 Malignant neoplasm of pancreas, unspecified: Secondary | ICD-10-CM

## 2019-06-12 DIAGNOSIS — I10 Essential (primary) hypertension: Secondary | ICD-10-CM | POA: Insufficient documentation

## 2019-06-12 DIAGNOSIS — E039 Hypothyroidism, unspecified: Secondary | ICD-10-CM | POA: Insufficient documentation

## 2019-06-12 DIAGNOSIS — C250 Malignant neoplasm of head of pancreas: Secondary | ICD-10-CM | POA: Diagnosis not present

## 2019-06-12 DIAGNOSIS — R112 Nausea with vomiting, unspecified: Secondary | ICD-10-CM | POA: Insufficient documentation

## 2019-06-12 DIAGNOSIS — R748 Abnormal levels of other serum enzymes: Secondary | ICD-10-CM | POA: Insufficient documentation

## 2019-06-12 LAB — CBC WITH DIFFERENTIAL/PLATELET
Abs Immature Granulocytes: 0.01 10*3/uL (ref 0.00–0.07)
Basophils Absolute: 0.1 10*3/uL (ref 0.0–0.1)
Basophils Relative: 1 %
Eosinophils Absolute: 0.2 10*3/uL (ref 0.0–0.5)
Eosinophils Relative: 3 %
HCT: 41.5 % (ref 36.0–46.0)
Hemoglobin: 13.3 g/dL (ref 12.0–15.0)
Immature Granulocytes: 0 %
Lymphocytes Relative: 11 %
Lymphs Abs: 0.8 10*3/uL (ref 0.7–4.0)
MCH: 31.2 pg (ref 26.0–34.0)
MCHC: 32 g/dL (ref 30.0–36.0)
MCV: 97.4 fL (ref 80.0–100.0)
Monocytes Absolute: 0.7 10*3/uL (ref 0.1–1.0)
Monocytes Relative: 9 %
Neutro Abs: 5.6 10*3/uL (ref 1.7–7.7)
Neutrophils Relative %: 76 %
Platelets: 311 10*3/uL (ref 150–400)
RBC: 4.26 MIL/uL (ref 3.87–5.11)
RDW: 13.7 % (ref 11.5–15.5)
WBC: 7.4 10*3/uL (ref 4.0–10.5)
nRBC: 0 % (ref 0.0–0.2)

## 2019-06-12 LAB — COMPREHENSIVE METABOLIC PANEL
ALT: 28 U/L (ref 0–44)
AST: 25 U/L (ref 15–41)
Albumin: 3.6 g/dL (ref 3.5–5.0)
Alkaline Phosphatase: 136 U/L — ABNORMAL HIGH (ref 38–126)
Anion gap: 12 (ref 5–15)
BUN: 13 mg/dL (ref 8–23)
CO2: 29 mmol/L (ref 22–32)
Calcium: 9.5 mg/dL (ref 8.9–10.3)
Chloride: 100 mmol/L (ref 98–111)
Creatinine, Ser: 0.63 mg/dL (ref 0.44–1.00)
GFR calc Af Amer: >60 mL/min (ref >60)
GFR calc non Af Amer: >60 mL/min (ref >60)
Glucose, Bld: 124 mg/dL — ABNORMAL HIGH (ref 70–99)
Potassium: 3.4 mmol/L — ABNORMAL LOW (ref 3.5–5.1)
Sodium: 141 mmol/L (ref 135–145)
Total Bilirubin: 0.7 mg/dL (ref 0.3–1.2)
Total Protein: 6.9 g/dL (ref 6.5–8.1)

## 2019-06-12 LAB — LIPASE, BLOOD: Lipase: 128 U/L — ABNORMAL HIGH (ref 11–51)

## 2019-06-12 MED ORDER — PROCHLORPERAZINE 25 MG RE SUPP: 25.0000 mg | Freq: Two times a day (BID) | RECTAL | 0 refills | Status: DC | PRN

## 2019-06-12 MED ORDER — ONDANSETRON HCL 4 MG/2ML IJ SOLN
4.0000 mg | Freq: Once | INTRAMUSCULAR | Status: AC
  Administered 2019-06-12: 05:00:00 4 mg via INTRAVENOUS
  Filled 2019-06-12: qty 2

## 2019-06-12 MED ORDER — ONDANSETRON 8 MG PO TBDP: 8.0000 mg | ORAL_TABLET | Freq: Three times a day (TID) | ORAL | 0 refills | Status: DC | PRN

## 2019-06-12 MED ORDER — HEPARIN SOD (PORK) LOCK FLUSH 100 UNIT/ML IV SOLN
500.0000 [IU] | Freq: Once | INTRAVENOUS | Status: AC
  Administered 2019-06-12: 500 [IU]
  Filled 2019-06-12: qty 5

## 2019-06-12 MED ORDER — SODIUM CHLORIDE 0.9 % IV BOLUS
1000.0000 mL | Freq: Once | INTRAVENOUS | Status: AC
  Administered 2019-06-12: 1000 mL via INTRAVENOUS

## 2019-06-12 NOTE — ED Notes (Signed)
Pt ambulatory with stand by assistance to and from bathroom.

## 2019-06-12 NOTE — ED Notes (Signed)
Port deaccessed.    Discharge paperwork reviewed with pt, including prescriptions.  Pt stated she would have someone come pick her up.  Pt wheeled to ED exit to meet ride.

## 2019-06-12 NOTE — ED Provider Notes (Signed)
[facility] DEPT Provider Note   CSN: [id number] Arrival date & time: 06/12/19  0034    History   Chief Complaint Chief Complaint  Patient presents with  . Emesis    HPI Julie Rollins is a 73 y.o. female.   The history is provided by the patient.  Emesis She has history of hypertension, breast cancer, pancreatic cancer and comes in because of anorexia and nausea.  She states that she has not been able to eat anything for the last 3 days because of anorexia, and started vomiting 2 days ago.  She is feeling generally weak.  She is supposed to start radiation therapy next week.  She denies exposure to a COVID-19.  Past Medical History:  Diagnosis Date  . Anxiety   . Blood transfusion without reported diagnosis   . Breast CA ([place])    s/p lumpectomy and radiation 2000  . Breast cancer ([place])   . Colitis    2007  . Dyspnea   . Dysrhythmia   . Family history of bladder cancer   . Family history of stomach cancer   . Family history of throat cancer   . Hernia, epigastric   . HTN (hypertension)    "mild hypertension", was on BP med years ago but it caused orthostatic hypotension and she has not been medicated since  . Hypothyroid   . Pancreatic cancer ([place]) dx'd 03/2019  . Personal history of breast cancer   . Personal history of radiation therapy 2001  . Poor appetite 04/2019  . Vertigo   . Vertigo     Patient Active Problem List   Diagnosis Date Noted  . Genetic testing 06/08/2019  . BRCA2 gene mutation positive in female 06/08/2019  . Family history of bladder cancer   . Family history of stomach cancer   . Family history of throat cancer   . Personal history of breast cancer   . Port-A-Cath in place 04/27/2019  . Malignant neoplasm of head of pancreas ([place]) 04/04/2019  . Pancreatic mass 03/28/2019  . Elevated LFTs 03/28/2019  . Hypothyroidism 03/28/2019  . Diarrhea 03/28/2019  . Generalized weakness   . Jaundice   . Abnormal  finding on GI tract imaging   . Elevated alkaline phosphatase level   . Cervical spine fracture ([place]) 07/31/2018    Past Surgical History:  Procedure Laterality Date  . ABDOMINAL HYSTERECTOMY     25yr ago  . ANTERIOR CERVICAL DECOMP/DISCECTOMY FUSION N/A 08/02/2018   Procedure: ANTERIOR CERVICAL DECOMPRESSION/DISCECTOMY FUSION CERVICAL FIVE- CERVICAL SIX;  Surgeon: PEarnie Larsson MD;  [place]  Service: Neurosurgery;  Laterality: N/A;  ANTERIOR CERVICAL DECOMPRESSION/DISCECTOMY FUSION CERVICAL FIVE- CERVICAL SIX  . BILIARY BRUSHING  03/29/2019   Procedure: BILIARY BRUSHING;  Surgeon: JMilus Banister MD;  [facility];  Service: Endoscopy;;  . BILIARY STENT PLACEMENT  03/29/2019   Procedure: BILIARY STENT PLACEMENT;  Surgeon: JMilus Banister MD;  [facility];  Service: Endoscopy;;  . BIOPSY  03/29/2019   Procedure: BIOPSY;  Surgeon: JMilus Banister MD;  [facility];  Service: Endoscopy;;  . BREAST LUMPECTOMY     2000  . ERCP N/A 03/29/2019   Procedure: ENDOSCOPIC RETROGRADE CHOLANGIOPANCREATOGRAPHY (ERCP);  Surgeon: JMilus Banister MD;  [facility];  Service: Endoscopy;  Laterality: N/A;  . ESOPHAGOGASTRODUODENOSCOPY (EGD) WITH PROPOFOL N/A 03/29/2019   Procedure: ESOPHAGOGASTRODUODENOSCOPY (EGD) WITH PROPOFOL;  Surgeon: JMilus Banister MD;  [facility];  Service: Endoscopy;  Laterality:  N/A;  . ESOPHAGOGASTRODUODENOSCOPY (EGD) WITH PROPOFOL N/A 06/09/2019   Procedure: ESOPHAGOGASTRODUODENOSCOPY (EGD) WITH PROPOFOL;  Surgeon: [name], MD;  [facility] END[facility]COPY;  Service: Endoscopy;  Laterality: N/A;  . EUS N/A 03/29/2019   Procedure: UPPER ENDOSCOPIC ULTRASOUND (EUS) LINEAR;  Surgeon: [name], MD;  [facility];  Service: Endoscopy;  Laterality: N/A;  . EUS N/A 06/09/2019   Procedure: UPPER ENDOSCOPIC ULTRASOUND (EUS) LINEAR;  Surgeon: [name], MD;  Location: [facility] ENDOSCOPY;  Service: Endoscopy;   Laterality: N/A;  . FIDUCIAL MARKER PLACEMENT  06/09/2019   Procedure: FIDUCIAL MARKER PLACEMENT;  Surgeon: [name], MD;  Location: [facility] ENDOSCOPY;  Service: Endoscopy;;  . FINE NEEDLE ASPIRATION  03/29/2019   Procedure: FINE NEEDLE ASPIRATION (FNA);  Surgeon: [name], MD;  [facility];  Service: Endoscopy;;  . HERNIA REPAIR     20+ years ago  . PORTACATH PLACEMENT N/A 04/12/2019   Procedure: INSERTION PORT-A-CATH LEFT SUBCLAVIAN;  Surgeon: Stark Klein, MD;  Location: [facility];  Service: General;  Laterality: N/A;  . RETINAL DETACHMENT SURGERY    . SPHINCTEROTOMY  03/29/2019   Procedure: SPHINCTEROTOMY;  Surgeon: [name], MD;  Location[facility];  Service: Endoscopy;;     OB History   No obstetric history on file.      Home Medications    Prior to Admission medications   Medication Sig Start Date End Date Taking? Authorizing Provider  acetaminophen (TYLENOL) 325 MG tablet Take 2 tablets (650 mg total) by mouth every 8 (eight) hours. Patient not taking: Reported on 06/01/2019 08/05/18   Wellington Hampshire, PA-C  diltiazem (CARDIZEM CD) 180 MG 24 hr capsule Take 180 mg by mouth daily. 05/18/18   [provider]  diphenoxylate-atropine (LOMOTIL) 2.5-0.025 MG tablet Take 2 tablets by mouth 4 (four) times daily as needed for diarrhea or loose stools. 04/27/19   Truitt Merle, MD  HYDROcodone-acetaminophen (NORCO/VICODIN) 5-325 MG tablet Take 0.5-1 tablets by mouth every 6 (six) hours as needed for moderate pain or severe pain. 04/12/19   Stark Klein, MD  levothyroxine (SYNTHROID, LEVOTHROID) 50 MCG tablet Take 50 mcg by mouth every morning.    [provider]  lipase/protease/amylase (CREON) 12000 units CPEP capsule Take 1 capsule with snack and 2 capsules with meals daily Patient taking differently: Take 24,000 Units by mouth 3 (three) times daily with meals.  05/16/19   Alla Feeling, NP  LORazepam (ATIVAN) 0.5 MG tablet Take  0.5 tablets (0.25 mg total) by mouth every 8 (eight) hours as needed for anxiety. 05/04/19   Alla Feeling, NP  mirtazapine (REMERON) 7.5 MG tablet Take 1 tablet (7.5 mg total) by mouth at bedtime. 05/16/19   Alla Feeling, NP  OLANZapine (ZYPREXA) 5 MG tablet Take 1 tablet (5 mg total) by mouth daily. 05/23/19   Truitt Merle, MD  ondansetron (ZOFRAN) 4 MG tablet Take 1 tablet (4 mg total) by mouth every 8 (eight) hours as needed for nausea or vomiting. 05/23/19   Truitt Merle, MD  pantoprazole (PROTONIX) 20 MG tablet Take 1 tablet (20 mg total) by mouth 2 (two) times daily before a meal. Patient taking differently: Take 20 mg by mouth daily as needed for heartburn or indigestion.  03/30/19 03/29/20  Danford, Suann Larry, MD  potassium chloride 20 MEQ/15ML (10%) SOLN Take 15 mLs (20 mEq total) by mouth daily. Patient not taking: Reported on 06/01/2019 05/04/19   Alla Feeling, NP  prochlorperazine (  COMPAZINE) 10 MG tablet Take 10 mg by mouth every 6 (six) hours as needed for nausea or vomiting.    [provider]    Family History Family History  Problem Relation Age of Onset  . Bladder Cancer Brother   . Other Other        Denies family h/o cardiac disease  . Stomach cancer Maternal Uncle   . Throat cancer Maternal Uncle     Social History Social History   Tobacco Use  . Smoking status: Never Smoker  . Smokeless tobacco: Never Used  Substance Use Topics  . Alcohol use: No  . Drug use: No     Allergies   Codeine   Review of Systems Review of Systems  Gastrointestinal: Positive for vomiting.  All other systems reviewed and are negative.    Physical Exam Updated Vital Signs BP 128/82 (BP Location: Right Arm)   Pulse (!) 113   Temp 98.3 F (36.8 C) (Oral)   Resp 18   Ht 5' 4" (1.626 m)   Wt 54.4 kg   SpO2 97%   BMI 20.60 kg/m   Physical Exam Vitals signs and nursing note reviewed.    73 year old female, resting comfortably and in no acute distress. Vital  signs are significant for rapid heart rate. Oxygen saturation is 97%, which is normal. Head is normocephalic and atraumatic. PERRLA, EOMI. Oropharynx is clear. Neck is nontender and supple without adenopathy or JVD. Back is nontender and there is no CVA tenderness. Lungs are clear without rales, wheezes, or rhonchi. Chest is nontender. Heart has regular rate and rhythm without murmur. Abdomen is soft, flat, nontender without masses or hepatosplenomegaly and peristalsis is hypoactive. Extremities have no cyanosis or edema, full range of motion is present. Skin is warm and dry without rash. Neurologic: Mental status is normal, cranial nerves are intact, there are no motor or sensory deficits.  ED Treatments / Results  Labs (all labs ordered are listed, but only abnormal results are displayed) Labs Reviewed  COMPREHENSIVE METABOLIC PANEL - Abnormal; Notable for the following components:      Result Value   Potassium 3.4 (*)    Glucose, Bld 124 (*)    Alkaline Phosphatase 136 (*)    All other components within normal limits  LIPASE, BLOOD - Abnormal; Notable for the following components:   Lipase 128 (*)    All other components within normal limits  CBC WITH DIFFERENTIAL/PLATELET   Procedures Procedures   Medications Ordered in ED Medications  sodium chloride 0.9 % bolus 1,000 mL (has no administration in time range)  ondansetron (ZOFRAN) injection 4 mg (has no administration in time range)     Initial Impression / Assessment and Plan / ED Course  I have reviewed the triage vital signs and the nursing notes.  Pertinent lab results that were available during my care of the patient were reviewed by me and considered in my medical decision making (see chart for details).  Anorexia, nausea, vomiting and patient with known pancreatic cancer.  Old records are reviewed, and she did have an endoscopy 3 days ago which did show partial gastric outlet obstruction, which may be contributing  to her episodes of emesis.  She will be given IV fluids, ondansetron and screening labs will be checked.  Labs are surprisingly good.  She has mild hypokalemia, but normal renal function.  Lipase is elevated but not significantly changed from recent values.  She had good relief of nausea with intravenous  ondansetron.  I am concerned that she might not tolerate oral antiemetics at home, so she is given prescriptions for ondansetron oral dissolving tablet and also prochlorperazine suppositories.  She is to keep her appointment with radiation oncology later this week.  Return precautions discussed.  Final Clinical Impressions(s) / ED Diagnoses   Final diagnoses:  Non-intractable vomiting with nausea, unspecified vomiting type  Malignant neoplasm of pancreas, unspecified location of malignancy ([place])  Elevated lipase    ED Discharge Orders         Ordered    ondansetron (ZOFRAN-ODT) 8 MG disintegrating tablet  Every 8 hours PRN     06/12/19 0705    prochlorperazine (COMPAZINE) 25 MG suppository  Every 12 hours PRN     06/12/19 4142           Delora Fuel, MD 39/53/20 [phone]

## 2019-06-12 NOTE — Telephone Encounter (Signed)
Patient called answering service today. Diagnosed with pancreatic can. She wanted Dr.Jacobs to know she went to [facility] ED today due to N/V. CBC, LFTs and lipase were ok. She received IV fluids, RX for Ondansetron and Compazine suppository. I advised she take the Ondansetron Q 8 hrs for the next 24 hrs, slowly try clear liquid diet, advance diet as tolerated. She is to start radiations this week. I advised pt to call answering service if she has persistent vomiting, if severe then back to ED. I will msg Dr. Ardis Hughs.

## 2019-06-12 NOTE — ED Triage Notes (Signed)
Per EMS - [place] pt. Had endoscopy on Thursday to place markers. Since has not been feeling well. Has not been able to keep anything down/ N/V x approx 3 days. + for orthostatic drop.    110 68 114 HR 95% RA CBG 151 99.5

## 2019-06-12 NOTE — Discharge Instructions (Addendum)
Keep your appointment for radiation treatment.  Return if nausea and vomiting are not being controlled adequately at home.

## 2019-06-13 ENCOUNTER — Inpatient Hospital Stay: Payer: Medicare HMO

## 2019-06-13 ENCOUNTER — Inpatient Hospital Stay: Payer: Medicare HMO | Admitting: Nurse Practitioner

## 2019-06-13 ENCOUNTER — Inpatient Hospital Stay (HOSPITAL_BASED_OUTPATIENT_CLINIC_OR_DEPARTMENT_OTHER): Payer: Medicare HMO | Admitting: Nurse Practitioner

## 2019-06-13 ENCOUNTER — Encounter (HOSPITAL_COMMUNITY): Payer: Self-pay

## 2019-06-13 ENCOUNTER — Other Ambulatory Visit: Payer: Self-pay

## 2019-06-13 ENCOUNTER — Encounter: Payer: Self-pay | Admitting: Nurse Practitioner

## 2019-06-13 ENCOUNTER — Ambulatory Visit (HOSPITAL_COMMUNITY)
Admission: RE | Admit: 2019-06-13 | Discharge: 2019-06-13 | Disposition: A | Discharge status: Home / Self Care | Payer: Medicare HMO | Source: Ambulatory Visit | Attending: Nurse Practitioner | Admitting: Nurse Practitioner

## 2019-06-13 ENCOUNTER — Inpatient Hospital Stay (HOSPITAL_COMMUNITY)
Admission: AD | Admit: 2019-06-13 | Discharge: 2019-06-23 | DRG: 380 | Disposition: A | Discharge status: Skilled Nursing Facility | Payer: Medicare HMO | Source: Ambulatory Visit | Attending: Family Medicine | Admitting: Family Medicine

## 2019-06-13 ENCOUNTER — Inpatient Hospital Stay (HOSPITAL_COMMUNITY): Payer: Medicare HMO

## 2019-06-13 VITALS — BP 115/78 | HR 115 | Temp 98.5°F | Resp 17 | Ht 64.0 in | Wt 118.4 lb

## 2019-06-13 DIAGNOSIS — I447 Left bundle-branch block, unspecified: Secondary | ICD-10-CM | POA: Diagnosis present

## 2019-06-13 DIAGNOSIS — Z9221 Personal history of antineoplastic chemotherapy: Secondary | ICD-10-CM | POA: Diagnosis not present

## 2019-06-13 DIAGNOSIS — Z8052 Family history of malignant neoplasm of bladder: Secondary | ICD-10-CM | POA: Diagnosis not present

## 2019-06-13 DIAGNOSIS — K311 Adult hypertrophic pyloric stenosis: Secondary | ICD-10-CM | POA: Diagnosis not present

## 2019-06-13 DIAGNOSIS — Z20828 Contact with and (suspected) exposure to other viral communicable diseases: Secondary | ICD-10-CM | POA: Diagnosis present

## 2019-06-13 DIAGNOSIS — K315 Obstruction of duodenum: Secondary | ICD-10-CM | POA: Diagnosis present

## 2019-06-13 DIAGNOSIS — D630 Anemia in neoplastic disease: Secondary | ICD-10-CM | POA: Diagnosis present

## 2019-06-13 DIAGNOSIS — R531 Weakness: Secondary | ICD-10-CM

## 2019-06-13 DIAGNOSIS — I10 Essential (primary) hypertension: Secondary | ICD-10-CM | POA: Diagnosis present

## 2019-06-13 DIAGNOSIS — Z853 Personal history of malignant neoplasm of breast: Secondary | ICD-10-CM

## 2019-06-13 DIAGNOSIS — F329 Major depressive disorder, single episode, unspecified: Secondary | ICD-10-CM | POA: Diagnosis present

## 2019-06-13 DIAGNOSIS — Z800 Family history of malignant neoplasm of digestive organs: Secondary | ICD-10-CM

## 2019-06-13 DIAGNOSIS — R112 Nausea with vomiting, unspecified: Secondary | ICD-10-CM

## 2019-06-13 DIAGNOSIS — R739 Hyperglycemia, unspecified: Secondary | ICD-10-CM | POA: Diagnosis present

## 2019-06-13 DIAGNOSIS — E860 Dehydration: Secondary | ICD-10-CM | POA: Diagnosis present

## 2019-06-13 DIAGNOSIS — K859 Acute pancreatitis without necrosis or infection, unspecified: Secondary | ICD-10-CM | POA: Diagnosis present

## 2019-06-13 DIAGNOSIS — R933 Abnormal findings on diagnostic imaging of other parts of digestive tract: Secondary | ICD-10-CM | POA: Diagnosis not present

## 2019-06-13 DIAGNOSIS — Z90710 Acquired absence of both cervix and uterus: Secondary | ICD-10-CM

## 2019-06-13 DIAGNOSIS — R9431 Abnormal electrocardiogram [ECG] [EKG]: Secondary | ICD-10-CM | POA: Diagnosis present

## 2019-06-13 DIAGNOSIS — E43 Unspecified severe protein-calorie malnutrition: Secondary | ICD-10-CM | POA: Diagnosis present

## 2019-06-13 DIAGNOSIS — Z789 Other specified health status: Secondary | ICD-10-CM

## 2019-06-13 DIAGNOSIS — K8689 Other specified diseases of pancreas: Secondary | ICD-10-CM | POA: Diagnosis not present

## 2019-06-13 DIAGNOSIS — R1115 Cyclical vomiting syndrome unrelated to migraine: Secondary | ICD-10-CM | POA: Diagnosis not present

## 2019-06-13 DIAGNOSIS — C250 Malignant neoplasm of head of pancreas: Secondary | ICD-10-CM

## 2019-06-13 DIAGNOSIS — Z79890 Hormone replacement therapy (postmenopausal): Secondary | ICD-10-CM

## 2019-06-13 DIAGNOSIS — Z808 Family history of malignant neoplasm of other organs or systems: Secondary | ICD-10-CM

## 2019-06-13 DIAGNOSIS — E039 Hypothyroidism, unspecified: Secondary | ICD-10-CM | POA: Diagnosis present

## 2019-06-13 DIAGNOSIS — Z79899 Other long term (current) drug therapy: Secondary | ICD-10-CM

## 2019-06-13 DIAGNOSIS — D509 Iron deficiency anemia, unspecified: Secondary | ICD-10-CM | POA: Diagnosis present

## 2019-06-13 DIAGNOSIS — E876 Hypokalemia: Secondary | ICD-10-CM | POA: Diagnosis present

## 2019-06-13 DIAGNOSIS — C784 Secondary malignant neoplasm of small intestine: Secondary | ICD-10-CM | POA: Diagnosis not present

## 2019-06-13 DIAGNOSIS — E440 Moderate protein-calorie malnutrition: Secondary | ICD-10-CM | POA: Diagnosis not present

## 2019-06-13 DIAGNOSIS — F419 Anxiety disorder, unspecified: Secondary | ICD-10-CM | POA: Diagnosis present

## 2019-06-13 DIAGNOSIS — Z923 Personal history of irradiation: Secondary | ICD-10-CM | POA: Diagnosis not present

## 2019-06-13 DIAGNOSIS — Z95828 Presence of other vascular implants and grafts: Secondary | ICD-10-CM

## 2019-06-13 DIAGNOSIS — R000 Tachycardia, unspecified: Secondary | ICD-10-CM | POA: Diagnosis present

## 2019-06-13 DIAGNOSIS — K625 Hemorrhage of anus and rectum: Secondary | ICD-10-CM | POA: Diagnosis present

## 2019-06-13 DIAGNOSIS — R627 Adult failure to thrive: Secondary | ICD-10-CM | POA: Diagnosis present

## 2019-06-13 DIAGNOSIS — Z981 Arthrodesis status: Secondary | ICD-10-CM | POA: Diagnosis not present

## 2019-06-13 DIAGNOSIS — Z885 Allergy status to narcotic agent status: Secondary | ICD-10-CM

## 2019-06-13 DIAGNOSIS — R1110 Vomiting, unspecified: Secondary | ICD-10-CM

## 2019-06-13 DIAGNOSIS — E162 Hypoglycemia, unspecified: Secondary | ICD-10-CM | POA: Diagnosis not present

## 2019-06-13 DIAGNOSIS — Z6820 Body mass index (BMI) 20.0-20.9, adult: Secondary | ICD-10-CM

## 2019-06-13 DIAGNOSIS — Z4659 Encounter for fitting and adjustment of other gastrointestinal appliance and device: Secondary | ICD-10-CM | POA: Diagnosis not present

## 2019-06-13 DIAGNOSIS — K649 Unspecified hemorrhoids: Secondary | ICD-10-CM | POA: Diagnosis present

## 2019-06-13 LAB — LIPASE, BLOOD: Lipase: 81 U/L — ABNORMAL HIGH (ref 11–51)

## 2019-06-13 LAB — CBC
HCT: 41.5 % (ref 36.0–46.0)
Hemoglobin: 13.2 g/dL (ref 12.0–15.0)
MCH: 31.1 pg (ref 26.0–34.0)
MCHC: 31.8 g/dL (ref 30.0–36.0)
MCV: 97.6 fL (ref 80.0–100.0)
Platelets: 338 10*3/uL (ref 150–400)
RBC: 4.25 MIL/uL (ref 3.87–5.11)
RDW: 13.4 % (ref 11.5–15.5)
WBC: 7.9 10*3/uL (ref 4.0–10.5)
nRBC: 0 % (ref 0.0–0.2)

## 2019-06-13 LAB — CBC WITH DIFFERENTIAL (CANCER CENTER ONLY)
Abs Immature Granulocytes: 0.02 10*3/uL (ref 0.00–0.07)
Basophils Absolute: 0.1 10*3/uL (ref 0.0–0.1)
Basophils Relative: 1 %
Eosinophils Absolute: 0.3 10*3/uL (ref 0.0–0.5)
Eosinophils Relative: 4 %
HCT: 39.7 % (ref 36.0–46.0)
Hemoglobin: 13.1 g/dL (ref 12.0–15.0)
Immature Granulocytes: 0 %
Lymphocytes Relative: 15 %
Lymphs Abs: 1.2 10*3/uL (ref 0.7–4.0)
MCH: 31.5 pg (ref 26.0–34.0)
MCHC: 33 g/dL (ref 30.0–36.0)
MCV: 95.4 fL (ref 80.0–100.0)
Monocytes Absolute: 0.6 10*3/uL (ref 0.1–1.0)
Monocytes Relative: 8 %
Neutro Abs: 5.4 10*3/uL (ref 1.7–7.7)
Neutrophils Relative %: 72 %
Platelet Count: 352 10*3/uL (ref 150–400)
RBC: 4.16 MIL/uL (ref 3.87–5.11)
RDW: 13.4 % (ref 11.5–15.5)
WBC Count: 7.5 10*3/uL (ref 4.0–10.5)
nRBC: 0 % (ref 0.0–0.2)

## 2019-06-13 LAB — CMP (CANCER CENTER ONLY)
ALT: 26 U/L (ref 0–44)
AST: 25 U/L (ref 15–41)
Albumin: 3.4 g/dL — ABNORMAL LOW (ref 3.5–5.0)
Alkaline Phosphatase: 142 U/L — ABNORMAL HIGH (ref 38–126)
Anion gap: 12 (ref 5–15)
BUN: 17 mg/dL (ref 8–23)
CO2: 32 mmol/L (ref 22–32)
Calcium: 9.9 mg/dL (ref 8.9–10.3)
Chloride: 98 mmol/L (ref 98–111)
Creatinine: 0.72 mg/dL (ref 0.44–1.00)
GFR, Est AFR Am: >60 mL/min (ref >60)
GFR, Estimated: >60 mL/min (ref >60)
Glucose, Bld: 121 mg/dL — ABNORMAL HIGH (ref 70–99)
Potassium: 3.2 mmol/L — ABNORMAL LOW (ref 3.5–5.1)
Sodium: 142 mmol/L (ref 135–145)
Total Bilirubin: 0.4 mg/dL (ref 0.3–1.2)
Total Protein: 6.7 g/dL (ref 6.5–8.1)

## 2019-06-13 LAB — CREATININE, SERUM
Creatinine, Ser: 0.72 mg/dL (ref 0.44–1.00)
GFR calc Af Amer: >60 mL/min (ref >60)
GFR calc non Af Amer: >60 mL/min (ref >60)

## 2019-06-13 LAB — URINALYSIS, COMPLETE (UACMP) WITH MICROSCOPIC
Bilirubin Urine: NEGATIVE
Glucose, UA: NEGATIVE mg/dL
Hgb urine dipstick: NEGATIVE
Ketones, ur: 80 mg/dL — AB
Nitrite: NEGATIVE
Protein, ur: 30 mg/dL — AB
Specific Gravity, Urine: 1.029 (ref 1.005–1.030)
pH: 5 (ref 5.0–8.0)

## 2019-06-13 LAB — PHOSPHORUS: Phosphorus: 3.4 mg/dL (ref 2.5–4.6)

## 2019-06-13 LAB — MAGNESIUM: Magnesium: 2 mg/dL (ref 1.7–2.4)

## 2019-06-13 LAB — SARS CORONAVIRUS 2 BY RT PCR (HOSPITAL ORDER, PERFORMED IN ~~LOC~~ HOSPITAL LAB): SARS Coronavirus 2: NEGATIVE

## 2019-06-13 MED ORDER — SODIUM CHLORIDE 0.9% FLUSH
10.0000 mL | INTRAVENOUS | Status: DC | PRN
  Administered 2019-06-17 – 2019-06-23 (×4): 10 mL
  Filled 2019-06-13 (×4): qty 40

## 2019-06-13 MED ORDER — ACETAMINOPHEN 325 MG PO TABS: 650.0000 mg | ORAL_TABLET | Freq: Four times a day (QID) | ORAL | Status: DC | PRN

## 2019-06-13 MED ORDER — SENNOSIDES-DOCUSATE SODIUM 8.6-50 MG PO TABS: 1.0000 | ORAL_TABLET | Freq: Every evening | ORAL | Status: DC | PRN

## 2019-06-13 MED ORDER — ACETAMINOPHEN 650 MG RE SUPP: 650.0000 mg | Freq: Four times a day (QID) | RECTAL | Status: DC | PRN

## 2019-06-13 MED ORDER — MIRTAZAPINE 15 MG PO TABS
7.5000 mg | ORAL_TABLET | Freq: Every day | ORAL | Status: DC
  Administered 2019-06-16 – 2019-06-22 (×7): 7.5 mg via ORAL
  Filled 2019-06-13 (×7): qty 1

## 2019-06-13 MED ORDER — ONDANSETRON HCL 4 MG/2ML IJ SOLN
4.0000 mg | Freq: Four times a day (QID) | INTRAMUSCULAR | Status: DC | PRN
  Administered 2019-06-13 – 2019-06-14 (×2): 4 mg via INTRAVENOUS
  Filled 2019-06-13 (×2): qty 2

## 2019-06-13 MED ORDER — DILTIAZEM HCL ER COATED BEADS 180 MG PO CP24: 180.0000 mg | ORAL_CAPSULE | Freq: Every day | ORAL | Status: DC

## 2019-06-13 MED ORDER — ONDANSETRON HCL 4 MG PO TABS: 4.0000 mg | ORAL_TABLET | Freq: Four times a day (QID) | ORAL | Status: DC | PRN

## 2019-06-13 MED ORDER — SODIUM CHLORIDE 0.9 % IV SOLN
INTRAVENOUS | Status: DC
  Administered 2019-06-13 – 2019-06-14 (×3): via INTRAVENOUS

## 2019-06-13 MED ORDER — BISACODYL 10 MG RE SUPP: 10.0000 mg | Freq: Every day | RECTAL | Status: DC | PRN

## 2019-06-13 MED ORDER — ORAL CARE MOUTH RINSE
15.0000 mL | Freq: Two times a day (BID) | OROMUCOSAL | Status: DC
  Administered 2019-06-14 – 2019-06-22 (×11): 15 mL via OROMUCOSAL

## 2019-06-13 MED ORDER — SODIUM CHLORIDE 0.9 % IV BOLUS
500.0000 mL | Freq: Once | INTRAVENOUS | Status: AC
  Administered 2019-06-13: 500 mL via INTRAVENOUS

## 2019-06-13 MED ORDER — ALTEPLASE 2 MG IJ SOLR
2.0000 mg | Freq: Once | INTRAMUSCULAR | Status: AC
  Administered 2019-06-13: 21:00:00 2 mg
  Filled 2019-06-13: qty 2

## 2019-06-13 MED ORDER — HEPARIN SODIUM (PORCINE) 5000 UNIT/ML IJ SOLN
5000.0000 [IU] | Freq: Three times a day (TID) | INTRAMUSCULAR | Status: DC
  Administered 2019-06-14 – 2019-06-15 (×6): 5000 [IU] via SUBCUTANEOUS
  Filled 2019-06-13 (×6): qty 1

## 2019-06-13 MED ORDER — LEVOTHYROXINE SODIUM 100 MCG/5ML IV SOLN
25.0000 ug | Freq: Every day | INTRAVENOUS | Status: DC
  Administered 2019-06-14 – 2019-06-23 (×9): 25 ug via INTRAVENOUS
  Filled 2019-06-13 (×11): qty 5

## 2019-06-13 MED ORDER — SODIUM CHLORIDE 0.9% FLUSH
10.0000 mL | INTRAVENOUS | Status: DC | PRN
  Administered 2019-06-13: 14:00:00 10 mL
  Filled 2019-06-13: qty 10

## 2019-06-13 MED ORDER — LORAZEPAM 2 MG/ML IJ SOLN
0.5000 mg | Freq: Once | INTRAMUSCULAR | Status: AC
  Administered 2019-06-14: 0.5 mg via INTRAVENOUS
  Filled 2019-06-13: qty 1

## 2019-06-13 MED ORDER — PHENOL 1.4 % MT LIQD
1.0000 | OROMUCOSAL | Status: DC | PRN
  Filled 2019-06-13: qty 177

## 2019-06-13 MED ORDER — CHLORHEXIDINE GLUCONATE 0.12 % MT SOLN
15.0000 mL | Freq: Two times a day (BID) | OROMUCOSAL | Status: DC
  Administered 2019-06-14 – 2019-06-23 (×16): 15 mL via OROMUCOSAL
  Filled 2019-06-13 (×19): qty 15

## 2019-06-13 MED ORDER — LEVOTHYROXINE SODIUM 50 MCG PO TABS: 50.0000 ug | ORAL_TABLET | Freq: Every day | ORAL | Status: DC

## 2019-06-13 MED ORDER — POTASSIUM CHLORIDE 10 MEQ/100ML IV SOLN
10.0000 meq | INTRAVENOUS | Status: AC
  Administered 2019-06-13 – 2019-06-14 (×4): 10 meq via INTRAVENOUS
  Filled 2019-06-13 (×3): qty 100

## 2019-06-13 NOTE — Progress Notes (Addendum)
[facility] OFFICE PROGRESS NOTE   Diagnosis: Pancreas cancer  INTERVAL HISTORY:   Ms. Gunnoe returns as scheduled.  She is followed by Dr. [name].  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 Ardis Hughs on 03/29/2019. She began neoadjuvant FOLFIRINOX on 04/13/2019.  She completed cycle 2 on 04/27/2019.  She did not tolerate the chemotherapy well.  Chemotherapy was discontinued after cycle 2 FOLFIRINOX.  She requires intermittent IV fluids.  She was seen by Dr. Lisbeth Renshaw 06/01/2019 with the plan to proceed with stereotactic radiation before surgical resection.  She is scheduled for simulation 06/16/2019.  She underwent an upper EUS by Dr. Ardis Hughs on 06/09/2019.  Fiducial markers were placed.  She was noted to have duodenal edema and luminal narrowing in the distal duodenal bulb likely from the adjacent pancreatic mass.  This was causing partial gastric outlet obstruction.  She was seen in the emergency department yesterday with complaints of anorexia and nausea.  She received IV fluids and IV Zofran with good relief of the nausea.  She was discharged home with prescriptions for Zofran ODT and Compazine suppositories.  She reports intermittent nausea with periodic vomiting for many weeks.  Following the endoscopy 06/09/2019 she reports developing frequent nausea/vomiting.  She states she is unable to eat or take her medications.  She estimates vomiting 3 or 4 times yesterday.  Today she was able to drink a carnation instant breakfast and V8 juice.  She vomited upon arriving in the office.  At times the vomiting/regurgitation occurs within a few minutes of intake.  She feels bloated.  Last bowel movement was 2 days ago.  She had an episode of lightheadedness yesterday.  She has mild  dyspnea on exertion.  No cough.  No fever.  She has intermittent chills.  She notes that she tires easily.  Objective:  Vital signs in last 24 hours:  Blood pressure 115/78, pulse (!) 115, temperature 98.5 F (36.9 C), temperature source Oral, resp. rate 17, height 5\' 4"  (1.626 m), weight 118 lb 6.4 oz (53.7 kg), SpO2 99 %.    HEENT: No thrush or ulcers. Resp: Lungs clear bilaterally. Cardio: Regular, tachycardic. GI: Abdomen is distended.  No hepatomegaly.  Hypoactive bowel sounds. Vascular: No leg edema. Neuro: Alert and oriented. Skin: Mild decrease in skin turgor. Port-A-Cath without erythema.  Lab Results:  Lab Results  Component Value Date   WBC 7.5 06/13/2019   HGB 13.1 06/13/2019   HCT 39.7 06/13/2019   MCV 95.4 06/13/2019   PLT 352 06/13/2019   NEUTROABS 5.4 06/13/2019    Imaging:  No results found.  Medications: I have reviewed the patient's current medications.  Assessment/Plan: 1. 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  Cycle 1 neoadjuvant FOLFIRINOX 04/13/2027, cycle 2 FOLFIRINOX 04/27/2019.  Tolerated poorly with chemotherapy discontinued after cycle 2.  Has been referred for SBRT with simulation planned 06/16/2019. 2. Status post ERCP with stenting of a malignant stricture of the distal common bile duct by Dr. Ardis Hughs on 03/29/2019 3. Upper EUS 06/09/2019-small amount of liquid retained fluid in the stomach.  Significant edema and incomplete luminal narrowing in the distal duodenal bulb.  Unable to  advance the echoendoscope more distal than the duodenal bulb.  Endosonographic findings-round irregularly bordered hypoechoic mass in the pancreatic head.  Previously placed metal biliary stent passes directly adjacent to the pancreatic mass within the common bile duct.  Four preloaded fiducial markers  placed into the pancreatic head mass.  Duodenal edema and luminal narrowing in the distal duodenal bulb likely from adjacent pancreatic mass causing partial gastric outlet obstruction.  Disposition: Ms. Pressman is a 73 year old woman with pancreas cancer status post 2 cycles of neoadjuvant FOLFIRINOX with poor tolerance.  Cycle 2 was administered beginning 04/27/2019.  Chemotherapy was subsequently discontinued.  She is scheduled for SBRT simulation later this week.  She underwent an upper EUS on 06/09/2019 with the placement of fiducial markers.  Partial gastric outlet obstruction related to the pancreas mass was noted.  She was evaluated in the emergency department yesterday with increased nausea/vomiting.  She is seen today with continued nausea/vomiting unable to maintain adequate hydration.  We are concerned symptoms are due to the gastric outlet obstruction.  We are contacting the hospitalist for admission, GI consultation.  Patient seen with Dr. Benay Spice.    Ned Card ANP/GNP-BC   06/13/2019  1:58 PM  This was a shared visit with Ned Card.  Ms. Riling was interviewed and examined.  Results of the endoscopy from 06/09/2019 are noted.  She presents with intractable nausea and vomiting.  I suspect she has a degree of gastric outlet obstruction.  We will arrange for hospital admission.  She may be a candidate for a duodenal stent.  Julieanne Manson, MD

## 2019-06-13 NOTE — Plan of Care (Signed)
  Problem: Education: Goal: Knowledge of General Education information will improve Description: Including pain rating scale, medication(s)/side effects and non-pharmacologic comfort measures Outcome: Progressing   Problem: Health Behavior/Discharge Planning: Goal: Ability to manage health-related needs will improve Outcome: Progressing   Problem: Activity: Goal: Risk for activity intolerance will decrease Outcome: Progressing   

## 2019-06-13 NOTE — H&P (Signed)
History and Physical    Julie Rollins Julie Rollins:[id number] DOB: 1946-10-05 DOA: 06/13/2019  PCP: Nolene Ebbs, MD   Patient coming from: Home via oncology office  Chief Complaint: Nausea and Vomiting  HPI: Julie Rollins is a 73 y.o. female with medical history significant for anxiety and depression, history of breast cancer status post lumpectomy and radiation 2000, history of dysrhythmia, hypertension, history of hypothyroidism, recently diagnosed pancreatic cancer who recently underwent an EUS on 06/09/2019 for fiducial markings of the pancreas head mass.  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  Since her procedure she states that she has had significant nausea and vomiting and has inability to keep anything down.  Recently was seen in the ED yesterday and sent home with IV fluids and Compazine with no improvement and presented to her oncologist with similar complaints.  States that she has not had a proper meal and at least 2 days.  Denies any lightheadedness or dizziness but does feel weak.  No chest pain, lightheadedness or dizziness.  Has not had a bowel movement today she states from not eating properly.  Today she was seen by her gastroenterologist and they referred her as a direct admission to [facility] long.  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  ED Course: No ED course this patient was directly admitted  Review of Systems: As per HPI otherwise all other systems reviewed and negative.   Past Medical History:  Diagnosis Date  . Anxiety   . Blood transfusion without reported diagnosis   . Breast CA ([place])    s/p lumpectomy and radiation 2000  . Breast cancer ([place])   . Colitis    2007  . Dyspnea   . Dysrhythmia   . Family history of bladder cancer   . Family history of stomach  cancer   . Family history of throat cancer   . Hernia, epigastric   . HTN (hypertension)    "mild hypertension", was on BP med years ago but it caused orthostatic hypotension and she has not been medicated since  . Hypothyroid   . Pancreatic cancer ([place]) dx'd 03/2019  . Personal history of breast cancer   . Personal history of radiation therapy 2001  . Poor appetite 04/2019  . Vertigo   . Vertigo    Past Surgical History:  Procedure Laterality Date  . ABDOMINAL HYSTERECTOMY     23yrs ago  . ANTERIOR CERVICAL DECOMP/DISCECTOMY FUSION N/A 08/02/2018   Procedure: ANTERIOR CERVICAL DECOMPRESSION/DISCECTOMY FUSION CERVICAL FIVE- CERVICAL SIX;  Surgeon: Earnie Larsson, MD;  Location: [place];  Service: Neurosurgery;  Laterality: N/A;  ANTERIOR CERVICAL DECOMPRESSION/DISCECTOMY FUSION CERVICAL FIVE- CERVICAL SIX  . BILIARY BRUSHING  03/29/2019   Procedure: BILIARY BRUSHING;  Surgeon: [name], MD;  Location: [facility] ENDOSCOPY;  Service: Endoscopy;;  . BILIARY STENT PLACEMENT  03/29/2019   Procedure: BILIARY STENT PLACEMENT;  Surgeon: [name], MD;  Location: [facility] ENDOSCOPY;  Service: Endoscopy;;  . BIOPSY  03/29/2019   Procedure: BIOPSY;  Surgeon: [name], MD;  Location: [facility] ENDOSCOPY;  Service: Endoscopy;;  . BREAST LUMPECTOMY     2000  . ERCP N/A 03/29/2019   Procedure: ENDOSCOPIC RETROGRADE CHOLANGIOPANCREATOGRAPHY (ERCP);  Surgeon: [name], MD;  Location: [facility] ENDOSCOPY;  Service: Endoscopy;  Laterality: N/A;  . ESOPHAGOGASTRODUODENOSCOPY (EGD) WITH  PROPOFOL N/A 03/29/2019   Procedure: ESOPHAGOGASTRODUODENOSCOPY (EGD) WITH PROPOFOL;  Surgeon: [name], MD;  Location: [facility] ENDOSCOPY;  Service: Endoscopy;  Laterality: N/A;  . ESOPHAGOGASTRODUODENOSCOPY (EGD) WITH PROPOFOL N/A 06/09/2019   Procedure: ESOPHAGOGASTRODUODENOSCOPY (EGD) WITH PROPOFOL;  Surgeon: [name], MD;  Location: [facility] ENDOSCOPY;  Service: Endoscopy;  Laterality: N/A;  . EUS N/A 03/29/2019   Procedure:  UPPER ENDOSCOPIC ULTRASOUND (EUS) LINEAR;  Surgeon: [name], MD;  Location: [facility] ENDOSCOPY;  Service: Endoscopy;  Laterality: N/A;  . EUS N/A 06/09/2019   Procedure: UPPER ENDOSCOPIC ULTRASOUND (EUS) LINEAR;  Surgeon: [name], MD;  Location: [facility] ENDOSCOPY;  Service: Endoscopy;  Laterality: N/A;  . FIDUCIAL MARKER PLACEMENT  06/09/2019   Procedure: FIDUCIAL MARKER PLACEMENT;  Surgeon: [name], MD;  Location: [facility] ENDOSCOPY;  Service: Endoscopy;;  . FINE NEEDLE ASPIRATION  03/29/2019   Procedure: FINE NEEDLE ASPIRATION (FNA);  Surgeon: [name], MD;  Location: [facility] ENDOSCOPY;  Service: Endoscopy;;  . HERNIA REPAIR     20+ years ago  . PORTACATH PLACEMENT N/A 04/12/2019   Procedure: INSERTION PORT-A-CATH LEFT SUBCLAVIAN;  Surgeon: Stark Klein, MD;  Location: [place];  Service: General;  Laterality: N/A;  . RETINAL DETACHMENT SURGERY    . SPHINCTEROTOMY  03/29/2019   Procedure: SPHINCTEROTOMY;  Surgeon: [name], MD;  Location: [place];  Service: Endoscopy;;   SOCIAL HISTORY  reports that she has never smoked. She has never used smokeless tobacco. She reports that she does not drink alcohol or use drugs.  Allergies  Allergen Reactions  . Codeine Nausea Only   Family History  Problem Relation Age of Onset  . Bladder Cancer Brother   . Other Other        Denies family h/o cardiac disease  . Stomach cancer Maternal Uncle   . Throat cancer Maternal Uncle    Prior to Admission medications   Medication Sig Start Date End Date Taking? Authorizing Provider  diltiazem (CARDIZEM CD) 180 MG 24 hr capsule Take 180 mg by mouth daily. 05/18/18   [provider]  diphenoxylate-atropine (LOMOTIL) 2.5-0.025 MG tablet Take 2 tablets by mouth 4 (four) times daily as needed for diarrhea or loose stools. Patient not taking: Reported on 06/13/2019 04/27/19   Truitt Merle, MD  HYDROcodone-acetaminophen (NORCO/VICODIN) 5-325 MG tablet Take 0.5-1 tablets  by mouth every 6 (six) hours as needed for moderate pain or severe pain. Patient not taking: Reported on 06/13/2019 04/12/19   Stark Klein, MD  levothyroxine (SYNTHROID, LEVOTHROID) 50 MCG tablet Take 50 mcg by mouth every morning.    [provider]  lipase/protease/amylase (CREON) 12000 units CPEP capsule Take 1 capsule with snack and 2 capsules with meals daily Patient not taking: Reported on 06/13/2019 05/16/19   Alla Feeling, NP  LORazepam (ATIVAN) 0.5 MG tablet Take 0.5 tablets (0.25 mg total) by mouth every 8 (eight) hours as needed for anxiety. Patient not taking: Reported on 06/13/2019 05/04/19   Alla Feeling, NP  mirtazapine (REMERON) 7.5 MG tablet Take 1 tablet (7.5 mg total) by mouth at bedtime. 05/16/19   Alla Feeling, NP  OLANZapine (ZYPREXA) 5 MG tablet Take 1 tablet (5 mg total) by mouth daily. Patient not taking: Reported on 06/13/2019 05/23/19   Truitt Merle, MD  ondansetron (ZOFRAN) 4 MG tablet Take 1 tablet (4 mg total) by mouth every 8 (eight) hours as needed for nausea or vomiting. Patient not taking: Reported on 06/13/2019 05/23/19  Truitt Merle, MD  ondansetron (ZOFRAN-ODT) 8 MG disintegrating tablet Take 1 tablet (8 mg total) by mouth every 8 (eight) hours as needed for nausea or vomiting. 02/05/96   Delora Fuel, MD  pantoprazole (PROTONIX) 20 MG tablet Take 1 tablet (20 mg total) by mouth 2 (two) times daily before a meal. Patient not taking: Reported on 06/13/2019 03/30/19 03/29/20  Edwin Dada, MD  prochlorperazine (COMPAZINE) 10 MG tablet Take 10 mg by mouth every 6 (six) hours as needed for nausea or vomiting.    [provider]  prochlorperazine (COMPAZINE) 25 MG suppository Place 1 suppository (25 mg total) rectally every 12 (twelve) hours as needed for nausea or vomiting. Patient not taking: Reported on 0/26/3785 8/85/02   Delora Fuel, MD  potassium chloride 20 MEQ/15ML (10%) SOLN Take 15 mLs (20 mEq total) by mouth daily. Patient not taking:  Reported on 06/01/2019 05/04/19 06/12/19  Alla Feeling, NP    Physical Exam: Vitals:   06/13/19 1716  BP: (!) 156/73  Pulse: (!) 116  Resp: (!) 24  Temp: 98.5 F (36.9 C)  TempSrc: Oral  SpO2: 98%   Constitutional: Thin Chronically ill appearing Caucasian female in NAD and appears calm and comfortable Eyes: Lids and conjunctivae normal, sclerae anicteric  ENMT: External Ears, Nose appear normal. Grossly normal hearing. Mucous membranes are dry Neck: Appears normal, supple, no cervical masses, normal ROM, no appreciable thyromegaly; no JVD Respiratory: Diminished to auscultation bilaterally, no wheezing, rales, rhonchi or crackles. Normal respiratory effort and patient is not tachypenic. No accessory muscle use.  Cardiovascular: Tachycardic Rate, no murmurs / rubs / gallops. S1 and S2 auscultated. No extremity edema.   Abdomen: Soft, Tender to palpate, non-distended. No masses palpated. No appreciable hepatosplenomegaly. Bowel sounds positive x4.  GU: Deferred. Musculoskeletal: No clubbing / cyanosis of digits/nails. No joint deformity upper and lower extremities. Has a Port-A-Cath on the Right Skin: No rashes, lesions, ulcers on a limited skin eval. No induration; Warm and dry.  Neurologic: CN 2-12 grossly intact with no focal deficits.  Romberg sign abd cerebellar reflexes not assessed.  Psychiatric: Normal judgment and insight. Alert and oriented x 3. Slightly anxious mood and appropriate affect.   Labs on Admission: I have personally reviewed following labs and imaging studies  CBC: Recent Labs  Lab 06/12/19 0417 06/13/19 1321  WBC 7.4 7.5  NEUTROABS 5.6 5.4  HGB 13.3 13.1  HCT 41.5 39.7  MCV 97.4 95.4  PLT 311 774   Basic Metabolic Panel: Recent Labs  Lab 06/12/19 0417 06/13/19 1321  NA 141 142  K 3.4* 3.2*  CL 100 98  CO2 29 32  GLUCOSE 124* 121*  BUN 13 17  CREATININE 0.63 0.72  CALCIUM 9.5 9.9   GFR: Estimated Creatinine Clearance: 52.3 mL/min (by C-G  formula based on SCr of 0.72 mg/dL). Liver Function Tests: Recent Labs  Lab 06/12/19 0417 06/13/19 1321  AST 25 25  ALT 28 26  ALKPHOS 136* 142*  BILITOT 0.7 0.4  PROT 6.9 6.7  ALBUMIN 3.6 3.4*   Recent Labs  Lab 06/12/19 0417  LIPASE 128*   No results for input(s): AMMONIA in the last 168 hours. Coagulation Profile: No results for input(s): INR, PROTIME in the last 168 hours. Cardiac Enzymes: No results for input(s): CKTOTAL, CKMB, CKMBINDEX, TROPONINI in the last 168 hours. BNP (last 3 results) No results for input(s): PROBNP in the last 8760 hours. HbA1C: No results for input(s): HGBA1C in the last 72 hours. CBG: No  results for input(s): GLUCAP in the last 168 hours. Lipid Profile: No results for input(s): CHOL, HDL, LDLCALC, TRIG, CHOLHDL, LDLDIRECT in the last 72 hours. Thyroid Function Tests: No results for input(s): TSH, T4TOTAL, FREET4, T3FREE, THYROIDAB in the last 72 hours. Anemia Panel: No results for input(s): VITAMINB12, FOLATE, FERRITIN, TIBC, IRON, RETICCTPCT in the last 72 hours. Urine analysis:    Component Value Date/Time   COLORURINE YELLOW 04/23/2019 0233   APPEARANCEUR CLEAR 04/23/2019 0233   LABSPEC 1.013 04/23/2019 0233   PHURINE 5.0 04/23/2019 0233   GLUCOSEU NEGATIVE 04/23/2019 0233   HGBUR NEGATIVE 04/23/2019 0233   BILIRUBINUR NEGATIVE 04/23/2019 0233   KETONESUR NEGATIVE 04/23/2019 0233   PROTEINUR NEGATIVE 04/23/2019 0233   UROBILINOGEN 0.2 06/16/2014 0529   NITRITE NEGATIVE 04/23/2019 0233   LEUKOCYTESUR NEGATIVE 04/23/2019 0233   Sepsis Labs: !!!!!!!!!!!!!!!!!!!!!!!!!!!!!!!!!!!!!!!!!!!! @LABRCNTIP (procalcitonin:4,lacticidven:4) ) Recent Results (from the past 240 hour(s))  SARS Coronavirus 2 (Performed in [place] hospital lab)     Status: None   Collection Time: 06/07/19 10:04 AM   Specimen: Nasal Swab  Result Value Ref Range Status   SARS Coronavirus 2 NEGATIVE NEGATIVE Final    Comment: (NOTE) SARS-CoV-2 target nucleic  acids are NOT DETECTED. The SARS-CoV-2 RNA is generally detectable in upper and lower respiratory specimens during the acute phase of infection. Negative results do not preclude SARS-CoV-2 infection, do not rule out co-infections with other pathogens, and should not be used as the sole basis for treatment or other patient management decisions. Negative results must be combined with clinical observations, patient history, and epidemiological information. The expected result is Negative. Fact Sheet for Patients: SugarRoll.be Fact Sheet for Healthcare Providers: https://www.woods-mathews.com/ This test is not yet approved or cleared by the [place] FDA and  has been authorized for detection and/or diagnosis of SARS-CoV-2 by FDA under an Emergency Use Authorization (EUA). This EUA will remain  in effect (meaning this test can be used) for the duration of the COVID-19 declaration under Section 56 4(b)(1) of the Act, 21 U.S.C. section 360bbb-3(b)(1), unless the authorization is terminated or revoked sooner. Performed at [facility], [place] [address], [place],  [address]      Radiological Exams on Admission: Dg Abd 2 Views  Result Date: 06/13/2019 CLINICAL DATA:  Pancreatic cancer. Partial gastric outlet obstruction on EGD. Nausea and vomiting. EXAM: ABDOMEN - 2 VIEW COMPARISON:  May 20, 2019 FINDINGS: The common bile duct stent is stable. Pneumobilia is seen as expected. No other acute abnormalities. IMPRESSION: Stable common bile duct stent and pneumobilia. No other acute abnormalities. Electronically Signed   By: Dorise Bullion III M.D   On: 06/13/2019 15:07   EKG: No EKG done on Admission so will order one now.  Assessment/Plan Active Problems:   Generalized weakness   Abnormal finding on GI tract imaging   Pancreatic mass   Hypothyroidism   Malignant neoplasm of head of pancreas (HCC)   Personal history of breast cancer    Intractable nausea and vomiting  Intractable Nausea and Vomiting of GOO from Extrinsic compression from Pancreatic Cancer -Admit to Inpatient Telemetry -NPO for now -Place NG Tube and have LIWS -Give 500 mL Bolus of NS and Start Maintenance IVF with NS at rate of 100 mL/hr -Recently had an EUS with placement of fiducial markers in the pancreatic head mass and there is partial gastric outlet obstruction noted related to pancreatic mass -Consult GI for further evaluation and possible placement of stent -We will notify Dr. [name] of  patient's admission -SARS-CoV-2 testing is pending however do not feel like she has a recently tested negative -Further evaluation by gastroenterology and appreciate management -Continue antiemetics with IV and p.o. Zofran and will add IV Phenergan as needed if necessary -Check Lipase Level   Tachycardia in the setting of Dehydration/Hx of Dysrythmia -Resume Home Cardizem once Verified by Pharmacy -C/w IVF Hydration -Check EKG  Hypokalemia -Patient's K+ was 3.2 -Check Mag Level; Replete with IV KCl 40 mEQ -Continue to Monitor and Replete as Necessary -Repeat CMP in AM   Hyperglycemia -Check HbA1c -Continue to Monitor Blood Sugars Carefully -If Necessary Will place on Sensitive Novolog SSI   Anxiety -C/w Mirtazapine 7.5 mg po qHS  Hypothyroidism -Check TSH -C/w Levothyroxine 50 mcg but will change to IV Dose  Pancreatic cancer recently diagnosed in May 2020 status post 2 cycles of FOLFIRINOX -Has Hx of Breast Cancer in 2000 with Lumpectomy and Radiation  -With poor response and discontinued after cycle 2 -Has been referred for SBRT simulation planning on 06/16/2019 and may be able to be done while she is hospitalized here now -Will notify Dr. [name]  **Of note by the time I did this admission the med rec was not done by pharmacy and will need to adjust medications once confirmed by pharmacy  DVT prophylaxis: Heparin 5,000 units sq q8h Code Status:  FULL CODE Family Communication: No family present at bedside  Disposition Plan: Pending GI work up and Tolerance of Diet Consults called: Gastroenterology; Medical Oncology Admission status: Inpatient Telemetry   Severity of Illness: The appropriate patient status for this patient is INPATIENT. Inpatient status is judged to be reasonable and necessary in order to provide the required intensity of service to ensure the patient's safety. The patient's presenting symptoms, physical exam findings, and initial radiographic and laboratory data in the context of their chronic comorbidities is felt to place them at high risk for further clinical deterioration. Furthermore, it is not anticipated that the patient will be medically stable for discharge from the hospital within 2 midnights of admission. The following factors support the patient status of inpatient.   " The patient's presenting symptoms include Nausea and Vomiting. " The worrisome physical exam findings include Dehydration and Dry MM. " The initial radiographic and laboratory data are worrisome because of Gastric Outlet Obstruction and Hypokalemia " The chronic co-morbidities are listed as above.  * I certify that at the point of admission it is my clinical judgment that the patient will require inpatient hospital care spanning beyond 2 midnights from the point of admission due to high intensity of service, high risk for further deterioration and high frequency of surveillance required.Kerney Elbe, D.O. [facility] PAGER is on [place]  If 7PM-7AM, please contact night-coverage [email or web address] Password [email or web address]  06/13/2019, 6:12 PM

## 2019-06-14 ENCOUNTER — Inpatient Hospital Stay (HOSPITAL_COMMUNITY): Payer: Medicare HMO

## 2019-06-14 DIAGNOSIS — R112 Nausea with vomiting, unspecified: Secondary | ICD-10-CM

## 2019-06-14 DIAGNOSIS — R531 Weakness: Secondary | ICD-10-CM

## 2019-06-14 DIAGNOSIS — R933 Abnormal findings on diagnostic imaging of other parts of digestive tract: Secondary | ICD-10-CM

## 2019-06-14 DIAGNOSIS — C250 Malignant neoplasm of head of pancreas: Secondary | ICD-10-CM

## 2019-06-14 DIAGNOSIS — K311 Adult hypertrophic pyloric stenosis: Secondary | ICD-10-CM

## 2019-06-14 DIAGNOSIS — K8689 Other specified diseases of pancreas: Secondary | ICD-10-CM

## 2019-06-14 DIAGNOSIS — D649 Anemia, unspecified: Secondary | ICD-10-CM

## 2019-06-14 DIAGNOSIS — R9431 Abnormal electrocardiogram [ECG] [EKG]: Secondary | ICD-10-CM

## 2019-06-14 LAB — COMPREHENSIVE METABOLIC PANEL
ALT: 22 U/L (ref 0–44)
AST: 22 U/L (ref 15–41)
Albumin: 3 g/dL — ABNORMAL LOW (ref 3.5–5.0)
Alkaline Phosphatase: 99 U/L (ref 38–126)
Anion gap: 12 (ref 5–15)
BUN: 20 mg/dL (ref 8–23)
CO2: 30 mmol/L (ref 22–32)
Calcium: 8.4 mg/dL — ABNORMAL LOW (ref 8.9–10.3)
Chloride: 100 mmol/L (ref 98–111)
Creatinine, Ser: 0.66 mg/dL (ref 0.44–1.00)
GFR calc Af Amer: >60 mL/min (ref >60)
GFR calc non Af Amer: >60 mL/min (ref >60)
Glucose, Bld: 96 mg/dL (ref 70–99)
Potassium: 3.5 mmol/L (ref 3.5–5.1)
Sodium: 142 mmol/L (ref 135–145)
Total Bilirubin: 0.5 mg/dL (ref 0.3–1.2)
Total Protein: 5.4 g/dL — ABNORMAL LOW (ref 6.5–8.1)

## 2019-06-14 LAB — MAGNESIUM: Magnesium: 2.1 mg/dL (ref 1.7–2.4)

## 2019-06-14 LAB — PHOSPHORUS: Phosphorus: 4 mg/dL (ref 2.5–4.6)

## 2019-06-14 LAB — GLUCOSE, CAPILLARY: Glucose-Capillary: 76 mg/dL (ref 70–99)

## 2019-06-14 LAB — CBC
HCT: 33.9 % — ABNORMAL LOW (ref 36.0–46.0)
Hemoglobin: 11 g/dL — ABNORMAL LOW (ref 12.0–15.0)
MCH: 32 pg (ref 26.0–34.0)
MCHC: 32.4 g/dL (ref 30.0–36.0)
MCV: 98.5 fL (ref 80.0–100.0)
Platelets: 253 10*3/uL (ref 150–400)
RBC: 3.44 MIL/uL — ABNORMAL LOW (ref 3.87–5.11)
RDW: 13.6 % (ref 11.5–15.5)
WBC: 7.4 10*3/uL (ref 4.0–10.5)
nRBC: 0 % (ref 0.0–0.2)

## 2019-06-14 LAB — CANCER ANTIGEN 19-9: CA 19-9: 500 U/mL — ABNORMAL HIGH (ref 0–35)

## 2019-06-14 MED ORDER — DEXTROSE 50 % IV SOLN
12.5000 g | INTRAVENOUS | Status: AC
  Administered 2019-06-14: 12.5 g via INTRAVENOUS
  Filled 2019-06-14: qty 50

## 2019-06-14 MED ORDER — SODIUM CHLORIDE 0.9 % IV SOLN
INTRAVENOUS | Status: DC
  Administered 2019-06-14 (×2): via INTRAVENOUS

## 2019-06-14 MED ORDER — POTASSIUM CHLORIDE 10 MEQ/100ML IV SOLN
10.0000 meq | Freq: Once | INTRAVENOUS | Status: AC
  Administered 2019-06-14: 10 meq via INTRAVENOUS
  Filled 2019-06-14 (×2): qty 100

## 2019-06-14 MED ORDER — METOPROLOL TARTRATE 5 MG/5ML IV SOLN
2.5000 mg | Freq: Two times a day (BID) | INTRAVENOUS | Status: DC
  Administered 2019-06-14 – 2019-06-23 (×19): 2.5 mg via INTRAVENOUS
  Filled 2019-06-14 (×19): qty 5

## 2019-06-14 MED ORDER — IOPAMIDOL (ISOVUE-300) INJECTION 61%
310.0000 mL | Freq: Once | INTRAVENOUS | Status: AC | PRN
  Administered 2019-06-14: 310 mL via ORAL

## 2019-06-14 MED ORDER — POTASSIUM CHLORIDE 10 MEQ/100ML IV SOLN
10.0000 meq | INTRAVENOUS | Status: AC
  Administered 2019-06-14 (×4): 10 meq via INTRAVENOUS
  Filled 2019-06-14 (×4): qty 100

## 2019-06-14 NOTE — Progress Notes (Signed)
Advanced the NG tube 4 cm per xray recommendation. Pt tolerated well.

## 2019-06-14 NOTE — Progress Notes (Signed)
NG Tube clamped for PT/OT and down to Xray for UGI. Clamped for a total of 2 hours. No NV/Abdominal pain. Flushed NGT with 6mL tap water and reconnected.

## 2019-06-14 NOTE — Evaluation (Signed)
SLP Cancellation Note  Patient Details Name: Julie Rollins MRN: [id number] DOB: May 04, 1946   Cancelled treatment:       Reason Eval/Treat Not Completed: (pt npo at this time with NG in place and in xray at this time)   Macario Golds 06/14/2019, 12:59 PM   Luanna Salk, [place] [facility] SLP [place] Pager [phone] Office [phone]

## 2019-06-14 NOTE — Progress Notes (Signed)
HEMATOLOGY-ONCOLOGY PROGRESS NOTE  SUBJECTIVE: The patient was directly admitted from the cancer center due to a 2 to 3-week history of nausea and vomiting.  She was unable to eat or take medications.  She was also having abdominal discomfort and bloating.  An NG tube was placed.  The patient reports improvement in her abdominal discomfort and resolution of her nausea and vomiting.  Reports a small bowel movement earlier this morning.  Denies chest discomfort and shortness of breath.  The patient has been evaluated by GI who recommends an upper GI series.  Patient may require duodenal stenting at some point.  Oncology History  Malignant neoplasm of head of pancreas ([place])  03/28/2019 Imaging   CT AP w contrast IMPRESSION: Interval development of 20 x 14 mm ill-defined low density in pancreatic head concerning for possible neoplasm or malignancy. This appears to be resulting in severe intrahepatic and extrahepatic biliary dilatation. ERCP is recommended for further evaluation.  Sigmoid diverticulosis without inflammation.   03/29/2019 Initial Biopsy   Diagnosis FINE NEEDLE ASPIRATION, ENDOSCOPIC EGUS, PANCREATIC HEAD MASS (SPECIMEN 1 OF 2 COLLECTED 03/29/2019) MALIGNANT CELLS CONSISTENT WITH ADENOCARCINOMA.   03/29/2019 Initial Biopsy   Diagnosis Duodenum, Biopsy, abnormal mucosa at major papilla - ADENOCARCINOMA. SEE NOTE Diagnosis Note The duodenal mucosa is negative for dysplasia. The findings are consistent with duodenal involvement from patient's suspected primary pancreatic adenocarcinoma. Dr. Melina Copa has reviewed this case and concurs with the above interpretation. Dr. Ardis Hughs was notified on 03/30/2019.   03/29/2019 Pathology Results   Diagnosis BILE DUCT BRUSHING (SPECIMEN 2 OF 2, COLLECTED ON 03/29/2019): HIGHLY SUSPICIOUS FOR MALIGNANCY.   03/29/2019 Procedure   ERCP with stenting of malignant distal CBD stricture per Dr. Ardis Hughs   04/04/2019 Initial Diagnosis   Malignant neoplasm of head  of pancreas ([place])   04/08/2019 Imaging   CT Chest IMPRESSION: No evidence of metastatic disease in the chest. Aortic Atherosclerosis (ICD10-I70.0).    04/13/2019 - 05/10/2019 Chemotherapy   neoadjuvant FOLFIRINOX q3weeks beginning 04/13/19; dose-reduced with cycle 1   06/08/2019 Genetic Testing   Positive genetic testing. 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 The Common Hereditary Cancers Panel offered by [facility] includes sequencing and/or deletion duplication testing of the following 48 genes: APC, ATM, AXIN2, BARD1, BMPR1A, BRCA1, BRCA2, BRIP1, CDH1, CDKN2A (p14ARF), CDKN2A (p16INK4a), CKD4, CHEK2, CTNNA1, DICER1, EPCAM (Deletion/duplication testing only), GREM1 (promoter region deletion/duplication testing only), KIT, MEN1, MLH1, MSH2, MSH3, MSH6, MUTYH, NBN, NF1, NHTL1, PALB2, PDGFRA, PMS2, POLD1, POLE, PTEN, RAD50, RAD51C, RAD51D, RNF43, SDHB, SDHC, SDHD, SMAD4, SMARCA4. STK11, TP53, TSC1, TSC2, and VHL.  The following genes were evaluated for sequence changes only: SDHA and HOXB13 c.251G>A variant only. The report date is 06/08/2019.      REVIEW OF SYSTEMS:   Constitutional: Denies fevers, chills  Respiratory: Denies cough, dyspnea or wheezes Cardiovascular: Denies palpitation, chest discomfort Gastrointestinal: Abdominal pain, nausea, vomiting improved since NG tube placement Neurological:Denies numbness, tingling or new weaknesses Behavioral/Psych: Mood is stable, no new changes  Extremities: No lower extremity edema All other systems were reviewed with the patient and are negative.  I have reviewed the past medical history, past surgical history, social history and family history with the patient and they are unchanged from previous note.   PHYSICAL EXAMINATION:  Vitals:   06/14/19 0441 06/14/19 1413  BP: 123/65 (!) 130/55  Pulse: (!) 101 91  Resp: 16 16  Temp: 97.6 F (36.4 C) 97.7 F (36.5  C)  SpO2: 96%  99%   Filed Weights   06/13/19 1933 06/14/19 0441  Weight: 118 lb 6.4 oz (53.7 kg) 114 lb 13.8 oz (52.1 kg)    Intake/Output from previous day: 07/20 0701 - 07/21 0700 In: 921 [I.V.:600; IV Piggyback:321] Out: 1650 [Urine:100; Emesis/NG output:1550]  GENERAL: Thin, chronically ill female.  Alert, no distress and comfortable LYMPH:  no palpable lymphadenopathy in the cervical, axillary or inguinal LUNGS: clear to auscultation and percussion with normal breathing effort HEART: regular rate & rhythm and no murmurs and no lower extremity edema ABDOMEN:abdomen soft, non-tender and normal bowel sounds Musculoskeletal:no cyanosis of digits and no clubbing  NEURO: alert & oriented x 3 with fluent speech, no focal motor/sensory deficits  LABORATORY DATA:  I have reviewed the data as listed CMP Latest Ref Rng & Units 06/14/2019 06/13/2019 06/13/2019  Glucose 70 - 99 mg/dL 96 121(H) -  BUN 8 - 23 mg/dL 20 17 -  Creatinine 0.44 - 1.00 mg/dL 0.66 0.72 0.72  Sodium 135 - 145 mmol/L 142 142 -  Potassium 3.5 - 5.1 mmol/L 3.5 3.2(L) -  Chloride 98 - 111 mmol/L 100 98 -  CO2 22 - 32 mmol/L 30 32 -  Calcium 8.9 - 10.3 mg/dL 8.4(L) 9.9 -  Total Protein 6.5 - 8.1 g/dL 5.4(L) 6.7 -  Total Bilirubin 0.3 - 1.2 mg/dL 0.5 0.4 -  Alkaline Phos 38 - 126 U/L 99 142(H) -  AST 15 - 41 U/L 22 25 -  ALT 0 - 44 U/L 22 26 -    Lab Results  Component Value Date   WBC 7.4 06/14/2019   HGB 11.0 (L) 06/14/2019   HCT 33.9 (L) 06/14/2019   MCV 98.5 06/14/2019   PLT 253 06/14/2019   NEUTROABS 5.4 06/13/2019    Dg Abd 1 View  Result Date: 06/13/2019 CLINICAL DATA:  NG tube placement. EXAM: ABDOMEN - 1 VIEW COMPARISON:  Radiograph yesterday. CT 05/20/2019 FINDINGS: Tip of the enteric tube below the diaphragm in the stomach, side-port in the region of the gastroesophageal junction. Mild gaseous gastric distension. No other bowel dilatation. Biliary stent in the right upper quadrant with  pneumobilia. No free air. IMPRESSION: Tip of the enteric tube below the diaphragm in the stomach, side-port in the region of the gastroesophageal junction. Advancement of 3-5 cm would lead to optimal placement. Electronically Signed   By: Melanie  Sanford M.D.   On: 06/13/2019 19:59   Ct Abdomen Pelvis W Contrast  Result Date: 05/20/2019 CLINICAL DATA:  Ongoing chemotherapy for pancreatic cancer. Stent placement. Loss of appetite. Left lower quadrant abdominal pain. EXAM: CT ABDOMEN AND PELVIS WITH CONTRAST TECHNIQUE: Multidetector CT imaging of the abdomen and pelvis was performed using the standard protocol following bolus administration of intravenous contrast. CONTRAST:  100mL OMNIPAQUE IOHEXOL 300 MG/ML  SOLN COMPARISON:  03/28/2019 FINDINGS: Lower chest: Stable scarring in both lung bases. Stable lipoma in the left latissimus dorsi muscle. Hepatobiliary: Diffuse hepatic steatosis. 0.7 by 0.6 cm lesion in the right hepatic lobe on image 44/2 with arterial phase enhancement and delayed phase enhancement, no change from 11/19/2006, compatible with benign process such as hemangioma. Mild wall thickening of the gallbladder noted. A common bile duct expandable stent is present extending into the duodenum. Pneumobilia noted. Pancreas: Minimally dilated dorsal pancreatic duct. Indistinctly marginated hypodensity compatible with infiltrative pancreatic neoplasm measures approximately 1.9 by 1.6 cm on image 48/2. This had a slightly different configuration previously and by my measurement was previously about 1.7 by 1.2 cm. The amount   of dorsal pancreatic duct dilatation is mildly increased. No appreciable involvement of regional vasculature Spleen: Unremarkable Adrenals/Urinary Tract: Small cyst of the left kidney lower pole. Hypodense lesion in the right mid kidney measuring 4 mm in diameter on image 51/5 is technically too small to characterize although statistically likely to be a cyst. Adrenal glands  unremarkable. Urinary bladder is empty during imaging. Stomach/Bowel: Sigmoid colon diverticulosis. Vascular/Lymphatic: Conventional proper hepatic artery arising from the celiac trunk. No appreciable major vascular involvement by the pancreatic tumor. Small porta hepatis and peripancreatic lymph nodes are not pathologically enlarged. Reproductive: Uterus absent.  Adnexa unremarkable. Other: Mild stranding of the mesenteric adipose tissue ("misty mesentery") is present. This can be idiopathic, due to adjacent inflammation such as enteritis, from mild sclerosing mesenteritis, or rarely from other conditions such as cirrhosis, mesenteric lymphoma, or mesenteric venous thrombosis. 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 Musculoskeletal: Degenerative hip arthropathy bilaterally. Sternal deformity from an old fracture. Stable wedge compression fracture at T12. Grade 1 degenerative anterolisthesis at L4-5. IMPRESSION: 1. Mild increase in size of the infiltrative pancreatic head malignancy currently 1.9 by 1.6 cm, previously 1.7 by 1.2 cm by my measurements. No direct major vascular involvement or distant metastatic spread identified. The margin of the tumor is traversed by metallic stent, with resulting pneumobilia. Mildly increased dorsal pancreatic duct dilatation. 2. Diffuse hepatic steatosis. 3. Small hemangioma inferiorly in the right hepatic lobe, stable from 2007. 4. Mild wall thickening of the gallbladder, nonspecific. 5. 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 6. Other imaging findings of potential clinical significance: Sigmoid colon diverticulosis. Degenerative hip arthropathy bilaterally. Old healed sternal fracture. Stable wedge compression fracture at T12. Grade 1 anterolisthesis at L4-5. Electronically Signed   By: Walter  Liebkemann M.D.   On: 05/20/2019 08:55   Dg Abd 2 Views  Result Date: 06/13/2019 CLINICAL DATA:   Pancreatic cancer. Partial gastric outlet obstruction on EGD. Nausea and vomiting. EXAM: ABDOMEN - 2 VIEW COMPARISON:  May 20, 2019 FINDINGS: The common bile duct stent is stable. Pneumobilia is seen as expected. No other acute abnormalities. IMPRESSION: Stable common bile duct stent and pneumobilia. No other acute abnormalities. Electronically Signed   By: David  Williams III M.D   On: 06/13/2019 15:07   Dg Ugi W Single Cm (sol Or Thin Ba)  Result Date: 06/14/2019 CLINICAL DATA:  Gastric outlet obstruction? History of pancreatic cancer. EXAM: UPPER GI SERIES WITHOUT KUB TECHNIQUE: Routine upper GI series was performed with water soluble barium. FLUOROSCOPY TIME:  Fluoroscopy Time:  2 minutes and 1 second 14.4 mGy COMPARISON:  None. FINDINGS: Contrast was administered into the stomach via the indwelling nasogastric tube. Stomach was adequately distended with contrast revealing no mass, ulceration or other wall irregularity. Contrast passed through the normal caliber duodenum without evidence of obstruction or dysmotility. Incidental note made of reflux of contrast into the biliary system, presumably related to the recent stent placement and presumed sphincterotomy. IMPRESSION: 1. No gastric outlet obstruction. 2. No evidence of duodenal obstruction. Electronically Signed   By: Stan  Maynard M.D.   On: 06/14/2019 14:13    ASSESSMENT AND PLAN: 1.  Pancreatic adenocarcinoma 2.  Partial gastric outlet obstruction 3.  Nausea and vomiting 4.  Hypokalemia 5.  Mild anemia   -Currently on supportive care for her pancreatic cancer.  Under consideration to receive SBRT prior to surgical resection.  We will notify radiation oncology of admission. -Upper GI series ordered by gastroenterology.  Appreciate   their assistance. -Continue NG tube and antiemetics as needed. -Hypokalemia has improved.  Recommend continued monitoring.  Replete as needed. -The patient has mild anemia which is likely dilutional.   Additional work-up has been ordered including vitamin B12 level, folate, ferritin, iron studies, reticulocytes.  I will update Dr. Feng regarding the patient's admission.  She will return to the office on 06/15/2019.   LOS: 1 day   Kristin Curcio, DNP, AGPCNP-BC, AOCNP 06/14/19 

## 2019-06-14 NOTE — Evaluation (Signed)
Occupational Therapy Evaluation Patient Details Name: Julie Rollins MRN: [id number] DOB: 1945-12-20 Today's Date: 06/14/2019    History of Present Illness 73 y.o. female with PMH hypothyroidism,  of breath cancer s/p lumpectomy and radiation in 2000, HTN, and cancer of pancreatic head diagnosed in early May after presenting to hospital with jaundice s/p ERCP with CBD stenting 03/29/19.    Clinical Impression   Pt admitted with the above diagnoses and presents with below problem list. Pt will benefit from continued acute OT to address the below listed deficits and maximize independence with basic ADLs prior to d/c home. At baseline, pt is independent with basic ADLs, recently has been struggling with physical demands of instrumental ADLs (meal prep, housecleaning, etc). Pt is currently supervision to min guard with functional transfers, mobility in the room, and LB ADLs, setup for UB ADLs.      Follow Up Recommendations  Home health OT;Supervision - Intermittent    Equipment Recommendations  3 in 1 bedside commode    Recommendations for Other Services       Precautions / Restrictions Precautions Precautions: Fall Restrictions Weight Bearing Restrictions: No      Mobility Bed Mobility Overal bed mobility: Modified Independent                Transfers Overall transfer level: Needs assistance Equipment used: None Transfers: Sit to/from Stand Sit to Stand: Supervision         Rollins transfer comment: to/from EOB and recliner. min guard for safety    Balance Overall balance assessment: Needs assistance Sitting-balance support: No upper extremity supported;Feet supported Sitting balance-Leahy Scale: Good     Standing balance support: No upper extremity supported;During functional activity Standing balance-Leahy Scale: Fair Standing balance comment: good static standing, fair dynamic standing tasks outside BOS                           ADL either  performed or assessed with clinical judgement   ADL Overall ADL's : Needs assistance/impaired Eating/Feeding: NPO   Grooming: Min guard;Standing;Supervision/safety;Wash/dry hands   Upper Body Bathing: Set up;Sitting   Lower Body Bathing: Min guard;Sit to/from stand   Upper Body Dressing : Set up;Sitting   Lower Body Dressing: Min guard;Sit to/from stand   Toilet Transfer: Min guard;Ambulation   Toileting- Clothing Manipulation and Hygiene: Min guard;Set up;Sitting/lateral lean;Sit to/from stand   Tub/ Shower Transfer: Tub transfer;Min guard;Ambulation   Functional mobility during ADLs: Supervision/safety;Min guard Rollins ADL Comments: Pt completed in room functional mobility including navigating turns, obstacles and dynamic tasks. Pt stood at sink to complete grooming task.     Vision Baseline Vision/History: Wears glasses       Perception     Praxis      Pertinent Vitals/Pain Pain Assessment: Faces Faces Pain Scale: Hurts a little bit Pain Location: nasal canal and sinus area, "behind my eye but I think it's where they put that thing in" Pain Descriptors / Indicators: Guarding Pain Intervention(s): Monitored during session;Limited activity within patient's tolerance     Hand Dominance     Extremity/Trunk Assessment Upper Extremity Assessment Upper Extremity Assessment: Overall WFL for tasks assessed;Generalized weakness   Lower Extremity Assessment Lower Extremity Assessment: Defer to PT evaluation       Communication Communication Communication: No difficulties   Cognition Arousal/Alertness: Awake/alert Behavior During Therapy: WFL for tasks assessed/performed Overall Cognitive Status: Within Functional Limits for tasks assessed  Rollins Comments       Exercises     Shoulder Instructions      Home Living Family/patient expects to be discharged to:: Private residence Living Arrangements:  Alone Available Help at Discharge: Friend(s);Available PRN/intermittently Type of Home: Apartment Home Access: Level entry     Home Layout: One level     Bathroom Shower/Tub: Tub/shower unit         Home Equipment: Grab bars - tub/shower;Walker - 4 wheels          Prior Functioning/Environment Level of Independence: Independent        Comments: Living alone and independent with ADL. Struggles with physical demand of IADL tasks. Takeout in lieu of cooking.        OT Problem List: Decreased strength;Decreased activity tolerance;Impaired balance (sitting and/or standing);Decreased knowledge of use of DME or AE;Decreased knowledge of precautions;Pain      OT Treatment/Interventions: Self-care/ADL training;Therapeutic activities;Therapeutic exercise;Energy conservation;DME and/or AE instruction;Balance training;Patient/family education    OT Goals(Current goals can be found in the care plan section) Acute Rehab OT Goals Patient Stated Goal: feel better, regain independence, return home, return to dancing OT Goal Formulation: With patient Time For Goal Achievement: 06/28/19 Potential to Achieve Goals: Good ADL Goals Pt Will Perform Grooming: with modified independence;standing;sitting Pt Will Perform Lower Body Bathing: with modified independence;sit to/from stand Pt Will Perform Lower Body Dressing: with modified independence;sit to/from stand Pt Will Transfer to Toilet: Independently;with modified independence;ambulating Pt Will Perform Toileting - Clothing Manipulation and hygiene: Independently;with modified independence;sit to/from stand Pt Will Perform Tub/Shower Transfer: with modified independence;ambulating Pt/caregiver will Perform Home Exercise Program: Increased strength;Both right and left upper extremity;With written HEP provided;Independently  OT Frequency: Min 2X/week   Barriers to D/C: Decreased caregiver support  PRN assist from friends        Co-evaluation PT/OT/SLP Co-Evaluation/Treatment: Yes Reason for Co-Treatment: To address functional/ADL transfers;Complexity of the patient's impairments (multi-system involvement)   OT goals addressed during session: ADL's and self-care;Strengthening/ROM;Proper use of Adaptive equipment and DME      AM-PAC OT "6 Clicks" Daily Activity     Outcome Measure Help from another person eating meals?: None(NPO, but otherwise would be Independent) Help from another person taking care of personal grooming?: None Help from another person toileting, which includes using toliet, bedpan, or urinal?: A Little Help from another person bathing (including washing, rinsing, drying)?: A Little Help from another person to put on and taking off regular upper body clothing?: None Help from another person to put on and taking off regular lower body clothing?: A Little 6 Click Score: 21   End of Session    Activity Tolerance: Patient tolerated treatment well;Patient limited by fatigue Patient left: in chair;with call bell/phone within reach  OT Visit Diagnosis: Unsteadiness on feet (R26.81);Muscle weakness (generalized) (M62.81);Pain                Time: 1118-1140 OT Time Calculation (min): 22 min Charges:  OT Rollins Charges $OT Visit: 1 Visit OT Evaluation $OT Eval Low Complexity: [place], OT Acute Rehabilitation Services Pager: [phone] Office: [phone]   Hortencia Pilar 06/14/2019, 12:01 PM

## 2019-06-14 NOTE — Consult Note (Addendum)
Referring Provider:  [facility]         Primary Care Physician:  Nolene Ebbs, MD Primary Gastroenterologist: Denzil Magnuson, MD            Reason for Consultation:   Intractable nausea / vomiting                ASSESSMENT /  PLAN    1. 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 She is s/p biliary stent placement. Didn't tolerated chemotherapy but to start radiation this week. No distant metastasis, plan is for resection.   2. Nausea / vomiting, probably secondary to partial GOO ( duodenal edema on endoscopy 06/09/19). She is feeling better with IVF and gastric decompression. Didn't tolerate chemo but suppose to start radiation this week.  -obtain UGI series  -Depending on clinical course she could need a duodenal stent at some point.     BRIEF HISTORY   SHAKEELA RABADAN is a 73 y.o. female with PMH hypothyroidism,  of breath cancer s/p lumpectomy and radiation in 2000, HTN, and cancer of pancreatic head diagnosed in early May after presenting to hospital with jaundice s/p ERCP with CBD stenting 03/29/19. No distant metastasis, plan is for resection and neoadjuvant chemotherapy. She started but didn't tolerated chemotherapy. Patient has been struggling for days with N/V. Seen in ED 06/12/19. Had Oncology appt yesterday and from there was directed admitted unable to tolerate PO.   HPI:   Ms. Gaetz started neoadjuvant FOLFIRINOX 04/13/19. After second dose she had a reaction and drug discontinued. She is scheduled to start prer-op radiation 06/16/19. She has been struggling for days with nausea /vomiting but it got significantly worse following EUS on 06/09/19. Unable to hold anything down since. No abdominal pain. A few weeks ago she had some loose stool but that resolved.  Abdominal films following NGT placement basically unremarkable, NGT was advanced as recommended. Bile duct stent in place on xray. Patient feels better today after getting  IVF and gastric decompression. Overall weight is stable.   Data Reviewed:  Wbc normal, Hgb 11, albumin 3, liver tests normal. Renal function normal.   abd xray - mild gastric distention, no bowel dilation. Enteric tube just below diaphragm   PREVIOUS GASTROINTESTINAL STUDIES   06/09/19 EUS - placement of fiducial markers into pancreatic head mass. Significant edema and incomplete luminal narrowing in the distal duodenal bulb.    03/29/19 EUS Dr. Ardis Hughs  Severe, friable, reflux related esophagitis. She should be on BID PPI for now. - 2.9cm by 1.3cm mass in the head of pancreas causing biliary obstruction but not involving the main pancreatic duct or any significant vascular structures. The mass is involving the periampullary duodenal mucosa (actual invasion or just edema related to the underlying mass). - No peripancreatic adenopathy. - Low cystic duct takeoff; likely obstructed cystic duct based on dilation of the duct and dilated gallbladder.  03/29/19 ERCP The major papilla was protuberant and the mucosa just proximal to it was very irregular, friable, slightly ulcerated (over 1-2cm). Biopsies were taken with forceps of the abnormal mucosa and also from yellowish pancreatic parenchyma that was deep to this mucosa and was exposed following biliary sphincterotomy. - Malignant appearing distal CBD stricture, sampled with biliary brushing and then stented with a 4cm long 42mm diameter fully covered metal stent.  Past Medical History:  Diagnosis Date  . Anxiety   . Blood transfusion without reported diagnosis   . Breast CA ([place])  s/p lumpectomy and radiation 2000  . Breast cancer ([place])   . Colitis    2007  . Dyspnea   . Dysrhythmia   . Family history of bladder cancer   . Family history of stomach cancer   . Family history of throat cancer   . Hernia, epigastric   . HTN (hypertension)    "mild hypertension", was on BP med years ago but it caused orthostatic hypotension and she  has not been medicated since  . Hypothyroid   . Pancreatic cancer ([place]) dx'd 03/2019  . Personal history of breast cancer   . Personal history of radiation therapy 2001  . Vertigo     Past Surgical History:  Procedure Laterality Date  . ABDOMINAL HYSTERECTOMY     108yrs ago  . ANTERIOR CERVICAL DECOMP/DISCECTOMY FUSION N/A 08/02/2018   Procedure: ANTERIOR CERVICAL DECOMPRESSION/DISCECTOMY FUSION CERVICAL FIVE- CERVICAL SIX;  Surgeon: Earnie Larsson, MD;  Location: [place];  Service: Neurosurgery;  Laterality: N/A;  ANTERIOR CERVICAL DECOMPRESSION/DISCECTOMY FUSION CERVICAL FIVE- CERVICAL SIX  . BILIARY BRUSHING  03/29/2019   Procedure: BILIARY BRUSHING;  Surgeon: [name], MD;  Location: [facility] ENDOSCOPY;  Service: Endoscopy;;  . BILIARY STENT PLACEMENT  03/29/2019   Procedure: BILIARY STENT PLACEMENT;  Surgeon: [name], MD;  Location: [facility] ENDOSCOPY;  Service: Endoscopy;;  . BIOPSY  03/29/2019   Procedure: BIOPSY;  Surgeon: [name], MD;  Location: [facility] ENDOSCOPY;  Service: Endoscopy;;  . BREAST LUMPECTOMY     2000  . ERCP N/A 03/29/2019   Procedure: ENDOSCOPIC RETROGRADE CHOLANGIOPANCREATOGRAPHY (ERCP);  Surgeon: [name], MD;  Location: [facility] ENDOSCOPY;  Service: Endoscopy;  Laterality: N/A;  . ESOPHAGOGASTRODUODENOSCOPY (EGD) WITH PROPOFOL N/A 03/29/2019   Procedure: ESOPHAGOGASTRODUODENOSCOPY (EGD) WITH PROPOFOL;  Surgeon: [name], MD;  Location: [facility] ENDOSCOPY;  Service: Endoscopy;  Laterality: N/A;  . ESOPHAGOGASTRODUODENOSCOPY (EGD) WITH PROPOFOL N/A 06/09/2019   Procedure: ESOPHAGOGASTRODUODENOSCOPY (EGD) WITH PROPOFOL;  Surgeon: [name], MD;  Location: [facility] ENDOSCOPY;  Service: Endoscopy;  Laterality: N/A;  . EUS N/A 03/29/2019   Procedure: UPPER ENDOSCOPIC ULTRASOUND (EUS) LINEAR;  Surgeon: [name], MD;  Location: [facility] ENDOSCOPY;  Service: Endoscopy;  Laterality: N/A;  . EUS N/A 06/09/2019   Procedure: UPPER ENDOSCOPIC ULTRASOUND (EUS) LINEAR;  Surgeon:  [name], MD;  Location: [facility] ENDOSCOPY;  Service: Endoscopy;  Laterality: N/A;  . FIDUCIAL MARKER PLACEMENT  06/09/2019   Procedure: FIDUCIAL MARKER PLACEMENT;  Surgeon: [name], MD;  Location: [facility] ENDOSCOPY;  Service: Endoscopy;;  . FINE NEEDLE ASPIRATION  03/29/2019   Procedure: FINE NEEDLE ASPIRATION (FNA);  Surgeon: [name], MD;  Location: [facility] ENDOSCOPY;  Service: Endoscopy;;  . HERNIA REPAIR     20+ years ago  . PORTACATH PLACEMENT N/A 04/12/2019   Procedure: INSERTION PORT-A-CATH LEFT SUBCLAVIAN;  Surgeon: Stark Klein, MD;  Location: [place];  Service: General;  Laterality: N/A;  . RETINAL DETACHMENT SURGERY    . SPHINCTEROTOMY  03/29/2019   Procedure: SPHINCTEROTOMY;  Surgeon: [name], MD;  Location: [facility] ENDOSCOPY;  Service: Endoscopy;;    Prior to Admission medications   Medication Sig Start Date End Date Taking? Authorizing Provider  diltiazem (CARDIZEM CD) 180 MG 24 hr capsule Take 180 mg by mouth daily. 05/18/18  Yes [provider]  levothyroxine (SYNTHROID, LEVOTHROID) 50 MCG tablet Take 50 mcg by mouth every morning.   Yes [provider]  mirtazapine (REMERON) 7.5 MG tablet Take 1 tablet (7.5 mg  total) by mouth at bedtime. 05/16/19  Yes Alla Feeling, NP  ondansetron (ZOFRAN-ODT) 8 MG disintegrating tablet Take 1 tablet (8 mg total) by mouth every 8 (eight) hours as needed for nausea or vomiting. 01/14/24  Yes Delora Fuel, MD  HYDROcodone-acetaminophen (NORCO/VICODIN) 5-325 MG tablet Take 0.5-1 tablets by mouth every 6 (six) hours as needed for moderate pain or severe pain. Patient not taking: Reported on 06/13/2019 04/12/19   Stark Klein, MD  prochlorperazine (COMPAZINE) 10 MG tablet Take 10 mg by mouth every 6 (six) hours as needed for nausea or vomiting.    [provider]  potassium chloride 20 MEQ/15ML (10%) SOLN Take 15 mLs (20 mEq total) by mouth daily. Patient not taking: Reported on 06/01/2019  05/04/19 06/12/19  Alla Feeling, NP    Current Facility-Administered Medications  Medication Dose Route Frequency Provider Last Rate Last Dose  . 0.9 %  sodium chloride infusion   Intravenous Continuous Raiford Noble [place], DO 100 mL/hr at 06/14/19 4270    . acetaminophen (TYLENOL) tablet 650 mg  650 mg Oral Q6H PRN Raiford Noble Latif, DO       Or  . acetaminophen (TYLENOL) suppository 650 mg  650 mg Rectal Q6H PRN Raiford Noble Latif, DO      . bisacodyl (DULCOLAX) suppository 10 mg  10 mg Rectal Daily PRN Alfredia Ferguson, Omair Latif, DO      . chlorhexidine (PERIDEX) 0.12 % solution 15 mL  15 mL Mouth Rinse BID Raiford Noble [place], DO   15 mL at 06/14/19 6237  . diltiazem (CARDIZEM CD) 24 hr capsule 180 mg  180 mg Oral Daily Sheikh, Georgina Quint [place], DO      . heparin injection 5,000 Units  5,000 Units Subcutaneous [address], Omair [place], DO   5,000 Units at 06/14/19 0500  . levothyroxine (SYNTHROID, LEVOTHROID) injection 25 mcg  25 mcg Intravenous Daily Raiford Noble [place], DO   25 mcg at 06/14/19 6283  . MEDLINE mouth rinse  15 mL Mouth Rinse q12n4p Sheikh, Omair Latif, DO      . mirtazapine (REMERON) tablet 7.5 mg  7.5 mg Oral QHS Sheikh, Omair Latif, DO      . ondansetron [facility]) tablet 4 mg  4 mg Oral Q6H PRN Raiford Noble Latif, DO       Or  . ondansetron [facility]) injection 4 mg  4 mg Intravenous Q6H PRN Raiford Noble [place], DO   4 mg at 06/13/19 1812  . phenol (CHLORASEPTIC) mouth spray 1 spray  1 spray Mouth/Throat PRN Sheikh, Omair Latif, DO      . senna-docusate (Senokot-S) tablet 1 tablet  1 tablet Oral QHS PRN Raiford Noble Latif, DO      . sodium chloride flush (NS) 0.9 % injection 10-40 mL  10-40 mL Intracatheter PRN Raiford Noble Latif, DO        Allergies as of 06/13/2019 - Review Complete 06/13/2019  Allergen Reaction Noted  . Codeine Nausea Only 02/12/2012    Family History  Problem Relation Age of Onset  . Bladder Cancer Brother   . Other Other        Denies family h/o  cardiac disease  . Stomach cancer Maternal Uncle   . Throat cancer Maternal Uncle     Social History   Socioeconomic History  . Marital status: Divorced    Spouse name: Not on file  . Number of children: Not on file  . Years of education: Not on file  . Highest education level:  Not on file  Occupational History  . Occupation: retired    Comment: [employer]   Social Needs  . Financial resource strain: Not on file  . Food insecurity    Worry: Not on file    Inability: Not on file  . Transportation needs    Medical: No    Non-medical: No  Tobacco Use  . Smoking status: Never Smoker  . Smokeless tobacco: Never Used  Substance and Sexual Activity  . Alcohol use: No  . Drug use: No  . Sexual activity: Not Currently  Lifestyle  . Physical activity    Days per week: Not on file    Minutes per session: Not on file  . Stress: Not on file  Relationships  . Social Herbalist on phone: Not on file    Gets together: Not on file    Attends religious service: Not on file    Active member of club or organization: Not on file    Attends meetings of clubs or organizations: Not on file    Relationship status: Not on file  . Intimate partner violence    Fear of current or ex partner: Not on file    Emotionally abused: Not on file    Physically abused: Not on file    Forced sexual activity: Not on file  Other Topics Concern  . Not on file  Social History Narrative   Lives alone    Review of Systems: All systems reviewed and negative except where noted in HPI.  Physical Exam: Vital signs in last 24 hours: Temp:  [97.4 F (36.3 C)-98.5 F (36.9 C)] 97.6 F (36.4 C) (07/21 0441) Pulse Rate:  [101-116] 101 (07/21 0441) Resp:  [16-24] 16 (07/21 0441) BP: (115-156)/(65-78) 123/65 (07/21 0441) SpO2:  [93 %-99 %] 96 % (07/21 0441) Weight:  [52.1 kg-53.7 kg] 52.1 kg (07/21 0441) Last BM Date: 06/11/19 General:   Alert, well-developed,  female in NAD. NGT tube  draining bilious colored fluid Psych:  Pleasant, cooperative. Normal mood and affect. Eyes:  Pupils equal, sclera clear, no icterus.   Conjunctiva pink. Ears:  Normal auditory acuity. Nose:  No deformity, discharge,  or lesions. Neck:  Supple; no masses Lungs:  Clear throughout to auscultation.   No wheezes, crackles, or rhonchi.  Heart:  Regular rate and rhythm; no murmurs, no lower extremity edema Abdomen:  Soft, non-distended, nontender, BS active, no palp mass Rectal:  Deferred  Msk:  Symmetrical without gross deformities. . Neurologic:  Alert and  oriented x4;  grossly normal neurologically. Skin:  Intact without significant lesions or rashes.   Intake/Output from previous day: 07/20 0701 - 07/21 0700 In: 921 [I.V.:600; IV Piggyback:321] Out: 1650 [Urine:100; Emesis/NG output:1550] Intake/Output this shift: No intake/output data recorded.  Lab Results: Recent Labs    06/13/19 1321 06/13/19 1921 06/14/19 0409  WBC 7.5 7.9 7.4  HGB 13.1 13.2 11.0*  HCT 39.7 41.5 33.9*  PLT 352 338 253   BMET Recent Labs    06/12/19 0417 06/13/19 1321 06/13/19 1921 06/14/19 0409  NA 141 142  --  142  K 3.4* 3.2*  --  3.5  CL 100 98  --  100  CO2 29 32  --  30  GLUCOSE 124* 121*  --  96  BUN 13 17  --  20  CREATININE 0.63 0.72 0.72 0.66  CALCIUM 9.5 9.9  --  8.4*   LFT Recent Labs    06/14/19 0409  PROT 5.4*  ALBUMIN 3.0*  AST 22  ALT 22  ALKPHOS 99  BILITOT 0.5    . CBC Latest Ref Rng & Units 06/14/2019 06/13/2019 06/13/2019  WBC 4.0 - 10.5 K/uL 7.4 7.9 7.5  Hemoglobin 12.0 - 15.0 g/dL 11.0(L) 13.2 13.1  Hematocrit 36.0 - 46.0 % 33.9(L) 41.5 39.7  Platelets 150 - 400 K/uL 253 338 352    . CMP Latest Ref Rng & Units 06/14/2019 06/13/2019 06/13/2019  Glucose 70 - 99 mg/dL 96 121(H) -  BUN 8 - 23 mg/dL 20 17 -  Creatinine 0.44 - 1.00 mg/dL 0.66 0.72 0.72  Sodium 135 - 145 mmol/L 142 142 -  Potassium 3.5 - 5.1 mmol/L 3.5 3.2(L) -  Chloride 98 - 111 mmol/L 100 98 -   CO2 22 - 32 mmol/L 30 32 -  Calcium 8.9 - 10.3 mg/dL 8.4(L) 9.9 -  Total Protein 6.5 - 8.1 g/dL 5.4(L) 6.7 -  Total Bilirubin 0.3 - 1.2 mg/dL 0.5 0.4 -  Alkaline Phos 38 - 126 U/L 99 142(H) -  AST 15 - 41 U/L 22 25 -  ALT 0 - 44 U/L 22 26 -   Studies/Results: Dg Abd 1 View  Result Date: 06/13/2019 CLINICAL DATA:  NG tube placement. EXAM: ABDOMEN - 1 VIEW COMPARISON:  Radiograph yesterday. CT 05/20/2019 FINDINGS: Tip of the enteric tube below the diaphragm in the stomach, side-port in the region of the gastroesophageal junction. Mild gaseous gastric distension. No other bowel dilatation. Biliary stent in the right upper quadrant with pneumobilia. No free air. IMPRESSION: Tip of the enteric tube below the diaphragm in the stomach, side-port in the region of the gastroesophageal junction. Advancement of 3-5 cm would lead to optimal placement. Electronically Signed   By: Keith Rake M.D.   On: 06/13/2019 19:59   Dg Abd 2 Views  Result Date: 06/13/2019 CLINICAL DATA:  Pancreatic cancer. Partial gastric outlet obstruction on EGD. Nausea and vomiting. EXAM: ABDOMEN - 2 VIEW COMPARISON:  May 20, 2019 FINDINGS: The common bile duct stent is stable. Pneumobilia is seen as expected. No other acute abnormalities. IMPRESSION: Stable common bile duct stent and pneumobilia. No other acute abnormalities. Electronically Signed   By: Dorise Bullion III M.D   On: 06/13/2019 15:07     Tye Savoy, NP-C @  06/14/2019, 9:00 AM    Attending physician's note   I have taken a history, examined the patient and reviewed the chart. I agree with the Advanced Practitioner's note, impression and recommendations.  51 yr F with recent diagnosis of pancreatic head Ca, s/p ERCP with CBD stent 03/29/19.  She didn't tolerate neoadjuvant chemo Persistent nausea and vomiting, failing to thrive  No significant output through NG tube  No evidence of gastric outlet obstruction on upper GI series  Slowly advance diet  as tolerated, will try clears today  Small frequent meals  Anti emetics and supportive care  Ok to DC NG tube  Damaris Hippo , MD [phone]

## 2019-06-14 NOTE — Plan of Care (Signed)
  Problem: Acute Rehab PT Goals(only PT should resolve) Goal: Patient Will Transfer Sit To/From Stand Outcome: Progressing Flowsheets (Taken 06/14/2019 1312) Patient will transfer sit to/from stand: Independently Goal: Pt Will Transfer Bed To Chair/Chair To Bed Outcome: Progressing Flowsheets (Taken 06/14/2019 1312) Pt will Transfer Bed to Chair/Chair to Bed: Independently Goal: Pt Will Perform Standing Balance Or Pre-Gait Outcome: Progressing Flowsheets (Taken 06/14/2019 1312) Pt will perform standing balance or pre-gait: with Supervision Note: SLS for 20 sec, tandem for 20 sec Goal: Pt Will Ambulate Outcome: Progressing Flowsheets (Taken 06/14/2019 1312) Pt will Ambulate:  > 125 feet  with supervision

## 2019-06-14 NOTE — Progress Notes (Addendum)
Initial Nutrition Assessment  RD working remotely.   DOCUMENTATION CODES:   (unable to assess for malnutrition at this time.)  INTERVENTION:  - diet advancement as medically feasible.    NUTRITION DIAGNOSIS:   Inadequate oral intake related to inability to eat, acute illness, nausea, vomiting as evidenced by NPO status, per patient/family report.  GOAL:   Patient will meet greater than or equal to 90% of their needs  MONITOR:   Diet advancement, Labs, Weight trends, I & O's  REASON FOR ASSESSMENT:   Malnutrition Screening Tool  ASSESSMENT:   73 y.o. female with medical history significant for anxiety and depression, history of breast cancer s/p lumpectomy and radiation in 2000, history of dysrhythmia, HTN, hypothyroidism, recently diagnosed pancreatic cancer who underwent an EUS on 7/16 for fiducial markings of the pancreas head mass.  It is noted that she had partial GOO.  Since her procedure she has had significant N/V and inability to keep anything down. She was seen in the ED on 7/19 and sent home with IV fluids and Compazine with no improvement and presented to her oncologist on 7/19 with similar complaints. Denied any lightheadedness or dizziness but does feel weak. Today (7/20) she was seen by her gastroenterologist and referred as a direct admission to [facility]. [facility] was asked to admit for intractable N/V likely in the setting of GOO which is partial; GI consulted and recommended NGT placement.  Patient has been NPO since admission. NGT placed in the ED yesterday evening. Per LDA avatar, 450 ml output during night shift. Patient is being followed by [facility] and was last seen remotely on 7/7. At that time patient had reported appetite was slightly improved since stopping chemo, but that appetite still was not back to baseline. She was drinking Medical illustrator once/day most days. She had reported ongoing loose stools and ongoing (although slightly  improved) nausea.   Patient reports that from 7/7 until marking EGD on 7/16 nutrition-related items were the same as outlined in the paragraph above. But, since procedure she has felt very nauseated most of the time and increasingly weak. For the past 3-4 days she had had multiple episodes of vomiting/day with inability to keep any PO items down, including liquids.   Per chart review, current weight is 115 lb, weight on 6/29 was 120 lb indicating 5 lb weight loss (4.2% body weight) in the past 3 weeks, and weight on 6/12 was 123 lb which indicates 8 lb weight loss (6.5% body weight) in the past 5 weeks. Suspect some degree of malnutrition but unable to confirm at this time without performing NFPE.   GI is following and recommends a UGI series and possible need for duodenal stent in the future.    Labs reviewed; Ca: 8.4 mg/dl. Medications reviewed; 25 mcg IV synthroid/day, 10 mEq IV KCl x4 runs 7/21. IVF; NS @ 100 ml/hr.      NUTRITION - FOCUSED PHYSICAL EXAM:  unable to complete at this time.  Diet Order:   Diet Order            Diet NPO time specified  Diet effective now              EDUCATION NEEDS:   No education needs have been identified at this time  Skin:  Skin Assessment: Reviewed RN Assessment  Last BM:  7/18  Height:   Ht Readings from Last 1 Encounters:  06/13/19 5\' 4"  (1.626 m)    Weight:  Wt Readings from Last 1 Encounters:  06/14/19 52.1 kg    Ideal Body Weight:  54.5 kg  BMI:  Body mass index is 19.72 kg/m.  Estimated Nutritional Needs:   Kcal:  1930-2190 kcal  Protein:  105-120 grams  Fluid:  >/= 2.2 L/day      Jarome Matin, MS, RD, LDN, [facility] Inpatient Clinical Dietitian Pager # [phone] After hours/weekend pager # [phone]

## 2019-06-14 NOTE — Progress Notes (Signed)
PROGRESS NOTE    Julie Rollins  QMG:[id number] DOB: 29-Apr-1946 DOA: 06/13/2019 PCP: Nolene Ebbs, MD   Brief Narrative:  Julie Rollins is a 73 y.o. female with medical history significant for anxiety and depression, history of breast cancer status post lumpectomy and radiation 2000, history of dysrhythmia, hypertension, history of hypothyroidism, recently diagnosed pancreatic cancer who recently underwent an EUS on 06/09/2019 for fiducial markings of the pancreas head mass.  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  Since her procedure she states that she has had significant nausea and vomiting and has inability to keep anything down.  Recently was seen in the ED yesterday and sent home with IV fluids and Compazine with no improvement and presented to her oncologist with similar complaints.  States that she has not had a proper meal and at least 2 days.  Denies any lightheadedness or dizziness but does feel weak.  No chest pain, lightheadedness or dizziness.  Has not had a bowel movement today she states from not eating properly.  Today she was seen by her Gastroenterologist and they referred her as a direct admission to [facility].  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  **Interim History Patient's nausea and vomiting is improved with IV fluid hydration and NG tube as well as antiemetics.  GI consulted and recommending upper GI series and will decide on duodenal stent later on pending clinical course.  Patient to start SBRT with simulation on 06/16/2019  Assessment & Plan:   Active Problems:   Generalized weakness   Abnormal finding on GI tract imaging   Pancreatic mass   Hypothyroidism   Malignant neoplasm of head of pancreas (HCC)   Personal history of breast cancer   Intractable  nausea and vomiting  Intractable Nausea and Vomiting of GOO from Extrinsic compression from Pancreatic Cancer, improving -Admit to Inpatient Telemetry -NPO for now -Place NG Tube and have LIWS -Give 500 mL Bolus of NS and Start Maintenance IVF with NS at rate of 100 mL/hr -Recently had an EUS with placement of fiducial markers in the pancreatic head mass and there is partial gastric outlet obstruction noted related to pancreatic mass -Consult GI for further evaluation and possible placement of stent -We will notify Dr. [name] of patient's admission -SARS-CoV-2 testing is pending however do not feel like she has a recently tested negative -Further evaluation by Gastroenterology and appreciate management -Continue antiemetics with IV and p.o. Zofran and will add IV Phenergan as needed if necessary will need to be careful given her prolonged QTC -Checked Lipase Level and was 81 and trended down from prior at 128 -PT/OT recommending Home Health -GI consulted and they are recommending obtaining an upper GI series and depending on clinical course she may need a duodenal stent at some point  Tachycardia in the setting of Dehydration/Hx of Dysrythmia -Resume Home Cardizem 180 mg po daily once Verified by Pharmacy but since NPO will change to IV Metoprolol 2.5 mg q12h -C/w IVF Hydration with NS at a rate 100 mL/hr and reduce rate to 75 mL/hr x1 Day -Check EKG and showed Sinus Tachycardia at a rate of 108 and QTc of 525 and a LBBB -Continue to Monitor on Telemetry   Hypokalemia -Patient's K+ was 3.2 and improved to 3.5 -Checked Mag Level and was 2.1; Replete  with IV KCl 40 mEQ again  -Continue to Monitor and Replete as Necessary -Repeat CMP in AM   Hyperglycemia -Check HbA1c -Continue to Monitor Blood Sugars Carefully -If Necessary Will place on Sensitive Novolog SSI  -CBG this AM was 76 and Blood Glucose was 96 on CMP  Anxiety -C/w Mirtazapine 7.5 mg po qHS  Hypothyroidism -Check TSH  om AM -C/w Levothyroxine 50 mcg but will change to IV Dose  Pancreatic cancer recently diagnosed in May 2020 status post 2 cycles of FOLFIRINOX -Has Hx of Breast Cancer in 2000 with Lumpectomy and Radiation  -With poor response and discontinued after cycle 2 -Has been referred for SBRT simulation planning on 06/16/2019 and may be able to be done while she is hospitalized here now -Will notify Dr. [name] and will also need to discuss with Radiation Onc  Prolonged QTC -Continue monitor on telemetry -Continue to replete electrolytes and repeat -Continue with IV fluid hydration -Patient's QTC was 525 this morning and will need to be closely monitored as she is on antiemetics with IV Zofran  Normocytic Anemia -Hemoglobin/hematocrit went from 13.2/41.5 is now 11.0/33.9 -Likely dilutional drop in the setting of IV fluid resuscitation as patient may have been hemoconcentrated from dehydration -Check anemia panel in the a.m. -Continue monitor for signs and symptoms of bleeding; currently no overt bleeding noted -Repeat CBC in a.m.  DVT prophylaxis: Heparin 5,000 units sq q8h Code Status: FULL CODE Family Communication: No family present at bedside  Disposition Plan: Remain inpatient for continued evaluation and treatment  Consultants:   Gastroenterology  Medical Oncology  Will notify Radiation Oncology that patient is hospitalized   Procedures: Upper GI series  Antimicrobials:  Anti-infectives (From admission, onward)   None     Subjective: Seen and examined at bedside states that she was tired and did not sleep very well he has several nighttime disturbances from staff but is feeling a little bit better in terms of nausea and vomiting.  Has NG tube with improvement in symptoms.  States abdomen is not as well.  No other concerns or complaints at this time and wanting to sleep  Objective: Vitals:   06/13/19 1716 06/13/19 1933 06/13/19 1950 06/14/19 0441  BP: (!) 156/73  124/66  123/65  Pulse: (!) 116  (!) 109 (!) 101  Resp: (!) 24  16 16   Temp: 98.5 F (36.9 C)  (!) 97.4 F (36.3 C) 97.6 F (36.4 C)  TempSrc: Oral  Oral Oral  SpO2: 98%  93% 96%  Weight:  53.7 kg  52.1 kg  Height:  5\' 4"  (1.626 m)      Intake/Output Summary (Last 24 hours) at 06/14/2019 1330 Last data filed at 06/14/2019 0500 Gross per 24 hour  Intake 921.02 ml  Output 1650 ml  Net -728.98 ml   Filed Weights   06/13/19 1933 06/14/19 0441  Weight: 53.7 kg 52.1 kg   Examination: Physical Exam:  Constitutional: Thin chronically ill-appearing Caucasian female, NAD and appears calm and comfortable Eyes: Lids and conjunctivae normal, sclerae anicteric  ENMT: External Ears, Nose appear normal. Grossly normal hearing. Has NGT in place Neck: Appears normal, supple, no cervical masses, normal ROM, no appreciable thyromegaly; No JVD Respiratory: Diminished to auscultation bilaterally, no wheezing, rales, rhonchi or crackles. Normal respiratory effort and patient is not tachypenic. No accessory muscle use.  Cardiovascular: Mildly tachycardic rate but regular rhythm, no murmurs / rubs / gallops. S1 and S2 auscultated. No extremity edema. Abdomen: Soft, non-tender, non-distended. No masses palpated.  No appreciable hepatosplenomegaly. Bowel sounds positive x4.  GU: Deferred. Musculoskeletal: No clubbing / cyanosis of digits/nails. No joint deformity upper and lower extremities. Has a Port-A-Cath in place on the Rightside  Skin: No rashes, lesions, ulcers on a limited skin evaluation. No induration; Warm and dry.  Neurologic: CN 2-12 grossly intact with no focal deficits. Romberg sign and cerebellar reflexes not assessed.  Psychiatric: Normal judgment and insight. Alert and oriented x 3. Mildly anxious mood and appropriate affect.   Data Reviewed: I have personally reviewed following labs and imaging studies  CBC: Recent Labs  Lab 06/12/19 0417 06/13/19 1321 06/13/19 1921 06/14/19 0409  WBC  7.4 7.5 7.9 7.4  NEUTROABS 5.6 5.4  --   --   HGB 13.3 13.1 13.2 11.0*  HCT 41.5 39.7 41.5 33.9*  MCV 97.4 95.4 97.6 98.5  PLT 311 352 338 916   Basic Metabolic Panel: Recent Labs  Lab 06/12/19 0417 06/13/19 1321 06/13/19 1921 06/14/19 0409  NA 141 142  --  142  K 3.4* 3.2*  --  3.5  CL 100 98  --  100  CO2 29 32  --  30  GLUCOSE 124* 121*  --  96  BUN 13 17  --  20  CREATININE 0.63 0.72 0.72 0.66  CALCIUM 9.5 9.9  --  8.4*  MG  --   --  2.0 2.1  PHOS  --   --  3.4 4.0   GFR: Estimated Creatinine Clearance: 50.7 mL/min (by C-G formula based on SCr of 0.66 mg/dL). Liver Function Tests: Recent Labs  Lab 06/12/19 0417 06/13/19 1321 06/14/19 0409  AST 25 25 22   ALT 28 26 22   ALKPHOS 136* 142* 99  BILITOT 0.7 0.4 0.5  PROT 6.9 6.7 5.4*  ALBUMIN 3.6 3.4* 3.0*   Recent Labs  Lab 06/12/19 0417 06/13/19 1921  LIPASE 128* 81*   No results for input(s): AMMONIA in the last 168 hours. Coagulation Profile: No results for input(s): INR, PROTIME in the last 168 hours. Cardiac Enzymes: No results for input(s): CKTOTAL, CKMB, CKMBINDEX, TROPONINI in the last 168 hours. BNP (last 3 results) No results for input(s): PROBNP in the last 8760 hours. HbA1C: No results for input(s): HGBA1C in the last 72 hours. CBG: Recent Labs  Lab 06/14/19 0816  GLUCAP 76   Lipid Profile: No results for input(s): CHOL, HDL, LDLCALC, TRIG, CHOLHDL, LDLDIRECT in the last 72 hours. Thyroid Function Tests: No results for input(s): TSH, T4TOTAL, FREET4, T3FREE, THYROIDAB in the last 72 hours. Anemia Panel: No results for input(s): VITAMINB12, FOLATE, FERRITIN, TIBC, IRON, RETICCTPCT in the last 72 hours. Sepsis Labs: No results for input(s): PROCALCITON, LATICACIDVEN in the last 168 hours.  Recent Results (from the past 240 hour(s))  SARS Coronavirus 2 (Performed in [place] hospital lab)     Status: None   Collection Time: 06/07/19 10:04 AM   Specimen: Nasal Swab  Result Value Ref  Range Status   SARS Coronavirus 2 NEGATIVE NEGATIVE Final    Comment: (NOTE) SARS-CoV-2 target nucleic acids are NOT DETECTED. The SARS-CoV-2 RNA is generally detectable in upper and lower respiratory specimens during the acute phase of infection. Negative results do not preclude SARS-CoV-2 infection, do not rule out co-infections with other pathogens, and should not be used as the sole basis for treatment or other patient management decisions. Negative results must be combined with clinical observations, patient history, and epidemiological information. The expected result is Negative. Fact Sheet for Patients: SugarRoll.be Fact  Sheet for Healthcare Providers: https://www.woods-mathews.com/ This test is not yet approved or cleared by the [place] FDA and  has been authorized for detection and/or diagnosis of SARS-CoV-2 by FDA under an Emergency Use Authorization (EUA). This EUA will remain  in effect (meaning this test can be used) for the duration of the COVID-19 declaration under Section 56 4(b)(1) of the Act, 21 U.S.C. section 360bbb-3(b)(1), unless the authorization is terminated or revoked sooner. Performed at [facility], [place] [address] [place], [place] [address]   SARS Coronavirus 2 (CEPHEID - Performed in [place] hospital lab), Hosp Order     Status: None   Collection Time: 06/13/19  5:38 PM   Specimen: Nasopharyngeal Swab  Result Value Ref Range Status   SARS Coronavirus 2 NEGATIVE NEGATIVE Final    Comment: (NOTE) If result is NEGATIVE SARS-CoV-2 target nucleic acids are NOT DETECTED. The SARS-CoV-2 RNA is generally detectable in upper and lower  respiratory specimens during the acute phase of infection. The lowest  concentration of SARS-CoV-2 viral copies this assay can detect is 250  copies / mL. A negative result does not preclude SARS-CoV-2 infection  and should not be used as the sole basis for treatment or  other  patient management decisions.  A negative result may occur with  improper specimen collection / handling, submission of specimen other  than nasopharyngeal swab, presence of viral mutation(s) within the  areas targeted by this assay, and inadequate number of viral copies  (<250 copies / mL). A negative result must be combined with clinical  observations, patient history, and epidemiological information. If result is POSITIVE SARS-CoV-2 target nucleic acids are DETECTED. The SARS-CoV-2 RNA is generally detectable in upper and lower  respiratory specimens dur ing the acute phase of infection.  Positive  results are indicative of active infection with SARS-CoV-2.  Clinical  correlation with patient history and other diagnostic information is  necessary to determine patient infection status.  Positive results do  not rule out bacterial infection or co-infection with other viruses. If result is PRESUMPTIVE POSTIVE SARS-CoV-2 nucleic acids MAY BE PRESENT.   A presumptive positive result was obtained on the submitted specimen  and confirmed on repeat testing.  While 2019 novel coronavirus  (SARS-CoV-2) nucleic acids may be present in the submitted sample  additional confirmatory testing may be necessary for epidemiological  and / or clinical management purposes  to differentiate between  SARS-CoV-2 and other Sarbecovirus currently known to infect humans.  If clinically indicated additional testing with an alternate test  methodology [id number]) is advised. The SARS-CoV-2 RNA is generally  detectable in upper and lower respiratory sp ecimens during the acute  phase of infection. The expected result is Negative. Fact Sheet for Patients:  StrictlyIdeas.no Fact Sheet for Healthcare Providers: BankingDealers.co.za This test is not yet approved or cleared by the [place] FDA and has been authorized for detection and/or diagnosis of  SARS-CoV-2 by FDA under an Emergency Use Authorization (EUA).  This EUA will remain in effect (meaning this test can be used) for the duration of the COVID-19 declaration under Section 564(b)(1) of the Act, 21 U.S.C. section 360bbb-3(b)(1), unless the authorization is terminated or revoked sooner. Performed at [facility], [place] [address] [place], [place] [address]     Radiology Studies: Dg Abd 1 View  Result Date: 06/13/2019 CLINICAL DATA:  NG tube placement. EXAM: ABDOMEN - 1 VIEW COMPARISON:  Radiograph yesterday. CT 05/20/2019 FINDINGS: Tip of the enteric tube below  the diaphragm in the stomach, side-port in the region of the gastroesophageal junction. Mild gaseous gastric distension. No other bowel dilatation. Biliary stent in the right upper quadrant with pneumobilia. No free air. IMPRESSION: Tip of the enteric tube below the diaphragm in the stomach, side-port in the region of the gastroesophageal junction. Advancement of 3-5 cm would lead to optimal placement. Electronically Signed   By: Keith Rake M.D.   On: 06/13/2019 19:59   Dg Abd 2 Views  Result Date: 06/13/2019 CLINICAL DATA:  Pancreatic cancer. Partial gastric outlet obstruction on EGD. Nausea and vomiting. EXAM: ABDOMEN - 2 VIEW COMPARISON:  May 20, 2019 FINDINGS: The common bile duct stent is stable. Pneumobilia is seen as expected. No other acute abnormalities. IMPRESSION: Stable common bile duct stent and pneumobilia. No other acute abnormalities. Electronically Signed   By: Dorise Bullion III M.D   On: 06/13/2019 15:07   Scheduled Meds: . chlorhexidine  15 mL Mouth Rinse BID  . diltiazem  180 mg Oral Daily  . heparin  5,000 Units Subcutaneous Q8H  . levothyroxine  25 mcg Intravenous Daily  . mouth rinse  15 mL Mouth Rinse q12n4p  . metoprolol tartrate  2.5 mg Intravenous Q12H  . mirtazapine  7.5 mg Oral QHS   Continuous Infusions: . sodium chloride 100 mL/hr at 06/14/19 0851    LOS: 1 day    Kerney Elbe, DO [facility] PAGER is on [place]  If 7PM-7AM, please contact night-coverage [email or web address] Password [email or web address] 06/14/2019, 1:30 PM

## 2019-06-14 NOTE — Evaluation (Signed)
Physical Therapy Evaluation Patient Details Name: Julie Rollins MRN: [id number] DOB: 07/05/1946 Today's Date: 06/14/2019   History of Present Illness  73 y.o. female with PMH hypothyroidism,  of breath cancer s/p lumpectomy and radiation in 2000, HTN, and cancer of pancreatic head diagnosed in early May after presenting to hospital with jaundice s/p ERCP with CBD stenting 03/29/19.     Clinical Impression  PAULEEN GOLEMAN is a 73 y.o. female admitted for above diagnosis. Patient lives alone and is independent at baseline. She has been limited by fatigue and nausea however reported improvement in symptoms at therapist arrival. She was able to transfer supine<> sit at Mod I level and required supervision to min guard for gait and mobility. Patient has mild balance impairments observed with backwards ambulation and SLS challenges and performed 5x sit to stand test and required UE use to complete indicating functional bil LE weakness as well. She will benefit from skilled PT interventions to address impairments and improve independence with mobility to return to PLOF.    Follow Up Recommendations Home health PT    Equipment Recommendations  None recommended by PT    Recommendations for Other Services       Precautions / Restrictions Precautions Precautions: Fall Restrictions Weight Bearing Restrictions: No      Mobility  Bed Mobility Overal bed mobility: Modified Independent             General bed mobility comments: pt using bed rail and therapist hand to pull up to sit with HOB elevated  Transfers Overall transfer level: Needs assistance Equipment used: None Transfers: Sit to/from Stand Sit to Stand: Supervision         General transfer comment: pt performed sit<>stand from EOB with use of UE to initiate. 5x Sit<>Stand performed for stength test form bedside recliner (pt performed in 21.9 seconds with UE ues on thighs).  Ambulation/Gait Ambulation/Gait assistance:  Supervision;Min guard Gait Distance (Feet): 40 Feet(within hospital room) Assistive device: None Gait Pattern/deviations: WFL(Within Functional Limits);Decreased stride length;Narrow base of support Gait velocity: slow   General Gait Details: pt with normalized gait pattern but slightly decreased stride length      Balance Overall balance assessment: Needs assistance Sitting-balance support: No upper extremity supported;Feet supported Sitting balance-Leahy Scale: Good     Standing balance support: No upper extremity supported;During functional activity Standing balance-Leahy Scale: Good Standing balance comment: pt with good static standing and good dynamic balance during backwards gait although impairments are evident Single Leg Stance - Right Leg: 11 Single Leg Stance - Left Leg: 10            Pertinent Vitals/Pain Pain Assessment: Faces Faces Pain Scale: Hurts a little bit Pain Location: pt reporting discomfort behind her eyes stating she feels it is from putting the NG tube in place Pain Descriptors / Indicators: Guarding Pain Intervention(s): Monitored during session;Limited activity within patient's tolerance    Home Living Family/patient expects to be discharged to:: Private residence Living Arrangements: Alone Available Help at Discharge: Friend(s);Available PRN/intermittently Type of Home: Apartment Home Access: Level entry     Home Layout: One level Home Equipment: Grab bars - tub/shower;Walker - 4 wheels(rollator)      Prior Function Level of Independence: Independent         Comments: Pt lives alone and is independent at baseline. She is currently limited by fatigue with mobilty and ADL's. Pt is independent with gait and transfers wtih no device and uses rollator if fatigued, she denies falls  in last 6 months     Hand Dominance        Extremity/Trunk Assessment   Upper Extremity Assessment Upper Extremity Assessment: Defer to OT evaluation     Lower Extremity Assessment Lower Extremity Assessment: Overall WFL for tasks assessed    Cervical / Trunk Assessment Cervical / Trunk Assessment: Normal  Communication   Communication: No difficulties  Cognition Arousal/Alertness: Awake/alert Behavior During Therapy: WFL for tasks assessed/performed Overall Cognitive Status: Within Functional Limits for tasks assessed                    Assessment/Plan    PT Assessment Patient needs continued PT services  PT Problem List Decreased balance;Decreased strength;Decreased activity tolerance       PT Treatment Interventions Gait training;Functional mobility training;Therapeutic activities;Balance training;Patient/family education;Therapeutic exercise    PT Goals (Current goals can be found in the Care Plan section)  Acute Rehab PT Goals Patient Stated Goal: pt discussed goal to return home, become more independent, and have energy and strength to dance again PT Goal Formulation: With patient Time For Goal Achievement: 06/21/19 Potential to Achieve Goals: Good    Frequency Min 3X/week        Co-evaluation PT/OT/SLP Co-Evaluation/Treatment: Yes Reason for Co-Treatment: To address functional/ADL transfers;Complexity of the patient's impairments (multi-system involvement) PT goals addressed during session: Mobility/safety with mobility;Balance OT goals addressed during session: ADL's and self-care;Strengthening/ROM;Proper use of Adaptive equipment and DME       AM-PAC PT "6 Clicks" Mobility  Outcome Measure Help needed turning from your back to your side while in a flat bed without using bedrails?: A Little Help needed moving from lying on your back to sitting on the side of a flat bed without using bedrails?: A Little Help needed moving to and from a bed to a chair (including a wheelchair)?: A Little Help needed standing up from a chair using your arms (e.g., wheelchair or bedside chair)?: A Little Help needed to  walk in hospital room?: A Little Help needed climbing 3-5 steps with a railing? : A Little 6 Click Score: 18    End of Session Equipment Utilized During Treatment: Gait belt Activity Tolerance: Patient tolerated treatment well Patient left: in chair;with call bell/phone within reach;with chair alarm set Nurse Communication: Mobility status PT Visit Diagnosis: Unsteadiness on feet (R26.81);Other abnormalities of gait and mobility (R26.89)    Time: 1120-1140 PT Time Calculation (min) (ACUTE ONLY): 20 min   Charges:   PT Evaluation $PT Eval Low Complexity: 1 Low          Kipp Brood, PT, DPT, [facility] Physical Therapist with [facility]  06/14/2019 1:04 PM

## 2019-06-15 DIAGNOSIS — E43 Unspecified severe protein-calorie malnutrition: Secondary | ICD-10-CM

## 2019-06-15 DIAGNOSIS — Z4659 Encounter for fitting and adjustment of other gastrointestinal appliance and device: Secondary | ICD-10-CM

## 2019-06-15 LAB — CBC WITH DIFFERENTIAL/PLATELET
Abs Immature Granulocytes: 0.01 10*3/uL (ref 0.00–0.07)
Basophils Absolute: 0.1 10*3/uL (ref 0.0–0.1)
Basophils Relative: 1 %
Eosinophils Absolute: 0.3 10*3/uL (ref 0.0–0.5)
Eosinophils Relative: 5 %
HCT: 31.4 % — ABNORMAL LOW (ref 36.0–46.0)
Hemoglobin: 9.8 g/dL — ABNORMAL LOW (ref 12.0–15.0)
Immature Granulocytes: 0 %
Lymphocytes Relative: 22 %
Lymphs Abs: 1.2 10*3/uL (ref 0.7–4.0)
MCH: 31.9 pg (ref 26.0–34.0)
MCHC: 31.2 g/dL (ref 30.0–36.0)
MCV: 102.3 fL — ABNORMAL HIGH (ref 80.0–100.0)
Monocytes Absolute: 0.6 10*3/uL (ref 0.1–1.0)
Monocytes Relative: 11 %
Neutro Abs: 3.4 10*3/uL (ref 1.7–7.7)
Neutrophils Relative %: 61 %
Platelets: 215 10*3/uL (ref 150–400)
RBC: 3.07 MIL/uL — ABNORMAL LOW (ref 3.87–5.11)
RDW: 13.6 % (ref 11.5–15.5)
WBC: 5.6 10*3/uL (ref 4.0–10.5)
nRBC: 0 % (ref 0.0–0.2)

## 2019-06-15 LAB — COMPREHENSIVE METABOLIC PANEL
ALT: 22 U/L (ref 0–44)
AST: 22 U/L (ref 15–41)
Albumin: 2.7 g/dL — ABNORMAL LOW (ref 3.5–5.0)
Alkaline Phosphatase: 91 U/L (ref 38–126)
Anion gap: 10 (ref 5–15)
BUN: 14 mg/dL (ref 8–23)
CO2: 24 mmol/L (ref 22–32)
Calcium: 8.3 mg/dL — ABNORMAL LOW (ref 8.9–10.3)
Chloride: 109 mmol/L (ref 98–111)
Creatinine, Ser: 0.51 mg/dL (ref 0.44–1.00)
GFR calc Af Amer: >60 mL/min (ref >60)
GFR calc non Af Amer: >60 mL/min (ref >60)
Glucose, Bld: 67 mg/dL — ABNORMAL LOW (ref 70–99)
Potassium: 3.4 mmol/L — ABNORMAL LOW (ref 3.5–5.1)
Sodium: 143 mmol/L (ref 135–145)
Total Bilirubin: 0.8 mg/dL (ref 0.3–1.2)
Total Protein: 5.2 g/dL — ABNORMAL LOW (ref 6.5–8.1)

## 2019-06-15 LAB — RETICULOCYTES
Immature Retic Fract: 11.9 % (ref 2.3–15.9)
RBC.: 3.07 MIL/uL — ABNORMAL LOW (ref 3.87–5.11)
Retic Count, Absolute: 20.9 10*3/uL (ref 19.0–186.0)
Retic Ct Pct: 0.7 % (ref 0.4–3.1)

## 2019-06-15 LAB — GLUCOSE, CAPILLARY
Glucose-Capillary: 79 mg/dL (ref 70–99)
Glucose-Capillary: 65 mg/dL — ABNORMAL LOW (ref 70–99)
Glucose-Capillary: 130 mg/dL — ABNORMAL HIGH (ref 70–99)
Glucose-Capillary: 75 mg/dL (ref 70–99)
Glucose-Capillary: 61 mg/dL — ABNORMAL LOW (ref 70–99)
Glucose-Capillary: 172 mg/dL — ABNORMAL HIGH (ref 70–99)
Glucose-Capillary: 138 mg/dL — ABNORMAL HIGH (ref 70–99)
Glucose-Capillary: 121 mg/dL — ABNORMAL HIGH (ref 70–99)
Glucose-Capillary: 141 mg/dL — ABNORMAL HIGH (ref 70–99)

## 2019-06-15 LAB — URINE CULTURE: Culture: NO GROWTH

## 2019-06-15 LAB — FOLATE: Folate: 25.6 ng/mL (ref >5.9)

## 2019-06-15 LAB — VITAMIN B12: Vitamin B-12: 766 pg/mL (ref 180–914)

## 2019-06-15 LAB — PROTIME-INR
INR: 1.1 (ref 0.8–1.2)
Prothrombin Time: 14.3 seconds (ref 11.4–15.2)

## 2019-06-15 LAB — IRON AND TIBC
Iron: 44 ug/dL (ref 28–170)
Saturation Ratios: 22 % (ref 10.4–31.8)
TIBC: 205 ug/dL — ABNORMAL LOW (ref 250–450)
UIBC: 161 ug/dL

## 2019-06-15 LAB — TSH: TSH: 1.191 u[IU]/mL (ref 0.350–4.500)

## 2019-06-15 LAB — PHOSPHORUS: Phosphorus: 3.1 mg/dL (ref 2.5–4.6)

## 2019-06-15 LAB — FERRITIN: Ferritin: 94 ng/mL (ref 11–307)

## 2019-06-15 LAB — MAGNESIUM: Magnesium: 2.2 mg/dL (ref 1.7–2.4)

## 2019-06-15 LAB — PREALBUMIN: Prealbumin: 7.5 mg/dL — ABNORMAL LOW (ref 18–38)

## 2019-06-15 MED ORDER — ONDANSETRON HCL 4 MG/2ML IJ SOLN
4.0000 mg | Freq: Three times a day (TID) | INTRAMUSCULAR | Status: DC
  Administered 2019-06-15 – 2019-06-17 (×7): 4 mg via INTRAVENOUS
  Filled 2019-06-15 (×7): qty 2

## 2019-06-15 MED ORDER — PANTOPRAZOLE SODIUM 40 MG IV SOLR
40.0000 mg | Freq: Two times a day (BID) | INTRAVENOUS | Status: DC
  Administered 2019-06-15 – 2019-06-23 (×17): 40 mg via INTRAVENOUS
  Filled 2019-06-15 (×17): qty 40

## 2019-06-15 MED ORDER — HYDROCORTISONE (PERIANAL) 2.5 % EX CREA
TOPICAL_CREAM | Freq: Two times a day (BID) | CUTANEOUS | Status: DC
  Administered 2019-06-15 – 2019-06-16 (×4): via RECTAL
  Filled 2019-06-15: qty 28.35

## 2019-06-15 MED ORDER — DILTIAZEM HCL ER COATED BEADS 180 MG PO CP24
180.0000 mg | ORAL_CAPSULE | Freq: Every day | ORAL | Status: DC
  Administered 2019-06-15 – 2019-06-23 (×8): 180 mg via ORAL
  Filled 2019-06-15 (×9): qty 1

## 2019-06-15 MED ORDER — DEXTROSE-NACL 5-0.9 % IV SOLN
INTRAVENOUS | Status: DC
  Administered 2019-06-15 – 2019-06-17 (×6): via INTRAVENOUS

## 2019-06-15 MED ORDER — DEXTROSE 50 % IV SOLN
12.5000 g | INTRAVENOUS | Status: AC
  Administered 2019-06-15: 12.5 g via INTRAVENOUS
  Filled 2019-06-15: qty 50

## 2019-06-15 MED ORDER — SODIUM CHLORIDE 0.9 % IV SOLN
510.0000 mg | Freq: Once | INTRAVENOUS | Status: AC
  Administered 2019-06-15: 510 mg via INTRAVENOUS
  Filled 2019-06-15: qty 17

## 2019-06-15 NOTE — Progress Notes (Signed)
PROGRESS NOTE  Julie Rollins SEG:[id number] DOB: [birth year] DOA: 06/13/2019 PCP: Nolene Ebbs, MD  Brief History   Julie Rollins a 73 y.o.femalewith medical history significantforanxiety and depression, history of breast cancer status post lumpectomy and radiation 2000, history of dysrhythmia, hypertension, history of hypothyroidism, recently diagnosed pancreatic cancer who recently underwent an EUS on 06/09/2019 for fiducial markings of the pancreas head mass. 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 Since her procedure she states that she has had significant nausea and vomiting and has inability to keep anything down. Recently was seen in the ED yesterday and sent home with IV fluids and Compazine with no improvement and presented to her oncologist with similar complaints. States that she has not had a proper meal and at least 2 days. Denies any lightheadedness or dizziness but does feel weak. No chest pain, lightheadedness or dizziness. Has not had a bowel movement today she states from not eating properly. Today she was seen by her Gastroenterologist and they referred her as a direct admission to [facility]. 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  Medical oncology has been consulted, as has GI and general surgery. In a group text it was determined that the patient required nutritional support before she could undergo stenting of the duodenum. Dr. Barry Dienes was consulted and will place J-tube laparoscopically to allow for enteral nutrition. I also discussed the patient with radiation oncology. The patient will go to [facility] today to get set up for radiation therapy which will start on8/02/2019. This therapy will not be palliative and is not expected to reduce obstruction. I have  explained this to the patient and she has voiced understanding.  Consultants  . General surgery . Radiation Oncology . Medical Oncology . Gastroenterology  Procedures  . None  Antibiotics   Anti-infectives (From admission, onward)   None    .  Marland Kitchen   Subjective  The patient is lying in bed quietly. She continued to complain of abdominal pain. No new complaints.  Objective   Vitals:  Vitals:   06/15/19 0423 06/15/19 1247  BP: (!) 142/66 (!) 142/64  Pulse: 82 76  Resp: 16 18  Temp: 98.5 F (36.9 C) 98.2 F (36.8 C)  SpO2: 98% 97%    Exam:  Constitutional:  . The patient is awake, alert, and oriented x 3. No acute distress. Respiratory:  . CTA bilaterally, no w/r/r.  . Respiratory effort normal. No retractions or accessory muscle use Cardiovascular:  . RRR, no m/r/g . No LE extremity edema   . Normal pedal pulses Abdomen:  . Abdomen is somewhat distended and diffusely tender.  . No hernias, masses, or organomegaly is appreciated. Marland Kitchen Hypoactive bowel sounds.  Musculoskeletal:  . No cyanosis, clubbing, or edema Skin:  . No rashes, lesions, ulcers . palpation of skin: no induration or nodules Neurologic:  . CN 2-12 intact . Sensation all 4 extremities intact Psychiatric:  . Mental status o Mood, affect appropriate o Orientation to person, place, time  . judgment and insight appear intact   I have personally reviewed the following:   Today's Data  . CMP, prealbumin, CBC, Iron studies  Scheduled Meds: . chlorhexidine  15 mL Mouth Rinse BID  . diltiazem  180 mg Oral Daily  . hydrocortisone   Rectal BID  . levothyroxine  25 mcg Intravenous Daily  . mouth rinse  15 mL Mouth Rinse q12n4p  . metoprolol tartrate  2.5 mg Intravenous Q12H  . mirtazapine  7.5 mg Oral QHS  . ondansetron  4 mg Intravenous TID  . pantoprazole (PROTONIX) IV  40 mg Intravenous Q12H   Continuous Infusions: . dextrose 5 % and 0.9% NaCl 100 mL/hr at 06/15/19 1346    Active  Problems:   Generalized weakness   Abnormal finding on GI tract imaging   Pancreatic mass   Hypothyroidism   Malignant neoplasm of head of pancreas (HCC)   Personal history of breast cancer   Intractable nausea and vomiting   Partial gastric outlet obstruction   Encounter for nasogastric (NG) tube placement   LOS: 2 days   A & P  Partial gastric outlet obstruction: Thought to be due to pancreatic mass, although imaging does not especially support this. There is a plan to place a duodenal stent, but only after J-tube is placed. Radiation oncology states that their therapy which is planned to begin on 06/28/2019 will not be palliative and is not expected to resolve any obstruction. General surgery will take the patient for laparoscopy and placement of J-tube to be used for enteral nutrition.  Pancreatic Cancer: S/P Folfirinox x 2 cycles. Patient was unable to tolerate this therapy and it was stopped. She will start Radiation therapy (SBRT) on 06/28/2019, but will be set up for it during this inpatient stay.  Tachycardia: Rate appears controlled on oral cardizem and IV metoprolol. Blood pressures are within acceptable range. Continue IV fluids cautiously.  Hypokalemia: Supplement and monitor.  3.4 this morning.  Hyperglycemia: Actually hypoglycemic this am. The patient responded to D 50 x 2 and D5 1/2 NS at 100 cc /hr. Monitor.   Severe protein calorie malnutrition: Prealbumin of 7.5. Plan is for general surgery to perform laparoscopy with placement of J-tube to allow for enteral nutrition.  Anxiety: Continue mirtazapine 7.5 mg PO qhs.  Hypothyroidism: TSH is within normal limits. Continue levothyroxine at 50 mcg, but given IV.  Prolonged QTC: The patient is being monitored on telemetry. Monitor electrolytes. Avoid medications that will prolong QT.  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 Will supplement the patient's iron IV.  SubQ hematoma: At  this site of heparin injection. Will stop pharmacological DVT prophylaxis and start SCD's.  I have seen and examined this patient myself. I have spent 45 minutes in her evaluation and care. More than 50% of this was spent in coordination of care with radiation oncology, medical oncology, and general surgery.   DVT prophylaxis: SCD's CODE STATUS: Full Code Family Communication: No family present Disposition: tbd.  Ashkan Chamberland, DO [facility] Direct contact: see [email or web address]  7PM-7AM contact night coverage as above 06/15/2019, 7:24 PM  LOS: 2 days

## 2019-06-15 NOTE — Progress Notes (Signed)
Hypoglycemic Event  CBG: 65 Treatment: Dextrose 50% solution 12.5 g @ 2112  Symptoms: None  Follow-up CBG: Time: CBG Result:130  Possible Reasons for Event: NPO  Comments/MD notified:    Carmin Richmond

## 2019-06-15 NOTE — Consult Note (Addendum)
[facility] Surgery Consult Note  Julie Rollins 07-Oct-1946  [id number].    Requesting MD: Truitt Merle Chief Complaint/Reason for Consult: GOO  HPI:  Julie Rollins is a 73yo female PMH pancreatic cancer diagnosed in 03/2019, s/p biliary stent placement, s/p 2 cycles Folfirinox but this was stopped due to poor tolerance, who was admitted to [facility] 06/13/2019 with nausea and vomiting. Patient underwent EUS on 06/09/2019 for fiducial markings of the pancreas head mass.  It was noted at that time that she had partial gastric outlet obstruction at the distal duodenal bulb. She was admitted and NG tube placed for decompression. UGI series yesterday showed no evidence of gastric outlet or duodenal obstruction. NG tube was clamped this morning and she was started on clear liquids. She currently denies any nausea, but states that her abdomen "does not feel right." She reports some bloating. Last BM was yesterday. General surgery asked to see for consideration for placement of either J or GJ tube to help improve her nutritional status.   -PMH significant for Hx of Breast Cancer in 2000 with Lumpectomy and Radiation, Hypothyroidism, Anxiety -Abdominal surgical history: hysterectomy, hernia repair (patient thinks right inguinal) Anticoagulants: none -Nonsmoker -Lives at home alone  ROS: Review of Systems  Constitutional: Positive for malaise/fatigue. Negative for chills and fever.  HENT: Negative.   Eyes: Negative.   Respiratory: Negative.   Cardiovascular: Negative.   Gastrointestinal: Positive for abdominal pain, constipation, nausea and vomiting.  Genitourinary: Negative.   Musculoskeletal: Negative.   Skin: Negative.   Neurological: Negative.    All systems reviewed and otherwise negative except for as above  Family History  Problem Relation Age of Onset  . Bladder Cancer Brother   . Other Other        Denies family h/o cardiac disease  . Stomach cancer Maternal Uncle   . Throat cancer  Maternal Uncle     Past Medical History:  Diagnosis Date  . Anxiety   . Blood transfusion without reported diagnosis   . Breast CA ([place])    s/p lumpectomy and radiation 2000  . Breast cancer ([place])   . Colitis    2007  . Dyspnea   . Dysrhythmia   . Family history of bladder cancer   . Family history of stomach cancer   . Family history of throat cancer   . Hernia, epigastric   . HTN (hypertension)    "mild hypertension", was on BP med years ago but it caused orthostatic hypotension and she has not been medicated since  . Hypothyroid   . Pancreatic cancer ([place]) dx'd 03/2019  . Personal history of breast cancer   . Personal history of radiation therapy 2001  . Poor appetite 04/2019  . Vertigo   . Vertigo     Past Surgical History:  Procedure Laterality Date  . ABDOMINAL HYSTERECTOMY     71yrs ago  . ANTERIOR CERVICAL DECOMP/DISCECTOMY FUSION N/A 08/02/2018   Procedure: ANTERIOR CERVICAL DECOMPRESSION/DISCECTOMY FUSION CERVICAL FIVE- CERVICAL SIX;  Surgeon: Earnie Larsson, MD;  Location: [place];  Service: Neurosurgery;  Laterality: N/A;  ANTERIOR CERVICAL DECOMPRESSION/DISCECTOMY FUSION CERVICAL FIVE- CERVICAL SIX  . BILIARY BRUSHING  03/29/2019   Procedure: BILIARY BRUSHING;  Surgeon: [name], MD;  Location: [facility] ENDOSCOPY;  Service: Endoscopy;;  . BILIARY STENT PLACEMENT  03/29/2019   Procedure: BILIARY STENT PLACEMENT;  Surgeon: [name], MD;  Location: [facility] ENDOSCOPY;  Service: Endoscopy;;  . BIOPSY  03/29/2019   Procedure: BIOPSY;  Surgeon: Ardis Hughs,  Melene Plan, MD;  Location: [facility] ENDOSCOPY;  Service: Endoscopy;;  . BREAST LUMPECTOMY     2000  . ERCP N/A 03/29/2019   Procedure: ENDOSCOPIC RETROGRADE CHOLANGIOPANCREATOGRAPHY (ERCP);  Surgeon: [name], MD;  Location: [facility] ENDOSCOPY;  Service: Endoscopy;  Laterality: N/A;  . ESOPHAGOGASTRODUODENOSCOPY (EGD) WITH PROPOFOL N/A 03/29/2019   Procedure: ESOPHAGOGASTRODUODENOSCOPY (EGD) WITH PROPOFOL;  Surgeon: [name],  MD;  Location: [facility] ENDOSCOPY;  Service: Endoscopy;  Laterality: N/A;  . ESOPHAGOGASTRODUODENOSCOPY (EGD) WITH PROPOFOL N/A 06/09/2019   Procedure: ESOPHAGOGASTRODUODENOSCOPY (EGD) WITH PROPOFOL;  Surgeon: [name], MD;  Location: [facility] ENDOSCOPY;  Service: Endoscopy;  Laterality: N/A;  . EUS N/A 03/29/2019   Procedure: UPPER ENDOSCOPIC ULTRASOUND (EUS) LINEAR;  Surgeon: [name], MD;  Location: [facility] ENDOSCOPY;  Service: Endoscopy;  Laterality: N/A;  . EUS N/A 06/09/2019   Procedure: UPPER ENDOSCOPIC ULTRASOUND (EUS) LINEAR;  Surgeon: [name], MD;  Location: [facility] ENDOSCOPY;  Service: Endoscopy;  Laterality: N/A;  . FIDUCIAL MARKER PLACEMENT  06/09/2019   Procedure: FIDUCIAL MARKER PLACEMENT;  Surgeon: [name], MD;  Location: [facility] ENDOSCOPY;  Service: Endoscopy;;  . FINE NEEDLE ASPIRATION  03/29/2019   Procedure: FINE NEEDLE ASPIRATION (FNA);  Surgeon: [name], MD;  Location: [facility] ENDOSCOPY;  Service: Endoscopy;;  . HERNIA REPAIR     20+ years ago  . PORTACATH PLACEMENT N/A 04/12/2019   Procedure: INSERTION PORT-A-CATH LEFT SUBCLAVIAN;  Surgeon: Stark Klein, MD;  Location: [place];  Service: General;  Laterality: N/A;  . RETINAL DETACHMENT SURGERY    . SPHINCTEROTOMY  03/29/2019   Procedure: SPHINCTEROTOMY;  Surgeon: [name], MD;  [facility] ENDOSCOPY;  Service: Endoscopy;;    Social History:  reports that she has never smoked. She has never used smokeless tobacco. She reports that she does not drink alcohol or use drugs.  Allergies:  Allergies  Allergen Reactions  . Codeine Nausea Only    Medications Prior to Admission  Medication Sig Dispense Refill  . diltiazem (CARDIZEM CD) 180 MG 24 hr capsule Take 180 mg by mouth daily.    Marland Kitchen levothyroxine (SYNTHROID, LEVOTHROID) 50 MCG tablet Take 50 mcg by mouth every morning.    . mirtazapine (REMERON) 7.5 MG tablet Take 1 tablet (7.5 mg total) by mouth at bedtime. 30 tablet 1  . ondansetron  (ZOFRAN-ODT) 8 MG disintegrating tablet Take 1 tablet (8 mg total) by mouth every 8 (eight) hours as needed for nausea or vomiting. 20 tablet 0  . HYDROcodone-acetaminophen (NORCO/VICODIN) 5-325 MG tablet Take 0.5-1 tablets by mouth every 6 (six) hours as needed for moderate pain or severe pain. (Patient not taking: Reported on 06/13/2019) 8 tablet 0  . prochlorperazine (COMPAZINE) 10 MG tablet Take 10 mg by mouth every 6 (six) hours as needed for nausea or vomiting.      Prior to Admission medications   Medication Sig Start Date End Date Taking? Authorizing Provider  diltiazem (CARDIZEM CD) 180 MG 24 hr capsule Take 180 mg by mouth daily. 05/18/18  Yes [provider]  levothyroxine (SYNTHROID, LEVOTHROID) 50 MCG tablet Take 50 mcg by mouth every morning.   Yes [provider]  mirtazapine (REMERON) 7.5 MG tablet Take 1 tablet (7.5 mg total) by mouth at bedtime. 05/16/19  Yes Alla Feeling, NP  ondansetron (ZOFRAN-ODT) 8 MG disintegrating tablet Take 1 tablet (8 mg total) by mouth every 8 (eight) hours as needed for nausea or vomiting. 02/[birth year]/54  Yes Delora Fuel, MD  HYDROcodone-acetaminophen (NORCO/VICODIN) 5-325 MG tablet Take 0.5-1 tablets by mouth every 6 (six) hours as needed for moderate pain or severe pain. Patient not taking: Reported on 06/13/2019 04/12/19   Stark Klein, MD  prochlorperazine (COMPAZINE) 10 MG tablet Take 10 mg by mouth every 6 (six) hours as needed for nausea or vomiting.    [provider]  potassium chloride 20 MEQ/15ML (10%) SOLN Take 15 mLs (20 mEq total) by mouth daily. Patient not taking: Reported on 06/01/2019 05/04/19 06/12/19  Alla Feeling, NP    Blood pressure (!) 142/64, pulse 76, temperature 98.2 F (36.8 C), temperature source Oral, resp. rate 18, height 5\' 4"  (1.626 m), weight 53.5 kg, SpO2 97 %. Physical Exam: General: pleasant, WD/WN white female who is laying in bed in NAD HEENT: head is normocephalic, atraumatic.  Sclera are  noninjected.  Pupils equal and round.  Ears and nose without any masses or lesions.  Mouth is pink and moist. Dentition fair. NG tube in place and clamped Heart: regular, rate, and rhythm.  No obvious murmurs, gallops, or rubs noted.  Palpable pedal pulses bilaterally Lungs: CTAB, no wheezes, rhonchi, or rales noted.  Respiratory effort nonlabored Abd: soft, mild distension, nontender, +BS, no masses, hernias, or organomegaly MS: all 4 extremities are symmetrical with no cyanosis, clubbing, or edema. Skin: warm and dry with no masses, lesions, or rashes Psych: A&Ox3 with an appropriate affect. Neuro: cranial nerves grossly intact, extremity CSM intact bilaterally, normal speech  Results for orders placed or performed during the hospital encounter of 06/13/19 (from the past 48 hour(s))  SARS Coronavirus 2 (CEPHEID - Performed in [facility] hospital lab), Hosp Order     Status: None   Collection Time: 06/13/19  5:38 PM   Specimen: Nasopharyngeal Swab  Result Value Ref Range   SARS Coronavirus 2 NEGATIVE NEGATIVE    Comment: (NOTE) If result is NEGATIVE SARS-CoV-2 target nucleic acids are NOT DETECTED. The SARS-CoV-2 RNA is generally detectable in upper and lower  respiratory specimens during the acute phase of infection. The lowest  concentration of SARS-CoV-2 viral copies this assay can detect is 250  copies / mL. A negative result does not preclude SARS-CoV-2 infection  and should not be used as the sole basis for treatment or other  patient management decisions.  A negative result may occur with  improper specimen collection / handling, submission of specimen other  than nasopharyngeal swab, presence of viral mutation(s) within the  areas targeted by this assay, and inadequate number of viral copies  (<250 copies / mL). A negative result must be combined with clinical  observations, patient history, and epidemiological information. If result is POSITIVE SARS-CoV-2 target nucleic acids  are DETECTED. The SARS-CoV-2 RNA is generally detectable in upper and lower  respiratory specimens dur ing the acute phase of infection.  Positive  results are indicative of active infection with SARS-CoV-2.  Clinical  correlation with patient history and other diagnostic information is  necessary to determine patient infection status.  Positive results do  not rule out bacterial infection or co-infection with other viruses. If result is PRESUMPTIVE POSTIVE SARS-CoV-2 nucleic acids MAY BE PRESENT.   A presumptive positive result was obtained on the submitted specimen  and confirmed on repeat testing.  While 2019 novel coronavirus  (SARS-CoV-2) nucleic acids may be present in the submitted sample  additional confirmatory testing may be necessary for epidemiological  and / or clinical management purposes  to differentiate between  SARS-CoV-2 and other [place]ovirus  currently known to infect humans.  If clinically indicated additional testing with an alternate test  methodology [phone]) is advised. The SARS-CoV-2 RNA is generally  detectable in upper and lower respiratory sp ecimens during the acute  phase of infection. The expected result is Negative. Fact Sheet for Patients:  StrictlyIdeas.no Fact Sheet for Healthcare Providers: BankingDealers.co.za This test is not yet approved or cleared by the [place] FDA and has been authorized for detection and/or diagnosis of SARS-CoV-2 by FDA under an Emergency Use Authorization (EUA).  This EUA will remain in effect (meaning this test can be used) for the duration of the COVID-19 declaration under Section 564(b)(1) of the Act, 21 U.S.C. section 360bbb-3(b)(1), unless the authorization is terminated or revoked sooner. Performed at [facility], [place] [address], [place], [place] [address]   Urine culture     Status: None   Collection Time: 06/13/19  5:39 PM   Specimen:  Urine, Clean Catch  Result Value Ref Range   Specimen Description      URINE, CLEAN CATCH Performed at [facility], [place] [address], [place], [place] [address]    Special Requests      NONE Performed at [facility], [place] [address] [place], [place] [address]    Culture      NO GROWTH Performed at [facility], [place] [address] [place], [place] [address]    Report Status 06/15/2019 FINAL   Urinalysis, Complete w Microscopic     Status: Abnormal   Collection Time: 06/13/19  5:39 PM  Result Value Ref Range   Color, Urine AMBER (A) YELLOW    Comment: BIOCHEMICALS MAY BE AFFECTED BY COLOR   APPearance HAZY (A) CLEAR   Specific Gravity, Urine 1.029 1.005 - 1.030   pH 5.0 5.0 - 8.0   Glucose, UA NEGATIVE NEGATIVE mg/dL   Hgb urine dipstick NEGATIVE NEGATIVE   Bilirubin Urine NEGATIVE NEGATIVE   Ketones, ur 80 (A) NEGATIVE mg/dL   Protein, ur 30 (A) NEGATIVE mg/dL   Nitrite NEGATIVE NEGATIVE   Leukocytes,Ua MODERATE (A) NEGATIVE   RBC / HPF 0-5 0 - 5 RBC/hpf   WBC, UA 6-10 0 - 5 WBC/hpf   Bacteria, UA RARE (A) NONE SEEN   Squamous Epithelial / LPF 0-5 0 - 5   Mucus PRESENT    Hyaline Casts, UA PRESENT     Comment: Performed at [facility], [place] [address], [place], [place] [address]  CBC     Status: None   Collection Time: 06/13/19  7:21 PM  Result Value Ref Range   WBC 7.9 4.0 - 10.5 K/uL   RBC 4.25 3.87 - 5.11 MIL/uL   Hemoglobin 13.2 12.0 - 15.0 g/dL   HCT 41.5 36.0 - 46.0 %   MCV 97.6 80.0 - 100.0 fL   MCH 31.1 26.0 - 34.0 pg   MCHC 31.8 30.0 - 36.0 g/dL   RDW 13.4 11.5 - 15.5 %   Platelets 338 150 - 400 K/uL   nRBC 0.0 0.0 - 0.2 %    Comment: Performed at [facility], [place] [address], [place],  [address]  Creatinine, serum     Status: None   Collection Time: 06/13/19  7:21 PM  Result Value Ref Range   Creatinine, Ser 0.72 0.44 - 1.00 mg/dL   GFR calc non Af Amer >60 >60 mL/min    GFR calc Af Amer >60 >60 mL/min    Comment:  Performed at [facility], [place] [address], [place], [place] [address]  Lipase, blood     Status: Abnormal   Collection Time: 06/13/19  7:21 PM  Result Value Ref Range   Lipase 81 (H) 11 - 51 U/L    Comment: Performed at [facility], [place] [address] [place], [place] [address]  Magnesium     Status: None   Collection Time: 06/13/19  7:21 PM  Result Value Ref Range   Magnesium 2.0 1.7 - 2.4 mg/dL    Comment: Performed at [facility], [place] [address] [place], [place] [address]  Phosphorus     Status: None   Collection Time: 06/13/19  7:21 PM  Result Value Ref Range   Phosphorus 3.4 2.5 - 4.6 mg/dL    Comment: Performed at [facility], [place] [address], [place], [place] [address]  Magnesium     Status: None   Collection Time: 06/14/19  4:09 AM  Result Value Ref Range   Magnesium 2.1 1.7 - 2.4 mg/dL    Comment: Performed at [facility], [place] [address] [place], [place] [address]  Phosphorus     Status: None   Collection Time: 06/14/19  4:09 AM  Result Value Ref Range   Phosphorus 4.0 2.5 - 4.6 mg/dL    Comment: Performed at [facility], [place] [address] [place], [place] [address]  Comprehensive metabolic panel     Status: Abnormal   Collection Time: 06/14/19  4:09 AM  Result Value Ref Range   Sodium 142 135 - 145 mmol/L   Potassium 3.5 3.5 - 5.1 mmol/L   Chloride 100 98 - 111 mmol/L   CO2 30 22 - 32 mmol/L   Glucose, Bld 96 70 - 99 mg/dL   BUN 20 8 - 23 mg/dL   Creatinine, Ser 0.66 0.44 - 1.00 mg/dL   Calcium 8.4 (L) 8.9 - 10.3 mg/dL   Total Protein 5.4 (L) 6.5 - 8.1 g/dL   Albumin 3.0 (L) 3.5 - 5.0 g/dL   AST 22 15 - 41 U/L   ALT 22 0 - 44 U/L   Alkaline Phosphatase 99 38 - 126 U/L   Total Bilirubin 0.5 0.3 - 1.2 mg/dL   GFR calc non Af Amer >60 >60 mL/min   GFR calc Af Amer >60 >60 mL/min   Anion gap 12 5 - 15     Comment: Performed at [facility], [place] [address] [place], [place] [address]  CBC     Status: Abnormal   Collection Time: 06/14/19  4:09 AM  Result Value Ref Range   WBC 7.4 4.0 - 10.5 K/uL   RBC 3.44 (L) 3.87 - 5.11 MIL/uL   Hemoglobin 11.0 (L) 12.0 - 15.0 g/dL   HCT 33.9 (L) 36.0 - 46.0 %   MCV 98.5 80.0 - 100.0 fL   MCH 32.0 26.0 - 34.0 pg   MCHC 32.4 30.0 - 36.0 g/dL   RDW 13.6 11.5 - 15.5 %   Platelets 253 150 - 400 K/uL   nRBC 0.0 0.0 - 0.2 %    Comment: Performed at [facility], [place] [address] [place], [place] [address]  Glucose, capillary     Status: None   Collection Time: 06/14/19  8:16 AM  Result Value Ref Range   Glucose-Capillary 76 70 - 99 mg/dL  Glucose, capillary     Status: None   Collection Time: 06/14/19  3:44  PM  Result Value Ref Range   Glucose-Capillary 79 70 - 99 mg/dL  Glucose, capillary     Status: Abnormal   Collection Time: 06/14/19  9:01 PM  Result Value Ref Range   Glucose-Capillary 65 (L) 70 - 99 mg/dL  Glucose, capillary     Status: Abnormal   Collection Time: 06/14/19 10:17 PM  Result Value Ref Range   Glucose-Capillary 130 (H) 70 - 99 mg/dL  Glucose, capillary     Status: None   Collection Time: 06/15/19  1:25 AM  Result Value Ref Range   Glucose-Capillary 75 70 - 99 mg/dL  CBC with Differential/Platelet     Status: Abnormal   Collection Time: 06/15/19  4:03 AM  Result Value Ref Range   WBC 5.6 4.0 - 10.5 K/uL   RBC 3.07 (L) 3.87 - 5.11 MIL/uL   Hemoglobin 9.8 (L) 12.0 - 15.0 g/dL   HCT 31.4 (L) 36.0 - 46.0 %   MCV 102.3 (H) 80.0 - 100.0 fL   MCH 31.9 26.0 - 34.0 pg   MCHC 31.2 30.0 - 36.0 g/dL   RDW 13.6 11.5 - 15.5 %   Platelets 215 150 - 400 K/uL   nRBC 0.0 0.0 - 0.2 %   Neutrophils Relative % 61 %   Neutro Abs 3.4 1.7 - 7.7 K/uL   Lymphocytes Relative 22 %   Lymphs Abs 1.2 0.7 - 4.0 K/uL   Monocytes Relative 11 %   Monocytes Absolute 0.6 0.1 - 1.0 K/uL   Eosinophils Relative 5 %    Eosinophils Absolute 0.3 0.0 - 0.5 K/uL   Basophils Relative 1 %   Basophils Absolute 0.1 0.0 - 0.1 K/uL   Immature Granulocytes 0 %   Abs Immature Granulocytes 0.01 0.00 - 0.07 K/uL    Comment: Performed at [facility], [place] [address], [place], [place] [address]  Comprehensive metabolic panel     Status: Abnormal   Collection Time: 06/15/19  4:03 AM  Result Value Ref Range   Sodium 143 135 - 145 mmol/L   Potassium 3.4 (L) 3.5 - 5.1 mmol/L   Chloride 109 98 - 111 mmol/L   CO2 24 22 - 32 mmol/L   Glucose, Bld 67 (L) 70 - 99 mg/dL   BUN 14 8 - 23 mg/dL   Creatinine, Ser 0.51 0.44 - 1.00 mg/dL   Calcium 8.3 (L) 8.9 - 10.3 mg/dL   Total Protein 5.2 (L) 6.5 - 8.1 g/dL   Albumin 2.7 (L) 3.5 - 5.0 g/dL   AST 22 15 - 41 U/L   ALT 22 0 - 44 U/L   Alkaline Phosphatase 91 38 - 126 U/L   Total Bilirubin 0.8 0.3 - 1.2 mg/dL   GFR calc non Af Amer >60 >60 mL/min   GFR calc Af Amer >60 >60 mL/min   Anion gap 10 5 - 15    Comment: Performed at [facility], [place] [address], [place], [place] [address]  Magnesium     Status: None   Collection Time: 06/15/19  4:03 AM  Result Value Ref Range   Magnesium 2.2 1.7 - 2.4 mg/dL    Comment: Performed at [facility], [place] [address], [place], [place] [address]  Phosphorus     Status: None   Collection Time: 06/15/19  4:03 AM  Result Value Ref Range   Phosphorus 3.1 2.5 - 4.6 mg/dL    Comment: Performed at [facility], [place] [address]  [place], [place] [address]  Reticulocytes     Status: Abnormal   Collection Time: 06/15/19  4:03 AM  Result Value Ref Range   Retic Ct Pct 0.7 0.4 - 3.1 %   RBC. 3.07 (L) 3.87 - 5.11 MIL/uL   Retic Count, Absolute 20.9 19.0 - 186.0 K/uL   Immature Retic Fract 11.9 2.3 - 15.9 %    Comment: Performed at [facility], [place] [address], [place], [place] [address]  Vitamin B12     Status: None   Collection Time: 06/15/19  4:04 AM   Result Value Ref Range   Vitamin B-12 766 180 - 914 pg/mL    Comment: (NOTE) This assay is not validated for testing neonatal or myeloproliferative syndrome specimens for Vitamin B12 levels. Performed at [facility], [place] [address] [place], [place] [address]   Folate     Status: None   Collection Time: 06/15/19  4:04 AM  Result Value Ref Range   Folate 25.6 >5.9 ng/mL    Comment: RESULTS CONFIRMED BY MANUAL DILUTION Performed at [place] [address] [place], [place] [address]   Iron and TIBC     Status: Abnormal   Collection Time: 06/15/19  4:04 AM  Result Value Ref Range   Iron 44 28 - 170 ug/dL   TIBC 205 (L) 250 - 450 ug/dL   Saturation Ratios 22 10.4 - 31.8 %   UIBC 161 ug/dL    Comment: Performed at [facility], [place] [address], [place], [place] [address]  Ferritin     Status: None   Collection Time: 06/15/19  4:04 AM  Result Value Ref Range   Ferritin 94 11 - 307 ng/mL    Comment: Performed at [facility], [place] [address] [place], [place] [address]  TSH     Status: None   Collection Time: 06/15/19  4:04 AM  Result Value Ref Range   TSH 1.191 0.350 - 4.500 uIU/mL    Comment: Performed by a 3rd Generation assay with a functional sensitivity of <=0.01 uIU/mL. Performed at [facility], [place] [address] [place], [place] [address]   Glucose, capillary     Status: Abnormal   Collection Time: 06/15/19  4:19 AM  Result Value Ref Range   Glucose-Capillary 61 (L) 70 - 99 mg/dL  Glucose, capillary     Status: Abnormal   Collection Time: 06/15/19  4:55 AM  Result Value Ref Range   Glucose-Capillary 172 (H) 70 - 99 mg/dL  Glucose, capillary     Status: Abnormal   Collection Time: 06/15/19  7:46 AM  Result Value Ref Range   Glucose-Capillary 138 (H) 70 - 99 mg/dL  Glucose, capillary     Status: Abnormal   Collection Time: 06/15/19 11:42 AM  Result Value Ref Range    Glucose-Capillary 121 (H) 70 - 99 mg/dL   Dg Abd 1 View  Result Date: 06/13/2019 CLINICAL DATA:  NG tube placement. EXAM: ABDOMEN - 1 VIEW COMPARISON:  Radiograph yesterday. CT 05/20/2019 FINDINGS: Tip of the enteric tube below the diaphragm in the stomach, side-port in the region of the gastroesophageal junction. Mild gaseous gastric distension. No other bowel dilatation. Biliary stent in the right upper quadrant with pneumobilia. No free air. IMPRESSION: Tip of the enteric tube below the diaphragm in the stomach, side-port in the region of the gastroesophageal junction. Advancement of 3-5 cm would lead to optimal placement. Electronically Signed   By: Threasa Beards  [place] M.D.   On: 06/13/2019 19:59   Dg Abd 2 Views  Result Date: 06/13/2019 CLINICAL DATA:  Pancreatic cancer. Partial gastric outlet obstruction on EGD. Nausea and vomiting. EXAM: ABDOMEN - 2 VIEW COMPARISON:  May 20, 2019 FINDINGS: The common bile duct stent is stable. Pneumobilia is seen as expected. No other acute abnormalities. IMPRESSION: Stable common bile duct stent and pneumobilia. No other acute abnormalities. Electronically Signed   By: Dorise Bullion III M.D   On: 06/13/2019 15:07   Dg Duanne Limerick W Single Cm (sol Or Thin Ba)  Result Date: 06/14/2019 CLINICAL DATA:  Gastric outlet obstruction? History of pancreatic cancer. EXAM: UPPER GI SERIES WITHOUT KUB TECHNIQUE: Routine upper GI series was performed with water soluble barium. FLUOROSCOPY TIME:  Fluoroscopy Time:  2 minutes and 1 second 14.4 mGy COMPARISON:  None. FINDINGS: Contrast was administered into the stomach via the indwelling nasogastric tube. Stomach was adequately distended with contrast revealing no mass, ulceration or other wall irregularity. Contrast passed through the normal caliber duodenum without evidence of obstruction or dysmotility. Incidental note made of reflux of contrast into the biliary system, presumably related to the recent stent placement and presumed  sphincterotomy. IMPRESSION: 1. No gastric outlet obstruction. 2. No evidence of duodenal obstruction. Electronically Signed   By: Franki Cabot M.D.   On: 06/14/2019 14:13   Anti-infectives (From admission, onward)   None        Assessment/Plan Hx of Breast Cancer in 2000 with Lumpectomy and Radiation  Hypothyroidism Anxiety  Pancreatic cancer - diagnosed 03/2019, s/p 2 cycles Folfirinox but this was stopped due to poor tolerance. Supposed to start stereotactic radiation but was admitted with n/v. Followed by Dr. [name].  Partial Gastric outlet obstruction - Patient with symptoms of partial gastric outlet obstruction. She needs to improve her nutritional status prior to Whipple in the future. Discussed with Dr. Barry Dienes, considering J vs GJ placement in the OR this week. The other option may be TPN. Check prealbumin. Dr. Barry Dienes to see and discuss plan with patient.  ID - none VTE - SCDs, sq heparin FEN - IVF, NG tube clamped, CLD Foley - none Follow up - TBD  Wellington Hampshire, [facility] 06/15/2019, 2:32 PM Pager: [phone] Mon-Thurs 7:00 am-4:30 pm Fri 7:00 am -11:30 AM Sat-Sun 7:00 am-11:30 am

## 2019-06-15 NOTE — Progress Notes (Addendum)
HEMATOLOGY-ONCOLOGY PROGRESS NOTE  SUBJECTIVE: NG tube remains in place.  It was clamped about 10 minutes prior to my visit.  She has been started on clear liquids today.  Currently, she has no abdominal pain, nausea, vomiting.  Last bowel movement was yesterday.  She has no other complaints this morning.  Oncology History  Malignant neoplasm of head of pancreas ([place])  03/28/2019 Imaging   CT AP w contrast IMPRESSION: Interval development of 20 x 14 mm ill-defined low density in pancreatic head concerning for possible neoplasm or malignancy. This appears to be resulting in severe intrahepatic and extrahepatic biliary dilatation. ERCP is recommended for further evaluation.  Sigmoid diverticulosis without inflammation.   03/29/2019 Initial Biopsy   Diagnosis FINE NEEDLE ASPIRATION, ENDOSCOPIC EGUS, PANCREATIC HEAD MASS (SPECIMEN 1 OF 2 COLLECTED 03/29/2019) MALIGNANT CELLS CONSISTENT WITH ADENOCARCINOMA.   03/29/2019 Initial Biopsy   Diagnosis Duodenum, Biopsy, abnormal mucosa at major papilla - ADENOCARCINOMA. SEE NOTE Diagnosis Note The duodenal mucosa is negative for dysplasia. The findings are consistent with duodenal involvement from patient's suspected primary pancreatic adenocarcinoma. Dr. Melina Copa has reviewed this case and concurs with the above interpretation. Dr. Ardis Hughs was notified on 03/30/2019.   03/29/2019 Pathology Results   Diagnosis BILE DUCT BRUSHING (SPECIMEN 2 OF 2, COLLECTED ON 03/29/2019): HIGHLY SUSPICIOUS FOR MALIGNANCY.   03/29/2019 Procedure   ERCP with stenting of malignant distal CBD stricture per Dr. Ardis Hughs   04/04/2019 Initial Diagnosis   Malignant neoplasm of head of pancreas ([place])   04/08/2019 Imaging   CT Chest IMPRESSION: No evidence of metastatic disease in the chest. Aortic Atherosclerosis (ICD10-I70.0).    04/13/2019 - 05/10/2019 Chemotherapy   neoadjuvant FOLFIRINOX q3weeks beginning 04/13/19; dose-reduced with cycle 1   06/08/2019 Genetic Testing    Positive genetic testing. 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 The Common Hereditary Cancers Panel offered by [facility] includes sequencing and/or deletion duplication testing of the following 48 genes: APC, ATM, AXIN2, BARD1, BMPR1A, BRCA1, BRCA2, BRIP1, CDH1, CDKN2A (p14ARF), CDKN2A (p16INK4a), CKD4, CHEK2, CTNNA1, DICER1, EPCAM (Deletion/duplication testing only), GREM1 (promoter region deletion/duplication testing only), KIT, MEN1, MLH1, MSH2, MSH3, MSH6, MUTYH, NBN, NF1, NHTL1, PALB2, PDGFRA, PMS2, POLD1, POLE, PTEN, RAD50, RAD51C, RAD51D, RNF43, SDHB, SDHC, SDHD, SMAD4, SMARCA4. STK11, TP53, TSC1, TSC2, and VHL.  The following genes were evaluated for sequence changes only: SDHA and HOXB13 c.251G>A variant only. The report date is 06/08/2019.      REVIEW OF SYSTEMS:   Constitutional: Denies fevers, chills  Respiratory: Denies cough, dyspnea or wheezes Cardiovascular: Denies palpitation, chest discomfort Gastrointestinal: Denies abdominal pain, nausea, vomiting Neurological:Denies numbness, tingling or new weaknesses Behavioral/Psych: Mood is stable, no new changes  Extremities: No lower extremity edema All other systems were reviewed with the patient and are negative.  I have reviewed the past medical history, past surgical history, social history and family history with the patient and they are unchanged from previous note.   PHYSICAL EXAMINATION:  Vitals:   06/15/19 0423 06/15/19 1247  BP: (!) 142/66 (!) 142/64  Pulse: 82 76  Resp: 16 18  Temp: 98.5 F (36.9 C) 98.2 F (36.8 C)  SpO2: 98% 97%   Filed Weights   06/13/19 1933 06/14/19 0441 06/15/19 0423  Weight: 118 lb 6.4 oz (53.7 kg) 114 lb 13.8 oz (52.1 kg) 117 lb 14.4 oz (53.5 kg)    Intake/Output from previous day: 07/21 0701 - 07/22 0700 In: 2569.3 [I.V.:2263; IV Piggyback:306.3] Out: 300 [Emesis/NG output:300]  GENERAL: Thin,  chronically ill female.  Alert, no distress and comfortable LYMPH:  no palpable lymphadenopathy in the cervical, axillary or inguinal LUNGS: clear to auscultation and percussion with normal breathing effort HEART: regular rate & rhythm and no murmurs and no lower extremity edema ABDOMEN:abdomen soft, non-tender and normal bowel sounds Musculoskeletal:no cyanosis of digits and no clubbing  NEURO: alert & oriented x 3 with fluent speech, no focal motor/sensory deficits  LABORATORY DATA:  I have reviewed the data as listed CMP Latest Ref Rng & Units 06/15/2019 06/14/2019 06/13/2019  Glucose 70 - 99 mg/dL 67(L) 96 121(H)  BUN 8 - 23 mg/dL _0 Creatinine 0.44 - 1.00 mg/dL 0.51 0.66 0.72  Sodium 135 - 145 mmol/L 143 142 142  Potassium 3.5 - 5.1 mmol/L 3.4(L) 3.5 3.2(L)  Chloride 98 - 111 mmol/L 109 100 98  CO2 22 - 32 mmol/L 24 30 32  Calcium 8.9 - 10.3 mg/dL 8.3(L) 8.4(L) 9.9  Total Protein 6.5 - 8.1 g/dL 5.2(L) 5.4(L) 6.7  Total Bilirubin 0.3 - 1.2 mg/dL 0.8 0.5 0.4  Alkaline Phos 38 - 126 U/L 91 99 142(H)  AST 15 - 41 U/L _1 ALT 0 - 44 U/L _2 Lab Results  Component Value Date   WBC 5.6 06/15/2019   HGB 9.8 (L) 06/15/2019   HCT 31.4 (L) 06/15/2019   MCV 102.3 (H) 06/15/2019   PLT 215 06/15/2019   NEUTROABS 3.4 06/15/2019    Dg Abd 1 View  Result Date: 06/13/2019 CLINICAL DATA:  NG tube placement. EXAM: ABDOMEN - 1 VIEW COMPARISON:  Radiograph yesterday. CT 05/20/2019 FINDINGS: Tip of the enteric tube below the diaphragm in the stomach, side-port in the region of the gastroesophageal junction. Mild gaseous gastric distension. No other bowel dilatation. Biliary stent in the right upper quadrant with pneumobilia. No free air. IMPRESSION: Tip of the enteric tube below the diaphragm in the stomach, side-port in the region of the gastroesophageal junction. Advancement of 3-5 cm would lead to optimal placement. Electronically Signed   By: Keith Rake M.D.   On:  06/13/2019 19:59   Ct Abdomen Pelvis W Contrast  Result Date: 05/20/2019 CLINICAL DATA:  Ongoing chemotherapy for pancreatic cancer. Stent placement. Loss of appetite. Left lower quadrant abdominal pain. EXAM: CT ABDOMEN AND PELVIS WITH CONTRAST TECHNIQUE: Multidetector CT imaging of the abdomen and pelvis was performed using the standard protocol following bolus administration of intravenous contrast. CONTRAST:  123m OMNIPAQUE IOHEXOL 300 MG/ML  SOLN COMPARISON:  03/28/2019 FINDINGS: Lower chest: Stable scarring in both lung bases. Stable lipoma in the left latissimus dorsi muscle. Hepatobiliary: Diffuse hepatic steatosis. 0.7 by 0.6 cm lesion in the right hepatic lobe on image 44/2 with arterial phase enhancement and delayed phase enhancement, no change from 11/19/2006, compatible with benign process such as hemangioma. Mild wall thickening of the gallbladder noted. A common bile duct expandable stent is present extending into the duodenum. Pneumobilia noted. Pancreas: Minimally dilated dorsal pancreatic duct. Indistinctly marginated hypodensity compatible with infiltrative pancreatic neoplasm measures approximately 1.9 by 1.6 cm on image 48/2. This had a slightly different configuration previously and by my measurement was previously about 1.7 by 1.2 cm. The amount of dorsal pancreatic duct dilatation is mildly increased. No appreciable involvement of regional vasculature Spleen: Unremarkable Adrenals/Urinary Tract: Small cyst of the left kidney lower pole. Hypodense lesion in the right mid kidney measuring 4 mm in diameter on image 51/5 is technically too small to characterize although statistically  likely to be a cyst. Adrenal glands unremarkable. Urinary bladder is empty during imaging. Stomach/Bowel: Sigmoid colon diverticulosis. Vascular/Lymphatic: Conventional proper hepatic artery arising from the celiac trunk. No appreciable major vascular involvement by the pancreatic tumor. Small porta hepatis and  peripancreatic lymph nodes are not pathologically enlarged. Reproductive: Uterus absent.  Adnexa unremarkable. Other: Mild stranding of the mesenteric adipose tissue ("misty mesentery") is present. This can be idiopathic, due to adjacent inflammation such as enteritis, from mild sclerosing mesenteritis, or rarely from other conditions such as cirrhosis, mesenteric lymphoma, or mesenteric venous thrombosis. 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 Musculoskeletal: Degenerative hip arthropathy bilaterally. Sternal deformity from an old fracture. Stable wedge compression fracture at T12. Grade 1 degenerative anterolisthesis at L4-5. IMPRESSION: 1. Mild increase in size of the infiltrative pancreatic head malignancy currently 1.9 by 1.6 cm, previously 1.7 by 1.2 cm by my measurements. No direct major vascular involvement or distant metastatic spread identified. The margin of the tumor is traversed by metallic stent, with resulting pneumobilia. Mildly increased dorsal pancreatic duct dilatation. 2. Diffuse hepatic steatosis. 3. Small hemangioma inferiorly in the right hepatic lobe, stable from 2007. 4. Mild wall thickening of the gallbladder, nonspecific. 5. 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 6. Other imaging findings of potential clinical significance: Sigmoid colon diverticulosis. Degenerative hip arthropathy bilaterally. Old healed sternal fracture. Stable wedge compression fracture at T12. Grade 1 anterolisthesis at L4-5. Electronically Signed   By: Van Clines M.D.   On: 05/20/2019 08:55   Dg Abd 2 Views  Result Date: 06/13/2019 CLINICAL DATA:  Pancreatic cancer. Partial gastric outlet obstruction on EGD. Nausea and vomiting. EXAM: ABDOMEN - 2 VIEW COMPARISON:  May 20, 2019 FINDINGS: The common bile duct stent is stable. Pneumobilia is seen as expected. No other acute abnormalities. IMPRESSION: Stable common bile  duct stent and pneumobilia. No other acute abnormalities. Electronically Signed   By: Dorise Bullion III M.D   On: 06/13/2019 15:07   Dg Duanne Limerick W Single Cm (sol Or Thin Ba)  Result Date: 06/14/2019 CLINICAL DATA:  Gastric outlet obstruction? History of pancreatic cancer. EXAM: UPPER GI SERIES WITHOUT KUB TECHNIQUE: Routine upper GI series was performed with water soluble barium. FLUOROSCOPY TIME:  Fluoroscopy Time:  2 minutes and 1 second 14.4 mGy COMPARISON:  None. FINDINGS: Contrast was administered into the stomach via the indwelling nasogastric tube. Stomach was adequately distended with contrast revealing no mass, ulceration or other wall irregularity. Contrast passed through the normal caliber duodenum without evidence of obstruction or dysmotility. Incidental note made of reflux of contrast into the biliary system, presumably related to the recent stent placement and presumed sphincterotomy. IMPRESSION: 1. No gastric outlet obstruction. 2. No evidence of duodenal obstruction. Electronically Signed   By: Franki Cabot M.D.   On: 06/14/2019 14:13    ASSESSMENT AND PLAN: 1.  Pancreatic adenocarcinoma 2.  Partial gastric outlet obstruction 3.  Nausea and vomiting 4.  Hypokalemia 5.  Mild anemia   -Currently on supportive care for her pancreatic cancer.  Under consideration to receive SBRT prior to surgical resection.  Radiation oncology has been notified of the patient's admission.  They will plan to proceed with simulation tomorrow as planned while the patient remains inpatient. -Upper GI series was negative.  GI following and has had a schedule Zofran. -NG tube has been clamped.  Continue to monitor for recurrent abdominal pain, nausea, vomiting. -Replete potassium per hospitalist. -The patient has mild anemia which  is likely dilutional and due to underlying malignancy.  No evidence of iron deficiency, vitamin B12 deficiency, or folate deficiency.   LOS: 2 days   Mikey Bussing, DNP,  AGPCNP-BC, AOCNP 06/15/19   Addendum  I have seen the patient, examined her. I agree with the assessment and and plan and have edited the notes.   Her upper GI series were unremarkable yesterday, patient has persistent nausea, NG tube was clamped this morning. I reviewed her lab and recent EGD findings with her. She has endoscopy evidence of partial duodenal obstruction from her pancreatic mass, and persistent nausea and vomiting, her malnutrition is getting worse.  She would not be a candidate for Whipple surgery, we did not improve her nutrition.  I will discussed with GI and Dr. Barry Dienes about the possibility of a feeding tube, either J or GJ tube for her nutrition improvement.  I also reviewed her recent genetic test results, which was positive for BRCA2 mutation. I would consider PARP2 inhibitor for her pancreatic cancer, if I can get it approved by her insurance, give her limited systemic treatment options (she is not a candidate for chemo now). She will start radiation soon as neoadjuvant soon. Continue supportive care. I appreciate the hospitalist team's care and assistance.   Karyl Kinnier  06/15/2019

## 2019-06-15 NOTE — Progress Notes (Signed)
NGT clamped at 1020 per GI MD order. Scheduled Zofran given and pt requested a Coke. C/o mild nausea- pt agreed to let Zofran take effect before drinking Coke. Will continue to monitor closely.

## 2019-06-15 NOTE — Progress Notes (Signed)
Julie Rollins is a well known patient of our's who has a stage I pancreatic cancer who did not tolerate chemotherapy but is planning SBRT followed by Whipple with Dr. Barry Dienes. She's currently admitted with what sounds to be intermittent episodes of gastric outlet obstruction. Fortunately her films are reassuring that there's not an active obstruction. I've spoken with Dr. Benny Lennert about her situation and am going to reach out to Dr. Ardis Hughs who placed her fiducial markers, and Dr. Barry Dienes as well. We would still like to try to proceed with definitive treatment given her early staging rather than only palliative treatment since we still hope to cure her disease.    Carola Rhine, PAC

## 2019-06-15 NOTE — Progress Notes (Signed)
Report received from previous RN. Agree with assessment. NGT is clamped. Patient attempting clear liquids. Will monitor.

## 2019-06-15 NOTE — Progress Notes (Signed)
Hypoglycemic Event  CBG: 61  Treatment: Dextrose 50% solution 12.5 g @ 0430  Symptoms: Light headedness  Follow-up CBG: XAFH:[id number] CBG Result:172  Possible Reasons for Event: Pt NPO  Comments/MD notified: New order for D5NS @100     Carmin Richmond

## 2019-06-15 NOTE — Progress Notes (Addendum)
Progress Note    ASSESSMENT AND PLAN:   43. 73 yo female with pancreatic head cancer without metastasis  diagnosed in May after presenting with jaundice. She is s/p biliary stent placement. Didn't tolerate chemotherapy but to start radiation this week. Plan is for Whipple with Dr. Barry Dienes.    2. Nausea / vomiting, thought possibly due to a partial GOO but UGI series yesterday was negative. Despite negative UGI patient to continue NGT decompression overnight as it made her feel better. -tried ice chips this am. Will clamp NGT, try clears now. If tolerates then d/c NGT and slowly advance diet - Scheduled IV Zofran    3. Minor rectal bleeding on Sq heparin. Hemorrhoids on exam. Complains of black stool but on DRE stool is LIGHT brown -anusol bid     SUBJECTIVE   Complains of black stool and bright red blood per rectum ( thinks hemorrhoids). Just starting to try ice chip   OBJECTIVE:     Vital signs in last 24 hours: Temp:  [97.7 F (36.5 C)-98.8 F (37.1 C)] 98.5 F (36.9 C) (07/22 0423) Pulse Rate:  [82-94] 82 (07/22 0423) Resp:  [16] 16 (07/22 0423) BP: (120-142)/(53-66) 142/66 (07/22 0423) SpO2:  [96 %-99 %] 98 % (07/22 0423) Weight:  [53.5 kg] 53.5 kg (07/22 0423) Last BM Date: 06/14/19 General:   Alert, well-developed female in NAD. NGT >> dark green liquid EENT:  Normal hearing, non icteric sclera, conjunctive pink.  Heart:  Regular rate and rhythm.  No lower extremity edema   Pulm: Normal respiratory effort Abdomen:  Soft, nondistended, nontender.  Normal bowel sounds,.  Neurologic:  Alert and  oriented x4;  grossly normal neurologically. Psych:  Pleasant, cooperative.  Normal mood and affect.   Intake/Output from previous day: 07/21 0701 - 07/22 0700 In: 2569.3 [I.V.:2263; IV Piggyback:306.3] Out: 300 [Emesis/NG output:300] Intake/Output this shift: No intake/output data recorded.  Lab Results: Recent Labs    06/13/19 1921 06/14/19 0409 06/15/19 0403   WBC 7.9 7.4 5.6  HGB 13.2 11.0* 9.8*  HCT 41.5 33.9* 31.4*  PLT 338 253 215   BMET Recent Labs    06/13/19 1321 06/13/19 1921 06/14/19 0409 06/15/19 0403  NA 142  --  142 143  K 3.2*  --  3.5 3.4*  CL 98  --  100 109  CO2 32  --  30 24  GLUCOSE 121*  --  96 67*  BUN 17  --  20 14  CREATININE 0.72 0.72 0.66 0.51  CALCIUM 9.9  --  8.4* 8.3*   LFT Recent Labs    06/15/19 0403  PROT 5.2*  ALBUMIN 2.7*  AST 22  ALT 22  ALKPHOS 91  BILITOT 0.8   Dg Abd 1 View  Result Date: 06/13/2019 CLINICAL DATA:  NG tube placement. EXAM: ABDOMEN - 1 VIEW COMPARISON:  Radiograph yesterday. CT 05/20/2019 FINDINGS: Tip of the enteric tube below the diaphragm in the stomach, side-port in the region of the gastroesophageal junction. Mild gaseous gastric distension. No other bowel dilatation. Biliary stent in the right upper quadrant with pneumobilia. No free air. IMPRESSION: Tip of the enteric tube below the diaphragm in the stomach, side-port in the region of the gastroesophageal junction. Advancement of 3-5 cm would lead to optimal placement. Electronically Signed   By: Keith Rake M.D.   On: 06/13/2019 19:59   Dg Abd 2 Views  Result Date: 06/13/2019 CLINICAL DATA:  Pancreatic cancer. Partial gastric outlet obstruction on EGD.  Nausea and vomiting. EXAM: ABDOMEN - 2 VIEW COMPARISON:  May 20, 2019 FINDINGS: The common bile duct stent is stable. Pneumobilia is seen as expected. No other acute abnormalities. IMPRESSION: Stable common bile duct stent and pneumobilia. No other acute abnormalities. Electronically Signed   By: Dorise Bullion III M.D   On: 06/13/2019 15:07   Dg Duanne Limerick W Single Cm (sol Or Thin Ba)  Result Date: 06/14/2019 CLINICAL DATA:  Gastric outlet obstruction? History of pancreatic cancer. EXAM: UPPER GI SERIES WITHOUT KUB TECHNIQUE: Routine upper GI series was performed with water soluble barium. FLUOROSCOPY TIME:  Fluoroscopy Time:  2 minutes and 1 second 14.4 mGy COMPARISON:   None. FINDINGS: Contrast was administered into the stomach via the indwelling nasogastric tube. Stomach was adequately distended with contrast revealing no mass, ulceration or other wall irregularity. Contrast passed through the normal caliber duodenum without evidence of obstruction or dysmotility. Incidental note made of reflux of contrast into the biliary system, presumably related to the recent stent placement and presumed sphincterotomy. IMPRESSION: 1. No gastric outlet obstruction. 2. No evidence of duodenal obstruction. Electronically Signed   By: Franki Cabot M.D.   On: 06/14/2019 14:13    Active Problems:   Generalized weakness   Abnormal finding on GI tract imaging   Pancreatic mass   Hypothyroidism   Malignant neoplasm of head of pancreas ([facility])   Personal history of breast cancer   Intractable nausea and vomiting   Partial gastric outlet obstruction     LOS: 2 days   Tye Savoy ,NP 06/15/2019, 9:58 AM  GI ATTENDING  Interval history data reviewed.  Patient personally seen and examined.  Interval data reviewed including upper GI series.  Patient continues with upset stomach and nausea.  NG tube remains in place--being clamped currently.  No gross outlet obstruction on imaging.  Biliary stent in place.  On antiemetics.  She was not on PPI, but we have added this today.  Possible etiologies for her symptoms include partial outlet obstructive symptoms underestimated by radiology, medical nausea and vomiting related to underlying medical problems and recent therapies, or interval pathology such as pyloric channel ulcer, reflux esophagitis, or Candida esophagitis.  She has not had strict endoscopic evaluation of her esophagus and upper GI mucosa having had recent examinations with side-viewing scope and echoendoscope.  Would consider diagnostic EGD if symptoms persist to rule out pathology that may explain symptoms and potentially help direct treatment.  I discussed this with her.   Docia Chuck. Geri Seminole., M.D. [facility] Division of Gastroenterology

## 2019-06-15 NOTE — Evaluation (Addendum)
SLP Cancellation Note  Patient Details Name: Julie Rollins MRN: [id number] DOB: Aug 12, 1946   Cancelled treatment:       Reason Eval/Treat Not Completed: (Pt has NG in place and is on clear liquid diet at this time, unless concern for oropharyngeal dysphagia present, SlP swallow evaluation likely not needed.  Will await for MD clarification.   Macario Golds 06/15/2019, 3:20 PM   Luanna Salk, [facility] SLP Acute Rehab Services Pager [phone] Office [phone]

## 2019-06-15 NOTE — Progress Notes (Signed)
NGT returned to low intermittent suction as instructed by on-call surgeon.

## 2019-06-15 NOTE — Progress Notes (Signed)
Instructed per report to leave NGT clamped. Pt c/o nausea and requesting to reconnect to suction. Intermittent suction restarted for now and 367ml pink tinged fluid immediately removed. Pt report improved comfort. Schorr, NP paged. Will notify surgery as instructed and continue to monitor.

## 2019-06-15 NOTE — Progress Notes (Signed)
Patient with c/o swelling on abd. Upon assessment, noticed patient is developing a small hematoma. Given ice pack to site. Notified Dr.Swayze. Orders placed to d/c heparin. Pt advised.

## 2019-06-16 ENCOUNTER — Ambulatory Visit
Admission: RE | Admit: 2019-06-16 | Discharge: 2019-06-16 | Disposition: A | Discharge status: Home / Self Care | Payer: Medicare HMO | Source: Ambulatory Visit | Attending: Radiation Oncology | Admitting: Radiation Oncology

## 2019-06-16 ENCOUNTER — Ambulatory Visit: Payer: Medicare HMO

## 2019-06-16 ENCOUNTER — Inpatient Hospital Stay: Payer: Self-pay

## 2019-06-16 DIAGNOSIS — C250 Malignant neoplasm of head of pancreas: Secondary | ICD-10-CM | POA: Insufficient documentation

## 2019-06-16 DIAGNOSIS — Z510 Encounter for antineoplastic radiation therapy: Secondary | ICD-10-CM | POA: Insufficient documentation

## 2019-06-16 LAB — CBC WITH DIFFERENTIAL/PLATELET
Abs Immature Granulocytes: 0.01 10*3/uL (ref 0.00–0.07)
Basophils Absolute: 0 10*3/uL (ref 0.0–0.1)
Basophils Relative: 0 %
Eosinophils Absolute: 0.5 10*3/uL (ref 0.0–0.5)
Eosinophils Relative: 10 %
HCT: 31.7 % — ABNORMAL LOW (ref 36.0–46.0)
Hemoglobin: 10.1 g/dL — ABNORMAL LOW (ref 12.0–15.0)
Immature Granulocytes: 0 %
Lymphocytes Relative: 21 %
Lymphs Abs: 1 10*3/uL (ref 0.7–4.0)
MCH: 31.6 pg (ref 26.0–34.0)
MCHC: 31.9 g/dL (ref 30.0–36.0)
MCV: 99.1 fL (ref 80.0–100.0)
Monocytes Absolute: 0.5 10*3/uL (ref 0.1–1.0)
Monocytes Relative: 10 %
Neutro Abs: 2.8 10*3/uL (ref 1.7–7.7)
Neutrophils Relative %: 59 %
Platelets: 209 10*3/uL (ref 150–400)
RBC: 3.2 MIL/uL — ABNORMAL LOW (ref 3.87–5.11)
RDW: 13.3 % (ref 11.5–15.5)
WBC: 4.8 10*3/uL (ref 4.0–10.5)
nRBC: 0 % (ref 0.0–0.2)

## 2019-06-16 LAB — GLUCOSE, CAPILLARY
Glucose-Capillary: 112 mg/dL — ABNORMAL HIGH (ref 70–99)
Glucose-Capillary: 121 mg/dL — ABNORMAL HIGH (ref 70–99)
Glucose-Capillary: 130 mg/dL — ABNORMAL HIGH (ref 70–99)
Glucose-Capillary: 138 mg/dL — ABNORMAL HIGH (ref 70–99)
Glucose-Capillary: 139 mg/dL — ABNORMAL HIGH (ref 70–99)
Glucose-Capillary: 152 mg/dL — ABNORMAL HIGH (ref 70–99)
Glucose-Capillary: 153 mg/dL — ABNORMAL HIGH (ref 70–99)
Glucose-Capillary: 178 mg/dL — ABNORMAL HIGH (ref 70–99)

## 2019-06-16 LAB — BASIC METABOLIC PANEL
Anion gap: 7 (ref 5–15)
BUN: <5 mg/dL — ABNORMAL LOW (ref 8–23)
CO2: 27 mmol/L (ref 22–32)
Calcium: 8.3 mg/dL — ABNORMAL LOW (ref 8.9–10.3)
Chloride: 105 mmol/L (ref 98–111)
Creatinine, Ser: 0.43 mg/dL — ABNORMAL LOW (ref 0.44–1.00)
GFR calc Af Amer: >60 mL/min (ref >60)
GFR calc non Af Amer: >60 mL/min (ref >60)
Glucose, Bld: 132 mg/dL — ABNORMAL HIGH (ref 70–99)
Potassium: 2.4 mmol/L — CL (ref 3.5–5.1)
Sodium: 139 mmol/L (ref 135–145)

## 2019-06-16 LAB — POTASSIUM: Potassium: 3.3 mmol/L — ABNORMAL LOW (ref 3.5–5.1)

## 2019-06-16 LAB — LIPASE, BLOOD: Lipase: 88 U/L — ABNORMAL HIGH (ref 11–51)

## 2019-06-16 MED ORDER — POTASSIUM CHLORIDE 10 MEQ/100ML IV SOLN
10.0000 meq | INTRAVENOUS | Status: AC
  Administered 2019-06-16 (×4): 10 meq via INTRAVENOUS
  Filled 2019-06-16 (×4): qty 100

## 2019-06-16 MED ORDER — POTASSIUM CHLORIDE 10 MEQ/100ML IV SOLN
10.0000 meq | INTRAVENOUS | Status: AC
  Administered 2019-06-16 (×4): 10 meq via INTRAVENOUS
  Filled 2019-06-16 (×5): qty 100

## 2019-06-16 MED ORDER — KETOROLAC TROMETHAMINE 15 MG/ML IJ SOLN
15.0000 mg | Freq: Four times a day (QID) | INTRAMUSCULAR | Status: AC | PRN
  Administered 2019-06-16: 15 mg via INTRAVENOUS
  Filled 2019-06-16: qty 1

## 2019-06-16 MED ORDER — TRAMADOL HCL 50 MG PO TABS: 50.0000 mg | ORAL_TABLET | Freq: Four times a day (QID) | ORAL | Status: DC | PRN

## 2019-06-16 MED ORDER — POTASSIUM CHLORIDE 10 MEQ/100ML IV SOLN: 10.0000 meq | INTRAVENOUS | Status: DC

## 2019-06-16 MED ORDER — HYDROMORPHONE HCL 1 MG/ML IJ SOLN: 1.0000 mg | INTRAMUSCULAR | Status: DC | PRN

## 2019-06-16 MED ORDER — POTASSIUM CHLORIDE CRYS ER 20 MEQ PO TBCR: 40.0000 meq | EXTENDED_RELEASE_TABLET | Freq: Once | ORAL | Status: DC

## 2019-06-16 NOTE — Progress Notes (Signed)
Physical Therapy Treatment Patient Details Name: Julie Rollins MRN: [id number] DOB: 11-Nov-1946 Today's Date: 06/16/2019    History of Present Illness 72 y.o. 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    PT Comments    Pt ambulated in hallway and able to increase distance.  Pt progressing well with mobility.  Recommend nursing staff also ambulate with pt.  Follow Up Recommendations  Home health PT     Equipment Recommendations  None recommended by PT    Recommendations for Other Services       Precautions / Restrictions Precautions Precautions: Fall    Mobility  Bed Mobility Overal bed mobility: Modified Independent                Transfers Overall transfer level: Needs assistance Equipment used: None Transfers: Sit to/from Stand Sit to Stand: Supervision            Ambulation/Gait Ambulation/Gait assistance: Supervision Gait Distance (Feet): 160 Feet Assistive device: IV Pole Gait Pattern/deviations: WFL(Within Functional Limits);Decreased stride length;Narrow base of support Gait velocity: slow   General Gait Details: steady holding IV pole however not really required for support   Stairs             Wheelchair Mobility    Modified Rankin (Stroke Patients Only)       Balance                                            Cognition Arousal/Alertness: Awake/alert Behavior During Therapy: WFL for tasks assessed/performed Overall Cognitive Status: Within Functional Limits for tasks assessed                                        Exercises      General Comments        Pertinent Vitals/Pain Pain Assessment: No/denies pain    Home Living                      Prior Function            PT Goals (current goals  can now be found in the care plan section) Progress towards PT goals: Progressing toward goals    Frequency    Min 3X/week      PT Plan Current plan remains appropriate    Co-evaluation              AM-PAC PT "6 Clicks" Mobility   Outcome Measure  Help needed turning from your back to your side while in a flat bed without using bedrails?: A Little Help needed moving from lying on your back to sitting on the side of a flat bed without using bedrails?: A Little Help needed moving to and from a bed to a chair (including a wheelchair)?: A Little Help needed standing up from a chair using your arms (e.g., wheelchair or bedside chair)?: A Little Help needed to walk in hospital room?: A Little Help needed climbing 3-5 steps with a railing? : A Little 6 Click Score: 18    End of Session Equipment Utilized During Treatment: Gait belt Activity Tolerance: Patient tolerated treatment  well Patient left: with call bell/phone within reach;in bed;with bed alarm set   PT Visit Diagnosis: Other abnormalities of gait and mobility (R26.89)     Time: 1039-1050 PT Time Calculation (min) (ACUTE ONLY): 11 min  Charges:  $Gait Training: 8-22 mins                     Carmelia Bake, PT, DPT Acute Rehabilitation Services Office: [phone] Pager: [phone]  Trena Platt 06/16/2019, 1:06 PM

## 2019-06-16 NOTE — Progress Notes (Signed)
PROGRESS NOTE  JAKALYN KRATKY ZOX:[id number] DOB: 16-Mar-1946 DOA: 06/13/2019 PCP: Nolene Ebbs, MD  Brief History   Julie Rollins Holderis a 73 y.o.femalewith medical history significantforanxiety and depression, history of breast cancer status post lumpectomy and radiation 2000, history of dysrhythmia, hypertension, history of hypothyroidism, recently diagnosed pancreatic cancer who recently underwent an EUS on 06/09/2019 for fiducial markings of the pancreas head mass. 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 Since her procedure she states that she has had significant nausea and vomiting and has inability to keep anything down. Recently was seen in the ED yesterday and sent home with IV fluids and Compazine with no improvement and presented to her oncologist with similar complaints. States that she has not had a proper meal and at least 2 days. Denies any lightheadedness or dizziness but does feel weak. No chest pain, lightheadedness or dizziness. Has not had a bowel movement today she states from not eating properly. Today she was seen by her Gastroenterologist and they referred her as a direct admission [facility]. 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  Medical oncology has been consulted, as has GI and general surgery. In a group text it was determined that the patient required nutritional support before she could undergo stenting of the duodenum. Dr. Barry Dienes was consulted and will place J-tube laparoscopically to allow for enteral nutrition. Eventually the plan will be for Dr. Barry Dienes to perform a whipple.  I also discussed the patient with radiation oncology. The patient will go to [facility] today to get set up for radiation therapy which will start on8/02/2019. This therapy will not be  palliative and is not expected to reduce obstruction. I have explained this to the patient and she has voiced understanding.  UGI yesterday was negative for findings consistent with GOO. An attempt was made to clamp her NGT yesterday, but patietn was unable to tolerate it. Will try again today. So far today the patient is tolerating clears. She will undergo EGD tomorrow to further investigate nausea and vomiting. She may require TPN. Dietician has been consulted.   Consultants   General surgery  [facility]  Gastroenterology  Procedures   None  Antibiotics   Anti-infectives (From admission, onward)   None        Subjective  The patient is lying in bed quietly. She continued to complain of abdominal pain, nausea and vomiting. She is expressing concern over her ability to manage feeding tube at home. No new complaints.  Objective   Vitals:  Vitals:   06/16/19 0416 06/16/19 1157  BP: 133/64 137/71  Pulse: 71 75  Resp: 18   Temp: 98 F (36.7 C) (!) 97.4 F (36.3 C)  SpO2: 96% 98%    Exam:  Constitutional:   The patient is awake, alert, and oriented x 3. No acute distress. Respiratory:   CTA bilaterally, no w/r/r.   Respiratory effort normal. No retractions or accessory muscle use Cardiovascular:   RRR, no m/r/g  No LE extremity edema    Normal pedal pulses Abdomen:   Abdomen is somewhat distended and diffusely tender.   No hernias, masses, or organomegaly is appreciated.  Hypoactive bowel sounds.  Musculoskeletal:   No cyanosis, clubbing, or edema Skin:   No rashes, lesions, ulcers  palpation of skin: no  induration or nodules Neurologic:   CN 2-12 intact  Sensation all 4 extremities intact Psychiatric:   Mental status o Mood, affect appropriate o Orientation to person, place, time   judgment and insight appear intact   I have personally reviewed the following:   Today's Data   CMP, prealbumin, CBC, Iron  studies  Scheduled Meds:  chlorhexidine  15 mL Mouth Rinse BID   diltiazem  180 mg Oral Daily   hydrocortisone   Rectal BID   levothyroxine  25 mcg Intravenous Daily   mouth rinse  15 mL Mouth Rinse q12n4p   metoprolol tartrate  2.5 mg Intravenous Q12H   mirtazapine  7.5 mg Oral QHS   ondansetron  4 mg Intravenous TID   pantoprazole (PROTONIX) IV  40 mg Intravenous Q12H   potassium chloride  40 mEq Oral Once   Continuous Infusions:  dextrose 5 % and 0.9% NaCl 100 mL/hr at 06/16/19 1300   potassium chloride 10 mEq (06/16/19 1543)    Active Problems:   Generalized weakness   Abnormal finding on GI tract imaging   Pancreatic mass   Hypothyroidism   Malignant neoplasm of head of pancreas (HCC)   Personal history of breast cancer   Intractable nausea and vomiting   Partial gastric outlet obstruction   Encounter for nasogastric (NG) tube placement   LOS: 3 days   A & P  Partial gastric outlet obstruction: Upper GI was performed yesterday that was negative for GOO. Thought to be due to pancreatic mass, although imaging does not especially support this. There is a plan to place a duodenal stent, but only after J-tube is placed. Radiation oncology states that their therapy which is planned to begin on 06/28/2019 will not be palliative and is not expected to resolve any obstruction. General surgery will take the patient for laparoscopy and placement of J-tube to be used for enteral nutrition. Consideration is given to possible TPN as well.   Pancreatic Cancer: S/P Folfirinox x 2 cycles. Patient was unable to tolerate this therapy and it was stopped. She will start Radiation therapy (SBRT) on 06/28/2019, but will be set up for it during this inpatient stay. The plan is for whipple after radiation therapy is completed.  Tachycardia: Rate appears controlled on oral cardizem and IV metoprolol. Blood pressures are within acceptable range. Continue IV fluids cautiously.  Hypokalemia:  Supplement and monitor.  2.4 this morning. Supplement and monitor.  Hyperglycemia: Actually hypoglycemic this am. The patient responded to D 50 x 2 and D5 1/2 NS at 100 cc /hr. Monitor.   Severe protein calorie malnutrition: Prealbumin of 7.5. Plan is for general surgery to perform laparoscopy with placement of J-tube to allow for enteral nutrition vs possible TPN.  Anxiety: Continue mirtazapine 7.5 mg PO qhs.  Hypothyroidism: TSH is within normal limits. Continue levothyroxine at 50 mcg, but given IV.  Prolonged QTC: The patient is being monitored on telemetry. Monitor electrolytes. Avoid medications that will prolong QT.  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 Will supplement the patient's iron IV. Feraheme ordered.  SubQ hematoma: At this site of heparin injection. Will stop pharmacological DVT prophylaxis and start SCD's.  I have seen and examined this patient myself. I have spent 45 minutes in her evaluation and care. More than 50% of this was spent in coordination of care with radiation oncology, medical oncology, and general surgery.   DVT prophylaxis: SCD's CODE STATUS: Full Code Family Communication: No  family present Disposition: tbd.  Niajah Sipos, DO [facility] Direct contact: see [email or web address]  7PM-7AM contact night coverage as above 06/16/2019, 4:04 PM  LOS: 2 days

## 2019-06-16 NOTE — Progress Notes (Signed)
[place] NOTE   Pharmacy Consult for TPN Indication: intolerance to enteral feeding  Patient Measurements: Height: 5\' 4"  (162.6 cm) Weight: 116 lb 2.9 oz (52.7 kg) IBW/kg (Calculated) : 54.7 TPN AdjBW (KG): 53.7 Body mass index is 19.94 kg/m. TBW 52.7 kg  HPI:  6 yoF with pancreatic Ca, hx of jaundice, biliary stent. No chemo, plan XRT, Whipple procedure. Noted plan to place J tube.   TPN Access: PAC TPN start date: 7/24  Patient Measurements: Estimated body mass index is 19.94 kg/m as calculated from the following:   Height as of this encounter: 5\' 4"  (1.626 m).   Weight as of this encounter: 116 lb 2.9 oz (52.7 kg).  Estimated Nutritional Needs - Per RD recommendations pending kCal: Protein:  Fluid:  Current Nutrition: CL diet from 7/22  IVF: D5NS at 100 ml/hr  Insulin Requirements:    Recent Labs    06/14/19 0409 06/15/19 0403 06/15/19 0404 06/16/19 0523  NA 142 143  --  139  K 3.5 3.4*  --  2.4*  CL 100 109  --  105  CO2 30 24  --  27  GLUCOSE 96 67*  --  132*  BUN 20 14  --  <5*  CREATININE 0.66 0.51  --  0.43*  CALCIUM 8.4* 8.3*  --  8.3*  PHOS 4.0 3.1  --   --   MG 2.1 2.2  --   --   ALBUMIN 3.0* 2.7*  --   --   ALKPHOS 99 91  --   --   AST 22 22  --   --   ALT 22 22  --   --   BILITOT 0.5 0.8  --   --   PREALBUMIN  --   --  7.5*  --    ASSESSMENT                                                                                                           Glucose - variable  Electrolytes - K low at 2.4, repleting po per CCS, rpt K level this afternoon, CorrCa 9.1, Mag/Phos wnl  Renal - SCr low  LFTs -  wnl  TGs -   Prealbumin -   PLAN                                                                                                                         At 1800 tomorrow 7/24  Start/continue custom TPN  TPN to contain standard  multivitamins daily and trace elements only MWF  Reduce  IVF  Begin/continue CBG, SSI coverage Plan to advance TPN as tolerated to the goal rate. TPN lab panels on Mondays & Thursdays. F/u daily.   Minda Ditto    06/16/2019 12:30 PM

## 2019-06-16 NOTE — Progress Notes (Signed)
CRITICAL VALUE ALERT  Critical Value: Potassium-2.4  Date & Time Notied:  06/16/19  0610  Provider Notified: Schorr K-NP  Orders Received/Actions taken: Order written for potassium

## 2019-06-16 NOTE — Care Management Important Message (Signed)
Important Message  Patient Details IM Letter given to Cookie McGibboney RN to present to the Patient Name: ADYLYNN HERTENSTEIN MRN: [id number] Date of Birth: [birth year]   Medicare Important Message Given:  Yes     Kerin Salen 06/16/2019, 11:03 AM

## 2019-06-16 NOTE — Progress Notes (Signed)
OT Cancellation Note  Patient Details Name: Julie Rollins MRN: [id number] DOB: 08-10-1946   Cancelled Treatment:    Reason Eval/Treat Not Completed: Patient declined, no reason specified--this am. Did not have time to check back later this pm  Rosaline Ezekiel 06/16/2019, 3:08 PM  Lesle Chris, OTR/L Acute Rehabilitation Services [phone] [facility] pager [phone] office 06/16/2019

## 2019-06-16 NOTE — Progress Notes (Addendum)
HEMATOLOGY-ONCOLOGY PROGRESS NOTE  SUBJECTIVE: Developed nausea last evening and NG tube was placed to wall suction again.  Abdominal discomfort and nausea resolved quickly.  She had no vomiting.  Last bowel movement was 2 days ago.  NG tube is clamped this morning.  No nausea or vomiting this morning.  Reports mild left lower quadrant abdominal discomfort.  Taking clear liquids.  Oncology History  Malignant neoplasm of head of pancreas ([place])  03/28/2019 Imaging   CT AP w contrast IMPRESSION: Interval development of 20 x 14 mm ill-defined low density in pancreatic head concerning for possible neoplasm or malignancy. This appears to be resulting in severe intrahepatic and extrahepatic biliary dilatation. ERCP is recommended for further evaluation.  Sigmoid diverticulosis without inflammation.   03/29/2019 Initial Biopsy   Diagnosis FINE NEEDLE ASPIRATION, ENDOSCOPIC EGUS, PANCREATIC HEAD MASS (SPECIMEN 1 OF 2 COLLECTED 03/29/2019) MALIGNANT CELLS CONSISTENT WITH ADENOCARCINOMA.   03/29/2019 Initial Biopsy   Diagnosis Duodenum, Biopsy, abnormal mucosa at major papilla - ADENOCARCINOMA. SEE NOTE Diagnosis Note The duodenal mucosa is negative for dysplasia. The findings are consistent with duodenal involvement from patient's suspected primary pancreatic adenocarcinoma. Dr. Melina Copa has reviewed this case and concurs with the above interpretation. Dr. Ardis Hughs was notified on 03/30/2019.   03/29/2019 Pathology Results   Diagnosis BILE DUCT BRUSHING (SPECIMEN 2 OF 2, COLLECTED ON 03/29/2019): HIGHLY SUSPICIOUS FOR MALIGNANCY.   03/29/2019 Procedure   ERCP with stenting of malignant distal CBD stricture per Dr. Ardis Hughs   04/04/2019 Initial Diagnosis   Malignant neoplasm of head of pancreas ([place])   04/08/2019 Imaging   CT Chest IMPRESSION: No evidence of metastatic disease in the chest. Aortic Atherosclerosis (ICD10-I70.0).    04/13/2019 - 05/10/2019 Chemotherapy   neoadjuvant FOLFIRINOX q3weeks  beginning 04/13/19; dose-reduced with cycle 1   06/08/2019 Genetic Testing   Positive genetic testing. 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 The Common Hereditary Cancers Panel offered by [facility] includes sequencing and/or deletion duplication testing of the following 48 genes: APC, ATM, AXIN2, BARD1, BMPR1A, BRCA1, BRCA2, BRIP1, CDH1, CDKN2A (p14ARF), CDKN2A (p16INK4a), CKD4, CHEK2, CTNNA1, DICER1, EPCAM (Deletion/duplication testing only), GREM1 (promoter region deletion/duplication testing only), KIT, MEN1, MLH1, MSH2, MSH3, MSH6, MUTYH, NBN, NF1, NHTL1, PALB2, PDGFRA, PMS2, POLD1, POLE, PTEN, RAD50, RAD51C, RAD51D, RNF43, SDHB, SDHC, SDHD, SMAD4, SMARCA4. STK11, TP53, TSC1, TSC2, and VHL.  The following genes were evaluated for sequence changes only: SDHA and HOXB13 c.251G>A variant only. The report date is 06/08/2019.      REVIEW OF SYSTEMS:   Constitutional: Denies fevers, chills  Respiratory: Denies cough, dyspnea or wheezes Cardiovascular: Denies palpitation, chest discomfort Gastrointestinal: Reports mild left lower quadrant discomfort.  No nausea or vomiting this morning. Neurological:Denies numbness, tingling or new weaknesses Behavioral/Psych: Mood is stable, no new changes  Extremities: No lower extremity edema All other systems were reviewed with the patient and are negative.  I have reviewed the past medical history, past surgical history, social history and family history with the patient and they are unchanged from previous note.   PHYSICAL EXAMINATION:  Vitals:   06/15/19 2010 06/16/19 0416  BP: 138/68 133/64  Pulse: 85 71  Resp: 18 18  Temp: 97.8 F (36.6 C) 98 F (36.7 C)  SpO2: 96% 96%   Filed Weights   06/14/19 0441 06/15/19 0423 06/16/19 0500  Weight: 114 lb 13.8 oz (52.1 kg) 117 lb 14.4 oz (53.5 kg) 116 lb 2.9 oz (52.7 kg)    Intake/Output from previous day:  07/22 0701 - 07/23  0700 In: 306.8 [I.V.:306.8] Out: 1050 [Urine:600; Emesis/NG output:450]  GENERAL: Thin, chronically ill female.  Alert, no distress and comfortable LYMPH:  no palpable lymphadenopathy in the cervical, axillary or inguinal LUNGS: clear to auscultation and percussion with normal breathing effort HEART: regular rate & rhythm and no murmurs and no lower extremity edema ABDOMEN:abdomen soft, non-tender and normal bowel sounds Musculoskeletal:no cyanosis of digits and no clubbing  NEURO: alert & oriented x 3 with fluent speech, no focal motor/sensory deficits  LABORATORY DATA:  I have reviewed the data as listed CMP Latest Ref Rng & Units 06/16/2019 06/15/2019 06/14/2019  Glucose 70 - 99 mg/dL 132(H) 67(L) 96  BUN 8 - 23 mg/dL <5(L) 14 20  Creatinine 0.44 - 1.00 mg/dL 0.43(L) 0.51 0.66  Sodium 135 - 145 mmol/L 139 143 142  Potassium 3.5 - 5.1 mmol/L 2.4(LL) 3.4(L) 3.5  Chloride 98 - 111 mmol/L 105 109 100  CO2 22 - 32 mmol/L '27 24 30  '$ Calcium 8.9 - 10.3 mg/dL 8.3(L) 8.3(L) 8.4(L)  Total Protein 6.5 - 8.1 g/dL - 5.2(L) 5.4(L)  Total Bilirubin 0.3 - 1.2 mg/dL - 0.8 0.5  Alkaline Phos 38 - 126 U/L - 91 99  AST 15 - 41 U/L - 22 22  ALT 0 - 44 U/L - 22 22    Lab Results  Component Value Date   WBC 4.8 06/16/2019   HGB 10.1 (L) 06/16/2019   HCT 31.7 (L) 06/16/2019   MCV 99.1 06/16/2019   PLT 209 06/16/2019   NEUTROABS 2.8 06/16/2019    Dg Abd 1 View  Result Date: 06/13/2019 CLINICAL DATA:  NG tube placement. EXAM: ABDOMEN - 1 VIEW COMPARISON:  Radiograph yesterday. CT 05/20/2019 FINDINGS: Tip of the enteric tube below the diaphragm in the stomach, side-port in the region of the gastroesophageal junction. Mild gaseous gastric distension. No other bowel dilatation. Biliary stent in the right upper quadrant with pneumobilia. No free air. IMPRESSION: Tip of the enteric tube below the diaphragm in the stomach, side-port in the region of the gastroesophageal junction. Advancement of 3-5 cm would  lead to optimal placement. Electronically Signed   By: Keith Rake M.D.   On: 06/13/2019 19:59   Ct Abdomen Pelvis W Contrast  Result Date: 05/20/2019 CLINICAL DATA:  Ongoing chemotherapy for pancreatic cancer. Stent placement. Loss of appetite. Left lower quadrant abdominal pain. EXAM: CT ABDOMEN AND PELVIS WITH CONTRAST TECHNIQUE: Multidetector CT imaging of the abdomen and pelvis was performed using the standard protocol following bolus administration of intravenous contrast. CONTRAST:  122m OMNIPAQUE IOHEXOL 300 MG/ML  SOLN COMPARISON:  03/28/2019 FINDINGS: Lower chest: Stable scarring in both lung bases. Stable lipoma in the left latissimus dorsi muscle. Hepatobiliary: Diffuse hepatic steatosis. 0.7 by 0.6 cm lesion in the right hepatic lobe on image 44/2 with arterial phase enhancement and delayed phase enhancement, no change from 11/19/2006, compatible with benign process such as hemangioma. Mild wall thickening of the gallbladder noted. A common bile duct expandable stent is present extending into the duodenum. Pneumobilia noted. Pancreas: Minimally dilated dorsal pancreatic duct. Indistinctly marginated hypodensity compatible with infiltrative pancreatic neoplasm measures approximately 1.9 by 1.6 cm on image 48/2. This had a slightly different configuration previously and by my measurement was previously about 1.7 by 1.2 cm. The amount of dorsal pancreatic duct dilatation is mildly increased. No appreciable involvement of regional vasculature Spleen: Unremarkable Adrenals/Urinary Tract: Small cyst of the left kidney lower pole. Hypodense lesion in the right mid kidney  measuring 4 mm in diameter on image 51/5 is technically too small to characterize although statistically likely to be a cyst. Adrenal glands unremarkable. Urinary bladder is empty during imaging. Stomach/Bowel: Sigmoid colon diverticulosis. Vascular/Lymphatic: Conventional proper hepatic artery arising from the celiac trunk. No  appreciable major vascular involvement by the pancreatic tumor. Small porta hepatis and peripancreatic lymph nodes are not pathologically enlarged. Reproductive: Uterus absent.  Adnexa unremarkable. Other: Mild stranding of the mesenteric adipose tissue ("misty mesentery") is present. This can be idiopathic, due to adjacent inflammation such as enteritis, from mild sclerosing mesenteritis, or rarely from other conditions such as cirrhosis, mesenteric lymphoma, or mesenteric venous thrombosis. 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 Musculoskeletal: Degenerative hip arthropathy bilaterally. Sternal deformity from an old fracture. Stable wedge compression fracture at T12. Grade 1 degenerative anterolisthesis at L4-5. IMPRESSION: 1. Mild increase in size of the infiltrative pancreatic head malignancy currently 1.9 by 1.6 cm, previously 1.7 by 1.2 cm by my measurements. No direct major vascular involvement or distant metastatic spread identified. The margin of the tumor is traversed by metallic stent, with resulting pneumobilia. Mildly increased dorsal pancreatic duct dilatation. 2. Diffuse hepatic steatosis. 3. Small hemangioma inferiorly in the right hepatic lobe, stable from 2007. 4. Mild wall thickening of the gallbladder, nonspecific. 5. 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 6. Other imaging findings of potential clinical significance: Sigmoid colon diverticulosis. Degenerative hip arthropathy bilaterally. Old healed sternal fracture. Stable wedge compression fracture at T12. Grade 1 anterolisthesis at L4-5. Electronically Signed   By: Van Clines M.D.   On: 05/20/2019 08:55   Dg Abd 2 Views  Result Date: 06/13/2019 CLINICAL DATA:  Pancreatic cancer. Partial gastric outlet obstruction on EGD. Nausea and vomiting. EXAM: ABDOMEN - 2 VIEW COMPARISON:  May 20, 2019 FINDINGS: The common bile duct stent is stable.  Pneumobilia is seen as expected. No other acute abnormalities. IMPRESSION: Stable common bile duct stent and pneumobilia. No other acute abnormalities. Electronically Signed   By: Dorise Bullion III M.D   On: 06/13/2019 15:07   Dg Duanne Limerick W Single Cm (sol Or Thin Ba)  Result Date: 06/14/2019 CLINICAL DATA:  Gastric outlet obstruction? History of pancreatic cancer. EXAM: UPPER GI SERIES WITHOUT KUB TECHNIQUE: Routine upper GI series was performed with water soluble barium. FLUOROSCOPY TIME:  Fluoroscopy Time:  2 minutes and 1 second 14.4 mGy COMPARISON:  None. FINDINGS: Contrast was administered into the stomach via the indwelling nasogastric tube. Stomach was adequately distended with contrast revealing no mass, ulceration or other wall irregularity. Contrast passed through the normal caliber duodenum without evidence of obstruction or dysmotility. Incidental note made of reflux of contrast into the biliary system, presumably related to the recent stent placement and presumed sphincterotomy. IMPRESSION: 1. No gastric outlet obstruction. 2. No evidence of duodenal obstruction. Electronically Signed   By: Franki Cabot M.D.   On: 06/14/2019 14:13    ASSESSMENT AND PLAN: 1.  Pancreatic adenocarcinoma 2.  Positive BRCA2 mutation 3.  Partial gastric outlet obstruction 4.  Nausea and vomiting 5.  Hypokalemia 6.  Mild anemia  7.  Protein calorie malnutrition  -Currently on supportive care for her pancreatic cancer.  Radiation oncology will proceed with simulation today as planned.  May consider a PARP2 inhibitor for her pancreatic cancer in the future given her limited systemic treatment options. -Upper GI series was negative.  GI following -NG tube was clamped.  Requiring intermittent suction secondary to nausea and  vomiting.  Continue to monitor for recurrent abdominal pain, nausea, vomiting. -Replete potassium per hospitalist. -The patient has mild anemia which is likely dilutional and due to underlying  malignancy.  No evidence of iron deficiency, vitamin B12 deficiency, or folate deficiency. -Patient is being considered for TPN versus feeding tube placement to improve her nutrition.  Dietitian following.   LOS: 3 days   Mikey Bussing, DNP, AGPCNP-BC, AOCNP 06/16/19   Addendum  I have seen the patient, examined her. I agree with the assessment and and plan and have edited the notes.   Elease is clinical stable, although not much improvement. Dr. Barry Dienes has seen her yesterday and may proceed with exp lap and J-tube placement, or TPN.  I will f/u in 1-2 weeks after her hospital discharge.   Truitt Merle  06/16/2019

## 2019-06-16 NOTE — Progress Notes (Signed)
[facility] Progress Note     Subjective: CC-  Patient was nauseated last night, no emesis, and NG tube was hooked back up to LIWS. About 100cc out since then. Wants to try liquids again. Denies any abdominal pain at this time. Feeling ok this morning. Denies n/v. Passing flatus, last BM 2 days ago.  Prealbumin 7.5. Lipase 88  Objective: Vital signs in last 24 hours: Temp:  [97.8 F (36.6 C)-98.2 F (36.8 C)] 98 F (36.7 C) (07/23 0416) Pulse Rate:  [71-85] 71 (07/23 0416) Resp:  [18] 18 (07/23 0416) BP: (133-142)/(64-68) 133/64 (07/23 0416) SpO2:  [96 %-97 %] 96 % (07/23 0416) Weight:  [52.7 kg] 52.7 kg (07/23 0500) Last BM Date: 06/13/19  Intake/Output from previous day: 07/22 0701 - 07/23 0700 In: 306.8 [I.V.:306.8] Out: 1050 [Urine:600; Emesis/NG output:450] Intake/Output this shift: No intake/output data recorded.  PE: Gen:  Alert, NAD, pleasant HEENT: EOM's intact, pupils equal and round Pulm:  Rate and effort normal Abd: soft, nondistended, nontender, +BS, no masses, hernias, or organomegaly Psych: A&Ox3  Skin: no rashes noted, warm and dry  Lab Results:  Recent Labs    06/15/19 0403 06/16/19 0523  WBC 5.6 4.8  HGB 9.8* 10.1*  HCT 31.4* 31.7*  PLT 215 209   BMET Recent Labs    06/15/19 0403 06/16/19 0523  NA 143 139  K 3.4* 2.4*  CL 109 105  CO2 24 27  GLUCOSE 67* 132*  BUN 14 <5*  CREATININE 0.51 0.43*  CALCIUM 8.3* 8.3*   PT/INR Recent Labs    06/15/19 1611  LABPROT 14.3  INR 1.1   CMP     Component Value Date/Time   NA 139 06/16/2019 0523   K 2.4 (LL) 06/16/2019 0523   CL 105 06/16/2019 0523   CO2 27 06/16/2019 0523   GLUCOSE 132 (H) 06/16/2019 0523   BUN <5 (L) 06/16/2019 0523   CREATININE 0.43 (L) 06/16/2019 0523   CREATININE 0.72 06/13/2019 1321   CALCIUM 8.3 (L) 06/16/2019 0523   PROT 5.2 (L) 06/15/2019 0403   ALBUMIN 2.7 (L) 06/15/2019 0403   AST 22 06/15/2019 0403   AST 25 06/13/2019 1321   ALT 22  06/15/2019 0403   ALT 26 06/13/2019 1321   ALKPHOS 91 06/15/2019 0403   BILITOT 0.8 06/15/2019 0403   BILITOT 0.4 06/13/2019 1321   GFRNONAA >60 06/16/2019 0523   GFRNONAA >60 06/13/2019 1321   GFRAA >60 06/16/2019 0523   GFRAA >60 06/13/2019 1321   Lipase     Component Value Date/Time   LIPASE 88 (H) 06/16/2019 0523       Studies/Results: Dg Ugi W Single Cm (sol Or Thin Ba)  Result Date: 06/14/2019 CLINICAL DATA:  Gastric outlet obstruction? History of pancreatic cancer. EXAM: UPPER GI SERIES WITHOUT KUB TECHNIQUE: Routine upper GI series was performed with water soluble barium. FLUOROSCOPY TIME:  Fluoroscopy Time:  2 minutes and 1 second 14.4 mGy COMPARISON:  None. FINDINGS: Contrast was administered into the stomach via the indwelling nasogastric tube. Stomach was adequately distended with contrast revealing no mass, ulceration or other wall irregularity. Contrast passed through the normal caliber duodenum without evidence of obstruction or dysmotility. Incidental note made of reflux of contrast into the biliary system, presumably related to the recent stent placement and presumed sphincterotomy. IMPRESSION: 1. No gastric outlet obstruction. 2. No evidence of duodenal obstruction. Electronically Signed   By: Franki Cabot M.D.   On: 06/14/2019 14:13    Anti-infectives: Anti-infectives (  From admission, onward)   None       Assessment/Plan Hx of Breast Cancer in 2000 with Lumpectomy and Radiation  Hypothyroidism Anxiety Severe malnutrition - prealbumin 7.5 (7/22)  Pancreatic cancer - diagnosed 03/2019, s/p 2 cycles Folfirinox but this was stopped due to poor tolerance. Supposed to start stereotactic radiation but was admitted with n/v. Followed by Dr. [name].  Nausea and vomiting ?Partial Gastric outlet obstruction ?Pancreatitis (from fiducial placement) - lipase 88 today - Needs to improve nutrition: short course TNA and proceed to surgery or J tube with proceeding with  stereotactic radiation.  Dr. Barry Dienes to see and determine plan. Ok to clamp NG tube again today and allow trial of clears.  ID - none VTE - SCDs, sq heparin FEN - IVF, NG tube clamped, CLD Foley - none Follow up - TBD   LOS: 3 days    Wellington Hampshire , [facility] Surgery 06/16/2019, 9:31 AM Pager: [phone] Mon-Thurs 7:00 am-4:30 pm Fri 7:00 am -11:30 AM Sat-Sun 7:00 am-11:30 am

## 2019-06-16 NOTE — H&P (View-Only) (Signed)
Progress Note    ASSESSMENT AND PLAN:   1. 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 Didn't tolerate chemotherapy but to start radiation soon with plans for eventual [place] with Dr. Barry Dienes.    2. Nausea / vomiting, thought possibly due to a partial GOO but UGI series yesterday was negative. Didn't tolerate NGT clamp / trial of clears yesterday but NGT clamped this am and has so far tolerated broth.   -Etiology of ongoing N/V not clear, will proceed with EGD tomorrow. If continues to tolerate clears throughout today then may can hold off on procedure.  The risks and benefits of EGD were discussed and the patient agrees to proceed. -Surgery placing J tube for nutrition  -Continue scheduled IV Zofran    3. Minor rectal bleeding on Sq heparin. Hemorrhoids on exam. Complains of black stool but on DRE stool is LIGHT brown. Hgb is stable.  -anusol bid started yesterday -PPI started yesterday  4. Hypokalemia in setting of NPO and NGT suctioning. -Repletion in progress.  -Repeat labs in am    SUBJECTIVE    Had broth an hour ago with NGT clamped and feels okay so far   OBJECTIVE:     Vital signs in last 24 hours: Temp:  [97.8 F (36.6 C)-98.2 F (36.8 C)] 98 F (36.7 C) (07/23 0416) Pulse Rate:  [71-85] 71 (07/23 0416) Resp:  [18] 18 (07/23 0416) BP: (133-142)/(64-68) 133/64 (07/23 0416) SpO2:  [96 %-97 %] 96 % (07/23 0416) Weight:  [52.7 kg] 52.7 kg (07/23 0500) Last BM Date: 06/13/19 General:   Alert, well-developed female in NAD EENT:  Normal hearing, non icteric sclera, conjunctive pink.  Heart:  Regular rate and rhythm No lower extremity edema   Pulm: Normal respiratory effort, Abdomen:  Soft, nondistended, nontender.  A few bowel sounds,.   Neurologic:  Alert and  oriented x4;  grossly normal neurologically. Psych:  Pleasant, cooperative.  Normal mood and affect.   Intake/Output from  previous day: 07/22 0701 - 07/23 0700 In: 306.8 [I.V.:306.8] Out: 1050 [Urine:600; Emesis/NG output:450] Intake/Output this shift: No intake/output data recorded.  Lab Results: Recent Labs    06/14/19 0409 06/15/19 0403 06/16/19 0523  WBC 7.4 5.6 4.8  HGB 11.0* 9.8* 10.1*  HCT 33.9* 31.4* 31.7*  PLT 253 215 209   BMET Recent Labs    06/14/19 0409 06/15/19 0403 06/16/19 0523  NA 142 143 139  K 3.5 3.4* 2.4*  CL 100 109 105  CO2 30 24 27   GLUCOSE 96 67* 132*  BUN 20 14 <5*  CREATININE 0.66 0.51 0.43*  CALCIUM 8.4* 8.3* 8.3*   LFT Recent Labs    06/15/19 0403  PROT 5.2*  ALBUMIN 2.7*  AST 22  ALT 22  ALKPHOS 91  BILITOT 0.8   PT/INR Recent Labs    06/15/19 1611  LABPROT 14.3  INR 1.1   Hepatitis Panel No results for input(s): HEPBSAG, HCVAB, HEPAIGM, HEPBIGM in the last 72 hours.  Dg Ugi W Single Cm (sol Or Thin Ba)  Result Date: 06/14/2019 CLINICAL DATA:  Gastric outlet obstruction? History of pancreatic cancer. EXAM: UPPER GI SERIES WITHOUT KUB TECHNIQUE: Routine upper GI series was performed with water soluble barium. FLUOROSCOPY TIME:  Fluoroscopy Time:  2 minutes and 1 second 14.4 mGy COMPARISON:  None. FINDINGS: Contrast was administered into the stomach via the indwelling nasogastric tube. Stomach was adequately distended with contrast revealing no mass, ulceration  or other wall irregularity. Contrast passed through the normal caliber duodenum without evidence of obstruction or dysmotility. Incidental note made of reflux of contrast into the biliary system, presumably related to the recent stent placement and presumed sphincterotomy. IMPRESSION: 1. No gastric outlet obstruction. 2. No evidence of duodenal obstruction. Electronically Signed   By: Franki Cabot M.D.   On: 06/14/2019 14:13    Active Problems:   Generalized weakness   Abnormal finding on GI tract imaging   Pancreatic mass   Hypothyroidism   Malignant neoplasm of head of pancreas ([facility])    Personal history of breast cancer   Intractable nausea and vomiting   Partial gastric outlet obstruction   Encounter for nasogastric (NG) tube placement     LOS: 3 days   Tye Savoy ,NP 06/16/2019, 10:28 AM     Attending physician's note   I have taken an interval history, reviewed the chart and examined the patient. I agree with the Advanced Practitioner's note, impression and recommendations.   Persistent nausea and vomiting, not tolerating any oral intake  Will plan for EGD tomorrow AM The risks and benefits as well as alternatives of endoscopic procedure(s) have been discussed and reviewed. All questions answered. The patient agrees to proceed.  Dr Barry Dienes is planning to place J-tube to improve nutritional status  K. Denzil Magnuson , MD [phone]

## 2019-06-16 NOTE — Progress Notes (Addendum)
Progress Note    ASSESSMENT AND PLAN:   1. 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 Didn't tolerate chemotherapy but to start radiation soon with plans for eventual Whipple with Dr. Barry Dienes.    2. Nausea / vomiting, thought possibly due to a partial GOO but UGI series yesterday was negative. Didn't tolerate NGT clamp / trial of clears yesterday but NGT clamped this am and has so far tolerated broth.   -Etiology of ongoing N/V not clear, will proceed with EGD tomorrow. If continues to tolerate clears throughout today then may can hold off on procedure.  The risks and benefits of EGD were discussed and the patient agrees to proceed. -Surgery placing J tube for nutrition  -Continue scheduled IV Zofran    3. Minor rectal bleeding on Sq heparin. Hemorrhoids on exam. Complains of black stool but on DRE stool is LIGHT brown. Hgb is stable.  -anusol bid started yesterday -PPI started yesterday  4. Hypokalemia in setting of NPO and NGT suctioning. -Repletion in progress.  -Repeat labs in am    SUBJECTIVE    Had broth an hour ago with NGT clamped and feels okay so far   OBJECTIVE:     Vital signs in last 24 hours: Temp:  [97.8 F (36.6 C)-98.2 F (36.8 C)] 98 F (36.7 C) (07/23 0416) Pulse Rate:  [71-85] 71 (07/23 0416) Resp:  [18] 18 (07/23 0416) BP: (133-142)/(64-68) 133/64 (07/23 0416) SpO2:  [96 %-97 %] 96 % (07/23 0416) Weight:  [52.7 kg] 52.7 kg (07/23 0500) Last BM Date: 06/13/19 General:   Alert, well-developed female in NAD EENT:  Normal hearing, non icteric sclera, conjunctive pink.  Heart:  Regular rate and rhythm No lower extremity edema   Pulm: Normal respiratory effort, Abdomen:  Soft, nondistended, nontender.  A few bowel sounds,.   Neurologic:  Alert and  oriented x4;  grossly normal neurologically. Psych:  Pleasant, cooperative.  Normal mood and affect.   Intake/Output from  previous day: 07/22 0701 - 07/23 0700 In: 306.8 [I.V.:306.8] Out: 1050 [Urine:600; Emesis/NG output:450] Intake/Output this shift: No intake/output data recorded.  Lab Results: Recent Labs    06/14/19 0409 06/15/19 0403 06/16/19 0523  WBC 7.4 5.6 4.8  HGB 11.0* 9.8* 10.1*  HCT 33.9* 31.4* 31.7*  PLT 253 215 209   BMET Recent Labs    06/14/19 0409 06/15/19 0403 06/16/19 0523  NA 142 143 139  K 3.5 3.4* 2.4*  CL 100 109 105  CO2 30 24 27   GLUCOSE 96 67* 132*  BUN 20 14 <5*  CREATININE 0.66 0.51 0.43*  CALCIUM 8.4* 8.3* 8.3*   LFT Recent Labs    06/15/19 0403  PROT 5.2*  ALBUMIN 2.7*  AST 22  ALT 22  ALKPHOS 91  BILITOT 0.8   PT/INR Recent Labs    06/15/19 1611  LABPROT 14.3  INR 1.1   Hepatitis Panel No results for input(s): HEPBSAG, HCVAB, HEPAIGM, HEPBIGM in the last 72 hours.  Dg Ugi W Single Cm (sol Or Thin Ba)  Result Date: 06/14/2019 CLINICAL DATA:  Gastric outlet obstruction? History of pancreatic cancer. EXAM: UPPER GI SERIES WITHOUT KUB TECHNIQUE: Routine upper GI series was performed with water soluble barium. FLUOROSCOPY TIME:  Fluoroscopy Time:  2 minutes and 1 second 14.4 mGy COMPARISON:  None. FINDINGS: Contrast was administered into the stomach via the indwelling nasogastric tube. Stomach was adequately distended with contrast revealing no mass, ulceration  or other wall irregularity. Contrast passed through the normal caliber duodenum without evidence of obstruction or dysmotility. Incidental note made of reflux of contrast into the biliary system, presumably related to the recent stent placement and presumed sphincterotomy. IMPRESSION: 1. No gastric outlet obstruction. 2. No evidence of duodenal obstruction. Electronically Signed   By: Franki Cabot M.D.   On: 06/14/2019 14:13    Active Problems:   Generalized weakness   Abnormal finding on GI tract imaging   Pancreatic mass   Hypothyroidism   Malignant neoplasm of head of pancreas (HCC)    Personal history of breast cancer   Intractable nausea and vomiting   Partial gastric outlet obstruction   Encounter for nasogastric (NG) tube placement     LOS: 3 days   Tye Savoy ,NP 06/16/2019, 10:28 AM     Attending physician's note   I have taken an interval history, reviewed the chart and examined the patient. I agree with the Advanced Practitioner's note, impression and recommendations.   Persistent nausea and vomiting, not tolerating any oral intake  Will plan for EGD tomorrow AM The risks and benefits as well as alternatives of endoscopic procedure(s) have been discussed and reviewed. All questions answered. The patient agrees to proceed.  Dr Barry Dienes is planning to place J-tube to improve nutritional status  K. Denzil Magnuson , MD [phone]

## 2019-06-16 NOTE — Progress Notes (Signed)
SLP Cancellation Note  Patient Details Name: Julie Rollins MRN: [id number] DOB: 01-Oct-1946   Cancelled treatment:       Reason Eval/Treat Not Completed: Patient declined, no reason specified. Patient pleasantly declined fluids at time of SLP arriving, as she has been getting NG fluids and feels full. Will check next date schedule permitting.   Nadara Mode Tarrell 06/16/2019, 4:11 PM  Sonia Baller, MA, CCC-SLP Speech Therapy [facility]

## 2019-06-16 NOTE — Progress Notes (Signed)
Spoke with pt concerning [facility] needs. [facility] was selected and [facility] for TPN. Both in house rep's are aware.

## 2019-06-17 ENCOUNTER — Encounter (HOSPITAL_COMMUNITY)
Admission: AD | Disposition: A | Discharge status: Skilled Nursing Facility | Payer: Self-pay | Source: Ambulatory Visit | Attending: Internal Medicine

## 2019-06-17 ENCOUNTER — Inpatient Hospital Stay (HOSPITAL_COMMUNITY): Payer: Medicare HMO | Admitting: Registered Nurse

## 2019-06-17 ENCOUNTER — Encounter (HOSPITAL_COMMUNITY): Payer: Self-pay | Admitting: *Deleted

## 2019-06-17 DIAGNOSIS — K315 Obstruction of duodenum: Principal | ICD-10-CM

## 2019-06-17 DIAGNOSIS — R1115 Cyclical vomiting syndrome unrelated to migraine: Secondary | ICD-10-CM

## 2019-06-17 HISTORY — PX: ESOPHAGOGASTRODUODENOSCOPY: SHX5428

## 2019-06-17 LAB — CBC
HCT: 34.4 % — ABNORMAL LOW (ref 36.0–46.0)
Hemoglobin: 10.7 g/dL — ABNORMAL LOW (ref 12.0–15.0)
MCH: 30.7 pg (ref 26.0–34.0)
MCHC: 31.1 g/dL (ref 30.0–36.0)
MCV: 98.9 fL (ref 80.0–100.0)
Platelets: 233 10*3/uL (ref 150–400)
RBC: 3.48 MIL/uL — ABNORMAL LOW (ref 3.87–5.11)
RDW: 13.4 % (ref 11.5–15.5)
WBC: 4.1 10*3/uL (ref 4.0–10.5)
nRBC: 0 % (ref 0.0–0.2)

## 2019-06-17 LAB — TRIGLYCERIDES: Triglycerides: 134 mg/dL (ref <150)

## 2019-06-17 LAB — GLUCOSE, CAPILLARY
Glucose-Capillary: 123 mg/dL — ABNORMAL HIGH (ref 70–99)
Glucose-Capillary: 121 mg/dL — ABNORMAL HIGH (ref 70–99)
Glucose-Capillary: 122 mg/dL — ABNORMAL HIGH (ref 70–99)
Glucose-Capillary: 207 mg/dL — ABNORMAL HIGH (ref 70–99)

## 2019-06-17 LAB — PHOSPHORUS: Phosphorus: 2.8 mg/dL (ref 2.5–4.6)

## 2019-06-17 LAB — COMPREHENSIVE METABOLIC PANEL
ALT: 21 U/L (ref 0–44)
AST: 20 U/L (ref 15–41)
Albumin: 2.6 g/dL — ABNORMAL LOW (ref 3.5–5.0)
Alkaline Phosphatase: 98 U/L (ref 38–126)
Anion gap: 8 (ref 5–15)
BUN: <5 mg/dL — ABNORMAL LOW (ref 8–23)
CO2: 22 mmol/L (ref 22–32)
Calcium: 8.6 mg/dL — ABNORMAL LOW (ref 8.9–10.3)
Chloride: 110 mmol/L (ref 98–111)
Creatinine, Ser: 0.4 mg/dL — ABNORMAL LOW (ref 0.44–1.00)
GFR calc Af Amer: >60 mL/min (ref >60)
GFR calc non Af Amer: >60 mL/min (ref >60)
Glucose, Bld: 133 mg/dL — ABNORMAL HIGH (ref 70–99)
Potassium: 3.2 mmol/L — ABNORMAL LOW (ref 3.5–5.1)
Sodium: 140 mmol/L (ref 135–145)
Total Bilirubin: 0.6 mg/dL (ref 0.3–1.2)
Total Protein: 5.3 g/dL — ABNORMAL LOW (ref 6.5–8.1)

## 2019-06-17 LAB — DIFFERENTIAL
Abs Immature Granulocytes: 0.01 10*3/uL (ref 0.00–0.07)
Basophils Absolute: 0 10*3/uL (ref 0.0–0.1)
Basophils Relative: 1 %
Eosinophils Absolute: 0.3 10*3/uL (ref 0.0–0.5)
Eosinophils Relative: 6 %
Immature Granulocytes: 0 %
Lymphocytes Relative: 21 %
Lymphs Abs: 0.9 10*3/uL (ref 0.7–4.0)
Monocytes Absolute: 0.5 10*3/uL (ref 0.1–1.0)
Monocytes Relative: 12 %
Neutro Abs: 2.4 10*3/uL (ref 1.7–7.7)
Neutrophils Relative %: 60 %

## 2019-06-17 LAB — MAGNESIUM: Magnesium: 1.7 mg/dL (ref 1.7–2.4)

## 2019-06-17 LAB — PREALBUMIN: Prealbumin: 5.9 mg/dL — ABNORMAL LOW (ref 18–38)

## 2019-06-17 SURGERY — EGD (ESOPHAGOGASTRODUODENOSCOPY)
Anesthesia: General

## 2019-06-17 MED ORDER — POTASSIUM CHLORIDE 10 MEQ/100ML IV SOLN: 10.0000 meq | INTRAVENOUS | Status: DC

## 2019-06-17 MED ORDER — INSULIN ASPART 100 UNIT/ML ~~LOC~~ SOLN
0.0000 [IU] | Freq: Four times a day (QID) | SUBCUTANEOUS | Status: DC
  Administered 2019-06-17: 3 [IU] via SUBCUTANEOUS
  Administered 2019-06-18: 2 [IU] via SUBCUTANEOUS
  Administered 2019-06-18: 1 [IU] via SUBCUTANEOUS
  Administered 2019-06-18: 3 [IU] via SUBCUTANEOUS
  Administered 2019-06-18 – 2019-06-19 (×2): 2 [IU] via SUBCUTANEOUS
  Administered 2019-06-19: 1 [IU] via SUBCUTANEOUS
  Administered 2019-06-19: 2 [IU] via SUBCUTANEOUS
  Administered 2019-06-20 (×2): 1 [IU] via SUBCUTANEOUS
  Administered 2019-06-20: 3 [IU] via SUBCUTANEOUS
  Administered 2019-06-20 – 2019-06-21 (×3): 2 [IU] via SUBCUTANEOUS

## 2019-06-17 MED ORDER — DEXAMETHASONE SODIUM PHOSPHATE 10 MG/ML IJ SOLN
INTRAMUSCULAR | Status: DC | PRN
  Administered 2019-06-17: 8 mg via INTRAVENOUS

## 2019-06-17 MED ORDER — SUCCINYLCHOLINE CHLORIDE 200 MG/10ML IV SOSY
PREFILLED_SYRINGE | INTRAVENOUS | Status: DC | PRN
  Administered 2019-06-17: 100 mg via INTRAVENOUS

## 2019-06-17 MED ORDER — POTASSIUM CHLORIDE 10 MEQ/100ML IV SOLN
10.0000 meq | INTRAVENOUS | Status: AC
  Administered 2019-06-17 (×2): 10 meq via INTRAVENOUS
  Filled 2019-06-17 (×2): qty 100

## 2019-06-17 MED ORDER — ONDANSETRON HCL 4 MG/2ML IJ SOLN
INTRAMUSCULAR | Status: DC | PRN
  Administered 2019-06-17: 4 mg via INTRAVENOUS

## 2019-06-17 MED ORDER — POTASSIUM CHLORIDE CRYS ER 20 MEQ PO TBCR: 40.0000 meq | EXTENDED_RELEASE_TABLET | Freq: Two times a day (BID) | ORAL | Status: DC

## 2019-06-17 MED ORDER — PROPOFOL 10 MG/ML IV BOLUS
INTRAVENOUS | Status: AC
  Filled 2019-06-17: qty 20

## 2019-06-17 MED ORDER — TRAVASOL 10 % IV SOLN
INTRAVENOUS | Status: AC
  Administered 2019-06-17: 21:00:00 via INTRAVENOUS
  Filled 2019-06-17: qty 462

## 2019-06-17 MED ORDER — FENTANYL CITRATE (PF) 100 MCG/2ML IJ SOLN
INTRAMUSCULAR | Status: AC
  Filled 2019-06-17: qty 2

## 2019-06-17 MED ORDER — MAGNESIUM SULFATE 2 GM/50ML IV SOLN
2.0000 g | Freq: Once | INTRAVENOUS | Status: AC
  Administered 2019-06-17: 09:00:00 2 g via INTRAVENOUS
  Filled 2019-06-17: qty 50

## 2019-06-17 MED ORDER — LIDOCAINE 2% (20 MG/ML) 5 ML SYRINGE
INTRAMUSCULAR | Status: DC | PRN
  Administered 2019-06-17: 50 mg via INTRAVENOUS

## 2019-06-17 MED ORDER — DEXTROSE-NACL 5-0.9 % IV SOLN
INTRAVENOUS | Status: DC
  Administered 2019-06-17: 18:00:00 via INTRAVENOUS

## 2019-06-17 MED ORDER — METOCLOPRAMIDE HCL 5 MG/ML IJ SOLN
10.0000 mg | Freq: Three times a day (TID) | INTRAMUSCULAR | Status: DC
  Administered 2019-06-17 – 2019-06-21 (×16): 10 mg via INTRAVENOUS
  Filled 2019-06-17 (×16): qty 2

## 2019-06-17 MED ORDER — LACTATED RINGERS IV SOLN
INTRAVENOUS | Status: DC
  Administered 2019-06-17: 09:00:00 via INTRAVENOUS

## 2019-06-17 MED ORDER — POTASSIUM CHLORIDE 10 MEQ/100ML IV SOLN
10.0000 meq | INTRAVENOUS | Status: AC
  Administered 2019-06-17: 10 meq via INTRAVENOUS
  Filled 2019-06-17: qty 100

## 2019-06-17 MED ORDER — ONDANSETRON HCL 4 MG/2ML IJ SOLN: 4.0000 mg | Freq: Four times a day (QID) | INTRAMUSCULAR | Status: DC | PRN

## 2019-06-17 MED ORDER — PROPOFOL 10 MG/ML IV BOLUS
INTRAVENOUS | Status: DC | PRN
  Administered 2019-06-17: 100 mg via INTRAVENOUS

## 2019-06-17 MED ORDER — BOOST / RESOURCE BREEZE PO LIQD CUSTOM
1.0000 | Freq: Two times a day (BID) | ORAL | Status: DC
  Administered 2019-06-17 – 2019-06-23 (×10): 1 via ORAL

## 2019-06-17 MED ORDER — FENTANYL CITRATE (PF) 100 MCG/2ML IJ SOLN
INTRAMUSCULAR | Status: DC | PRN
  Administered 2019-06-17: 25 ug via INTRAVENOUS

## 2019-06-17 NOTE — Evaluation (Signed)
SLP Cancellation Note  Patient Details Name: Julie Rollins MRN: [id number] DOB: 1946/01/07   Cancelled treatment:       Reason Eval/Treat Not Completed: Patient at procedure or test/unavailable(pt having endoscopy, will continue efforts)   Macario Golds 06/17/2019, 10:39 AM  Luanna Salk, [facility] [place] SLP Acute Rehab Services Pager [phone] Office [phone]

## 2019-06-17 NOTE — Progress Notes (Signed)
[facility]ST RN to hang TPN through port. IVF's infusing through port at 60 mls/hr. Pt's IV in RAF burning when flushed; unable to switch fluids to IV and hang TPN via port. Unit RN notified. She will attempt placing an IV and contact [facility] to hang TPN via port.

## 2019-06-17 NOTE — Progress Notes (Signed)
PROGRESS NOTE  [name] KDX:[id number] DOB: 10-06-1946 DOA: 06/13/2019 PCP: Nolene Ebbs, MD  Brief History   Julie Heinrich Holderis a 73 y.o.femalewith medical history significantforanxiety and depression, history of breast cancer status post lumpectomy and radiation 2000, history of dysrhythmia, hypertension, history of hypothyroidism, recently diagnosed pancreatic cancer who recently underwent an EUS on 06/09/2019 for fiducial markings of the pancreas head mass. 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 Since her procedure she states that she has had significant nausea and vomiting and has inability to keep anything down. Recently was seen in the ED yesterday and sent home with IV fluids and Compazine with no improvement and presented to her oncologist with similar complaints. States that she has not had a proper meal and at least 2 days. Denies any lightheadedness or dizziness but does feel weak. No chest pain, lightheadedness or dizziness. Has not had a bowel movement today she states from not eating properly. Today she was seen by her Gastroenterologist and they referred her as a direct admission to [facility] long. 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  Medical oncology has been consulted, as has GI and general surgery. In a group text it was determined that the patient required nutritional support before she could undergo stenting of the duodenum. Dr. Barry Dienes was consulted and will place J-tube laparoscopically to allow for enteral nutrition. Eventually the plan will be for Dr. Barry Dienes to perform a whipple.  I also discussed the patient with radiation oncology. The patient will go to [facility] today to get set up for radiation therapy which will start on8/02/2019. This therapy will not be  palliative and is not expected to reduce obstruction. I have explained this to the patient and she has voiced understanding.  UGI yesterday was negative for findings consistent with GOO. An attempt was made to clamp her NGT yesterday, but patietn was unable to tolerate it. Will try again today. So far today the patient is tolerating clears. 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 Recommendation is for TPN per pharmacy to improve the patient's nutritional status, and planning for laparoscopy and whipple in 5-6 weeks. Dietician has been consulted.  Consultants   General surgery  [facility]  Gastroenterology  Procedures   None  Antibiotics   Anti-infectives (From admission, onward)   None        Subjective  The patient is lying in bed quietly. No new complaints.  Objective   Vitals:  Vitals:   06/17/19 1030 06/17/19 1401  BP: (!) 125/50 119/90  Pulse: 73 83  Resp: (!) 27 18  Temp:  97.8 F (36.6 C)  SpO2: 96% 99%    Exam:  Constitutional:   The patient is awake, alert, and oriented x 3. No acute distress. Respiratory:   CTA bilaterally, no w/r/r.   Respiratory effort normal. No retractions or accessory muscle use Cardiovascular:   RRR, no m/r/g  No LE extremity edema    Normal pedal pulses Abdomen:   Abdomen is somewhat distended and diffusely tender.   No hernias, masses, or organomegaly is appreciated.  Hypoactive bowel sounds.  Musculoskeletal:   No cyanosis, clubbing, or edema Skin:   No rashes, lesions, ulcers  palpation of skin: no induration or nodules Neurologic:  CN 2-12 intact  Sensation all 4 extremities intact Psychiatric:   Mental status o Mood, affect appropriate o Orientation to person, place, time   judgment and insight appear intact   I have personally reviewed the following:   Today's Data   CMP, prealbumin, CBC, Iron  studies  Scheduled Meds:  chlorhexidine  15 mL Mouth Rinse BID   diltiazem  180 mg Oral Daily   feeding supplement  1 Container Oral BID BM   hydrocortisone   Rectal BID   insulin aspart  0-9 Units Subcutaneous Q6H   levothyroxine  25 mcg Intravenous Daily   mouth rinse  15 mL Mouth Rinse q12n4p   metoCLOPramide (REGLAN) injection  10 mg Intravenous TID AC & HS   metoprolol tartrate  2.5 mg Intravenous Q12H   mirtazapine  7.5 mg Oral QHS   pantoprazole (PROTONIX) IV  40 mg Intravenous Q12H   potassium chloride  40 mEq Oral Once   Continuous Infusions:  dextrose 5 % and 0.9% NaCl     TPN ADULT (ION)      Active Problems:   Generalized weakness   Abnormal finding on GI tract imaging   Pancreatic mass   Hypothyroidism   Malignant neoplasm of head of pancreas (HCC)   Personal history of breast cancer   Intractable nausea and vomiting   Partial gastric outlet obstruction   Encounter for nasogastric (NG) tube placement   LOS: 4 days   A & P  Abdominal pain and intractable nausea and vomiting: Upper GI was performed yesterday that was negative for GOO. Thought to be due to pancreatic mass, although imaging does not especially support this. Radiation oncology states that their therapy which is planned to begin on 06/28/2019 will not be palliative and is not expected to resolve any obstruction. General surgery will take the patient for laparoscopy and whipple in 5-6 weeks. The patient will have TPN as managed by pharmacy until then. Dietary has been consulted.  Pancreatic Cancer: S/P Folfirinox x 2 cycles. Patient was unable to tolerate this therapy and it was stopped. She will start Radiation therapy (SBRT) on 06/28/2019, but will be set up for it during this inpatient stay. The plan is for whipple after radiation therapy is completed.  Tachycardia: Rate appears controlled on oral cardizem and IV metoprolol. Blood pressures are within acceptable range. Continue IV fluids  cautiously.  Hypokalemia: Supplement and monitor. 3.2 this morning. Supplement and monitor.  Hyperglycemia: Actually hypoglycemic. The patient responded to D 50 x 2 and D5 NS at 50 cc /hr. Monitor.   Severe protein calorie malnutrition: Prealbumin of 7.5. TPN per pharmacy has been ordered. Dietician has been consulted.  Anxiety: Continue mirtazapine 7.5 mg PO qhs.  Hypothyroidism: TSH is within normal limits. Continue levothyroxine at 50 mcg, but given IV.  Prolonged QTC: The patient is being monitored on telemetry. Monitor electrolytes. Avoid medications that will prolong QT.  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 Will supplement the patient's iron IV. Feraheme ordered.  SubQ hematoma: At this site of heparin injection. Will stop pharmacological DVT prophylaxis and start SCD's.  I have seen and examined this patient myself. I have spent 35 minutes in her evaluation and care.   DVT prophylaxis: SCD's CODE STATUS: Full Code Family Communication: No family present Disposition: tbd.  Fields Oros, DO [facility] Direct contact: see [email or web address]  7PM-7AM contact night coverage as above 06/17/2019, 5:21 PM  LOS: 2 days

## 2019-06-17 NOTE — Anesthesia Postprocedure Evaluation (Signed)
Anesthesia Post Note  Patient: Julie Rollins  Procedure(s) Performed: ESOPHAGOGASTRODUODENOSCOPY (EGD) (N/A )     Patient location during evaluation: PACU Anesthesia Type: General Level of consciousness: awake and alert Pain management: pain level controlled Vital Signs Assessment: post-procedure vital signs reviewed and stable Respiratory status: spontaneous breathing, nonlabored ventilation, respiratory function stable and patient connected to nasal cannula oxygen Cardiovascular status: blood pressure returned to baseline and stable Postop Assessment: no apparent nausea or vomiting Anesthetic complications: no    Last Vitals:  Vitals:   06/17/19 1020 06/17/19 1030  BP: (!) 128/47 (!) 125/50  Pulse: 73 73  Resp: 16 (!) 27  Temp:    SpO2: 95% 96%    Last Pain:  Vitals:   06/17/19 0959  TempSrc: Temporal  PainSc: 0-No pain                 Montez Hageman

## 2019-06-17 NOTE — Progress Notes (Signed)
[facility] Progress Note  Day of Surgery  Subjective: CC-  Just got back from EGD. This showed Acquired duodenal stenosis (suspected to be from tumor/mass effect, biliary stent could be contributing but GI believes less likely). NG tube clamped. Denies any current nausea or recent vomiting. Tolerating few liquids. Passing some flatus. Small BM yesterday.  Objective: Vital signs in last 24 hours: Temp:  [97.4 F (36.3 C)-98.5 F (36.9 C)] 97.4 F (36.3 C) (07/24 0959) Pulse Rate:  [73-88] 73 (07/24 1030) Resp:  [12-27] 27 (07/24 1030) BP: (119-137)/(46-71) 125/50 (07/24 1030) SpO2:  [95 %-100 %] 96 % (07/24 1030) Weight:  [53.2 kg] 53.2 kg (07/24 0901) Last BM Date: 06/13/19(per patient. )  Intake/Output from previous day: 07/23 0701 - 07/24 0700 In: 3470 [P.O.:360; I.V.:2410; IV Piggyback:700] Out: 2250 [Urine:2100; Emesis/NG output:150] Intake/Output this shift: Total I/O In: 710 [I.V.:710] Out: -   PE: Gen:  Alert, NAD, pleasant HEENT: EOM's intact, pupils equal and round Pulm:  Rate and effort normal Abd: soft,nondistended,nontender,+BS, no masses, hernias, or organomegaly Psych: A&Ox3  Skin: no rashes noted, warm and dry  Lab Results:  Recent Labs    06/16/19 0523 06/17/19 0624  WBC 4.8 4.1  HGB 10.1* 10.7*  HCT 31.7* 34.4*  PLT 209 233   BMET Recent Labs    06/16/19 0523 06/16/19 1640 06/17/19 0624  NA 139  --  140  K 2.4* 3.3* 3.2*  CL 105  --  110  CO2 27  --  22  GLUCOSE 132*  --  133*  BUN <5*  --  <5*  CREATININE 0.43*  --  0.40*  CALCIUM 8.3*  --  8.6*   PT/INR Recent Labs    06/15/19 1611  LABPROT 14.3  INR 1.1   CMP     Component Value Date/Time   NA 140 06/17/2019 0624   K 3.2 (L) 06/17/2019 0624   CL 110 06/17/2019 0624   CO2 22 06/17/2019 0624   GLUCOSE 133 (H) 06/17/2019 0624   BUN <5 (L) 06/17/2019 0624   CREATININE 0.40 (L) 06/17/2019 0624   CREATININE 0.72 06/13/2019 1321   CALCIUM 8.6 (L)  06/17/2019 0624   PROT 5.3 (L) 06/17/2019 0624   ALBUMIN 2.6 (L) 06/17/2019 0624   AST 20 06/17/2019 0624   AST 25 06/13/2019 1321   ALT 21 06/17/2019 0624   ALT 26 06/13/2019 1321   ALKPHOS 98 06/17/2019 0624   BILITOT 0.6 06/17/2019 0624   BILITOT 0.4 06/13/2019 1321   GFRNONAA >60 06/17/2019 0624   GFRNONAA >60 06/13/2019 1321   GFRAA >60 06/17/2019 0624   GFRAA >60 06/13/2019 1321   Lipase     Component Value Date/Time   LIPASE 88 (H) 06/16/2019 0523       Studies/Results: [place] Ekg Site Rite  Result Date: 06/16/2019 If Site Rite image not attached, placement could not be confirmed due to current cardiac rhythm.   Anti-infectives: Anti-infectives (From admission, onward)   None       Assessment/Plan Hx of Breast Cancer in 2000 with Lumpectomy and Radiation Hypothyroidism Anxiety Severe malnutrition - prealbumin 7.5 (7/22), starting TPN  Pancreatic cancer- diagnosed 03/2019, s/p 2 cycles Folfirinox but this was stopped due to poor tolerance. Supposed to start stereotactic radiation but was admitted with n/v. Followed by Dr. [name].  Nausea and vomiting ?PartialGastric outlet obstruction ?Pancreatitis (from fiducial placement) -lipase 88 (7/23) - EGD 7/24 showed Acquired duodenal stenosis (suspected to be from tumor/mass effect, biliary stent could  be contributing but GI believes less likely) - Planning short course TNA to improve nutritional status prior to surgery. Keep NG clamped and continue clears as tolerated. Add [employer].   ID -none VTE -SCDs, sq heparin FEN -IVF, NG tube clamped, CLD, Boost breeze Foley -none Follow up -TBD   LOS: 4 days    Wellington Hampshire , [facility] 06/17/2019, 11:21 AM Pager: [phone] Mon-Thurs 7:00 am-4:30 pm Fri 7:00 am -11:30 AM Sat-Sun 7:00 am-11:30 am

## 2019-06-17 NOTE — Anesthesia Procedure Notes (Signed)
Procedure Name: Intubation Date/Time: 06/17/2019 9:37 AM Performed by: Victoriano Lain, CRNA Pre-anesthesia Checklist: Patient identified, Emergency Drugs available, Suction available, Patient being monitored and Timeout performed Patient Re-evaluated:Patient Re-evaluated prior to induction Oxygen Delivery Method: Circle system utilized Preoxygenation: Pre-oxygenation with 100% oxygen Induction Type: Rapid sequence, Cricoid Pressure applied and IV induction Laryngoscope Size: Mac and 3 Grade View: Grade I Tube type: Oral Tube size: 7.5 mm Number of attempts: 1 Airway Equipment and Method: Stylet Placement Confirmation: ETT inserted through vocal cords under direct vision,  positive ETCO2 and breath sounds checked- equal and bilateral Secured at: 20 cm Tube secured with: Tape Dental Injury: Teeth and Oropharynx as per pre-operative assessment  Comments: NGT to LWS prior to induction

## 2019-06-17 NOTE — Op Note (Addendum)
[facility] Patient Name: Julie Rollins Procedure Date: 06/17/2019 MRN: [id number] Attending MD: Gatha Mayer , MD Date of Birth: July 23, 1946 CSN: [id number] Age: 73 Admit Type: Inpatient Procedure:                Upper GI endoscopy Indications:              Persistent vomiting Providers:                Gatha Mayer, MD, Cleda Daub, RN, Cletis Athens, Technician Referring MD:              Medicines:                General Anesthesia Complications:            No immediate complications. Estimated Blood Loss:     Estimated blood loss: none. Procedure:                Pre-Anesthesia Assessment:                           - Prior to the procedure, a History and Physical                            was performed, and patient medications and                            allergies were reviewed. The patient's tolerance of                            previous anesthesia was also reviewed. The risks                            and benefits of the procedure and the sedation                            options and risks were discussed with the patient.                            All questions were answered, and informed consent                            was obtained. Prior Anticoagulants: The patient has                            taken no previous anticoagulant or antiplatelet                            agents. ASA Grade Assessment: III - A patient with                            severe systemic disease. After reviewing the risks  and benefits, the patient was deemed in                            satisfactory condition to undergo the procedure.                           After obtaining informed consent, the endoscope was                            passed under direct vision. Throughout the                            procedure, the patient's blood pressure, pulse, and                            oxygen saturations were monitored  continuously. The                            GIF-H190 ([id number]) Olympus gastroscope was                            introduced through the mouth, and advanced to the                            second part of duodenum. The upper GI endoscopy was                            somewhat difficult due to stenosis. The patient                            tolerated the procedure well. Scope In: Scope Out: Findings:      An acquired severe stenosis was found in the second portion of the       duodenum and was non-traversed.      Slight fibrous food (residue) was found in the gastric fundus.      The exam was otherwise without abnormality. Impression:               - Acquired duodenal stenosis. SUSPECT FROM TUMOR                            AND MASS EFFECT - PARTIAL AND TIGHT - BILIARY STENT                            COULD BE CONTRIBUTING OR CAUSING THIS BY IRRITATING                            DUODENAL WALL BUT AT THIS POINT I DO NOT THINK SO                           - Slight fibrous food (residue) in the stomach.                            LARGELY CLEAR OF FLUID AND DEBRIS                           -  The examination was otherwise normal.                           - No specimens collected. Moderate Sedation:      Not Applicable - Patient had care per Anesthesia. Recommendation:           - Return patient to hospital ward for ongoing care.                           - ADD REGLAN 10 MG IV TID AC/HS AND CONVERT TO PO                            IF ABLE                           MADE ZOFRAN PRN                           CLAMP TUBE                           CLEARS                           DC TUBE IF SHE TOLERATES THAT                           WOULD NOT GO BEYOND FULL LIQUIDS IN HER I THING                           DEFER TO SURGERY RE: NUTRITION - THEY FAVOR TPN                           IF PERSISTENT PROBLEMS COULD CONSIDER TRIMMING                            BILIARY STENT WITH APC BUT I DON'T THINK  THAT IS                            CAUSING HER OBSTRUCTION BUT SUSPECT EDEMA/MASS                            EFFECT MORE LIKELY - HOWEVER I HAVE SEEN A METAL                            STENT CAUSE THIS PROBLEM BEFORE - BUT WITH SO MUCH                            EDEMA AND DIFFICULT TO GET THERE (SAW STENT 1X) MAY                            NOT BE POSSIBLE TO USE APC Procedure Code(s):        --- Professional ---  28638, Esophagogastroduodenoscopy, flexible,                            transoral; diagnostic, including collection of                            specimen(s) by brushing or washing, when performed                            (separate procedure) Diagnosis Code(s):        --- Professional ---                           K31.5, Obstruction of duodenum                           T77.11, Cyclical vomiting syndrome unrelated to                            migraine CPT copyright 2019 American Medical Association. All rights reserved. The codes documented in this report are preliminary and upon coder review may  be revised to meet current compliance requirements. Gatha Mayer, MD 06/17/2019 9:59:40 AM This report has been signed electronically. Number of Addenda: 0

## 2019-06-17 NOTE — Progress Notes (Signed)
OT Cancellation Note  Patient Details Name: Julie Rollins MRN: [id number] DOB: 02/01/1946   Cancelled Treatment:    Reason Eval/Treat Not Completed: Other (comment). Pt states she was at EGD earlier and doesn't feel up to doing anything. Will check back next week.  Quadasia Newsham 06/17/2019, 1:46 PM  Lesle Chris, OTR/L Acute Rehabilitation Services [phone] [facility] pager [phone] office 06/17/2019

## 2019-06-17 NOTE — Progress Notes (Signed)
[place] NOTE   Pharmacy Consult for TPN Indication: intolerance to enteral feeding  Patient Measurements: Height: 5\' 4"  (162.6 cm) Weight: 117 lb 4.6 oz (53.2 kg) IBW/kg (Calculated) : 54.7 TPN AdjBW (KG): 53.7 Body mass index is 20.13 kg/m. TBW 52.7 kg  HPI:  66 yoF with pancreatic Ca, hx of jaundice, biliary stent. No chemo, plan XRT, Whipple procedure. Noted plan to place J tube.   TPN Access: PAC TPN start date: 7/24  Patient Measurements: Estimated body mass index is 20.13 kg/m as calculated from the following:   Height as of this encounter: 5\' 4"  (1.626 m).   Weight as of this encounter: 117 lb 4.6 oz (53.2 kg).  Estimated Nutritional Needs - Per RD recommendations pending kCal: Protein:  Fluid:  Custom  TPN at goal rate 65 ml/hr would deliver 86 gm protein, 1600 kCal Dextrose infusion rate 3mg /kg/min (5mg /kg/min considered upper limit)  Current Nutrition: CL diet from 7/22, NPO for EGD today 7/24  IVF: D5NS at 100 ml/hr  Insulin Requirements: no insulin ordered, but CBGs obtained tid from 7/21, CBG only ordered daily Dexamethasone 8mg  x1 7/24  Recent Labs    06/15/19 0403 06/15/19 0404 06/16/19 0523 06/16/19 1640 06/17/19 0624  NA 143  --  139  --  140  K 3.4*  --  2.4* 3.3* 3.2*  CL 109  --  105  --  110  CO2 24  --  27  --  22  GLUCOSE 67*  --  132*  --  133*  BUN 14  --  <5*  --  <5*  CREATININE 0.51  --  0.43*  --  0.40*  CALCIUM 8.3*  --  8.3*  --  8.6*  PHOS 3.1  --   --   --  2.8  MG 2.2  --   --   --  1.7  ALBUMIN 2.7*  --   --   --  2.6*  ALKPHOS 91  --   --   --  98  AST 22  --   --   --  20  ALT 22  --   --   --  21  BILITOT 0.8  --   --   --  0.6  TRIG  --   --   --   --  134  PREALBUMIN  --  7.5*  --   --  5.9*   ASSESSMENT                                                                                                           Glucose - range 130-153 (7/23)  Electrolytes - K remains  low 3.2 (improved), CorrCa 9.7, Mag/Phos wnl  Renal - SCr low  LFTs -  wnl  TGs - 134  Prealbumin - 5.9   PLAN  This am: KCl runs 10 mEq x4, Mag Sulfate 2gm bolus  At 1800 today  Start custom TPN at 35 ml/hr with standard lytes, MM:HWKG ratio at 1:2  TPN to contain standard multivitamins daily and trace elements only MWF  Reduce IVF NS to 60 ml/hr  CBG q6h, add sensitive SSI coverage, begin at 1800  Plan to advance TPN as tolerated to the goal rate.  TPN lab panels on Mondays & Thursdays.  BMET, Mag and Phos levels in am, high risk of refeeding syndrome  F/u daily.   Minda Ditto PharmD 06/17/2019 8:21 AM

## 2019-06-17 NOTE — Transfer of Care (Signed)
Immediate Anesthesia Transfer of Care Note  Patient: Julie Rollins  Procedure(s) Performed: ESOPHAGOGASTRODUODENOSCOPY (EGD) (N/A )  Patient Location: PACU and Endoscopy Unit  Anesthesia Type:General  Level of Consciousness: awake, alert , oriented and patient cooperative  Airway & Oxygen Therapy: Patient Spontanous Breathing and Patient connected to face mask oxygen  Post-op Assessment: Report given to RN, Post -op Vital signs reviewed and stable and Patient moving all extremities  Post vital signs: Reviewed and stable  Last Vitals:  Vitals Value Taken Time  BP    Temp    Pulse    Resp    SpO2      Last Pain:  Vitals:   06/17/19 0917  TempSrc:   PainSc: 0-No pain      Patients Stated Pain Goal: 2 (01/16/35 1224)  Complications: No apparent anesthesia complications

## 2019-06-17 NOTE — Anesthesia Preprocedure Evaluation (Signed)
Anesthesia Evaluation  Patient identified by MRN, date of birth, ID band Patient awake    Reviewed: Allergy & Precautions, H&P , NPO status , Patient's Chart, lab work & pertinent test results  Airway Mallampati: II   Neck ROM: full    Dental   Pulmonary shortness of breath,    breath sounds clear to auscultation       Cardiovascular hypertension, + dysrhythmias  Rhythm:regular Rate:Normal     Neuro/Psych PSYCHIATRIC DISORDERS Anxiety    GI/Hepatic Pancreatic CA   Endo/Other  Hypothyroidism   Renal/GU      Musculoskeletal   Abdominal   Peds  Hematology   Anesthesia Other Findings   Reproductive/Obstetrics H/o breast CA                             Anesthesia Physical  Anesthesia Plan  ASA: III  Anesthesia Plan: General   Post-op Pain Management:    Induction: Intravenous, Rapid sequence and Cricoid pressure planned  PONV Risk Score and Plan: 3 and Treatment may vary due to age or medical condition  Airway Management Planned: Oral ETT  Additional Equipment:   Intra-op Plan:   Post-operative Plan:   Informed Consent: I have reviewed the patients History and Physical, chart, labs and discussed the procedure including the risks, benefits and alternatives for the proposed anesthesia with the patient or authorized representative who has indicated his/her understanding and acceptance.     Dental advisory given  Plan Discussed with: CRNA, Anesthesiologist and Surgeon  Anesthesia Plan Comments:         Anesthesia Quick Evaluation                                  Anesthesia Evaluation  Patient identified by MRN, date of birth, ID band Patient awake    Reviewed: Allergy & Precautions, H&P , NPO status , Patient's Chart, lab work & pertinent test results  Airway Mallampati: II   Neck ROM: full    Dental   Pulmonary shortness of breath,    breath sounds clear  to auscultation       Cardiovascular hypertension, + dysrhythmias  Rhythm:regular Rate:Normal     Neuro/Psych PSYCHIATRIC DISORDERS Anxiety    GI/Hepatic Pancreatic CA   Endo/Other  Hypothyroidism   Renal/GU      Musculoskeletal   Abdominal   Peds  Hematology   Anesthesia Other Findings   Reproductive/Obstetrics H/o breast CA                             Anesthesia Physical Anesthesia Plan  ASA: III  Anesthesia Plan: MAC   Post-op Pain Management:    Induction: Intravenous  PONV Risk Score and Plan: 2 and Propofol infusion and Treatment may vary due to age or medical condition  Airway Management Planned: Nasal Cannula  Additional Equipment:   Intra-op Plan:   Post-operative Plan:   Informed Consent: I have reviewed the patients History and Physical, chart, labs and discussed the procedure including the risks, benefits and alternatives for the proposed anesthesia with the patient or authorized representative who has indicated his/her understanding and acceptance.       Plan Discussed with: CRNA, Anesthesiologist and Surgeon  Anesthesia Plan Comments:         Anesthesia Quick Evaluation

## 2019-06-17 NOTE — Progress Notes (Signed)
Nutrition Follow-up  INTERVENTION:   Monitor magnesium, potassium, and phosphorus daily for at least 3 days, MD to replete as needed, as pt is at risk for refeeding syndrome given recent poor PO intakes.  TPN per Pharmacy  Continue Boost Breeze po TID, each supplement provides 250 kcal and 9 grams of protein  NUTRITION DIAGNOSIS:   Inadequate oral intake related to inability to eat, acute illness, nausea, vomiting as evidenced by NPO status, per patient/family report.  Ongoing.  GOAL:   Patient will meet greater than or equal to 90% of their needs  Not meeting.  MONITOR:   Diet advancement, Labs, Weight trends, I & O's(TPN)  REASON FOR ASSESSMENT:   Consult New TPN/TNA  ASSESSMENT:   73 y.o. female with medical history significant for anxiety and depression, history of breast cancer s/p lumpectomy and radiation in 2000, history of dysrhythmia, HTN, hypothyroidism, recently diagnosed pancreatic cancer who underwent an EUS on 7/16 for fiducial markings of the pancreas head mass.  It is noted that she had partial GOO.  Since her procedure she has had significant N/V and inability to keep anything down. She was seen in the ED on 7/19 and sent home with IV fluids and Compazine with no improvement and presented to her oncologist on 7/19 with similar complaints. Denied any lightheadedness or dizziness but does feel weak. Today (7/20) she was seen by her gastroenterologist and referred as a direct admission to [facility] TR[facility] was asked to admit for intractable N/V likely in the setting of GOO which is partial; GI consulted and recommended NGT placement.  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 GI recommends Reglan and resuming clears following procedure. NGT is clamped. Surgery has added Boost Breeze. TPN has been ordered to start today. Per Pharmacy, starting at 35 ml/hr (providing 865 kcal and 46g protein).   Noted per chart review, pt expected to eventually have whipple procedure with possible J-tube placement. Will continue to monitor plan of care.  No weight changes noted since admission.  Medications: Remeron tablet daily, IV Zofran TID, D5 infusion, IV KCl Labs reviewed: CBGs: 121-122 Low K Mg/Phos WNL  Diet Order:   Diet Order            Diet NPO time specified  Diet effective now              EDUCATION NEEDS:   No education needs have been identified at this time  Skin:  Skin Assessment: Reviewed RN Assessment  Last BM:  7/24  Height:   Ht Readings from Last 1 Encounters:  06/17/19 5\' 4"  (1.626 m)    Weight:   Wt Readings from Last 1 Encounters:  06/17/19 53.2 kg    Ideal Body Weight:  54.5 kg  BMI:  Body mass index is 20.13 kg/m.  Estimated Nutritional Needs:   Kcal:  1930-2190 kcal  Protein:  105-120 grams  Fluid:  >/= 2.2 L/day  Clayton Bibles, MS, RD, LDN [place] Dietitian Pager: [phone] After Hours Pager: [phone]

## 2019-06-17 NOTE — Interval H&P Note (Signed)
History and Physical Interval Note:  06/17/2019 9:25 AM  Julie Rollins  has presented today for surgery, with the diagnosis of nausea and vomiting.  The various methods of treatment have been discussed with the patient and family. After consideration of risks, benefits and other options for treatment, the patient has consented to  Procedure(s): ESOPHAGOGASTRODUODENOSCOPY (EGD) (N/A) as a surgical intervention.  The patient's history has been reviewed, patient examined, no change in status, stable for surgery.  I have reviewed the patient's chart and labs.  Questions were answered to the patient's satisfaction.     Silvano Rusk

## 2019-06-18 LAB — GLUCOSE, CAPILLARY
Glucose-Capillary: 147 mg/dL — ABNORMAL HIGH (ref 70–99)
Glucose-Capillary: 149 mg/dL — ABNORMAL HIGH (ref 70–99)
Glucose-Capillary: 192 mg/dL — ABNORMAL HIGH (ref 70–99)
Glucose-Capillary: 225 mg/dL — ABNORMAL HIGH (ref 70–99)

## 2019-06-18 LAB — BASIC METABOLIC PANEL
Anion gap: 5 (ref 5–15)
BUN: 7 mg/dL — ABNORMAL LOW (ref 8–23)
CO2: 23 mmol/L (ref 22–32)
Calcium: 9.1 mg/dL (ref 8.9–10.3)
Chloride: 115 mmol/L — ABNORMAL HIGH (ref 98–111)
Creatinine, Ser: 0.4 mg/dL — ABNORMAL LOW (ref 0.44–1.00)
GFR calc Af Amer: >60 mL/min (ref >60)
GFR calc non Af Amer: >60 mL/min (ref >60)
Glucose, Bld: 173 mg/dL — ABNORMAL HIGH (ref 70–99)
Potassium: 3.8 mmol/L (ref 3.5–5.1)
Sodium: 143 mmol/L (ref 135–145)

## 2019-06-18 LAB — MAGNESIUM: Magnesium: 2.2 mg/dL (ref 1.7–2.4)

## 2019-06-18 LAB — PHOSPHORUS: Phosphorus: 2.8 mg/dL (ref 2.5–4.6)

## 2019-06-18 MED ORDER — DEXTROSE-NACL 5-0.9 % IV SOLN: INTRAVENOUS | Status: DC

## 2019-06-18 MED ORDER — TRAVASOL 10 % IV SOLN
INTRAVENOUS | Status: AC
  Administered 2019-06-18: 19:00:00 via INTRAVENOUS
  Filled 2019-06-18: qty 660

## 2019-06-18 NOTE — Progress Notes (Signed)
[place] NOTE   Pharmacy Consult for TPN Indication: intolerance to enteral feeding  Patient Measurements: Height: 5\' 4"  (162.6 cm) Weight: 117 lb (53.1 kg) IBW/kg (Calculated) : 54.7 TPN AdjBW (KG): 53.2 Body mass index is 20.08 kg/m. TBW 52.7 kg  HPI:  39 yoF with pancreatic Ca, hx of jaundice, biliary stent. No chemo, plan XRT, Whipple procedure. Noted plan to place J tube.   TPN Access: PAC TPN start date: 7/24  Patient Measurements: Estimated body mass index is 20.08 kg/m as calculated from the following:   Height as of this encounter: 5\' 4"  (1.626 m).   Weight as of this encounter: 117 lb (53.1 kg).  Estimated Nutritional Needs - Per RD recommendations pending Kcal:  1930-2190 kcal Protein:  105-120 grams Fluid:  >/= 2.2 L/day  Custom TPN at goal rate 80 ml/hr would deliver 105 gm protein, 1600 kCal Dextrose infusion rate 2.3mg /kg/min (recommended 5mg /kg/min)  Current Nutrition: CL diet, Boost Breeze bid between meals  IVF: D5NS at 60 ml/hr  Insulin Requirements: 7 units/24hr  Significant events: 7/24 EGD - duodenal obstruction, elevated CBG x2 (207 & 225), received Dexamethasone 8mg  IV x1 7/25 CBG at 149 this am, Boost supplement x1 7/24   Recent Labs    06/17/19 0624 06/18/19 0507  NA 140 143  K 3.2* 3.8  CL 110 115*  CO2 22 23  GLUCOSE 133* 173*  BUN <5* 7*  CREATININE 0.40* 0.40*  CALCIUM 8.6* 9.1  PHOS 2.8 2.8  MG 1.7 2.2  ALBUMIN 2.6*  --   ALKPHOS 98  --   AST 20  --   ALT 21  --   BILITOT 0.6  --   TRIG 134  --   PREALBUMIN 5.9*  --    ASSESSMENT                                                                                                           Glucose - CBGs range 121 - 225, 2 outlying levels due to Dexamethasone  Electrolytes - K repleted, Na increasing, CorrCa 9.9, Mag/Phos wnl  Renal - SCr low  LFTs -  wnl  TGs - 134  Prealbumin - 5.9   PLAN                                                                                                                          At 1800 today  Increase Custom TPN to 50 ml/hr with standard lytes (exc Na sl reduced), Maximize Acetate  TPN to contain standard multivitamins daily and trace elements only MWF  Reduce IVF NS to 30 ml/hr  CBG q6h, add sensitive SSI coverage, begin at 1800  Plan to advance TPN as tolerated to the goal rate.  TPN lab panels on Mondays & Thursdays.  BMET, Mag and Phos levels in am, high risk of refeeding syndrome  F/u daily.   Minda Ditto PharmD 06/18/2019 7:55 AM

## 2019-06-18 NOTE — Evaluation (Signed)
Clinical/Bedside Swallow Evaluation Patient Details  Name: Julie Rollins MRN: [id number] Date of Birth: [birth year]  Today's Date: 06/18/2019 Time: SLP Start Time (ACUTE ONLY): 0940 SLP Stop Time (ACUTE ONLY): 0100 SLP Time Calculation (min) (ACUTE ONLY): 920 min  Past Medical History:  Past Medical History:  Diagnosis Date  . Anxiety   . Blood transfusion without reported diagnosis   . Breast CA ([place])    s/p lumpectomy and radiation 2000  . Breast cancer ([place])   . Colitis    2007  . Dyspnea   . Dysrhythmia   . Family history of bladder cancer   . Family history of stomach cancer   . Family history of throat cancer   . Hernia, epigastric   . HTN (hypertension)    "mild hypertension", was on BP med years ago but it caused orthostatic hypotension and she has not been medicated since  . Hypothyroid   . Pancreatic cancer ([place]) dx'd 03/2019  . Personal history of breast cancer   . Personal history of radiation therapy 2001  . Poor appetite 04/2019  . Vertigo   . Vertigo    Past Surgical History:  Past Surgical History:  Procedure Laterality Date  . ABDOMINAL HYSTERECTOMY     37yrs ago  . ANTERIOR CERVICAL DECOMP/DISCECTOMY FUSION N/A 08/02/2018   Procedure: ANTERIOR CERVICAL DECOMPRESSION/DISCECTOMY FUSION CERVICAL FIVE- CERVICAL SIX;  Surgeon: Earnie Larsson, MD;  Location: [place];  Service: Neurosurgery;  Laterality: N/A;  ANTERIOR CERVICAL DECOMPRESSION/DISCECTOMY FUSION CERVICAL FIVE- CERVICAL SIX  . BILIARY BRUSHING  03/29/2019   Procedure: BILIARY BRUSHING;  Surgeon: [name], MD;  Location: [facility] ENDOSCOPY;  Service: Endoscopy;;  . BILIARY STENT PLACEMENT  03/29/2019   Procedure: BILIARY STENT PLACEMENT;  Surgeon: [name], MD;  Location: [facility] ENDOSCOPY;  Service: Endoscopy;;  . BIOPSY  03/29/2019   Procedure: BIOPSY;  Surgeon: [name], MD;  Location: [facility] ENDOSCOPY;  Service: Endoscopy;;  . BREAST LUMPECTOMY     2000  . ERCP N/A 03/29/2019   Procedure:  ENDOSCOPIC RETROGRADE CHOLANGIOPANCREATOGRAPHY (ERCP);  Surgeon: [name], MD;  Location: [facility] ENDOSCOPY;  Service: Endoscopy;  Laterality: N/A;  . ESOPHAGOGASTRODUODENOSCOPY (EGD) WITH PROPOFOL N/A 03/29/2019   Procedure: ESOPHAGOGASTRODUODENOSCOPY (EGD) WITH PROPOFOL;  Surgeon: [name], MD;  Location[facility]COPY;  Service: Endoscopy;  Laterality: N/A;  . ESOPHAGOGASTRODUODENOSCOPY (EGD) WITH PROPOFOL N/A 06/09/2019   Procedure: ESOPHAGOGASTRODUODENOSCOPY (EGD) WITH PROPOFOL;  Surgeon: [name], MD;  Location: [facility] ENDOSCOPY;  Service: Endoscopy;  Laterality: N/A;  . EUS N/A 03/29/2019   Procedure: UPPER ENDOSCOPIC ULTRASOUND (EUS) LINEAR;  Surgeon: [name], MD;  Location: [facility] ENDOSCOPY;  Service: Endoscopy;  Laterality: N/A;  . EUS N/A 06/09/2019   Procedure: UPPER ENDOSCOPIC ULTRASOUND (EUS) LINEAR;  Surgeon: [name], MD;  Location: [facility] ENDOSCOPY;  Service: Endoscopy;  Laterality: N/A;  . FIDUCIAL MARKER PLACEMENT  06/09/2019   Procedure: FIDUCIAL MARKER PLACEMENT;  Surgeon: [name], MD;  Location: [facility] ENDOSCOPY;  Service: Endoscopy;;  . FINE NEEDLE ASPIRATION  03/29/2019   Procedure: FINE NEEDLE ASPIRATION (FNA);  Surgeon: [name], MD;  Location: [facility] ENDOSCOPY;  Service: Endoscopy;;  . HERNIA REPAIR     20+ years ago  . PORTACATH PLACEMENT N/A 04/12/2019   Procedure: INSERTION PORT-A-CATH LEFT SUBCLAVIAN;  Surgeon: Stark Klein, MD;  Location: [place];  Service: General;  Laterality: N/A;  . RETINAL DETACHMENT SURGERY    . SPHINCTEROTOMY  03/29/2019   Procedure: SPHINCTEROTOMY;  Surgeon: [name], MD;  Location[facility] ENDOSCOPY;  Service: Endoscopy;;   HPI:  [name], 72y/f, presents to ED with significant nausea and vomitting. PMH  hypothyroidism,  of breath cancer s/p lumpectomy and radiation in 2000, HTN, and cancer of pancreatic head diagnosed in early May after presenting to hospital with jaundice s/p ERCP with  CBD stenting 03/29/19. EGD revealed a partial duodenal obstruction presumably from mass effect from tumor/stent.  Plan to address nutrition with a short course of TNA prior to surgery. NG tube DC's and clear only diet initiated 7/24.    Assessment / Plan / Recommendation Clinical Impression  Patient presents with normal swallow function. Bedside swallow evaluation was limited to liquids only due to diet restrictions of clear liquids only. Pt had no s/sx of aspiration, coughing or change in vocal quality with various quantities. Advancement of diet is recommended only after radiation treatments and surgery. ST to sign off at this time.       Aspiration Risk       Diet Recommendation Thin liquid   Liquid Administration via: Straw;Cup Medication Administration: (liquids, IV)    Other  Recommendations Oral Care Recommendations: Oral care BID   Follow up Recommendations  no follow up                  Prognosis Prognosis for Safe Diet Advancement: Good      Swallow Study   General Date of Onset: 06/15/19 HPI: [name], 72y/f, presents to ED with significant nausea and vomitting. PMH  hypothyroidism,  of breath cancer s/p lumpectomy and radiation in 2000, HTN, and cancer of pancreatic head diagnosed in early May after presenting to hospital with jaundice s/p ERCP with CBD stenting 03/29/19. EGD revealed a partial duodenal obstruction presumably from mass effect from tumor/stent.  Plan to address nutrition with a short course of TNA prior to surgery. NG tube DC's and clear only diet initiated 7/24.  Type of Study: Bedside Swallow Evaluation Previous Swallow Assessment: none Diet Prior to this Study: Thin liquids Temperature Spikes Noted: No Respiratory Status: Room air History of Recent Intubation: No Behavior/Cognition: Alert;Cooperative;Pleasant mood Oral Cavity Assessment: Within Functional Limits Oral Care Completed by SLP: No Oral Cavity - Dentition: Adequate natural  dentition Vision: Functional for self-feeding Self-Feeding Abilities: Able to feed self Patient Positioning: Upright in bed Baseline Vocal Quality: Normal Volitional Cough: Strong Volitional Swallow: Able to elicit    Oral/Motor/Sensory Function Overall Oral Motor/Sensory Function: Within functional limits   Ice Chips Ice chips: Within functional limits Presentation: Spoon   Thin Liquid Thin Liquid: Within functional limits Presentation: Cup;Straw    Nectar Thick Nectar Thick Liquid: Not tested   Honey Thick Honey Thick Liquid: Not tested   Puree Puree: Not tested   Solid     Solid: Not tested     Charlynne Cousins Belissa Kooy, MA, CCC-SLP 06/18/2019 10:05 AM

## 2019-06-18 NOTE — Progress Notes (Signed)
  [facility] Progress Note  1 Day Post-Op  Subjective: CC-  NGT clamped for around 24 hours.  No n/v.    Objective: Vital signs in last 24 hours: Temp:  [97.4 F (36.3 C)-98.7 F (37.1 C)] 98.7 F (37.1 C) (07/25 0546) Pulse Rate:  [71-88] 71 (07/25 0754) Resp:  [12-27] 18 (07/25 0546) BP: (119-137)/(46-90) 124/61 (07/25 0754) SpO2:  [95 %-100 %] 97 % (07/25 0546) Weight:  [53.1 kg-53.2 kg] 53.1 kg (07/25 0549) Last BM Date: 06/13/19(per patient. )  Intake/Output from previous day: 07/24 0701 - 07/25 0700 In: 2045.5 [I.V.:1762.8; IV Piggyback:282.8] Out: 1700 [Urine:1700] Intake/Output this shift: No intake/output data recorded.  PE: Gen:  Alert, NAD, pleasant HEENT: EOM's intact, pupils equal and round Pulm:  Rate and effort normal Abd: soft,nondistended,nontender,no masses, hernias, or organomegaly Psych: A&Ox3  Skin: no rashes noted, warm and dry  Lab Results:  Recent Labs    06/16/19 0523 06/17/19 0624  WBC 4.8 4.1  HGB 10.1* 10.7*  HCT 31.7* 34.4*  PLT 209 233   BMET Recent Labs    06/17/19 0624 06/18/19 0507  NA 140 143  K 3.2* 3.8  CL 110 115*  CO2 22 23  GLUCOSE 133* 173*  BUN <5* 7*  CREATININE 0.40* 0.40*  CALCIUM 8.6* 9.1   PT/INR Recent Labs    06/15/19 1611  LABPROT 14.3  INR 1.1   CMP     Component Value Date/Time   NA 143 06/18/2019 0507   K 3.8 06/18/2019 0507   CL 115 (H) 06/18/2019 0507   CO2 23 06/18/2019 0507   GLUCOSE 173 (H) 06/18/2019 0507   BUN 7 (L) 06/18/2019 0507   CREATININE 0.40 (L) 06/18/2019 0507   CREATININE 0.72 06/13/2019 1321   CALCIUM 9.1 06/18/2019 0507   PROT 5.3 (L) 06/17/2019 0624   ALBUMIN 2.6 (L) 06/17/2019 0624   AST 20 06/17/2019 0624   AST 25 06/13/2019 1321   ALT 21 06/17/2019 0624   ALT 26 06/13/2019 1321   ALKPHOS 98 06/17/2019 0624   BILITOT 0.6 06/17/2019 0624   BILITOT 0.4 06/13/2019 1321   GFRNONAA >60 06/18/2019 0507   GFRNONAA >60 06/13/2019 1321   GFRAA >60  06/18/2019 0507   GFRAA >60 06/13/2019 1321   Lipase     Component Value Date/Time   LIPASE 88 (H) 06/16/2019 0523       Studies/Results: [place] Ekg Site Rite  Result Date: 06/16/2019 If Site Rite image not attached, placement could not be confirmed due to current cardiac rhythm.   Anti-infectives: Anti-infectives (From admission, onward)   None       Assessment/Plan Hx of Breast Cancer in 2000 with Lumpectomy and Radiation Hypothyroidism Anxiety Severe malnutrition - prealbumin 7.5 (7/22), starting TPN  Pancreatic cancer- diagnosed 03/2019, s/p 2 cycles Folfirinox but this was stopped due to poor tolerance. Supposed to start stereotactic radiation but was admitted with n/v. Followed by Dr. [name].  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 D/c ngt, clears only.    ID -none VTE -SCDs, sq heparin FEN -IVF, TNA, CLD, Boost breeze Foley -none Follow up -TBD   LOS: 5 days    Stark Klein , [facility] 06/18/2019, 8:59 AM

## 2019-06-18 NOTE — Progress Notes (Addendum)
PROGRESS NOTE  Julie Rollins JKD:[id number] DOB: [birth year] DOA: 06/13/2019 PCP: Nolene Ebbs, MD  Brief History   Julie Rollins a 73 y.o.femalewith medical history significantforanxiety and depression, history of breast cancer status post lumpectomy and radiation 2000, history of dysrhythmia, hypertension, history of hypothyroidism, recently diagnosed pancreatic cancer who recently underwent an EUS on 06/09/2019 for fiducial markings of the pancreas head mass. 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 Since her procedure she states that she has had significant nausea and vomiting and has inability to keep anything down. Recently was seen in the ED yesterday and sent home with IV fluids and Compazine with no improvement and presented to her oncologist with similar complaints. States that she has not had a proper meal and at least 2 days. Denies any lightheadedness or dizziness but does feel weak. No chest pain, lightheadedness or dizziness. Has not had a bowel movement today she states from not eating properly. Today she was seen by her Gastroenterologist and they referred her as a direct admission to [place] [facility]. 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  Medical oncology has been consulted, as has GI and general surgery. In a group text it was determined that the patient required nutritional support before she could undergo stenting of the duodenum. Dr. Barry Dienes was consulted and will place J-tube laparoscopically to allow for enteral nutrition. Eventually the plan will be for Dr. Barry Dienes to perform a whipple.  I also discussed the patient with radiation oncology. The patient will go to Rad Tx today to get set up for radiation therapy which will start on8/02/2019. This therapy will not be  palliative and is not expected to reduce obstruction. I have explained this to the patient and she has voiced understanding.  UGI yesterday was negative for findings consistent with GOO. An attempt was made to clamp her NGT yesterday, but patietn was unable to tolerate it. Will try again today. So far today the patient is tolerating clears. 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 Recommendation is for TPN per pharmacy to improve the patient's nutritional status, and planning for laparoscopy and whipple in 5-6 weeks. Dietician has been consulted.  Consultants   General surgery  [facility]  Gastroenterology  Procedures   None  Antibiotics   Anti-infectives (From admission, onward)   None        Subjective  The patient is walking about in room with assistance upon my visit. She looks quite a bit brighter than yesterday.  Objective   Vitals:  Vitals:   06/18/19 0754 06/18/19 1332  BP: 124/61 128/62  Pulse: 71 81  Resp:  20  Temp:  98.1 F (36.7 C)  SpO2:  99%    Exam:  Constitutional:   The patient is awake, alert, and oriented x 3. No acute distress. Respiratory:   CTA bilaterally, no w/r/r.   Respiratory effort normal. No retractions or accessory muscle use Cardiovascular:   RRR, no m/r/g  No LE extremity edema    Normal pedal pulses Abdomen:   Abdomen is somewhat less distended and less tender.   No hernias, masses, or organomegaly is appreciated.  Hypoactive bowel sounds.  Musculoskeletal:   No cyanosis, clubbing, or edema Skin:   No rashes, lesions, ulcers  palpation of skin: no induration or nodules Neurologic:   CN 2-12 intact  Sensation all 4 extremities intact Psychiatric:   Mental status o Mood, affect appropriate o Orientation to person, place, time   judgment and insight appear intact   I have personally reviewed the following:    Today's Data   CMP, prealbumin, CBC, Iron studies  Scheduled Meds:  chlorhexidine  15 mL Mouth Rinse BID   diltiazem  180 mg Oral Daily   feeding supplement  1 Container Oral BID BM   hydrocortisone   Rectal BID   insulin aspart  0-9 Units Subcutaneous Q6H   levothyroxine  25 mcg Intravenous Daily   mouth rinse  15 mL Mouth Rinse q12n4p   metoCLOPramide (REGLAN) injection  10 mg Intravenous TID AC & HS   metoprolol tartrate  2.5 mg Intravenous Q12H   mirtazapine  7.5 mg Oral QHS   pantoprazole (PROTONIX) IV  40 mg Intravenous Q12H   potassium chloride  40 mEq Oral Once   Continuous Infusions:  dextrose 5 % and 0.9% NaCl     TPN ADULT (ION) 35 mL/hr at 06/17/19 2054   TPN ADULT (ION)      Active Problems:   Generalized weakness   Abnormal finding on GI tract imaging   Pancreatic mass   Hypothyroidism   Malignant neoplasm of head of pancreas (HCC)   Personal history of breast cancer   Intractable nausea and vomiting   Partial gastric outlet obstruction   Encounter for nasogastric (NG) tube placement   LOS: 5 days   A & P  Abdominal pain and intractable nausea and vomiting: Upper GI was performed yesterday that was negative for GOO. Thought to be due to pancreatic mass, although imaging does not especially support this. Radiation oncology states that their therapy which is planned to begin on 06/28/2019 will not be palliative and is not expected to resolve any obstruction. General surgery will take the patient for laparoscopy and whipple in 5-6 weeks. The patient will have TPN as managed by pharmacy until then. Dietary has been consulted. She states that she is feeling quite a bit better today.  Pancreatic Cancer: S/P Folfirinox x 2 cycles. Patient was unable to tolerate this therapy and it was stopped. She will start Radiation therapy (SBRT) on 06/28/2019, but will be set up for it during this inpatient stay. The plan is for whipple after radiation therapy is  completed.  Tachycardia: Rate appears controlled on oral cardizem and IV metoprolol. Blood pressures are within acceptable range. Continue IV fluids cautiously.  Hypokalemia: Resolved. Continue to monitor.  Hyperglycemia: Actually hypoglycemic. The patient responded to D 50 x 2 and D5 NS at 50 cc /hr. Monitor. D 5 has been discontinued now that she is on TPN. I have discussed with pharmacy.  Severe protein calorie malnutrition: Prealbumin of 7.5. TPN per pharmacy has been ordered. Dietician has been consulted.  Anxiety: Continue mirtazapine 7.5 mg PO qhs.  Hypothyroidism: TSH is within normal limits. Continue levothyroxine at 50 mcg, but given IV.  Prolonged QTC: The patient is being monitored on telemetry. Monitor electrolytes. Avoid medications that will prolong QT.  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 Will supplement the patient's iron IV. Feraheme ordered.  SubQ hematoma: At this site of heparin injection. Will stop pharmacological DVT prophylaxis and start SCD's.  I have seen and examined this patient myself. I have spent 32 minutes in her evaluation and care.  DVT prophylaxis: SCD's CODE STATUS: Full Code Family Communication: No family present Disposition: tbd.  Julie Himebaugh, DO [facility] Direct contact: see [email or web address]  7PM-7AM contact night coverage as above 06/18/2019, 3:55 PM  LOS: 2 days

## 2019-06-18 NOTE — Progress Notes (Signed)
    Progress Note   Subjective  Patient feeling much better today TPN was started last night No further nausea and vomiting and tolerating liquid diet.  Wishing she could advance diet   Objective  Vital signs in last 24 hours: Temp:  [97.8 F (36.6 C)-98.7 F (37.1 C)] 98.1 F (36.7 C) (07/25 1332) Pulse Rate:  [71-88] 81 (07/25 1332) Resp:  [17-20] 20 (07/25 1332) BP: (119-128)/(60-90) 128/62 (07/25 1332) SpO2:  [96 %-99 %] 99 % (07/25 1332) Weight:  [53.1 kg] 53.1 kg (07/25 0549) Last BM Date: 06/13/19(per patient. )  General: Alert, well-developed, in NAD Heart:  Regular rate and rhythm; no murmurs Chest: Clear to ascultation bilaterally Abdomen:  Soft, nontender and nondistended. Normal bowel sounds, without guarding, and without rebound.   Extremities:  Without edema. Neurologic:  Alert and  oriented x4; grossly normal neurologically. Psych:  Alert and cooperative. Normal mood and affect.  Intake/Output from previous day: 07/24 0701 - 07/25 0700 In: 2045.5 [I.V.:1762.8; IV Piggyback:282.8] Out: 1700 [Urine:1700] Intake/Output this shift: Total I/O In: 745.8 [P.O.:480; I.V.:265.8] Out: 150 [Urine:150]  Lab Results: Recent Labs    06/16/19 0523 06/17/19 0624  WBC 4.8 4.1  HGB 10.1* 10.7*  HCT 31.7* 34.4*  PLT 209 233   BMET Recent Labs    06/16/19 0523 06/16/19 1640 06/17/19 0624 06/18/19 0507  NA 139  --  140 143  K 2.4* 3.3* 3.2* 3.8  CL 105  --  110 115*  CO2 27  --  22 23  GLUCOSE 132*  --  133* 173*  BUN <5*  --  <5* 7*  CREATININE 0.43*  --  0.40* 0.40*  CALCIUM 8.3*  --  8.6* 9.1   LFT Recent Labs    06/17/19 0624  PROT 5.3*  ALBUMIN 2.6*  AST 20  ALT 21  ALKPHOS 98  BILITOT 0.6   PT/INR Recent Labs    06/15/19 1611  LABPROT 14.3  INR 1.1      Assessment & Recommendations  73 year old female with history of pancreatic head cancer, more remote breast cancer, here with nausea and vomiting secondary to partial duodenal  narrowing due to tumor  1.  Pancreatic cancer causing partial duodenal narrowing/nausea/vomiting/malnutrition --doing better today.  Tolerating liquid diet.  NG tube is been removed.  No further nausea and vomiting.  TPN started --Continue TPN, Dr. Barry Dienes is following --Likely not to advance past liquid diet for now, eventually advance as tolerated --Ongoing pancreatic cancer treatment will begin radiation in about a week --Antiemetics as needed --We will sign off for now, call with questions      LOS: 5 days   Jerene Bears  06/18/2019, 1:51 PM

## 2019-06-19 ENCOUNTER — Encounter (HOSPITAL_COMMUNITY): Payer: Self-pay | Admitting: Internal Medicine

## 2019-06-19 LAB — NOVEL CORONAVIRUS, NAA (HOSP ORDER, SEND-OUT TO REF LAB; TAT 18-24 HRS): SARS-CoV-2, NAA: NOT DETECTED

## 2019-06-19 LAB — BASIC METABOLIC PANEL
Anion gap: 5 (ref 5–15)
BUN: 12 mg/dL (ref 8–23)
CO2: 25 mmol/L (ref 22–32)
Calcium: 8.6 mg/dL — ABNORMAL LOW (ref 8.9–10.3)
Chloride: 114 mmol/L — ABNORMAL HIGH (ref 98–111)
Creatinine, Ser: 0.41 mg/dL — ABNORMAL LOW (ref 0.44–1.00)
GFR calc Af Amer: >60 mL/min (ref >60)
GFR calc non Af Amer: >60 mL/min (ref >60)
Glucose, Bld: 143 mg/dL — ABNORMAL HIGH (ref 70–99)
Potassium: 3.5 mmol/L (ref 3.5–5.1)
Sodium: 144 mmol/L (ref 135–145)

## 2019-06-19 LAB — GLUCOSE, CAPILLARY
Glucose-Capillary: 132 mg/dL — ABNORMAL HIGH (ref 70–99)
Glucose-Capillary: 155 mg/dL — ABNORMAL HIGH (ref 70–99)
Glucose-Capillary: 161 mg/dL — ABNORMAL HIGH (ref 70–99)
Glucose-Capillary: 188 mg/dL — ABNORMAL HIGH (ref 70–99)
Glucose-Capillary: 99 mg/dL (ref 70–99)

## 2019-06-19 LAB — PHOSPHORUS: Phosphorus: 3 mg/dL (ref 2.5–4.6)

## 2019-06-19 LAB — MAGNESIUM: Magnesium: 2.3 mg/dL (ref 1.7–2.4)

## 2019-06-19 MED ORDER — TRAVASOL 10 % IV SOLN
INTRAVENOUS | Status: AC
  Administered 2019-06-19: 18:00:00 via INTRAVENOUS
  Filled 2019-06-19: qty 792

## 2019-06-19 NOTE — Progress Notes (Signed)
[facility] NOTE   Pharmacy Consult for TPN Indication: intolerance to enteral feeding  Patient Measurements: Height: 5\' 4"  (162.6 cm) Weight: 117 lb 9.6 oz (53.3 kg) IBW/kg (Calculated) : 54.7 TPN AdjBW (KG): 53.2 Body mass index is 20.19 kg/m. TBW 52.7 kg  HPI:  59 yoF with pancreatic Ca, hx of jaundice, biliary stent. No chemo, plan XRT, Whipple procedure. Noted plan to place J tube with surgery  TPN Access: PAC TPN start date: 7/24  Patient Measurements: Estimated body mass index is 20.19 kg/m as calculated from the following:   Height as of this encounter: 5\' 4"  (1.626 m).   Weight as of this encounter: 117 lb 9.6 oz (53.3 kg).  Estimated Nutritional Needs - Per RD recommendations pending Kcal:  1930-2190 kcal Protein:  105-120 grams Fluid:  >/= 2.2 L/day  Custom TPN at goal rate 80 ml/hr would deliver 105 gm protein, 1600 kCal Dextrose infusion rate 2.3mg /kg/min (recommended 5mg /kg/min)  Current Nutrition: CL diet, Boost Breeze bid between meals - each delivers 9 gm protein, 250 kCal  IVF: discontinued  Insulin Requirements: 7 units/24hr  Significant events: 7/24 EGD - duodenal obstruction, elevated CBG x2 (207 & 225), received Dexamethasone 8mg  IV x1 7/25 CBG at 149 this am, Boost supplement x1 7/24, x2 7/25 7/26 Adj lytes in TPN: Na decr further to 40 mEq/L, K incr sl to 55 mEq/L, cont to max Acetate   Recent Labs    06/17/19 0624 06/18/19 0507 06/19/19 0403  NA 140 143 144  K 3.2* 3.8 3.5  CL 110 115* 114*  CO2 22 23 25   GLUCOSE 133* 173* 143*  BUN <5* 7* 12  CREATININE 0.40* 0.40* 0.41*  CALCIUM 8.6* 9.1 8.6*  PHOS 2.8 2.8 3.0  MG 1.7 2.2 2.3  ALBUMIN 2.6*  --   --   ALKPHOS 98  --   --   AST 20  --   --   ALT 21  --   --   BILITOT 0.6  --   --   TRIG 134  --   --   PREALBUMIN 5.9*  --   --    ASSESSMENT                                                                                                            Glucose - CBGs range 192 - 99, CBGs can fluctuate with CL diet  Electrolytes - K repleted, Na increasing, CorrCa 9.9, Mag/Phos wnl  Renal - SCr low  LFTs -  wnl  TGs - 134  Prealbumin - 5.9   PLAN  At 1800 today  Increase Custom TPN to 60 ml/hr, will deliver 80 gm protein & 1200 kCal with Boost Breeze providing 18 gm protein & 500 kCal  TPN to contain standard multivitamins daily and trace elements only MWF  CBG q6h, add sensitive SSI coverage, begin at 1800  Hold on advance TPN as tolerated to the goal rate with toleration of supplement  TPN lab panels on Mondays & Thursdays.  F/u daily.   Minda Ditto PharmD 06/19/2019 8:04 AM

## 2019-06-19 NOTE — Progress Notes (Signed)
  [facility] Progress Note  2 Days Post-Op  Subjective: CC-  Tolerated NGT removal.  Tolerating clears without n/v.    Objective: Vital signs in last 24 hours: Temp:  [97.7 F (36.5 C)-98.1 F (36.7 C)] 97.7 F (36.5 C) (07/26 0550) Pulse Rate:  [67-81] 71 (07/26 0550) Resp:  [16-20] 18 (07/26 0550) BP: (121-139)/(60-64) 139/64 (07/26 0550) SpO2:  [97 %-99 %] 97 % (07/26 0550) Weight:  [53.3 kg] 53.3 kg (07/26 0554) Last BM Date: 06/13/19  Intake/Output from previous day: 07/25 0701 - 07/26 0700 In: 1540.9 [P.O.:720; I.V.:820.9] Out: 1850 [Urine:1850] Intake/Output this shift: No intake/output data recorded.  PE: Gen:  Alert, NAD, pleasant HEENT: EOM's intact, pupils equal and round Pulm:  Rate and effort normal Abd: soft,sl distended,nontender,no masses, hernias, or organomegaly Psych: A&Ox3  Skin: no rashes noted, warm and dry  Lab Results:  Recent Labs    06/17/19 0624  WBC 4.1  HGB 10.7*  HCT 34.4*  PLT 233   BMET Recent Labs    06/18/19 0507 06/19/19 0403  NA 143 144  K 3.8 3.5  CL 115* 114*  CO2 23 25  GLUCOSE 173* 143*  BUN 7* 12  CREATININE 0.40* 0.41*  CALCIUM 9.1 8.6*   PT/INR No results for input(s): LABPROT, INR in the last 72 hours. CMP     Component Value Date/Time   NA 144 06/19/2019 0403   K 3.5 06/19/2019 0403   CL 114 (H) 06/19/2019 0403   CO2 25 06/19/2019 0403   GLUCOSE 143 (H) 06/19/2019 0403   BUN 12 06/19/2019 0403   CREATININE 0.41 (L) 06/19/2019 0403   CREATININE 0.72 06/13/2019 1321   CALCIUM 8.6 (L) 06/19/2019 0403   PROT 5.3 (L) 06/17/2019 0624   ALBUMIN 2.6 (L) 06/17/2019 0624   AST 20 06/17/2019 0624   AST 25 06/13/2019 1321   ALT 21 06/17/2019 0624   ALT 26 06/13/2019 1321   ALKPHOS 98 06/17/2019 0624   BILITOT 0.6 06/17/2019 0624   BILITOT 0.4 06/13/2019 1321   GFRNONAA >60 06/19/2019 0403   GFRNONAA >60 06/13/2019 1321   GFRAA >60 06/19/2019 0403   GFRAA >60 06/13/2019 1321    Lipase     Component Value Date/Time   LIPASE 88 (H) 06/16/2019 0523       Studies/Results: No results found.  Anti-infectives: Anti-infectives (From admission, onward)   None       Assessment/Plan Hx of Breast Cancer in 2000 with Lumpectomy and Radiation Hypothyroidism Anxiety Severe malnutrition - prealbumin 7.5 (7/22), starting TPN  Pancreatic cancer- diagnosed 03/2019, s/p 2 cycles Folfirinox but this was stopped due to poor tolerance. Supposed to start stereotactic radiation but was admitted with n/v. Followed by Dr. [name].  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   Pancreatitis (from fiducial placement) -lipase 88 (7/23).   - Planning short course TNA to improve nutritional status prior to surgery. Clears only. ID -none VTE -SCDs, sq heparin FEN -IVF, TNA, CLD, Boost breeze Foley -none Follow up -3 weeks with me. Call for further questions.      LOS: 6 days    Stark Klein , [facility] 06/19/2019, 8:20 AM

## 2019-06-19 NOTE — Progress Notes (Signed)
PROGRESS NOTE  Julie Rollins SMO:[id number] DOB: 23-Jun-1946 DOA: 06/13/2019 PCP: Nolene Ebbs, MD  Brief History   Julie Heinrich Holderis a 73 y.o.femalewith medical history significantforanxiety and depression, history of breast cancer status post lumpectomy and radiation 2000, history of dysrhythmia, hypertension, history of hypothyroidism, recently diagnosed pancreatic cancer who recently underwent an EUS on 06/09/2019 for fiducial markings of the pancreas head mass. 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 Since her procedure she states that she has had significant nausea and vomiting and has inability to keep anything down. Recently was seen in the ED yesterday and sent home with IV fluids and Compazine with no improvement and presented to her oncologist with similar complaints. States that she has not had a proper meal and at least 2 days. Denies any lightheadedness or dizziness but does feel weak. No chest pain, lightheadedness or dizziness. Has not had a bowel movement today she states from not eating properly. Today she was seen by her Gastroenterologist and they referred her as a direct admission to [facility]. 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  Medical oncology has been consulted, as has GI and general surgery. In a group text it was determined that the patient required nutritional support before she could undergo stenting of the duodenum. Dr. Barry Dienes was consulted and will place J-tube laparoscopically to allow for enteral nutrition. Eventually the plan will be for Dr. Barry Dienes to perform a whipple.  I also discussed the patient with radiation oncology. The patient will go to Rad Tx today to get set up for radiation therapy which will start on8/02/2019. This therapy will not be  palliative and is not expected to reduce obstruction. I have explained this to the patient and she has voiced understanding.  UGI yesterday was negative for findings consistent with GOO. An attempt was made to clamp her NGT yesterday, but patietn was unable to tolerate it. Will try again today. So far today the patient is tolerating clears. 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 Recommendation is for TPN per pharmacy to improve the patient's nutritional status, and planning for laparoscopy and whipple in 5-6 weeks. Dietician has been consulted.  Consultants   General surgery  [facility]  Gastroenterology  Procedures   None  Antibiotics   Anti-infectives (From admission, onward)   None      Subjective  The patient is Sitting up in bed. No new complaints.  Objective   Vitals:  Vitals:   06/18/19 2051 06/19/19 0550  BP: 121/60 139/64  Pulse: 67 71  Resp: 16 18  Temp: 98.1 F (36.7 C) 97.7 F (36.5 C)  SpO2: 97% 97%    Exam:  Constitutional:   The patient is awake, alert, and oriented x 3. No acute distress. Respiratory:   CTA bilaterally, no w/r/r.   Respiratory effort normal. No retractions or accessory muscle use Cardiovascular:   RRR, no m/r/g  No LE extremity edema    Normal pedal pulses Abdomen:   Abdomen is somewhat less distended and less tender.   No hernias, masses, or organomegaly is appreciated.  Hypoactive bowel sounds.  Musculoskeletal:   No cyanosis, clubbing, or edema Skin:   No rashes, lesions, ulcers  palpation of skin: no induration or nodules Neurologic:  CN 2-12 intact  Sensation all 4 extremities intact Psychiatric:   Mental status o Mood, affect appropriate o Orientation to person, place, time   judgment and insight appear intact   I have personally reviewed the following:   Today's Data   CMP, prealbumin, CBC,  Iron studies  Scheduled Meds:  chlorhexidine  15 mL Mouth Rinse BID   diltiazem  180 mg Oral Daily   feeding supplement  1 Container Oral BID BM   hydrocortisone   Rectal BID   insulin aspart  0-9 Units Subcutaneous Q6H   levothyroxine  25 mcg Intravenous Daily   mouth rinse  15 mL Mouth Rinse q12n4p   metoCLOPramide (REGLAN) injection  10 mg Intravenous TID AC & HS   metoprolol tartrate  2.5 mg Intravenous Q12H   mirtazapine  7.5 mg Oral QHS   pantoprazole (PROTONIX) IV  40 mg Intravenous Q12H   potassium chloride  40 mEq Oral Once   Continuous Infusions:  TPN ADULT (ION) 50 mL/hr at 06/18/19 1855   TPN ADULT (ION)      Active Problems:   Generalized weakness   Abnormal finding on GI tract imaging   Pancreatic mass   Hypothyroidism   Malignant neoplasm of head of pancreas (HCC)   Personal history of breast cancer   Intractable nausea and vomiting   Partial gastric outlet obstruction   Encounter for nasogastric (NG) tube placement   LOS: 6 days   A & P  Abdominal pain and intractable nausea and vomiting: Upper GI was performed yesterday that was negative for GOO. Thought to be due to pancreatic mass, although imaging does not especially support this. Radiation oncology states that their therapy which is planned to begin on 06/28/2019 will not be palliative and is not expected to resolve any obstruction. General surgery will take the patient for laparoscopy and whipple in 5-6 weeks. The patient will have TPN as managed by pharmacy until then. Dietary has been consulted. She states that she is feeling quite a bit better today.  Pancreatic Cancer: S/P Folfirinox x 2 cycles. Patient was unable to tolerate this therapy and it was stopped. She will start Radiation therapy (SBRT) on 06/28/2019, but will be set up for it during this inpatient stay. The plan is for whipple after radiation therapy is completed.  Tachycardia: Rate appears controlled on oral cardizem and IV  metoprolol. Blood pressures are within acceptable range. Continue IV fluids cautiously.  Hypokalemia: Resolved. Continue to monitor.  Hyperglycemia: Actually hypoglycemic. The patient responded to D 50 x 2 and D5 NS at 50 cc /hr. Monitor. D 5 has been discontinued now that she is on TPN. I have discussed with pharmacy.  Severe protein calorie malnutrition: Prealbumin of 7.5. TPN per pharmacy has been ordered. Dietician has been consulted.  Anxiety: Continue mirtazapine 7.5 mg PO qhs.  Hypothyroidism: TSH is within normal limits. Continue levothyroxine at 50 mcg, but given IV.  Prolonged QTC: The patient is being monitored on telemetry. Monitor electrolytes. Avoid medications that will prolong QT.  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 Will supplement the patient's iron IV. Feraheme ordered.  SubQ hematoma: At this site of heparin injection. Will stop pharmacological DVT prophylaxis and start SCD's.  I have seen and examined this patient myself. I have spent 30 minutes in her evaluation and care.   DVT prophylaxis: SCD's CODE STATUS: Full Code Family Communication: No family present Disposition: tbd.  Brodee Mauritz, DO [facility] Direct contact: see [email or web address]  7PM-7AM contact night coverage as above 06/19/2019, 12:41 PM  LOS: 2 days

## 2019-06-20 LAB — COMPREHENSIVE METABOLIC PANEL
ALT: 28 U/L (ref 0–44)
AST: 23 U/L (ref 15–41)
Albumin: 2.7 g/dL — ABNORMAL LOW (ref 3.5–5.0)
Alkaline Phosphatase: 89 U/L (ref 38–126)
Anion gap: 8 (ref 5–15)
BUN: 18 mg/dL (ref 8–23)
CO2: 26 mmol/L (ref 22–32)
Calcium: 8.6 mg/dL — ABNORMAL LOW (ref 8.9–10.3)
Chloride: 108 mmol/L (ref 98–111)
Creatinine, Ser: 0.39 mg/dL — ABNORMAL LOW (ref 0.44–1.00)
GFR calc Af Amer: >60 mL/min (ref >60)
GFR calc non Af Amer: >60 mL/min (ref >60)
Glucose, Bld: 137 mg/dL — ABNORMAL HIGH (ref 70–99)
Potassium: 3.7 mmol/L (ref 3.5–5.1)
Sodium: 142 mmol/L (ref 135–145)
Total Bilirubin: 0.2 mg/dL — ABNORMAL LOW (ref 0.3–1.2)
Total Protein: 5.3 g/dL — ABNORMAL LOW (ref 6.5–8.1)

## 2019-06-20 LAB — CBC
HCT: 35.3 % — ABNORMAL LOW (ref 36.0–46.0)
Hemoglobin: 11 g/dL — ABNORMAL LOW (ref 12.0–15.0)
MCH: 30.9 pg (ref 26.0–34.0)
MCHC: 31.2 g/dL (ref 30.0–36.0)
MCV: 99.2 fL (ref 80.0–100.0)
Platelets: 231 10*3/uL (ref 150–400)
RBC: 3.56 MIL/uL — ABNORMAL LOW (ref 3.87–5.11)
RDW: 13.8 % (ref 11.5–15.5)
WBC: 6.2 10*3/uL (ref 4.0–10.5)
nRBC: 0 % (ref 0.0–0.2)

## 2019-06-20 LAB — PHOSPHORUS: Phosphorus: 4.1 mg/dL (ref 2.5–4.6)

## 2019-06-20 LAB — GLUCOSE, CAPILLARY
Glucose-Capillary: 136 mg/dL — ABNORMAL HIGH (ref 70–99)
Glucose-Capillary: 142 mg/dL — ABNORMAL HIGH (ref 70–99)
Glucose-Capillary: 155 mg/dL — ABNORMAL HIGH (ref 70–99)
Glucose-Capillary: 211 mg/dL — ABNORMAL HIGH (ref 70–99)

## 2019-06-20 LAB — DIFFERENTIAL
Abs Immature Granulocytes: 0.02 10*3/uL (ref 0.00–0.07)
Basophils Absolute: 0 10*3/uL (ref 0.0–0.1)
Basophils Relative: 1 %
Eosinophils Absolute: 0.3 10*3/uL (ref 0.0–0.5)
Eosinophils Relative: 5 %
Immature Granulocytes: 0 %
Lymphocytes Relative: 23 %
Lymphs Abs: 1.4 10*3/uL (ref 0.7–4.0)
Monocytes Absolute: 0.7 10*3/uL (ref 0.1–1.0)
Monocytes Relative: 11 %
Neutro Abs: 3.7 10*3/uL (ref 1.7–7.7)
Neutrophils Relative %: 60 %

## 2019-06-20 LAB — MAGNESIUM: Magnesium: 2.1 mg/dL (ref 1.7–2.4)

## 2019-06-20 LAB — TRIGLYCERIDES: Triglycerides: 104 mg/dL (ref <150)

## 2019-06-20 LAB — PREALBUMIN: Prealbumin: 18.4 mg/dL (ref 18–38)

## 2019-06-20 MED ORDER — TRAVASOL 10 % IV SOLN
INTRAVENOUS | Status: AC
  Administered 2019-06-20: 18:00:00 via INTRAVENOUS
  Filled 2019-06-20: qty 1036.8

## 2019-06-20 NOTE — Care Management Important Message (Signed)
Important Message  Patient Details IM Letter given to Kathrin Greathouse RN to present to the Patient Name: Julie Rollins MRN: [id number] Date of Birth: 04/06/1946   Medicare Important Message Given:  Yes     Kerin Salen 06/20/2019, 11:20 AM

## 2019-06-20 NOTE — Progress Notes (Signed)
[facility] NOTE   Pharmacy Consult for TPN Indication: intolerance to enteral feeding  Patient Measurements: Height: '5\' 4"'$  (162.6 cm) Weight: 115 lb 4.8 oz (52.3 kg) IBW/kg (Calculated) : 54.7 TPN AdjBW (KG): 53.2 Body mass index is 19.79 kg/m.   HPI:  85 yoF with pancreatic Ca, hx of jaundice, biliary stent. No chemo, plan XRT, Whipple procedure. Planning short course of TPN prior to surgery to improve nutritional status.   TPN Access: PAC TPN start date: 7/24  NUTRITIONAL GOALS                                                                                             RD recs (7/24):  Kcal:  1930-2190 kcal Protein:  105-120 grams Fluid:  >/= 2.2 L/day  Custom TPN at goal rate of 11m/hr provides: -  103.68 g/day protein (54 g/L) -   42.3 g/day Lipid  (22 g/L) -   211.2 g/day Dextrose (11 %) -   1556 Kcal/day (reformulated 7/27 to decrease dextrose content slightly and increase lipid content)  Current Nutrition: clear liquid diet, Boost Breeze BID between meals - each delivers 9g protein, 250 kCal  IVF: none  Insulin Requirements: 6 units in past 24 hours via sliding scale   Significant events: 7/24 EGD - duodenal obstruction, elevated CBG x 2, received Dexamethasone '8mg'$  IV x1   Recent Labs    06/19/19 0403 06/20/19 0432  NA 144 142  K 3.5 3.7  CL 114* 108  CO2 25 26  GLUCOSE 143* 137*  BUN 12 18  CREATININE 0.41* 0.39*  CALCIUM 8.6* 8.6*  PHOS 3.0 4.1  MG 2.3 2.1  ALBUMIN  --  2.7*  ALKPHOS  --  89  AST  --  23  ALT  --  28  BILITOT  --  0.2*  TRIG  --  104  PREALBUMIN  --  18.4   ASSESSMENT                                                                                                           Glucose - CBGs 132-188 (goal < 180)  Electrolytes - all WNL, K+ 3.7, Phos increasing    Renal - SCr low  LFTs -  AST/ALT, Alk Phos WNL, Tbili low   TGs - 134 (7/24), 104 (7/27)  Prealbumin - 7.5 (7/22), 5.9  (7/24), 18.4 (7/27)  PLAN  At 1800 today  Increase TPN to goal rate of 80 ml/hr. TPN + Boost Breeze will meet 100 % of goal kcal and goal AA.   Electrolytes in TPN: Continue decreased Na at 40 mEq/L, increase K+ to 60 mEq/L (to keep K+ at least 4 with QT prolongation earlier this admission), decrease Phos to 10 mmol/L, other lytes at standard concentrations, Cl:Ac ratio: max acetate.   TPN to contain standard multivitamins daily. 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   Continue sensitive SSI q6h.   TPN lab panels on Mondays & Thursdays.  CMET, Mag, Phos in AM.   F/u daily.    Lindell Spar, PharmD, BCPS Clinical Pharmacist  06/20/2019 8:40 AM

## 2019-06-20 NOTE — Progress Notes (Signed)
Physical Therapy Treatment Patient Details Name: Julie Rollins MRN: [id number] DOB: 1946/05/30 Today's Date: 06/20/2019    History of Present Illness 73 y.o. 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    PT Comments    Patient progressing well with therapy and ambulated increased distance with IV pole for support, supervision, and minimal balance deficits noted. Patent repots mobilizing in hallway independently for activity, RN aware. Based on functional mobility she is safe for discharge home. She will continue to benefit from skilled PT with home health services upon discharge when medically ready. Acute PT will follow to continue to progress.    Follow Up Recommendations  Home health PT     Equipment Recommendations  None recommended by PT    Recommendations for Other Services       Precautions / Restrictions Precautions Precautions: Fall Restrictions Weight Bearing Restrictions: No    Mobility  Bed Mobility Overal bed mobility: Modified Independent        General bed mobility comments: HOB elevated and patient sitting up at start of session, no cues or assistance needed to rotate and sit EOB  Transfers   Equipment used: None Transfers: Sit to/from Stand      General transfer comment: pt performs sit<>stand with safe sequencing and use of UE's  Ambulation/Gait Ambulation/Gait assistance: Supervision Gait Distance (Feet): 200 Feet Assistive device: IV Pole Gait Pattern/deviations: WFL(Within Functional Limits);Decreased stride length;Narrow base of support Gait velocity: slow   General Gait Details: pt able to ambulate with IV pole and no assistance, slight usteadiness with obstacle negotiation in hallway secondary to narrow BOS, improved with cues for wider stance   Stairs              Wheelchair Mobility    Modified Rankin (Stroke Patients Only)       Balance Overall balance assessment: Needs assistance Sitting-balance support: No upper extremity supported;Feet supported Sitting balance-Leahy Scale: Good     Standing balance support: No upper extremity supported;During functional activity Standing balance-Leahy Scale: Good Standing balance comment: pt with good static balance while washing hands at sink and good balance throughout forward giat with slight deviations noted during obstacle negotiation in hallway                            Cognition Arousal/Alertness: Awake/alert Behavior During Therapy: [facility] for tasks assessed/performed Overall Cognitive Status: Within Functional Limits for tasks assessed           Exercises Other Exercises Other Exercises: 5 reps sit to stand from bedside recliner, pt requires 1 UE to push up to stand and remain steady.    General Comments        Pertinent Vitals/Pain Pain Assessment: No/denies pain           PT Goals (current goals can now be found in the care plan section) Acute Rehab PT Goals Patient Stated Goal: pt discussed goal to return home, become more independent, and have energy and strength to dance again PT Goal Formulation: With patient Time For Goal Achievement: 06/21/19 Potential to Achieve Goals: Good Progress towards PT goals: Progressing toward goals    Frequency    Min 3X/week      PT Plan Current plan remains appropriate       AM-PAC PT "6 Clicks"  Mobility   Outcome Measure  Help needed turning from your back to your side while in a flat bed without using bedrails?: A Little Help needed moving from lying on your back to sitting on the side of a flat bed without using bedrails?: A Little Help needed moving to and from a bed to a chair (including a wheelchair)?: A Little Help needed standing up from a chair using your arms (e.g., wheelchair or bedside chair)?: A  Little Help needed to walk in hospital room?: A Little Help needed climbing 3-5 steps with a railing? : A Little 6 Click Score: 18    End of Session Equipment Utilized During Treatment: Gait belt Activity Tolerance: Patient tolerated treatment well Patient left: with call bell/phone within reach;in chair Nurse Communication: Mobility status PT Visit Diagnosis: Other abnormalities of gait and mobility (R26.89)     Time: 1000-1024 PT Time Calculation (min) (ACUTE ONLY): 24 min  Charges:  $Gait Training: 8-22 mins $Therapeutic Exercise: 8-22 mins                     Kipp Brood, PT, DPT, [facility] Physical Therapist with [facility]  06/20/2019 3:17 PM

## 2019-06-20 NOTE — Progress Notes (Signed)
PROGRESS NOTE  Julie Rollins XNT:[id number] DOB: [birth year] DOA: 06/13/2019 PCP: Nolene Ebbs, MD  Brief History   Julie Rollins Holderis a 73 y.o.femalewith medical history significantforanxiety and depression, history of breast cancer status post lumpectomy and radiation 2000, history of dysrhythmia, hypertension, history of hypothyroidism, recently diagnosed pancreatic cancer who recently underwent an EUS on 06/09/2019 for fiducial markings of the pancreas head mass. 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 Since her procedure she states that she has had significant nausea and vomiting and has inability to keep anything down. Recently was seen in the ED yesterday and sent home with IV fluids and Compazine with no improvement and presented to her oncologist with similar complaints. States that she has not had a proper meal and at least 2 days. Denies any lightheadedness or dizziness but does feel weak. No chest pain, lightheadedness or dizziness. Has not had a bowel movement today she states from not eating properly. Today she was seen by her Gastroenterologist and they referred her as a direct admission to [place] long. 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  Medical oncology has been consulted, as has GI and general surgery. In a group text it was determined that the patient required nutritional support before she could undergo stenting of the duodenum. Dr. Barry Dienes was consulted and will place J-tube laparoscopically to allow for enteral nutrition. Eventually the plan will be for Dr. Barry Dienes to perform a whipple.  I also discussed the patient with radiation oncology. The patient will go to Rad [facility] today to get set up for radiation therapy which will start on8/02/2019. This therapy will not be  palliative and is not expected to reduce obstruction. I have explained this to the patient and she has voiced understanding.  UGI yesterday was negative for findings consistent with GOO. An attempt was made to clamp her NGT yesterday, but patietn was unable to tolerate it. Will try again today. So far today the patient is tolerating clears. 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 Recommendation is for TPN per pharmacy to improve the patient's nutritional status, and planning for laparoscopy and whipple in 5-6 weeks. Dietician has been consulted.  Consultants   General surgery  [facility]  Gastroenterology  Procedures   None  Antibiotics   Anti-infectives (From admission, onward)   None      Subjective  The patient is Sitting up in bed. No new complaints.  Objective   Vitals:  Vitals:   06/20/19 0615 06/20/19 1001  BP: (!) 138/59 (!) 128/54  Pulse: 78 90  Resp: 16   Temp: 98 F (36.7 C)   SpO2: 98%     Exam:  Constitutional:   The patient is awake, alert, and oriented x 3. No acute distress. Respiratory:   CTA bilaterally, no w/r/r.   Respiratory effort normal. No retractions or accessory muscle use Cardiovascular:   RRR, no m/r/g  No LE extremity edema    Normal pedal pulses Abdomen:   Abdomen is somewhat less distended and less tender.   No hernias, masses, or organomegaly is appreciated.  Hypoactive bowel sounds.  Musculoskeletal:   No cyanosis, clubbing, or edema Skin:   No rashes, lesions, ulcers  palpation of skin: no induration or nodules Neurologic:  CN 2-12 intact  Sensation all 4 extremities intact Psychiatric:   Mental status o Mood, affect appropriate o Orientation to person, place, time   judgment and insight appear intact   I have personally reviewed the following:   Today's Data   CMP, prealbumin, CBC, Iron  studies  Scheduled Meds:  chlorhexidine  15 mL Mouth Rinse BID   diltiazem  180 mg Oral Daily   feeding supplement  1 Container Oral BID BM   hydrocortisone   Rectal BID   insulin aspart  0-9 Units Subcutaneous Q6H   levothyroxine  25 mcg Intravenous Daily   mouth rinse  15 mL Mouth Rinse q12n4p   metoCLOPramide (REGLAN) injection  10 mg Intravenous TID AC & HS   metoprolol tartrate  2.5 mg Intravenous Q12H   mirtazapine  7.5 mg Oral QHS   pantoprazole (PROTONIX) IV  40 mg Intravenous Q12H   potassium chloride  40 mEq Oral Once   Continuous Infusions:  TPN ADULT (ION) 60 mL/hr at 06/19/19 1828   TPN ADULT (ION)      Active Problems:   Generalized weakness   Abnormal finding on GI tract imaging   Pancreatic mass   Hypothyroidism   Malignant neoplasm of head of pancreas (HCC)   Personal history of breast cancer   Intractable nausea and vomiting   Partial gastric outlet obstruction   Encounter for nasogastric (NG) tube placement   LOS: 7 days   A & P  Abdominal pain and intractable nausea and vomiting: Upper GI was performed yesterday that was negative for GOO. Thought to be due to pancreatic mass, although imaging does not especially support this. Radiation oncology states that their therapy which is planned to begin on 06/28/2019 will not be palliative and is not expected to resolve any obstruction. General surgery will take the patient for laparoscopy and whipple in 5-6 weeks. The patient will have TPN as managed by pharmacy until then. Dietary has been consulted. She states that she is feeling quite a bit better today. I have discussed the patient with case management regarding the transitioning of her TPN to home. I will also discuss this with pharmacy as she will have to have a relatively stable forma for her TPN for this to work.  Pancreatic Cancer: S/P Folfirinox x 2 cycles. Patient was unable to tolerate this therapy and it was stopped. She will start Radiation  therapy (SBRT) on 06/28/2019, but will be set up for it during this inpatient stay. The plan is for whipple after radiation therapy is completed.  Tachycardia: Rate appears controlled on oral cardizem and IV metoprolol. Blood pressures are within acceptable range. Continue IV fluids cautiously.  Hypokalemia: Resolved. Continue to monitor.  Hyperglycemia: Actually hypoglycemic. The patient responded to D 50 x 2 and D5 NS at 50 cc /hr. Monitor. D 5 has been discontinued now that she is on TPN. I have discussed with pharmacy. She has required 6 units of insulin in the last 24 hours. Will talk to pharmacy about adding some of this to her TPN.  Severe protein calorie malnutrition: Prealbumin of 7.5. TPN per pharmacy has been ordered. Dietician has been consulted.  Anxiety: Continue mirtazapine 7.5 mg PO qhs.  Hypothyroidism: TSH is within normal limits. Continue levothyroxine at 50 mcg, but given IV.  Prolonged QTC: The patient is being monitored on telemetry. Monitor electrolytes. Avoid medications that will prolong QT.  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 Will  supplement the patient's iron IV. Feraheme ordered.  SubQ hematoma: At this site of heparin injection. Will stop pharmacological DVT prophylaxis and start SCD's.  I have seen and examined this patient myself. I have spent 32 minutes in her evaluation and care.   DVT prophylaxis: SCD's CODE STATUS: Full Code Family Communication: No family present Disposition: tbd.  Talin Feister, DO [facility] Direct contact: see [email or web address]  7PM-7AM contact night coverage as above 06/20/2019, 2:44 PM  LOS: 2 days

## 2019-06-20 NOTE — Progress Notes (Signed)
Julie Rollins   DOB:[birth year]   QI#:[id number]   XLK#:[id number]  Oncology follow up  Subjective: Patient is tolerating p.o. iron and oral liquid diet well, mild nausea, no vomiting.  Her only complaint is limited diet option, and most contains sugar.  She overall feels better, got out of bed and walked in the hallway earlier today.  Likely going to SNF.   Objective:  Vitals:   06/20/19 1001 06/20/19 1451  BP: (!) 128/54 132/62  Pulse: 90 79  Resp:  16  Temp:  98.9 F (37.2 C)  SpO2:  99%    Body mass index is 19.79 kg/m.  Intake/Output Summary (Last 24 hours) at 06/20/2019 1646 Last data filed at 06/20/2019 1000 Gross per 24 hour  Intake 931.25 ml  Output 1600 ml  Net -668.75 ml     Sclerae unicteric  Oropharynx clear  No peripheral adenopathy  Lungs clear -- no rales or rhonchi  Heart regular rate and rhythm  Abdomen benign    CBG (last 3)  Recent Labs    06/19/19 2350 06/20/19 0610 06/20/19 1220  GLUCAP 188* 142* 211*     Labs:   Urine Studies No results for input(s): UHGB, CRYS in the last 72 hours.  Invalid input(s): UACOL, UAPR, USPG, UPH, UTP, UGL, UKET, UBIL, UNIT, UROB, [place], UEPI, UWBC, [place], [place], [place], [place], [place]  Basic Metabolic Panel: Recent Labs  Lab 06/15/19 0403 06/16/19 0523  06/17/19 0624 06/18/19 0507 06/19/19 0403 06/20/19 0432  NA 143 139  --  140 143 144 142  K 3.4* 2.4*   < > 3.2* 3.8 3.5 3.7  CL 109 105  --  110 115* 114* 108  CO2 24 27  --  '22 23 25 26  '$ GLUCOSE 67* 132*  --  133* 173* 143* 137*  BUN 14 <5*  --  <5* 7* 12 18  CREATININE 0.51 0.43*  --  0.40* 0.40* 0.41* 0.39*  CALCIUM 8.3* 8.3*  --  8.6* 9.1 8.6* 8.6*  MG 2.2  --   --  1.7 2.2 2.3 2.1  PHOS 3.1  --   --  2.8 2.8 3.0 4.1   < > = values in this interval not displayed.   GFR Estimated Creatinine Clearance: 50.9 mL/min (A) (by C-G formula based on SCr of 0.39 mg/dL (L)). Liver Function Tests: Recent Labs  Lab 06/14/19 0409 06/15/19 0403 06/17/19 0624  06/20/19 0432  AST '22 22 20 23  '$ ALT '22 22 21 28  '$ ALKPHOS 99 91 98 89  BILITOT 0.5 0.8 0.6 0.2*  PROT 5.4* 5.2* 5.3* 5.3*  ALBUMIN 3.0* 2.7* 2.6* 2.7*   Recent Labs  Lab 06/13/19 1921 06/16/19 0523  LIPASE 81* 88*   No results for input(s): AMMONIA in the last 168 hours. Coagulation profile Recent Labs  Lab 06/15/19 1611  INR 1.1    CBC: Recent Labs  Lab 06/14/19 0409 06/15/19 0403 06/16/19 0523 06/17/19 0624 06/20/19 0432  WBC 7.4 5.6 4.8 4.1 6.2  NEUTROABS  --  3.4 2.8 2.4 3.7  HGB 11.0* 9.8* 10.1* 10.7* 11.0*  HCT 33.9* 31.4* 31.7* 34.4* 35.3*  MCV 98.5 102.3* 99.1 98.9 99.2  PLT 253 215 209 233 231   Cardiac Enzymes: No results for input(s): CKTOTAL, CKMB, CKMBINDEX, TROPONINI in the last 168 hours. BNP: Invalid input(s): POCBNP CBG: Recent Labs  Lab 06/19/19 1117 06/19/19 1712 06/19/19 2350 06/20/19 0610 06/20/19 1220  GLUCAP 155* 132* 188* 142* 211*   D-Dimer No results for  input(s): DDIMER in the last 72 hours. Hgb A1c No results for input(s): HGBA1C in the last 72 hours. Lipid Profile Recent Labs    06/20/19 0432  TRIG 104   Thyroid function studies No results for input(s): TSH, T4TOTAL, T3FREE, THYROIDAB in the last 72 hours.  Invalid input(s): FREET3 Anemia work up No results for input(s): VITAMINB12, FOLATE, FERRITIN, TIBC, IRON, RETICCTPCT in the last 72 hours. Microbiology Recent Results (from the past 240 hour(s))  SARS Coronavirus 2 (CEPHEID - Performed in [place] hospital lab), Hosp Order     Status: None   Collection Time: 06/13/19  5:38 PM   Specimen: Nasopharyngeal Swab  Result Value Ref Range Status   SARS Coronavirus 2 NEGATIVE NEGATIVE Final    Comment: (NOTE) If result is NEGATIVE SARS-CoV-2 target nucleic acids are NOT DETECTED. The SARS-CoV-2 RNA is generally detectable in upper and lower  respiratory specimens during the acute phase of infection. The lowest  concentration of SARS-CoV-2 viral copies this assay  can detect is 250  copies / mL. A negative result does not preclude SARS-CoV-2 infection  and should not be used as the sole basis for treatment or other  patient management decisions.  A negative result may occur with  improper specimen collection / handling, submission of specimen other  than nasopharyngeal swab, presence of viral mutation(s) within the  areas targeted by this assay, and inadequate number of viral copies  (<250 copies / mL). A negative result must be combined with clinical  observations, patient history, and epidemiological information. If result is POSITIVE SARS-CoV-2 target nucleic acids are DETECTED. The SARS-CoV-2 RNA is generally detectable in upper and lower  respiratory specimens dur ing the acute phase of infection.  Positive  results are indicative of active infection with SARS-CoV-2.  Clinical  correlation with patient history and other diagnostic information is  necessary to determine patient infection status.  Positive results do  not rule out bacterial infection or co-infection with other viruses. If result is PRESUMPTIVE POSTIVE SARS-CoV-2 nucleic acids MAY BE PRESENT.   A presumptive positive result was obtained on the submitted specimen  and confirmed on repeat testing.  While 2019 novel coronavirus  (SARS-CoV-2) nucleic acids may be present in the submitted sample  additional confirmatory testing may be necessary for epidemiological  and / or clinical management purposes  to differentiate between  SARS-CoV-2 and other Sarbecovirus currently known to infect humans.  If clinically indicated additional testing with an alternate test  methodology [phone]) is advised. The SARS-CoV-2 RNA is generally  detectable in upper and lower respiratory sp ecimens during the acute  phase of infection. The expected result is Negative. Fact Sheet for Patients:  StrictlyIdeas.no Fact Sheet for Healthcare  Providers: BankingDealers.co.za This test is not yet approved or cleared by the [place] FDA and has been authorized for detection and/or diagnosis of SARS-CoV-2 by FDA under an Emergency Use Authorization (EUA).  This EUA will remain in effect (meaning this test can be used) for the duration of the COVID-19 declaration under Section 564(b)(1) of the Act, 21 U.S.C. section 360bbb-3(b)(1), unless the authorization is terminated or revoked sooner. Performed at [facility], [place] [address] [place], [place] [address]   Urine culture     Status: None   Collection Time: 06/13/19  5:39 PM   Specimen: Urine, Clean Catch  Result Value Ref Range Status   Specimen Description   Final    URINE, CLEAN CATCH Performed at [facility],  [place] [address] [place], [place] [address]    Special Requests   Final    NONE Performed at [facility], [place] [address], [place], [place] [address]    Culture   Final    NO GROWTH Performed at [facility], [place] [address] [place], [place] [address]    Report Status 06/15/2019 FINAL  Final  Novel Coronavirus, NAA (hospital order; send-out to ref lab)     Status: None   Collection Time: 06/17/19  5:21 AM   Specimen: Nasopharyngeal Swab; Respiratory  Result Value Ref Range Status   SARS-CoV-2, NAA NOT DETECTED NOT DETECTED Final    Comment: (NOTE) This test was developed and its performance characteristics determined by [employer]. This test has not been FDA cleared or approved. This test has been authorized by FDA under an Emergency Use Authorization (EUA). This test is only authorized for the duration of time the declaration that circumstances exist justifying the authorization of the emergency use of in vitro diagnostic tests for detection of SARS-CoV-2 virus and/or diagnosis of COVID-19 infection under section 564(b)(1) of the Act, 21  U.S.C. 394VQW-0(V)(7), unless the authorization is terminated or revoked sooner. When diagnostic testing is negative, the possibility of a false negative result should be considered in the context of a patient's recent exposures and the presence of clinical signs and symptoms consistent with COVID-19. An individual without symptoms of COVID-19 and who is not shedding SARS-CoV-2 virus would expect to have a negative (not detected) result in this assay. Performed  At: [facility] [place], [place] [id number] Rush Farmer MD QQ:[id number]    [place]  Final    Comment: Performed at [place] [address], [place], [place] [address]      Studies:  No results found.  Assessment: 73 y.o.   1.  Pancreatic adenocarcinoma 2.  Positive BRCA2 mutation 3.  Partial gastric outlet obstruction 4.  Nausea and vomiting 5.  Hypokalemia 6.  Mild anemia  7.  Protein calorie malnutrition  Plan:  -continue TPN and liquid diet, she is tolerating well -she is agreeable to go to SNF, which I think is a better place for her to get rehab and TPN. She lives alone at home and has very limited social support  -I encourage her to exercise, and take some oral supplement.  -she is going to start radiation SBRT on 8/4. I discussed oral PARP inhibitor Olaparib with her, she is interested, will likely start her after radiation.  -I will schedule her f/u with me in my clinic in about 2 weeks.   Truitt Merle, MD 06/20/2019  4:46 PM

## 2019-06-20 NOTE — TOC Progression Note (Signed)
Transition of Care [facility]) - Progression Note    Patient Details  Name: Julie Rollins MRN: [id number] Date of Birth: 1946/06/02  Transition of Care [facility]) CM/SW [place], [place] Phone Number: 06/20/2019, 12:15 PM  Clinical Narrative:    Discharge Plan of Care: Patient will discharge home with TPN. CSW reached out to [employer] infusion Rep. Pam to confirm TPN and supplies and initial instruction for home. Pam states the patient lives alone and does not have any family therefore suggest the patient have aggressive teachings to manage TPN prior to discharging. CSW reached out to the physician.    Barriers to Discharge: No Barriers Identified  Expected Discharge Plan and Services  Home w/ TPN                                   HH Arranged: TPN HH Agency: [facility], [place] Date [facility] Agency Contacted: 06/20/19 Time [place]: 1214 Representative spoke with at [place]: Jeannene Patella and Georgina Snell   Social Determinants of Health ([place]) Interventions    Readmission Risk Interventions No flowsheet data found.

## 2019-06-21 ENCOUNTER — Ambulatory Visit
Admission: RE | Admit: 2019-06-21 | Discharge: 2019-06-21 | Disposition: A | Discharge status: Home / Self Care | Payer: Medicare HMO | Source: Ambulatory Visit | Attending: Radiation Oncology | Admitting: Radiation Oncology

## 2019-06-21 ENCOUNTER — Inpatient Hospital Stay: Payer: Medicare HMO

## 2019-06-21 ENCOUNTER — Telehealth: Payer: Self-pay | Admitting: Pharmacist

## 2019-06-21 DIAGNOSIS — C250 Malignant neoplasm of head of pancreas: Secondary | ICD-10-CM

## 2019-06-21 LAB — COMPREHENSIVE METABOLIC PANEL
ALT: 28 U/L (ref 0–44)
AST: 21 U/L (ref 15–41)
Albumin: 2.8 g/dL — ABNORMAL LOW (ref 3.5–5.0)
Alkaline Phosphatase: 90 U/L (ref 38–126)
Anion gap: 5 (ref 5–15)
BUN: 23 mg/dL (ref 8–23)
CO2: 27 mmol/L (ref 22–32)
Calcium: 8.8 mg/dL — ABNORMAL LOW (ref 8.9–10.3)
Chloride: 107 mmol/L (ref 98–111)
Creatinine, Ser: 0.44 mg/dL (ref 0.44–1.00)
GFR calc Af Amer: >60 mL/min (ref >60)
GFR calc non Af Amer: >60 mL/min (ref >60)
Glucose, Bld: 150 mg/dL — ABNORMAL HIGH (ref 70–99)
Potassium: 4 mmol/L (ref 3.5–5.1)
Sodium: 139 mmol/L (ref 135–145)
Total Bilirubin: 0.5 mg/dL (ref 0.3–1.2)
Total Protein: 5.4 g/dL — ABNORMAL LOW (ref 6.5–8.1)

## 2019-06-21 LAB — GLUCOSE, CAPILLARY
Glucose-Capillary: 153 mg/dL — ABNORMAL HIGH (ref 70–99)
Glucose-Capillary: 135 mg/dL — ABNORMAL HIGH (ref 70–99)
Glucose-Capillary: 159 mg/dL — ABNORMAL HIGH (ref 70–99)

## 2019-06-21 LAB — MAGNESIUM: Magnesium: 2.4 mg/dL (ref 1.7–2.4)

## 2019-06-21 LAB — PHOSPHORUS: Phosphorus: 3.6 mg/dL (ref 2.5–4.6)

## 2019-06-21 MED ORDER — OLAPARIB 100 MG PO TABS: 100.0000 mg | ORAL_TABLET | Freq: Two times a day (BID) | ORAL | 0 refills | Status: DC

## 2019-06-21 MED ORDER — INSULIN ASPART 100 UNIT/ML ~~LOC~~ SOLN
0.0000 [IU] | Freq: Four times a day (QID) | SUBCUTANEOUS | Status: DC
  Administered 2019-06-21: 2 [IU] via SUBCUTANEOUS
  Administered 2019-06-21 – 2019-06-22 (×2): 3 [IU] via SUBCUTANEOUS
  Administered 2019-06-22 (×2): 2 [IU] via SUBCUTANEOUS

## 2019-06-21 MED ORDER — TRAVASOL 10 % IV SOLN
INTRAVENOUS | Status: AC
  Administered 2019-06-21: 18:00:00 via INTRAVENOUS
  Filled 2019-06-21: qty 1036.8

## 2019-06-21 MED ORDER — METOCLOPRAMIDE HCL 5 MG/ML IJ SOLN
5.0000 mg | Freq: Three times a day (TID) | INTRAMUSCULAR | Status: DC
  Administered 2019-06-21 – 2019-06-23 (×8): 5 mg via INTRAVENOUS
  Filled 2019-06-21 (×8): qty 2

## 2019-06-21 NOTE — TOC Progression Note (Addendum)
Transition of Care [facility]) - Progression Note    Patient Details  Name: Julie Rollins MRN: [id number] Date of Birth: Apr 21, 1946  Transition of Care [facility]) CM/SW [place], [place] Phone Number: 06/21/2019, 1:09 PM  Clinical Narrative:    Patient is agreeable to SNF placement. Patient gave CSW permission to fax information to SNF in the area that manages TPN. CSW will follow up with bed offers. CSW explain to the patient she will need insurance to authorize her SNF stay. Patient reports understanding.   4:24pm-[employer] requested to do a peer to peer. CSW notified physician and provided contact information [phone].     Barriers to Discharge: Insurance Authorization, COVID-19 test results (within 48 hours of discharge).  Expected Discharge Plan and Services  Home                                    HH Arranged: TPN HH Agency: [facility], [place] Date [facility] Agency Contacted: 06/20/19 Time [place]: 1214 Representative spoke with at [place]: Jeannene Patella and Georgina Snell   Social Determinants of Health ([place]) Interventions    Readmission Risk Interventions No flowsheet data found.

## 2019-06-21 NOTE — Progress Notes (Signed)
PROGRESS NOTE  JESS SULAK QQV:[id number] DOB: 14-Nov-1946 DOA: 06/13/2019 PCP: Nolene Ebbs, MD  Brief History   [name] a 73 y.o.femalewith medical history significantforanxiety and depression, history of breast cancer status post lumpectomy and radiation 2000, history of dysrhythmia, hypertension, history of hypothyroidism, recently diagnosed pancreatic cancer who recently underwent an EUS on 06/09/2019 for fiducial markings of the pancreas head mass. 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 Since her procedure she states that she has had significant nausea and vomiting and has inability to keep anything down. Recently was seen in the ED yesterday and sent home with IV fluids and Compazine with no improvement and presented to her oncologist with similar complaints. States that she has not had a proper meal and at least 2 days. Denies any lightheadedness or dizziness but does feel weak. No chest pain, lightheadedness or dizziness. Has not had a bowel movement today she states from not eating properly.   Today she was seen by her Gastroenterologist and they referred her as a direct admission to [place] [facility]. 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  Medical oncology has been consulted, as has GI and general surgery. In a group text it was determined that the patient required nutritional support before she could undergo stenting of the duodenum. Dr. Barry Dienes was consulted and will place J-tube laparoscopically to allow for enteral nutrition. Eventually the plan will be for Dr. Barry Dienes to perform a whipple.  I also discussed the patient with radiation oncology. The patient will go to Rad Tx today to get set up for radiation therapy which will start on8/02/2019. This therapy will not  be palliative and is not expected to reduce obstruction. I have explained this to the patient and she has voiced understanding.  UGI yesterday was negative for findings consistent with GOO. An attempt was made to clamp her NGT yesterday, but patietn was unable to tolerate it. Will try again today. So far today the patient is tolerating clears. 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 Recommendation is for TPN per pharmacy to improve the patient's nutritional status, and planning for laparoscopy and whipple in 5-6 weeks. Dietician has been consulted.  Consultants  . General surgery . Radiation Oncology . Medical Oncology . Gastroenterology  Procedures  . None  Antibiotics   Anti-infectives (From admission, onward)   None      Subjective  The patient is Sitting up in bed. No new complaints.  Objective   Vitals:  Vitals:   06/21/19 0950 06/21/19 1226  BP: 132/65 130/63  Pulse:  90  Resp:  18  Temp:  97.8 F (36.6 C)  SpO2:  99%    Exam:  Constitutional:  . The patient is awake, alert, and oriented x 3. No acute distress. Respiratory:  . CTA bilaterally, no w/r/r.  . Respiratory effort normal. No retractions or accessory muscle use Cardiovascular:  . RRR, no m/r/g . No LE extremity edema   . Normal pedal pulses Abdomen:  . Abdomen is somewhat less distended and less tender.  . No hernias, masses, or organomegaly is appreciated. [place] Kitchen Hypoactive bowel sounds.  Musculoskeletal:  . No cyanosis, clubbing, or edema Skin:  . No rashes, lesions, ulcers . palpation of skin: no induration or nodules Neurologic:  .  CN 2-12 intact . Sensation all 4 extremities intact Psychiatric:  . Mental status o Mood, affect appropriate o Orientation to person, place, time  . judgment and insight appear intact   I have personally reviewed the following:   Today's Data  . CMP, prealbumin, CBC, Iron studies   Scheduled Meds: . chlorhexidine  15 mL Mouth Rinse BID  . diltiazem  180 mg Oral Daily  . feeding supplement  1 Container Oral BID BM  . hydrocortisone   Rectal BID  . insulin aspart  0-15 Units Subcutaneous Q6H  . levothyroxine  25 mcg Intravenous Daily  . mouth rinse  15 mL Mouth Rinse q12n4p  . metoCLOPramide (REGLAN) injection  5 mg Intravenous TID AC & HS  . metoprolol tartrate  2.5 mg Intravenous Q12H  . mirtazapine  7.5 mg Oral QHS  . pantoprazole (PROTONIX) IV  40 mg Intravenous Q12H  . potassium chloride  40 mEq Oral Once   Continuous Infusions: . TPN ADULT (ION) 80 mL/hr at 06/20/19 1732  . TPN ADULT (ION)      Active Problems:   Generalized weakness   Abnormal finding on GI tract imaging   Pancreatic mass   Hypothyroidism   Malignant neoplasm of head of pancreas (HCC)   Personal history of breast cancer   Intractable nausea and vomiting   Partial gastric outlet obstruction   Encounter for nasogastric (NG) tube placement   LOS: 8 days   A & P  Abdominal pain and intractable nausea and vomiting: Upper GI was performed yesterday that was negative for GOO. Thought to be due to pancreatic mass, although imaging does not especially support this. Radiation oncology states that their therapy which is planned to begin on 06/28/2019 will not be palliative and is not expected to resolve any obstruction. General surgery will take the patient for laparoscopy and whipple in 5-6 weeks. The patient will have TPN as managed by pharmacy until then. Dietary has been consulted. She states that she is feeling quite a bit better today. I have discussed the patient with case management regarding the transitioning of her TPN to home. I will also discuss this with pharmacy as she will have to have a relatively stable forma for her TPN for this to work.  Pancreatic Cancer: S/P Folfirinox x 2 cycles. Patient was unable to tolerate this therapy and it was stopped. She will start Radiation therapy  (SBRT) on 06/28/2019, but will be set up for it during this inpatient stay. The plan is for whipple after radiation therapy is completed. She is going back to radiation oncology this morning for the radiologist to finalilze plans for her treatment.  Tachycardia: Rate appears controlled on oral cardizem and IV metoprolol. Blood pressures are within acceptable range. Continue IV fluids cautiously.  Prolonged QTc: I have decreased the dose of reglan from 10 mg qac and hs to 5 mg. Monitor Qtc.  Hypokalemia: Resolved. Continue to monitor.  Hyperglycemia: Actually hypoglycemic. The patient responded to D 50 x 2 and D5 NS at 50 cc /hr. Monitor. D 5 has been discontinued now that she is on TPN. I have discussed with pharmacy. She has required 6 units of insulin in the last 24 hours. I have discussed with pharmacy the possibility of delivering TPN to the patient at home. They assure me that there will be no problems with this if necessary.  Severe protein calorie malnutrition: Prealbumin of 7.5. TPN per pharmacy has been ordered. Dietician has been consulted.  Anxiety:  Continue mirtazapine 7.5 mg PO qhs.  Hypothyroidism: TSH is within normal limits. Continue levothyroxine at 50 mcg, but given IV.  Prolonged QTC: The patient is being monitored on telemetry. Monitor electrolytes. Avoid medications that will prolong QT.  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 Will supplement the patient's iron IV. Feraheme ordered.  SubQ hematoma: At this site of heparin injection. Will stop pharmacological DVT prophylaxis and start SCD's.  I have seen and examined this patient myself. I have spent 36 minutes in her evaluation and care.   DVT prophylaxis: SCD's CODE STATUS: Full Code Family Communication: No family present Disposition: tbd.  Rakeem Colley, DO [facility] Direct contact: see [email or web address]  7PM-7AM contact night coverage as above 06/21/2019, 1:03 PM   LOS: 2 days

## 2019-06-21 NOTE — NC FL2 (Signed)
[place] MEDICAID FL2 LEVEL OF CARE SCREENING TOOL     IDENTIFICATION  Patient Name: Julie Rollins Birthdate: July 08, 1946 Sex: female Admission Date (Current Location): 06/13/2019  [facility] and [place] Number:  Herbalist and Address:  [facility],  [name] [address], [place]      Provider Number: [id number]  Attending Physician Name and Address:  Karie Kirks, DO  Relative Name and Phone Number:       Current Level of Care: SNF Recommended Level of Care: [place] Prior Approval Number:    Date Approved/Denied:   PASRR Number: [id number]  Discharge Plan:      Current Diagnoses: Patient Active Problem List   Diagnosis Date Noted  . Encounter for nasogastric (NG) tube placement   . Partial gastric outlet obstruction   . Intractable nausea and vomiting 06/13/2019  . Genetic testing 06/08/2019  . BRCA2 gene mutation positive in female 06/08/2019  . Family history of bladder cancer   . Family history of stomach cancer   . Family history of throat cancer   . Personal history of breast cancer   . Port-A-Cath in place 04/27/2019  . Malignant neoplasm of head of pancreas ([place]) 04/04/2019  . Pancreatic mass 03/28/2019  . Elevated LFTs 03/28/2019  . Hypothyroidism 03/28/2019  . Diarrhea 03/28/2019  . Generalized weakness   . Jaundice   . Abnormal finding on GI tract imaging   . Elevated alkaline phosphatase level   . Cervical spine fracture ([place]) 07/31/2018    Orientation RESPIRATION BLADDER Height & Weight     Self, Time, Situation, Place  Normal Continent Weight: 119 lb 14.9 oz (54.4 kg) Height:  '5\' 4"'$  (162.6 cm)  BEHAVIORAL SYMPTOMS/MOOD NEUROLOGICAL BOWEL NUTRITION STATUS      Continent Diet(TPN)  AMBULATORY STATUS COMMUNICATION OF NEEDS Skin   Limited Assist Verbally Normal                       Personal Care Assistance Level of Assistance  Bathing, Feeding, Dressing Bathing Assistance: Limited  assistance Feeding assistance: Independent Dressing Assistance: Limited assistance     Functional Limitations Info  Sight, Hearing, Speech Sight Info: Impaired Hearing Info: Adequate Speech Info: Adequate    SPECIAL CARE FACTORS FREQUENCY  PT (By licensed PT), OT (By licensed OT)     PT Frequency: 5x/week OT Frequency: 5x/week            Contractures Contractures Info: Not present    Additional Factors Info  Allergies, Psychotropic, Insulin Sliding Scale Code Status Info: Fullcode Allergies Info: Allergies: Codeine Psychotropic Info: Remeron Insulin Sliding Scale Info: See discharge summary       Current Medications (06/21/2019):  This is the current hospital active medication list Current Facility-Administered Medications  Medication Dose Route Frequency Provider Last Rate Last Dose  . bisacodyl (DULCOLAX) suppository 10 mg  10 mg Rectal Daily PRN Gatha Mayer, MD      . chlorhexidine (PERIDEX) 0.12 % solution 15 mL  15 mL Mouth Rinse BID Gatha Mayer, MD   15 mL at 06/20/19 2218  . diltiazem (CARDIZEM CD) 24 hr capsule 180 mg  180 mg Oral Daily Gatha Mayer, MD   180 mg at 06/21/19 0950  . feeding supplement (BOOST / RESOURCE BREEZE) liquid 1 Container  1 Container Oral BID BM Meuth, Brooke A, PA-C   1 Container at 06/20/19 1244  . hydrocortisone (ANUSOL-HC) 2.5 % rectal cream   Rectal  BID Gatha Mayer, MD      . HYDROmorphone (DILAUDID) injection 1 mg  1 mg Intravenous Q3H PRN Gatha Mayer, MD      . insulin aspart (novoLOG) injection 0-15 Units  0-15 Units Subcutaneous Q6H Luiz Ochoa, [place] Day   2 Units at 06/21/19 1151  . levothyroxine (SYNTHROID, LEVOTHROID) injection 25 mcg  25 mcg Intravenous Daily Gatha Mayer, MD   25 mcg at 06/21/19 [phone]  . MEDLINE mouth rinse  15 mL Mouth Rinse q12n4p Gatha Mayer, MD   15 mL at 06/21/19 1152  . metoCLOPramide (REGLAN) injection 5 mg  5 mg Intravenous TID AC & HS Swayze, Ava, DO      . metoprolol tartrate  (LOPRESSOR) injection 2.5 mg  2.5 mg Intravenous Q12H Gatha Mayer, MD   2.5 mg at 06/21/19 0949  . mirtazapine (REMERON) tablet 7.5 mg  7.5 mg Oral QHS Gatha Mayer, MD   7.5 mg at 06/20/19 2219  . ondansetron (ZOFRAN) injection 4 mg  4 mg Intravenous Q6H PRN Gatha Mayer, MD      . pantoprazole (PROTONIX) injection 40 mg  40 mg Intravenous Q12H Gatha Mayer, MD   40 mg at 06/21/19 0948  . phenol (CHLORASEPTIC) mouth spray 1 spray  1 spray Mouth/Throat PRN Gatha Mayer, MD      . potassium chloride SA (K-DUR) CR tablet 40 mEq  40 mEq Oral Once Gatha Mayer, MD      . senna-docusate (Senokot-S) tablet 1 tablet  1 tablet Oral QHS PRN Gatha Mayer, MD      . sodium chloride flush (NS) 0.9 % injection 10-40 mL  10-40 mL Intracatheter PRN Gatha Mayer, MD   10 mL at 06/17/19 9604  . TPN ADULT (ION)   Intravenous Continuous TPN Luiz Ochoa, RPH 80 mL/hr at 06/20/19 1732    . TPN ADULT (ION)   Intravenous Continuous TPN Luiz Ochoa, RPH      . traMADol Veatrice Bourbon) tablet 50 mg  50 mg Oral Q6H PRN Gatha Mayer, MD         Discharge Medications: Please see discharge summary for a list of discharge medications.  Relevant Imaging Results:  Relevant Lab Results:   Additional Information [id number]  Lia Hopping, LCSW

## 2019-06-21 NOTE — Progress Notes (Signed)
[place] NOTE   Pharmacy Consult for TPN Indication: intolerance to enteral feeding  Patient Measurements: Height: 5\' 4"  (162.6 cm) Weight: 119 lb 14.9 oz (54.4 kg) IBW/kg (Calculated) : 54.7 TPN AdjBW (KG): 53.2 Body mass index is 20.59 kg/m.   HPI:  59 yoF with pancreatic Ca, hx of jaundice, biliary stent. No chemo, plan XRT, Whipple procedure. Planning short course of TPN prior to surgery to improve nutritional status.   TPN Access: PAC TPN start date: 7/24  NUTRITIONAL GOALS                                                                                             RD recs (7/24):  Kcal:  1930-2190 kcal Protein:  105-120 grams Fluid:  >/= 2.2 L/day  Custom TPN at goal rate of 50ml/hr provides: -  103.68 g/day protein (54 g/L) -   45 g/day Lipid  (23.5 g/L) -   192 g/day Dextrose (10 %) -   1518 Kcal/day (reformulated 7/28 to decrease dextrose content slightly and increase lipid content)  Current Nutrition: clear liquid diet, Boost Breeze BID between meals - each delivers 9g protein, 250 kCal  IVF: none  Insulin Requirements: 8 units in past 24 hours via sliding scale   Significant events: 7/24 EGD - duodenal obstruction, elevated CBG x 2, received Dexamethasone 8mg  IV x1   Recent Labs    06/20/19 0432 06/21/19 0411  NA 142 139  K 3.7 4.0  CL 108 107  CO2 26 27  GLUCOSE 137* 150*  BUN 18 23  CREATININE 0.39* 0.44  CALCIUM 8.6* 8.8*  PHOS 4.1 3.6  MG 2.1 2.4  ALBUMIN 2.7* 2.8*  ALKPHOS 89 90  AST 23 21  ALT 28 28  BILITOT 0.2* 0.5  TRIG 104  --   PREALBUMIN 18.4  --    ASSESSMENT                                                                                                           Glucose - CBGs 136-211   Electrolytes - all WNL, Mag on ULN    Renal - SCr WNL  LFTs -  WNL   TGs - 134 (7/24), 104 (7/27)  Prealbumin - 7.5 (7/22), 5.9 (7/24), 18.4 (7/27)  PLAN  At 1800 today  Continue TPN at goal rate of 80 ml/hr. TPN + Boost Breeze will meet 100 % of goal kcal and goal AA.   Electrolytes in TPN: Continue decreased Na at 40 mEq/L, continue increased K+ at 60 mEq/L (to keep K+ at least 4 with QT prolongation earlier this admission), continue decreased Phos at 10 mmol/L, decrease Mag to 4 mEq/L, Ca at standard concentration, Cl:Ac ratio: max acetate.   TPN to contain standard multivitamins daily. 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   Change to moderate SSI q6h.   TPN lab panels on Mondays & Thursdays.  BMET, Mag, Phos in AM.   F/u daily.    Lindell Spar, PharmD, BCPS Clinical Pharmacist  06/21/2019 9:53 AM

## 2019-06-21 NOTE — Progress Notes (Signed)
I called the patient to let her know we needed to replan her treatment since the j tube was placed. She will come down at 11 am for this.     Carola Rhine, PAC

## 2019-06-21 NOTE — Telephone Encounter (Signed)
Oral Oncology Pharmacist Encounter  Received new prescription for Lynparza (olaparib) for the treatment of early stage pancreatic adenocarcinoma, may not be a candidate for resection, BRCA2 mutation positive, planned duration until disease progression or unacceptable toxicity.  Original diagnosis in May 2020 Patient received 2 cycles of neoadjuvant FOLFIRINOX with very poor toleration She is under evaluation to initiate 2nd line treatment with Lynparza dosed at '300mg'$  BID to start after completion of SBRT  Patient is currently admitted since 06/13/19 due to intractable nausea and vomiting likely secondary to partial gastric outlet obstruction. Patient is currently on TPN and some enteral feedings  Labs from Epic assessed SCr=0.44, round to 1.0 for age > 29, est CrCl ~ 42 mL/min Manufacturer recommends dose reduction to '200mg'$  BID for CrCl 31-50 mL/min, this will be discussed with MD  Current medication list in Epic reviewed, DDI with Lynparza and diltiazem identified:  Category D interaction: diltiazem is a CYP3A4 inhibitor leading to likely decreased metabolism and increased systemic exposure to the olaparib. Manufacturer recommends dose reduction of olaparib by 50% if concurrent administration cannot be avoided. With dose reduction due to renal function, recommend dose of olaparib be reduced further to '100mg'$  BID at initiation. This can be increased based on toleration.  Prescription has been e-scribed to the [facility] for benefits analysis and approval.  Oral Oncology Clinic will continue to follow for insurance authorization, copayment issues, initial counseling and start date.  Johny Drilling, PharmD, BCPS, BCOP  06/21/2019 1:17 PM Oral Oncology Clinic [phone]

## 2019-06-21 NOTE — Progress Notes (Signed)
Occupational Therapy Treatment Patient Details Name: Julie Rollins MRN: [id number] DOB: February 10, 1946 Today's Date: 06/21/2019    History of present illness 73 y.o. 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   OT comments  Pt overall S- mod I with ADL activity with OT.  Pt wants SNF as she lives alone and is worried about TPN  Follow Up Recommendations  Home health OT;SNF(pt wants SNF)    Equipment Recommendations  None recommended by OT    Recommendations for Other Services      Precautions / Restrictions Precautions Precautions: Fall Restrictions Weight Bearing Restrictions: No       Mobility Bed Mobility Overal bed mobility: Modified Independent                Transfers Overall transfer level: Modified independent Equipment used: None Transfers: Sit to/from [employer] Transfers   Stand pivot transfers: Modified independent (Device/Increase time)            Balance Overall balance assessment: Needs assistance Sitting-balance support: No upper extremity supported;Feet supported Sitting balance-Leahy Scale: Good     Standing balance support: No upper extremity supported;During functional activity Standing balance-Leahy Scale: Good                             ADL either performed or assessed with clinical judgement   ADL Overall ADL's : Needs assistance/impaired         Upper Body Bathing: Standing;Modified independent               Toilet Transfer: Supervision/safety;Ambulation;Cueing for safety   Toileting- Clothing Manipulation and Hygiene: Sit to/from stand;Supervision/safety       Functional mobility during ADLs: Supervision/safety General ADL Comments: Pt overall S with ADL task in room as well as walking in the hallway.                Cognition Arousal/Alertness: Awake/alert Behavior During Therapy: WFL for tasks assessed/performed Overall Cognitive Status: Within Functional Limits for tasks assessed                                                     Pertinent Vitals/ Pain       Pain Assessment: No/denies pain         Frequency  Min 2X/week        Progress Toward Goals  OT Goals(current goals can now be found in the care plan section)  Progress towards OT goals: Progressing toward goals     Plan Discharge plan needs to be updated(pt wants SNF due to TPN)       AM-PAC OT "6 Clicks" Daily Activity     Outcome Measure   Help from another person eating meals?: Total Help from another person taking care of personal grooming?: None Help from another person toileting, which includes using toliet, bedpan, or urinal?: None Help from another person bathing (including washing, rinsing, drying)?: None Help from another person to put on and taking off regular upper body clothing?: None Help from another person to put on and taking off regular lower body clothing?: None 6 Click Score: 21    End of  Session    OT Visit Diagnosis: Muscle weakness (generalized) (M62.81)   Activity Tolerance Patient tolerated treatment well   Patient Left in bed   Nurse Communication Mobility status        Time: 0228-0240 OT Time Calculation (min): 12 min  Charges: OT General Charges $OT Visit: 1 Visit OT Treatments $Self Care/Home Management : 8-22 mins  Kari Baars, OT Acute Rehabilitation Services [phone] Office- [phone]      Alberto Schoch, Edwena Felty D 06/21/2019, 3:02 PM

## 2019-06-22 ENCOUNTER — Telehealth: Payer: Self-pay

## 2019-06-22 DIAGNOSIS — E43 Unspecified severe protein-calorie malnutrition: Secondary | ICD-10-CM | POA: Insufficient documentation

## 2019-06-22 DIAGNOSIS — E440 Moderate protein-calorie malnutrition: Secondary | ICD-10-CM | POA: Insufficient documentation

## 2019-06-22 LAB — BASIC METABOLIC PANEL
Anion gap: 7 (ref 5–15)
BUN: 21 mg/dL (ref 8–23)
CO2: 26 mmol/L (ref 22–32)
Calcium: 8.8 mg/dL — ABNORMAL LOW (ref 8.9–10.3)
Chloride: 106 mmol/L (ref 98–111)
Creatinine, Ser: 0.4 mg/dL — ABNORMAL LOW (ref 0.44–1.00)
GFR calc Af Amer: >60 mL/min (ref >60)
GFR calc non Af Amer: >60 mL/min (ref >60)
Glucose, Bld: 158 mg/dL — ABNORMAL HIGH (ref 70–99)
Potassium: 4.2 mmol/L (ref 3.5–5.1)
Sodium: 139 mmol/L (ref 135–145)

## 2019-06-22 LAB — GLUCOSE, CAPILLARY
Glucose-Capillary: 149 mg/dL — ABNORMAL HIGH (ref 70–99)
Glucose-Capillary: 150 mg/dL — ABNORMAL HIGH (ref 70–99)
Glucose-Capillary: 162 mg/dL — ABNORMAL HIGH (ref 70–99)
Glucose-Capillary: 165 mg/dL — ABNORMAL HIGH (ref 70–99)
Glucose-Capillary: 186 mg/dL — ABNORMAL HIGH (ref 70–99)

## 2019-06-22 LAB — PHOSPHORUS: Phosphorus: 3.6 mg/dL (ref 2.5–4.6)

## 2019-06-22 LAB — MAGNESIUM: Magnesium: 2.4 mg/dL (ref 1.7–2.4)

## 2019-06-22 MED ORDER — INSULIN ASPART 100 UNIT/ML ~~LOC~~ SOLN: 0.0000 [IU] | Freq: Four times a day (QID) | SUBCUTANEOUS | Status: DC

## 2019-06-22 MED ORDER — TRAVASOL 10 % IV SOLN
INTRAVENOUS | Status: AC
  Administered 2019-06-22: 18:00:00 via INTRAVENOUS
  Filled 2019-06-22: qty 1036.8

## 2019-06-22 MED ORDER — INSULIN ASPART 100 UNIT/ML ~~LOC~~ SOLN
0.0000 [IU] | Freq: Four times a day (QID) | SUBCUTANEOUS | Status: DC
  Administered 2019-06-22 – 2019-06-23 (×3): 3 [IU] via SUBCUTANEOUS

## 2019-06-22 NOTE — Progress Notes (Addendum)
Nutrition Follow-up  DOCUMENTATION CODES:   Non-severe (moderate) malnutrition in context of chronic illness  INTERVENTION:  - continue Boost Breeze BID. - continue to encourage PO intakes. - continue TPN regimen per Pharmacy.  - will monitor for timing of J-tube placement.    NUTRITION DIAGNOSIS:   Moderate Malnutrition related to chronic illness, cancer and cancer related treatments as evidenced by mild fat depletion, mild muscle depletion. -revised  GOAL:   Patient will meet greater than or equal to 90% of their needs -met with TPN regimen + PO intakes  MONITOR:   PO intake, Supplement acceptance, Diet advancement, Labs, Weight trends, Other (Comment)(TPN regimen)  ASSESSMENT:   73 y.o. female with medical history significant for anxiety and depression, history of breast cancer s/p lumpectomy and radiation in 2000, history of dysrhythmia, HTN, hypothyroidism, recently diagnosed pancreatic cancer who underwent an EUS on 7/16 for fiducial markings of the pancreas head mass.  It is noted that she had partial GOO.  Since her procedure she has had significant N/V and inability to keep anything down. She was seen in the ED on 7/19 and sent home with IV fluids and Compazine with no improvement and presented to her oncologist on 7/19 with similar complaints. Denied any lightheadedness or dizziness but does feel weak. Today (7/20) she was seen by her gastroenterologist and referred as a direct admission to [facility]. TRH was asked to admit for intractable N/V likely in the setting of GOO which is partial; GI consulted and recommended NGT placement.  Patient remains on CLD and consumed 50% of breakfast and lunch yesterday. She has been accepting Boost Breeze 90% of the time offered. Weight has been stable since admission (7/20). Patient has a L chest implanted port through which she is receiving custom TPN at goal rate of 80 ml/hr.   The plan is for J-tube placement by Dr. Barry Dienes. Patient  to remain on TPN and CLD only until that time.   Per notes: - pancreatic cancer s/p 2 cycles of folfirinox--stopped d/t intolerance - plan to start radiation on 8/4   Labs reviewed; CBGs: 149 and 150 mg/dl today, creatinine: 0.4 mg/dl.  Medications reviewed; sliding scale novolog, 25 mcg IV synthroid/day, 5 mg IV reglan TID, 40 mg IV protonix BID.     NUTRITION - FOCUSED PHYSICAL EXAM:    Most Recent Value  Orbital Region  Mild depletion  Upper Arm Region  Mild depletion  Thoracic and Lumbar Region  No depletion  Buccal Region  Mild depletion  Temple Region  No depletion  Clavicle Bone Region  Mild depletion  Clavicle and Acromion Bone Region  Mild depletion  Scapular Bone Region  Mild depletion  Dorsal Hand  No depletion  Patellar Region  Unable to assess  Anterior Thigh Region  Unable to assess  Posterior Calf Region  Unable to assess  Edema (RD Assessment)  Unable to assess  Hair  Reviewed  Eyes  Reviewed  Mouth  Reviewed  Skin  Reviewed  Nails  Reviewed       Diet Order:   Diet Order            Diet clear liquid Room service appropriate? Yes; Fluid consistency: Thin  Diet effective now              EDUCATION NEEDS:   No education needs have been identified at this time  Skin:  Skin Assessment: Reviewed RN Assessment  Last BM:  7/28  Height:   Ht Readings from  Last 1 Encounters:  06/17/19 '5\' 4"'$  (1.626 m)    Weight:   Wt Readings from Last 1 Encounters:  06/22/19 53 kg    Ideal Body Weight:  54.5 kg  BMI:  Body mass index is 20.06 kg/m.  Estimated Nutritional Needs:   Kcal:  1930-2190 kcal  Protein:  105-120 grams  Fluid:  >/= 2.2 L/day     Jarome Matin, MS, RD, LDN, [facility] Inpatient Clinical Dietitian Pager # [phone] After hours/weekend pager # [phone]

## 2019-06-22 NOTE — Progress Notes (Signed)
Physical Therapy Treatment and Discharge from Acute PT Patient Details Name: Julie Rollins MRN: [id number] DOB: 1946-02-08 Today's Date: 06/22/2019    History of Present Illness 73 y.o. 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    PT Comments    Pt up in bathroom independently upon entering room and ambulated well in hallway.  Pt currently modified independent with mobility.  Pt has met acute PT goals so will d/c from acute PT at this time.  Pt can f/u with HHPT upon d/c.    Follow Up Recommendations  Home health PT     Equipment Recommendations  None recommended by PT    Recommendations for Other Services       Precautions / Restrictions Precautions Precautions: None    Mobility  Bed Mobility Overal bed mobility: Modified Independent                Transfers Overall transfer level: Modified independent Equipment used: None Transfers: Sit to/from Stand              Ambulation/Gait Ambulation/Gait assistance: Supervision;Modified independent (Device/Increase time) Gait Distance (Feet): 400 Feet Assistive device: IV Pole Gait Pattern/deviations: WFL(Within Functional Limits) Gait velocity: slow   General Gait Details: pt able to ambulate with IV pole and no assistance   Stairs             Wheelchair Mobility    Modified Rankin (Stroke Patients Only)       Balance           Standing balance support: No upper extremity supported;During functional activity Standing balance-Leahy Scale: Good                              Cognition Arousal/Alertness: Awake/alert Behavior During Therapy: WFL for tasks assessed/performed Overall Cognitive Status: Within Functional Limits for tasks assessed                                         Exercises      General Comments        Pertinent Vitals/Pain Pain Assessment: No/denies pain    Home Living                      Prior Function            PT Goals (current goals can now be found in the care plan section) Progress towards PT goals: Goals met/education completed, patient discharged from PT    Frequency           PT Plan Other (comment)(d/c from acute PT)    Co-evaluation              AM-PAC PT "6 Clicks" Mobility   Outcome Measure  Help needed turning from your back to your side while in a flat bed without using bedrails?: None Help needed moving from lying on your back to sitting on the side of a flat bed without using bedrails?: None Help needed moving to and from a bed to a chair (including a wheelchair)?: None Help needed standing up from a chair using your arms (e.g., wheelchair or bedside chair)?: None Help needed to walk in hospital room?:  None Help needed climbing 3-5 steps with a railing? : A Little 6 Click Score: 23    End of Session   Activity Tolerance: Patient tolerated treatment well Patient left: in bed;with call bell/phone within reach   PT Visit Diagnosis: Other abnormalities of gait and mobility (R26.89)     Time: 0037-0488 PT Time Calculation (min) (ACUTE ONLY): 13 min  Charges:  $Gait Training: 8-22 mins                    Carmelia Bake, PT, [place] Office: [phone] Pager: [phone]  Trena Platt 06/22/2019, 12:42 PM

## 2019-06-22 NOTE — Telephone Encounter (Signed)
Oral Oncology Patient Advocate Encounter  Received notification from [facility] that prior authorization for Julie Rollins is required.  PA submitted on CoverMyMeds Key [id number] Status is pending  Oral Oncology Clinic will continue to follow.  [place] Patient [place] Phone [phone] Fax [phone] 06/22/2019    9:35 AM

## 2019-06-22 NOTE — Progress Notes (Signed)
Patient refused insulin.  States she feels its making her "sicK or nauseated"  Scheduled reglan given

## 2019-06-22 NOTE — TOC Transition Note (Addendum)
Transition of Care [facility]) - CM/SW Discharge Note   Patient Details  Name: Julie Rollins MRN: [id number] Date of Birth: 04/01/1946  Transition of Care [facility]) CM/SW Contact:  Lia Hopping, [place] Phone Number: 06/22/2019, 1:03 PM   Clinical Narrative:    Insurance denied SNF stay. CSW inquired about LTACH placement. [facility] insurance will not approve. [facility]-LTACH- does not accept patients on TPN.  Home Health orders placed.  CSW reached out to [facility] rep: Ledon Snare confirm TPN, supplies and initial instruction for home. CSW will follow up with the patient. Alvis Lemmings will provide RN.   1:30PM CSW received call from [facility] approved for therapy at SNF for TPN. CSW notified the physician.    Final next level of care: Home w Home Health Services Barriers to Discharge: No Barriers Identified   Patient Goals and CMS Choice   CMS Medicare.gov Compare Post Acute Care list provided to:: Patient Choice offered to / list presented to : Patient  Discharge Placement  Home                      Discharge Plan and Services                          HH Arranged: TPN HH Agency: [facility], [place] Date [facility] Agency Contacted: 06/20/19 Time [place]: 1214 Representative spoke with at [place]: Jeannene Patella and Georgina Snell  Social Determinants of Health ([place]) Interventions     Readmission Risk Interventions No flowsheet data found.

## 2019-06-22 NOTE — Telephone Encounter (Signed)
Oral Oncology Patient Advocate Encounter  Prior Authorization for Julie Rollins has been approved.    PA# [id number] Effective dates: 06/22/19 through 11/24/19  Oral Oncology Clinic will continue to follow.   [place] Patient [place] Phone [phone] Fax [phone] 06/22/2019    3:36 PM

## 2019-06-22 NOTE — Progress Notes (Signed)
PROGRESS NOTE  Julie Rollins:[id number] DOB: 08/20/1946 DOA: 06/13/2019 PCP: Nolene Ebbs, MD  Brief History   Julie Rollins Holderis a 73 y.o.femalewith medical history significantforanxiety and depression, history of breast cancer status post lumpectomy and radiation 2000, history of dysrhythmia, hypertension, history of hypothyroidism, recently diagnosed pancreatic cancer who recently underwent an EUS on 06/09/2019 for fiducial markings of the pancreas head mass. 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 Since her procedure she states that she has had significant nausea and vomiting and has inability to keep anything down. Recently was seen in the ED yesterday and sent home with IV fluids and Compazine with no improvement and presented to her oncologist with similar complaints. States that she has not had a proper meal and at least 2 days. Denies any lightheadedness or dizziness but does feel weak. No chest pain, lightheadedness or dizziness. Has not had a bowel movement today she states from not eating properly.   Today she was seen by her Gastroenterologist and they referred her as a direct admission to [place] [facility]. 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  Medical oncology has been consulted, as has GI and general surgery. In a group text it was determined that the patient required nutritional support before she could undergo stenting of the duodenum. Dr. Barry Dienes was consulted and will place J-tube laparoscopically to allow for enteral nutrition. Eventually the plan will be for Dr. Barry Dienes to perform a whipple.  I also discussed the patient with radiation oncology. The patient will go to Rad [facility] today to get set up for radiation therapy which will start on8/02/2019. This therapy will not  be palliative and is not expected to reduce obstruction. I have explained this to the patient and she has voiced understanding.  UGI yesterday was negative for findings consistent with GOO. An attempt was made to clamp her NGT yesterday, but patietn was unable to tolerate it. Will try again today. So far today the patient is tolerating clears. 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 Recommendation is for TPN per pharmacy to improve the patient's nutritional status, and planning for laparoscopy and whipple in 5-6 weeks. Dietician has been consulted.  The patient has been approved for SNF. Awaiting bed placement.  Consultants   General surgery  [facility]  Gastroenterology  Procedures   None  Antibiotics   Anti-infectives (From admission, onward)   None      Subjective  The patient is Sitting up in bed. No new complaints.  Objective   Vitals:  Vitals:   06/22/19 0603 06/22/19 1412  BP: (!) 144/68 131/67  Pulse: 91 97  Resp: 16 18  Temp: 98.2 F (36.8 C) 98.3 F (36.8 C)  SpO2: 97% 97%    Exam:  Constitutional:   The patient is awake, alert, and oriented x 3. No acute distress. Respiratory:   CTA bilaterally, no w/r/r.   Respiratory effort normal. No retractions or accessory muscle use Cardiovascular:   RRR, no m/r/g  No LE extremity edema    Normal pedal pulses Abdomen:   Abdomen is somewhat less distended and less tender.   No hernias, masses, or organomegaly is appreciated.  Hypoactive bowel sounds.  Musculoskeletal:   No cyanosis, clubbing, or edema Skin:  No rashes, lesions, ulcers  palpation of skin: no induration or nodules Neurologic:   CN 2-12 intact  Sensation all 4 extremities intact Psychiatric:   Mental status o Mood, affect appropriate o Orientation to person, place, time   judgment and insight appear intact   I have  personally reviewed the following:   Today's Data   CMP, prealbumin, CBC, Iron studies  Scheduled Meds:  chlorhexidine  15 mL Mouth Rinse BID   diltiazem  180 mg Oral Daily   feeding supplement  1 Container Oral BID BM   hydrocortisone   Rectal BID   insulin aspart  0-15 Units Subcutaneous QID   levothyroxine  25 mcg Intravenous Daily   mouth rinse  15 mL Mouth Rinse q12n4p   metoCLOPramide (REGLAN) injection  5 mg Intravenous TID AC & HS   metoprolol tartrate  2.5 mg Intravenous Q12H   mirtazapine  7.5 mg Oral QHS   pantoprazole (PROTONIX) IV  40 mg Intravenous Q12H   potassium chloride  40 mEq Oral Once   Continuous Infusions:  TPN ADULT (ION) 80 mL/hr at 93/81/01 7510   TPN CYCLIC-ADULT (ION)      Active Problems:   Generalized weakness   Abnormal finding on GI tract imaging   Pancreatic mass   Hypothyroidism   Malignant neoplasm of head of pancreas (HCC)   Personal history of breast cancer   Intractable nausea and vomiting   Partial gastric outlet obstruction   Encounter for nasogastric (NG) tube placement   Malnutrition of moderate degree   LOS: 9 days   A & P  Abdominal pain and intractable nausea and vomiting: Upper GI was performed yesterday that was negative for GOO. Thought to be due to pancreatic mass, although imaging does not especially support this. Radiation oncology states that their therapy which is planned to begin on 06/28/2019 will not be palliative and is not expected to resolve any obstruction. General surgery will take the patient for laparoscopy and whipple in 5-6 weeks. The patient will have TPN as managed by pharmacy until then. Dietary has been consulted. The patient has been approved for SNF. Awaiting a bed.  Pancreatic Cancer: S/P Folfirinox x 2 cycles. Patient was unable to tolerate this therapy and it was stopped. She will start Radiation therapy (SBRT) on 06/28/2019, but will be set up for it during this inpatient stay. The plan is  for whipple after radiation therapy is completed.  Tachycardia: Rate appears controlled on oral cardizem and IV metoprolol. Blood pressures are within acceptable range. Continue IV fluids cautiously.  Prolonged QTc: I have decreased the dose of reglan from 10 mg qac and hs to 5 mg. Monitor Qtc.  Hypokalemia: Resolved. Continue to monitor.  Hyperglycemia: Actually hypoglycemic. The patient responded to D 50 x 2 and D5 NS at 50 cc /hr. Monitor. D 5 has been discontinued now that she is on TPN. I have discussed with pharmacy. She has required 6 units of insulin in the last 24 hours. I have discussed with pharmacy the possibility of delivering TPN to the patient at home. They assure me that there will be no problems with this if necessary.  Severe protein calorie malnutrition: Prealbumin of 7.5. TPN per pharmacy has been ordered. Dietician has been consulted.  Anxiety: Continue mirtazapine 7.5 mg PO qhs.  Hypothyroidism: TSH is within normal limits. Continue levothyroxine at 50 mcg, but given IV.  Prolonged QTC: The patient is being monitored on telemetry. Monitor electrolytes. Avoid medications that will prolong  QT.  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 Will supplement the patient's iron IV. Feraheme ordered.  SubQ hematoma: At this site of heparin injection. Will stop pharmacological DVT prophylaxis and start SCD's.  I have seen and examined this patient myself. I have spent 32 minutes in her evaluation and care.   DVT prophylaxis: SCD's CODE STATUS: Full Code Family Communication: No family present Disposition: tbd.  Mistie Adney, DO [facility] Direct contact: see [email or web address]  7PM-7AM contact night coverage as above 06/22/2019, 4:34 PM  LOS: 2 days

## 2019-06-22 NOTE — Progress Notes (Signed)
[place] NOTE   Pharmacy Consult for TPN Indication: intolerance to enteral feeding  Patient Measurements: Height: 5\' 4"  (162.6 cm) Weight: 116 lb 13.5 oz (53 kg) IBW/kg (Calculated) : 54.7 TPN AdjBW (KG): 53.2 Body mass index is 20.06 kg/m.   HPI:  18 yoF with pancreatic Ca, hx of jaundice, biliary stent. No chemo, plan XRT, Whipple procedure. Planning short course of TPN prior to surgery to improve nutritional status.   TPN Access: PAC TPN start date: 7/24  NUTRITIONAL GOALS                                                                                             RD recs (7/29):  Kcal:  1930-2190 kcal Protein:  105-120 grams Fluid:  >/= 2.2 L/day  Custom TPN at goal rate of 23ml/hr provides: -  103.68 g/day protein (54 g/L) -   45 g/day Lipid  (23.5 g/L) -   192 g/day Dextrose (10 %) -   1518 Kcal/day (reformulated 7/28 to decrease dextrose content slightly and increase lipid content)  Current Nutrition: clear liquid diet, Boost Breeze BID between meals - each delivers 9g protein, 250 kCal  IVF: none  Insulin Requirements: 9 units in past 24 hours via sliding scale   Significant events: 7/24 EGD - duodenal obstruction, received Dexamethasone 8mg  IV x 1 7/29 Start cycling TPN at request of MD   Recent Labs    06/20/19 0432 06/21/19 0411 06/22/19 0419  NA 142 139 139  K 3.7 4.0 4.2  CL 108 107 106  CO2 26 27 26   GLUCOSE 137* 150* 158*  BUN 18 23 21   CREATININE 0.39* 0.44 0.40*  CALCIUM 8.6* 8.8* 8.8*  PHOS 4.1 3.6 3.6  MG 2.1 2.4 2.4  ALBUMIN 2.7* 2.8*  --   ALKPHOS 89 90  --   AST 23 21  --   ALT 28 28  --   BILITOT 0.2* 0.5  --   TRIG 104  --   --   PREALBUMIN 18.4  --   --    ASSESSMENT                                                                                                           Glucose - CBGs 135-159 (accept CBGs < 180)  Electrolytes - all WNL, Mag on ULN    Renal - SCr low, BUN  WNL  LFTs -  WNL (7/28)  TGs - 134 (7/24), 104 (7/27)  Prealbumin - 7.5 (7/22), 5.9 (7/24), 18.4 (7/27)  PLAN  At 1800 today:   Cycle TPN over 18 hours. 56 ml/hr over 1st hour, 113 ml/hr over next 16 hours, then 56 ml/hr over last hour.   TPN + Boost Breeze will meet 100 % of goal kcal and goal AA.   Electrolytes in TPN: Continue decreased Na at 40 mEq/L, continue increased K+ at 60 mEq/L (to keep K+ at least 4 with QT prolongation earlier this admission), continue decreased Phos at 10 mmol/L, continue decreased Mag at 4 mEq/L, Ca at standard concentration, Cl:Ac ratio: max acetate.   TPN to contain standard multivitamins daily. 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   Continue moderate SSI. CBGs QID- 2 hours past cyclic TPN start + middle of TPN infusion + 1 hour past cyclic TPN d/c + while off TPN.   TPN lab panels on Mondays & Thursdays.  F/u daily.    Lindell Spar, PharmD, BCPS Clinical Pharmacist  06/22/2019 12:18 PM

## 2019-06-23 ENCOUNTER — Other Ambulatory Visit: Payer: Self-pay

## 2019-06-23 DIAGNOSIS — E440 Moderate protein-calorie malnutrition: Secondary | ICD-10-CM

## 2019-06-23 LAB — COMPREHENSIVE METABOLIC PANEL
ALT: 29 U/L (ref 0–44)
AST: 24 U/L (ref 15–41)
Albumin: 3 g/dL — ABNORMAL LOW (ref 3.5–5.0)
Alkaline Phosphatase: 112 U/L (ref 38–126)
Anion gap: 8 (ref 5–15)
BUN: 24 mg/dL — ABNORMAL HIGH (ref 8–23)
CO2: 25 mmol/L (ref 22–32)
Calcium: 9.1 mg/dL (ref 8.9–10.3)
Chloride: 106 mmol/L (ref 98–111)
Creatinine, Ser: 0.44 mg/dL (ref 0.44–1.00)
GFR calc Af Amer: >60 mL/min (ref >60)
GFR calc non Af Amer: >60 mL/min (ref >60)
Glucose, Bld: 192 mg/dL — ABNORMAL HIGH (ref 70–99)
Potassium: 4.9 mmol/L (ref 3.5–5.1)
Sodium: 139 mmol/L (ref 135–145)
Total Bilirubin: 0.3 mg/dL (ref 0.3–1.2)
Total Protein: 6 g/dL — ABNORMAL LOW (ref 6.5–8.1)

## 2019-06-23 LAB — MAGNESIUM: Magnesium: 2.5 mg/dL — ABNORMAL HIGH (ref 1.7–2.4)

## 2019-06-23 LAB — GLUCOSE, CAPILLARY
Glucose-Capillary: 181 mg/dL — ABNORMAL HIGH (ref 70–99)
Glucose-Capillary: 176 mg/dL — ABNORMAL HIGH (ref 70–99)

## 2019-06-23 LAB — PHOSPHORUS: Phosphorus: 3.6 mg/dL (ref 2.5–4.6)

## 2019-06-23 MED ORDER — METOCLOPRAMIDE HCL 5 MG PO TABS: 5.0000 mg | ORAL_TABLET | Freq: Three times a day (TID) | ORAL | Status: DC

## 2019-06-23 MED ORDER — PANTOPRAZOLE SODIUM 40 MG PO TBEC: 40.0000 mg | DELAYED_RELEASE_TABLET | Freq: Two times a day (BID) | ORAL | Status: DC

## 2019-06-23 MED ORDER — TRAVASOL 10 % IV SOLN
INTRAVENOUS | Status: DC
  Filled 2019-06-23: qty 1036.8

## 2019-06-23 MED ORDER — HEPARIN SOD (PORK) LOCK FLUSH 100 UNIT/ML IV SOLN
500.0000 [IU] | INTRAVENOUS | Status: AC | PRN
  Administered 2019-06-23: 500 [IU]

## 2019-06-23 MED ORDER — HYDROCORTISONE (PERIANAL) 2.5 % EX CREA: TOPICAL_CREAM | Freq: Two times a day (BID) | CUTANEOUS | Status: AC

## 2019-06-23 NOTE — Progress Notes (Signed)
Patient discharging to [facility] rehab - report called to [facility] at facility.  Patient aware and getting belongings together.  Packet at desk.  R FA IV removed - WNL.  Patient in NAD at this time.

## 2019-06-23 NOTE — Progress Notes (Signed)
[place] NOTE   Pharmacy Consult for TPN Indication: intolerance to enteral feeding  Patient Measurements: Height: 5\' 4"  (162.6 cm) Weight: 113 lb 15.7 oz (51.7 kg) IBW/kg (Calculated) : 54.7 TPN AdjBW (KG): 53.2 Body mass index is 19.56 kg/m.   HPI:  23 yoF with pancreatic Ca, hx of jaundice, biliary stent. No chemo, plan XRT, Whipple procedure. Planning short course of TPN prior to surgery to improve nutritional status.   TPN Access: PAC TPN start date: 7/24  NUTRITIONAL GOALS                                                                                             RD recs (7/29):  Kcal:  1930-2190 kcal Protein:  105-120 grams Fluid:  >/= 2.2 L/day  Custom TPN at goal rate of 83ml/hr provides: -  103.68 g/day protein (54 g/L) -   45 g/day Lipid  (23.5 g/L) -   192 g/day Dextrose (10 %) -   1518 Kcal/day (reformulated 7/28 to decrease dextrose content slightly and increase lipid content)  Current Nutrition: clear liquid diet, Boost Breeze BID between meals - each delivers 9g protein, 250 kCal  IVF: none  Insulin Requirements: 9 units in past 24 hours via sliding scale (NOTE: refused 1700 dose of SSI yesterday)   Significant events: 7/24 EGD - duodenal obstruction, received Dexamethasone 8mg  IV x 1 7/29 Start cycling TPN at request of MD   Recent Labs    06/21/19 0411 06/22/19 0419 06/23/19 0454  NA 139 139 139  K 4.0 4.2 4.9  CL 107 106 106  CO2 27 26 25   GLUCOSE 150* 158* 192*  BUN 23 21 24*  CREATININE 0.44 0.40* 0.44  CALCIUM 8.8* 8.8* 9.1  PHOS 3.6 3.6 3.6  MG 2.4 2.4 2.5*  ALBUMIN 2.8*  --  3.0*  ALKPHOS 90  --  112  AST 21  --  24  ALT 28  --  29  BILITOT 0.5  --  0.3   ASSESSMENT                                                                                                           Glucose - CBGs 162-186 (goal CBGs < 180)  Electrolytes - K+ increased to 4.9, Mag slightly elevated at 2.5, all  others WNL    Renal - SCr WNL  LFTs -  WNL (7/28)  TGs - 134 (7/24), 104 (7/27)  Prealbumin - 7.5 (7/22), 5.9 (7/24), 18.4 (7/27)  PLAN  At 1800 today:   Continue to cycle TPN over 18 hours. 56 ml/hr over 1st hour, 113 ml/hr over next 16 hours, then 56 ml/hr over last hour. At this point, will not compress cycle further unless CBGs consistently remain < 180.   TPN + Boost Breeze will meet 100 % of goal kcal and goal AA.   Electrolytes in TPN: Continue decreased Na at 40 mEq/L, decrease K+ to 30 mEq/L (keep K+ at least 4 with QT prolongation earlier this admission), continue decreased Phos at 10 mmol/L, remove Mag, Ca at standard concentration, Cl:Ac ratio: max acetate.   TPN to contain standard multivitamins daily. 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   Continue moderate SSI. CBGs QID- 2 hours past cyclic TPN start + middle of TPN infusion + 1 hour past cyclic TPN d/c + while off TPN. May consider switching to q8h if patient continues to refuse some doses of her insulin.   TPN lab panels on Mondays & Thursdays.  CMET, Mag, Phos in AM.   F/u daily.    Lindell Spar, PharmD, BCPS Clinical Pharmacist  06/23/2019 12:45 PM

## 2019-06-23 NOTE — Progress Notes (Signed)
[employer] called.

## 2019-06-23 NOTE — Discharge Summary (Signed)
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  Secondary to pancreatic cancer  --GI recommended Reglan 4 times daily.  Zofran as needed. --Clear liquids per surgery.  Thin liquids per speech therapy. 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  Did not tolerate chemotherapy well and this was discontinued after cycle 2.  Status post ERCP with stenting of malignant stricture distal common bile duct 03/2019. --follow-up with Dr. [name]. Outpatient SBRT planned starting August 4. --Follow-up with Dr. Barry Dienes as an outpatient 3 weeks, eventual Whipple planned.  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  Hemoglobin A1c 4.01 Apr 2019. --Stable.  Follow-up as an outpatient. --Suggest CBG Q before meals and nightly.  Prolonged QTC --Resolved.  Follow-up EKG 7/30 independently interpreted showed sinus rhythm, acceptable QTC 476, telemetry sinus rhythm, no arrhythmias noted.  Currently on Reglan.  Suggest follow QTC periodically.   Follow-up Information    Stark Klein, MD Follow up in 3 week(s).   Specialty: General Surgery Contact information: [address] [place] [address] [phone]        Truitt Merle, MD Follow up.   Specialties: Hematology, Oncology Why: as directed Contact information: [place] [id number] [facility] Follow up.   Specialty: Radiation Oncology Why: as  directed Contact information: [place] [id number] [place] [phone]           Discharge Diagnoses: Principal diagnosis is #1 1. 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  Secondary to pancreatic cancer  2. Possible pancreatitis secondary to fiducial placement 3. 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 Hyperglycemia secondary to TNA 5. Hypothyroidism 6. Prolonged QTC 7. Normocytic anemia with minor rectal bleeding secondary to hemorrhoids.  8. Nonsevere moderate malnutrition   Discharge Condition: improved Disposition: SNF  Diet recommendation: TNA per protocol, clear liquid oral diet, no advancement  Filed Weights   06/21/19 0619 06/22/19 0606 06/23/19 0500  Weight: 54.4 kg 53 kg 51.7 kg    History of present illness:  73 year old woman PMH pancreatic cancer diagnosed May/2020, status post EUS 7/16 for fiducial marker placement at that time diagnosed with partial gastric outlet obstruction, seen in oncology clinic for anorexia, nausea, vomiting and referred for admission.   Hospital Course:  Seen by gastroenterology, treated with IV fluids and gastric decompression with NG tube.  Gastric outlet obstruction considered but ruled out by gastroenterology after negative upper GI series.  She was seen by radiation oncology as well as general surgery initially J-tube was planned, however surgery ultimately recommended course of TNA as a failed J-tube feedings would have higher risk of interruptions.  Underwent EGD with findings as below.  Subsequently stabilized on clear liquid diet with TNA needed for the short-term to improve nutrition, eventually J-tube placement and Whipple procedures are planned.  Individual issues as below.  Recommendation for SNF.  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  Secondary to pancreatic cancer  -- tolerating diet.  No nausea or vomiting.   -- GI recommended Reglan 4 times daily.  Zofran as needed. --Clear liquids per surgery.  Thin liquids per speech therapy.  Possible pancreatitis secondary to fiducial placement --Clinically resolved.  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  Did not tolerate chemotherapy well and this was discontinued after cycle 2.  Status post ERCP with stenting of malignant stricture distal common bile duct 03/2019. --follow-up with Dr. [name]. Outpatient SBRT planned starting August 4. --Follow-up with Dr. Barry Dienes as an outpatient 3 weeks, eventual Whipple planned.  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  Hemoglobin A1c 4.01 Apr 2019. --Stable.  Follow-up as an outpatient. --Suggest CBG Q before meals and nightly.  Hypothyroidism.  TSH within normal limits. --Stable.  Continue levothyroxine   Prolonged QTC --Resolved.  Follow-up EKG 7/30 independently interpreted showed sinus rhythm, acceptable QTC 476, telemetry sinus rhythm, no arrhythmias noted.  Currently on Reglan.  Suggest follow QTC periodically.  Normocytic anemia with minor rectal bleeding secondary to hemorrhoids. Thought also secondary to chronic disease and iron deficiency.  Treated with Feraheme. --Continue PPI  Nonsevere moderate malnutrition   Consultants   Gastroenterology  Medical oncology  Radiation oncology  General surgery  Procedures   7/24 EGD Impression: - Acquired duodenal stenosis. SUSPECT FROM TUMOR  AND MASS EFFECT - PARTIAL AND TIGHT - BILIARY STENT  COULD BE CONTRIBUTING OR CAUSING THIS BY IRRITATING   DUODENAL WALL BUT AT THIS POINT I DO NOT THINK SO - Slight fibrous food (residue) in the stomach.  LARGELY CLEAR OF FLUID AND DEBRIS - The examination was otherwise normal. - No specimens collected.  Today's assessment: S: Feels well, no nausea or vomiting.  Tolerating liquids.  No pain. O: Vitals:  Vitals:   06/22/19 2050 06/23/19 0559  BP: (!) 147/63 126/64  Pulse: 99 87  Resp: 16 16  Temp: 98.2 F (36.8 C) 98.1 F (36.7 C)  SpO2: 96% 96%    Constitutional:  . Appears calm and comfortable Respiratory:  . CTA bilaterally, no w/r/r.  . Respiratory effort normal. Cardiovascular:  . RRR, no m/r/g Abdomen:  . Soft, nontender, nondistended Psychiatric:  . Mental status o Mood, affect appropriate  Today's Data   CBG stable  BMP unremarkable except for mild hyperglycemia  Phosphorus and magnesium unremarkable.  LFTs unremarkable.  Lab Data   Reviewed since admission  Micro Data   None  Imaging   Reviewed since admission  Cardiology Data   Solitary EKG showed sinus tachycardia with left bundle branch block 7/20, this is old.  QTC was prolonged.   Discharge Instructions   Allergies as of 06/23/2019      Reactions   Codeine Nausea Only      Medication List    STOP taking these medications   HYDROcodone-acetaminophen 5-325 MG tablet Commonly known as: NORCO/VICODIN   prochlorperazine 10 MG tablet Commonly known as: COMPAZINE     TAKE these medications   diltiazem 180 MG 24 hr capsule Commonly known as: CARDIZEM CD Take 180 mg by mouth daily.   hydrocortisone 2.5 % rectal cream Commonly known as: ANUSOL-HC Place rectally 2 (two) times daily.   levothyroxine 50 MCG tablet Commonly known as: SYNTHROID Take 50 mcg by mouth every morning.   metoCLOPramide 5 MG tablet Commonly known as: REGLAN Take 1 tablet (5  mg  total) by mouth 4 (four) times daily -  before meals and at bedtime.   mirtazapine 7.5 MG tablet Commonly known as: REMERON Take 1 tablet (7.5 mg total) by mouth at bedtime.   olaparib 100 MG tablet Commonly known as: Lynparza Take 1 tablet (100 mg total) by mouth 2 (two) times daily. Swallow whole. May take with food to decrease nausea and vomiting.   ondansetron 8 MG disintegrating tablet Commonly known as: ZOFRAN-ODT Take 1 tablet (8 mg total) by mouth every 8 (eight) hours as needed for nausea or vomiting.   pantoprazole 40 MG tablet Commonly known as: Protonix Take 1 tablet (40 mg total) by mouth 2 (two) times daily.     TNA per home health and pharmacy protocol   Allergies  Allergen Reactions  . Codeine Nausea Only    The results of significant diagnostics from this hospitalization (including imaging, microbiology, ancillary and laboratory) are listed below for reference.    Significant Diagnostic Studies: Dg Abd 1 View  Result Date: 06/13/2019 CLINICAL DATA:  NG tube placement. EXAM: ABDOMEN - 1 VIEW COMPARISON:  Radiograph yesterday. CT 05/20/2019 FINDINGS: Tip of the enteric tube below the diaphragm in the stomach, side-port in the region of the gastroesophageal junction. Mild gaseous gastric distension. No other bowel dilatation. Biliary stent in the right upper quadrant with pneumobilia. No free air. IMPRESSION: Tip of the enteric tube below the diaphragm in the stomach, side-port in the region of the gastroesophageal junction. Advancement of 3-5 cm would lead to optimal placement. Electronically Signed   By: Keith Rake M.D.   On: 06/13/2019 19:59   Dg Abd 2 Views  Result Date: 06/13/2019 CLINICAL DATA:  Pancreatic cancer. Partial gastric outlet obstruction on EGD. Nausea and vomiting. EXAM: ABDOMEN - 2 VIEW COMPARISON:  May 20, 2019 FINDINGS: The common bile duct stent is stable. Pneumobilia is seen as expected. No other acute abnormalities. IMPRESSION: Stable  common bile duct stent and pneumobilia. No other acute abnormalities. Electronically Signed   By: Dorise Bullion III M.D   On: 06/13/2019 15:07   Dg Duanne Limerick W Single Cm (sol Or Thin Ba)  Result Date: 06/14/2019 CLINICAL DATA:  Gastric outlet obstruction? History of pancreatic cancer. EXAM: UPPER GI SERIES WITHOUT KUB TECHNIQUE: Routine upper GI series was performed with water soluble barium. FLUOROSCOPY TIME:  Fluoroscopy Time:  2 minutes and 1 second 14.4 mGy COMPARISON:  None. FINDINGS: Contrast was administered into the stomach via the indwelling nasogastric tube. Stomach was adequately distended with contrast revealing no mass, ulceration or other wall irregularity. Contrast passed through the normal caliber duodenum without evidence of obstruction or dysmotility. Incidental note made of reflux of contrast into the biliary system, presumably related to the recent stent placement and presumed sphincterotomy. IMPRESSION: 1. No gastric outlet obstruction. 2. No evidence of duodenal obstruction. Electronically Signed   By: Franki Cabot M.D.   On: 06/14/2019 14:13   [place] Ekg Site Rite  Result Date: 06/16/2019 If Site Rite image not attached, placement could not be confirmed due to current cardiac rhythm.   Microbiology: Recent Results (from the past 240 hour(s))  SARS Coronavirus 2 (CEPHEID - Performed in [place] hospital lab), Hosp Order     Status: None   Collection Time: 06/13/19  5:38 PM   Specimen: Nasopharyngeal Swab  Result Value Ref Range Status   SARS Coronavirus 2 NEGATIVE NEGATIVE Final    Comment: (NOTE) If result is NEGATIVE SARS-CoV-2 target nucleic acids are NOT DETECTED. The SARS-CoV-2  RNA is generally detectable in upper and lower  respiratory specimens during the acute phase of infection. The lowest  concentration of SARS-CoV-2 viral copies this assay can detect is 250  copies / mL. A negative result does not preclude SARS-CoV-2 infection  and should not be used as the sole  basis for treatment or other  patient management decisions.  A negative result may occur with  improper specimen collection / handling, submission of specimen other  than nasopharyngeal swab, presence of viral mutation(s) within the  areas targeted by this assay, and inadequate number of viral copies  (<250 copies / mL). A negative result must be combined with clinical  observations, patient history, and epidemiological information. If result is POSITIVE SARS-CoV-2 target nucleic acids are DETECTED. The SARS-CoV-2 RNA is generally detectable in upper and lower  respiratory specimens dur ing the acute phase of infection.  Positive  results are indicative of active infection with SARS-CoV-2.  Clinical  correlation with patient history and other diagnostic information is  necessary to determine patient infection status.  Positive results do  not rule out bacterial infection or co-infection with other viruses. If result is PRESUMPTIVE POSTIVE SARS-CoV-2 nucleic acids MAY BE PRESENT.   A presumptive positive result was obtained on the submitted specimen  and confirmed on repeat testing.  While 2019 novel coronavirus  (SARS-CoV-2) nucleic acids may be present in the submitted sample  additional confirmatory testing may be necessary for epidemiological  and / or clinical management purposes  to differentiate between  SARS-CoV-2 and other Sarbecovirus currently known to infect humans.  If clinically indicated additional testing with an alternate test  methodology [phone]) is advised. The SARS-CoV-2 RNA is generally  detectable in upper and lower respiratory sp ecimens during the acute  phase of infection. The expected result is Negative. Fact Sheet for Patients:  StrictlyIdeas.no Fact Sheet for Healthcare Providers: BankingDealers.co.za This test is not yet approved or cleared by the [place] FDA and has been authorized for detection  and/or diagnosis of SARS-CoV-2 by FDA under an Emergency Use Authorization (EUA).  This EUA will remain in effect (meaning this test can be used) for the duration of the COVID-19 declaration under Section 564(b)(1) of the Act, 21 U.S.C. section 360bbb-3(b)(1), unless the authorization is terminated or revoked sooner. Performed at [facility], [place] [address], [place], [place] [address]   Urine culture     Status: None   Collection Time: 06/13/19  5:39 PM   Specimen: Urine, Clean Catch  Result Value Ref Range Status   Specimen Description   Final    URINE, CLEAN CATCH Performed at [facility], [place] [address], [place], [place] [address]    Special Requests   Final    NONE Performed at [facility], [place] [address] [place], [place] [address]    Culture   Final    NO GROWTH Performed at [facility], [place] [address], [place],  [address]    Report Status 06/15/2019 FINAL  Final  Novel Coronavirus, NAA (hospital order; send-out to ref lab)     Status: None   Collection Time: 06/17/19  5:21 AM   Specimen: Nasopharyngeal Swab; Respiratory  Result Value Ref Range Status   SARS-CoV-2, NAA NOT DETECTED NOT DETECTED Final    Comment: (NOTE) This test was developed and its performance characteristics determined by [employer]. This test has not been FDA cleared or approved. This test has been authorized by FDA  under an Emergency Use Authorization (EUA). This test is only authorized for the duration of time the declaration that circumstances exist justifying the authorization of the emergency use of in vitro diagnostic tests for detection of SARS-CoV-2 virus and/or diagnosis of COVID-19 infection under section 564(b)(1) of the Act, 21 U.S.C. 468EHO-1(Y)(2), unless the authorization is terminated or revoked sooner. When diagnostic testing is negative, the possibility of a false negative result should be  considered in the context of a patient's recent exposures and the presence of clinical signs and symptoms consistent with COVID-19. An individual without symptoms of COVID-19 and who is not shedding SARS-CoV-2 virus would expect to have a negative (not detected) result in this assay. Performed  At: [facility] [place], [place] [id number] Rush Farmer MD WU:[id number]    [place]  Final    Comment: Performed at [facility] [address], [place], [place] [address]     Labs: Basic Metabolic Panel: Recent Labs  Lab 06/19/19 0403 06/20/19 0432 06/21/19 0411 06/22/19 0419 06/23/19 0454  NA 144 142 139 139 139  K 3.5 3.7 4.0 4.2 4.9  CL 114* 108 107 106 106  CO2 25 26 27 26 25   GLUCOSE 143* 137* 150* 158* 192*  BUN 12 18 23 21  24*  CREATININE 0.41* 0.39* 0.44 0.40* 0.44  CALCIUM 8.6* 8.6* 8.8* 8.8* 9.1  MG 2.3 2.1 2.4 2.4 2.5*  PHOS 3.0 4.1 3.6 3.6 3.6   Liver Function Tests: Recent Labs  Lab 06/17/19 0624 06/20/19 0432 06/21/19 0411 06/23/19 0454  AST 20 23 21 24   ALT 21 28 28 29   ALKPHOS 98 89 90 112  BILITOT 0.6 0.2* 0.5 0.3  PROT 5.3* 5.3* 5.4* 6.0*  ALBUMIN 2.6* 2.7* 2.8* 3.0*   CBC: Recent Labs  Lab 06/17/19 0624 06/20/19 0432  WBC 4.1 6.2  NEUTROABS 2.4 3.7  HGB 10.7* 11.0*  HCT 34.4* 35.3*  MCV 98.9 99.2  PLT 233 231    CBG: Recent Labs  Lab 06/22/19 0601 06/22/19 1154 06/22/19 1655 06/22/19 2046 06/23/19 0738  GLUCAP 150* 165* 162* 186* 181*    Active Problems:   Generalized weakness   Abnormal finding on GI tract imaging   Pancreatic mass   Hypothyroidism   Malignant neoplasm of head of pancreas (HCC)   Personal history of breast cancer   Intractable nausea and vomiting   Partial gastric outlet obstruction   Encounter for nasogastric (NG) tube placement   Malnutrition of moderate degree   Time coordinating discharge: 45 minutes  Signed:  Murray Hodgkins, MD  [facility]  06/23/2019, 12:49 PM

## 2019-06-24 DIAGNOSIS — Z510 Encounter for antineoplastic radiation therapy: Secondary | ICD-10-CM | POA: Diagnosis not present

## 2019-06-24 DIAGNOSIS — C250 Malignant neoplasm of head of pancreas: Secondary | ICD-10-CM | POA: Diagnosis not present

## 2019-06-27 ENCOUNTER — Telehealth: Payer: Self-pay | Admitting: Internal Medicine

## 2019-06-27 ENCOUNTER — Ambulatory Visit: Payer: Medicare HMO | Admitting: Radiation Oncology

## 2019-06-27 NOTE — Telephone Encounter (Signed)
I am trying to call Julie Rollins to confirm her ride and she is not picking up the phone. If patient does not confirm ride her ride will not be able to bring her to her appointment tomorrow. I can not leave a VM because there is no option too!

## 2019-06-27 NOTE — Telephone Encounter (Signed)
I am trying to contact pt for transportation for tomorrow. Since there is no answer no ride is confirmed

## 2019-06-27 NOTE — Telephone Encounter (Signed)
I tried to call patient again and there is no answer. Again, there is no option for VM. Radiology is aware that I have not been able to get to the pt and confirm ride.

## 2019-06-28 ENCOUNTER — Ambulatory Visit
Admission: RE | Admit: 2019-06-28 | Discharge: 2019-06-28 | Disposition: A | Discharge status: Home / Self Care | Payer: Medicare HMO | Source: Ambulatory Visit | Attending: Radiation Oncology | Admitting: Radiation Oncology

## 2019-06-28 ENCOUNTER — Other Ambulatory Visit: Payer: Self-pay

## 2019-06-28 DIAGNOSIS — C250 Malignant neoplasm of head of pancreas: Secondary | ICD-10-CM | POA: Insufficient documentation

## 2019-06-28 DIAGNOSIS — Z510 Encounter for antineoplastic radiation therapy: Secondary | ICD-10-CM | POA: Insufficient documentation

## 2019-06-29 ENCOUNTER — Ambulatory Visit: Payer: Medicare HMO | Admitting: Radiation Oncology

## 2019-06-30 ENCOUNTER — Other Ambulatory Visit: Payer: Self-pay

## 2019-06-30 ENCOUNTER — Ambulatory Visit
Admission: RE | Admit: 2019-06-30 | Discharge: 2019-06-30 | Disposition: A | Discharge status: Home / Self Care | Payer: Medicare HMO | Source: Ambulatory Visit | Attending: Radiation Oncology | Admitting: Radiation Oncology

## 2019-06-30 DIAGNOSIS — Z510 Encounter for antineoplastic radiation therapy: Secondary | ICD-10-CM | POA: Diagnosis not present

## 2019-07-01 ENCOUNTER — Ambulatory Visit: Payer: Medicare HMO | Admitting: Radiation Oncology

## 2019-07-04 ENCOUNTER — Other Ambulatory Visit: Payer: Self-pay

## 2019-07-04 ENCOUNTER — Ambulatory Visit: Payer: Medicare HMO | Admitting: Radiation Oncology

## 2019-07-04 ENCOUNTER — Ambulatory Visit
Admission: RE | Admit: 2019-07-04 | Discharge: 2019-07-04 | Disposition: A | Discharge status: Home / Self Care | Payer: Medicare HMO | Source: Ambulatory Visit | Attending: Radiation Oncology | Admitting: Radiation Oncology

## 2019-07-04 DIAGNOSIS — Z510 Encounter for antineoplastic radiation therapy: Secondary | ICD-10-CM | POA: Diagnosis not present

## 2019-07-05 ENCOUNTER — Ambulatory Visit: Payer: Medicare HMO | Admitting: Radiation Oncology

## 2019-07-06 ENCOUNTER — Other Ambulatory Visit: Payer: Self-pay

## 2019-07-06 ENCOUNTER — Ambulatory Visit: Payer: Medicare HMO | Admitting: Radiation Oncology

## 2019-07-06 ENCOUNTER — Ambulatory Visit
Admission: RE | Admit: 2019-07-06 | Discharge: 2019-07-06 | Disposition: A | Discharge status: Home / Self Care | Payer: Medicare HMO | Source: Ambulatory Visit | Attending: Radiation Oncology | Admitting: Radiation Oncology

## 2019-07-06 DIAGNOSIS — Z510 Encounter for antineoplastic radiation therapy: Secondary | ICD-10-CM | POA: Diagnosis not present

## 2019-07-06 NOTE — Progress Notes (Signed)
[name]   [phone]) [phone] [phone]) [phone]   Clinic Follow up Note   Patient Care Team: Nolene Ebbs, MD as PCP - General (Internal Medicine) Stark Klein, MD as Consulting Physician (General Surgery) Truitt Merle, MD as Consulting Physician (Hematology) Alla Feeling, NP as Nurse Practitioner (Nurse Practitioner) Arna Snipe, RN as Oncology Nurse Navigator  Date of Service:  07/08/2019  CHIEF COMPLAINT: F/u of pancreatic cancer  SUMMARY OF ONCOLOGIC HISTORY: Oncology History  Malignant neoplasm of head of pancreas ([place])  03/28/2019 Imaging   CT AP w contrast IMPRESSION: Interval development of 20 x 14 mm ill-defined low density in pancreatic head concerning for possible neoplasm or malignancy. This appears to be resulting in severe intrahepatic and extrahepatic biliary dilatation. ERCP is recommended for further evaluation.  Sigmoid diverticulosis without inflammation.   03/29/2019 Initial Biopsy   Diagnosis FINE NEEDLE ASPIRATION, ENDOSCOPIC EGUS, PANCREATIC HEAD MASS (SPECIMEN 1 OF 2 COLLECTED 03/29/2019) MALIGNANT CELLS CONSISTENT WITH ADENOCARCINOMA.   03/29/2019 Initial Biopsy   Diagnosis Duodenum, Biopsy, abnormal mucosa at major papilla - ADENOCARCINOMA. SEE NOTE Diagnosis Note The duodenal mucosa is negative for dysplasia. The findings are consistent with duodenal involvement from patient's suspected primary pancreatic adenocarcinoma. Dr. Melina Copa has reviewed this case and concurs with the above interpretation. Dr. Ardis Hughs was notified on 03/30/2019.   03/29/2019 Pathology Results   Diagnosis BILE DUCT BRUSHING (SPECIMEN 2 OF 2, COLLECTED ON 03/29/2019): HIGHLY SUSPICIOUS FOR MALIGNANCY.   03/29/2019 Procedure   ERCP with stenting of malignant distal CBD stricture per Dr. Ardis Hughs   04/04/2019 Initial Diagnosis   Malignant neoplasm of head of pancreas ([place])   04/08/2019 Imaging   CT Chest IMPRESSION: No evidence of metastatic disease in the  chest. Aortic Atherosclerosis (ICD10-I70.0).    04/13/2019 - 04/27/2019 Chemotherapy   neoadjuvant FOLFIRINOX q3weeks beginning 04/13/19; dose-reduced with cycle 1. Stopped afger 2 cycles due to poor toleration   05/20/2019 Imaging   CT AP W Contrast 05/20/19  05/20/19  IMPRESSION: 1. Mild increase in size of the infiltrative pancreatic head malignancy currently 1.9 by 1.6 cm, previously 1.7 by 1.2 cm by my measurements. No direct major vascular involvement or distant metastatic spread identified. The margin of the tumor is traversed by metallic stent, with resulting pneumobilia. Mildly increased dorsal pancreatic duct dilatation. 2. Diffuse hepatic steatosis. 3. Small hemangioma inferiorly in the right hepatic lobe, stable from 2007. 4. Mild wall thickening of the gallbladder, nonspecific. 5. 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 6. Other imaging findings of potential clinical significance: Sigmoid colon diverticulosis. Degenerative hip arthropathy bilaterally. Old healed sternal fracture. Stable wedge compression fracture at T12. Grade 1 anterolisthesis at L4-5.   06/08/2019 Genetic Testing   Positive genetic testing. 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 The Common Hereditary Cancers Panel offered by [employer] includes sequencing and/or deletion duplication testing of the following 48 genes: APC, ATM, AXIN2, BARD1, BMPR1A, BRCA1, BRCA2, BRIP1, CDH1, CDKN2A (p14ARF), CDKN2A (p16INK4a), CKD4, CHEK2, CTNNA1, DICER1, EPCAM (Deletion/duplication testing only), GREM1 (promoter region deletion/duplication testing only), KIT, MEN1, MLH1, MSH2, MSH3, MSH6, MUTYH, NBN, NF1, NHTL1, PALB2, PDGFRA, PMS2, POLD1, POLE, PTEN, RAD50, RAD51C, RAD51D, RNF43, SDHB, SDHC, SDHD, SMAD4, SMARCA4. STK11, TP53, TSC1, TSC2, and VHL.  The following genes were evaluated for sequence changes only: SDHA  and HOXB13 c.251G>A variant only. The report date is 06/08/2019.   06/09/2019 Procedure   EGD by Dr. Ardis Hughs 06/09/19  IMPRESSION - 4 fiducial markers  were placed into the pancreatic head mass via transduodenal EUS approach. - Duodenal edema and luminal narrowing in the distal duodenal bulb, likely from adjacent pancreatic mass. This is causing partial gastric outlet obstruction.   06/17/2019 Procedure   Upper Endoscopy by Dr. Carlean Purl 06/17/19  IMRPESSION - Acquired duodenal stenosis. SUSPECT FROM TUMOR AND MASS EFFECT - PARTIAL AND TIGHT - BILIARY STENT COULD BE CONTRIBUTING OR CAUSING THIS BY IRRITATING DUODENAL WALL BUT AT THIS POINT I DO NOT THINK SO - Slight fibrous food (residue) in the stomach. LARGELY CLEAR OF FLUID AND DEBRIS - The examination was otherwise normal. - No specimens collected.   06/28/2019 -  Radiation Therapy   SBRT 06/28/19-07/08/19      CURRENT THERAPY:  SBRT 06/28/19-07/08/19  INTERVAL HISTORY:  Julie Rollins is here for a follow up. She presents to the clinic alone. She notes she is still in rehab until 8/17. She has not been seen how to use her feeding tube although she needs to learn before discharge.  She brought her rehab information. She notes she is still on clear liquid diet with apple juice and ginger ale. She did eat lemon pudding once and was able to tolerate it.  She completed radiation today.    REVIEW OF SYSTEMS:   Constitutional: Denies fevers, chills or abnormal weight loss Eyes: Denies blurriness of vision Ears, nose, mouth, throat, and face: Denies mucositis or sore throat Respiratory: Denies cough, dyspnea or wheezes Cardiovascular: Denies palpitation, chest discomfort or lower extremity swelling Gastrointestinal:  Denies nausea, heartburn or change in bowel habits Skin: Denies abnormal skin rashes Lymphatics: Denies new lymphadenopathy or easy bruising Neurological:Denies numbness, tingling or new weaknesses Behavioral/Psych: Mood is  stable, no new changes  All other systems were reviewed with the patient and are negative.  MEDICAL HISTORY:  Past Medical History:  Diagnosis Date  . Anxiety   . Blood transfusion without reported diagnosis   . Breast CA ([place])    s/p lumpectomy and radiation 2000  . Breast cancer ([place])   . Colitis    2007  . Dyspnea   . Dysrhythmia   . Family history of bladder cancer   . Family history of stomach cancer   . Family history of throat cancer   . Hernia, epigastric   . HTN (hypertension)    "mild hypertension", was on BP med years ago but it caused orthostatic hypotension and she has not been medicated since  . Hypothyroid   . Pancreatic cancer ([place]) dx'd 03/2019  . Personal history of breast cancer   . Personal history of radiation therapy 2001  . Poor appetite 04/2019  . Vertigo   . Vertigo     SURGICAL HISTORY: Past Surgical History:  Procedure Laterality Date  . ABDOMINAL HYSTERECTOMY     61yr ago  . ANTERIOR CERVICAL DECOMP/DISCECTOMY FUSION N/A 08/02/2018   Procedure: ANTERIOR CERVICAL DECOMPRESSION/DISCECTOMY FUSION CERVICAL FIVE- CERVICAL SIX;  Surgeon: PEarnie Larsson MD;  [facility]  Service: Neurosurgery;  Laterality: N/A;  ANTERIOR CERVICAL DECOMPRESSION/DISCECTOMY FUSION CERVICAL FIVE- CERVICAL SIX  . BILIARY BRUSHING  03/29/2019   Procedure: BILIARY BRUSHING;  Surgeon: JMilus Banister MD;  [facility];  Service: Endoscopy;;  . BILIARY STENT PLACEMENT  03/29/2019   Procedure: BILIARY STENT PLACEMENT;  Surgeon: JMilus Banister MD;  [facility];  Service: Endoscopy;;  . BIOPSY  03/29/2019   Procedure: BIOPSY;  Surgeon: JMilus Banister MD;  [facility]  Service: Endoscopy;;  . BREAST  LUMPECTOMY     2000  . ERCP N/A 03/29/2019   Procedure: ENDOSCOPIC RETROGRADE CHOLANGIOPANCREATOGRAPHY (ERCP);  Surgeon: [name], MD;  [facility];  Service: Endoscopy;  Laterality: N/A;  . ESOPHAGOGASTRODUODENOSCOPY N/A 06/17/2019    Procedure: ESOPHAGOGASTRODUODENOSCOPY (EGD);  Surgeon: Gatha Mayer, MD;  Location: [facility];  Service: Gastroenterology;  Laterality: N/A;  . ESOPHAGOGASTRODUODENOSCOPY (EGD) WITH PROPOFOL N/A 03/29/2019   Procedure: ESOPHAGOGASTRODUODENOSCOPY (EGD) WITH PROPOFOL;  Surgeon: [name], MD;  [facility];  Service: Endoscopy;  Laterality: N/A;  . ESOPHAGOGASTRODUODENOSCOPY (EGD) WITH PROPOFOL N/A 06/09/2019   Procedure: ESOPHAGOGASTRODUODENOSCOPY (EGD) WITH PROPOFOL;  Surgeon: [name], MD;  Location: [facility] ENDOSCOPY;  Service: Endoscopy;  Laterality: N/A;  . EUS N/A 03/29/2019   Procedure: UPPER ENDOSCOPIC ULTRASOUND (EUS) LINEAR;  Surgeon: [name], MD;  [facility];  Service: Endoscopy;  Laterality: N/A;  . EUS N/A 06/09/2019   Procedure: UPPER ENDOSCOPIC ULTRASOUND (EUS) LINEAR;  Surgeon: [name], MD;  Location: [facility] ENDOSCOPY;  Service: Endoscopy;  Laterality: N/A;  . FIDUCIAL MARKER PLACEMENT  06/09/2019   Procedure: FIDUCIAL MARKER PLACEMENT;  Surgeon: [name], MD;  Location: [facility] ENDOSCOPY;  Service: Endoscopy;;  . FINE NEEDLE ASPIRATION  03/29/2019   Procedure: FINE NEEDLE ASPIRATION (FNA);  Surgeon: [name], MD;  Location: [facility] END[facility]COPY;  Service: Endoscopy;;  . HERNIA REPAIR     20+ years ago  . PORTACATH PLACEMENT N/A 04/12/2019   Procedure: INSERTION PORT-A-CATH LEFT SUBCLAVIAN;  Surgeon: Stark Klein, MD;  Location: [place];  Service: General;  Laterality: N/A;  . RETINAL DETACHMENT SURGERY    . SPHINCTEROTOMY  03/29/2019   Procedure: SPHINCTEROTOMY;  Surgeon: [name], MD;  Location[facility] END[facility]COPY;  Service: Endoscopy;;    I have reviewed the social history and family history with the patient and they are unchanged from previous note.  ALLERGIES:  is allergic to codeine.  MEDICATIONS:  Current Outpatient Medications  Medication Sig Dispense Refill  . diltiazem (CARDIZEM CD) 180 MG 24 hr  capsule Take 180 mg by mouth daily.    . hydrocortisone (ANUSOL-HC) 2.5 % rectal cream Place rectally 2 (two) times daily.    Marland Kitchen levothyroxine (SYNTHROID, LEVOTHROID) 50 MCG tablet Take 50 mcg by mouth every morning.    . metoCLOPramide (REGLAN) 5 MG tablet Take 1 tablet (5 mg total) by mouth 4 (four) times daily -  before meals and at bedtime.    . mirtazapine (REMERON) 7.5 MG tablet Take 1 tablet (7.5 mg total) by mouth at bedtime. 30 tablet 1  . olaparib (LYNPARZA) 100 MG tablet Take 1 tablet (100 mg total) by mouth 2 (two) times daily. Swallow whole. May take with food to decrease nausea and vomiting. 60 tablet 0  . ondansetron (ZOFRAN-ODT) 8 MG disintegrating tablet Take 1 tablet (8 mg total) by mouth every 8 (eight) hours as needed for nausea or vomiting. 20 tablet 0  . pantoprazole (PROTONIX) 40 MG tablet Take 1 tablet (40 mg total) by mouth 2 (two) times daily.     No current facility-administered medications for this visit.     PHYSICAL EXAMINATION: ECOG PERFORMANCE STATUS: 2 - Symptomatic, <50% confined to bed  There were no vitals filed for this visit. There were no vitals filed for this visit.  GENERAL:alert, no distress and comfortable SKIN: skin color, texture, turgor are normal, no rashes or significant lesions (+) feeding tube through port  EYES: normal, Conjunctiva are pink and non-injected,  sclera clear  NECK: supple, thyroid normal size, non-tender, without nodularity LYMPH:  no palpable lymphadenopathy in the cervical, axillary  LUNGS: clear to auscultation and percussion with normal breathing effort HEART: regular rate & rhythm and no murmurs and no lower extremity edema ABDOMEN:abdomen soft, non-tender and normal bowel sounds Musculoskeletal:no cyanosis of digits and no clubbing  NEURO: alert & oriented x 3 with fluent speech, no focal motor/sensory deficits  LABORATORY DATA:  I have reviewed the data as listed CBC Latest Ref Rng & Units 06/20/2019 06/17/2019  06/16/2019  WBC 4.0 - 10.5 K/uL 6.2 4.1 4.8  Hemoglobin 12.0 - 15.0 g/dL 11.0(L) 10.7(L) 10.1(L)  Hematocrit 36.0 - 46.0 % 35.3(L) 34.4(L) 31.7(L)  Platelets 150 - 400 K/uL 231 233 209     CMP Latest Ref Rng & Units 06/23/2019 06/22/2019 06/21/2019  Glucose 70 - 99 mg/dL 192(H) 158(H) 150(H)  BUN 8 - 23 mg/dL 24(H) 21 23  Creatinine 0.44 - 1.00 mg/dL 0.44 0.40(L) 0.44  Sodium 135 - 145 mmol/L 139 139 139  Potassium 3.5 - 5.1 mmol/L 4.9 4.2 4.0  Chloride 98 - 111 mmol/L 106 106 107  CO2 22 - 32 mmol/L _0 Calcium 8.9 - 10.3 mg/dL 9.1 8.8(L) 8.8(L)  Total Protein 6.5 - 8.1 g/dL 6.0(L) - 5.4(L)  Total Bilirubin 0.3 - 1.2 mg/dL 0.3 - 0.5  Alkaline Phos 38 - 126 U/L 112 - 90  AST 15 - 41 U/L 24 - 21  ALT 0 - 44 U/L 29 - 28      RADIOGRAPHIC STUDIES: I have personally reviewed the radiological images as listed and agreed with the findings in the report. No results found.   ASSESSMENT & PLAN:  Julie Rollins is a 73 y.o. female with   1. Adenocarcinoma of pancreatic head, cT2N0M0, with duodenal involvement  -Diagnosed 03/2019, presented with painless juandice s/p ERCP with stenting (bili 23.9).  -She started neoadjuvant FOLFIRINOX on 04/13/19. S/p 2 cycles. Chemo held due to persistent nausea and poor appetite  -she unfortunately developed duodenal obstruction due to the pancreatic cancer, was hospitalized, and started TPN in the hospital.  -She started SBRT on 06/28/19 and completed on 07/08/19.  -She is currently in nursing home, will be discharged home next Monday. -I also reviewed her recent genetic test results, which was positive for BRCA2 mutation. Given her limited treatment options I recommend starting her on oral PARP2 inhibitor Olaparib with her, potential benefit and side effects discussed with her, especially fatigue, low blood counts, risk of infection, need for blood transfusion, etc. she is interested. Plan to start next week  -F/u in 2 weeks   2. Partial Gastric  Outlet Obstruction  -Due to worsening N&V she was hospitalized on 06/13/19. She had partial gastric outlet obstruction otherwise upper GI workup negative. She had TPN placed, on liquid diet.  -She is currently in rehab until 8/17. She plans to start tube feeding upon discharge. Will contact Dr. Barry Dienes about how long she should be on tube feeding.  -She completed SBRT today (07/08/19). She is still on clear liquid diet. She may try Ensure/Carnation or pudding foods if tolerable.   3. 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 Ardis Hughs on 03/29/19  -Her jaundice has resolved  4. Persistent nausea, vomiting, diarrhea, and anorexia -She had these symptoms before chemo, got much worse after chemo -Since recent hospitalization for gastric outlet  obstruction her symptoms are now manageable. Will monitor as this can indicate another obstruction.   5.BRCA2 + mutation -Has personal history of breast cancer (DCIS) and pancreas cancer. 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  -She is eligible for PARP2 inhibitor Olaparib which she will start next week.   6. H/o left breast DCIS -diagnosed in 2000, s/p lumpectomy and adjuvant radiation, managed by Dr. Margot Chimes -last mammo in 01/2018 negative for evidence of malignancy   7. Hypothyroidism -on Synthroid per PCP  8. Transaminitis -Her initial transaminitis and jaundice most resolved after stent placement -Persistent moderate transaminitis after chemo, total bilirubin normal, will continue monitoring -No liver metastasis on recent scan.  9. Limited social support  -She lives alone, has very limited social support -Our cancer center has been offering transportation to support her  PLAN: -she will be discharged from rehab  to home next Monday -She will continue TPN -Start oral Olaparib next week when she gets home -Lab and f/u in 2 weeks    No problem-specific Assessment & Plan notes found for this encounter.   No orders of the defined types were placed in this encounter.  All questions were answered. The patient knows to call the clinic with any problems, questions or concerns. No barriers to learning was detected. I spent 20 minutes counseling the patient face to face. The total time spent in the appointment was 25 minutes and more than 50% was on counseling and review of test results     Truitt Merle, MD 07/08/2019   I, Joslyn Devon, am acting as scribe for Truitt Merle, MD.   I have reviewed the above documentation for accuracy and completeness, and I agree with the above.

## 2019-07-07 ENCOUNTER — Ambulatory Visit: Payer: Medicare HMO | Admitting: Radiation Oncology

## 2019-07-08 ENCOUNTER — Inpatient Hospital Stay: Payer: Medicare HMO

## 2019-07-08 ENCOUNTER — Encounter: Payer: Self-pay | Admitting: Hematology

## 2019-07-08 ENCOUNTER — Ambulatory Visit
Admission: RE | Admit: 2019-07-08 | Discharge: 2019-07-08 | Disposition: A | Discharge status: Home / Self Care | Payer: Medicare HMO | Source: Ambulatory Visit | Attending: Radiation Oncology | Admitting: Radiation Oncology

## 2019-07-08 ENCOUNTER — Inpatient Hospital Stay (HOSPITAL_BASED_OUTPATIENT_CLINIC_OR_DEPARTMENT_OTHER): Payer: Medicare HMO | Admitting: Hematology

## 2019-07-08 ENCOUNTER — Telehealth: Payer: Self-pay | Admitting: Hematology

## 2019-07-08 ENCOUNTER — Encounter: Payer: Self-pay | Admitting: Radiation Oncology

## 2019-07-08 ENCOUNTER — Other Ambulatory Visit: Payer: Self-pay

## 2019-07-08 DIAGNOSIS — C250 Malignant neoplasm of head of pancreas: Secondary | ICD-10-CM

## 2019-07-08 DIAGNOSIS — Z510 Encounter for antineoplastic radiation therapy: Secondary | ICD-10-CM | POA: Diagnosis not present

## 2019-07-08 DIAGNOSIS — E440 Moderate protein-calorie malnutrition: Secondary | ICD-10-CM

## 2019-07-08 DIAGNOSIS — K311 Adult hypertrophic pyloric stenosis: Secondary | ICD-10-CM | POA: Diagnosis not present

## 2019-07-08 NOTE — Telephone Encounter (Signed)
Scheduled appt per 8/14 los.  Spoke with patient and she is aware of her appt date and time.

## 2019-07-11 ENCOUNTER — Telehealth: Payer: Self-pay | Admitting: *Deleted

## 2019-07-11 NOTE — Telephone Encounter (Signed)
Received vm call from pt stating that she doesn't think she can learn tube feeding. Called pt & she is very anxious & states that she is eating some pudding & drank an Ensure this am & it stayed down & she wants to try some carnation instant breakfast.  She states bowels are moving & soft.  Nurse is there from [facility]. Pt doesn't think she can add medications to bag.  Talked with Nurse & pt will need to add Vitamins to TPN bag that will go through her port daily.  Discussed with [facility] & she states vitamins are not stable & all pts have to add at home daily & that is why pharmacy can't add.  She suggested if pt is willing to try to learn, pharmacy can spike bag & she learn how to prime & connect & if Dr [name] or Dr Barry Dienes agree can add vitamins two times weekly & nurse go out two times weekly to add vitamins & assist.  She is in hopes that [facility] is able to have several visits with pt to teach.  Talked with pt & Nurse/Deana & pt states willing to try b/c Dr [name] states that she needs to gain some weight & get built up for surgery, otherwise she'll never get to surgery.  Informed that Carolynn Sayers RN would like to have a conference call in am with [facility] & supervisor & phamacy in am to see where we stand.  She expressed understanding in trying to take baby steps with pt.

## 2019-07-13 ENCOUNTER — Telehealth: Payer: Self-pay

## 2019-07-13 ENCOUNTER — Telehealth: Payer: Self-pay | Admitting: Pharmacist

## 2019-07-13 NOTE — Telephone Encounter (Signed)
Oral Chemotherapy Pharmacist Encounter   I spoke with patient today for overview of: Lynparza (olaparib) for the treatment of early stage pancreatic adenocarcinoma, may not be a candidate for resection, BRCA2 mutation positive, planned duration until disease progression or unacceptable toxicity.   Counseled patient on administration, dosing, side effects, monitoring, drug-food interactions, safe handling, storage, and disposal.  Patient will take Lynparza 119m tablets, 1 tablet (1074m by mouth 2 times daily without regard to food. Patient informed that dose has been reduced for increased tolerability. Her dose may increase in the future based on benefit and toleration.  Patient counseled that administering with food may increase tolerability.  Patient knows to avoid grapefruit or grapefruit juice while on therapy with Lynparza.  LyLonie Peaktart date: 07/16/2019   Adverse effects include but are not limited to: nausea, vomiting, diarrhea, taste changes, mouth sores, fatigue, constipation, decreased blood counts, and joint pain. Patient informed that pneumonitis (including some fatalities) has occurred rarely. Myelodysplastic syndrome/acute myeloid leukemia (MDS/AML) have been reported (rarely) in clinical trials.  Patient has anti-emetic on hand and knows to take it if nausea develops.   Patient will obtain anti diarrheal and alert the office of 4 or more loose stools above baseline. She tends towards loose bowels already.  Reviewed with patient importance of keeping a medication schedule and plan for any missed doses.  Medication reconciliation performed and medication/allergy list updated.  Insurance authorization for LyLonie Peakas been obtained. Test claim at the pharmacy revealed copayment $8.95 for 1st fill of Lynparza. This will [place] from the [facility] [place] 07/14/19 to deliver to patient's home on 07/15/19.  Patient informed the pharmacy will reach out 5-7 days  prior to needing next fill of Lynparza to coordinate continued medication acquisition to prevent break in therapy.  All questions answered.  Ms. HoMakepeaceoiced understanding and appreciation.   Patient knows to call the office with questions or concerns.  JeJohny DrillingPharmD, BCPS, BCOP  07/13/2019   9:13 AM Oral Oncology Clinic [phone]

## 2019-07-13 NOTE — Telephone Encounter (Signed)
Nutrition Follow-up:  Patient with pancreatic cancer with duodenal involvement.  Patient has been in the hospital with partial gastric outlet obstruction.  Hospital notes reviewed.  Noted patient was discharged to rehab on TPN and liquid diet.  Noted per hospital notes not planning J-tube placement until whipple surgery.   Spoke with patient via phone this am.  Patient reports that she got out of rehab on 8/17.   Reports that she feels weak and has not been able to add the vitamins to the "tube feeding".  Asked patient if she has a tube in stomach or lower intestines and she reports no.  Reports that she has "the nutrition going in her port."  "I don't know what this stuff is called."  Reports that home health nursing is coming out and helping and will be coming to see her at 61 today to help teach her more about the nutrition.  Reports that she is eating liquids orally and has tried some pudding and carnation breakfast essentials and ensure at the direction of Dr. [name] and it has gone well.  Reports that physical therapy will be coming on Thursday.  Patient anxious and reports that she can't do this by herself.  "I don't have anybody that lives with me."    Patient reports that she will start new chemo pill on Saturday.  She does not know when she will be having surgery yet.   Medications: reviewed  Labs: reviewed from 7/30  Anthropometrics:   Weight from 7/30 113 lb 15.7 oz decreased from 120 lb on 6/29   NUTRITION DIAGNOSIS: Inadequate oral intake continues relying on TPN with GOO   INTERVENTION:  Called and spoke with Carolynn Sayers, RN with [facility] to clarify that patient does not have feeding tube currently and only receiving TPN and liquids orally.  Pam confirmed this and home health nursing is teaching patient how to do TPN.  Pam reports that patient will be taking chewable MVI vs drawing up MVI to place in TPN, per [place] pharmacist recommendations.   Message  sent to Dr Barry Dienes as well regarding nutrition plan of care.  Discussed briefly with patient the difference between TPN/TNA and tube feeding.   Patient to continue liquids orally under the direction of MD. Continue to monitor patient's progress and plan of care Patient has contact information     MONITORING, EVALUATION, GOAL: Patient will consume adequate calories and protein to maintain weight   NEXT VISIT: to be determined  Julie Rollins, [place], [place] Registered Dietitian [phone] (pager)

## 2019-07-14 ENCOUNTER — Emergency Department (HOSPITAL_COMMUNITY): Payer: Medicare HMO

## 2019-07-14 ENCOUNTER — Encounter (HOSPITAL_COMMUNITY): Payer: Self-pay

## 2019-07-14 ENCOUNTER — Telehealth: Payer: Self-pay | Admitting: *Deleted

## 2019-07-14 ENCOUNTER — Inpatient Hospital Stay (HOSPITAL_COMMUNITY)
Admission: EM | Admit: 2019-07-14 | Discharge: 2019-07-21 | DRG: 871 | Disposition: A | Discharge status: Home Health | Payer: Medicare HMO | Attending: Internal Medicine | Admitting: Internal Medicine

## 2019-07-14 ENCOUNTER — Other Ambulatory Visit: Payer: Self-pay

## 2019-07-14 DIAGNOSIS — Z808 Family history of malignant neoplasm of other organs or systems: Secondary | ICD-10-CM | POA: Diagnosis not present

## 2019-07-14 DIAGNOSIS — A419 Sepsis, unspecified organism: Secondary | ICD-10-CM

## 2019-07-14 DIAGNOSIS — D630 Anemia in neoplastic disease: Secondary | ICD-10-CM | POA: Diagnosis present

## 2019-07-14 DIAGNOSIS — Z09 Encounter for follow-up examination after completed treatment for conditions other than malignant neoplasm: Secondary | ICD-10-CM

## 2019-07-14 DIAGNOSIS — I10 Essential (primary) hypertension: Secondary | ICD-10-CM | POA: Diagnosis present

## 2019-07-14 DIAGNOSIS — T82594A Other mechanical complication of infusion catheter, initial encounter: Principal | ICD-10-CM | POA: Diagnosis present

## 2019-07-14 DIAGNOSIS — Z90710 Acquired absence of both cervix and uterus: Secondary | ICD-10-CM

## 2019-07-14 DIAGNOSIS — Z452 Encounter for adjustment and management of vascular access device: Secondary | ICD-10-CM

## 2019-07-14 DIAGNOSIS — C250 Malignant neoplasm of head of pancreas: Secondary | ICD-10-CM | POA: Diagnosis present

## 2019-07-14 DIAGNOSIS — Z9221 Personal history of antineoplastic chemotherapy: Secondary | ICD-10-CM | POA: Diagnosis not present

## 2019-07-14 DIAGNOSIS — Y838 Other surgical procedures as the cause of abnormal reaction of the patient, or of later complication, without mention of misadventure at the time of the procedure: Secondary | ICD-10-CM | POA: Diagnosis present

## 2019-07-14 DIAGNOSIS — Y828 Other medical devices associated with adverse incidents: Secondary | ICD-10-CM | POA: Diagnosis present

## 2019-07-14 DIAGNOSIS — Z853 Personal history of malignant neoplasm of breast: Secondary | ICD-10-CM

## 2019-07-14 DIAGNOSIS — J189 Pneumonia, unspecified organism: Secondary | ICD-10-CM | POA: Diagnosis present

## 2019-07-14 DIAGNOSIS — C259 Malignant neoplasm of pancreas, unspecified: Secondary | ICD-10-CM | POA: Diagnosis present

## 2019-07-14 DIAGNOSIS — E039 Hypothyroidism, unspecified: Secondary | ICD-10-CM | POA: Diagnosis present

## 2019-07-14 DIAGNOSIS — R7881 Bacteremia: Secondary | ICD-10-CM | POA: Diagnosis not present

## 2019-07-14 DIAGNOSIS — E876 Hypokalemia: Secondary | ICD-10-CM | POA: Diagnosis not present

## 2019-07-14 DIAGNOSIS — E871 Hypo-osmolality and hyponatremia: Secondary | ICD-10-CM | POA: Diagnosis present

## 2019-07-14 DIAGNOSIS — Z6821 Body mass index (BMI) 21.0-21.9, adult: Secondary | ICD-10-CM

## 2019-07-14 DIAGNOSIS — E860 Dehydration: Secondary | ICD-10-CM | POA: Diagnosis present

## 2019-07-14 DIAGNOSIS — Z800 Family history of malignant neoplasm of digestive organs: Secondary | ICD-10-CM

## 2019-07-14 DIAGNOSIS — E46 Unspecified protein-calorie malnutrition: Secondary | ICD-10-CM | POA: Diagnosis present

## 2019-07-14 DIAGNOSIS — K315 Obstruction of duodenum: Secondary | ICD-10-CM | POA: Diagnosis present

## 2019-07-14 DIAGNOSIS — A411 Sepsis due to other specified staphylococcus: Secondary | ICD-10-CM | POA: Diagnosis present

## 2019-07-14 DIAGNOSIS — K311 Adult hypertrophic pyloric stenosis: Secondary | ICD-10-CM

## 2019-07-14 DIAGNOSIS — R509 Fever, unspecified: Secondary | ICD-10-CM

## 2019-07-14 DIAGNOSIS — Z8052 Family history of malignant neoplasm of bladder: Secondary | ICD-10-CM

## 2019-07-14 DIAGNOSIS — Z20828 Contact with and (suspected) exposure to other viral communicable diseases: Secondary | ICD-10-CM | POA: Diagnosis present

## 2019-07-14 DIAGNOSIS — Z923 Personal history of irradiation: Secondary | ICD-10-CM | POA: Diagnosis not present

## 2019-07-14 LAB — CBC WITH DIFFERENTIAL/PLATELET
Abs Immature Granulocytes: 0.03 10*3/uL (ref 0.00–0.07)
Basophils Absolute: 0 10*3/uL (ref 0.0–0.1)
Basophils Relative: 0 %
Eosinophils Absolute: 0 10*3/uL (ref 0.0–0.5)
Eosinophils Relative: 0 %
HCT: 33.2 % — ABNORMAL LOW (ref 36.0–46.0)
Hemoglobin: 10.9 g/dL — ABNORMAL LOW (ref 12.0–15.0)
Immature Granulocytes: 1 %
Lymphocytes Relative: 5 %
Lymphs Abs: 0.3 10*3/uL — ABNORMAL LOW (ref 0.7–4.0)
MCH: 31.1 pg (ref 26.0–34.0)
MCHC: 32.8 g/dL (ref 30.0–36.0)
MCV: 94.6 fL (ref 80.0–100.0)
Monocytes Absolute: 0.6 10*3/uL (ref 0.1–1.0)
Monocytes Relative: 10 %
Neutro Abs: 4.7 10*3/uL (ref 1.7–7.7)
Neutrophils Relative %: 84 %
Platelets: 343 10*3/uL (ref 150–400)
RBC: 3.51 MIL/uL — ABNORMAL LOW (ref 3.87–5.11)
RDW: 14.5 % (ref 11.5–15.5)
WBC: 5.6 10*3/uL (ref 4.0–10.5)
nRBC: 0 % (ref 0.0–0.2)

## 2019-07-14 LAB — URINALYSIS, ROUTINE W REFLEX MICROSCOPIC
Bilirubin Urine: NEGATIVE
Glucose, UA: NEGATIVE mg/dL
Hgb urine dipstick: NEGATIVE
Ketones, ur: NEGATIVE mg/dL
Leukocytes,Ua: NEGATIVE
Nitrite: NEGATIVE
Protein, ur: NEGATIVE mg/dL
Specific Gravity, Urine: 1.009 (ref 1.005–1.030)
pH: 8 (ref 5.0–8.0)

## 2019-07-14 LAB — COMPREHENSIVE METABOLIC PANEL
ALT: 20 U/L (ref 0–44)
AST: 45 U/L — ABNORMAL HIGH (ref 15–41)
Albumin: 2.2 g/dL — ABNORMAL LOW (ref 3.5–5.0)
Alkaline Phosphatase: 102 U/L (ref 38–126)
Anion gap: 8 (ref 5–15)
BUN: 16 mg/dL (ref 8–23)
CO2: 22 mmol/L (ref 22–32)
Calcium: 7.7 mg/dL — ABNORMAL LOW (ref 8.9–10.3)
Chloride: 103 mmol/L (ref 98–111)
Creatinine, Ser: 0.52 mg/dL (ref 0.44–1.00)
GFR calc Af Amer: >60 mL/min (ref >60)
GFR calc non Af Amer: >60 mL/min (ref >60)
Glucose, Bld: 151 mg/dL — ABNORMAL HIGH (ref 70–99)
Potassium: 4.7 mmol/L (ref 3.5–5.1)
Sodium: 133 mmol/L — ABNORMAL LOW (ref 135–145)
Total Bilirubin: 1.5 mg/dL — ABNORMAL HIGH (ref 0.3–1.2)
Total Protein: 5.4 g/dL — ABNORMAL LOW (ref 6.5–8.1)

## 2019-07-14 LAB — SARS CORONAVIRUS 2 BY RT PCR (HOSPITAL ORDER, PERFORMED IN ~~LOC~~ HOSPITAL LAB): SARS Coronavirus 2: NEGATIVE

## 2019-07-14 LAB — LIPASE, BLOOD: Lipase: 45 U/L (ref 11–51)

## 2019-07-14 LAB — LACTIC ACID, PLASMA
Lactic Acid, Venous: 2.3 mmol/L (ref 0.5–1.9)
Lactic Acid, Venous: 1.5 mmol/L (ref 0.5–1.9)

## 2019-07-14 MED ORDER — ENOXAPARIN SODIUM 40 MG/0.4ML ~~LOC~~ SOLN
40.0000 mg | Freq: Every day | SUBCUTANEOUS | Status: DC
  Administered 2019-07-14 – 2019-07-20 (×6): 40 mg via SUBCUTANEOUS
  Filled 2019-07-14 (×7): qty 0.4

## 2019-07-14 MED ORDER — SODIUM CHLORIDE 0.9 % IV SOLN
500.0000 mg | INTRAVENOUS | Status: DC
  Administered 2019-07-14 – 2019-07-15 (×2): 500 mg via INTRAVENOUS
  Filled 2019-07-14 (×3): qty 500

## 2019-07-14 MED ORDER — SODIUM CHLORIDE 0.9 % IV BOLUS
1000.0000 mL | Freq: Once | INTRAVENOUS | Status: AC
  Administered 2019-07-14: 1000 mL via INTRAVENOUS

## 2019-07-14 MED ORDER — ADULT MULTIVITAMIN W/MINERALS CH
1.0000 | ORAL_TABLET | Freq: Every day | ORAL | Status: DC
  Administered 2019-07-15 – 2019-07-19 (×5): 1 via ORAL
  Filled 2019-07-14 (×7): qty 1

## 2019-07-14 MED ORDER — HYDROCORTISONE (PERIANAL) 2.5 % EX CREA
TOPICAL_CREAM | Freq: Two times a day (BID) | CUTANEOUS | Status: DC
  Administered 2019-07-14 – 2019-07-18 (×2): via RECTAL
  Filled 2019-07-14: qty 28.35

## 2019-07-14 MED ORDER — LEVOTHYROXINE SODIUM 50 MCG PO TABS
50.0000 ug | ORAL_TABLET | Freq: Every day | ORAL | Status: DC
  Administered 2019-07-15 – 2019-07-21 (×7): 50 ug via ORAL
  Filled 2019-07-14 (×7): qty 1

## 2019-07-14 MED ORDER — DILTIAZEM HCL ER COATED BEADS 180 MG PO CP24
180.0000 mg | ORAL_CAPSULE | Freq: Every day | ORAL | Status: DC
  Administered 2019-07-15 – 2019-07-21 (×7): 180 mg via ORAL
  Filled 2019-07-14 (×7): qty 1

## 2019-07-14 MED ORDER — SODIUM CHLORIDE 0.9 % IV SOLN
1.0000 g | INTRAVENOUS | Status: DC
  Administered 2019-07-15 (×2): 1 g via INTRAVENOUS
  Filled 2019-07-14: qty 10
  Filled 2019-07-14: qty 1
  Filled 2019-07-14 (×2): qty 10
  Filled 2019-07-14: qty 1

## 2019-07-14 MED ORDER — METOCLOPRAMIDE HCL 5 MG PO TABS
5.0000 mg | ORAL_TABLET | Freq: Three times a day (TID) | ORAL | Status: DC
  Administered 2019-07-14 – 2019-07-18 (×9): 5 mg via ORAL
  Filled 2019-07-14 (×18): qty 1

## 2019-07-14 MED ORDER — LORAZEPAM 1 MG PO TABS
1.0000 mg | ORAL_TABLET | Freq: Once | ORAL | Status: AC
  Administered 2019-07-14: 23:00:00 1 mg via ORAL
  Filled 2019-07-14: qty 1

## 2019-07-14 MED ORDER — PANTOPRAZOLE SODIUM 40 MG PO TBEC
40.0000 mg | DELAYED_RELEASE_TABLET | Freq: Two times a day (BID) | ORAL | Status: DC
  Administered 2019-07-14 – 2019-07-21 (×14): 40 mg via ORAL
  Filled 2019-07-14 (×14): qty 1

## 2019-07-14 MED ORDER — SODIUM CHLORIDE 0.9 % IV SOLN
1.0000 g | Freq: Once | INTRAVENOUS | Status: AC
  Administered 2019-07-14: 1 g via INTRAVENOUS
  Filled 2019-07-14: qty 10

## 2019-07-14 MED ORDER — MIRTAZAPINE 15 MG PO TABS
7.5000 mg | ORAL_TABLET | Freq: Every day | ORAL | Status: DC
  Administered 2019-07-14 – 2019-07-20 (×7): 7.5 mg via ORAL
  Filled 2019-07-14 (×7): qty 1

## 2019-07-14 MED ORDER — ONDANSETRON 4 MG PO TBDP: 8.0000 mg | ORAL_TABLET | Freq: Three times a day (TID) | ORAL | Status: DC | PRN

## 2019-07-14 MED ORDER — SODIUM CHLORIDE 0.9 % IV SOLN
INTRAVENOUS | Status: AC
  Administered 2019-07-15 (×2): via INTRAVENOUS

## 2019-07-14 MED ORDER — SODIUM CHLORIDE 0.9 % IV SOLN
500.0000 mg | Freq: Once | INTRAVENOUS | Status: AC
  Administered 2019-07-14: 500 mg via INTRAVENOUS
  Filled 2019-07-14: qty 500

## 2019-07-14 MED ORDER — ALTEPLASE 2 MG IJ SOLR
2.0000 mg | Freq: Once | INTRAMUSCULAR | Status: AC
  Administered 2019-07-14: 2 mg
  Filled 2019-07-14: qty 2

## 2019-07-14 MED FILL — LYNPARZA 100 MG TABS: 100 | 30 days supply | Qty: 60 | Fill #0

## 2019-07-14 NOTE — ED Notes (Signed)
Unable to obtain culture off accessed port. No blood return, IV team nurse consulted.

## 2019-07-14 NOTE — ED Provider Notes (Signed)
[facility] DEPT Provider Note   CSN: [id number] Arrival date & time: 07/14/19  1400     History   Chief Complaint Chief Complaint  Patient presents with  . Fever    cancer patient     HPI Julie Rollins is a 73 y.o. female.     HPI Patient presents with fever.  Began last night.  Temperature up to 102.  Has port on her left chest.  States she thinks it is been accessed longer than was supposed to.  No cough.  No shortness of breath.  No fever.  Has pancreatic cancer but not currently on chemotherapy.  No abdominal pain.  No dysuria.  No rash.  No known sick contacts with states she has been at [employer]. Past Medical History:  Diagnosis Date  . Anxiety   . Blood transfusion without reported diagnosis   . Breast CA ([place])    s/p lumpectomy and radiation 2000  . Breast cancer ([place])   . Colitis    2007  . Dyspnea   . Dysrhythmia   . Family history of bladder cancer   . Family history of stomach cancer   . Family history of throat cancer   . Hernia, epigastric   . HTN (hypertension)    "mild hypertension", was on BP med years ago but it caused orthostatic hypotension and she has not been medicated since  . Hypothyroid   . Pancreatic cancer ([place]) dx'd 03/2019  . Personal history of breast cancer   . Personal history of radiation therapy 2001  . Poor appetite 04/2019  . Vertigo   . Vertigo     Patient Active Problem List   Diagnosis Date Noted  . Malnutrition of moderate degree 06/22/2019  . Encounter for nasogastric (NG) tube placement   . Partial gastric outlet obstruction   . Intractable nausea and vomiting 06/13/2019  . Genetic testing 06/08/2019  . BRCA2 gene mutation positive in female 06/08/2019  . Family history of bladder cancer   . Family history of stomach cancer   . Family history of throat cancer   . Personal history of breast cancer   . Port-A-Cath in place 04/27/2019  . Malignant neoplasm of head of pancreas ([place])  04/04/2019  . Pancreatic mass 03/28/2019  . Elevated LFTs 03/28/2019  . Hypothyroidism 03/28/2019  . Diarrhea 03/28/2019  . Generalized weakness   . Jaundice   . Abnormal finding on GI tract imaging   . Elevated alkaline phosphatase level   . Cervical spine fracture ([place]) 07/31/2018    Past Surgical History:  Procedure Laterality Date  . ABDOMINAL HYSTERECTOMY     77yr ago  . ANTERIOR CERVICAL DECOMP/DISCECTOMY FUSION N/A 08/02/2018   Procedure: ANTERIOR CERVICAL DECOMPRESSION/DISCECTOMY FUSION CERVICAL FIVE- CERVICAL SIX;  Surgeon: PEarnie Larsson MD;  [facility]  Service: Neurosurgery;  Laterality: N/A;  ANTERIOR CERVICAL DECOMPRESSION/DISCECTOMY FUSION CERVICAL FIVE- CERVICAL SIX  . BILIARY BRUSHING  03/29/2019   Procedure: BILIARY BRUSHING;  Surgeon: JMilus Banister MD;  [facility]  Service: Endoscopy;;  . BILIARY STENT PLACEMENT  03/29/2019   Procedure: BILIARY STENT PLACEMENT;  Surgeon: JMilus Banister MD;  [facility]  Service: Endoscopy;;  . BIOPSY  03/29/2019   Procedure: BIOPSY;  Surgeon: JMilus Banister MD;  [facility];  Service: Endoscopy;;  . BREAST LUMPECTOMY     2000  . ERCP N/A 03/29/2019   Procedure: ENDOSCOPIC RETROGRADE CHOLANGIOPANCREATOGRAPHY (ERCP);  Surgeon: JMilus Banister MD;  [facility];  Service: Endoscopy;  Laterality: N/A;  . ESOPHAGOGASTRODUODENOSCOPY N/A 06/17/2019   Procedure: ESOPHAGOGASTRODUODENOSCOPY (EGD);  Surgeon: Gatha Mayer, MD;  Location[facility];  Service: Gastroenterology;  Laterality: N/A;  . ESOPHAGOGASTRODUODENOSCOPY (EGD) WITH PROPOFOL N/A 03/29/2019   Procedure: ESOPHAGOGASTRODUODENOSCOPY (EGD) WITH PROPOFOL;  Surgeon: [name], MD;  Location[facility]COPY;  Service: Endoscopy;  Laterality: N/A;  . ESOPHAGOGASTRODUODENOSCOPY (EGD) WITH PROPOFOL N/A 06/09/2019   Procedure: ESOPHAGOGASTRODUODENOSCOPY (EGD) WITH PROPOFOL;  Surgeon: [name], MD;  [facility]  END[facility];  Service: Endoscopy;  Laterality: N/A;  . EUS N/A 03/29/2019   Procedure: UPPER ENDOSCOPIC ULTRASOUND (EUS) LINEAR;  Surgeon: [name], MD;  Location: [facility] ENDOSCOPY;  Service: Endoscopy;  Laterality: N/A;  . EUS N/A 06/09/2019   Procedure: UPPER ENDOSCOPIC ULTRASOUND (EUS) LINEAR;  Surgeon: [name], MD;  [facility];  Service: Endoscopy;  Laterality: N/A;  . FIDUCIAL MARKER PLACEMENT  06/09/2019   Procedure: FIDUCIAL MARKER PLACEMENT;  Surgeon: [name], MD;  [facility] END[facility];  Service: Endoscopy;;  . FINE NEEDLE ASPIRATION  03/29/2019   Procedure: FINE NEEDLE ASPIRATION (FNA);  Surgeon: [name], MD;  [facility];  Service: Endoscopy;;  . HERNIA REPAIR     20+ years ago  . PORTACATH PLACEMENT N/A 04/12/2019   Procedure: INSERTION PORT-A-CATH LEFT SUBCLAVIAN;  Surgeon: Stark Klein, MD;  Location: [place] [facility];  Service: General;  Laterality: N/A;  . RETINAL DETACHMENT SURGERY    . SPHINCTEROTOMY  03/29/2019   Procedure: SPHINCTEROTOMY;  Surgeon: [name], MD;  Location: [facility] ENDOSCOPY;  Service: Endoscopy;;     OB History   No obstetric history on file.      Home Medications    Prior to Admission medications   Medication Sig Start Date End Date Taking? Authorizing Provider  diltiazem (CARDIZEM CD) 180 MG 24 hr capsule Take 180 mg by mouth daily. 05/18/18   [provider]  hydrocortisone (ANUSOL-HC) 2.5 % rectal cream Place rectally 2 (two) times daily. 06/23/19   Samuella Cota, MD  levothyroxine (SYNTHROID, LEVOTHROID) 50 MCG tablet Take 50 mcg by mouth every morning.    [provider]  metoCLOPramide (REGLAN) 5 MG tablet Take 1 tablet (5 mg total) by mouth 4 (four) times daily -  before meals and at bedtime. 06/23/19   Samuella Cota, MD  mirtazapine (REMERON) 7.5 MG tablet Take 1 tablet (7.5 mg total) by mouth at bedtime. 05/16/19   Alla Feeling, NP  olaparib (LYNPARZA)  100 MG tablet Take 1 tablet (100 mg total) by mouth 2 (two) times daily. Swallow whole. May take with food to decrease nausea and vomiting. 06/21/19   Truitt Merle, MD  ondansetron (ZOFRAN-ODT) 8 MG disintegrating tablet Take 1 tablet (8 mg total) by mouth every 8 (eight) hours as needed for nausea or vomiting. 3/50/09   Delora Fuel, MD  pantoprazole (PROTONIX) 40 MG tablet Take 1 tablet (40 mg total) by mouth 2 (two) times daily. 06/23/19 06/22/20  Samuella Cota, MD  potassium chloride 20 MEQ/15ML (10%) SOLN Take 15 mLs (20 mEq total) by mouth daily. Patient not taking: Reported on 06/01/2019 05/04/19 06/12/19  Alla Feeling, NP    Family History Family History  Problem Relation Age of Onset  . Bladder Cancer Brother   . Other Other        Denies family h/o cardiac disease  . Stomach cancer Maternal Uncle   . Throat cancer  Maternal Uncle     Social History Social History   Tobacco Use  . Smoking status: Never Smoker  . Smokeless tobacco: Never Used  Substance Use Topics  . Alcohol use: No  . Drug use: No     Allergies   Codeine   Review of Systems Review of Systems  Constitutional: Positive for fever. Negative for appetite change.  HENT: Negative for congestion.   Respiratory: Negative for shortness of breath.   Cardiovascular: Negative for chest pain.  Gastrointestinal: Positive for abdominal pain.       Abdominal pain is stable to improved.  Genitourinary: Negative for flank pain.  Musculoskeletal: Negative for back pain.  Skin: Negative for pallor.  Neurological: Negative for weakness.     Physical Exam Updated Vital Signs BP 129/62   Pulse (!) 113   Temp (!) 100.4 F (38 C) (Oral)   Resp 18   Ht '5\' 4"'$  (1.626 m)   Wt 54.4 kg   SpO2 95%   BMI 20.60 kg/m   Physical Exam Vitals signs and nursing note reviewed.  HENT:     Head: Normocephalic.  Eyes:     Pupils: Pupils are equal, round, and reactive to light.  Neck:     Musculoskeletal: Neck supple.   Cardiovascular:     Rate and Rhythm: Tachycardia present.  Pulmonary:     Effort: Pulmonary effort is normal.     Comments: Port-A-Cath to left chest wall is accessed but no erythema. Abdominal:     Tenderness: There is no abdominal tenderness.  Musculoskeletal:        General: No tenderness.  Skin:    General: Skin is warm.     Capillary Refill: Capillary refill takes less than 2 seconds.  Neurological:     Mental Status: She is alert and oriented to person, place, and time.      ED Treatments / Results  Labs (all labs ordered are listed, but only abnormal results are displayed) Labs Reviewed  CULTURE, BLOOD (ROUTINE X 2)  CULTURE, BLOOD (ROUTINE X 2)  URINE CULTURE  SARS CORONAVIRUS 2 (HOSPITAL ORDER, [place] LAB)  LACTIC ACID, PLASMA  LACTIC ACID, PLASMA  COMPREHENSIVE METABOLIC PANEL  LIPASE, BLOOD  CBC WITH DIFFERENTIAL/PLATELET  URINALYSIS, ROUTINE W REFLEX MICROSCOPIC    EKG None  Radiology No results found.  Procedures Procedures (including critical care time)  Medications Ordered in ED Medications  sodium chloride 0.9 % bolus 1,000 mL (1,000 mLs Intravenous New Bag/Given 07/14/19 1449)     Initial Impression / Assessment and Plan / ED Course  I have reviewed the triage vital signs and the nursing notes.  Pertinent labs & imaging results that were available during my care of the patient were reviewed by me and considered in my medical decision making (see chart for details).        Patient with fever.  Known pancreatic cancer.  Has a biliary stent also.  No abdominal pain however.  Maintain blood pressure.  Labs pending.  No cough or dysuria.  Doubt a severe sepsis at this time.  Has had TPN at home.  Care turned over to oncoming provider.  Final Clinical Impressions(s) / ED Diagnoses   Final diagnoses:  Fever, unspecified fever cause  Malignant neoplasm of pancreas, unspecified location of malignancy [facility])    ED  Discharge Orders    None       Davonna Belling, MD 07/14/19 1451

## 2019-07-14 NOTE — ED Notes (Signed)
Oncology at bedside

## 2019-07-14 NOTE — ED Provider Notes (Signed)
Pt signed out by Dr. Alvino Chapel pending labs.  She has an elevated lactic acid and RLL pna on cxr.  She has a fever and is tachycardic, but bp has been good.  She was given IVFs and rocephin/zithromax.  Covid negative.  The home health nurse was unable to get the pt's TPN to go through her port today.  No j-tube until she has her whipple surgery for her pancreatic cancer diagnosed in May of 2020.  She is not on chemo as she did not tolerate it.  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    IV team put tpa in her port to help with the blockage.  That is still sitting now.  Julie Rollins was evaluated in Emergency Department on 07/14/2019 for the symptoms described in the history of present illness. She was evaluated in the context of the global COVID-19 pandemic, which necessitated consideration that the patient might be at risk for infection with the SARS-CoV-2 virus that causes COVID-19. Institutional protocols and algorithms that pertain to the evaluation of patients at risk for COVID-19 are in a state of rapid change based on information released by regulatory bodies including the CDC and federal and state organizations. These policies and algorithms were followed during the patient's care in the ED.  Pt d/w Dr. Erlinda Hong (triad) for admission.   Julie Pence, MD 07/14/19 [phone]

## 2019-07-14 NOTE — ED Notes (Signed)
ED TO INPATIENT HANDOFF REPORT  Name/Age/Gender Julie Rollins 73 y.o. female  Code Status    Code Status Orders  (From admission, onward)         Start     Ordered   07/14/19 2123  Full code  Continuous     07/14/19 2123        Code Status History    Date Active Date Inactive Code Status Order ID Comments User Context   06/13/2019 1739 06/23/2019 1753 Full Code [id number]  Kerney Elbe, DO Inpatient   03/28/2019 1726 03/30/2019 1737 Full Code [id number]  Jonnie Finner, DO ED   07/31/2018 2047 08/06/2018 0006 Full Code [id number]  Clovis Riley, MD ED   Advance Care Planning Activity    Advance Directive Documentation     Most Recent Value  Type of Advance Directive  Living will  Pre-existing out of facility DNR order (yellow form or pink MOST form)  -  "MOST" Form in Place?  -      Home/SNF/Other Nursing Home  Chief Complaint fever  Level of Care/Admitting Diagnosis ED Disposition    ED Disposition Condition [place] [facility] Area: [place] [id number]  Level of Care: Telemetry [5]  Admit to tele based on following criteria: Other see comments  Comments: sepsis  Covid Evaluation: Confirmed COVID Negative  [facility]: [id number]  Admitting Physician: Florencia Reasons [id number]  Attending Physician: Florencia Reasons [id number]  Estimated length of stay: past midnight tomorrow  Certification:: I certify this patient will need inpatient services for at least 2 midnights  PT Class (Do Not Modify): Inpatient [101]  PT Acc Code (Do Not Modify): Private [1]       Medical History Past Medical History:  Diagnosis Date  . Anxiety   . Blood transfusion without reported diagnosis   . Breast CA ([place])    s/p lumpectomy and radiation 2000  . Breast cancer ([place])   . Colitis    2007  . Dyspnea   . Dysrhythmia   . Family history of bladder cancer   . Family history of stomach cancer   . Family history of throat cancer   . Hernia,  epigastric   . HTN (hypertension)    "mild hypertension", was on BP med years ago but it caused orthostatic hypotension and she has not been medicated since  . Hypothyroid   . Pancreatic cancer ([place]) dx'd 03/2019  . Personal history of breast cancer   . Personal history of radiation therapy 2001  . Poor appetite 04/2019  . Vertigo   . Vertigo     Allergies Allergies  Allergen Reactions  . Codeine Nausea Only    IV Location/Drains/Wounds Patient Lines/Drains/Airways Status   Active Line/Drains/Airways    Name:   Placement date:   Placement time:   Site:   Days:   Implanted Port 04/12/19 Left Chest   04/12/19    1552    Chest   93   Peripheral IV 07/14/19 Right Antecubital   07/14/19    1700    Antecubital   less than 1   GI Stent 10 Fr.   03/29/19    0926    -   107   Incision (Closed) 08/02/18 Neck   08/02/18    1357     346   Incision (Closed) 04/12/19 Chest Left   04/12/19    1559     93  Labs/Imaging Results for orders placed or performed during the hospital encounter of 07/14/19 (from the past 48 hour(s))  CBC with Differential     Status: Abnormal   Collection Time: 07/14/19  3:06 PM  Result Value Ref Range   WBC 5.6 4.0 - 10.5 K/uL   RBC 3.51 (L) 3.87 - 5.11 MIL/uL   Hemoglobin 10.9 (L) 12.0 - 15.0 g/dL   HCT 33.2 (L) 36.0 - 46.0 %   MCV 94.6 80.0 - 100.0 fL   MCH 31.1 26.0 - 34.0 pg   MCHC 32.8 30.0 - 36.0 g/dL   RDW 14.5 11.5 - 15.5 %   Platelets 343 150 - 400 K/uL   nRBC 0.0 0.0 - 0.2 %   Neutrophils Relative % 84 %   Neutro Abs 4.7 1.7 - 7.7 K/uL   Lymphocytes Relative 5 %   Lymphs Abs 0.3 (L) 0.7 - 4.0 K/uL   Monocytes Relative 10 %   Monocytes Absolute 0.6 0.1 - 1.0 K/uL   Eosinophils Relative 0 %   Eosinophils Absolute 0.0 0.0 - 0.5 K/uL   Basophils Relative 0 %   Basophils Absolute 0.0 0.0 - 0.1 K/uL   Immature Granulocytes 1 %   Abs Immature Granulocytes 0.03 0.00 - 0.07 K/uL    Comment: Performed at [facility], [place] [address] [place], [place] [address]  Lactic acid, plasma     Status: Abnormal   Collection Time: 07/14/19  3:09 PM  Result Value Ref Range   Lactic Acid, Venous 2.3 (HH) 0.5 - 1.9 mmol/L    Comment: CRITICAL RESULT CALLED TO, READ BACK BY AND VERIFIED WITH: A.STRADER AT 1553 ON 07/14/19 BY N.THOMPSON Performed at [facility], [place] [address], [place], [place] [address]   SARS Coronavirus 2 [facility] order, Performed in [facility] hospital lab) Nasopharyngeal Nasopharyngeal Swab     Status: None   Collection Time: 07/14/19  3:23 PM   Specimen: Nasopharyngeal Swab  Result Value Ref Range   SARS Coronavirus 2 NEGATIVE NEGATIVE    Comment: (NOTE) If result is NEGATIVE SARS-CoV-2 target nucleic acids are NOT DETECTED. The SARS-CoV-2 RNA is generally detectable in upper and lower  respiratory specimens during the acute phase of infection. The lowest  concentration of SARS-CoV-2 viral copies this assay can detect is 250  copies / mL. A negative result does not preclude SARS-CoV-2 infection  and should not be used as the sole basis for treatment or other  patient management decisions.  A negative result may occur with  improper specimen collection / handling, submission of specimen other  than nasopharyngeal swab, presence of viral mutation(s) within the  areas targeted by this assay, and inadequate number of viral copies  (<250 copies / mL). A negative result must be combined with clinical  observations, patient history, and epidemiological information. If result is POSITIVE SARS-CoV-2 target nucleic acids are DETECTED. The SARS-CoV-2 RNA is generally detectable in upper and lower  respiratory specimens dur ing the acute phase of infection.  Positive  results are indicative of active infection with SARS-CoV-2.  Clinical  correlation with patient history and other diagnostic information is  necessary to determine patient infection status.  Positive results do  not  rule out bacterial infection or co-infection with other viruses. If result is PRESUMPTIVE POSTIVE SARS-CoV-2 nucleic acids MAY BE PRESENT.   A presumptive positive result was obtained on the submitted specimen  and confirmed on repeat testing.  While 2019 novel coronavirus  (SARS-CoV-2)  nucleic acids may be present in the submitted sample  additional confirmatory testing may be necessary for epidemiological  and / or clinical management purposes  to differentiate between  SARS-CoV-2 and other Sarbecovirus currently known to infect humans.  If clinically indicated additional testing with an alternate test  methodology [phone]) is advised. The SARS-CoV-2 RNA is generally  detectable in upper and lower respiratory sp ecimens during the acute  phase of infection. The expected result is Negative. Fact Sheet for Patients:  StrictlyIdeas.no Fact Sheet for Healthcare Providers: BankingDealers.co.za This test is not yet approved or cleared by the [place] FDA and has been authorized for detection and/or diagnosis of SARS-CoV-2 by FDA under an Emergency Use Authorization (EUA).  This EUA will remain in effect (meaning this test can be used) for the duration of the COVID-19 declaration under Section 564(b)(1) of the Act, 21 U.S.C. section 360bbb-3(b)(1), unless the authorization is terminated or revoked sooner. Performed at [facility], [place] [address] [place], [place] [address]   Comprehensive metabolic panel     Status: Abnormal   Collection Time: 07/14/19  3:44 PM  Result Value Ref Range   Sodium 133 (L) 135 - 145 mmol/L   Potassium 4.7 3.5 - 5.1 mmol/L   Chloride 103 98 - 111 mmol/L   CO2 22 22 - 32 mmol/L   Glucose, Bld 151 (H) 70 - 99 mg/dL   BUN 16 8 - 23 mg/dL   Creatinine, Ser 0.52 0.44 - 1.00 mg/dL   Calcium 7.7 (L) 8.9 - 10.3 mg/dL   Total Protein 5.4 (L) 6.5 - 8.1 g/dL   Albumin 2.2 (L) 3.5 - 5.0 g/dL    AST 45 (H) 15 - 41 U/L   ALT 20 0 - 44 U/L   Alkaline Phosphatase 102 38 - 126 U/L   Total Bilirubin 1.5 (H) 0.3 - 1.2 mg/dL   GFR calc non Af Amer >60 >60 mL/min   GFR calc Af Amer >60 >60 mL/min   Anion gap 8 5 - 15    Comment: Performed at [facility], [place] [address] [place], [place] [address]  Lipase, blood     Status: None   Collection Time: 07/14/19  3:44 PM  Result Value Ref Range   Lipase 45 11 - 51 U/L    Comment: Performed at [facility], [place] [address] [place], [place] [address]  Lactic acid, plasma     Status: None   Collection Time: 07/14/19  4:59 PM  Result Value Ref Range   Lactic Acid, Venous 1.5 0.5 - 1.9 mmol/L    Comment: Performed at [facility], [place] [address], [place], [place] [address]  Urinalysis, Routine w reflex microscopic     Status: Abnormal   Collection Time: 07/14/19  6:11 PM  Result Value Ref Range   Color, Urine STRAW (A) YELLOW   APPearance CLEAR CLEAR   Specific Gravity, Urine 1.009 1.005 - 1.030   pH 8.0 5.0 - 8.0   Glucose, UA NEGATIVE NEGATIVE mg/dL   Hgb urine dipstick NEGATIVE NEGATIVE   Bilirubin Urine NEGATIVE NEGATIVE   Ketones, ur NEGATIVE NEGATIVE mg/dL   Protein, ur NEGATIVE NEGATIVE mg/dL   Nitrite NEGATIVE NEGATIVE   Leukocytes,Ua NEGATIVE NEGATIVE    Comment: Performed at [place] [address], [place],  [address]   Dg Chest Portable 1 View  Result Date: 07/14/2019 CLINICAL DATA:  Fever EXAM: PORTABLE CHEST 1 VIEW COMPARISON:  04/12/2019 FINDINGS: Left-sided central venous port tip over the SVC. No pleural effusion. Stable cardiomediastinal silhouette. Small foci of airspace disease in the right base. No pneumothorax IMPRESSION: 1. Small patchy foci of airspace disease at the right base are questionable for foci of infection or inflammation 2. The lung fields are otherwise clear Electronically Signed   By: Donavan Foil M.D.   On:  07/14/2019 16:09    Pending Labs Unresulted Labs (From admission, onward)    Start     Ordered   07/21/19 0500  Creatinine, serum  (enoxaparin (LOVENOX)    CrCl >/= 30 ml/min)  Weekly,   R    Comments: while on enoxaparin therapy    07/14/19 2123   07/15/19 0500  CBC  Tomorrow morning,   R     07/14/19 2123   07/15/19 0500  Procalcitonin  Daily,   R     07/14/19 2123   07/14/19 2123  CBC  (enoxaparin (LOVENOX)    CrCl >/= 30 ml/min)  Once,   STAT    Comments: Baseline for enoxaparin therapy IF NOT ALREADY DRAWN.  Notify MD if PLT < 100 K.    07/14/19 2123   07/14/19 2123  Creatinine, serum  (enoxaparin (LOVENOX)    CrCl >/= 30 ml/min)  Once,   STAT    Comments: Baseline for enoxaparin therapy IF NOT ALREADY DRAWN.    07/14/19 2123   07/14/19 2123  MRSA PCR Screening  Once,   STAT     07/14/19 2123   07/14/19 1434  Culture, blood (routine x 2)  BLOOD CULTURE X 2,   STAT    Comments: 1 peripheral 1 off port.    07/14/19 1433   07/14/19 1434  Urine culture  ONCE - STAT,   STAT     07/14/19 1433          Vitals/Pain Today's Vitals   07/14/19 2145 07/14/19 2200 07/14/19 2230 07/14/19 2233  BP:  (!) 131/58 122/65   Pulse: (!) 121 (!) 120 (!) 119   Resp: (!) 24 (!) 23 18   Temp:      TempSrc:      SpO2: 96% 95% 97%   Weight:      Height:      PainSc:    0-No pain    Isolation Precautions No active isolations  Medications Medications  diltiazem (CARDIZEM CD) 24 hr capsule 180 mg (has no administration in time range)  mirtazapine (REMERON) tablet 7.5 mg (7.5 mg Oral Given 07/14/19 2246)  levothyroxine (SYNTHROID) tablet 50 mcg (has no administration in time range)  metoCLOPramide (REGLAN) tablet 5 mg (5 mg Oral Given 07/14/19 2246)  ondansetron (ZOFRAN-ODT) disintegrating tablet 8 mg (has no administration in time range)  pantoprazole (PROTONIX) EC tablet 40 mg (40 mg Oral Given 07/14/19 2151)  multivitamin with minerals tablet 1 tablet (has no administration in time  range)  hydrocortisone (ANUSOL-HC) 2.5 % rectal cream (has no administration in time range)  enoxaparin (LOVENOX) injection 40 mg (has no administration in time range)  0.9 %  sodium chloride infusion (has no administration in time range)  cefTRIAXone (ROCEPHIN) 1 g in sodium chloride 0.9 % 100 mL IVPB (has no administration in time range)  azithromycin (ZITHROMAX) 500 mg in sodium chloride 0.9 % 250 mL IVPB (500 mg Intravenous New Bag/Given 07/14/19 2158)  sodium chloride 0.9 % bolus 1,000 mL (0 mLs Intravenous Stopped 07/14/19 1600)  alteplase (CATHFLO ACTIVASE) injection 2 mg (2 mg Intracatheter  Given 07/14/19 1735)  cefTRIAXone (ROCEPHIN) 1 g in sodium chloride 0.9 % 100 mL IVPB (0 g Intravenous Stopped 07/14/19 1952)  azithromycin (ZITHROMAX) 500 mg in sodium chloride 0.9 % 250 mL IVPB (0 mg Intravenous Stopped 07/14/19 1750)  sodium chloride 0.9 % bolus 1,000 mL (0 mLs Intravenous Stopped 07/14/19 1952)  LORazepam (ATIVAN) tablet 1 mg (1 mg Oral Given 07/14/19 2231)    Mobility walks

## 2019-07-14 NOTE — H&P (Addendum)
History and Physical  Julie Rollins:[id number] DOB: September 12, 1946 DOA: 07/14/2019  Referring physician: EDP PCP: Nolene Ebbs, MD   Chief Complaint: fever  HPI: Julie Rollins is a 73 y.o. female   H/o pancreatic cancer with partial gastric outlet obstruction  On home TPN is sent from home to the ED due to fever.  she just returned home from SNF, home health RN comes in to get her TPN going, however, her ports does not work and she has fever, she called her oncologist who instructed her to come to the ED. She denies cough,  no dysuria, she reports some intermittent diarrhea thought due to radiation treatment, she denies n/v, no ab pain.  ED  Course: Fever 100.4, sinus tachycardia, bp stable, no hypoxia at rest.  Wbc 5.6, hgb 10.9, sodium 133, cr 0.52, ast 45, tbili 1.5, lactic acid 2.3, blood culture obtained, cxr possible RLL pneumonia, she is given rocephinx1 and zithromax x1, she received 2liters of ns, hospitalist called to admit the patient.  COVID screening is negative.   Review of Systems:  Detail per HPI, Review of systems are otherwise negative  Past Medical History:  Diagnosis Date  . Anxiety   . Blood transfusion without reported diagnosis   . Breast CA ([place])    s/p lumpectomy and radiation 2000  . Breast cancer ([place])   . Colitis    2007  . Dyspnea   . Dysrhythmia   . Family history of bladder cancer   . Family history of stomach cancer   . Family history of throat cancer   . Hernia, epigastric   . HTN (hypertension)    "mild hypertension", was on BP med years ago but it caused orthostatic hypotension and she has not been medicated since  . Hypothyroid   . Pancreatic cancer ([place]) dx'd 03/2019  . Personal history of breast cancer   . Personal history of radiation therapy 2001  . Poor appetite 04/2019  . Vertigo   . Vertigo    Past Surgical History:  Procedure Laterality Date  . ABDOMINAL HYSTERECTOMY     73yrs ago  . ANTERIOR CERVICAL DECOMP/DISCECTOMY  FUSION N/A 08/02/2018   Procedure: ANTERIOR CERVICAL DECOMPRESSION/DISCECTOMY FUSION CERVICAL FIVE- CERVICAL SIX;  Surgeon: Earnie Larsson, MD;  Location: [facility];  Service: Neurosurgery;  Laterality: N/A;  ANTERIOR CERVICAL DECOMPRESSION/DISCECTOMY FUSION CERVICAL FIVE- CERVICAL SIX  . BILIARY BRUSHING  03/29/2019   Procedure: BILIARY BRUSHING;  Surgeon: [name], MD;  Location: [facility] ENDOSCOPY;  Service: Endoscopy;;  . BILIARY STENT PLACEMENT  03/29/2019   Procedure: BILIARY STENT PLACEMENT;  Surgeon: [name], MD;  Location: [facility] ENDOSCOPY;  Service: Endoscopy;;  . BIOPSY  03/29/2019   Procedure: BIOPSY;  Surgeon: [name], MD;  Location: [facility] ENDOSCOPY;  Service: Endoscopy;;  . BREAST LUMPECTOMY     2000  . ERCP N/A 03/29/2019   Procedure: ENDOSCOPIC RETROGRADE CHOLANGIOPANCREATOGRAPHY (ERCP);  Surgeon: [name], MD;  Location: [facility] ENDOSCOPY;  Service: Endoscopy;  Laterality: N/A;  . ESOPHAGOGASTRODUODENOSCOPY N/A 06/17/2019   Procedure: ESOPHAGOGASTRODUODENOSCOPY (EGD);  Surgeon: Gatha Mayer, MD;  Location: [facility] ENDOSCOPY;  Service: Gastroenterology;  Laterality: N/A;  . ESOPHAGOGASTRODUODENOSCOPY (EGD) WITH PROPOFOL N/A 03/29/2019   Procedure: ESOPHAGOGASTRODUODENOSCOPY (EGD) WITH PROPOFOL;  Surgeon: [name], MD;  Location: [facility] ENDOSCOPY;  Service: Endoscopy;  Laterality: N/A;  . ESOPHAGOGASTRODUODENOSCOPY (EGD) WITH PROPOFOL N/A 06/09/2019   Procedure: ESOPHAGOGASTRODUODENOSCOPY (EGD) WITH PROPOFOL;  Surgeon: [name], MD;  Location: [facility] ENDOSCOPY;  Service:  Endoscopy;  Laterality: N/A;  . EUS N/A 03/29/2019   Procedure: UPPER ENDOSCOPIC ULTRASOUND (EUS) LINEAR;  Surgeon: [name], MD;  Location: [facility] ENDOSCOPY;  Service: Endoscopy;  Laterality: N/A;  . EUS N/A 06/09/2019   Procedure: UPPER ENDOSCOPIC ULTRASOUND (EUS) LINEAR;  Surgeon: [name], MD;  Location: [facility] ENDOSCOPY;  Service: Endoscopy;  Laterality: N/A;  . FIDUCIAL MARKER PLACEMENT  06/09/2019    Procedure: FIDUCIAL MARKER PLACEMENT;  Surgeon: [name], MD;  Location: [facility] ENDOSCOPY;  Service: Endoscopy;;  . FINE NEEDLE ASPIRATION  03/29/2019   Procedure: FINE NEEDLE ASPIRATION (FNA);  Surgeon: [name], MD;  Location: [facility] ENDOSCOPY;  Service: Endoscopy;;  . HERNIA REPAIR     20+ years ago  . PORTACATH PLACEMENT N/A 04/12/2019   Procedure: INSERTION PORT-A-CATH LEFT SUBCLAVIAN;  Surgeon: Stark Klein, MD;  Location: [facility];  Service: General;  Laterality: N/A;  . RETINAL DETACHMENT SURGERY    . SPHINCTEROTOMY  03/29/2019   Procedure: SPHINCTEROTOMY;  Surgeon: [name], MD;  Location: [facility] ENDOSCOPY;  Service: Endoscopy;;   Social History:  reports that she has never smoked. She has never used smokeless tobacco. She reports that she does not drink alcohol or use drugs. Patient lives at home alone & is able to participate in activities of daily living independently   Allergies  Allergen Reactions  . Codeine Nausea Only    Family History  Problem Relation Age of Onset  . Bladder Cancer Brother   . Other Other        Denies family h/o cardiac disease  . Stomach cancer Maternal Uncle   . Throat cancer Maternal Uncle       Prior to Admission medications   Medication Sig Start Date End Date Taking? Authorizing Provider  diltiazem (CARDIZEM CD) 180 MG 24 hr capsule Take 180 mg by mouth daily. 05/18/18   [provider]  hydrocortisone (ANUSOL-HC) 2.5 % rectal cream Place rectally 2 (two) times daily. 06/23/19   Samuella Cota, MD  levothyroxine (SYNTHROID, LEVOTHROID) 50 MCG tablet Take 50 mcg by mouth every morning.    [provider]  metoCLOPramide (REGLAN) 5 MG tablet Take 1 tablet (5 mg total) by mouth 4 (four) times daily -  before meals and at bedtime. 06/23/19   Samuella Cota, MD  mirtazapine (REMERON) 7.5 MG tablet Take 1 tablet (7.5 mg total) by mouth at bedtime. 05/16/19   Alla Feeling, NP  olaparib (LYNPARZA)  100 MG tablet Take 1 tablet (100 mg total) by mouth 2 (two) times daily. Swallow whole. May take with food to decrease nausea and vomiting. 06/21/19   Truitt Merle, MD  ondansetron (ZOFRAN-ODT) 8 MG disintegrating tablet Take 1 tablet (8 mg total) by mouth every 8 (eight) hours as needed for nausea or vomiting. 0/93/23   Delora Fuel, MD  pantoprazole (PROTONIX) 40 MG tablet Take 1 tablet (40 mg total) by mouth 2 (two) times daily. 06/23/19 06/22/20  Samuella Cota, MD  potassium chloride 20 MEQ/15ML (10%) SOLN Take 15 mLs (20 mEq total) by mouth daily. Patient not taking: Reported on 06/01/2019 05/04/19 06/12/19  Alla Feeling, NP    Physical Exam: BP 117/74   Pulse (!) 101   Temp (!) 100.4 F (38 C) (Oral)   Resp 19   Ht 5\' 4"  (1.626 m)   Wt 54.4 kg   SpO2 97%   BMI 20.60 kg/m   . General:  Thin, NAD, aaox3 .  Eyes: PERRL . ENT: unremarkable . Neck: supple, no JVD . Cardiovascular: tachycardia . Respiratory: CTABL, + left sided chest port , no erythema, she declined me exam the chest port due to IV RN "just put some medicine in it" " she said no one touches it in the next 2hrs" . Abdomen: soft/NT/ND, positive bowel sounds . Skin: no rash . Musculoskeletal:  No edema . Psychiatric: calm/cooperative . Neurologic: no focal findings            Labs on Admission:  Basic Metabolic Panel: Recent Labs  Lab 07/14/19 1544  NA 133*  K 4.7  CL 103  CO2 22  GLUCOSE 151*  BUN 16  CREATININE 0.52  CALCIUM 7.7*   Liver Function Tests: Recent Labs  Lab 07/14/19 1544  AST 45*  ALT 20  ALKPHOS 102  BILITOT 1.5*  PROT 5.4*  ALBUMIN 2.2*   Recent Labs  Lab 07/14/19 1544  LIPASE 45   No results for input(s): AMMONIA in the last 168 hours. CBC: Recent Labs  Lab 07/14/19 1506  WBC 5.6  NEUTROABS 4.7  HGB 10.9*  HCT 33.2*  MCV 94.6  PLT 343   Cardiac Enzymes: No results for input(s): CKTOTAL, CKMB, CKMBINDEX, TROPONINI in the last 168 hours.  BNP (last 3 results) No  results for input(s): BNP in the last 8760 hours.  ProBNP (last 3 results) No results for input(s): PROBNP in the last 8760 hours.  CBG: No results for input(s): GLUCAP in the last 168 hours.  Radiological Exams on Admission: Dg Chest Portable 1 View  Result Date: 07/14/2019 CLINICAL DATA:  Fever EXAM: PORTABLE CHEST 1 VIEW COMPARISON:  04/12/2019 FINDINGS: Left-sided central venous port tip over the SVC. No pleural effusion. Stable cardiomediastinal silhouette. Small foci of airspace disease in the right base. No pneumothorax IMPRESSION: 1. Small patchy foci of airspace disease at the right base are questionable for foci of infection or inflammation 2. The lung fields are otherwise clear Electronically Signed   By: Donavan Foil M.D.   On: 07/14/2019 16:09     Assessment/Plan Present on Admission: . Sepsis ([place])    Sepsis: presents with fever, tachycardia,  lactic acidosis -COVID screening test negative in the ED -blood culture obtained in the ED, UA ordered pending collection, cxr with RLL PNA, chest port does not work , but does not appear to have associated cellulitis , she denies associated pain -she received one liter of ns, rocephin/zithromax -aspiration pna? 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 -she was hospitalized in July for n/v, she is discharged on home TPN and liquid diet as tolerated -will need to consult pharmacy for TPN  Assistance once port is working, continue ivf, liquid diet for now  [place] malfunction:  IV team is assisting  F/u blood culture   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 Monitor  HTN: continue home meds cardizem with holding parameters  Hypothyroidism: continue synthroid  DVT prophylaxis: lovenox  Consultants: none called, oncology aware patient's admission  Code Status: full   Family Communication:  Patient   Disposition Plan: admit to med tele  Time spent: 78mins  Florencia Reasons MD, PhD, [place] [facility] Pager [phone] If 7PM-7AM, please contact night-coverage at [email or web address], password [facility]

## 2019-07-14 NOTE — ED Notes (Signed)
X-ray at bedside

## 2019-07-14 NOTE — Progress Notes (Signed)
Julie Rollins   DOB:[birth year]   IR#:[id number]   JKK#:[id number]  Oncology follow up   Subjective: Patient is well-known to me, under my care for her pancreatic cancer.  I got a call from her home care nurse earlier today about her fever, and I recommend her to be evaluated at the ED.  I saw her in the ED, she was still febrile, with mild tachycardia.  She states they could not get blood return from her port today. She has been using port for TPN since last hospital stay in late July. Her port did not get proper flush and needle exchanged last week.  Her home care nurse did it 2 days ago. She denies any skin erythema around her port or tenderness. No cough, new abdominal discomfort or diarrhea.    Objective:  Vitals:   07/14/19 1830 07/14/19 1900  BP: 114/60 (!) 122/56  Pulse: (!) 107 (!) 107  Resp: 20 (!) 24  Temp:    SpO2: 99% 96%    Body mass index is 20.6 kg/m.  Intake/Output Summary (Last 24 hours) at 07/14/2019 2019 Last data filed at 07/14/2019 1810 Gross per 24 hour  Intake -  Output 320 ml  Net -320 ml     Sclerae unicteric  Skin: no rash. Port site is clean, no skin erythema or discharge.  No peripheral adenopathy  Lungs clear -- no rales or rhonchi  Heart regular rate and rhythm  Abdomen benign  MSK no focal spinal tenderness, no peripheral edema  Neuro nonfocal    CBG (last 3)  No results for input(s): GLUCAP in the last 72 hours.   Labs:  Lab Results  Component Value Date   WBC 5.6 07/14/2019   HGB 10.9 (L) 07/14/2019   HCT 33.2 (L) 07/14/2019   MCV 94.6 07/14/2019   PLT 343 07/14/2019   NEUTROABS 4.7 07/14/2019    Urine Studies No results for input(s): UHGB, CRYS in the last 72 hours.  Invalid input(s): UACOL, UAPR, USPG, UPH, UTP, UGL, UKET, UBIL, UNIT, UROB, ULEU, UEPI, UWBC, URBC, UBAC, CAST, UCOM, BILUA  Basic Metabolic Panel: Recent Labs  Lab 07/14/19 1544  NA 133*  K 4.7  CL 103  CO2 22  GLUCOSE 151*  BUN 16  CREATININE 0.52   CALCIUM 7.7*   GFR Estimated Creatinine Clearance: 52.2 mL/min (by C-G formula based on SCr of 0.52 mg/dL). Liver Function Tests: Recent Labs  Lab 07/14/19 1544  AST 45*  ALT 20  ALKPHOS 102  BILITOT 1.5*  PROT 5.4*  ALBUMIN 2.2*   Recent Labs  Lab 07/14/19 1544  LIPASE 45   No results for input(s): AMMONIA in the last 168 hours. Coagulation profile No results for input(s): INR, PROTIME in the last 168 hours.  CBC: Recent Labs  Lab 07/14/19 1506  WBC 5.6  NEUTROABS 4.7  HGB 10.9*  HCT 33.2*  MCV 94.6  PLT 343   Cardiac Enzymes: No results for input(s): CKTOTAL, CKMB, CKMBINDEX, TROPONINI in the last 168 hours. BNP: Invalid input(s): POCBNP CBG: No results for input(s): GLUCAP in the last 168 hours. D-Dimer No results for input(s): DDIMER in the last 72 hours. Hgb A1c No results for input(s): HGBA1C in the last 72 hours. Lipid Profile No results for input(s): CHOL, HDL, LDLCALC, TRIG, CHOLHDL, LDLDIRECT in the last 72 hours. Thyroid function studies No results for input(s): TSH, T4TOTAL, T3FREE, THYROIDAB in the last 72 hours.  Invalid input(s): FREET3 Anemia work up No results for input(s): VITAMINB12,  FOLATE, FERRITIN, TIBC, IRON, RETICCTPCT in the last 72 hours. Microbiology Recent Results (from the past 240 hour(s))  SARS Coronavirus 2 [facility] order, Performed in [facility] hospital lab) Nasopharyngeal Nasopharyngeal Swab     Status: None   Collection Time: 07/14/19  3:23 PM   Specimen: Nasopharyngeal Swab  Result Value Ref Range Status   SARS Coronavirus 2 NEGATIVE NEGATIVE Final    Comment: (NOTE) If result is NEGATIVE SARS-CoV-2 target nucleic acids are NOT DETECTED. The SARS-CoV-2 RNA is generally detectable in upper and lower  respiratory specimens during the acute phase of infection. The lowest  concentration of SARS-CoV-2 viral copies this assay can detect is 250  copies / mL. A negative result does not preclude SARS-CoV-2 infection   and should not be used as the sole basis for treatment or other  patient management decisions.  A negative result may occur with  improper specimen collection / handling, submission of specimen other  than nasopharyngeal swab, presence of viral mutation(s) within the  areas targeted by this assay, and inadequate number of viral copies  (<250 copies / mL). A negative result must be combined with clinical  observations, patient history, and epidemiological information. If result is POSITIVE SARS-CoV-2 target nucleic acids are DETECTED. The SARS-CoV-2 RNA is generally detectable in upper and lower  respiratory specimens dur ing the acute phase of infection.  Positive  results are indicative of active infection with SARS-CoV-2.  Clinical  correlation with patient history and other diagnostic information is  necessary to determine patient infection status.  Positive results do  not rule out bacterial infection or co-infection with other viruses. If result is PRESUMPTIVE POSTIVE SARS-CoV-2 nucleic acids MAY BE PRESENT.   A presumptive positive result was obtained on the submitted specimen  and confirmed on repeat testing.  While 2019 novel coronavirus  (SARS-CoV-2) nucleic acids may be present in the submitted sample  additional confirmatory testing may be necessary for epidemiological  and / or clinical management purposes  to differentiate between  SARS-CoV-2 and other Sarbecovirus currently known to infect humans.  If clinically indicated additional testing with an alternate test  methodology [phone]) is advised. The SARS-CoV-2 RNA is generally  detectable in upper and lower respiratory sp ecimens during the acute  phase of infection. The expected result is Negative. Fact Sheet for Patients:  StrictlyIdeas.no Fact Sheet for Healthcare Providers: BankingDealers.co.za This test is not yet approved or cleared by the [place] FDA  and has been authorized for detection and/or diagnosis of SARS-CoV-2 by FDA under an Emergency Use Authorization (EUA).  This EUA will remain in effect (meaning this test can be used) for the duration of the COVID-19 declaration under Section 564(b)(1) of the Act, 21 U.S.C. section 360bbb-3(b)(1), unless the authorization is terminated or revoked sooner. Performed at [facility], [place] [address] [place], [place] [address]       Studies:  Dg Chest Portable 1 View  Result Date: 07/14/2019 CLINICAL DATA:  Fever EXAM: PORTABLE CHEST 1 VIEW COMPARISON:  04/12/2019 FINDINGS: Left-sided central venous port tip over the SVC. No pleural effusion. Stable cardiomediastinal silhouette. Small foci of airspace disease in the right base. No pneumothorax IMPRESSION: 1. Small patchy foci of airspace disease at the right base are questionable for foci of infection or inflammation 2. The lung fields are otherwise clear Electronically Signed   By: Donavan Foil M.D.   On: 07/14/2019 16:09    Assessment: 73 y.o. female with   1. Fever and  sepsis, ? RLL pneumonia  2.  Locally advanced pancreatic cancer with duodenal involvement. 3.  Partial gastric outlet obstruction due to pancreatic cancer 4.  Malnutrition, currently on TPN through port  5. Anemia 6.HTN  7.  Hypothyroidism 8. 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  Cultures are pending.  Broad antibiotics has started.  Also her port site does not have signs of skin infection, she has been receiving TPN through her port, and the needle was not changed appropriate last week, concerning for port related infection.  We will follow-up her blood cultures. -agree with broad antibiotics  -she is supposed to start PARP inhibitor Lonie Peak this weekend for her pancreatic cancer, will hold it for now.  -appreciate hospitalist team  care  -I will f/u while she is in hospital     Truitt Merle, MD 07/14/2019  8:19 PM

## 2019-07-14 NOTE — ED Notes (Signed)
Date and time results received: 07/14/19  (use smartphrase ".now" to insert current time)  Test: Lactic Acid Critical Value: 2.3  Name of Provider Notified: Gilford Raid

## 2019-07-14 NOTE — ED Triage Notes (Addendum)
Pt to ED via EMS from home with c/o fever since last night, pt reports highest temp 102. Pt has pancreatic cancer. Pt has port that is currently accessed, reports accessed by home health nurse on Tuesday and she is concerned of infection, pt receiving TPN at home.

## 2019-07-14 NOTE — Telephone Encounter (Signed)
Oral Oncology Patient Advocate Encounter  Confirmed with [place] that Lonie Peak was shipped on 8/20 with a $8.95 copay.   [place] Patient [place] Phone [phone] Fax [phone] 07/14/2019   2:21 PM

## 2019-07-14 NOTE — Telephone Encounter (Signed)
"  [place] [phone]).  Julie Rollins was well yesterday but today she has fever and tachycardia.  Cannot hang TPN.  What would Dr. [name] like me to do?   Temp = 102.0  Pulse = 115 Lung sounds are clear; oxygen saturation good; no cough, COVID-19 symptoms, pain or other symptoms."  Denies provided cell number to contact MD.

## 2019-07-14 NOTE — ED Notes (Signed)
IV team at bedside 

## 2019-07-14 NOTE — ED Notes (Signed)
BLADDER SCAN RESULTED 359mL's

## 2019-07-15 LAB — CBC
HCT: 30.4 % — ABNORMAL LOW (ref 36.0–46.0)
Hemoglobin: 9.7 g/dL — ABNORMAL LOW (ref 12.0–15.0)
MCH: 31.2 pg (ref 26.0–34.0)
MCHC: 31.9 g/dL (ref 30.0–36.0)
MCV: 97.7 fL (ref 80.0–100.0)
Platelets: 201 10*3/uL (ref 150–400)
RBC: 3.11 MIL/uL — ABNORMAL LOW (ref 3.87–5.11)
RDW: 14.4 % (ref 11.5–15.5)
WBC: 7.5 10*3/uL (ref 4.0–10.5)
nRBC: 0 % (ref 0.0–0.2)

## 2019-07-15 LAB — URINE CULTURE: Culture: NO GROWTH

## 2019-07-15 LAB — MRSA PCR SCREENING: MRSA by PCR: NEGATIVE

## 2019-07-15 LAB — PROCALCITONIN: Procalcitonin: 0.43 ng/mL

## 2019-07-15 MED ORDER — ALTEPLASE 2 MG IJ SOLR
2.0000 mg | Freq: Once | INTRAMUSCULAR | Status: AC
  Administered 2019-07-15: 2 mg
  Filled 2019-07-15: qty 2

## 2019-07-15 NOTE — Progress Notes (Signed)
[facility] Radiation Oncology Simulation and Treatment Planning Note   Name:  Julie Rollins MRN: [id number]   Date: 06/16/2019  DOB: [birth year]  Status:outpatient    DIAGNOSIS:    ICD-10-CM   1. Carcinoma of head of pancreas ([place])  C25.0      CONSENT VERIFIED:yes   SET UP: Patient is setup supine   IMMOBILIZATION: The patient was immobilized using a Vac Loc bag and Accuform device.   NARRATIVE:The patient was brought to the [facility].  Identity was confirmed.  All relevant records and images related to the planned course of therapy were reviewed.  Then, the patient was positioned in a stable reproducible clinical set-up for radiation therapy. Abdominal compression was applied .  4D CT images were obtained and reproducible breathing pattern was confirmed. Free breathing CT images were obtained.  Skin markings were placed.  The CT images were loaded into the planning software where the target and avoidance structures were contoured.  The radiation prescription was entered and confirmed.    TREATMENT PLANNING NOTE:  Treatment planning then occurred. I have requested : IMRT planning, MLC's, dose calculation. IMRT planning is medically necessary due to the close proximity of the tumor to adjacent critical normal structures including the bowel and spinal cord.  3 dimensional simulation is performed and dose volume histogram of the gross tumor volume, planning tumor volume and criticial normal structures including the spinal cord and lungs were analyzed and requested.  Special treatment procedure was performed due to high dose per fraction.  The patient will be monitored for increased risk of toxicity.  Daily imaging using cone beam CT will be used for target localization.  I anticipate that the patient will receive 33 Gy in 5 fractions to target volume. Further adjustments will be made based on the planning process is necessary.   ------------------------------------------------

## 2019-07-15 NOTE — Progress Notes (Signed)
PROGRESS NOTE  Julie Rollins  DOB: 19-Apr-1946  PCP: Nolene Ebbs, MD [id number]  DOA: 07/14/2019  LOS: 1 day   Brief narrative: Julie Rollins is a 73 y.o. female with PMH of pancreatic cancer with partial gastric outlet obstruction on TPN at home. Patient presented to the ED on 07/14/2019 from home for fever and the TPN port not working.  Patient recently got discharged from SNF to home.  Home health RN came to check her TPN and family diet the port was not working.  Patient also had subjective fever.  RN called patient's oncologist and was suggested to come to the ED. In the ED, patient had a temperature of 100.4, sinus tachycardia, breathing comfortably. Blood work showed WBC count 5.7, hemoglobin 10.9, sodium 133, creatinine 0.52, lactic acid 2.3. Blood culture was obtained. Chest x-ray showed possible right lower lobe pneumonia. Patient was admitted for right lower lobe pneumonia and started on IV Rocephin and IV azithromycin.  Subjective: Patient was seen and examined this morning.  Pleasant elderly Caucasian female.  Not in distress.  Assessment/Plan:  Active Problems:   Carcinoma of head of pancreas (HCC)   Sepsis due to undetermined organism ([place])  Sepsis secondary to pneumonia -community-acquired versus aspiration -Presented with fever, tachycardia, lactic acidosis.   -Chest x-ray with right lower lobe pneumonia. -No area of cellulitis around the chest port site. -Blood culture and urine culture pending report. -Currently on IV Rocephin and IV Zithromax. -Continue to monitor.  Hyponatremia: -Sodium level low at 133, continue to monitor on IV fluids.    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 -port not working.  Currently TPN on hold.  Resume once port is secured.  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 Monitor  HTN -continue home meds cardizem with holding parameters  Hypothyroidism:  -continue synthroid  Mobility: Encourage ambulation Diet: Clear liquid diet DVT prophylaxis:  Lovenox Code Status:   Code Status: Full Code  Family Communication:  Expected Discharge:  Home in 2 to 3 days  Consultants:  Oncology  Procedures:    Antimicrobials: Anti-infectives (From admission, onward)   Start     Dose/Rate Route Frequency Ordered Stop   07/14/19 2130  cefTRIAXone (ROCEPHIN) 1 g in sodium chloride 0.9 % 100 mL IVPB     1 g 200 mL/hr over 30 Minutes Intravenous Every 24 hours 07/14/19 2123     07/14/19 2130  azithromycin (ZITHROMAX) 500 mg in sodium chloride 0.9 % 250 mL IVPB     500 mg 250 mL/hr over 60 Minutes Intravenous Every 24 hours 07/14/19 2123     07/14/19 1630  cefTRIAXone (ROCEPHIN) 1 g in sodium chloride 0.9 % 100 mL IVPB     1 g 200 mL/hr over 30 Minutes Intravenous  Once 07/14/19 1625 07/14/19 1952   07/14/19 1630  azithromycin (ZITHROMAX) 500 mg in sodium chloride 0.9 % 250 mL IVPB  500 mg 250 mL/hr over 60 Minutes Intravenous  Once 07/14/19 1625 07/14/19 1750      Infusions:  . sodium chloride 75 mL/hr at 07/15/19 0210  . azithromycin Stopped (07/14/19 2310)  . cefTRIAXone (ROCEPHIN)  IV 1 g (07/15/19 0213)    Scheduled Meds: . alteplase  2 mg Intracatheter Once  . diltiazem  180 mg Oral Daily  . enoxaparin (LOVENOX) injection  40 mg Subcutaneous QHS  . hydrocortisone   Rectal BID  .  levothyroxine  50 mcg Oral Q0600  . metoCLOPramide  5 mg Oral TID AC & HS  . mirtazapine  7.5 mg Oral QHS  . multivitamin with minerals  1 tablet Oral Daily  . pantoprazole  40 mg Oral BID    PRN meds: ondansetron   Objective: Vitals:   07/15/19 1000 07/15/19 1450  BP: (!) 111/52 (!) 114/54  Pulse: 91 85  Resp: 20 18  Temp: 99 F (37.2 C) 97.6 F (36.4 C)  SpO2: 98% 96%    Intake/Output Summary (Last 24 hours) at 07/15/2019 1459 Last data filed at 07/15/2019 0600 Gross per 24 hour  Intake 681.83 ml  Output 370 ml  Net 311.83 ml   Filed Weights   07/14/19 1412 07/14/19 2327  Weight: 54.4 kg 57.3 kg   Weight change:  Body mass index is 21.68 kg/m.   Physical Exam: General exam: Appears calm and comfortable.  Skin: No rashes, lesions or ulcers. HEENT: Atraumatic, normocephalic, supple neck, no obvious bleeding Lungs: Clear to auscultation bilaterally CVS: Regular rate and rhythm, no murmur GI/Abd soft, nontender, nondistended, bowel sound present CNS: Alert, awake, oriented x3 Psychiatry: Mood appropriate Extremities: No pedal edema, no calf tenderness  Data Review: I have personally reviewed the laboratory data and studies available.  Recent Labs  Lab 07/14/19 1506 07/15/19 0031  WBC 5.6 7.5  NEUTROABS 4.7  --   HGB 10.9* 9.7*  HCT 33.2* 30.4*  MCV 94.6 97.7  PLT 343 201   Recent Labs  Lab 07/14/19 1544  NA 133*  K 4.7  CL 103  CO2 22  GLUCOSE 151*  BUN 16  CREATININE 0.52  CALCIUM 7.7*    Terrilee Croak, MD  [facility] 07/15/2019

## 2019-07-15 NOTE — Progress Notes (Signed)
Removed TPA from port  at 2030 and no blood return, port reaccess and still no blood return. ED RN was notified.

## 2019-07-15 NOTE — Progress Notes (Signed)
  Radiation Oncology         [phone] ________________________________  Name: Julie Rollins MRN: [id number]  Date: 06/21/2019  DOB: 1946/10/25  RESPIRATORY MOTION MANAGEMENT SIMULATION  NARRATIVE:  In order to account for effect of respiratory motion on target structures and other organs in the planning and delivery of radiotherapy, this patient underwent respiratory motion management simulation.  To accomplish this, when the patient was brought to the CT simulation planning suite, 4D respiratoy motion management CT images were obtained.  The CT images were loaded into the planning software.  Then, using a variety of tools including Cine, MIP, and standard views, the target volume and planning target volumes (PTV) were delineated.  Avoidance structures were contoured.  Treatment planning then occurred.  Dose volume histograms were generated and reviewed for each of the requested structure.  The resulting plan was carefully reviewed and approved today.   ------------------------------------------------  Jodelle Gross, MD, PhD

## 2019-07-15 NOTE — Progress Notes (Signed)
38mL blood/tPA withdrawn after 3:45 dwell time. No further blood return after flushing.  Note this was the second dose, also tPA'd on 8/20. Consider overnight dwell or radiology evaluation.  Please notify MD for further orders.

## 2019-07-15 NOTE — Progress Notes (Addendum)
Julie Rollins   DOB:May 31, 1946   ZO#:[id number]   [id number]#:[id number]  Oncology follow up   Subjective: Has intermittent fevers. Reports that she is feeling better today. On clear liquids. States that broth make her have some nausea. Denies chest pain, vomiting, abdominal pain. No bleeding.    Objective:  Vitals:   07/15/19 0413 07/15/19 1000  BP: (!) 106/53 (!) 111/52  Pulse: 97 91  Resp: 20 20  Temp: 98 F (36.7 C) 99 F (37.2 C)  SpO2: 96% 98%    Body mass index is 21.68 kg/m.  Intake/Output Summary (Last 24 hours) at 07/15/2019 1422 Last data filed at 07/15/2019 0600 Gross per 24 hour  Intake 681.83 ml  Output 370 ml  Net 311.83 ml     Sclerae unicteric  Skin: no rash. Port site is clean, no skin erythema or discharge.  No peripheral adenopathy  Lungs clear -- no rales or rhonchi  Heart regular rate and rhythm  Abdomen benign  MSK no focal spinal tenderness, no peripheral edema  Neuro nonfocal    CBG (last 3)  No results for input(s): GLUCAP in the last 72 hours.   Labs:  Lab Results  Component Value Date   WBC 7.5 07/15/2019   HGB 9.7 (L) 07/15/2019   HCT 30.4 (L) 07/15/2019   MCV 97.7 07/15/2019   PLT 201 07/15/2019   NEUTROABS 4.7 07/14/2019    Urine Studies No results for input(s): UHGB, CRYS in the last 72 hours.  Invalid input(s): UACOL, UAPR, USPG, UPH, UTP, UGL, UKET, UBIL, UNIT, UROB, ULEU, UEPI, UWBC, URBC, UBAC, CAST, UCOM, BILUA  Basic Metabolic Panel: Recent Labs  Lab 07/14/19 1544  NA 133*  K 4.7  CL 103  CO2 22  GLUCOSE 151*  BUN 16  CREATININE 0.52  CALCIUM 7.7*   GFR Estimated Creatinine Clearance: 52.5 mL/min (by C-G formula based on SCr of 0.52 mg/dL). Liver Function Tests: Recent Labs  Lab 07/14/19 1544  AST 45*  ALT 20  ALKPHOS 102  BILITOT 1.5*  PROT 5.4*  ALBUMIN 2.2*   Recent Labs  Lab 07/14/19 1544  LIPASE 45   No results for input(s): AMMONIA in the last 168 hours. Coagulation profile No results for  input(s): INR, PROTIME in the last 168 hours.  CBC: Recent Labs  Lab 07/14/19 1506 07/15/19 0031  WBC 5.6 7.5  NEUTROABS 4.7  --   HGB 10.9* 9.7*  HCT 33.2* 30.4*  MCV 94.6 97.7  PLT 343 201   Cardiac Enzymes: No results for input(s): CKTOTAL, CKMB, CKMBINDEX, TROPONINI in the last 168 hours. BNP: Invalid input(s): POCBNP CBG: No results for input(s): GLUCAP in the last 168 hours. D-Dimer No results for input(s): DDIMER in the last 72 hours. Hgb A1c No results for input(s): HGBA1C in the last 72 hours. Lipid Profile No results for input(s): CHOL, HDL, LDLCALC, TRIG, CHOLHDL, LDLDIRECT in the last 72 hours. Thyroid function studies No results for input(s): TSH, T4TOTAL, T3FREE, THYROIDAB in the last 72 hours.  Invalid input(s): FREET3 Anemia work up No results for input(s): VITAMINB12, FOLATE, FERRITIN, TIBC, IRON, RETICCTPCT in the last 72 hours. Microbiology Recent Results (from the past 240 hour(s))  Culture, blood (routine x 2)     Status: None (Preliminary result)   Collection Time: 07/14/19  2:47 PM   Specimen: BLOOD  Result Value Ref Range Status   Specimen Description   Final    BLOOD RIGHT HAND Performed at [facility] [address]  [place], [place] [address]    Special Requests   Final    BOTTLES DRAWN AEROBIC AND ANAEROBIC Blood Culture adequate volume Performed at [facility] [address], [place], [place] [address]    Culture   Final    NO GROWTH < 24 HOURS Performed at [facility] [address] [place], [place] [address]    Report Status PENDING  Incomplete  SARS Coronavirus 2 [facility] order, Performed in [facility] hospital lab) Nasopharyngeal Nasopharyngeal Swab     Status: None   Collection Time: 07/14/19  3:23 PM   Specimen: Nasopharyngeal Swab  Result Value Ref Range Status   SARS Coronavirus 2 NEGATIVE NEGATIVE Final    Comment: (NOTE) If result is NEGATIVE SARS-CoV-2 target nucleic  acids are NOT DETECTED. The SARS-CoV-2 RNA is generally detectable in upper and lower  respiratory specimens during the acute phase of infection. The lowest  concentration of SARS-CoV-2 viral copies this assay can detect is 250  copies / mL. A negative result does not preclude SARS-CoV-2 infection  and should not be used as the sole basis for treatment or other  patient management decisions.  A negative result may occur with  improper specimen collection / handling, submission of specimen other  than nasopharyngeal swab, presence of viral mutation(s) within the  areas targeted by this assay, and inadequate number of viral copies  (<250 copies / mL). A negative result must be combined with clinical  observations, patient history, and epidemiological information. If result is POSITIVE SARS-CoV-2 target nucleic acids are DETECTED. The SARS-CoV-2 RNA is generally detectable in upper and lower  respiratory specimens dur ing the acute phase of infection.  Positive  results are indicative of active infection with SARS-CoV-2.  Clinical  correlation with patient history and other diagnostic information is  necessary to determine patient infection status.  Positive results do  not rule out bacterial infection or co-infection with other viruses. If result is PRESUMPTIVE POSTIVE SARS-CoV-2 nucleic acids MAY BE PRESENT.   A presumptive positive result was obtained on the submitted specimen  and confirmed on repeat testing.  While 2019 novel coronavirus  (SARS-CoV-2) nucleic acids may be present in the submitted sample  additional confirmatory testing may be necessary for epidemiological  and / or clinical management purposes  to differentiate between  SARS-CoV-2 and other Sarbecovirus currently known to infect humans.  If clinically indicated additional testing with an alternate test  methodology [phone]) is advised. The SARS-CoV-2 RNA is generally  detectable in upper and lower respiratory  sp ecimens during the acute  phase of infection. The expected result is Negative. Fact Sheet for Patients:  StrictlyIdeas.no Fact Sheet for Healthcare Providers: BankingDealers.co.za This test is not yet approved or cleared by the [place] FDA and has been authorized for detection and/or diagnosis of SARS-CoV-2 by FDA under an Emergency Use Authorization (EUA).  This EUA will remain in effect (meaning this test can be used) for the duration of the COVID-19 declaration under Section 564(b)(1) of the Act, 21 U.S.C. section 360bbb-3(b)(1), unless the authorization is terminated or revoked sooner. Performed at [facility], [place] [address] [place], [place] [address]   MRSA PCR Screening     Status: None   Collection Time: 07/14/19  9:23 PM   Specimen: Nasal Mucosa; Nasopharyngeal  Result Value Ref Range Status   MRSA by PCR NEGATIVE NEGATIVE Final    Comment:        The GeneXpert MRSA Assay (FDA  approved for NASAL specimens only), is one component of a comprehensive MRSA colonization surveillance program. It is not intended to diagnose MRSA infection nor to guide or monitor treatment for MRSA infections. Performed at [facility], [place] [address], [place], [place] [address]   Culture, blood (routine x 2)     Status: None (Preliminary result)   Collection Time: 07/15/19 12:31 AM   Specimen: BLOOD  Result Value Ref Range Status   Specimen Description   Final    BLOOD RIGHT WRIST Performed at [facility] [address], [place], [place] [address]    Special Requests   Final    BOTTLES DRAWN AEROBIC AND ANAEROBIC Blood Culture adequate volume Performed at [facility] [address] [place], [place] [address]    Culture   Final    NO GROWTH < 12 HOURS Performed at [facility] [address] [place], [place] [address]    Report Status  PENDING  Incomplete      Studies:  Dg Chest Portable 1 View  Result Date: 07/14/2019 CLINICAL DATA:  Fever EXAM: PORTABLE CHEST 1 VIEW COMPARISON:  04/12/2019 FINDINGS: Left-sided central venous port tip over the SVC. No pleural effusion. Stable cardiomediastinal silhouette. Small foci of airspace disease in the right base. No pneumothorax IMPRESSION: 1. Small patchy foci of airspace disease at the right base are questionable for foci of infection or inflammation 2. The lung fields are otherwise clear Electronically Signed   By: Donavan Foil M.D.   On: 07/14/2019 16:09    Assessment: 73 y.o. female with   1. Fever and sepsis, ? RLL pneumonia  2.  Locally advanced pancreatic cancer with duodenal involvement. 3.  Partial gastric outlet obstruction due to pancreatic cancer 4.  Malnutrition, currently on TPN through port  5. Anemia 6. HTN  7.  Hypothyroidism 8. 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 Urine culture pending. Remains on IV antibiotics. -she is supposed to start PARP inhibitor Lonie Peak this weekend for her pancreatic cancer, will hold it for now.  -Hemoglobin down slightly, likely due to infection and dilutional. Monitor daily CBC. No transfusion indicated. -appreciate hospitalist team care  -I will f/u while she is in hospital    Mikey Bussing, NP 07/15/2019  2:22 PM   Addendum  I have seen the patient, examined her. I agree with the assessment and and plan and have edited the notes.   Pt is feeling better today, no recurrent fever since this morning.  She is able to drink some liquids.  She has no significant pain, nausea, cultures negative so far. Will continue f/u cultures and adjust antibiotics if needed. Her nurse is still try tPA in her port.   If her cultures remain to be negative over the weekend, she otherwise feels well, okay to discharge from my stand point. I will follow her in my clinic.I appreciate the hospitalist care.    Truitt Merle  07/15/2019

## 2019-07-15 NOTE — Progress Notes (Signed)
No blood return noted from port after 2 hour dwell time. Left to dwell, will recheck later.

## 2019-07-15 NOTE — TOC Initial Note (Signed)
Transition of Care [facility]) - Initial/Assessment Note    Patient Details  Name: Julie Rollins MRN: [id number] Date of Birth: August 17, 1946  Transition of [facility]) CM/SW Contact:    Erenest Rasher, RN Phone Number: 07/15/2019, 2:01 PM  Clinical Narrative:                 Received call from [facility], [facility]. Pt is active for TPN and Alvis Lemmings is [facility] agency. TOC CM/CSW will continue to follow for dc needs.   Expected Discharge Plan: [place] Barriers to Discharge: Continued Medical Work up   Patient Goals and CMS Choice        Expected Discharge Plan and Services Expected Discharge Plan: [place]                                              Prior Living Arrangements/Services                  Current home services: Home RN([facility], [facility])    Activities of Daily Living Home Assistive Devices/Equipment: None ADL Screening (condition at time of admission) Patient's cognitive ability adequate to safely complete daily activities?: Yes Is the patient deaf or have difficulty hearing?: No Does the patient have difficulty seeing, even when wearing glasses/contacts?: No Does the patient have difficulty concentrating, remembering, or making decisions?: No Patient able to express need for assistance with ADLs?: Yes Does the patient have difficulty dressing or bathing?: No Independently performs ADLs?: Yes (appropriate for developmental age) Does the patient have difficulty walking or climbing stairs?: No Weakness of Legs: None Weakness of Arms/Hands: None  Permission Sought/Granted   Permission granted to share information with : Yes, Verbal Permission Granted              Emotional Assessment           Psych Involvement: No (comment)  Admission diagnosis:  Malignant neoplasm of pancreas, unspecified location of malignancy ([place]) [C25.9] Fever, unspecified fever cause [R50.9] Patient Active  Problem List   Diagnosis Date Noted  . Sepsis due to undetermined organism ([place]) 07/14/2019  . Hyponatremia   . Fever   . Malnutrition of moderate degree 06/22/2019  . Encounter for nasogastric (NG) tube placement   . Partial gastric outlet obstruction   . Intractable nausea and vomiting 06/13/2019  . Genetic testing 06/08/2019  . BRCA2 gene mutation positive in female 06/08/2019  . Family history of bladder cancer   . Family history of stomach cancer   . Family history of throat cancer   . Personal history of breast cancer   . Port-A-Cath in place 04/27/2019  . Carcinoma of head of pancreas ([place]) 04/04/2019  . Pancreatic mass 03/28/2019  . Elevated LFTs 03/28/2019  . Hypothyroidism 03/28/2019  . Diarrhea 03/28/2019  . Generalized weakness   . Jaundice   . Abnormal finding on GI tract imaging   . Elevated alkaline phosphatase level   . Cervical spine fracture ([place]) 07/31/2018   PCP:  Nolene Ebbs, MD Pharmacy:   [facility] [place], [place] [address] [place] [address] Phone: [phone] Fax: [place]), [place] - [address]  ([place])  [address] Phone: [phone] Fax: [phone]  [place], [place] [place] [address] Phone: [phone] Fax: [phone]     Social Determinants of Health (SDOH) Interventions    Readmission Risk Interventions No flowsheet data found.

## 2019-07-15 NOTE — Progress Notes (Signed)
[place] Radiation Oncology Simulation and Treatment Planning Note   Name:  Julie Rollins MRN: [id number]   Date: 06/21/2019  DOB: 11/13/1946  Status:outpatient    DIAGNOSIS:    ICD-10-CM   1. Carcinoma of head of pancreas ([place])  C25.0      CONSENT VERIFIED:yes   SET UP: Patient is setup supine   IMMOBILIZATION: The patient was immobilized using a Vac Loc bag and Accuform device.   NARRATIVE: The patient had a feeding tube placed after her simulation last week. To minimize dose to this area, the patient was scheduled for a RE-SIMULATION to account for this change in anatomy.  The patient was brought to the [facility].  Identity was confirmed.  All relevant records and images related to the planned course of therapy were reviewed.  Then, the patient was positioned in a stable reproducible clinical set-up for radiation therapy. Abdominal compression was applied .  4D CT images were obtained and reproducible breathing pattern was confirmed. Free breathing CT images were obtained.  Skin markings were placed.  The CT images were loaded into the planning software where the target and avoidance structures were contoured.  The radiation prescription was entered and confirmed.    TREATMENT PLANNING NOTE:  Treatment planning then occurred. I have requested : IMRT planning, MLC's, dose calculation. IMRT planning is medically necessary due to the close proximity of the tumor to adjacent critical normal structures including the bowel and spinal cord.  3 dimensional simulation is performed and dose volume histogram of the gross tumor volume, planning tumor volume and criticial normal structures including the spinal cord and lungs were analyzed and requested.  Special treatment procedure was performed due to high dose per fraction.  The patient will be monitored for increased risk of toxicity.  Daily imaging using cone beam CT will be used for target  localization.  I anticipate that the patient will receive 33 Gy in 5 fractions to target volume. Further adjustments will be made based on the planning process is necessary.  ------------------------------------------------

## 2019-07-15 NOTE — Progress Notes (Signed)
  Radiation Oncology         [phone] ________________________________  Name: Julie Rollins MRN: [id number]  Date: 06/16/2019  DOB: Apr 01, 1946  RESPIRATORY MOTION MANAGEMENT SIMULATION  NARRATIVE:  In order to account for effect of respiratory motion on target structures and other organs in the planning and delivery of radiotherapy, this patient underwent respiratory motion management simulation.  To accomplish this, when the patient was brought to the CT simulation planning suite, 4D respiratoy motion management CT images were obtained.  The CT images were loaded into the planning software.  Then, using a variety of tools including Cine, MIP, and standard views, the target volume and planning target volumes (PTV) were delineated.  Avoidance structures were contoured.  Treatment planning then occurred.  Dose volume histograms were generated and reviewed for each of the requested structure.  The resulting plan was carefully reviewed and approved today.   ------------------------------------------------  Jodelle Gross, MD, PhD

## 2019-07-15 NOTE — Progress Notes (Signed)
Port assessed, no blood return noted. Site clean dry and intact. No erythema or edema noted.  May need second dose of alteplase (requires MD order) or a dye study in IR to evaluate line.

## 2019-07-15 NOTE — Progress Notes (Signed)
TPA (second dose) instilled to port per MD order. Will recheck after 2 hours.

## 2019-07-16 LAB — BLOOD CULTURE ID PANEL (REFLEXED)

## 2019-07-16 LAB — PROCALCITONIN: Procalcitonin: 0.32 ng/mL

## 2019-07-16 MED ORDER — VANCOMYCIN HCL IN DEXTROSE 1-5 GM/200ML-% IV SOLN
1000.0000 mg | INTRAVENOUS | Status: DC
  Administered 2019-07-16 – 2019-07-20 (×5): 1000 mg via INTRAVENOUS
  Filled 2019-07-16 (×5): qty 200

## 2019-07-16 NOTE — Progress Notes (Signed)
Per patient, needle in accessed port was not changed from 06/21/19 (previous hospitalization) until last week (possibly Wednesday 06/12/19). Pt states she asked facility (rehab) to change the needle and they told her it was not their policy. MD Dahal made aware. MD also made aware that port is currently functioning properly per IV Team.

## 2019-07-16 NOTE — Progress Notes (Signed)
Upon routine flushing of port, it was noted that port now has great blood return. Marolyn Hammock, RN made aware. May still want to have IR study completed on port. Will continue to monitor.

## 2019-07-16 NOTE — Progress Notes (Signed)
Pharmacy Antibiotic Note  Julie Rollins is a 73 y.o. female admitted on 07/14/2019 with fever and Port not working. MRSA detected in 1 bottle of blood culture, pt immunocompromised.  Vancomycin per pharmacy.  Plan: Vancomycin 1gm IV q24h (AUC 505.5, used Scr 0.8) Follow renal function, clinical course and cultures  Height: 5\' 4"  (162.6 cm) Weight: 126 lb 5.2 oz (57.3 kg) IBW/kg (Calculated) : 54.7  Temp (24hrs), Avg:98 F (36.7 C), Min:97.5 F (36.4 C), Max:98.9 F (37.2 C)  Recent Labs  Lab 07/14/19 1506 07/14/19 1509 07/14/19 1544 07/14/19 1659 07/15/19 0031  WBC 5.6  --   --   --  7.5  CREATININE  --   --  0.52  --   --   LATICACIDVEN  --  2.3*  --  1.5  --     Estimated Creatinine Clearance: 52.5 mL/min (by C-G formula based on SCr of 0.52 mg/dL).    Allergies  Allergen Reactions  . Codeine Nausea Only    Antimicrobials this admission: 8/20 azith >> 8/22 8/20 CTX >> 8/22 8/22 vanc >> Dose adjustments this admission:   Microbiology results:  8/21 BCx: GPC, staph species, MecA+ 8/20 UCx: NGF 8/22 BCx:  8/20 MRSA PCR: neg  Thank you for allowing pharmacy to be a part of this patient's care.  Dolly Rias RPh 07/16/2019, 2:46 PM Pager [phone]

## 2019-07-16 NOTE — Progress Notes (Signed)
PROGRESS NOTE  Julie Rollins  DOB: 02-08-1946  PCP: Nolene Ebbs, MD [id number]  DOA: 07/14/2019  LOS: 2 days   Brief narrative: Julie Rollins is a 73 y.o. female with PMH of pancreatic cancer with partial gastric outlet obstruction on TPN at home. Patient presented to the ED on 07/14/2019 from home for fever and the TPN port not working.  Patient recently got discharged from SNF to home.  Home health RN came to check her TPN and found that the port was not working.  Patient also had subjective fever.  RN called patient's oncologist and was suggested to come to the ED. In the ED, patient had a temperature of 100.4, sinus tachycardia, breathing comfortably. Blood work showed WBC count 5.7, hemoglobin 10.9, sodium 133, creatinine 0.52, lactic acid 2.3. Blood culture was obtained. Chest x-ray showed possible right lower lobe pneumonia. Patient was admitted for right lower lobe pneumonia and started on IV Rocephin and IV azithromycin.  Subjective: Patient was seen and examined this morning.  Pleasant elderly Caucasian female.  Not in distress.  Assessment/Plan:  Active Problems:   Carcinoma of head of pancreas (HCC)   Sepsis due to undetermined organism ([place])  Sepsis secondary to pneumonia -community-acquired versus aspiration -Presented with fever, tachycardia, lactic acidosis.   -Chest x-ray with right lower lobe pneumonia. -No area of cellulitis around the chest port site. -Blood culture and urine culture -NGTD -Currently on IV Rocephin and IV Zithromax. -Continue to monitor.  No fever, WBC count normal so far.  Hyponatremia: -Sodium level low at 133, continue to monitor on IV fluids.    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 -port not working. Currently TPN on hold. Resume TPN once port is secured.  -Advance from clear liquid diet to full liquid diet today.  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  9.7 today.  Monitor.  HTN -continue cardizem with holding parameters  Hypothyroidism:  -continue synthroid  Mobility: Encourage ambulation Diet: Clear liquid diet DVT prophylaxis:  Lovenox Code Status:   Code Status: Full Code  Family Communication:  Expected Discharge:  Home in 2 to 3 days  Consultants:  Oncology  Procedures:    Antimicrobials: Anti-infectives (From admission, onward)   Start     Dose/Rate Route Frequency Ordered Stop   07/14/19 2130  cefTRIAXone (ROCEPHIN) 1 g in sodium chloride 0.9 % 100 mL IVPB     1 g 200 mL/hr over 30 Minutes Intravenous Every 24 hours 07/14/19 2123     07/14/19 2130  azithromycin (ZITHROMAX) 500 mg in sodium chloride 0.9 % 250 mL IVPB     500 mg 250 mL/hr over 60 Minutes Intravenous Every 24 hours 07/14/19 2123     07/14/19 1630  cefTRIAXone (ROCEPHIN) 1 g in sodium chloride 0.9 % 100 mL IVPB     1 g 200 mL/hr over 30 Minutes Intravenous  Once 07/14/19 1625 07/14/19 1952  07/14/19 1630  azithromycin (ZITHROMAX) 500 mg in sodium chloride 0.9 % 250 mL IVPB     500 mg 250 mL/hr over 60 Minutes Intravenous  Once 07/14/19 1625 07/14/19 1750      Infusions:  . azithromycin Stopped (07/16/19 0000)  . cefTRIAXone (ROCEPHIN)  IV Stopped (07/15/19 1900)    Scheduled Meds: . diltiazem  180 mg Oral Daily  . enoxaparin (LOVENOX) injection  40 mg Subcutaneous QHS  . hydrocortisone    Rectal BID  . levothyroxine  50 mcg Oral Q0600  . metoCLOPramide  5 mg Oral TID AC & HS  . mirtazapine  7.5 mg Oral QHS  . multivitamin with minerals  1 tablet Oral Daily  . pantoprazole  40 mg Oral BID    PRN meds: ondansetron   Objective: Vitals:   07/15/19 2146 07/16/19 0603  BP: 131/63 (!) 114/58  Pulse: 95 87  Resp: 16 16  Temp: 98.9 F (37.2 C) (!) 97.5 F (36.4 C)  SpO2: 97% 96%    Intake/Output Summary (Last 24 hours) at 07/16/2019 1348 Last data filed at 07/15/2019 1900 Gross per 24 hour  Intake 1000 ml  Output -  Net 1000 ml   Filed Weights   07/14/19 1412 07/14/19 2327  Weight: 54.4 kg 57.3 kg   Weight change:  Body mass index is 21.68 kg/m.   Physical Exam: General exam: Appears calm and comfortable.  Skin: No rashes, lesions or ulcers. HEENT: Atraumatic, normocephalic, supple neck,  Lungs: Clear to auscultation bilaterally CVS: Regular rate and rhythm, no murmur GI/Abd soft, nontender, nondistended, bowel sound present CNS: Alert, awake, oriented x3 Psychiatry: Mood appropriate Extremities: No pedal edema, no calf tenderness  Data Review: I have personally reviewed the laboratory data and studies available.  Recent Labs  Lab 07/14/19 1506 07/15/19 0031  WBC 5.6 7.5  NEUTROABS 4.7  --   HGB 10.9* 9.7*  HCT 33.2* 30.4*  MCV 94.6 97.7  PLT 343 201   Recent Labs  Lab 07/14/19 1544  NA 133*  K 4.7  CL 103  CO2 22  GLUCOSE 151*  BUN 16  CREATININE 0.52  CALCIUM 7.7*    Terrilee Croak, MD  [facility] 07/16/2019

## 2019-07-16 NOTE — Progress Notes (Signed)
MRSA detected in 1 bottle of blood culture. Contamination versus true infection. Because of the fact the patient is immunocompromised, is a indwelling port and was septic at presentation, this is likely to be true infection as well. Patient is currently not on any antibiotics.  We will obtain a repeat set of blood culture.  We will start the patient empirically on IV vancomycin till the culture report is finalized.  Additionally, port not functional yet.  IR to try port injection and manipulation on Monday.

## 2019-07-17 ENCOUNTER — Inpatient Hospital Stay (HOSPITAL_COMMUNITY): Payer: Medicare HMO

## 2019-07-17 LAB — COMPREHENSIVE METABOLIC PANEL
ALT: 26 U/L (ref 0–44)
AST: 26 U/L (ref 15–41)
Albumin: 2.3 g/dL — ABNORMAL LOW (ref 3.5–5.0)
Alkaline Phosphatase: 102 U/L (ref 38–126)
Anion gap: 6 (ref 5–15)
BUN: 7 mg/dL — ABNORMAL LOW (ref 8–23)
CO2: 24 mmol/L (ref 22–32)
Calcium: 8.3 mg/dL — ABNORMAL LOW (ref 8.9–10.3)
Chloride: 108 mmol/L (ref 98–111)
Creatinine, Ser: 0.45 mg/dL (ref 0.44–1.00)
GFR calc Af Amer: >60 mL/min (ref >60)
GFR calc non Af Amer: >60 mL/min (ref >60)
Glucose, Bld: 211 mg/dL — ABNORMAL HIGH (ref 70–99)
Potassium: 2.8 mmol/L — ABNORMAL LOW (ref 3.5–5.1)
Sodium: 138 mmol/L (ref 135–145)
Total Bilirubin: 0.4 mg/dL (ref 0.3–1.2)
Total Protein: 5.5 g/dL — ABNORMAL LOW (ref 6.5–8.1)

## 2019-07-17 LAB — CBC WITH DIFFERENTIAL/PLATELET
Abs Immature Granulocytes: 0.01 10*3/uL (ref 0.00–0.07)
Basophils Absolute: 0 10*3/uL (ref 0.0–0.1)
Basophils Relative: 0 %
Eosinophils Absolute: 0.1 10*3/uL (ref 0.0–0.5)
Eosinophils Relative: 4 %
HCT: 28.7 % — ABNORMAL LOW (ref 36.0–46.0)
Hemoglobin: 9.1 g/dL — ABNORMAL LOW (ref 12.0–15.0)
Immature Granulocytes: 0 %
Lymphocytes Relative: 29 %
Lymphs Abs: 0.8 10*3/uL (ref 0.7–4.0)
MCH: 30.2 pg (ref 26.0–34.0)
MCHC: 31.7 g/dL (ref 30.0–36.0)
MCV: 95.3 fL (ref 80.0–100.0)
Monocytes Absolute: 0.3 10*3/uL (ref 0.1–1.0)
Monocytes Relative: 12 %
Neutro Abs: 1.5 10*3/uL — ABNORMAL LOW (ref 1.7–7.7)
Neutrophils Relative %: 55 %
Platelets: 215 10*3/uL (ref 150–400)
RBC: 3.01 MIL/uL — ABNORMAL LOW (ref 3.87–5.11)
RDW: 13.9 % (ref 11.5–15.5)
WBC: 2.8 10*3/uL — ABNORMAL LOW (ref 4.0–10.5)
nRBC: 0 % (ref 0.0–0.2)

## 2019-07-17 MED ORDER — POTASSIUM CHLORIDE 10 MEQ/100ML IV SOLN
10.0000 meq | INTRAVENOUS | Status: AC
  Administered 2019-07-17 (×2): 10 meq via INTRAVENOUS
  Filled 2019-07-17 (×2): qty 100

## 2019-07-17 MED ORDER — PIPERACILLIN-TAZOBACTAM 3.375 G IVPB
3.3750 g | Freq: Three times a day (TID) | INTRAVENOUS | Status: DC
  Administered 2019-07-17 – 2019-07-21 (×12): 3.375 g via INTRAVENOUS
  Filled 2019-07-17 (×12): qty 50

## 2019-07-17 MED ORDER — POTASSIUM CHLORIDE CRYS ER 20 MEQ PO TBCR
40.0000 meq | EXTENDED_RELEASE_TABLET | Freq: Once | ORAL | Status: AC
  Administered 2019-07-17: 40 meq via ORAL
  Filled 2019-07-17: qty 2

## 2019-07-17 NOTE — Progress Notes (Signed)
Pharmacy Antibiotic Note  Julie Rollins is a 73 y.o. female admitted on 07/14/2019 with fever and port not working. Pt started on vanc for MRSA.  Now pharmacy consulted to dose zosyn for aspiration pneumonia.   Plan: Zosyn 3.375g IV Q8H infused over 4hrs. Continue vanc 1gm IV q24h (AUC 505.5, used Scr 0.8) Daily Scr Follow renal function, cultures and clinical course  Height: 5\' 4"  (162.6 cm) Weight: 126 lb 5.2 oz (57.3 kg) IBW/kg (Calculated) : 54.7  Temp (24hrs), Avg:97.8 F (36.6 C), Min:97.5 F (36.4 C), Max:97.9 F (36.6 C)  Recent Labs  Lab 07/14/19 1506 07/14/19 1509 07/14/19 1544 07/14/19 1659 07/15/19 0031  WBC 5.6  --   --   --  7.5  CREATININE  --   --  0.52  --   --   LATICACIDVEN  --  2.3*  --  1.5  --     Estimated Creatinine Clearance: 52.5 mL/min (by C-G formula based on SCr of 0.52 mg/dL).    Allergies  Allergen Reactions  . Codeine Nausea Only    Antimicrobials this admission: 8/20 azith >> 8/22 8/20 CTX >> 8/22 8/22 vanc >> 8/23 zosyn >> Dose adjustments this admission:   Microbiology results:  8/21 BCx: GPC, staph species, MecA+ 8/20 UCx: NGF 8/22 BCx:  8/20 MRSA PCR: neg Thank you for allowing pharmacy to be a part of this patient's care.  Dolly Rias RPh 07/17/2019, 3:35 PM

## 2019-07-17 NOTE — Progress Notes (Signed)
PROGRESS NOTE    Julie Rollins  [id number] DOB: 1946/11/05 DOA: 07/14/2019 PCP: Nolene Ebbs, MD    Brief Narrative:   73 year old female with prior history of pancreatic cancer , s/p partial gastric outlet obstruction on TPN at home, hypertension, hypothyroidism, anemia of chronic disease to ED for fever and left-sided not working.  On arrival to ED she was found to have right lower lobe pneumonia, she was started on IV Rocephin and azithromycin. Her blood cultures grew coag neg staph in one set of blood cultures and antibiotics changed to IV vancomycin and IV zosyn.    Assessment & Plan:   Active Problems:   Carcinoma of head of pancreas ([place])   Sepsis due to undetermined organism ([place])   Sepsis secondary to community acquired pneumonia vs aspiration pneumonia.  Appears to be improving with IV antibiotics.  Pt received IV Rocephin and IV Zithromax on 8/20 to 8/21, antibiotics were changed to IV vancomycin ( blood cultures were positive for coag neg staph) on 07/16/2019 and IV zosyn added on 07/17/2019 for gram neg coverage and anaerobes. SLP Evaluation ordered to check for aspiration/ dysphagia.  Repeat CXR ordered to evaluate the aspiration pneumonia.  Pt is on RA with good sats. She has been afebrile so far. Recheck labs today.  Pro calcitonin is 0.32. UA IS NEGATIVE.  Repeat blood cultures ordered and are negative so far.   Coag negative staph bacteremia, positive in one set of blood cultures , in both aerobic and anaerobic bottles drawn on 8/21 at 12:31 am.  In view of her immunocompromised state, with IV port on TPN, we will go ahead and treat it as true infection. Repeated blood cultures.  Requested microbiology for sensitivities and please call ID in am for further recommendations.    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  SLP Evaluation ordered.    Hyponatremia:  Suspect from dehydration.  Pt received IV hydration and repeat labs today.    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  - s/p neo adjuvant chemo for 2 cycles, later on stopped as she is not able to tolerate.  - she completed SBRT therapy from 8/4 till 8/14.  - further management as per oncology outpatient   Hypertension:  Well controlled   Hypothyroidism:  Continue with synthroid.   Anemia of chronic disease and malignancy.  Stable around 9. Please transfuse to keep it greater than 7.       DVT prophylaxis: lovenox.  Code Status: full liquid diet.  Family Communication: none at bedside.  Disposition Plan: pending clinical improvement.    Consultants:  None. .   Procedures: none.   Antimicrobials: IV VANCOMYCIN FROM 07/16/2019 Iv rocephin and IV zithromax from 8/21 till 8/22.  Subjective: No chest pain or sob.  Some nausea, no vomiting or abdominal pain.   Objective: Vitals:   07/16/19 2053 07/17/19 0558 07/17/19 1150 07/17/19 1255  BP: (!) 116/52 (!) 118/58  139/64  Pulse: 87 79  89  Resp: 19 16  18   Temp: 97.9 F (36.6 C) (!) 97.5 F (36.4 C)  97.9 F (36.6 C)  TempSrc: Oral Oral  Oral  SpO2: 97% 97% 97% 99%  Weight:      Height:        Intake/Output Summary (Last 24 hours) at  07/17/2019 1345 Last data filed at 07/17/2019 1256 Gross per 24 hour  Intake 240 ml  Output -  Net 240 ml   Filed Weights   07/14/19 1412 07/14/19 2327  Weight: 54.4 kg 57.3 kg    Examination:  General exam: Appears calm and comfortable  Respiratory system: Clear to auscultation. Respiratory effort normal. Port on the left chest wall, without any signs of cellulitis.  Cardiovascular system: S1 & S2 heard, RRR.  Gastrointestinal system: Abdomen is soft , no tenderness, bowel sounds good.  Central nervous system: Alert and oriented.  Grossly non focal. .  Extremities: no pedal edema or cyanosis.  Skin: No rashes, lesions or ulcers Psychiatry: . Mood & affect appropriate.     Data Reviewed: I have personally reviewed following labs and imaging studies  CBC: Recent Labs  Lab 07/14/19 1506 07/15/19 0031  WBC 5.6 7.5  NEUTROABS 4.7  --   HGB 10.9* 9.7*  HCT 33.2* 30.4*  MCV 94.6 97.7  PLT 343 123456   Basic Metabolic Panel: Recent Labs  Lab 07/14/19 1544  NA 133*  K 4.7  CL 103  CO2 22  GLUCOSE 151*  BUN 16  CREATININE 0.52  CALCIUM 7.7*   GFR: Estimated Creatinine Clearance: 52.5 mL/min (by C-G formula based on SCr of 0.52 mg/dL). Liver Function Tests: Recent Labs  Lab 07/14/19 1544  AST 45*  ALT 20  ALKPHOS 102  BILITOT 1.5*  PROT 5.4*  ALBUMIN 2.2*   Recent Labs  Lab 07/14/19 1544  LIPASE 45   No results for input(s): AMMONIA in the last 168 hours. Coagulation Profile: No results for input(s): INR, PROTIME in the last 168 hours. Cardiac Enzymes: No results for input(s): CKTOTAL, CKMB, CKMBINDEX, TROPONINI in the last 168 hours. BNP (last 3 results) No results for input(s): PROBNP in the last 8760 hours. HbA1C: No results for input(s): HGBA1C in the last 72 hours. CBG: No results for input(s): GLUCAP in the last 168 hours. Lipid Profile: No results for input(s): CHOL, HDL, LDLCALC, TRIG, CHOLHDL, LDLDIRECT in the last 72 hours. Thyroid Function Tests: No results for input(s): TSH, T4TOTAL, FREET4, T3FREE, THYROIDAB in the last 72 hours. Anemia Panel: No results for input(s): VITAMINB12, FOLATE, FERRITIN, TIBC, IRON, RETICCTPCT in the last 72 hours. Sepsis Labs: Recent Labs  Lab 07/14/19 1509 07/14/19 1659 07/15/19 0031 07/16/19 0521  PROCALCITON  --   --  0.43 0.32  LATICACIDVEN 2.3* 1.5  --   --     Recent Results (from the past 240 hour(s))  Culture, blood (routine x 2)     Status: None (Preliminary result)   Collection Time: 07/14/19  2:47 PM   Specimen: BLOOD  Result Value Ref Range  Status   Specimen Description   Final    BLOOD RIGHT HAND Performed at [facility], [place] [address] [place], [place] [address]    Special Requests   Final    BOTTLES DRAWN AEROBIC AND ANAEROBIC Blood Culture adequate volume Performed at [place] [address], [place], [place] [address]    Culture   Final    NO GROWTH 3 DAYS Performed at [facility], [place] [address] [place], [place] [address]    Report Status PENDING  Incomplete  SARS Coronavirus 2 [facility] order, Performed in [facility] hospital lab) Nasopharyngeal Nasopharyngeal Swab     Status: None   Collection Time: 07/14/19  3:23 PM   Specimen: Nasopharyngeal Swab  Result Value Ref Range  Status   SARS Coronavirus 2 NEGATIVE NEGATIVE Final    Comment: (NOTE) If result is NEGATIVE SARS-CoV-2 target nucleic acids are NOT DETECTED. The SARS-CoV-2 RNA is generally detectable in upper and lower  respiratory specimens during the acute phase of infection. The lowest  concentration of SARS-CoV-2 viral copies this assay can detect is 250  copies / mL. A negative result does not preclude SARS-CoV-2 infection  and should not be used as the sole basis for treatment or other  patient management decisions.  A negative result may occur with  improper specimen collection / handling, submission of specimen other  than nasopharyngeal swab, presence of viral mutation(s) within the  areas targeted by this assay, and inadequate number of viral copies  (<250 copies / mL). A negative result must be combined with clinical  observations, patient history, and epidemiological information. If result is POSITIVE SARS-CoV-2 target nucleic acids are DETECTED. The SARS-CoV-2 RNA is generally detectable in upper and lower  respiratory specimens dur ing the acute phase of infection.  Positive  results are indicative of active infection with SARS-CoV-2.  Clinical  correlation with patient history and  other diagnostic information is  necessary to determine patient infection status.  Positive results do  not rule out bacterial infection or co-infection with other viruses. If result is PRESUMPTIVE POSTIVE SARS-CoV-2 nucleic acids MAY BE PRESENT.   A presumptive positive result was obtained on the submitted specimen  and confirmed on repeat testing.  While 2019 novel coronavirus  (SARS-CoV-2) nucleic acids may be present in the submitted sample  additional confirmatory testing may be necessary for epidemiological  and / or clinical management purposes  to differentiate between  SARS-CoV-2 and other Sarbecovirus currently known to infect humans.  If clinically indicated additional testing with an alternate test  methodology [phone]) is advised. The SARS-CoV-2 RNA is generally  detectable in upper and lower respiratory sp ecimens during the acute  phase of infection. The expected result is Negative. Fact Sheet for Patients:  StrictlyIdeas.no Fact Sheet for Healthcare Providers: BankingDealers.co.za This test is not yet approved or cleared by the [place] FDA and has been authorized for detection and/or diagnosis of SARS-CoV-2 by FDA under an Emergency Use Authorization (EUA).  This EUA will remain in effect (meaning this test can be used) for the duration of the COVID-19 declaration under Section 564(b)(1) of the Act, 21 U.S.C. section 360bbb-3(b)(1), unless the authorization is terminated or revoked sooner. Performed at [facility], [place] [address] [place], [place] [address]   Urine culture     Status: None   Collection Time: 07/14/19  6:11 PM   Specimen: Urine, Random  Result Value Ref Range Status   Specimen Description   Final    URINE, RANDOM Performed at [place] [address], [place], [place] [address]    Special Requests   Final    NONE Performed at [facility], [place] [address], [name], [place] [address]    Culture   Final    NO GROWTH Performed at [facility], [place] [address] [place], [place] [address]    Report Status 07/15/2019 FINAL  Final  MRSA PCR Screening     Status: None   Collection Time: 07/14/19  9:23 PM   Specimen: Nasal Mucosa; Nasopharyngeal  Result Value Ref Range Status   MRSA by PCR NEGATIVE NEGATIVE Final    Comment:        The GeneXpert MRSA Assay (  FDA approved for NASAL specimens only), is one component of a comprehensive MRSA colonization surveillance program. It is not intended to diagnose MRSA infection nor to guide or monitor treatment for MRSA infections. Performed at [facility], [place] [address], [place], [place] [address]   Culture, blood (routine x 2)     Status: None (Preliminary result)   Collection Time: 07/15/19 12:31 AM   Specimen: BLOOD  Result Value Ref Range Status   Specimen Description   Final    BLOOD RIGHT WRIST Performed at [place] [address] [place], [place] [address]    Special Requests   Final    BOTTLES DRAWN AEROBIC AND ANAEROBIC Blood Culture adequate volume Performed at [place] [address] [place], [place] [address]    Culture  Setup Time   Final    GRAM POSITIVE COCCI IN BOTH AEROBIC AND ANAEROBIC BOTTLES CRITICAL RESULT CALLED TO, READ BACK BY AND VERIFIED WITH: Gustavo Lah [id number] AT 1420 BY CM Performed at [facility], [place] [address] [place], [place] [address]    Culture GRAM POSITIVE COCCI  Final   Report Status PENDING  Incomplete  Blood Culture ID Panel (Reflexed)     Status: Abnormal   Collection Time: 07/15/19 12:31 AM  Result Value Ref Range Status   Enterococcus species NOT DETECTED NOT DETECTED Final   Listeria monocytogenes NOT DETECTED NOT DETECTED Final   Staphylococcus species DETECTED (A) NOT DETECTED Final    Comment: Methicillin (oxacillin) resistant  coagulase negative staphylococcus. Possible blood culture contaminant (unless isolated from more than one blood culture draw or clinical case suggests pathogenicity). No antibiotic treatment is indicated for blood  culture contaminants. CRITICAL RESULT CALLED TO, READ BACK BY AND VERIFIED WITH: PHARMD E JACKSON [id number] AT 1420 BY CM    Staphylococcus aureus (BCID) NOT DETECTED NOT DETECTED Final   Methicillin resistance DETECTED (A) NOT DETECTED Final    Comment: CRITICAL RESULT CALLED TO, READ BACK BY AND VERIFIED WITH: PHARMD E JACKSON [id number] AT 1420 BY CM    Streptococcus species NOT DETECTED NOT DETECTED Final   Streptococcus agalactiae NOT DETECTED NOT DETECTED Final   Streptococcus pneumoniae NOT DETECTED NOT DETECTED Final   Streptococcus pyogenes NOT DETECTED NOT DETECTED Final   Acinetobacter baumannii NOT DETECTED NOT DETECTED Final   Enterobacteriaceae species NOT DETECTED NOT DETECTED Final   Enterobacter cloacae complex NOT DETECTED NOT DETECTED Final   Escherichia coli NOT DETECTED NOT DETECTED Final   Klebsiella oxytoca NOT DETECTED NOT DETECTED Final   Klebsiella pneumoniae NOT DETECTED NOT DETECTED Final   Proteus species NOT DETECTED NOT DETECTED Final   Serratia marcescens NOT DETECTED NOT DETECTED Final   Haemophilus influenzae NOT DETECTED NOT DETECTED Final   Neisseria meningitidis NOT DETECTED NOT DETECTED Final   Pseudomonas aeruginosa NOT DETECTED NOT DETECTED Final   Candida albicans NOT DETECTED NOT DETECTED Final   Candida glabrata NOT DETECTED NOT DETECTED Final   Candida krusei NOT DETECTED NOT DETECTED Final   Candida parapsilosis NOT DETECTED NOT DETECTED Final   Candida tropicalis NOT DETECTED NOT DETECTED Final    Comment: Performed at [facility], [address], [place], [place] [address]  Culture, blood (routine x 2)     Status: None (Preliminary result)   Collection Time: 07/16/19  3:03 PM   Specimen: BLOOD  Result Value Ref Range Status    Specimen Description   Final    BLOOD  RIGHT ANTECUBITAL Performed at [place] [address] [place], [place] [address]    Special Requests   Final    BOTTLES DRAWN AEROBIC ONLY Blood Culture adequate volume Performed at [place] [address], [place], [place] [address]    Culture   Final    NO GROWTH < 12 HOURS Performed at [place] [address] [place], [place] [address]    Report Status PENDING  Incomplete  Culture, blood (routine x 2)     Status: None (Preliminary result)   Collection Time: 07/16/19  3:03 PM   Specimen: BLOOD RIGHT HAND  Result Value Ref Range Status   Specimen Description   Final    BLOOD RIGHT HAND Performed at [place] [address], [place], [place] [address]    Special Requests   Final    BOTTLES DRAWN AEROBIC ONLY Blood Culture adequate volume Performed at [place] [address], [place], [place] [address]    Culture   Final    NO GROWTH < 12 HOURS Performed at [place] [address] [place], [place] [address]    Report Status PENDING  Incomplete         Radiology Studies: No results found.      Scheduled Meds: . diltiazem  180 mg Oral Daily  . enoxaparin (LOVENOX) injection  40 mg Subcutaneous QHS  . hydrocortisone   Rectal BID  . levothyroxine  50 mcg Oral Q0600  . metoCLOPramide  5 mg Oral TID AC & HS  . mirtazapine  7.5 mg Oral QHS  . multivitamin with minerals  1 tablet Oral Daily  . pantoprazole  40 mg Oral BID   Continuous Infusions: . vancomycin Stopped (07/16/19 1900)     LOS: 3 days    Time spent: 48 minutes.     Hosie Poisson, MD [facility] Pager [phone]  If 7PM-7AM, please contact night-coverage [email or web address] Password [email or web address] 07/17/2019, 1:45 PM

## 2019-07-17 NOTE — Progress Notes (Signed)
Per MD Karleen Hampshire and IV Team RN, OK to use pt's port-a-cath access at time time. Will use port for IV ABT now since current PIV is leaking and TPN will not be initiated until tomorrow.

## 2019-07-17 NOTE — Progress Notes (Signed)
Patient needed to sit up straight in bed for port to give blood for labs this evening.

## 2019-07-17 NOTE — Progress Notes (Signed)
MD paged to be made aware of K- 2.8 and WBC- 2.8. Awaiting new orders.

## 2019-07-18 DIAGNOSIS — R7881 Bacteremia: Secondary | ICD-10-CM

## 2019-07-18 LAB — CBC
HCT: 28.9 % — ABNORMAL LOW (ref 36.0–46.0)
Hemoglobin: 9.2 g/dL — ABNORMAL LOW (ref 12.0–15.0)
MCH: 30.8 pg (ref 26.0–34.0)
MCHC: 31.8 g/dL (ref 30.0–36.0)
MCV: 96.7 fL (ref 80.0–100.0)
Platelets: 189 10*3/uL (ref 150–400)
RBC: 2.99 MIL/uL — ABNORMAL LOW (ref 3.87–5.11)
RDW: 14.2 % (ref 11.5–15.5)
WBC: 3.8 10*3/uL — ABNORMAL LOW (ref 4.0–10.5)
nRBC: 0 % (ref 0.0–0.2)

## 2019-07-18 LAB — DIFFERENTIAL
Abs Immature Granulocytes: 0.02 10*3/uL (ref 0.00–0.07)
Basophils Absolute: 0 10*3/uL (ref 0.0–0.1)
Basophils Relative: 1 %
Eosinophils Absolute: 0.1 10*3/uL (ref 0.0–0.5)
Eosinophils Relative: 4 %
Immature Granulocytes: 1 %
Lymphocytes Relative: 16 %
Lymphs Abs: 0.6 10*3/uL — ABNORMAL LOW (ref 0.7–4.0)
Monocytes Absolute: 0.4 10*3/uL (ref 0.1–1.0)
Monocytes Relative: 11 %
Neutro Abs: 2.6 10*3/uL (ref 1.7–7.7)
Neutrophils Relative %: 67 %

## 2019-07-18 LAB — COMPREHENSIVE METABOLIC PANEL
ALT: 26 U/L (ref 0–44)
AST: 27 U/L (ref 15–41)
Albumin: 2.4 g/dL — ABNORMAL LOW (ref 3.5–5.0)
Alkaline Phosphatase: 102 U/L (ref 38–126)
Anion gap: 7 (ref 5–15)
BUN: 8 mg/dL (ref 8–23)
CO2: 24 mmol/L (ref 22–32)
Calcium: 8.8 mg/dL — ABNORMAL LOW (ref 8.9–10.3)
Chloride: 111 mmol/L (ref 98–111)
Creatinine, Ser: 0.83 mg/dL (ref 0.44–1.00)
GFR calc Af Amer: >60 mL/min (ref >60)
GFR calc non Af Amer: >60 mL/min (ref >60)
Glucose, Bld: 131 mg/dL — ABNORMAL HIGH (ref 70–99)
Potassium: 3.7 mmol/L (ref 3.5–5.1)
Sodium: 142 mmol/L (ref 135–145)
Total Bilirubin: 0.4 mg/dL (ref 0.3–1.2)
Total Protein: 5.6 g/dL — ABNORMAL LOW (ref 6.5–8.1)

## 2019-07-18 LAB — MAGNESIUM: Magnesium: 2.1 mg/dL (ref 1.7–2.4)

## 2019-07-18 LAB — TRIGLYCERIDES: Triglycerides: 120 mg/dL (ref <150)

## 2019-07-18 LAB — PREALBUMIN: Prealbumin: 10.2 mg/dL — ABNORMAL LOW (ref 18–38)

## 2019-07-18 LAB — PHOSPHORUS: Phosphorus: 4.4 mg/dL (ref 2.5–4.6)

## 2019-07-18 MED ORDER — BOOST / RESOURCE BREEZE PO LIQD CUSTOM: 1.0000 | Freq: Two times a day (BID) | ORAL | Status: DC

## 2019-07-18 MED ORDER — ENSURE ENLIVE PO LIQD: 237.0000 mL | ORAL | Status: DC

## 2019-07-18 MED ORDER — ENSURE ENLIVE PO LIQD
237.0000 mL | Freq: Two times a day (BID) | ORAL | Status: DC
  Administered 2019-07-18 – 2019-07-21 (×4): 237 mL via ORAL

## 2019-07-18 NOTE — Progress Notes (Signed)
SlP order received.  Apologize for delay in service and will plan to see pt next date am.  Of note, pt was seen by SLP during July admit and no oropharyngeal dysphagia was noted at that time.  Thanks.   Luanna Salk, [facility] SLP Acute Rehab Services Pager [phone] Office [phone]

## 2019-07-18 NOTE — Progress Notes (Addendum)
Initial Nutrition Assessment  DOCUMENTATION CODES:   Non-severe (moderate) malnutrition in context of chronic illness  INTERVENTION:  - will order Ensure Enlive BID, each supplement provides 350 kcal and 20 grams protein.  - will order Magic Cup with dinner meals, each supplement provides 290 kcal and 9 grams of protein. - TPN initiation and management per Pharmacy.    NUTRITION DIAGNOSIS:   Moderate Malnutrition related to chronic illness, catabolic illness, cancer and cancer related treatments as evidenced by mild fat depletion, mild muscle depletion.  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 She presented to the ED on 8/20 d/t fever and malfunctioning line. She was found to have RLL PNA and started on IV rocephin and azithromycin and then was changed to IV vancomycin and IV zosyn.  She was started on CLD on admission (8/20) and advanced to FLD on 8/22 at 1209. Per flow sheet, she consumed 50% of breakfast on 8/21 (145 kcal, 1 gram protein); 50% of lunch on 8/22 (132 kcal, 0.5 grams protein), and 50% of lunch on 8/23 (244 kcal, 6 grams protein). Will order ONS as outlined above.  Patient is known to this RD from hospitalization last month. Patient is able to consume FLD at home without issue but is unable to meet nutrition needs with PO intakes alone. She was on TPN during previous hospitalization and has been on home TPN since that time.   Patient was assessed by [facility] RD via phone on 07/13/19. Patient discharged from rehab facility to home on 8/17. She has tried [employer] and Ensure supplements at home.   Per chart review,  current weight is 126 lb and weight is  The highest it has been in the past 2 months. Per Pharmacy note this AM, patient is receiving IV abx via port as IV team has been unsuccessful at obtaining PIV access so she needs a line for TPN. Continuing to hold TPN d/t no access.   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 Medications reviewed; 50 mcg oral synthroid/day, daily multivitamin with minerals, 40 mg oral protonix BID, 10 mEq IV KCl x2 runs 8/23, 40 mEq K-Dur x1 dose 8/23.     NUTRITION - FOCUSED PHYSICAL EXAM:    Most Recent Value  Orbital Region  Mild depletion  Upper Arm Region  Mild depletion  Thoracic and Lumbar Region  Unable to assess  Buccal Region  Mild depletion  Clavicle Bone Region  Mild depletion  Clavicle and Acromion Bone Region  Mild depletion  Scapular Bone Region  Unable to assess  Dorsal Hand  No depletion  Patellar Region  Mild depletion  Anterior Thigh Region  No depletion  Posterior Calf Region  Mild depletion  Edema (RD Assessment)  None  Hair  Reviewed  Eyes  Reviewed  Mouth  Reviewed  Skin  Reviewed  Nails  Reviewed       Diet Order:   Diet Order            Diet full liquid Room service appropriate? Yes; Fluid consistency: Thin  Diet effective now              EDUCATION NEEDS:   No education needs have been identified at this time  Skin:  Skin Assessment: Reviewed RN Assessment  Last BM:  8/23  Height:   Ht Readings from Last 1 Encounters:  07/14/19 5\' 4"  (1.626 m)    Weight:   Wt Readings from Last 1 Encounters:  07/14/19 57.3 kg    Ideal Body Weight:  54.5 kg  BMI:  Body mass index is 21.68 kg/m.  Estimated Nutritional  Needs:   Kcal:  [id number] kcal  Protein:  100-115 grams  Fluid:  >/= 2 L/day     Jarome Matin, MS, RD, LDN, [facility] Inpatient Clinical Dietitian Pager # [phone] After hours/weekend pager # [phone]

## 2019-07-18 NOTE — Evaluation (Signed)
Physical Therapy One Time Evaluation Patient Details Name: Julie Rollins MRN: [id number] DOB: December 12, 1945 Today's Date: 07/18/2019   History of Present Illness  73 y.o. 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 All education has been completed and the patient has no further questions.  Pt ambulated in hallway and presents modified independent.  Pt agreeable to ambulate in hallways during acute stay.  Pt also reports she has been up in her room independently.  See below for any follow-up Physical Therapy or equipment needs. PT is signing off. Thank you for this referral.     Follow Up Recommendations No PT follow up    Equipment Recommendations  None recommended by PT    Recommendations for Other Services       Precautions / Restrictions Precautions Precautions: None      Mobility  Bed Mobility Overal bed mobility: Modified Independent                Transfers Overall transfer level: Modified independent                  Ambulation/Gait Ambulation/Gait assistance: Modified independent (Device/Increase time) Gait Distance (Feet): 360 Feet Assistive device: IV Pole Gait Pattern/deviations: WFL(Within Functional Limits)     General Gait Details: pt able to ambulate with IV pole and no assistance  Stairs            Wheelchair Mobility    Modified Rankin (Stroke Patients Only)       Balance                                             Pertinent Vitals/Pain Pain Assessment: No/denies pain Pain  Intervention(s): Monitored during session;Repositioned    Home Living Family/patient expects to be discharged to:: Private residence Living Arrangements: Alone Available Help at Discharge: Friend(s);Available PRN/intermittently Type of Home: Apartment Home Access: Level entry     Home Layout: One level Home Equipment: Grab bars - tub/shower;Walker - 4 wheels(rollator)      Prior Function Level of Independence: Independent         Comments: Pt lives alone and is independent at baseline. She is currently limited by fatigue with mobilty and ADL's. Pt is independent with gait and transfers wtih no device and uses rollator if fatigued, she denies falls in last 6 months     Hand Dominance        Extremity/Trunk Assessment        Lower Extremity Assessment Lower Extremity Assessment: Overall WFL for tasks assessed    Cervical / Trunk Assessment Cervical / Trunk Assessment: Normal  Communication   Communication: No difficulties  Cognition Arousal/Alertness: Awake/alert Behavior During Therapy: WFL for tasks assessed/performed Overall Cognitive Status: Within Functional Limits for tasks assessed                                        General Comments  Exercises     Assessment/Plan    PT Assessment Patent does not need any further PT services  PT Problem List         PT Treatment Interventions      PT Goals (Current goals can be found in the Care Plan section)  Acute Rehab PT Goals PT Goal Formulation: All assessment and education complete, DC therapy    Frequency     Barriers to discharge        Co-evaluation               AM-PAC PT "6 Clicks" Mobility  Outcome Measure Help needed turning from your back to your side while in a flat bed without using bedrails?: None Help needed moving from lying on your back to sitting on the side of a flat bed without using bedrails?: None Help needed moving to and from a bed to a chair  (including a wheelchair)?: None Help needed standing up from a chair using your arms (e.g., wheelchair or bedside chair)?: None Help needed to walk in hospital room?: None Help needed climbing 3-5 steps with a railing? : A Little 6 Click Score: 23    End of Session   Activity Tolerance: Patient tolerated treatment well Patient left: in chair;with call bell/phone within reach Nurse Communication: Mobility status PT Visit Diagnosis: Difficulty in walking, not elsewhere classified (R26.2)    Time: [id number] PT Time Calculation (min) (ACUTE ONLY): 11 min   Charges:   PT Evaluation $PT Eval Low Complexity: [place], PT, DPT Acute Rehabilitation Services Office: [phone] Pager: [phone]  Trena Platt 07/18/2019, 11:45 AM

## 2019-07-18 NOTE — Progress Notes (Signed)
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  On arrival to ED she was found to have right lower lobe pneumonia, she was started on IV Rocephin and azithromycin. Her blood cultures grew coag neg staph in one set of blood cultures and antibiotics changed to IV vancomycin and IV zosyn.    Assessment & Plan:   Active Problems:   Carcinoma of head of pancreas ([place])   Sepsis due to undetermined organism ([place])   Sepsis secondary to community acquired pneumonia vs aspiration pneumonia.  Appears to be continuing to improve with IV antibiotics.  Pt received IV Rocephin and IV Zithromax on 8/20 to 8/21, antibiotics were changed to IV vancomycin ( blood cultures were positive for coag neg staph) on 07/16/2019 and IV zosyn added on 07/17/2019 for gram neg coverage and anaerobes. SLP Evaluation ordered to check for aspiration/ dysphagia.  Repeat CXR ordered to evaluate the aspiration pneumonia.  Pt is on RA with good sats. She has been afebrile so far. Recheck labs today.  Pro calcitonin is 0.32. UA IS NEGATIVE.  Repeat blood cultures ordered and are negative so far.   Coag negative staph bacteremia, positive in one set of blood cultures , in both aerobic and anaerobic bottles drawn on 8/21 at 12:31 am.  In view of her immunocompromised state, with IV port on TPN, we will go ahead and treat it as true infection. Repeated blood cultures.  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   Hyponatremia:  Suspect from dehydration.  Pt received IV hydration and repeat labs today.    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  - s/p neo adjuvant chemo for 2 cycles, later on stopped as she is not able to tolerate.  - she completed SBRT therapy from 8/4 till 8/14.  - further management as per oncology outpatient   Hypertension:  Well controlled   Hypothyroidism:  Continue with synthroid.   Anemia of chronic disease and malignancy.  Stable around 9. Please transfuse to keep it greater than 7.   DVT prophylaxis: Lovenox SQ  Code Status: full    Code Status Orders  (From admission, onward)         Start     Ordered   07/14/19 2123  Full code  Continuous     07/14/19 2123        Code Status History    Date Active Date Inactive Code Status Order ID Comments User Context   06/13/2019 1739 06/23/2019 1753 Full Code [id number]  Kerney Elbe, DO Inpatient   03/28/2019 1726 03/30/2019 1737 Full Code [id number]  Jonnie Finner, DO ED   07/31/2018 2047 08/06/2018 0006 Full Code [id number]  Clovis Riley, MD ED   Advance Care Planning Activity    Advance Directive Documentation     Most Recent Value  Type of Advance Directive  Living will  Pre-existing out of facility DNR order (yellow form or pink MOST form)  -  "MOST" Form in Place?  -     Family Communication: None today Disposition Plan:   Patient remained inpatient to continue treat with IV antibiotics.  Without this treatment patient risk of life-threatening clinical deterioration Consults called: None Admission status: Inpatient   Consultants:   None  Procedures:  Dg Chest 2 View  Result Date: 07/17/2019 CLINICAL DATA:  Weakness, shortness of breath EXAM: CHEST - 2 VIEW COMPARISON:  07/14/2019 FINDINGS: Left Port-A-Cath remains in place, unchanged.  Heart and mediastinal contours are within normal limits. No focal opacities or effusions. No acute bony abnormality. Old mild compression fracture in the lower thoracic spine. IMPRESSION: He Electronically Signed   By: Rolm Baptise M.D.   On: 07/17/2019 16:34   Dg Chest Portable 1 View  Result Date: 07/14/2019 CLINICAL DATA:  Fever EXAM: PORTABLE CHEST 1 VIEW COMPARISON:  04/12/2019 FINDINGS: Left-sided central venous port tip over the SVC. No pleural effusion. Stable cardiomediastinal silhouette. Small foci of airspace disease in the right base. No pneumothorax IMPRESSION: 1. Small patchy foci of airspace disease at the right base are questionable for foci of infection or inflammation 2. The lung fields are otherwise clear Electronically Signed   By: Donavan Foil M.D.   On: 07/14/2019 16:09      Antimicrobials: IV VANCOMYCIN FROM 07/16/2019  Iv rocephin and IV zithromax from 8/21 till 8/22    Subjective: Patient doing well no chest pain, respiratory status stable No nausea or vomiting or abdominal pain this morning  Objective: Vitals:   07/17/19 1150 07/17/19 1255 07/18/19 0624 07/18/19 0925  BP:  139/64 (!) 110/59 115/62  Pulse:  89 80   Resp:  18 18   Temp:  97.9 F (36.6 C) 98 F (36.7 C)   TempSrc:  Oral    SpO2: 97% 99% 96%   Weight:      Height:       No intake or output data in the 24 hours ending 07/18/19 1404 Filed Weights   07/14/19 1412 07/14/19 2327  Weight: 54.4 kg 57.3 kg    Examination:  General exam: Appears calm and comfortable although mildly anxious Respiratory system: Clear to auscultation. Respiratory effort normal. Cardiovascular system: S1 & S2 heard, RRR. No JVD, murmurs, rubs, gallops or clicks. No pedal edema. Gastrointestinal system: Abdomen is nondistended, soft and nontender.  Central nervous system: Alert and oriented. No focal neurological deficits. Extremities: Warm well perfused, no edema Skin: No rashes, lesions or ulcers Psychiatry:  Judgement and insight appear normal. Mood & affect mildly anxious    Data Reviewed: I have personally reviewed following labs and imaging studies  CBC: Recent Labs  Lab 07/14/19 1506 07/15/19 0031 07/17/19 1640 07/18/19 0406  WBC 5.6 7.5 2.8* 3.8*  NEUTROABS 4.7  --  1.5* 2.6  HGB 10.9* 9.7* 9.1* 9.2*  HCT 33.2* 30.4* 28.7* 28.9*  MCV 94.6 97.7 95.3 96.7  PLT 343 201 215 [id number]   Basic Metabolic Panel: Recent Labs  Lab 07/14/19 1544 07/17/19 1640 07/18/19 0406  NA 133* 138 142  K 4.7 2.8* 3.7  CL 103 108 111  CO2 22 24 24   GLUCOSE 151* 211* 131*  BUN 16 7* 8  CREATININE 0.52 0.45 0.83  CALCIUM 7.7* 8.3* 8.8*  MG  --   --  2.1  PHOS  --   --  4.4   GFR: Estimated Creatinine Clearance: 50.6 mL/min (  by C-G formula based on SCr of 0.83 mg/dL). Liver Function Tests: Recent Labs  Lab 07/14/19 1544 07/17/19 1640 07/18/19 0406  AST 45* 26 27  ALT 20 26 26   ALKPHOS 102 102 102  BILITOT 1.5* 0.4 0.4  PROT 5.4* 5.5* 5.6*  ALBUMIN 2.2* 2.3* 2.4*   Recent Labs  Lab 07/14/19 1544  LIPASE 45   No results for input(s): AMMONIA in the last 168 hours. Coagulation Profile: No results for input(s): INR, PROTIME in the last 168 hours. Cardiac Enzymes: No results for input(s): CKTOTAL, CKMB, CKMBINDEX, TROPONINI in the last 168 hours. BNP (last 3 results) No results for input(s): PROBNP in the last 8760 hours. HbA1C: No results for input(s): HGBA1C in the last 72 hours. CBG: No results for input(s): GLUCAP in the last 168 hours. Lipid Profile: Recent Labs    07/18/19 0406  TRIG 120   Thyroid Function Tests: No results for input(s): TSH, T4TOTAL, FREET4, T3FREE, THYROIDAB in the last 72 hours. Anemia Panel: No results for input(s): VITAMINB12, FOLATE, FERRITIN, TIBC, IRON, RETICCTPCT in the last 72 hours. Sepsis Labs: Recent Labs  Lab 07/14/19 1509 07/14/19 1659 07/15/19 0031 07/16/19 0521  PROCALCITON  --   --  0.43 0.32  LATICACIDVEN 2.3* 1.5  --   --      Recent Results (from the past 240 hour(s))  Culture, blood (routine x 2)     Status: None (Preliminary result)   Collection Time: 07/14/19  2:47 PM   Specimen: BLOOD  Result Value Ref Range Status   Specimen Description   Final    BLOOD RIGHT HAND Performed at [facility], [place] [address] [place], [place] [address]    Special Requests   Final    BOTTLES DRAWN AEROBIC AND ANAEROBIC Blood Culture adequate volume Performed at [facility] [address], [place], [place] [address]    Culture   Final    NO GROWTH 4 DAYS Performed at [facility], [place] [address] [place], [place] [address]    Report Status PENDING  Incomplete  SARS Coronavirus 2 [facility] order, Performed in [facility] hospital lab) Nasopharyngeal Nasopharyngeal Swab     Status: None   Collection Time: 07/14/19  3:23 PM   Specimen: Nasopharyngeal Swab  Result Value Ref Range Status   SARS Coronavirus 2 NEGATIVE NEGATIVE Final    Comment: (NOTE) If result is NEGATIVE SARS-CoV-2 target nucleic acids are NOT DETECTED. The SARS-CoV-2 RNA is generally detectable in upper and lower  respiratory specimens during the acute phase of infection. The lowest  concentration of SARS-CoV-2 viral copies this assay can detect is 250  copies / mL. A negative result does not preclude SARS-CoV-2 infection  and should not be used as the sole basis for treatment or other  patient management decisions.  A negative result may occur with  improper specimen collection / handling, submission of specimen other  than nasopharyngeal swab, presence of viral mutation(s) within the  areas targeted by this assay, and inadequate number of viral copies  (<250 copies / mL). A negative result must be combined with clinical  observations, patient history, and epidemiological information. If result is POSITIVE SARS-CoV-2 target nucleic acids are DETECTED. The SARS-CoV-2 RNA is generally detectable in upper  and lower  respiratory specimens dur ing the acute phase of infection.  Positive  results are indicative of active infection with SARS-CoV-2.  Clinical  correlation with patient history and other diagnostic information is  necessary  to determine patient infection status.  Positive results do  not rule out bacterial infection or co-infection with other viruses. If result is PRESUMPTIVE POSTIVE SARS-CoV-2 nucleic acids MAY BE PRESENT.   A presumptive positive result was obtained on the submitted specimen  and confirmed on repeat testing.  While 2019 novel coronavirus  (SARS-CoV-2) nucleic acids may be present in the submitted sample  additional confirmatory testing may be necessary for epidemiological  and / or clinical management purposes  to differentiate between  SARS-CoV-2 and other Sarbecovirus currently known to infect humans.  If clinically indicated additional testing with an alternate test  methodology [phone]) is advised. The SARS-CoV-2 RNA is generally  detectable in upper and lower respiratory sp ecimens during the acute  phase of infection. The expected result is Negative. Fact Sheet for Patients:  StrictlyIdeas.no Fact Sheet for Healthcare Providers: BankingDealers.co.za This test is not yet approved or cleared by the [place] FDA and has been authorized for detection and/or diagnosis of SARS-CoV-2 by FDA under an Emergency Use Authorization (EUA).  This EUA will remain in effect (meaning this test can be used) for the duration of the COVID-19 declaration under Section 564(b)(1) of the Act, 21 U.S.C. section 360bbb-3(b)(1), unless the authorization is terminated or revoked sooner. Performed at [facility], [place] [address] [place], [place] [address]   Urine culture     Status: None   Collection Time: 07/14/19  6:11 PM   Specimen: Urine, Random  Result Value Ref Range Status   Specimen Description    Final    URINE, RANDOM Performed at [facility] [address], [place], [place] [address]    Special Requests   Final    NONE Performed at [facility], [place] [address], [place], [place] [address]    Culture   Final    NO GROWTH Performed at [facility], [place] [address], [place], [place] [address]    Report Status 07/15/2019 FINAL  Final  MRSA PCR Screening     Status: None   Collection Time: 07/14/19  9:23 PM   Specimen: Nasal Mucosa; Nasopharyngeal  Result Value Ref Range Status   MRSA by PCR NEGATIVE NEGATIVE Final    Comment:        The GeneXpert MRSA Assay (FDA approved for NASAL specimens only), is one component of a comprehensive MRSA colonization surveillance program. It is not intended to diagnose MRSA infection nor to guide or monitor treatment for MRSA infections. Performed at [facility], [place] [address] [place], [place] [address]   Culture, blood (routine x 2)     Status: Abnormal (Preliminary result)   Collection Time: 07/15/19 12:31 AM   Specimen: BLOOD  Result Value Ref Range Status   Specimen Description   Final    BLOOD RIGHT WRIST Performed at [facility] [address], [place], [place] [address]    Special Requests   Final    BOTTLES DRAWN AEROBIC AND ANAEROBIC Blood Culture adequate volume Performed at [facility] [address] [place], [place] [address]    Culture  Setup Time   Final    GRAM POSITIVE COCCI IN BOTH AEROBIC AND ANAEROBIC BOTTLES CRITICAL RESULT CALLED TO, READ BACK BY AND VERIFIED WITH: PHARMD E JACKSON [id number] AT 1420 BY CM    Culture (A)  Final    STAPHYLOCOCCUS SPECIES (COAGULASE NEGATIVE) SUSCEPTIBILITIES TO FOLLOW Performed at [facility], [place]  [address], [place], [place] [address]    Report Status PENDING  Incomplete  Blood Culture ID Panel (Reflexed)     Status: Abnormal   Collection Time: 07/15/19 12:31  AM  Result Value Ref Range Status   Enterococcus species NOT DETECTED NOT DETECTED Final   Listeria monocytogenes NOT DETECTED NOT DETECTED Final   Staphylococcus species DETECTED (A) NOT DETECTED Final    Comment: Methicillin (oxacillin) resistant coagulase negative staphylococcus. Possible blood culture contaminant (unless isolated from more than one blood culture draw or clinical case suggests pathogenicity). No antibiotic treatment is indicated for blood  culture contaminants. CRITICAL RESULT CALLED TO, READ BACK BY AND VERIFIED WITH: PHARMD E JACKSON [id number] AT 1420 BY CM    Staphylococcus aureus (BCID) NOT DETECTED NOT DETECTED Final   Methicillin resistance DETECTED (A) NOT DETECTED Final    Comment: CRITICAL RESULT CALLED TO, READ BACK BY AND VERIFIED WITH: PHARMD E JACKSON [id number] AT 1420 BY CM    Streptococcus species NOT DETECTED NOT DETECTED Final   Streptococcus agalactiae NOT DETECTED NOT DETECTED Final   Streptococcus pneumoniae NOT DETECTED NOT DETECTED Final   Streptococcus pyogenes NOT DETECTED NOT DETECTED Final   Acinetobacter baumannii NOT DETECTED NOT DETECTED Final   Enterobacteriaceae species NOT DETECTED NOT DETECTED Final   Enterobacter cloacae complex NOT DETECTED NOT DETECTED Final   Escherichia coli NOT DETECTED NOT DETECTED Final   Klebsiella oxytoca NOT DETECTED NOT DETECTED Final   Klebsiella pneumoniae NOT DETECTED NOT DETECTED Final   Proteus species NOT DETECTED NOT DETECTED Final   Serratia marcescens NOT DETECTED NOT DETECTED Final   Haemophilus influenzae NOT DETECTED NOT DETECTED Final   Neisseria meningitidis NOT DETECTED NOT DETECTED Final   Pseudomonas aeruginosa NOT DETECTED NOT DETECTED Final   Candida albicans NOT DETECTED NOT DETECTED Final   Candida glabrata NOT DETECTED NOT DETECTED Final   Candida krusei NOT DETECTED NOT DETECTED Final   Candida parapsilosis NOT DETECTED NOT DETECTED Final   Candida tropicalis NOT DETECTED NOT DETECTED  Final    Comment: Performed at [facility], [address], [place], [place] [address]  Culture, blood (routine x 2)     Status: None (Preliminary result)   Collection Time: 07/16/19  3:03 PM   Specimen: BLOOD  Result Value Ref Range Status   Specimen Description   Final    BLOOD RIGHT ANTECUBITAL Performed at [facility] [address] [place], [place] [address]    Special Requests   Final    BOTTLES DRAWN AEROBIC ONLY Blood Culture adequate volume Performed at [facility] [address], [place], [place] [address]    Culture   Final    NO GROWTH 2 DAYS Performed at [facility] [address], [place], [place] [address]    Report Status PENDING  Incomplete  Culture, blood (routine x 2)     Status: None (Preliminary result)   Collection Time: 07/16/19  3:03 PM   Specimen: BLOOD RIGHT HAND  Result Value Ref Range Status   Specimen Description   Final    BLOOD RIGHT HAND Performed at [facility] [address] [place], [place] [address]    Special Requests   Final    BOTTLES DRAWN AEROBIC ONLY Blood Culture adequate volume Performed at [facility] [address] [place], [place] [address]    Culture   Final    NO GROWTH 2 DAYS Performed at [facility],  [address], [place], [place] [address]    Report Status PENDING  Incomplete         Radiology Studies: Dg Chest 2 View  Result Date: 07/17/2019 CLINICAL DATA:  Weakness, shortness of breath EXAM: CHEST - 2 VIEW COMPARISON:  07/14/2019 FINDINGS: Left Port-A-Cath remains in place, unchanged. Heart and mediastinal contours are within normal limits. No focal opacities or effusions. No acute bony abnormality. Old mild compression fracture in the lower thoracic spine. IMPRESSION: He Electronically Signed   By: Rolm Baptise M.D.   On: 07/17/2019 16:34        Scheduled Meds: . diltiazem  180 mg Oral Daily  .  enoxaparin (LOVENOX) injection  40 mg Subcutaneous QHS  . feeding supplement (ENSURE ENLIVE)  237 mL Oral BID BM  . hydrocortisone   Rectal BID  . levothyroxine  50 mcg Oral Q0600  . metoCLOPramide  5 mg Oral TID AC & HS  . mirtazapine  7.5 mg Oral QHS  . multivitamin with minerals  1 tablet Oral Daily  . pantoprazole  40 mg Oral BID   Continuous Infusions: . piperacillin-tazobactam (ZOSYN)  IV 3.375 g (07/18/19 0753)  . vancomycin Stopped (07/17/19 1900)     LOS: 4 days    Time spent: 35 min    Nicolette Bang, MD [facility]  If 7PM-7AM, please contact night-coverage  07/18/2019, 2:04 PM

## 2019-07-18 NOTE — Care Management Important Message (Signed)
Important Message  Patient Details  Name: Julie Rollins MRN: [id number] Date of Birth: [birth year]   Medicare Important Message Given:  Yes. CMA printed out IM for Case Management Nurse or CSW to give to patient.      Ashwath Lasch 07/18/2019, 8:40 AM

## 2019-07-18 NOTE — Progress Notes (Signed)
PHARMACY - ADULT TOTAL PARENTERAL NUTRITION CONSULT NOTE   Pharmacy Consult for TPN Indication: hx partial GOO on TPN at home  Patient Measurements: Height: 5\' 4"  (162.6 cm) Weight: 126 lb 5.2 oz (57.3 kg) IBW/kg (Calculated) : 54.7 TPN AdjBW (KG): 57.3 Body mass index is 21.68 kg/m.  Insulin Requirements: none ordered  Current Nutrition: full liquid diet - eating ~50%  IVF: none  Central access: implanted port 04/12/19 TPN start date: on PTA  ASSESSMENT                                                                                                          HPI: 73 yo F with PMH significant for pancreatic CA s/p partial GOO on home TPN.  She is admitted with PNA and known to pharmacy from antibiotic dosing.   Significant events:   Today:   Glucose - no hx DM & not on insulin with TPN at home.  Glucose this morning 131 (goal <150).  Electrolytes - WNL, K+ improved after total 90mEq Kcl on 8/23  Renal - WNL, est CrCl ~88ml/min  LFTs - WNL  TGs - WNL- 120 (8/24)  Prealbumin - Below goal at 10.2 (8/24)  NUTRITIONAL GOALS                                                                                             RD recs: pending RPh estimates: Kcal 1400-1900 Kcal/day, 68-85g protein/day  PLAN                                                                                                                          IV Team has been unsuccessful at obtaining PIV site so patient continues to get IV antibiotics through port.  Spoke with Dr Wyonia Hough- will continue to hold TPN due to access issues.     Biagio Borg 75/24/2020,10:19 AM

## 2019-07-19 LAB — CREATININE, SERUM
Creatinine, Ser: 0.99 mg/dL (ref 0.44–1.00)
GFR calc Af Amer: >60 mL/min (ref >60)
GFR calc non Af Amer: 56 mL/min — ABNORMAL LOW (ref >60)

## 2019-07-19 LAB — CULTURE, BLOOD (ROUTINE X 2)
Culture: NO GROWTH
Special Requests: ADEQUATE

## 2019-07-19 NOTE — Progress Notes (Signed)
PROGRESS NOTE    Julie Rollins  [id number] DOB: 1946-10-28 DOA: 07/14/2019 PCP: Nolene Ebbs, MD   Brief Narrative:  73 year old female with prior history of pancreatic cancer , s/p partialgastric outlet obstruction on TPN at home, hypertension, hypothyroidism, anemia of chronic disease to ED for fever and left-sided not working. On arrival to ED she was found to have right lower lobe pneumonia,she was started on IV Rocephin and azithromycin.Her blood cultures grew coag neg staph in one set of blood cultures and antibiotics changed to IV vancomycin and IV zosyn.  Because of presence of port as well as chemo and radiation with immunosuppression weekend covering team requested sensitivities, unfortunately there was a technical malfunction with the microbiology machine and they are still pending with anticipated repair of machine today August 25   Assessment & Plan:   Active Problems:   Carcinoma of head of pancreas ([place])   Sepsis due to undetermined organism ([place])   Sepsis secondary to community acquired pneumonia vs aspiration pneumonia.  Appears to be continuing to improve with IV antibiotics.  Pt received IV Rocephin and IV Zithromax on 8/20 to 8/21, antibiotics were changed to IV vancomycin ( blood cultures were positive for coag neg staph) on 07/16/2019 and IV zosyn added on 07/17/2019 for gram neg coverage and anaerobes. SLP Evaluation ordered to check for aspiration/ dysphagia.  Repeat CXR ordered to evaluate the aspiration pneumonia.  Pt is on RA with good sats. She has been afebrile so far. Recheck labs today.  Pro calcitonin is 0.32. UA IS NEGATIVE.  Repeat blood cultures ordered and are negative so far.   Coag negative staph bacteremia, positive in one set of blood cultures , in both aerobic and anaerobic bottles drawn on 8/21 at 12:31 am.  In view of her immunocompromised state, with IV port on TPN, we are treating it as true infection. Repeated blood cultures.   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   Hyponatremia:  From dehydration.  Pt received IV hydration with resolution.    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  - s/p neo adjuvant chemo for 2 cycles, later on stopped as she is not able to tolerate.  - she completed SBRT therapy from 8/4 till 8/14.  - further management as per oncology outpatient   Hypertension:  Well controlled   Hypothyroidism:  Continue with synthroid.   Anemia of chronic disease and malignancy.  Stable around 9. Please transfuse to keep it greater than 7. Most recent 9.2  DVT prophylaxis: Lovenox SQ  Code Status: Full    Code Status Orders  (From admission, onward)         Start     Ordered   07/14/19 2123  Full code  Continuous     07/14/19 2123        Code Status History    Date Active Date Inactive Code Status Order ID Comments User Context   06/13/2019 1739 06/23/2019 1753 Full Code [id number]  Kerney Elbe, [place]   03/28/2019 1726 03/30/2019 1737 Full Code [id number]  Jonnie Finner, DO ED   07/31/2018 2047 08/06/2018 [id number]  Full Code [id number]  Clovis Riley, MD ED   Advance Care Planning Activity    Advance Directive Documentation     Most Recent Value  Type of Advance Directive  Living will  Pre-existing out of facility DNR order (yellow form or pink MOST form)  -  "MOST" Form in Place?  -     Family Communication: none today Disposition Plan:   Patient remained inpatient to continue treat with IV antibiotics.  Without this treatment patient risk of life-threatening clinical  deterioration Consults called: None Admission status: Inpatient   Consultants:   None  Procedures:  Dg Chest 2 View  Result Date: 07/17/2019 CLINICAL DATA:  Weakness, shortness of breath EXAM: CHEST - 2 VIEW COMPARISON:  07/14/2019 FINDINGS: Left Port-A-Cath remains in place, unchanged. Heart and mediastinal contours are within normal limits. No focal opacities or effusions. No acute bony abnormality. Old mild compression fracture in the lower thoracic spine. IMPRESSION: He Electronically Signed   By: Rolm Baptise M.D.   On: 07/17/2019 16:34   Dg Chest Portable 1 View  Result Date: 07/14/2019 CLINICAL DATA:  Fever EXAM: PORTABLE CHEST 1 VIEW COMPARISON:  04/12/2019 FINDINGS: Left-sided central venous port tip over the SVC. No pleural effusion. Stable cardiomediastinal silhouette. Small foci of airspace disease in the right base. No pneumothorax IMPRESSION: 1. Small patchy foci of airspace disease at the right base are questionable for foci of infection or inflammation 2. The lung fields are otherwise clear Electronically Signed   By: Donavan Foil M.D.   On: 07/14/2019 16:09     Antimicrobials:  IV VANCOMYCIN FROM 07/16/2019 Iv rocephin and IV zithromax from 8/21 till 8/22     Subjective: No acute events overnight resting comfortably in bedside chair Went over in detail with patient complications with microbiology machine malfunction  Objective: Vitals:   07/18/19 1411 07/18/19 2048 07/19/19 0426 07/19/19 0856  BP: 129/67 126/60 119/63 128/74  Pulse: 85 86 78   Resp: 18 18 16    Temp: 97.7 F (36.5 C) 98.1 F (36.7 C) 97.7 F (36.5 C)   TempSrc: Oral Oral Oral   SpO2: 97% 94% 96%   Weight:      Height:        Intake/Output Summary (Last 24 hours) at 07/19/2019 1241 Last data filed at 07/19/2019 1000 Gross per 24 hour  Intake 1170.06 ml  Output -  Net 1170.06 ml   Filed Weights   07/14/19 1412 07/14/19 2327  Weight: 54.4 kg 57.3 kg    Examination:  General exam:  Appears calm and comfortable, mildly anxious Respiratory system: Clear to auscultation. Respiratory effort normal. Cardiovascular system: S1 & S2 heard, RRR. No JVD, murmurs, rubs, gallops or clicks. No pedal edema. Gastrointestinal system: Abdomen is nondistended, soft and nontender. . Central nervous system: Alert and oriented. No focal neurological deficits. Extremities: Warm well perfused no edema. Skin: No rashes, lesions or ulcers Psychiatry: Judgement and insight appear normal. Mood & affect appropriate.     Data Reviewed: I have personally reviewed following labs and imaging studies  CBC: Recent Labs  Lab 07/14/19 1506 07/15/19 0031 07/17/19 1640 07/18/19 0406  WBC 5.6 7.5 2.8* 3.8*  NEUTROABS 4.7  --  1.5* 2.6  HGB 10.9* 9.7* 9.1* 9.2*  HCT 33.2* 30.4* 28.7* 28.9*  MCV 94.6 97.7 95.3 96.7  PLT 343 201 215 [id number]   Basic Metabolic Panel: Recent Labs  Lab 07/14/19 1544 07/17/19 1640 07/18/19 0406 07/19/19 0438  NA 133* 138 142  --  K 4.7 2.8* 3.7  --   CL 103 108 111  --   CO2 22 24 24   --   GLUCOSE 151* 211* 131*  --   BUN 16 7* 8  --   CREATININE 0.52 0.45 0.83 0.99  CALCIUM 7.7* 8.3* 8.8*  --   MG  --   --  2.1  --   PHOS  --   --  4.4  --    GFR: Estimated Creatinine Clearance: 42.4 mL/min (by C-G formula based on SCr of 0.99 mg/dL). Liver Function Tests: Recent Labs  Lab 07/14/19 1544 07/17/19 1640 07/18/19 0406  AST 45* 26 27  ALT 20 26 26   ALKPHOS 102 102 102  BILITOT 1.5* 0.4 0.4  PROT 5.4* 5.5* 5.6*  ALBUMIN 2.2* 2.3* 2.4*   Recent Labs  Lab 07/14/19 1544  LIPASE 45   No results for input(s): AMMONIA in the last 168 hours. Coagulation Profile: No results for input(s): INR, PROTIME in the last 168 hours. Cardiac Enzymes: No results for input(s): CKTOTAL, CKMB, CKMBINDEX, TROPONINI in the last 168 hours. BNP (last 3 results) No results for input(s): PROBNP in the last 8760 hours. HbA1C: No results for input(s): HGBA1C in the last 72  hours. CBG: No results for input(s): GLUCAP in the last 168 hours. Lipid Profile: Recent Labs    07/18/19 0406  TRIG 120   Thyroid Function Tests: No results for input(s): TSH, T4TOTAL, FREET4, T3FREE, THYROIDAB in the last 72 hours. Anemia Panel: No results for input(s): VITAMINB12, FOLATE, FERRITIN, TIBC, IRON, RETICCTPCT in the last 72 hours. Sepsis Labs: Recent Labs  Lab 07/14/19 1509 07/14/19 1659 07/15/19 0031 07/16/19 0521  PROCALCITON  --   --  0.43 0.32  LATICACIDVEN 2.3* 1.5  --   --     Recent Results (from the past 240 hour(s))  Culture, blood (routine x 2)     Status: None   Collection Time: 07/14/19  2:47 PM   Specimen: BLOOD  Result Value Ref Range Status   Specimen Description   Final    BLOOD RIGHT HAND Performed at [facility], [place] [address] [place], [place] [address]    Special Requests   Final    BOTTLES DRAWN AEROBIC AND ANAEROBIC Blood Culture adequate volume Performed at [place] [address], [place], [place] [address]    Culture   Final    NO GROWTH 5 DAYS Performed at [facility], [place] [address] [place], [place] [address]    Report Status 07/19/2019 FINAL  Final  SARS Coronavirus 2 [facility] order, Performed in [facility] hospital lab) Nasopharyngeal Nasopharyngeal Swab     Status: None   Collection Time: 07/14/19  3:23 PM   Specimen: Nasopharyngeal Swab  Result Value Ref Range Status   SARS Coronavirus 2 NEGATIVE NEGATIVE Final    Comment: (NOTE) If result is NEGATIVE SARS-CoV-2 target nucleic acids are NOT DETECTED. The SARS-CoV-2 RNA is generally detectable in upper and lower  respiratory specimens during the acute phase of infection. The lowest  concentration of SARS-CoV-2 viral copies this assay can detect is 250  copies / mL. A negative result does not preclude SARS-CoV-2 infection  and should not be used as the sole basis for treatment or other  patient management  decisions.  A negative result may occur with  improper specimen collection / handling, submission of specimen other  than nasopharyngeal swab, presence of viral mutation(s) within the  areas targeted  by this assay, and inadequate number of viral copies  (<250 copies / mL). A negative result must be combined with clinical  observations, patient history, and epidemiological information. If result is POSITIVE SARS-CoV-2 target nucleic acids are DETECTED. The SARS-CoV-2 RNA is generally detectable in upper and lower  respiratory specimens dur ing the acute phase of infection.  Positive  results are indicative of active infection with SARS-CoV-2.  Clinical  correlation with patient history and other diagnostic information is  necessary to determine patient infection status.  Positive results do  not rule out bacterial infection or co-infection with other viruses. If result is PRESUMPTIVE POSTIVE SARS-CoV-2 nucleic acids MAY BE PRESENT.   A presumptive positive result was obtained on the submitted specimen  and confirmed on repeat testing.  While 2019 novel coronavirus  (SARS-CoV-2) nucleic acids may be present in the submitted sample  additional confirmatory testing may be necessary for epidemiological  and / or clinical management purposes  to differentiate between  SARS-CoV-2 and other Sarbecovirus currently known to infect humans.  If clinically indicated additional testing with an alternate test  methodology [phone]) is advised. The SARS-CoV-2 RNA is generally  detectable in upper and lower respiratory sp ecimens during the acute  phase of infection. The expected result is Negative. Fact Sheet for Patients:  StrictlyIdeas.no Fact Sheet for Healthcare Providers: BankingDealers.co.za This test is not yet approved or cleared by the [place] FDA and has been authorized for detection and/or diagnosis of SARS-CoV-2 by FDA under an  Emergency Use Authorization (EUA).  This EUA will remain in effect (meaning this test can be used) for the duration of the COVID-19 declaration under Section 564(b)(1) of the Act, 21 U.S.C. section 360bbb-3(b)(1), unless the authorization is terminated or revoked sooner. Performed at [facility], [place] [address] [place], [place] [address]   Urine culture     Status: None   Collection Time: 07/14/19  6:11 PM   Specimen: Urine, Random  Result Value Ref Range Status   Specimen Description   Final    URINE, RANDOM Performed at [place] [address] [place], [place] [address]    Special Requests   Final    NONE Performed at [facility], [place] [address], [place], [place] [address]    Culture   Final    NO GROWTH Performed at [facility], [place] [address], [place], [place] [address]    Report Status 07/15/2019 FINAL  Final  MRSA PCR Screening     Status: None   Collection Time: 07/14/19  9:23 PM   Specimen: Nasal Mucosa; Nasopharyngeal  Result Value Ref Range Status   MRSA by PCR NEGATIVE NEGATIVE Final    Comment:        The GeneXpert MRSA Assay (FDA approved for NASAL specimens only), is one component of a comprehensive MRSA colonization surveillance program. It is not intended to diagnose MRSA infection nor to guide or monitor treatment for MRSA infections. Performed at [facility], [place] [address], [place], [place] [address]   Culture, blood (routine x 2)     Status: Abnormal (Preliminary result)   Collection Time: 07/15/19 12:31 AM   Specimen: BLOOD  Result Value Ref Range Status   Specimen Description   Final    BLOOD RIGHT WRIST Performed at [place] [address] [place], [place] [address]    Special Requests   Final    BOTTLES DRAWN AEROBIC AND  ANAEROBIC Blood Culture adequate volume Performed at [place] [address], [place], [place] [address]    Culture  Setup Time   Final    GRAM POSITIVE COCCI IN BOTH AEROBIC AND ANAEROBIC BOTTLES CRITICAL RESULT CALLED TO, READ BACK BY AND VERIFIED WITH: PHARMD E JACKSON [id number] AT 1420 BY CM    Culture (A)  Final    STAPHYLOCOCCUS SPECIES (COAGULASE NEGATIVE) SUSCEPTIBILITIES TO FOLLOW Performed at [facility], [place] [address], [place], [place] [address]    Report Status PENDING  Incomplete  Blood Culture ID Panel (Reflexed)     Status: Abnormal   Collection Time: 07/15/19 12:31 AM  Result Value Ref Range Status   Enterococcus species NOT DETECTED NOT DETECTED Final   Listeria monocytogenes NOT DETECTED NOT DETECTED Final   Staphylococcus species DETECTED (A) NOT DETECTED Final    Comment: Methicillin (oxacillin) resistant coagulase negative staphylococcus. Possible blood culture contaminant (unless isolated from more than one blood culture draw or clinical case suggests pathogenicity). No antibiotic treatment is indicated for blood  culture contaminants. CRITICAL RESULT CALLED TO, READ BACK BY AND VERIFIED WITH: PHARMD E JACKSON [id number] AT 1420 BY CM    Staphylococcus aureus (BCID) NOT DETECTED NOT DETECTED Final   Methicillin resistance DETECTED (A) NOT DETECTED Final    Comment: CRITICAL RESULT CALLED TO, READ BACK BY AND VERIFIED WITH: PHARMD E JACKSON [id number] AT 1420 BY CM    Streptococcus species NOT DETECTED NOT DETECTED Final   Streptococcus agalactiae NOT DETECTED NOT DETECTED Final   Streptococcus pneumoniae NOT DETECTED NOT DETECTED Final   Streptococcus pyogenes NOT DETECTED NOT DETECTED Final   Acinetobacter baumannii NOT DETECTED NOT DETECTED Final   Enterobacteriaceae species NOT DETECTED NOT DETECTED Final   Enterobacter cloacae complex NOT DETECTED NOT DETECTED Final   Escherichia coli NOT DETECTED NOT DETECTED Final   Klebsiella oxytoca NOT DETECTED NOT DETECTED Final   Klebsiella pneumoniae NOT DETECTED NOT DETECTED Final    Proteus species NOT DETECTED NOT DETECTED Final   Serratia marcescens NOT DETECTED NOT DETECTED Final   Haemophilus influenzae NOT DETECTED NOT DETECTED Final   Neisseria meningitidis NOT DETECTED NOT DETECTED Final   Pseudomonas aeruginosa NOT DETECTED NOT DETECTED Final   Candida albicans NOT DETECTED NOT DETECTED Final   Candida glabrata NOT DETECTED NOT DETECTED Final   Candida krusei NOT DETECTED NOT DETECTED Final   Candida parapsilosis NOT DETECTED NOT DETECTED Final   Candida tropicalis NOT DETECTED NOT DETECTED Final    Comment: Performed at [facility], [address], [place], [place] [address]  Culture, blood (routine x 2)     Status: None (Preliminary result)   Collection Time: 07/16/19  3:03 PM   Specimen: BLOOD  Result Value Ref Range Status   Specimen Description   Final    BLOOD RIGHT ANTECUBITAL Performed at [place] [address] [place], [place] [address]    Special Requests   Final    BOTTLES DRAWN AEROBIC ONLY Blood Culture adequate volume Performed at [place] [address] [place], [place] [address]    Culture   Final    NO GROWTH 3 DAYS Performed at [facility], [place] [address] [place], [place] [address]    Report Status PENDING  Incomplete  Culture, blood (routine x 2)     Status: None (Preliminary result)   Collection Time: 07/16/19  3:03 PM   Specimen: BLOOD RIGHT HAND  Result Value Ref Range Status   Specimen Description   Final    BLOOD RIGHT HAND Performed at [place] [address] [place], [place] [address]    Special Requests   Final    BOTTLES DRAWN AEROBIC ONLY Blood Culture adequate volume Performed at [place] [address], [place], [place] [address]    Culture   Final    NO GROWTH 3 DAYS Performed at [facility], [place] [address] [place], [place] [address]    Report Status PENDING  Incomplete          Radiology Studies: Dg Chest 2 View  Result Date: 07/17/2019 CLINICAL DATA:  Weakness, shortness of breath EXAM: CHEST - 2 VIEW COMPARISON:  07/14/2019 FINDINGS: Left Port-A-Cath remains in place, unchanged. Heart and mediastinal contours are within normal limits. No focal opacities or effusions. No acute bony abnormality. Old mild compression fracture in the lower thoracic spine. IMPRESSION: He Electronically Signed   By: Rolm Baptise M.D.   On: 07/17/2019 16:34        Scheduled Meds: . diltiazem  180 mg Oral Daily  . enoxaparin (LOVENOX) injection  40 mg Subcutaneous QHS  . feeding supplement (ENSURE ENLIVE)  237 mL Oral BID BM  . hydrocortisone   Rectal BID  . levothyroxine  50 mcg Oral Q0600  . metoCLOPramide  5 mg Oral TID AC & HS  . mirtazapine  7.5 mg Oral QHS  . multivitamin with minerals  1 tablet Oral Daily  . pantoprazole  40 mg Oral BID   Continuous Infusions: . piperacillin-tazobactam (ZOSYN)  IV 3.375 g (07/19/19 0854)  . vancomycin 1,000 mg (07/18/19 1527)     LOS: 5 days    Time spent: 35 min    Nicolette Bang, MD [facility]  If 7PM-7AM, please contact night-coverage  07/19/2019, 12:41 PM

## 2019-07-19 NOTE — Evaluation (Signed)
Clinical/Bedside Swallow Evaluation Patient Details  Name: Julie Rollins MRN: [id number] Date of Birth: 05-12-1946  Today's Date: 07/19/2019 Time: SLP Start Time (ACUTE ONLY): 1215 SLP Stop Time (ACUTE ONLY): 1250 SLP Time Calculation (min) (ACUTE ONLY): 35 min  Past Medical History:  Past Medical History:  Diagnosis Date  . Anxiety   . Blood transfusion without reported diagnosis   . Breast CA ([place])    s/p lumpectomy and radiation 2000  . Breast cancer ([place])   . Colitis    2007  . Dyspnea   . Dysrhythmia   . Family history of bladder cancer   . Family history of stomach cancer   . Family history of throat cancer   . Hernia, epigastric   . HTN (hypertension)    "mild hypertension", was on BP med years ago but it caused orthostatic hypotension and she has not been medicated since  . Hypothyroid   . Pancreatic cancer ([place]) dx'd 03/2019  . Personal history of breast cancer   . Personal history of radiation therapy 2001  . Poor appetite 04/2019  . Vertigo   . Vertigo    Past Surgical History:  Past Surgical History:  Procedure Laterality Date  . ABDOMINAL HYSTERECTOMY     8yrs ago  . ANTERIOR CERVICAL DECOMP/DISCECTOMY FUSION N/A 08/02/2018   Procedure: ANTERIOR CERVICAL DECOMPRESSION/DISCECTOMY FUSION CERVICAL FIVE- CERVICAL SIX;  Surgeon: Earnie Larsson, MD;  Location: [facility];  Service: Neurosurgery;  Laterality: N/A;  ANTERIOR CERVICAL DECOMPRESSION/DISCECTOMY FUSION CERVICAL FIVE- CERVICAL SIX  . BILIARY BRUSHING  03/29/2019   Procedure: BILIARY BRUSHING;  Surgeon: [name], MD;  Location: [facility] ENDOSCOPY;  Service: Endoscopy;;  . BILIARY STENT PLACEMENT  03/29/2019   Procedure: BILIARY STENT PLACEMENT;  Surgeon: [name], MD;  Location: [facility] ENDOSCOPY;  Service: Endoscopy;;  . BIOPSY  03/29/2019   Procedure: BIOPSY;  Surgeon: [name], MD;  Location: [facility] ENDOSCOPY;  Service: Endoscopy;;  . BREAST LUMPECTOMY     2000  . ERCP N/A 03/29/2019   Procedure: ENDOSCOPIC  RETROGRADE CHOLANGIOPANCREATOGRAPHY (ERCP);  Surgeon: [name], MD;  Location: [facility] ENDOSCOPY;  Service: Endoscopy;  Laterality: N/A;  . ESOPHAGOGASTRODUODENOSCOPY N/A 06/17/2019   Procedure: ESOPHAGOGASTRODUODENOSCOPY (EGD);  Surgeon: Gatha Mayer, MD;  Location: [facility] ENDOSCOPY;  Service: Gastroenterology;  Laterality: N/A;  . ESOPHAGOGASTRODUODENOSCOPY (EGD) WITH PROPOFOL N/A 03/29/2019   Procedure: ESOPHAGOGASTRODUODENOSCOPY (EGD) WITH PROPOFOL;  Surgeon: [name], MD;  Location: [facility] ENDOSCOPY;  Service: Endoscopy;  Laterality: N/A;  . ESOPHAGOGASTRODUODENOSCOPY (EGD) WITH PROPOFOL N/A 06/09/2019   Procedure: ESOPHAGOGASTRODUODENOSCOPY (EGD) WITH PROPOFOL;  Surgeon: [name], MD;  Location: [facility] ENDOSCOPY;  Service: Endoscopy;  Laterality: N/A;  . EUS N/A 03/29/2019   Procedure: UPPER ENDOSCOPIC ULTRASOUND (EUS) LINEAR;  Surgeon: [name], MD;  Location: [facility] ENDOSCOPY;  Service: Endoscopy;  Laterality: N/A;  . EUS N/A 06/09/2019   Procedure: UPPER ENDOSCOPIC ULTRASOUND (EUS) LINEAR;  Surgeon: [name], MD;  Location: [facility] ENDOSCOPY;  Service: Endoscopy;  Laterality: N/A;  . FIDUCIAL MARKER PLACEMENT  06/09/2019   Procedure: FIDUCIAL MARKER PLACEMENT;  Surgeon: [name], MD;  Location: [facility] ENDOSCOPY;  Service: Endoscopy;;  . FINE NEEDLE ASPIRATION  03/29/2019   Procedure: FINE NEEDLE ASPIRATION (FNA);  Surgeon: [name], MD;  Location: [facility] ENDOSCOPY;  Service: Endoscopy;;  . HERNIA REPAIR     20+ years ago  . PORTACATH PLACEMENT N/A 04/12/2019   Procedure: INSERTION PORT-A-CATH LEFT SUBCLAVIAN;  Surgeon: Stark Klein, MD;  Location:  [place];  Service: General;  Laterality: N/A;  . RETINAL DETACHMENT SURGERY    . SPHINCTEROTOMY  03/29/2019   Procedure: SPHINCTEROTOMY;  Surgeon: [name], MD;  Location: [facility] ENDOSCOPY;  Service: Endoscopy;;   HPI:  [name], 72y/f, with recent admit for significant nausea and vomiting now  admitted with sepsis. PMH  hypothyroidism of breast cancer s/p lumpectomy and radiation in 2000, HTN, and recent cancer of pancreatic head diagnosed in early May after presenting to hospital with jaundice s/p ERCP with CBD stenting 03/29/19. EGD previously revealed a partial duodenal obstruction presumably from mass effect from tumor/stent.  Pt was taking in TPN at home and consuming liquid diet prior to admit.  Swallow eval ordered due to concerns for pt possibly aspirating with concerns for RLL possible pna.  Pt denies dysphagia except to large pills.   Assessment / Plan / Recommendation Clinical Impression  Patient presents with normal swallow function again. She passed [facility] 3 ounce water test without indication of aspiration.  Bedside swallow evaluation was limited to water only due to diet restrictions of full liquids only and pt not wanting to eat until her lunch arrived. Pt had no s/sx of aspiration, coughing or change in vocal quality with various quantitities.  She denies dysphagia except to large pills - does have h/o cervical spine surgery.   Pt states large pills lodge at times in her throat. SLP advised her to alternative ways to take medications - large pills.  Pt reports more problems with vitamins and she consumes chewable ones at home.  Xerostomia reported by pt - which SLP advised to compensation strategies.  Also advised small frequent meals and reflux precautions due to her pancreatic cancer, h/o GOO.  All information provided in writing and SLP wrote down options for pt with full liquid diet while in hospital per service response personnel.   Thanks for this consult, nol follow up indicated. SLP Visit Diagnosis: Dysphagia, oropharyngeal phase (R13.12)    Aspiration Risk  Moderate aspiration risk    Diet Recommendation Thin liquid   Liquid Administration via: Cup;Spoon Medication Administration: Whole meds with liquid(liquids, IV) Supervision: Patient able to self feed Compensations:  Slow rate;Small sips/bites    Other  Recommendations Oral Care Recommendations: Oral care BID   Follow up Recommendations None      Frequency and Duration            Prognosis Prognosis for Safe Diet Advancement: Good      Swallow Study   General Date of Onset: 06/15/19 HPI: [name], 72y/f, with recent admit for significant nausea and vomiting now admitted with sepsis. PMH  hypothyroidism of breast cancer s/p lumpectomy and radiation in 2000, HTN, and recent cancer of pancreatic head diagnosed in early May after presenting to hospital with jaundice s/p ERCP with CBD stenting 03/29/19. EGD previously revealed a partial duodenal obstruction presumably from mass effect from tumor/stent.  Pt was taking in TPN at home and consuming liquid diet prior to admit.  Swallow eval ordered due to concerns for pt possibly aspirating with concerns for RLL possible pna.  Pt denies dysphagia except to large pills. Type of Study: Bedside Swallow Evaluation Previous Swallow Assessment: prior BSE one month ago Diet Prior to this Study: Thin liquids Respiratory Status: Room air History of Recent Intubation: No Behavior/Cognition: Alert;Cooperative;Pleasant mood Oral Cavity Assessment: Within Functional Limits Oral Care Completed by SLP: No Oral Cavity - Dentition: Adequate natural dentition Vision: Functional for self-feeding Self-Feeding  Abilities: Able to feed self Patient Positioning: Upright in bed Baseline Vocal Quality: Normal Volitional Cough: Strong Volitional Swallow: Able to elicit    Oral/Motor/Sensory Function Overall Oral Motor/Sensory Function: Within functional limits   Ice Chips Ice chips: Not tested   Thin Liquid Thin Liquid: Within functional limits Presentation: Straw;Cup Other Comments: pt passed 3 ounce yale water test easily    Nectar Thick Nectar Thick Liquid: Not tested   Honey Thick Honey Thick Liquid: Not tested   Puree Puree: Not tested   Solid     Solid:  Not tested      Macario Golds 07/19/2019,1:27 PM  Luanna Salk, [facility] SLP Acute Rehab Services Pager [phone] Office [phone]

## 2019-07-20 DIAGNOSIS — J189 Pneumonia, unspecified organism: Secondary | ICD-10-CM

## 2019-07-20 LAB — CREATININE, SERUM
Creatinine, Ser: 0.87 mg/dL (ref 0.44–1.00)
GFR calc Af Amer: >60 mL/min (ref >60)
GFR calc non Af Amer: >60 mL/min (ref >60)

## 2019-07-20 LAB — CBC
HCT: 27.3 % — ABNORMAL LOW (ref 36.0–46.0)
Hemoglobin: 8.8 g/dL — ABNORMAL LOW (ref 12.0–15.0)
MCH: 31.4 pg (ref 26.0–34.0)
MCHC: 32.2 g/dL (ref 30.0–36.0)
MCV: 97.5 fL (ref 80.0–100.0)
Platelets: 254 10*3/uL (ref 150–400)
RBC: 2.8 MIL/uL — ABNORMAL LOW (ref 3.87–5.11)
RDW: 14.1 % (ref 11.5–15.5)
WBC: 5 10*3/uL (ref 4.0–10.5)
nRBC: 0 % (ref 0.0–0.2)

## 2019-07-20 LAB — BASIC METABOLIC PANEL
Anion gap: 10 (ref 5–15)
BUN: 8 mg/dL (ref 8–23)
CO2: 25 mmol/L (ref 22–32)
Calcium: 8.3 mg/dL — ABNORMAL LOW (ref 8.9–10.3)
Chloride: 104 mmol/L (ref 98–111)
Creatinine, Ser: 1.01 mg/dL — ABNORMAL HIGH (ref 0.44–1.00)
GFR calc Af Amer: >60 mL/min (ref >60)
GFR calc non Af Amer: 54 mL/min — ABNORMAL LOW (ref >60)
Glucose, Bld: 187 mg/dL — ABNORMAL HIGH (ref 70–99)
Potassium: 3.1 mmol/L — ABNORMAL LOW (ref 3.5–5.1)
Sodium: 139 mmol/L (ref 135–145)

## 2019-07-20 LAB — VANCOMYCIN, PEAK: Vancomycin Pk: 44 ug/mL — ABNORMAL HIGH (ref 30–40)

## 2019-07-20 MED ORDER — POTASSIUM CHLORIDE 20 MEQ/15ML (10%) PO SOLN
40.0000 meq | Freq: Once | ORAL | Status: AC
  Administered 2019-07-20: 40 meq via ORAL
  Filled 2019-07-20: qty 30

## 2019-07-20 NOTE — Progress Notes (Signed)
PROGRESS NOTE    Julie Rollins  [id number] DOB: Apr 13, 1946 DOA: 07/14/2019 PCP: Nolene Ebbs, MD     Brief Narrative:  Julie Rollins is a 73 year old female with prior history of pancreatic cancer , s/p partialgastric outlet obstruction on oral liquid diet and TPN at home, hypertension, hypothyroidism, anemia of chronic disease to ED for fever. On arrival to ED she was found to have right lower lobe pneumonia,she was started on IV Rocephin and azithromycin.Her blood cultures grew coag neg staph in one set of blood cultures and antibiotics changed to IV vancomycin and IV zosyn.  Because of presence of port as well as chemo and radiation with immunosuppression, weekend covering team requested sensitivities.   New events last 24 hours / Subjective: No new issues, tolerating liquid diet. Afebrile.   Assessment & Plan:   Active Problems:   Carcinoma of head of pancreas ([place])   Sepsis due to undetermined organism ([place])   Sepsis secondary to community acquired pneumonia vs aspiration pneumonia -Pt received IV Rocephin and IV Zithromax on 8/20 to 8/21, antibiotics were changed to IV vancomycin (blood cultures were positive for coag neg staph) on 8/22 and IV zosyn added on 8/23 for gram neg coverage and anaerobes -SLP eval revealed moderate aspiration risk  Coag negative staph bacteremia -Positive in one set of blood cultures, in both aerobic and anaerobic bottles drawn on 8/21 at 12:31 am.  -In view of her immunocompromised state, with IV port on TPN, we are treating it as true infection. 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  Continue Cardizem  Hypothyroidism -Continue synthroid  Anemia of chronic disease and malignancy  -Hgb stable   Hypokalemia -Replace, trend    DVT prophylaxis: Lovenox Code Status: Full Family Communication: None Disposition Plan: Pending sensitivity of blood culture results   Consultants:   None  Procedures:   None   Antimicrobials:  Anti-infectives (From admission, onward)   Start     Dose/Rate Route Frequency Ordered Stop   07/17/19 1600  piperacillin-tazobactam (ZOSYN) IVPB 3.375 g     3.375 g 12.5 mL/hr over 240 Minutes Intravenous Every 8 hours 07/17/19 1532     07/16/19 1600  vancomycin (VANCOCIN) IVPB 1000 mg/200 mL premix     1,000 mg 200 mL/hr over 60 Minutes Intravenous Every 24 hours 07/16/19 1439     07/14/19 2130  cefTRIAXone (ROCEPHIN) 1 g in sodium chloride 0.9 % 100 mL IVPB  Status:  Discontinued     1 g 200 mL/hr over 30 Minutes Intravenous Every 24 hours 07/14/19 2123 07/16/19 1447   07/14/19 2130  azithromycin (ZITHROMAX) 500 mg in sodium chloride 0.9 % 250 mL IVPB  Status:  Discontinued     500 mg 250 mL/hr over 60 Minutes Intravenous Every 24 hours 07/14/19 2123 07/16/19 1447   07/14/19 1630  cefTRIAXone (ROCEPHIN) 1 g in sodium chloride 0.9 % 100 mL IVPB     1 g 200 mL/hr over 30 Minutes Intravenous  Once 07/14/19 1625 07/14/19 1952   07/14/19 1630  azithromycin (ZITHROMAX) 500 mg in sodium chloride 0.9 % 250 mL IVPB     500 mg 250 mL/hr over 60 Minutes Intravenous  Once 07/14/19 1625 07/14/19 1750        Objective: Vitals:   07/19/19 1515 07/19/19 2054 07/20/19 0622 07/20/19 0924  BP: (!) 119/57 (!) 123/56 127/62 125/69  Pulse: 83 83 78 81  Resp: 18 18 18    Temp: 97.7 F (36.5 C) 98.1 F (36.7 C) 97.7 F (36.5 C)   TempSrc: Oral Oral Oral   SpO2: 95% 94%  94%   Weight:      Height:        Intake/Output Summary (Last 24 hours) at 07/20/2019 1152 Last data filed at 07/20/2019 1000 Gross per 24 hour  Intake 1440 ml  Output -  Net 1440 ml   Filed Weights   07/14/19 1412 07/14/19 2327  Weight: 54.4 kg 57.3 kg    Examination:  General exam: Appears calm and comfortable  Respiratory system: Clear to auscultation. Respiratory effort normal. No respiratory distress. No conversational dyspnea.  Cardiovascular system: S1 & S2 heard, RRR. No murmurs. No pedal edema. Gastrointestinal system: Abdomen is nondistended, soft and nontender. Normal bowel sounds heard. Central nervous system: Alert and oriented. No focal neurological deficits. Speech clear.  Extremities: Symmetric in appearance  Skin: No rashes, lesions or ulcers on exposed skin  Psychiatry: Judgement and insight appear normal. Mood & affect appropriate.   Data Reviewed: I have personally reviewed following labs and imaging studies  CBC: Recent Labs  Lab 07/14/19 1506 07/15/19 0031 07/17/19 1640 07/18/19 0406 07/20/19 1105  WBC 5.6 7.5 2.8* 3.8* 5.0  NEUTROABS 4.7  --  1.5* 2.6  --   HGB 10.9* 9.7* 9.1* 9.2* 8.8*  HCT 33.2* 30.4* 28.7* 28.9* 27.3*  MCV 94.6 97.7 95.3 96.7 97.5  PLT 343 201 215 189 0000000   Basic Metabolic Panel: Recent Labs  Lab 07/14/19 1544 07/17/19 1640 07/18/19 0406 07/19/19 0438 07/20/19 0520 07/20/19 1105  NA 133* 138 142  --   --  139  K 4.7 2.8* 3.7  --   --  3.1*  CL 103 108 111  --   --  104  CO2 22 24 24   --   --  25  GLUCOSE 151* 211* 131*  --   --  187*  BUN 16 7* 8  --   --  8  CREATININE 0.52 0.45 0.83 0.99 0.87 1.01*  CALCIUM 7.7* 8.3* 8.8*  --   --  8.3*  MG  --   --  2.1  --   --   --   PHOS  --   --  4.4  --   --   --    GFR: Estimated Creatinine Clearance: 41.6 mL/min (A) (by C-G formula based on SCr of 1.01 mg/dL (H)). Liver Function Tests: Recent Labs  Lab 07/14/19 1544 07/17/19 1640 07/18/19 0406  AST 45* 26 27   ALT 20 26 26   ALKPHOS 102 102 102  BILITOT 1.5* 0.4 0.4  PROT 5.4* 5.5* 5.6*  ALBUMIN 2.2* 2.3* 2.4*   Recent Labs  Lab 07/14/19 1544  LIPASE 45   No results for input(s): AMMONIA in the last 168 hours. Coagulation Profile: No results for input(s): INR, PROTIME in the last 168 hours. Cardiac Enzymes: No results for input(s): CKTOTAL, CKMB, CKMBINDEX, TROPONINI in the last 168 hours. BNP (last 3 results) No results  for input(s): PROBNP in the last 8760 hours. HbA1C: No results for input(s): HGBA1C in the last 72 hours. CBG: No results for input(s): GLUCAP in the last 168 hours. Lipid Profile: Recent Labs    07/18/19 0406  TRIG 120   Thyroid Function Tests: No results for input(s): TSH, T4TOTAL, FREET4, T3FREE, THYROIDAB in the last 72 hours. Anemia Panel: No results for input(s): VITAMINB12, FOLATE, FERRITIN, TIBC, IRON, RETICCTPCT in the last 72 hours. Sepsis Labs: Recent Labs  Lab 07/14/19 1509 07/14/19 1659 07/15/19 0031 07/16/19 0521  PROCALCITON  --   --  0.43 0.32  LATICACIDVEN 2.3* 1.5  --   --     Recent Results (from the past 240 hour(s))  Culture, blood (routine x 2)     Status: None   Collection Time: 07/14/19  2:47 PM   Specimen: BLOOD  Result Value Ref Range Status   Specimen Description   Final    BLOOD RIGHT HAND Performed at [facility], [place] [address] [place], [place] [address]    Special Requests   Final    BOTTLES DRAWN AEROBIC AND ANAEROBIC Blood Culture adequate volume Performed at [place] [address], [place], [place] [address]    Culture   Final    NO GROWTH 5 DAYS Performed at [facility], [place] [address], [place], [place] [address]    Report Status 07/19/2019 FINAL  Final  SARS Coronavirus 2 [facility] order, Performed in [facility] hospital lab) Nasopharyngeal Nasopharyngeal Swab     Status: None   Collection Time: 07/14/19  3:23 PM   Specimen: Nasopharyngeal Swab  Result  Value Ref Range Status   SARS Coronavirus 2 NEGATIVE NEGATIVE Final    Comment: (NOTE) If result is NEGATIVE SARS-CoV-2 target nucleic acids are NOT DETECTED. The SARS-CoV-2 RNA is generally detectable in upper and lower  respiratory specimens during the acute phase of infection. The lowest  concentration of SARS-CoV-2 viral copies this assay can detect is 250  copies / mL. A negative result does not preclude SARS-CoV-2 infection  and should not be used as the sole basis for treatment or other  patient management decisions.  A negative result may occur with  improper specimen collection / handling, submission of specimen other  than nasopharyngeal swab, presence of viral mutation(s) within the  areas targeted by this assay, and inadequate number of viral copies  (<250 copies / mL). A negative result must be combined with clinical  observations, patient history, and epidemiological information. If result is POSITIVE SARS-CoV-2 target nucleic acids are DETECTED. The SARS-CoV-2 RNA is generally detectable in upper and lower  respiratory specimens dur ing the acute phase of infection.  Positive  results are indicative of active infection with SARS-CoV-2.  Clinical  correlation with patient history and other diagnostic information is  necessary to determine patient infection status.  Positive results do  not rule out bacterial infection or co-infection with other viruses. If result is PRESUMPTIVE POSTIVE SARS-CoV-2 nucleic acids MAY BE PRESENT.   A presumptive positive result was obtained on the submitted specimen  and confirmed on repeat testing.  While 2019 novel coronavirus  (SARS-CoV-2) nucleic acids may be present in the submitted sample  additional confirmatory testing may be necessary for epidemiological  and / or clinical management purposes  to differentiate between  SARS-CoV-2 and other Sarbecovirus currently known to infect humans.  If clinically indicated additional testing  with an alternate test  methodology [phone]) is advised.  The SARS-CoV-2 RNA is generally  detectable in upper and lower respiratory sp ecimens during the acute  phase of infection. The expected result is Negative. Fact Sheet for Patients:  StrictlyIdeas.no Fact Sheet for Healthcare Providers: BankingDealers.co.za This test is not yet approved or cleared by the [place] FDA and has been authorized for detection and/or diagnosis of SARS-CoV-2 by FDA under an Emergency Use Authorization (EUA).  This EUA will remain in effect (meaning this test can be used) for the duration of the COVID-19 declaration under Section 564(b)(1) of the Act, 21 U.S.C. section 360bbb-3(b)(1), unless the authorization is terminated or revoked sooner. Performed at [facility], [place] [address], [place], [place] [address]   Urine culture     Status: None   Collection Time: 07/14/19  6:11 PM   Specimen: Urine, Random  Result Value Ref Range Status   Specimen Description   Final    URINE, RANDOM Performed at [place] [address] [place], [place] [address]    Special Requests   Final    NONE Performed at [facility] [place] [address] [place], [place] [address]    Culture   Final    NO GROWTH Performed at [facility], [place] [address] [place], [place] [address]    Report Status 07/15/2019 FINAL  Final  MRSA PCR Screening     Status: None   Collection Time: 07/14/19  9:23 PM   Specimen: Nasal Mucosa; Nasopharyngeal  Result Value Ref Range Status   MRSA by PCR NEGATIVE NEGATIVE Final    Comment:        The GeneXpert MRSA Assay (FDA approved for NASAL specimens only), is one component of a comprehensive MRSA colonization surveillance program. It is not intended to diagnose MRSA infection nor to guide or monitor treatment for MRSA infections. Performed at [facility], [place] [address], [place], [place] [address]   Culture, blood (routine x 2)     Status: Abnormal (Preliminary result)   Collection Time: 07/15/19 12:31 AM   Specimen: BLOOD  Result Value Ref Range Status   Specimen Description   Final    BLOOD RIGHT WRIST Performed at [place] [address] [place], [place] [address]    Special Requests   Final    BOTTLES DRAWN AEROBIC AND ANAEROBIC Blood Culture adequate volume Performed at [place] [address] [place], [place] [address]    Culture  Setup Time   Final    GRAM POSITIVE COCCI IN BOTH AEROBIC AND ANAEROBIC BOTTLES CRITICAL RESULT CALLED TO, READ BACK BY AND VERIFIED WITH: PHARMD E JACKSON [id number] AT 1420 BY CM    Culture (A)  Final    STAPHYLOCOCCUS SPECIES (COAGULASE NEGATIVE) REPEATING SUSCEPTIBILITIES TO FOLLOW Performed at [facility], [place] [address], [place], [place] [address]    Report Status PENDING  Incomplete  Blood Culture ID Panel (Reflexed)     Status: Abnormal   Collection Time: 07/15/19 12:31 AM  Result Value Ref Range Status   Enterococcus species NOT DETECTED NOT DETECTED Final   Listeria monocytogenes NOT DETECTED NOT DETECTED Final   Staphylococcus species DETECTED (A) NOT DETECTED Final    Comment: Methicillin (oxacillin) resistant coagulase negative staphylococcus. Possible blood culture contaminant (unless isolated from more than one blood culture draw or clinical case suggests pathogenicity). No antibiotic treatment is indicated for blood  culture contaminants. CRITICAL RESULT CALLED TO, READ BACK BY  AND VERIFIED WITH: PHARMD E JACKSON [id number] AT 1420 BY CM    Staphylococcus aureus (BCID) NOT DETECTED NOT DETECTED Final   Methicillin resistance DETECTED (A) NOT DETECTED Final    Comment: CRITICAL RESULT CALLED TO, READ BACK BY AND VERIFIED WITH: PHARMD E JACKSON [id number] AT 1420 BY CM    Streptococcus species NOT DETECTED NOT DETECTED Final    Streptococcus agalactiae NOT DETECTED NOT DETECTED Final   Streptococcus pneumoniae NOT DETECTED NOT DETECTED Final   Streptococcus pyogenes NOT DETECTED NOT DETECTED Final   Acinetobacter baumannii NOT DETECTED NOT DETECTED Final   Enterobacteriaceae species NOT DETECTED NOT DETECTED Final   Enterobacter cloacae complex NOT DETECTED NOT DETECTED Final   Escherichia coli NOT DETECTED NOT DETECTED Final   Klebsiella oxytoca NOT DETECTED NOT DETECTED Final   Klebsiella pneumoniae NOT DETECTED NOT DETECTED Final   Proteus species NOT DETECTED NOT DETECTED Final   Serratia marcescens NOT DETECTED NOT DETECTED Final   Haemophilus influenzae NOT DETECTED NOT DETECTED Final   Neisseria meningitidis NOT DETECTED NOT DETECTED Final   Pseudomonas aeruginosa NOT DETECTED NOT DETECTED Final   Candida albicans NOT DETECTED NOT DETECTED Final   Candida glabrata NOT DETECTED NOT DETECTED Final   Candida krusei NOT DETECTED NOT DETECTED Final   Candida parapsilosis NOT DETECTED NOT DETECTED Final   Candida tropicalis NOT DETECTED NOT DETECTED Final    Comment: Performed at [facility], [address], [place], [place] [address]  Culture, blood (routine x 2)     Status: None (Preliminary result)   Collection Time: 07/16/19  3:03 PM   Specimen: BLOOD  Result Value Ref Range Status   Specimen Description   Final    BLOOD RIGHT ANTECUBITAL Performed at [place] [address] [place], [place] [address]    Special Requests   Final    BOTTLES DRAWN AEROBIC ONLY Blood Culture adequate volume Performed at [place] [address] [place], [place] [address]    Culture   Final    NO GROWTH 4 DAYS Performed at [facility], [place] [address], [place], [place] [address]    Report Status PENDING  Incomplete  Culture, blood (routine x 2)     Status: None (Preliminary result)   Collection Time: 07/16/19  3:03 PM   Specimen: BLOOD RIGHT HAND   Result Value Ref Range Status   Specimen Description   Final    BLOOD RIGHT HAND Performed at [place] [address] [place], [place] [address]    Special Requests   Final    BOTTLES DRAWN AEROBIC ONLY Blood Culture adequate volume Performed at [place] [address] [place], [place] [address]    Culture   Final    NO GROWTH 4 DAYS Performed at [facility], [place] [address] [place], [place] [address]    Report Status PENDING  Incomplete      Radiology Studies: No results found.    Scheduled Meds: . diltiazem  180 mg Oral Daily  . enoxaparin (LOVENOX) injection  40 mg Subcutaneous QHS  . feeding supplement (ENSURE ENLIVE)  237 mL Oral BID BM  . hydrocortisone   Rectal BID  . levothyroxine  50 mcg Oral Q0600  . metoCLOPramide  5 mg Oral TID AC & HS  . mirtazapine  7.5 mg Oral QHS  . multivitamin with minerals  1 tablet Oral Daily  . pantoprazole  40 mg  Oral BID   Continuous Infusions: . piperacillin-tazobactam (ZOSYN)  IV 3.375 g (07/20/19 0755)  . vancomycin 1,000 mg (07/19/19 1520)     LOS: 6 days      Time spent: 45 minutes   Dessa Phi, DO [facility] [email or web address] 07/20/2019, 11:52 AM

## 2019-07-20 NOTE — Progress Notes (Signed)
Pharmacy Antibiotic Note  Julie Rollins is a 73 y.o. female admitted on 07/14/2019 with fever and port not working. 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 Pharmacy consulted to assist with dosing of antibiotics.   Plan: Continue Vancomycin 1g IV q24h for now. Check Vancomycin peak/trough levels with dose today. Continue Zosyn 3.375g IV q8h, each dose infused over 4 hours.  Monitor renal function (daily SCr), cultures, clinical course, duration of therapy.   Height: 5\' 4"  (162.6 cm) Weight: 126 lb 5.2 oz (57.3 kg) IBW/kg (Calculated) : 54.7  Temp (24hrs), Avg:97.9 F (36.6 C), Min:97.7 F (36.5 C), Max:98.2 F (36.8 C)  Recent Labs  Lab 07/14/19 1506 07/14/19 1509  07/14/19 1659 07/15/19 0031 07/17/19 1640 07/18/19 0406 07/19/19 0438 07/20/19 0520 07/20/19 1105  WBC 5.6  --   --   --  7.5 2.8* 3.8*  --   --  5.0  CREATININE  --   --    < >  --   --  0.45 0.83 0.99 0.87 1.01*  LATICACIDVEN  --  2.3*  --  1.5  --   --   --   --   --   --    < > = values in this interval not displayed.    Estimated Creatinine Clearance: 41.6 mL/min (A) (by C-G formula based on SCr of 1.01 mg/dL (H)).    Allergies  Allergen Reactions  . Codeine Nausea Only    Antimicrobials this admission: 8/20 Azithromycin >> 8/22 8/20 Ceftriaxone >> 8/22 8/22 Vancomycin >> 8/23 Zosyn >>  Microbiology results: 8/20 COVID: neg  8/20 UCx: NGF 8/20 MRSA PCR: neg  8/20 BCx: NGF 8/21BCx:MR-CoNS in both bottles, susceptibilities pending  8/22 BCx: NGTD    Thank you for allowing pharmacy to be a part of this patient's care.   Lindell Spar, PharmD, BCPS Clinical Pharmacist  07/20/2019, 2:28 PM

## 2019-07-20 NOTE — Progress Notes (Signed)
Lab unable to accept vancomycin trough blood tube. Pharmacy made aware and per [facility] in pharmacy, ok to continue infusing vancomycin and will re-time the lab.

## 2019-07-21 LAB — BASIC METABOLIC PANEL
Anion gap: 10 (ref 5–15)
BUN: 8 mg/dL (ref 8–23)
CO2: 25 mmol/L (ref 22–32)
Calcium: 8.5 mg/dL — ABNORMAL LOW (ref 8.9–10.3)
Chloride: 103 mmol/L (ref 98–111)
Creatinine, Ser: 0.86 mg/dL (ref 0.44–1.00)
GFR calc Af Amer: >60 mL/min (ref >60)
GFR calc non Af Amer: >60 mL/min (ref >60)
Glucose, Bld: 101 mg/dL — ABNORMAL HIGH (ref 70–99)
Potassium: 3.7 mmol/L (ref 3.5–5.1)
Sodium: 138 mmol/L (ref 135–145)

## 2019-07-21 LAB — CULTURE, BLOOD (ROUTINE X 2)
Culture: NO GROWTH
Culture: NO GROWTH
Special Requests: ADEQUATE
Special Requests: ADEQUATE
Special Requests: ADEQUATE

## 2019-07-21 LAB — MAGNESIUM: Magnesium: 2.1 mg/dL (ref 1.7–2.4)

## 2019-07-21 MED ORDER — AMOXICILLIN-POT CLAVULANATE 875-125 MG PO TABS
1.0000 | ORAL_TABLET | Freq: Two times a day (BID) | ORAL | Status: DC
  Administered 2019-07-21: 12:00:00 1 via ORAL
  Filled 2019-07-21: qty 1

## 2019-07-21 MED ORDER — AMOXICILLIN-POT CLAVULANATE 875-125 MG PO TABS: 1.0000 | ORAL_TABLET | Freq: Two times a day (BID) | ORAL | 0 refills | Status: AC

## 2019-07-21 MED ORDER — HEPARIN SOD (PORK) LOCK FLUSH 100 UNIT/ML IV SOLN
500.0000 [IU] | INTRAVENOUS | Status: AC | PRN
  Administered 2019-07-21: 500 [IU]

## 2019-07-21 NOTE — TOC Transition Note (Signed)
Transition of Care [facility]) - CM/SW Discharge Note   Patient Details  Name: TAMICIA BELLAR MRN: [id number] Date of Birth: 12-31-1945  Transition of Care [facility]) CM/SW Contact:  Dessa Phi, RN Phone Number: 07/21/2019, 1:07 PM   Clinical Narrative:Per attending-HHC, & TPN to restart tomorrow-HHC agency [facility] rep Tommi Rumps, & [facility] rep Pam for infusion aware. Patient agree to d/c today, & HHC to start tomorrow. Patient to provide own transport home. No further CM needs.       Final next level of care: [place] Barriers to Discharge: No Barriers Identified   Patient Goals and CMS Choice Patient states their goals for this hospitalization and ongoing recovery are:: go home CMS Medicare.gov Compare Post Acute Care list provided to:: Patient Choice offered to / list presented to : Patient  Discharge Placement                       Discharge Plan and Services                          HH Arranged: RN, TPN HH Agency: ([facility]-TPN infusion;[facility]-TPN instruction) Date HH Agency Contacted: 07/21/19 Time [place]: 1122 Representative spoke with at [place]: Pam;Cory  Social Determinants of Health ([place]) Interventions     Readmission Risk Interventions No flowsheet data found.

## 2019-07-21 NOTE — TOC Transition Note (Signed)
Transition of Care [facility]) - CM/SW Discharge Note   Patient Details  Name: Julie Rollins MRN: [id number] Date of Birth: 1946/08/05  Transition of Care [facility]) CM/SW Contact:  Dessa Phi, RN Phone Number: 07/21/2019, 11:23 AM   Clinical Narrative: Spoke to patient about d/c plans-already active w/[facility]-rep Pam for TPN infusion-rep Pam will come to rm to instruct on TPN prior d/c;[facility]HRN-TPN instruction rep Tommi Rumps will have nurse Langley Gauss to see patient for HHRN-patient has had her in the past, & is comfortable. Patient will have own transport home. No further CM needs.     Final next level of care: [place] Barriers to Discharge: No Barriers Identified   Patient Goals and CMS Choice Patient states their goals for this hospitalization and ongoing recovery are:: go home CMS Medicare.gov Compare Post Acute Care list provided to:: Patient Choice offered to / list presented to : Patient  Discharge Placement                       Discharge Plan and Services                          HH Arranged: RN, TPN HH Agency: ([facility]-TPN infusion;[facility]HRN-TPN instruction) Date HH Agency Contacted: 07/21/19 Time [place]: 1122 Representative spoke with at [place]: Pam;Cory  Social Determinants of Health ([place]) Interventions     Readmission Risk Interventions No flowsheet data found.

## 2019-07-21 NOTE — Progress Notes (Signed)
IR consulted by Dr. Pietro Cassis for management of Port-a-cath.  Patient with history of pancreatic cancer. She had a Port-a-cath placed in OR 04/12/2019 by Dr. Barry Dienes. [facility] from SNF due to fever and port-a-cath malfunction. RN stated that on 8/22, unable to access Port-a-cath.  Spoke with RN today who states that floor has been accessing Port-a-cath without issues since 8/23. No further IR interventions needed at this time, will delete order. Dr. Maylene Roes made aware.  IR available in future if needed.   Julie Graff Louk, PA-C 07/21/2019, 10:56 AM

## 2019-07-21 NOTE — Discharge Summary (Signed)
Physician Discharge Summary  Julie Rollins [id number] DOB: 04-07-1946 DOA: 07/14/2019  PCP: Nolene Ebbs, MD  Admit date: 07/14/2019 Discharge date: 07/21/2019  Admitted From: Home Disposition:  Home   Recommendations for Outpatient Follow-up:  1. Follow up with PCP in 1 week 2. Follow up with Dr. [name] as scheduled on 8/31  Home Health: RN   Discharge Condition: Stable CODE STATUS: Full  Diet recommendation: Full liquid diet, TPN   Brief/Interim Summary: Julie Rollins is a 73 year old female with prior history of pancreatic cancer , s/p partialgastric outlet obstruction on oral liquid diet and TPN at home, hypertension, hypothyroidism, anemia of chronic disease to ED for fever. On arrival to ED she was found to have right lower lobe pneumonia,she was started on IV Rocephin and azithromycin.Her blood cultures grew coag neg staph in one set of blood cultures and antibiotics changed to IV vancomycin and IV zosyn.Because of presence of port as well as chemo and radiation with immunosuppression, weekend covering team requested sensitivities. Blood culture resulted with 1 of 2 sets staph epidermidis.  Repeat blood cultures have resulted negative.  Discharge Diagnoses:  Active Problems:   Carcinoma of head of pancreas ([place])   Sepsis due to undetermined organism ([place])   Sepsis secondary to community acquired pneumonia vs aspiration pneumonia -Pt received IV Rocephin and IV Zithromax on 8/20 to 8/21, antibiotics were changed to IV vancomycin (blood cultures were positive for coag neg staph) on 8/22 and IV zosyn added on 8/23 for gram neg coverage and anaerobes -SLP eval revealed moderate aspiration risk -De-escalate to Augmentin on discharge to complete 3 additional days  Staph epidermidis bacteremia -Positive in one set of blood cultures, in both aerobic and anaerobic bottles drawn on 8/21 at 12:31 am.  -In view of her immunocompromised state, with IV port on TPN,  wearetreatingit as true infection. Repeated blood cultures negative to date  -Discussed with Dr. Linus Salmons, infectious disease on-call.  This likely represents contaminant.  Repeat blood cultures have been negative.  Patient afebrile.  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  Continue Cardizem  Hypothyroidism -Continue synthroid  Anemia of chronic disease and malignancy  -Hgb stable     Discharge Instructions  Discharge Instructions    Call MD for:  difficulty breathing, headache or visual disturbances   Complete by: As directed    Call MD for:  extreme fatigue   Complete by: As directed    Call MD for:  hives   Complete by: As directed    Call MD for:  persistant dizziness or light-headedness   Complete by: As directed    Call MD for:  persistant nausea and vomiting   Complete by: As directed    Call MD for:  severe uncontrolled pain   Complete by: As directed    Call MD for:  temperature >100.4   Complete by: As directed    Diet full liquid   Complete by: As directed    Discharge instructions   Complete by: As directed    You were cared for by a hospitalist during your hospital stay.  If you have any questions about your discharge medications or the care you received while you were in the hospital after you are discharged, you can call the unit and ask to speak with the hospitalist on call if the hospitalist that took care of you is not available. Once you are discharged, your primary care physician will handle any further medical issues. Please note that NO  REFILLS for any discharge medications will be authorized once you are discharged, as it is imperative that you return to your primary care physician (or establish a relationship with a primary care physician if you do not have one) for your aftercare needs so that they can reassess your need for medications and monitor your lab values.   Increase activity slowly   Complete by: As directed      Allergies as of 07/21/2019      Reactions   Codeine Nausea Only      Medication List    TAKE these medications   amoxicillin-clavulanate 875-125 MG tablet Commonly known as: AUGMENTIN Take 1 tablet by mouth every 12 (twelve) hours for 3 days.   diltiazem 180 MG 24 hr capsule Commonly known as: CARDIZEM CD Take 180 mg by mouth daily.   hydrocortisone 2.5 % rectal cream Commonly known as: ANUSOL-HC Place rectally 2 (two) times daily.   levothyroxine 50 MCG tablet Commonly known as: SYNTHROID Take 50 mcg by mouth every morning.   metoCLOPramide 5 MG tablet Commonly known as: REGLAN Take 1 tablet (5 mg total) by mouth 4 (four) times daily -  before meals and at bedtime.   mirtazapine 7.5 MG tablet Commonly known as: REMERON Take 1 tablet (7.5 mg total) by mouth at bedtime.   multivitamin with minerals Tabs tablet Take 1 tablet by mouth daily.   olaparib 100 MG tablet Commonly known as: Lynparza Take 1 tablet (100 mg total) by mouth 2 (two) times daily. Swallow whole. May take with food to decrease nausea and vomiting.   ondansetron 8 MG disintegrating tablet Commonly known as: ZOFRAN-ODT Take 1 tablet (8 mg total) by mouth every 8 (eight) hours as needed for nausea or vomiting.   pantoprazole 40 MG tablet Commonly known as: Protonix Take 1 tablet (40 mg total) by mouth 2 (two) times daily.      Follow-up Information    Nolene Ebbs, MD. Schedule an appointment as soon as possible for a visit in 1 week(s).   Specialty: Internal Medicine Contact information: [address] [place] [address] [phone]        Truitt Merle, MD. Go on 07/25/2019.   Specialties: Hematology, Oncology Contact information: [address] [place] [address] [phone]          Allergies  Allergen Reactions  . Codeine Nausea Only    Consultations:  None    Procedures/Studies: Dg Chest 2 View  Result Date: 07/17/2019 CLINICAL DATA:  Weakness, shortness of breath EXAM: CHEST - 2 VIEW COMPARISON:  07/14/2019 FINDINGS: Left Port-A-Cath remains in place, unchanged. Heart and mediastinal contours are within normal limits. No focal opacities or effusions. No acute bony abnormality. Old mild compression fracture in the lower thoracic spine. IMPRESSION: He Electronically Signed   By: Rolm Baptise M.D.   On: 07/17/2019 16:34   Dg Chest Portable 1 View  Result Date: 07/14/2019 CLINICAL DATA:  Fever EXAM: PORTABLE CHEST 1 VIEW COMPARISON:  04/12/2019 FINDINGS: Left-sided central venous port tip over the SVC. No pleural effusion. Stable cardiomediastinal silhouette. Small foci of  airspace disease in the right base. No pneumothorax IMPRESSION: 1. Small patchy foci of airspace disease at the right base are questionable for foci of infection or inflammation 2. The lung fields are otherwise clear Electronically Signed   By: Donavan Foil M.D.   On: 07/14/2019 16:09       Discharge Exam: Vitals:   07/20/19 2105 07/21/19 0520  BP: 126/63 120/63  Pulse: 89 82  Resp: 16 18  Temp: 97.9 F (36.6 C) 98.1 F (36.7 C)  SpO2: 96% 96%     General: Pt is alert, awake, not in acute distress Cardiovascular: RRR, S1/S2 +, no edema Respiratory: CTA bilaterally, no wheezing, no rhonchi, no respiratory distress, no conversational dyspnea  Abdominal: Soft, NT, ND, bowel sounds + Extremities: no edema, no cyanosis Psych: Normal mood and affect, stable judgement and insight     The results of significant diagnostics from this hospitalization (including imaging,  microbiology, ancillary and laboratory) are listed below for reference.     Microbiology: Recent Results (from the past 240 hour(s))  Culture, blood (routine x 2)     Status: None   Collection Time: 07/14/19  2:47 PM   Specimen: BLOOD  Result Value Ref Range Status   Specimen Description   Final    BLOOD RIGHT HAND Performed at [facility] [address] [place] [address], [place], [place] [address]    Special Requests   Final    BOTTLES DRAWN AEROBIC AND ANAEROBIC Blood Culture adequate volume Performed at [facility] [address], [place], [place] [address]    Culture   Final    NO GROWTH 5 DAYS Performed at [facility], [place] [address] [place], [place] [address]    Report Status 07/19/2019 FINAL  Final  SARS Coronavirus 2 [facility] order, Performed in [facility] hospital lab) Nasopharyngeal Nasopharyngeal Swab     Status: None   Collection Time: 07/14/19  3:23 PM   Specimen: Nasopharyngeal Swab  Result Value Ref Range Status   SARS Coronavirus 2 NEGATIVE NEGATIVE Final    Comment: (NOTE) If result is NEGATIVE SARS-CoV-2 target nucleic acids are NOT DETECTED. The SARS-CoV-2 RNA is generally detectable in upper and lower  respiratory specimens during the acute phase of infection. The lowest  concentration of SARS-CoV-2 viral copies this assay can detect is 250  copies / mL. A negative result does not preclude SARS-CoV-2 infection  and should not be used as the sole basis for treatment or other  patient management decisions.  A negative result may occur with  improper specimen collection / handling, submission of specimen other  than nasopharyngeal swab, presence of viral mutation(s) within the  areas targeted by this assay, and inadequate number of viral copies  (<250 copies / mL). A negative result must be combined with clinical  observations, patient history, and epidemiological information. If result is POSITIVE SARS-CoV-2 target nucleic  acids are DETECTED. The SARS-CoV-2 RNA is generally detectable in upper and lower  respiratory specimens dur ing the acute phase of infection.  Positive  results are indicative of active infection with SARS-CoV-2.  Clinical  correlation with patient history and other diagnostic information is  necessary to determine patient infection status.  Positive results do  not rule out bacterial infection or co-infection with other viruses. If result is PRESUMPTIVE POSTIVE SARS-CoV-2 nucleic acids MAY BE PRESENT.   A presumptive positive result was obtained on the submitted specimen  and confirmed on repeat testing.  While 2019 novel  coronavirus  (SARS-CoV-2) nucleic acids may be present in the submitted sample  additional confirmatory testing may be necessary for epidemiological  and / or clinical management purposes  to differentiate between  SARS-CoV-2 and other Sarbecovirus currently known to infect humans.  If clinically indicated additional testing with an alternate test  methodology [phone]) is advised. The SARS-CoV-2 RNA is generally  detectable in upper and lower respiratory sp ecimens during the acute  phase of infection. The expected result is Negative. Fact Sheet for Patients:  StrictlyIdeas.no Fact Sheet for Healthcare Providers: BankingDealers.co.za This test is not yet approved or cleared by the [place] FDA and has been authorized for detection and/or diagnosis of SARS-CoV-2 by FDA under an Emergency Use Authorization (EUA).  This EUA will remain in effect (meaning this test can be used) for the duration of the COVID-19 declaration under Section 564(b)(1) of the Act, 21 U.S.C. section 360bbb-3(b)(1), unless the authorization is terminated or revoked sooner. Performed at [facility], [place] [address] [place], [place] [address]   Urine culture     Status: None   Collection Time: 07/14/19  6:11 PM    Specimen: Urine, Random  Result Value Ref Range Status   Specimen Description   Final    URINE, RANDOM Performed at [facility] [address] [place], [place] [address]    Special Requests   Final    NONE Performed at [facility], [place] [address] [place], [place] [address]    Culture   Final    NO GROWTH Performed at [facility], [place] [address], [place], [place] [address]    Report Status 07/15/2019 FINAL  Final  MRSA PCR Screening     Status: None   Collection Time: 07/14/19  9:23 PM   Specimen: Nasal Mucosa; Nasopharyngeal  Result Value Ref Range Status   MRSA by PCR NEGATIVE NEGATIVE Final    Comment:        The GeneXpert MRSA Assay (FDA approved for NASAL specimens only), is one component of a comprehensive MRSA colonization surveillance program. It is not intended to diagnose MRSA infection nor to guide or monitor treatment for MRSA infections. Performed at [facility], [place] [address] [place], [place] [address]   Culture, blood (routine x 2)     Status: Abnormal   Collection Time: 07/15/19 12:31 AM   Specimen: BLOOD  Result Value Ref Range Status   Specimen Description   Final    BLOOD RIGHT WRIST Performed at [facility] [address], [place], [place] [address]    Special Requests   Final    BOTTLES DRAWN AEROBIC AND ANAEROBIC Blood Culture adequate volume Performed at [facility] [address], [place], [place] [address]    Culture  Setup Time   Final    GRAM POSITIVE COCCI IN BOTH AEROBIC AND ANAEROBIC BOTTLES CRITICAL RESULT CALLED TO, READ BACK BY AND VERIFIED WITH: Gustavo Lah [id number] AT 1420 BY CM Performed at [facility], [place] [address] [place], [place] [address]    Culture STAPHYLOCOCCUS EPIDERMIDIS (A)  Final   Report Status 07/21/2019 FINAL  Final   Organism ID, Bacteria STAPHYLOCOCCUS EPIDERMIDIS  Final       Susceptibility   Staphylococcus epidermidis - MIC*    CIPROFLOXACIN <=0.5 SENSITIVE Sensitive     ERYTHROMYCIN <=0.25 SENSITIVE Sensitive     GENTAMICIN <=0.5 SENSITIVE Sensitive     OXACILLIN >=4  RESISTANT Resistant     TETRACYCLINE <=1 SENSITIVE Sensitive     VANCOMYCIN 1 SENSITIVE Sensitive     TRIMETH/SULFA <=10 SENSITIVE Sensitive     CLINDAMYCIN <=0.25 SENSITIVE Sensitive     RIFAMPIN <=0.5 SENSITIVE Sensitive     Inducible Clindamycin NEGATIVE Sensitive     * STAPHYLOCOCCUS EPIDERMIDIS  Blood Culture ID Panel (Reflexed)     Status: Abnormal   Collection Time: 07/15/19 12:31 AM  Result Value Ref Range Status   Enterococcus species NOT DETECTED NOT DETECTED Final   Listeria monocytogenes NOT DETECTED NOT DETECTED Final   Staphylococcus species DETECTED (A) NOT DETECTED Final    Comment: Methicillin (oxacillin) resistant coagulase negative staphylococcus. Possible blood culture contaminant (unless isolated from more than one blood culture draw or clinical case suggests pathogenicity). No antibiotic treatment is indicated for blood  culture contaminants. CRITICAL RESULT CALLED TO, READ BACK BY AND VERIFIED WITH: PHARMD E JACKSON [id number] AT 1420 BY CM    Staphylococcus aureus (BCID) NOT DETECTED NOT DETECTED Final   Methicillin resistance DETECTED (A) NOT DETECTED Final    Comment: CRITICAL RESULT CALLED TO, READ BACK BY AND VERIFIED WITH: PHARMD E JACKSON [id number] AT 1420 BY CM    Streptococcus species NOT DETECTED NOT DETECTED Final   Streptococcus agalactiae NOT DETECTED NOT DETECTED Final   Streptococcus pneumoniae NOT DETECTED NOT DETECTED Final   Streptococcus pyogenes NOT DETECTED NOT DETECTED Final   Acinetobacter baumannii NOT DETECTED NOT DETECTED Final   Enterobacteriaceae species NOT DETECTED NOT DETECTED Final   Enterobacter cloacae complex NOT DETECTED NOT DETECTED Final   Escherichia coli NOT DETECTED NOT DETECTED Final   Klebsiella oxytoca NOT DETECTED NOT DETECTED  Final   Klebsiella pneumoniae NOT DETECTED NOT DETECTED Final   Proteus species NOT DETECTED NOT DETECTED Final   Serratia marcescens NOT DETECTED NOT DETECTED Final   Haemophilus influenzae NOT DETECTED NOT DETECTED Final   Neisseria meningitidis NOT DETECTED NOT DETECTED Final   Pseudomonas aeruginosa NOT DETECTED NOT DETECTED Final   Candida albicans NOT DETECTED NOT DETECTED Final   Candida glabrata NOT DETECTED NOT DETECTED Final   Candida krusei NOT DETECTED NOT DETECTED Final   Candida parapsilosis NOT DETECTED NOT DETECTED Final   Candida tropicalis NOT DETECTED NOT DETECTED Final    Comment: Performed at [facility], [address] [place], [place] [address]  Culture, blood (routine x 2)     Status: None   Collection Time: 07/16/19  3:03 PM   Specimen: BLOOD  Result Value Ref Range Status   Specimen Description   Final    BLOOD RIGHT ANTECUBITAL Performed at [facility] [address] [place], [place] [address]    Special Requests   Final    BOTTLES DRAWN AEROBIC ONLY Blood Culture adequate volume Performed at [facility] [address], [place], [place] [address]    Culture   Final    NO GROWTH 5 DAYS Performed at [facility], [place] [address], [place], [place] [address]    Report Status 07/21/2019 FINAL  Final  Culture, blood (routine x 2)     Status: None   Collection Time: 07/16/19  3:03 PM   Specimen: BLOOD RIGHT HAND  Result Value Ref Range Status   Specimen Description   Final    BLOOD RIGHT HAND Performed at [facility] [address], [place], [place] [address]    Special Requests   Final  BOTTLES DRAWN AEROBIC ONLY Blood Culture adequate volume Performed at [facility] [address] [place], [place] [address]    Culture   Final    NO GROWTH 5 DAYS Performed at [facility], [place] [address], [place], [place] [address]    Report Status 07/21/2019  FINAL  Final     Labs: BNP (last 3 results) No results for input(s): BNP in the last 8760 hours. Basic Metabolic Panel: Recent Labs  Lab 07/14/19 1544 07/17/19 1640 07/18/19 0406 07/19/19 0438 07/20/19 0520 07/20/19 1105 07/21/19 0508  NA 133* 138 142  --   --  139 138  K 4.7 2.8* 3.7  --   --  3.1* 3.7  CL 103 108 111  --   --  104 103  CO2 22 24 24   --   --  25 25  GLUCOSE 151* 211* 131*  --   --  187* 101*  BUN 16 7* 8  --   --  8 8  CREATININE 0.52 0.45 0.83 0.99 0.87 1.01* 0.86  CALCIUM 7.7* 8.3* 8.8*  --   --  8.3* 8.5*  MG  --   --  2.1  --   --   --  2.1  PHOS  --   --  4.4  --   --   --   --    Liver Function Tests: Recent Labs  Lab 07/14/19 1544 07/17/19 1640 07/18/19 0406  AST 45* 26 27  ALT 20 26 26   ALKPHOS 102 102 102  BILITOT 1.5* 0.4 0.4  PROT 5.4* 5.5* 5.6*  ALBUMIN 2.2* 2.3* 2.4*   Recent Labs  Lab 07/14/19 1544  LIPASE 45   No results for input(s): AMMONIA in the last 168 hours. CBC: Recent Labs  Lab 07/14/19 1506 07/15/19 0031 07/17/19 1640 07/18/19 0406 07/20/19 1105  WBC 5.6 7.5 2.8* 3.8* 5.0  NEUTROABS 4.7  --  1.5* 2.6  --   HGB 10.9* 9.7* 9.1* 9.2* 8.8*  HCT 33.2* 30.4* 28.7* 28.9* 27.3*  MCV 94.6 97.7 95.3 96.7 97.5  PLT 343 201 215 189 254   Cardiac Enzymes: No results for input(s): CKTOTAL, CKMB, CKMBINDEX, TROPONINI in the last 168 hours. BNP: Invalid input(s): POCBNP CBG: No results for input(s): GLUCAP in the last 168 hours. D-Dimer No results for input(s): DDIMER in the last 72 hours. Hgb A1c No results for input(s): HGBA1C in the last 72 hours. Lipid Profile No results for input(s): CHOL, HDL, LDLCALC, TRIG, CHOLHDL, LDLDIRECT in the last 72 hours. Thyroid function studies No results for input(s): TSH, T4TOTAL, T3FREE, THYROIDAB in the last 72 hours.  Invalid input(s): FREET3 Anemia work up No results for input(s): VITAMINB12, FOLATE, FERRITIN, TIBC, IRON, RETICCTPCT in the last 72 hours. Urinalysis     Component Value Date/Time   COLORURINE STRAW (A) 07/14/2019 1811   APPEARANCEUR CLEAR 07/14/2019 1811   LABSPEC 1.009 07/14/2019 1811   PHURINE 8.0 07/14/2019 1811   GLUCOSEU NEGATIVE 07/14/2019 1811   HGBUR NEGATIVE 07/14/2019 1811   BILIRUBINUR NEGATIVE 07/14/2019 1811   KETONESUR NEGATIVE 07/14/2019 1811   PROTEINUR NEGATIVE 07/14/2019 1811   UROBILINOGEN 0.2 06/16/2014 0529   NITRITE NEGATIVE 07/14/2019 1811   LEUKOCYTESUR NEGATIVE 07/14/2019 1811   Sepsis Labs Invalid input(s): PROCALCITONIN,  WBC,  LACTICIDVEN Microbiology Recent Results (from the past 240 hour(s))  Culture, blood (routine x 2)     Status: None   Collection Time: 07/14/19  2:47 PM   Specimen:  BLOOD  Result Value Ref Range Status   Specimen Description   Final    BLOOD RIGHT HAND Performed at [facility] [address] [place], [place] [address]    Special Requests   Final    BOTTLES DRAWN AEROBIC AND ANAEROBIC Blood Culture adequate volume Performed at [facility] [address] [place], [place] [address]    Culture   Final    NO GROWTH 5 DAYS Performed at [facility], [place] [address], [place], [place] [address]    Report Status 07/19/2019 FINAL  Final  SARS Coronavirus 2 [facility] order, Performed in [facility] hospital lab) Nasopharyngeal Nasopharyngeal Swab     Status: None   Collection Time: 07/14/19  3:23 PM   Specimen: Nasopharyngeal Swab  Result Value Ref Range Status   SARS Coronavirus 2 NEGATIVE NEGATIVE Final    Comment: (NOTE) If result is NEGATIVE SARS-CoV-2 target nucleic acids are NOT DETECTED. The SARS-CoV-2 RNA is generally detectable in upper and lower  respiratory specimens during the acute phase of infection. The lowest  concentration of SARS-CoV-2 viral copies this assay can detect is 250  copies / mL. A negative result does not preclude SARS-CoV-2 infection  and should not be used as the sole basis for treatment or other   patient management decisions.  A negative result may occur with  improper specimen collection / handling, submission of specimen other  than nasopharyngeal swab, presence of viral mutation(s) within the  areas targeted by this assay, and inadequate number of viral copies  (<250 copies / mL). A negative result must be combined with clinical  observations, patient history, and epidemiological information. If result is POSITIVE SARS-CoV-2 target nucleic acids are DETECTED. The SARS-CoV-2 RNA is generally detectable in upper and lower  respiratory specimens dur ing the acute phase of infection.  Positive  results are indicative of active infection with SARS-CoV-2.  Clinical  correlation with patient history and other diagnostic information is  necessary to determine patient infection status.  Positive results do  not rule out bacterial infection or co-infection with other viruses. If result is PRESUMPTIVE POSTIVE SARS-CoV-2 nucleic acids MAY BE PRESENT.   A presumptive positive result was obtained on the submitted specimen  and confirmed on repeat testing.  While 2019 novel coronavirus  (SARS-CoV-2) nucleic acids may be present in the submitted sample  additional confirmatory testing may be necessary for epidemiological  and / or clinical management purposes  to differentiate between  SARS-CoV-2 and other Sarbecovirus currently known to infect humans.  If clinically indicated additional testing with an alternate test  methodology [phone]) is advised. The SARS-CoV-2 RNA is generally  detectable in upper and lower respiratory sp ecimens during the acute  phase of infection. The expected result is Negative. Fact Sheet for Patients:  StrictlyIdeas.no Fact Sheet for Healthcare Providers: BankingDealers.co.za This test is not yet approved or cleared by the [place] FDA and has been authorized for detection and/or diagnosis of SARS-CoV-2  by FDA under an Emergency Use Authorization (EUA).  This EUA will remain in effect (meaning this test can be used) for the duration of the COVID-19 declaration under Section 564(b)(1) of the Act, 21 U.S.C. section 360bbb-3(b)(1), unless the authorization is terminated or revoked sooner. Performed at [facility], [place] [address] [place], [place] [address]   Urine culture     Status: None   Collection Time: 07/14/19  6:11 PM   Specimen: Urine, Random  Result  Value Ref Range Status   Specimen Description   Final    URINE, RANDOM Performed at [facility] [address], [place], [place] [address]    Special Requests   Final    NONE Performed at [facility], [place] [address] [place], [place] [address]    Culture   Final    NO GROWTH Performed at [facility], [place] [address] [place], [place] [address]    Report Status 07/15/2019 FINAL  Final  MRSA PCR Screening     Status: None   Collection Time: 07/14/19  9:23 PM   Specimen: Nasal Mucosa; Nasopharyngeal  Result Value Ref Range Status   MRSA by PCR NEGATIVE NEGATIVE Final    Comment:        The GeneXpert MRSA Assay (FDA approved for NASAL specimens only), is one component of a comprehensive MRSA colonization surveillance program. It is not intended to diagnose MRSA infection nor to guide or monitor treatment for MRSA infections. Performed at [facility], [place] [address] [place], [place] [address]   Culture, blood (routine x 2)     Status: Abnormal   Collection Time: 07/15/19 12:31 AM   Specimen: BLOOD  Result Value Ref Range Status   Specimen Description   Final    BLOOD RIGHT WRIST Performed at [facility] [address], [place], [place] [address]    Special Requests   Final    BOTTLES DRAWN AEROBIC AND ANAEROBIC Blood Culture adequate volume Performed at [facility] [address]  [place], [place] [address]    Culture  Setup Time   Final    GRAM POSITIVE COCCI IN BOTH AEROBIC AND ANAEROBIC BOTTLES CRITICAL RESULT CALLED TO, READ BACK BY AND VERIFIED WITH: Gustavo Lah [id number] AT 1420 BY CM Performed at [facility], [place] [address] [place],  [address]    Culture STAPHYLOCOCCUS EPIDERMIDIS (A)  Final   Report Status 07/21/2019 FINAL  Final   Organism ID, Bacteria STAPHYLOCOCCUS EPIDERMIDIS  Final      Susceptibility   Staphylococcus epidermidis - MIC*    CIPROFLOXACIN <=0.5 SENSITIVE Sensitive     ERYTHROMYCIN <=0.25 SENSITIVE Sensitive     GENTAMICIN <=0.5 SENSITIVE Sensitive     OXACILLIN >=4 RESISTANT Resistant     TETRACYCLINE <=1 SENSITIVE Sensitive     VANCOMYCIN 1 SENSITIVE Sensitive     TRIMETH/SULFA <=10 SENSITIVE Sensitive     CLINDAMYCIN <=0.25 SENSITIVE Sensitive     RIFAMPIN <=0.5 SENSITIVE Sensitive     Inducible Clindamycin NEGATIVE Sensitive     * STAPHYLOCOCCUS EPIDERMIDIS  Blood Culture ID Panel (Reflexed)     Status: Abnormal   Collection Time: 07/15/19 12:31 AM  Result Value Ref Range Status   Enterococcus species NOT DETECTED NOT DETECTED Final   Listeria monocytogenes NOT DETECTED NOT DETECTED Final   Staphylococcus species DETECTED (A) NOT DETECTED Final    Comment: Methicillin (oxacillin) resistant coagulase negative staphylococcus. Possible blood culture contaminant (unless isolated from more than one blood culture draw or clinical case suggests pathogenicity). No antibiotic treatment is indicated for blood  culture contaminants. CRITICAL RESULT CALLED TO, READ BACK BY AND VERIFIED WITH: PHARMD E JACKSON [phone] AT 1420 BY CM    Staphylococcus aureus (BCID) NOT DETECTED NOT DETECTED Final   Methicillin resistance DETECTED (A) NOT DETECTED Final    Comment: CRITICAL RESULT CALLED TO, READ BACK BY AND VERIFIED WITH: Mabel [phone]  AT 1420 BY CM    Streptococcus species NOT DETECTED NOT DETECTED Final    Streptococcus agalactiae NOT DETECTED NOT DETECTED Final   Streptococcus pneumoniae NOT DETECTED NOT DETECTED Final   Streptococcus pyogenes NOT DETECTED NOT DETECTED Final   Acinetobacter baumannii NOT DETECTED NOT DETECTED Final   Enterobacteriaceae species NOT DETECTED NOT DETECTED Final   Enterobacter cloacae complex NOT DETECTED NOT DETECTED Final   Escherichia coli NOT DETECTED NOT DETECTED Final   Klebsiella oxytoca NOT DETECTED NOT DETECTED Final   Klebsiella pneumoniae NOT DETECTED NOT DETECTED Final   Proteus species NOT DETECTED NOT DETECTED Final   Serratia marcescens NOT DETECTED NOT DETECTED Final   Haemophilus influenzae NOT DETECTED NOT DETECTED Final   Neisseria meningitidis NOT DETECTED NOT DETECTED Final   Pseudomonas aeruginosa NOT DETECTED NOT DETECTED Final   Candida albicans NOT DETECTED NOT DETECTED Final   Candida glabrata NOT DETECTED NOT DETECTED Final   Candida krusei NOT DETECTED NOT DETECTED Final   Candida parapsilosis NOT DETECTED NOT DETECTED Final   Candida tropicalis NOT DETECTED NOT DETECTED Final    Comment: Performed at [facility], [place] [address] [place], [place] [address]  Culture, blood (routine x 2)     Status: None   Collection Time: 07/16/19  3:03 PM   Specimen: BLOOD  Result Value Ref Range Status   Specimen Description   Final    BLOOD RIGHT ANTECUBITAL Performed at [facility] [address], [place], [place] [address]    Special Requests   Final    BOTTLES DRAWN AEROBIC ONLY Blood Culture adequate volume Performed at [facility] [address], [place], [place] [address]    Culture   Final    NO GROWTH 5 DAYS Performed at [facility], [place] [address], [place], [place] [address]    Report Status 07/21/2019 FINAL  Final  Culture, blood (routine x 2)     Status: None   Collection Time: 07/16/19  3:03 PM   Specimen: BLOOD RIGHT HAND  Result Value Ref Range Status   Specimen  Description   Final    BLOOD RIGHT HAND Performed at [facility] [address], [place], [place] [address]    Special Requests   Final    BOTTLES DRAWN AEROBIC ONLY Blood Culture adequate volume Performed at [facility] [address], [place], [place] [address]    Culture   Final    NO GROWTH 5 DAYS Performed at [facility], [place] [address], [place], [place] [address]    Report Status 07/21/2019 FINAL  Final     Patient was seen and examined on the day of discharge and was found to be in stable condition. Time coordinating discharge: 40 minutes including assessment and coordination of care, as well as examination of the patient.   SIGNED:  Dessa Phi, DO [facility] [email or web address] 07/21/2019, 11:20 AM

## 2019-07-21 NOTE — Care Management Important Message (Signed)
Important Message  Patient Details IM Letter given to Dessa Phi RN to present to the Patient Name: Julie Rollins MRN: [id number] Date of Birth: 05/08/1946   Medicare Important Message Given:  Yes     Kerin Salen 07/21/2019, 11:55 AM

## 2019-07-21 NOTE — Progress Notes (Signed)
Patient discharged to home via Cab, discharge instructions reviewed with patient who verbalized understanding. New RX was sent electronically to pharmacy.

## 2019-07-22 ENCOUNTER — Telehealth: Payer: Self-pay | Admitting: Hematology

## 2019-07-22 NOTE — Telephone Encounter (Signed)
I called pt. She was discharged from hospital yesterday, doing well at home. Home care nurse was at her house, plan to start TPN. I explained to her that due to positive blood culture from 07/15/2019, I would like her to complete oral antibiotics this weekend, and start TNP next Monday 8/31. She is tolerating oral liquid and soft diet well. She agrees with the plan, and I spoke with her home care nurse also. Her f/u appointment with [place] is on 9/10. She will start [place] on 8/31.   Truitt Merle  07/22/2019

## 2019-07-25 ENCOUNTER — Inpatient Hospital Stay: Payer: Medicare HMO

## 2019-07-25 ENCOUNTER — Inpatient Hospital Stay: Payer: Medicare HMO | Admitting: Hematology

## 2019-07-25 ENCOUNTER — Telehealth: Payer: Self-pay

## 2019-07-25 NOTE — Telephone Encounter (Signed)
Home care RN calls wanting to know when the patient should start the Lymparza, I consulted with Cira Rue NP and informed her to hold off on starting it until after her f/u on 9/10, offered earlier appointment however she states patient is doing well and she will let us know if that changes.

## 2019-07-29 ENCOUNTER — Other Ambulatory Visit: Payer: Self-pay | Admitting: General Surgery

## 2019-08-04 ENCOUNTER — Inpatient Hospital Stay: Payer: Medicare HMO | Attending: Nurse Practitioner

## 2019-08-04 ENCOUNTER — Inpatient Hospital Stay: Payer: Medicare HMO | Admitting: Nurse Practitioner

## 2019-08-04 ENCOUNTER — Inpatient Hospital Stay: Payer: Medicare HMO

## 2019-08-04 ENCOUNTER — Other Ambulatory Visit: Payer: Self-pay

## 2019-08-04 ENCOUNTER — Encounter: Payer: Self-pay | Admitting: Nurse Practitioner

## 2019-08-04 VITALS — BP 131/74 | HR 101 | Temp 98.9°F | Resp 18 | Ht 64.0 in | Wt 123.9 lb

## 2019-08-04 DIAGNOSIS — C784 Secondary malignant neoplasm of small intestine: Secondary | ICD-10-CM | POA: Diagnosis present

## 2019-08-04 DIAGNOSIS — Z452 Encounter for adjustment and management of vascular access device: Secondary | ICD-10-CM | POA: Diagnosis not present

## 2019-08-04 DIAGNOSIS — Z1501 Genetic susceptibility to malignant neoplasm of breast: Secondary | ICD-10-CM | POA: Diagnosis not present

## 2019-08-04 DIAGNOSIS — Z8052 Family history of malignant neoplasm of bladder: Secondary | ICD-10-CM | POA: Insufficient documentation

## 2019-08-04 DIAGNOSIS — Z800 Family history of malignant neoplasm of digestive organs: Secondary | ICD-10-CM | POA: Insufficient documentation

## 2019-08-04 DIAGNOSIS — R197 Diarrhea, unspecified: Secondary | ICD-10-CM | POA: Insufficient documentation

## 2019-08-04 DIAGNOSIS — D490 Neoplasm of unspecified behavior of digestive system: Secondary | ICD-10-CM | POA: Diagnosis not present

## 2019-08-04 DIAGNOSIS — E039 Hypothyroidism, unspecified: Secondary | ICD-10-CM | POA: Insufficient documentation

## 2019-08-04 DIAGNOSIS — R748 Abnormal levels of other serum enzymes: Secondary | ICD-10-CM | POA: Diagnosis not present

## 2019-08-04 DIAGNOSIS — C250 Malignant neoplasm of head of pancreas: Secondary | ICD-10-CM

## 2019-08-04 DIAGNOSIS — Z86000 Personal history of in-situ neoplasm of breast: Secondary | ICD-10-CM | POA: Diagnosis not present

## 2019-08-04 DIAGNOSIS — Z923 Personal history of irradiation: Secondary | ICD-10-CM | POA: Diagnosis not present

## 2019-08-04 DIAGNOSIS — Z95828 Presence of other vascular implants and grafts: Secondary | ICD-10-CM

## 2019-08-04 DIAGNOSIS — K311 Adult hypertrophic pyloric stenosis: Secondary | ICD-10-CM | POA: Insufficient documentation

## 2019-08-04 DIAGNOSIS — K5730 Diverticulosis of large intestine without perforation or abscess without bleeding: Secondary | ICD-10-CM | POA: Diagnosis not present

## 2019-08-04 DIAGNOSIS — Z853 Personal history of malignant neoplasm of breast: Secondary | ICD-10-CM | POA: Insufficient documentation

## 2019-08-04 DIAGNOSIS — R112 Nausea with vomiting, unspecified: Secondary | ICD-10-CM | POA: Diagnosis not present

## 2019-08-04 LAB — CBC WITH DIFFERENTIAL (CANCER CENTER ONLY)
Abs Immature Granulocytes: 0.03 10*3/uL (ref 0.00–0.07)
Basophils Absolute: 0 10*3/uL (ref 0.0–0.1)
Basophils Relative: 1 %
Eosinophils Absolute: 0.3 10*3/uL (ref 0.0–0.5)
Eosinophils Relative: 4 %
HCT: 31.1 % — ABNORMAL LOW (ref 36.0–46.0)
Hemoglobin: 10.1 g/dL — ABNORMAL LOW (ref 12.0–15.0)
Immature Granulocytes: 1 %
Lymphocytes Relative: 17 %
Lymphs Abs: 1 10*3/uL (ref 0.7–4.0)
MCH: 31.4 pg (ref 26.0–34.0)
MCHC: 32.5 g/dL (ref 30.0–36.0)
MCV: 96.6 fL (ref 80.0–100.0)
Monocytes Absolute: 0.5 10*3/uL (ref 0.1–1.0)
Monocytes Relative: 9 %
Neutro Abs: 3.9 10*3/uL (ref 1.7–7.7)
Neutrophils Relative %: 68 %
Platelet Count: 308 10*3/uL (ref 150–400)
RBC: 3.22 MIL/uL — ABNORMAL LOW (ref 3.87–5.11)
RDW: 14.1 % (ref 11.5–15.5)
WBC Count: 5.7 10*3/uL (ref 4.0–10.5)
nRBC: 0 % (ref 0.0–0.2)

## 2019-08-04 LAB — CMP (CANCER CENTER ONLY)
ALT: 22 U/L (ref 0–44)
AST: 20 U/L (ref 15–41)
Albumin: 3.5 g/dL (ref 3.5–5.0)
Alkaline Phosphatase: 127 U/L — ABNORMAL HIGH (ref 38–126)
Anion gap: 10 (ref 5–15)
BUN: 34 mg/dL — ABNORMAL HIGH (ref 8–23)
CO2: 30 mmol/L (ref 22–32)
Calcium: 10 mg/dL (ref 8.9–10.3)
Chloride: 100 mmol/L (ref 98–111)
Creatinine: 0.8 mg/dL (ref 0.44–1.00)
GFR, Est AFR Am: >60 mL/min (ref >60)
GFR, Estimated: >60 mL/min (ref >60)
Glucose, Bld: 144 mg/dL — ABNORMAL HIGH (ref 70–99)
Potassium: 4.1 mmol/L (ref 3.5–5.1)
Sodium: 140 mmol/L (ref 135–145)
Total Bilirubin: 0.3 mg/dL (ref 0.3–1.2)
Total Protein: 7.2 g/dL (ref 6.5–8.1)

## 2019-08-04 MED ORDER — HEPARIN SOD (PORK) LOCK FLUSH 100 UNIT/ML IV SOLN
500.0000 [IU] | Freq: Once | INTRAVENOUS | Status: AC | PRN
  Administered 2019-08-04: 12:00:00 500 [IU]
  Filled 2019-08-04: qty 5

## 2019-08-04 MED ORDER — SODIUM CHLORIDE 0.9% FLUSH
10.0000 mL | INTRAVENOUS | Status: DC | PRN
  Administered 2019-08-04: 12:00:00 10 mL
  Filled 2019-08-04: qty 10

## 2019-08-04 NOTE — Progress Notes (Signed)
Julie Rollins   [phone] [phone]   Clinic Follow up Note   Patient Care Team: Nolene Ebbs, MD as PCP - General (Internal Medicine) Stark Klein, MD as Consulting Physician (General Surgery) Truitt Merle, MD as Consulting Physician (Hematology) Alla Feeling, NP as Nurse Practitioner (Nurse Practitioner) Arna Snipe, RN as Oncology Nurse Navigator 08/04/2019  CHIEF COMPLAINT: Follow-up pancreas cancer  SUMMARY OF ONCOLOGIC HISTORY: Oncology History  Carcinoma of head of pancreas ([place])  03/28/2019 Imaging   CT AP w contrast IMPRESSION: Interval development of 20 x 14 mm ill-defined low density in pancreatic head concerning for possible neoplasm or malignancy. This appears to be resulting in severe intrahepatic and extrahepatic biliary dilatation. ERCP is recommended for further evaluation.  Sigmoid diverticulosis without inflammation.   03/29/2019 Initial Biopsy   Diagnosis FINE NEEDLE ASPIRATION, ENDOSCOPIC EGUS, PANCREATIC HEAD MASS (SPECIMEN 1 OF 2 COLLECTED 03/29/2019) MALIGNANT CELLS CONSISTENT WITH ADENOCARCINOMA.   03/29/2019 Initial Biopsy   Diagnosis Duodenum, Biopsy, abnormal mucosa at major papilla - ADENOCARCINOMA. SEE NOTE Diagnosis Note The duodenal mucosa is negative for dysplasia. The findings are consistent with duodenal involvement from patient's suspected primary pancreatic adenocarcinoma. Dr. Melina Copa has reviewed this case and concurs with the above interpretation. Dr. Ardis Hughs was notified on 03/30/2019.   03/29/2019 Pathology Results   Diagnosis BILE DUCT BRUSHING (SPECIMEN 2 OF 2, COLLECTED ON 03/29/2019): HIGHLY SUSPICIOUS FOR MALIGNANCY.   03/29/2019 Procedure   ERCP with stenting of malignant distal CBD stricture per Dr. Ardis Hughs   04/04/2019 Initial Diagnosis   Malignant neoplasm of head of pancreas ([place])   04/08/2019 Imaging   CT Chest IMPRESSION: No evidence of metastatic disease in the chest. Aortic Atherosclerosis  (ICD10-I70.0).    04/13/2019 - 04/27/2019 Chemotherapy   neoadjuvant FOLFIRINOX q3weeks beginning 04/13/19; dose-reduced with cycle 1. Stopped afger 2 cycles due to poor toleration   05/20/2019 Imaging   CT AP W Contrast 05/20/19  05/20/19  IMPRESSION: 1. Mild increase in size of the infiltrative pancreatic head malignancy currently 1.9 by 1.6 cm, previously 1.7 by 1.2 cm by my measurements. No direct major vascular involvement or distant metastatic spread identified. The margin of the tumor is traversed by metallic stent, with resulting pneumobilia. Mildly increased dorsal pancreatic duct dilatation. 2. Diffuse hepatic steatosis. 3. Small hemangioma inferiorly in the right hepatic lobe, stable from 2007. 4. Mild wall thickening of the gallbladder, nonspecific. 5. 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 6. Other imaging findings of potential clinical significance: Sigmoid colon diverticulosis. Degenerative hip arthropathy bilaterally. Old healed sternal fracture. Stable wedge compression fracture at T12. Grade 1 anterolisthesis at L4-5.   06/08/2019 Genetic Testing   Positive genetic testing. 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 The Common Hereditary Cancers Panel offered by [employer] includes sequencing and/or deletion duplication testing of the following 48 genes: APC, ATM, AXIN2, BARD1, BMPR1A, BRCA1, BRCA2, BRIP1, CDH1, CDKN2A (p14ARF), CDKN2A (p16INK4a), CKD4, CHEK2, CTNNA1, DICER1, EPCAM (Deletion/duplication testing only), GREM1 (promoter region deletion/duplication testing only), KIT, MEN1, MLH1, MSH2, MSH3, MSH6, MUTYH, NBN, NF1, NHTL1, PALB2, PDGFRA, PMS2, POLD1, POLE, PTEN, RAD50, RAD51C, RAD51D, RNF43, SDHB, SDHC, SDHD, SMAD4, SMARCA4. STK11, TP53, TSC1, TSC2, and VHL.  The following genes were evaluated for sequence changes only: SDHA and HOXB13 c.251G>A variant  only. The report date is 06/08/2019.   06/09/2019 Procedure   EGD by Dr. Ardis Hughs 06/09/19  IMPRESSION - 4 fiducial markers were placed into the pancreatic head mass  via transduodenal EUS approach. - Duodenal edema and luminal narrowing in the distal duodenal bulb, likely from adjacent pancreatic mass. This is causing partial gastric outlet obstruction.   06/17/2019 Procedure   Upper Endoscopy by Dr. Carlean Purl 06/17/19  IMRPESSION - Acquired duodenal stenosis. SUSPECT FROM TUMOR AND MASS EFFECT - PARTIAL AND TIGHT - BILIARY STENT COULD BE CONTRIBUTING OR CAUSING THIS BY IRRITATING DUODENAL WALL BUT AT THIS POINT I DO NOT THINK SO - Slight fibrous food (residue) in the stomach. LARGELY CLEAR OF FLUID AND DEBRIS - The examination was otherwise normal. - No specimens collected.   06/28/2019 -  Radiation Therapy   SBRT 06/28/19-07/08/19     CURRENT THERAPY: PENDING PARP-inhibitor Olaparib [place]) 100 mg BID, starting 08/05/19  INTERVAL HISTORY: Julie Rollins returns for follow-up as scheduled. She continues TPN x16 hours daily. She does this herself without difficulty. Home health nurse comes weekly on wednesdays to change needle. She is eating soft foods and supplements and water by mouth. Foods still don't taste right. Denies n/v/d. Has 2-3 BM daily. No bleeding. Denies recent fever, chills, cough, chest pain. She completed antibiotics. Has occasional DOE. Denies abdominal pain. She is getting stronger daily, active at home. She is able to run errands and drive short distances. She has not started lynparza yet.    MEDICAL HISTORY:  Past Medical History:  Diagnosis Date   Anxiety    Blood transfusion without reported diagnosis    Breast CA ([place])    s/p lumpectomy and radiation 2000   Breast cancer ([place])    Colitis    2007   Dyspnea    Dysrhythmia    Family history of bladder cancer    Family history of stomach cancer    Family history of throat cancer    Hernia, epigastric     HTN (hypertension)    "mild hypertension", was on BP med years ago but it caused orthostatic hypotension and she has not been medicated since   Hypothyroid    Pancreatic cancer (Julie Rollins) dx'd 03/2019   Personal history of breast cancer    Personal history of radiation therapy 2001   Poor appetite 04/2019   Vertigo    Vertigo     SURGICAL HISTORY: Past Surgical History:  Procedure Laterality Date   ABDOMINAL HYSTERECTOMY     60yr ago   ANTERIOR CERVICAL DECOMP/DISCECTOMY FUSION N/A 08/02/2018   Procedure: ANTERIOR CERVICAL DECOMPRESSION/DISCECTOMY FUSION CERVICAL FIVE- CERVICAL SIX;  Surgeon: PEarnie Larsson MD;  [facility];  Service: Neurosurgery;  Laterality: N/A;  ANTERIOR CERVICAL DECOMPRESSION/DISCECTOMY FUSION CERVICAL FIVE- CERVICAL SIX   BILIARY BRUSHING  03/29/2019   Procedure: BILIARY BRUSHING;  Surgeon: JMilus Banister MD;  [facility];  Service: Endoscopy;;   BILIARY STENT PLACEMENT  03/29/2019   Procedure: BILIARY STENT PLACEMENT;  Surgeon: JMilus Banister MD;  [facility];  Service: Endoscopy;;   BIOPSY  03/29/2019   Procedure: BIOPSY;  Surgeon: JMilus Banister MD;  [facility];  Service: Endoscopy;;   BREAST LUMPECTOMY     2000   ERCP N/A 03/29/2019   Procedure: ENDOSCOPIC RETROGRADE CHOLANGIOPANCREATOGRAPHY (ERCP);  Surgeon: JMilus Banister MD;  [facility];  Service: Endoscopy;  Laterality: N/A;   ESOPHAGOGASTRODUODENOSCOPY N/A 06/17/2019   Procedure: ESOPHAGOGASTRODUODENOSCOPY (EGD);  Surgeon: GGatha Mayer MD;  [facility];  Service: Gastroenterology;  Laterality: N/A;   ESOPHAGOGASTRODUODENOSCOPY (EGD) WITH PROPOFOL N/A 03/29/2019   Procedure: ESOPHAGOGASTRODUODENOSCOPY (EGD) WITH PROPOFOL;  Surgeon: JMilus Banister MD;  [facility];  Service: Endoscopy;  Laterality: N/A;   ESOPHAGOGASTRODUODENOSCOPY (EGD) WITH PROPOFOL N/A 06/09/2019   Procedure: ESOPHAGOGASTRODUODENOSCOPY (EGD) WITH  PROPOFOL;  Surgeon: [name], MD;  [facility];  Service: Endoscopy;  Laterality: N/A;   EUS N/A 03/29/2019   Procedure: UPPER ENDOSCOPIC ULTRASOUND (EUS) LINEAR;  Surgeon: [name], MD;  [facility];  Service: Endoscopy;  Laterality: N/A;   EUS N/A 06/09/2019   Procedure: UPPER ENDOSCOPIC ULTRASOUND (EUS) LINEAR;  Surgeon: [name], MD;  [facility];  Service: Endoscopy;  Laterality: N/A;   FIDUCIAL MARKER PLACEMENT  06/09/2019   Procedure: FIDUCIAL MARKER PLACEMENT;  Surgeon: [name], MD;  [facility];  Service: Endoscopy;;   FINE NEEDLE ASPIRATION  03/29/2019   Procedure: FINE NEEDLE ASPIRATION (FNA);  Surgeon: [name], MD;  Location[facility];  Service: Endoscopy;;   HERNIA REPAIR     20+ years ago   PORTACATH PLACEMENT N/A 04/12/2019   Procedure: INSERTION PORT-A-CATH LEFT SUBCLAVIAN;  Surgeon: Stark Klein, MD;  Location: [facility];  Service: General;  Laterality: N/A;   RETINAL DETACHMENT SURGERY     SPHINCTEROTOMY  03/29/2019   Procedure: SPHINCTEROTOMY;  Surgeon: [name], MD;  [facility];  Service: Endoscopy;;    I have reviewed the social history and family history with the patient and they are unchanged from previous note.  ALLERGIES:  is allergic to codeine.  MEDICATIONS:  Current Outpatient Medications  Medication Sig Dispense Refill   diltiazem (CARDIZEM CD) 180 MG 24 hr capsule Take 180 mg by mouth daily.     hydrocortisone (ANUSOL-HC) 2.5 % rectal cream Place rectally 2 (two) times daily.     levothyroxine (SYNTHROID, LEVOTHROID) 50 MCG tablet Take 50 mcg by mouth every morning.     mirtazapine (REMERON) 7.5 MG tablet Take 1 tablet (7.5 mg total) by mouth at bedtime. 30 tablet 1   Multiple Vitamin (MULTIVITAMIN WITH MINERALS) TABS tablet Take 1 tablet by mouth daily.     pantoprazole (PROTONIX) 40 MG tablet Take 1 tablet (40 mg total) by  mouth 2 (two) times daily.     metoCLOPramide (REGLAN) 5 MG tablet Take 1 tablet (5 mg total) by mouth 4 (four) times daily -  before meals and at bedtime. (Patient not taking: Reported on 08/04/2019)     olaparib (LYNPARZA) 100 MG tablet Take 1 tablet (100 mg total) by mouth 2 (two) times daily. Swallow whole. May take with food to decrease nausea and vomiting. (Patient not taking: Reported on 07/14/2019) 60 tablet 0   ondansetron (ZOFRAN-ODT) 8 MG disintegrating tablet Take 1 tablet (8 mg total) by mouth every 8 (eight) hours as needed for nausea or vomiting. (Patient not taking: Reported on 07/14/2019) 20 tablet 0   No current facility-administered medications for this visit.     PHYSICAL EXAMINATION: ECOG PERFORMANCE STATUS: 1-2  Vitals:   08/04/19 1150  BP: 131/74  Pulse: (!) 101  Resp: 18  Temp: 98.9 F (37.2 C)  SpO2: 100%   Filed Weights   08/04/19 1150  Weight: 123 lb 14.4 oz (56.2 kg)    GENERAL:alert, no distress and comfortable SKIN: no rash  EYES:  sclera clear LYMPH:  Soft tissue prominence in left supraclavicular area, no discrete mass LUNGS: clear to auscultation with normal breathing effort CHEST: no venous engorgement  HEART: regular rate & rhythm no lower extremity edema ABDOMEN:abdomen soft, non-tender and normal bowel sounds Musculoskeletal:no cyanosis of  digits and no clubbing  NEURO: alert & oriented x 3 with fluent speech, no focal motor/sensory deficits  LABORATORY DATA:  I have reviewed the data as listed CBC Latest Ref Rng & Units 08/04/2019 07/20/2019 07/18/2019  WBC 4.0 - 10.5 K/uL 5.7 5.0 3.8(L)  Hemoglobin 12.0 - 15.0 g/dL 10.1(L) 8.8(L) 9.2(L)  Hematocrit 36.0 - 46.0 % 31.1(L) 27.3(L) 28.9(L)  Platelets 150 - 400 K/uL 308 254 189     CMP Latest Ref Rng & Units 08/04/2019 07/21/2019 07/20/2019  Glucose 70 - 99 mg/dL 144(H) 101(H) 187(H)  BUN 8 - 23 mg/dL 34(H) 8 8  Creatinine 0.44 - 1.00 mg/dL 0.80 0.86 1.01(H)  Sodium 135 - 145 mmol/L 140  138 139  Potassium 3.5 - 5.1 mmol/L 4.1 3.7 3.1(L)  Chloride 98 - 111 mmol/L 100 103 104  CO2 22 - 32 mmol/L _0 Calcium 8.9 - 10.3 mg/dL 10.0 8.5(L) 8.3(L)  Total Protein 6.5 - 8.1 g/dL 7.2 - -  Total Bilirubin 0.3 - 1.2 mg/dL 0.3 - -  Alkaline Phos 38 - 126 U/L 127(H) - -  AST 15 - 41 U/L 20 - -  ALT 0 - 44 U/L 22 - -      RADIOGRAPHIC STUDIES: I have personally reviewed the radiological images as listed and agreed with the findings in the report. No results found.   ASSESSMENT & PLAN: Julie Rollins is a 73 y.o. female with   1. Adenocarcinoma of pancreatic head, cT2N0M0, with duodenal involvement  -Diagnosed 03/2019, presented with painless juandice s/p ERCP with stenting (bili 23.9).  -She started neoadjuvant FOLFIRINOX on 04/13/19. S/p 2 cycles. Chemo held due to persistent nausea and poor appetite  -she unfortunately developed duodenal obstruction due to the pancreatic cancer, was hospitalized, and started TPN in the hospital.  -She completed SBRT on 06/28/19 - 07/08/19.  -genetic test positive for BRCA2 mutation. Given her limited treatment options Dr. [name] recommend starting oral PARP2 inhibitor Olaparib, she will start 08/05/19    2. Partial Gastric Outlet Obstruction  -Due to worsening N&V she was hospitalized on 06/13/19. She had partial gastric outlet obstruction otherwise upper GI workup negative. She had TPN placed, on liquid diet.  -currently living at home -on TPN, tolerating soft diet but she still complaints of decreased taste so she is not eating much by mouth  3. 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 Ardis Hughs on 03/29/19 -Her jaundice has resolved  4.Persistent nausea, vomiting, diarrhea, and anorexia -She had these symptomsbefore chemo, got much worse after chemo -Since recent hospitalization for gastric outlet obstruction her symptoms are now  manageable. Will monitor as this can indicate another obstruction.   5.BRCA2 + mutation -Has personal history of breast cancer (DCIS) and pancreas cancer. 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  -She is eligible for PARP2 inhibitor Olaparib which she will start tomorrow.   6. H/o left breast DCIS -diagnosed in 2000, s/p lumpectomy and adjuvant radiation, managed by Dr. Margot Chimes -last mammo in 01/2018 negative for evidence of malignancy   7. Hypothyroidism -on Synthroid per PCP  8.Transaminitis -Her initial transaminitis and jaundice most resolved after stent placement -Persistent moderate transaminitis after chemo, total bilirubin normal, will continue monitoring -No liver metastasis on recent scan -alk phos remains mildly elevated, 127; AST/ALT normal  9. Limited social support  -She lives alone, has very limited social support -uses [employer] transportation service  Disposition:  Ms. Wormley appears stable. Her performance status is improving since hospital discharge. She is living at home, with home health, and continues TPN. She is eating minimally by mouth, tolerating soft diet, drinking adequately. I encouraged her to increase po nutrition as tolerated. Labs reviewed, CBC and CMP adequate to begin PARP inhibitor Olaparib. She will start 08/05/19. Will obtain CT CAP to restage as she begins new therapy. She will f/u with Dr. Barry Dienes next week. She will have phone f/u in 1 week, labs and in-person f/u in 2 weeks. She will also follow closely with dietician. I reviewed the plan with Dr. [name].   Orders Placed This Encounter  Procedures   CT Abdomen Pelvis W Contrast    Standing Status:   Future    Standing Expiration Date:   08/03/2020    Order Specific Question:   If indicated for the ordered procedure, I authorize the administration of contrast media  per Radiology protocol    Answer:   Yes    Order Specific Question:   Preferred imaging location?    Answer:   [facility]    Order Specific Question:   Is Oral Contrast requested for this exam?    Answer:   Yes, Per Radiology protocol    Order Specific Question:   Radiology Contrast Protocol - do NOT remove file path    Answer:   \[email or web address]   CT Chest W Contrast    Standing Status:   Future    Standing Expiration Date:   08/03/2020    Order Specific Question:   ** REASON FOR EXAM (FREE TEXT)    Answer:   baseline for new treatment, evaluate left supraclav soft tissue prominence    Order Specific Question:   If indicated for the ordered procedure, I authorize the administration of contrast media per Radiology protocol    Answer:   Yes    Order Specific Question:   Preferred imaging location?    Answer:   [facility]    Order Specific Question:   Radiology Contrast Protocol - do NOT remove file path    Answer:   \[email or web address]   All questions were answered. The patient knows to call the clinic with any problems, questions or concerns. No barriers to learning was detected. I spent 20 minutes counseling the patient face to face. The total time spent in the appointment was 25 minutes and more than 50% was on counseling and review of test results     Alla Feeling, NP 08/04/19

## 2019-08-05 ENCOUNTER — Telehealth: Payer: Self-pay | Admitting: Pharmacist

## 2019-08-05 ENCOUNTER — Telehealth: Payer: Self-pay | Admitting: Nurse Practitioner

## 2019-08-05 LAB — CANCER ANTIGEN 19-9: CA 19-9: 622 U/mL — ABNORMAL HIGH (ref 0–35)

## 2019-08-05 NOTE — Telephone Encounter (Signed)
Scheduled appt per 9/10 los.  Patient did not want to schedule a nutrition appt.  Spoke with patient and she is aware of her appt date and time

## 2019-08-05 NOTE — Telephone Encounter (Signed)
Oral Oncology Pharmacist Encounter  Received notification from the [facility] outpatient pharmacy that patient called them this morning with some additional questions about Lynparza initiation for early stage, BRCA+ pancreatic cancer, in a patient who is not a good candidate for surgical resection at this time due to performance status, and who is not able to tolerate IV chemotherapy at this time either.  I spoke with patient nad addressed her concerns related to possible (15-20%) incidence of decreased appetite and possible decreased blood cell counts (~20%).  Patient instructed to alert the office with any changes in weight and she was also informed we would be keeping a close follow-up schedule with her to ensure toleration and benefit. Patient also informed that her dose has been appropriately decreased for best chance of toleration.  She did complain of some green nasal discharge this morning, but denies any other issues. She will alert the office of increases in production, or any symptoms of fevers or chills.  All questions answered. Patient is agreeable to starting her [place] today. She knows to call the office with any additional questions or concerns.  Johny Drilling, PharmD, BCPS, BCOP  08/05/2019 10:31 AM Oral Oncology Clinic [phone]

## 2019-08-08 ENCOUNTER — Telehealth: Payer: Self-pay | Admitting: *Deleted

## 2019-08-08 ENCOUNTER — Telehealth: Payer: Self-pay

## 2019-08-08 ENCOUNTER — Other Ambulatory Visit: Payer: Self-pay | Admitting: General Surgery

## 2019-08-08 NOTE — Telephone Encounter (Signed)
Faxed signed orders for [facility] on 08/04/2019 to fax [phone]  Sent to HIM for scan to chart.

## 2019-08-08 NOTE — Telephone Encounter (Signed)
Per Cira Rue, NP, called pt with date and time of scheduled CT scan on 9/21 @830  am. Advised pt to pick up contrast this week. On day of CT pt is to drink contrast @ 630 am and @ 730 am. Nothing to eat or drink 4 hours prior. Pt verbalized understanding.

## 2019-08-08 NOTE — Telephone Encounter (Signed)
-----   Message from [facility] sent at 08/08/2019  8:18 AM EDT ----- Good morning,   Scans have been approved and authorized in Epic.   Authorization [id number] Valid Dates: 08/09/2019-09/08/2019 Location: [facility]  Thanks, Pashon  ----- Message ----- From: Alla Feeling, NP Sent: 08/07/2019   3:10 PM EDT To: Nilda Calamity, [facility]  Pashon,  She has medicare, does CT need pre-auth? If not, could my nurse please go ahead and schedule in the next 1-2 weeks please, before next in person office visit. Thanks, Regan Rakers

## 2019-08-09 ENCOUNTER — Telehealth: Payer: Self-pay | Admitting: Nurse Practitioner

## 2019-08-09 NOTE — Telephone Encounter (Signed)
Left message for patient to verify visit for pre reg

## 2019-08-10 ENCOUNTER — Inpatient Hospital Stay (HOSPITAL_BASED_OUTPATIENT_CLINIC_OR_DEPARTMENT_OTHER): Payer: Medicare HMO | Admitting: Nurse Practitioner

## 2019-08-10 ENCOUNTER — Other Ambulatory Visit: Payer: Self-pay | Admitting: Nurse Practitioner

## 2019-08-10 ENCOUNTER — Encounter: Payer: Self-pay | Admitting: Nurse Practitioner

## 2019-08-10 DIAGNOSIS — C250 Malignant neoplasm of head of pancreas: Secondary | ICD-10-CM | POA: Diagnosis not present

## 2019-08-10 MED ORDER — MIRTAZAPINE 7.5 MG PO TABS: 7.5000 mg | ORAL_TABLET | Freq: Every day | ORAL | 3 refills | Status: DC

## 2019-08-10 NOTE — Progress Notes (Signed)
[name]   [phone] Fax:([phone]) [phone]   Clinic Follow up Note   Patient Care Team: Nolene Ebbs, MD as PCP - General (Internal Medicine) Stark Klein, MD as Consulting Physician (General Surgery) Truitt Merle, MD as Consulting Physician (Hematology) Alla Feeling, NP as Nurse Practitioner (Nurse Practitioner) Arna Snipe, RN as Oncology Nurse Navigator 08/10/2019  I connected with Julie Rollins on 08/10/19 at 3:23 PM EDT by telephone visit and verified that I am speaking with the correct person using two identifiers.   I discussed the limitations, risks, security and privacy concerns of performing an evaluation and management service by telemedicine and the availability of in-person appointments. I also discussed with the patient that there may be a patient responsible charge related to this service. The patient expressed understanding and agreed to proceed.   Other persons participating in the visit and their role in the encounter: None    Patient's location: Home Provider's location: [place] office  Chief Complaint: f/u pancreas cancer    SUMMARY OF ONCOLOGIC HISTORY: Oncology History  Carcinoma of head of pancreas ([place])  03/28/2019 Imaging   CT AP w contrast IMPRESSION: Interval development of 20 x 14 mm ill-defined low density in pancreatic head concerning for possible neoplasm or malignancy. This appears to be resulting in severe intrahepatic and extrahepatic biliary dilatation. ERCP is recommended for further evaluation.  Sigmoid diverticulosis without inflammation.   03/29/2019 Initial Biopsy   Diagnosis FINE NEEDLE ASPIRATION, ENDOSCOPIC EGUS, PANCREATIC HEAD MASS (SPECIMEN 1 OF 2 COLLECTED 03/29/2019) MALIGNANT CELLS CONSISTENT WITH ADENOCARCINOMA.   03/29/2019 Initial Biopsy   Diagnosis Duodenum, Biopsy, abnormal mucosa at major papilla - ADENOCARCINOMA. SEE NOTE Diagnosis Note The duodenal mucosa is negative for dysplasia. The  findings are consistent with duodenal involvement from patient's suspected primary pancreatic adenocarcinoma. Dr. Melina Copa has reviewed this case and concurs with the above interpretation. Dr. Ardis Hughs was notified on 03/30/2019.   03/29/2019 Pathology Results   Diagnosis BILE DUCT BRUSHING (SPECIMEN 2 OF 2, COLLECTED ON 03/29/2019): HIGHLY SUSPICIOUS FOR MALIGNANCY.   03/29/2019 Procedure   ERCP with stenting of malignant distal CBD stricture per Dr. Ardis Hughs   04/04/2019 Initial Diagnosis   Malignant neoplasm of head of pancreas ([place])   04/08/2019 Imaging   CT Chest IMPRESSION: No evidence of metastatic disease in the chest. Aortic Atherosclerosis (ICD10-I70.0).    04/13/2019 - 04/27/2019 Chemotherapy   neoadjuvant FOLFIRINOX q3weeks beginning 04/13/19; dose-reduced with cycle 1. Stopped afger 2 cycles due to poor toleration   05/20/2019 Imaging   CT AP W Contrast 05/20/19  05/20/19  IMPRESSION: 1. Mild increase in size of the infiltrative pancreatic head malignancy currently 1.9 by 1.6 cm, previously 1.7 by 1.2 cm by my measurements. No direct major vascular involvement or distant metastatic spread identified. The margin of the tumor is traversed by metallic stent, with resulting pneumobilia. Mildly increased dorsal pancreatic duct dilatation. 2. Diffuse hepatic steatosis. 3. Small hemangioma inferiorly in the right hepatic lobe, stable from 2007. 4. Mild wall thickening of the gallbladder, nonspecific. 5. 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 6. Other imaging findings of potential clinical significance: Sigmoid colon diverticulosis. Degenerative hip arthropathy bilaterally. Old healed sternal fracture. Stable wedge compression fracture at T12. Grade 1 anterolisthesis at L4-5.   06/08/2019 Genetic Testing   Positive genetic testing. 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  The Common Hereditary Cancers Panel offered by [employer] includes sequencing and/or deletion duplication testing  of the following 48 genes: APC, ATM, AXIN2, BARD1, BMPR1A, BRCA1, BRCA2, BRIP1, CDH1, CDKN2A (p14ARF), CDKN2A (p16INK4a), CKD4, CHEK2, CTNNA1, DICER1, EPCAM (Deletion/duplication testing only), GREM1 (promoter region deletion/duplication testing only), KIT, MEN1, MLH1, MSH2, MSH3, MSH6, MUTYH, NBN, NF1, NHTL1, PALB2, PDGFRA, PMS2, POLD1, POLE, PTEN, RAD50, RAD51C, RAD51D, RNF43, SDHB, SDHC, SDHD, SMAD4, SMARCA4. STK11, TP53, TSC1, TSC2, and VHL.  The following genes were evaluated for sequence changes only: SDHA and HOXB13 c.251G>A variant only. The report date is 06/08/2019.   06/09/2019 Procedure   EGD by Dr. Ardis Hughs 06/09/19  IMPRESSION - 4 fiducial markers were placed into the pancreatic head mass via transduodenal EUS approach. - Duodenal edema and luminal narrowing in the distal duodenal bulb, likely from adjacent pancreatic mass. This is causing partial gastric outlet obstruction.   06/17/2019 Procedure   Upper Endoscopy by Dr. Carlean Purl 06/17/19  IMRPESSION - Acquired duodenal stenosis. SUSPECT FROM TUMOR AND MASS EFFECT - PARTIAL AND TIGHT - BILIARY STENT COULD BE CONTRIBUTING OR CAUSING THIS BY IRRITATING DUODENAL WALL BUT AT THIS POINT I DO NOT THINK SO - Slight fibrous food (residue) in the stomach. LARGELY CLEAR OF FLUID AND DEBRIS - The examination was otherwise normal. - No specimens collected.   06/28/2019 - 07/08/2019 Radiation Therapy   SBRT 06/28/19-07/08/19   08/05/2019 -  Chemotherapy   Began PARP inhibitor, Olaparib 100 mg BID on 08/05/2019       CURRENT THERAPY: Olaparib 100 mg BID, starting 08/05/19   INTERVAL HISTORY: Julie Rollins presents by phone for scheduled f/u. She identifies herself by name and DOB. She is doing "OK." she plans to pick up CT contrast tomorrow for scan on 9/21. She saw Dr. Barry Dienes recently who plans to do surgery, next f/u in October. She continues  TPN from 1 pm to next 7 AM. Nurse comes weekly on Wednesdays to check labs and change needle. She does not have difficulty with this. Eating very little by mouth, mostly ensure, carnation, and pudding. She is feeling stronger, more mobile and active each day. Stools more more normal, denies n/v/c/d. Denies abdominal pain. She gets winded on activity but resolves with rest. Denies cough, chest pain, fever, chills. She started Olaparib on 9/11, denies side effects so far.    MEDICAL HISTORY:  Past Medical History:  Diagnosis Date  . Anxiety   . Blood transfusion without reported diagnosis   . Breast CA ([place])    s/p lumpectomy and radiation 2000  . Breast cancer ([place])   . Colitis    2007  . Dyspnea   . Dysrhythmia   . Family history of bladder cancer   . Family history of stomach cancer   . Family history of throat cancer   . Hernia, epigastric   . HTN (hypertension)    "mild hypertension", was on BP med years ago but it caused orthostatic hypotension and she has not been medicated since  . Hypothyroid   . Pancreatic cancer ([place]) dx'd 03/2019  . Personal history of breast cancer   . Personal history of radiation therapy 2001  . Poor appetite 04/2019  . Vertigo   . Vertigo     SURGICAL HISTORY: Past Surgical History:  Procedure Laterality Date  . ABDOMINAL HYSTERECTOMY     89yr ago  . ANTERIOR CERVICAL DECOMP/DISCECTOMY FUSION N/A 08/02/2018   Procedure: ANTERIOR CERVICAL DECOMPRESSION/DISCECTOMY FUSION CERVICAL FIVE- CERVICAL SIX;  Surgeon: PEarnie Larsson MD;  [facility]  Service: Neurosurgery;  Laterality: N/A;  ANTERIOR CERVICAL DECOMPRESSION/DISCECTOMY FUSION CERVICAL FIVE-  CERVICAL SIX  . BILIARY BRUSHING  03/29/2019   Procedure: BILIARY BRUSHING;  Surgeon: [name], MD;  Location: [facility] ENDOSCOPY;  Service: Endoscopy;;  . BILIARY STENT PLACEMENT  03/29/2019   Procedure: BILIARY STENT PLACEMENT;  Surgeon: [name], MD;  Location: [facility] ENDOSCOPY;  Service: Endoscopy;;   . BIOPSY  03/29/2019   Procedure: BIOPSY;  Surgeon: [name], MD;  Location: [facility] ENDOSCOPY;  Service: Endoscopy;;  . BREAST LUMPECTOMY     2000  . ERCP N/A 03/29/2019   Procedure: ENDOSCOPIC RETROGRADE CHOLANGIOPANCREATOGRAPHY (ERCP);  Surgeon: [name], MD;  Location: [facility] ENDOSCOPY;  Service: Endoscopy;  Laterality: N/A;  . ESOPHAGOGASTRODUODENOSCOPY N/A 06/17/2019   Procedure: ESOPHAGOGASTRODUODENOSCOPY (EGD);  Surgeon: Gatha Mayer, MD;  Location: [facility] ENDOSCOPY;  Service: Gastroenterology;  Laterality: N/A;  . ESOPHAGOGASTRODUODENOSCOPY (EGD) WITH PROPOFOL N/A 03/29/2019   Procedure: ESOPHAGOGASTRODUODENOSCOPY (EGD) WITH PROPOFOL;  Surgeon: [name], MD;  Location: [facility] ENDOSCOPY;  Service: Endoscopy;  Laterality: N/A;  . ESOPHAGOGASTRODUODENOSCOPY (EGD) WITH PROPOFOL N/A 06/09/2019   Procedure: ESOPHAGOGASTRODUODENOSCOPY (EGD) WITH PROPOFOL;  Surgeon: [name], MD;  Location: [facility] ENDOSCOPY;  Service: Endoscopy;  Laterality: N/A;  . EUS N/A 03/29/2019   Procedure: UPPER ENDOSCOPIC ULTRASOUND (EUS) LINEAR;  Surgeon: [name], MD;  Location: [facility] ENDOSCOPY;  Service: Endoscopy;  Laterality: N/A;  . EUS N/A 06/09/2019   Procedure: UPPER ENDOSCOPIC ULTRASOUND (EUS) LINEAR;  Surgeon: [name], MD;  Location: [facility] ENDOSCOPY;  Service: Endoscopy;  Laterality: N/A;  . FIDUCIAL MARKER PLACEMENT  06/09/2019   Procedure: FIDUCIAL MARKER PLACEMENT;  Surgeon: [name], MD;  Location: [facility] ENDOSCOPY;  Service: Endoscopy;;  . FINE NEEDLE ASPIRATION  03/29/2019   Procedure: FINE NEEDLE ASPIRATION (FNA);  Surgeon: [name], MD;  Location: [facility] ENDOSCOPY;  Service: Endoscopy;;  . HERNIA REPAIR     20+ years ago  . PORTACATH PLACEMENT N/A 04/12/2019   Procedure: INSERTION PORT-A-CATH LEFT SUBCLAVIAN;  Surgeon: Stark Klein, MD;  Location: [place];  Service: General;  Laterality: N/A;  . RETINAL DETACHMENT SURGERY    . SPHINCTEROTOMY  03/29/2019    Procedure: SPHINCTEROTOMY;  Surgeon: [name], MD;  Location: [facility] ENDOSCOPY;  Service: Endoscopy;;    I have reviewed the social history and family history with the patient and they are unchanged from previous note.  ALLERGIES:  is allergic to codeine.  MEDICATIONS:  Current Outpatient Medications  Medication Sig Dispense Refill  . diltiazem (CARDIZEM CD) 180 MG 24 hr capsule Take 180 mg by mouth daily.    . hydrocortisone (ANUSOL-HC) 2.5 % rectal cream Place rectally 2 (two) times daily.    Marland Kitchen levothyroxine (SYNTHROID, LEVOTHROID) 50 MCG tablet Take 50 mcg by mouth every morning.    . metoCLOPramide (REGLAN) 5 MG tablet Take 1 tablet (5 mg total) by mouth 4 (four) times daily -  before meals and at bedtime. (Patient not taking: Reported on 08/04/2019)    . mirtazapine (REMERON) 7.5 MG tablet Take 1 tablet (7.5 mg total) by mouth at bedtime. 30 tablet 3  . Multiple Vitamin (MULTIVITAMIN WITH MINERALS) TABS tablet Take 1 tablet by mouth daily.    Marland Kitchen olaparib (LYNPARZA) 100 MG tablet Take 1 tablet (100 mg total) by mouth 2 (two) times daily. Swallow whole. May take with food to decrease nausea and vomiting. (Patient not taking: Reported on 07/14/2019) 60 tablet 0  . ondansetron (ZOFRAN-ODT) 8 MG disintegrating tablet Take 1 tablet (8 mg total) by mouth every  8 (eight) hours as needed for nausea or vomiting. (Patient not taking: Reported on 07/14/2019) 20 tablet 0  . pantoprazole (PROTONIX) 40 MG tablet Take 1 tablet (40 mg total) by mouth 2 (two) times daily.     No current facility-administered medications for this visit.     PHYSICAL EXAMINATION: ECOG PERFORMANCE STATUS: 1-2  No vitals for today's visit   Patient appears well over the phone. Speech is normal, non pressured. She is not coughing. Mood appears normal for situation  LABORATORY DATA:  No labs for today's visit   RADIOGRAPHIC STUDIES: I have personally reviewed the radiological images as listed and agreed with the  findings in the report. No results found.   ASSESSMENT & PLAN: Julie Rollins Holderis a 73 y.o.femalewith   1. Adenocarcinoma of pancreatic head, cT2N0M0, with duodenal involvement  -Diagnosed 03/2019, presented with painless juandice s/p ERCP with stenting (bili 23.9).  -She started neoadjuvant FOLFIRINOX on 04/13/19.S/p 2 cycles. Chemo held due to persistent nausea and poor appetite -she unfortunately developedduodenal obstruction due to the pancreatic cancer, was hospitalized, and started TPN in the hospital. -She completed SBRT on 06/28/19- 07/08/19.  -genetic test positive for BRCA2 mutation.Given her limited treatment optionsDr. [name] recommend starting oralPARP2 inhibitor Olaparib which she started on 08/05/19. Tolerating well so far     2. Partial Gastric Outlet Obstruction -Due to worsening N&V she washospitalizedon 06/13/19. She had partial gastric outlet obstruction otherwise upper GI workup negative. She had TPN placed, on liquid diet.  -currently living at home -on TPN, tolerating soft diet; not eating much by mouth  3. 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 Ardis Hughs on 03/29/19 -resolved  4.Persistent nausea, vomiting, diarrhea, and anorexia -She had these symptomsbefore chemo, got much worse after chemo -Since recent hospitalization for gastric outlet obstruction her symptoms are now manageable. Will monitor as this can indicate another obstruction. -Resolved  5.BRCA2 + mutation -Has personal history of breast cancer (DCIS) and pancreas cancer.She does have a brother who had bladder cancer -Her Genetic Testing results were negative exceptBRCA2 pathogenic variant called c.1929del (p.Arg645Glufs*15) identified. -She is eligible forPARP2 inhibitorOlaparibwhich she started 08/05/19   6. H/o left breast DCIS -diagnosed in 2000, s/p lumpectomy and adjuvant  radiation, managed by Dr. Margot Chimes -last mammo in 01/2018 negative for evidence of malignancy   7. Hypothyroidism -on Synthroid per PCP -will check TSH next visit per her request   8.Transaminitis -Her initial transaminitis and jaundice most resolved after stent placement -Persistent moderate transaminitis after chemo, total bilirubin normal, will continue monitoring -No liver metastasis on recent scan -alk phos remains mildly elevated, 127 on 08/04/19; will monitor   9.Limited social support -She lives alone, has very limited social support -uses [employer] transportation service  Disposition:  Ms. Holness appears stable. She continues to build strength. She continues TPN and limited diet by mouth. I refilled mirtazapine. She began PARP inhibitor olaparib 100 mg BID on 9/11, so appreciable side effects thus far. I reviewed recent labs such as CA 19-9 which is 622, increased from last month but improved from 05/2019. She will undergo restaging CT CAP on 9/21 and f/u next week. She is requesting we check thyroid function with labs. I placed the orders. She will f/u with Dr. Barry Dienes in October in anticipation of surgery in 09/2019.   All questions were answered. The patient knows to call the clinic with any problems, questions or concerns.  No barriers to learning was detected. I spent 13 minutes non-face-to-face time in today's encounter and more than 50% was on counseling and review of test results     Alla Feeling, NP 08/10/19

## 2019-08-11 ENCOUNTER — Telehealth: Payer: Self-pay | Admitting: Nurse Practitioner

## 2019-08-11 NOTE — Telephone Encounter (Signed)
Scheduled appt per 9/16 los.  Left a voice message of new appt date and time

## 2019-08-12 ENCOUNTER — Ambulatory Visit (HOSPITAL_COMMUNITY): Payer: Medicare HMO

## 2019-08-12 NOTE — Progress Notes (Signed)
[place]   [phone] [phone]   Clinic Follow up Note   Patient Care Team: Nolene Ebbs, MD as PCP - General (Internal Medicine) Stark Klein, MD as Consulting Physician (General Surgery) Truitt Merle, MD as Consulting Physician (Hematology) Alla Feeling, NP as Nurse Practitioner (Nurse Practitioner) Arna Snipe, RN as Oncology Nurse Navigator  Date of Service:  08/17/2019  CHIEF COMPLAINT: F/u of pancreatic cancer  SUMMARY OF ONCOLOGIC HISTORY: Oncology History  Carcinoma of head of pancreas ([place])  03/28/2019 Imaging   CT AP w contrast IMPRESSION: Interval development of 20 x 14 mm ill-defined low density in pancreatic head concerning for possible neoplasm or malignancy. This appears to be resulting in severe intrahepatic and extrahepatic biliary dilatation. ERCP is recommended for further evaluation.  Sigmoid diverticulosis without inflammation.   03/29/2019 Initial Biopsy   Diagnosis FINE NEEDLE ASPIRATION, ENDOSCOPIC EGUS, PANCREATIC HEAD MASS (SPECIMEN 1 OF 2 COLLECTED 03/29/2019) MALIGNANT CELLS CONSISTENT WITH ADENOCARCINOMA.   03/29/2019 Initial Biopsy   Diagnosis Duodenum, Biopsy, abnormal mucosa at major papilla - ADENOCARCINOMA. SEE NOTE Diagnosis Note The duodenal mucosa is negative for dysplasia. The findings are consistent with duodenal involvement from patient's suspected primary pancreatic adenocarcinoma. Dr. Melina Copa has reviewed this case and concurs with the above interpretation. Dr. Ardis Hughs was notified on 03/30/2019.   03/29/2019 Pathology Results   Diagnosis BILE DUCT BRUSHING (SPECIMEN 2 OF 2, COLLECTED ON 03/29/2019): HIGHLY SUSPICIOUS FOR MALIGNANCY.   03/29/2019 Procedure   ERCP with stenting of malignant distal CBD stricture per Dr. Ardis Hughs   04/04/2019 Initial Diagnosis   Malignant neoplasm of head of pancreas ([place])   04/08/2019 Imaging   CT Chest IMPRESSION: No evidence of metastatic disease in the  chest. Aortic Atherosclerosis (ICD10-I70.0).    04/13/2019 - 04/27/2019 Chemotherapy   neoadjuvant FOLFIRINOX q3weeks beginning 04/13/19; dose-reduced with cycle 1. Stopped afger 2 cycles due to poor toleration   05/20/2019 Imaging   CT AP W Contrast 05/20/19  05/20/19  IMPRESSION: 1. Mild increase in size of the infiltrative pancreatic head malignancy currently 1.9 by 1.6 cm, previously 1.7 by 1.2 cm by my measurements. No direct major vascular involvement or distant metastatic spread identified. The margin of the tumor is traversed by metallic stent, with resulting pneumobilia. Mildly increased dorsal pancreatic duct dilatation. 2. Diffuse hepatic steatosis. 3. Small hemangioma inferiorly in the right hepatic lobe, stable from 2007. 4. Mild wall thickening of the gallbladder, nonspecific. 5. 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 6. Other imaging findings of potential clinical significance: Sigmoid colon diverticulosis. Degenerative hip arthropathy bilaterally. Old healed sternal fracture. Stable wedge compression fracture at T12. Grade 1 anterolisthesis at L4-5.   06/08/2019 Genetic Testing   Positive genetic testing. 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 The Common Hereditary Cancers Panel offered by [employer] includes sequencing and/or deletion duplication testing of the following 48 genes: APC, ATM, AXIN2, BARD1, BMPR1A, BRCA1, BRCA2, BRIP1, CDH1, CDKN2A (p14ARF), CDKN2A (p16INK4a), CKD4, CHEK2, CTNNA1, DICER1, EPCAM (Deletion/duplication testing only), GREM1 (promoter region deletion/duplication testing only), KIT, MEN1, MLH1, MSH2, MSH3, MSH6, MUTYH, NBN, NF1, NHTL1, PALB2, PDGFRA, PMS2, POLD1, POLE, PTEN, RAD50, RAD51C, RAD51D, RNF43, SDHB, SDHC, SDHD, SMAD4, SMARCA4. STK11, TP53, TSC1, TSC2, and VHL.  The following genes were evaluated for sequence changes only: SDHA  and HOXB13 c.251G>A variant only. The report date is 06/08/2019.   06/09/2019 Procedure   EGD by Dr. Ardis Hughs 06/09/19  IMPRESSION - 4 fiducial markers were  placed into the pancreatic head mass via transduodenal EUS approach. - Duodenal edema and luminal narrowing in the distal duodenal bulb, likely from adjacent pancreatic mass. This is causing partial gastric outlet obstruction.   06/17/2019 Procedure   Upper Endoscopy by Dr. Carlean Purl 06/17/19  IMRPESSION - Acquired duodenal stenosis. SUSPECT FROM TUMOR AND MASS EFFECT - PARTIAL AND TIGHT - BILIARY STENT COULD BE CONTRIBUTING OR CAUSING THIS BY IRRITATING DUODENAL WALL BUT AT THIS POINT I DO NOT THINK SO - Slight fibrous food (residue) in the stomach. LARGELY CLEAR OF FLUID AND DEBRIS - The examination was otherwise normal. - No specimens collected.   06/28/2019 - 07/08/2019 Radiation Therapy   SBRT 06/28/19-07/08/19   08/05/2019 -  Chemotherapy   Began PARP inhibitor, Olaparib 100 mg BID on 08/05/2019        CURRENT THERAPY:  PARP-inhibitor Olaparib (Lynparza) 100 mg BID, started 08/05/19, dose reduced due to her kidney function and drug interaction with Cardizem PENDING [place] on 10/14/2019  INTERVAL HISTORY:  Julie Rollins is here for a follow up.  She is overall doing better.  She walked into the clinic by herself.  She is taking liquid nutrition supplement, including Ensure, breeze, Carnation breakfast, 3-4 bottles a day, she has not tried soft or solid food.  She is doing well with TPN, home care nurse come see me every Wednesday for port flush and the need to change.  He is able to manage the TPN by himself at home.  No fever, chills, nausea, diarrhea, or other side effects.  Her energy level has improved, she is able to tolerate routine activities at home.  Weight is stable.  REVIEW OF SYSTEMS:   Constitutional: Denies fevers, chills or abnormal weight loss, mild fatigue  Eyes: Denies blurriness of vision Ears, nose, mouth, throat,  and face: Denies mucositis or sore throat Respiratory: Denies cough, dyspnea or wheezes Cardiovascular: Denies palpitation, chest discomfort or lower extremity swelling Gastrointestinal:  Denies nausea, heartburn or change in bowel habits Skin: Denies abnormal skin rashes Lymphatics: Denies new lymphadenopathy or easy bruising Neurological:Denies numbness, tingling or new weaknesses Behavioral/Psych: Mood is stable, no new changes  All other systems were reviewed with the patient and are negative.  MEDICAL HISTORY:  Past Medical History:  Diagnosis Date   Anxiety    Blood transfusion without reported diagnosis    Breast CA ([place])    s/p lumpectomy and radiation 2000   Breast cancer ([place])    Colitis    2007   Dyspnea    Dysrhythmia    Family history of bladder cancer    Family history of stomach cancer    Family history of throat cancer    Hernia, epigastric    HTN (hypertension)    "mild hypertension", was on BP med years ago but it caused orthostatic hypotension and she has not been medicated since   Hypothyroid    Pancreatic cancer ([place]) dx'd 03/2019   Personal history of breast cancer    Personal history of radiation therapy 2001   Poor appetite 04/2019   Vertigo    Vertigo     SURGICAL HISTORY: Past Surgical History:  Procedure Laterality Date   ABDOMINAL HYSTERECTOMY     14yr ago   ANTERIOR CERVICAL DECOMP/DISCECTOMY FUSION N/A 08/02/2018   Procedure: ANTERIOR CERVICAL DECOMPRESSION/DISCECTOMY FUSION CERVICAL FIVE- CERVICAL SIX;  Surgeon: PEarnie Larsson MD;  [facility];  Service: Neurosurgery;  Laterality: N/A;  ANTERIOR CERVICAL DECOMPRESSION/DISCECTOMY FUSION CERVICAL FIVE- CERVICAL SIX   BILIARY  BRUSHING  03/29/2019   Procedure: BILIARY BRUSHING;  Surgeon: [name], MD;  Location: [facility] ENDOSCOPY;  Service: Endoscopy;;   BILIARY STENT PLACEMENT  03/29/2019   Procedure: BILIARY STENT PLACEMENT;  Surgeon: [name], MD;  Location[facility]  ENDOSCOPY;  Service: Endoscopy;;   BIOPSY  03/29/2019   Procedure: BIOPSY;  Surgeon: [name], MD;  Location: [facility] ENDOSCOPY;  Service: Endoscopy;;   BREAST LUMPECTOMY     2000   ERCP N/A 03/29/2019   Procedure: ENDOSCOPIC RETROGRADE CHOLANGIOPANCREATOGRAPHY (ERCP);  Surgeon: [name], MD;  Location: [facility] ENDOSCOPY;  Service: Endoscopy;  Laterality: N/A;   ESOPHAGOGASTRODUODENOSCOPY N/A 06/17/2019   Procedure: ESOPHAGOGASTRODUODENOSCOPY (EGD);  Surgeon: Gatha Mayer, MD;  Location: [facility] ENDOSCOPY;  Service: Gastroenterology;  Laterality: N/A;   ESOPHAGOGASTRODUODENOSCOPY (EGD) WITH PROPOFOL N/A 03/29/2019   Procedure: ESOPHAGOGASTRODUODENOSCOPY (EGD) WITH PROPOFOL;  Surgeon: [name], MD;  Location: [facility] ENDOSCOPY;  Service: Endoscopy;  Laterality: N/A;   ESOPHAGOGASTRODUODENOSCOPY (EGD) WITH PROPOFOL N/A 06/09/2019   Procedure: ESOPHAGOGASTRODUODENOSCOPY (EGD) WITH PROPOFOL;  Surgeon: [name], MD;  Location: [facility] ENDOSCOPY;  Service: Endoscopy;  Laterality: N/A;   EUS N/A 03/29/2019   Procedure: UPPER ENDOSCOPIC ULTRASOUND (EUS) LINEAR;  Surgeon: [name], MD;  Location: [facility] ENDOSCOPY;  Service: Endoscopy;  Laterality: N/A;   EUS N/A 06/09/2019   Procedure: UPPER ENDOSCOPIC ULTRASOUND (EUS) LINEAR;  Surgeon: [name], MD;  Location: [facility] ENDOSCOPY;  Service: Endoscopy;  Laterality: N/A;   FIDUCIAL MARKER PLACEMENT  06/09/2019   Procedure: FIDUCIAL MARKER PLACEMENT;  Surgeon: [name], MD;  Location: [facility] ENDOSCOPY;  Service: Endoscopy;;   FINE NEEDLE ASPIRATION  03/29/2019   Procedure: FINE NEEDLE ASPIRATION (FNA);  Surgeon: [name], MD;  Location: [facility] ENDOSCOPY;  Service: Endoscopy;;   HERNIA REPAIR     20+ years ago   PORTACATH PLACEMENT N/A 04/12/2019   Procedure: INSERTION PORT-A-CATH LEFT SUBCLAVIAN;  Surgeon: Stark Klein, MD;  Location: [place];  Service: General;  Laterality: N/A;   RETINAL DETACHMENT SURGERY       SPHINCTEROTOMY  03/29/2019   Procedure: SPHINCTEROTOMY;  Surgeon: [name], MD;  Location: [facility] ENDOSCOPY;  Service: Endoscopy;;    I have reviewed the social history and family history with the patient and they are unchanged from previous note.  ALLERGIES:  is allergic to codeine.  MEDICATIONS:  Current Outpatient Medications  Medication Sig Dispense Refill   diltiazem (CARDIZEM CD) 180 MG 24 hr capsule Take 180 mg by mouth daily.     hydrocortisone (ANUSOL-HC) 2.5 % rectal cream Place rectally 2 (two) times daily.     levothyroxine (SYNTHROID, LEVOTHROID) 50 MCG tablet Take 50 mcg by mouth every morning.     mirtazapine (REMERON) 7.5 MG tablet Take 1 tablet (7.5 mg total) by mouth at bedtime. 30 tablet 3   Multiple Vitamin (MULTIVITAMIN WITH MINERALS) TABS tablet Take 1 tablet by mouth daily.     olaparib (LYNPARZA) 100 MG tablet Take 1 tablet (100 mg total) by mouth 2 (two) times daily. Swallow whole. May take with food to decrease nausea and vomiting. 60 tablet 0   pantoprazole (PROTONIX) 40 MG tablet Take 1 tablet (40 mg total) by mouth 2 (two) times daily.     ondansetron (ZOFRAN-ODT) 8 MG disintegrating tablet Take 1 tablet (8 mg total) by mouth every 8 (eight) hours as needed for nausea or vomiting. (Patient not taking: Reported on 07/14/2019) 20 tablet 0   No current facility-administered  medications for this visit.     PHYSICAL EXAMINATION: ECOG PERFORMANCE STATUS: 2 - Symptomatic, <50% confined to bed  Vitals:   08/17/19 0814  BP: 131/63  Pulse: 91  Resp: 18  Temp: 98 F (36.7 C)  SpO2: 99%   Filed Weights   08/17/19 0814  Weight: 129 lb 1.6 oz (58.6 kg)   GENERAL:alert, no distress and comfortable SKIN: skin color, texture, turgor are normal, no rashes or significant lesions EYES: normal, Conjunctiva are pink and non-injected, sclera clear NECK: supple, thyroid normal size, non-tender, without nodularity LYMPH:  no palpable lymphadenopathy in the  cervical, axillary  LUNGS: clear to auscultation and percussion with normal breathing effort HEART: regular rate & rhythm and no murmurs and no lower extremity edema ABDOMEN:abdomen soft, non-tender and normal bowel sounds Musculoskeletal:no cyanosis of digits and no clubbing  NEURO: alert & oriented x 3 with fluent speech, no focal motor/sensory deficits  LABORATORY DATA:  I have reviewed the data as listed CBC Latest Ref Rng & Units 08/17/2019 08/04/2019 07/20/2019  WBC 4.0 - 10.5 K/uL 4.8 5.7 5.0  Hemoglobin 12.0 - 15.0 g/dL 10.8(L) 10.1(L) 8.8(L)  Hematocrit 36.0 - 46.0 % 33.6(L) 31.1(L) 27.3(L)  Platelets 150 - 400 K/uL 278 308 254     CMP Latest Ref Rng & Units 08/17/2019 08/04/2019 07/21/2019  Glucose 70 - 99 mg/dL 220(H) 144(H) 101(H)  BUN 8 - 23 mg/dL 31(H) 34(H) 8  Creatinine 0.44 - 1.00 mg/dL 0.94 0.80 0.86  Sodium 135 - 145 mmol/L 140 140 138  Potassium 3.5 - 5.1 mmol/L 4.8 4.1 3.7  Chloride 98 - 111 mmol/L 103 100 103  CO2 22 - 32 mmol/L '28 30 25  '$ Calcium 8.9 - 10.3 mg/dL 9.9 10.0 8.5(L)  Total Protein 6.5 - 8.1 g/dL 7.0 7.2 -  Total Bilirubin 0.3 - 1.2 mg/dL 0.3 0.3 -  Alkaline Phos 38 - 126 U/L 111 127(H) -  AST 15 - 41 U/L 16 20 -  ALT 0 - 44 U/L 15 22 -      RADIOGRAPHIC STUDIES: I have personally reviewed the radiological images as listed and agreed with the findings in the report. Ct Chest W Contrast  Result Date: 08/15/2019 CLINICAL DATA:  73 year old female with history of pancreatic neoplasm. Staging examination. EXAM: CT CHEST, ABDOMEN, AND PELVIS WITH CONTRAST TECHNIQUE: Multidetector CT imaging of the chest, abdomen and pelvis was performed following the standard protocol during bolus administration of intravenous contrast. CONTRAST:  187m OMNIPAQUE IOHEXOL 300 MG/ML  SOLN COMPARISON:  CT the abdomen and pelvis 05/20/2019. Chest CT 04/08/2019. FINDINGS: CT CHEST FINDINGS Cardiovascular: Heart size is borderline enlarged. There is no significant pericardial  fluid, thickening or pericardial calcification. No atherosclerotic calcifications in the thoracic aorta or the coronary arteries. Left subclavian single-lumen porta cath with tip terminating in the distal superior vena cava. Mediastinum/Nodes: No pathologically enlarged mediastinal or hilar lymph nodes. Please note that accurate exclusion of hilar adenopathy is limited on noncontrast CT scans. Esophagus is unremarkable in appearance. No axillary lymphadenopathy. Lungs/Pleura: No acute consolidative airspace disease. No pleural effusions. No suspicious appearing pulmonary nodules or masses are noted. Areas of linear scarring are noted throughout the lower lobes of the lungs bilaterally and in the posterior aspect of the left upper lobe, similar to the prior examination Musculoskeletal: Old healed fracture of the mid sternum with mild posttraumatic deformity. Chronic appearing compression fracture of T12 with 30% loss of anterior vertebral body height, unchanged. There are no aggressive appearing lytic or blastic  lesions noted in the visualized portions of the skeleton. CT ABDOMEN PELVIS FINDINGS Hepatobiliary: No suspicious cystic or solid hepatic lesions. No intra or extrahepatic biliary ductal dilatation. Gallbladder is normal in appearance. Pneumobilia, particularly in the left lobe of the liver related to indwelling common bile duct stent which is patent. Some high attenuation material within the common bile duct stent likely represents refluxed oral contrast material from the adjacent duodenum. Pancreas: The known pancreatic head mass appears slightly larger than the prior examination, but is poorly defined, estimated to measure approximately 2.9 x 2.1 x 3.3 cm (axial image 96 of series 2 and coronal image 32 of series 5). New compared to the prior study within the midst of the lesion there is some metallic densities, which presumably reflect the recently placed fiducial markers. Immediately anterior to this  intimately associated with the anterior margin of the pancreatic head as well as the posterior wall of the duodenal bulb there is a well-defined 1.6 x 1.2 x 1.5 cm centrally low-attenuation fluid collection, likely to represent a small pancreatic pseudocyst (axial image 97 of series 2 and coronal image 28 of series 5). Mild pancreatic ductal dilatation measuring up to 5 mm in the proximal body of the pancreas. Mild atrophy throughout the body and tail of the pancreas. Spleen: Unremarkable. Adrenals/Urinary Tract: 1.4 cm low-attenuation lesion in the anterior aspect of the lower pole the left kidney, compatible with a simple cyst. Right kidney and bilateral adrenal glands are normal in appearance. No hydroureteronephrosis. Urinary bladder is nearly decompressed, but otherwise unremarkable in appearance. Stomach/Bowel: Normal appearance of the stomach. No pathologic dilatation of small bowel or colon. Numerous colonic diverticulae are noted, particularly in the sigmoid colon, without surrounding inflammatory changes to suggest an acute diverticulitis at this time. Normal appendix. Vascular/Lymphatic: No significant atherosclerotic disease, aneurysm or dissection noted in the abdominal or pelvic vasculature. No lymphadenopathy noted in the abdomen or pelvis. Reproductive: Status post hysterectomy. Ovaries are not confidently identified may be surgically absent or atrophic. Other: No significant volume of ascites.  No pneumoperitoneum. Musculoskeletal: There are no aggressive appearing lytic or blastic lesions noted in the visualized portions of the skeleton. IMPRESSION: 1. Interval placement of fiducial markers in the lesion in the head of the pancreas. The pancreatic head mass appears slightly larger than the prior examination, estimated to measure approximately 2.9 x 2.1 x 3.3 cm on today's study. There continues to be some mild pancreatic ductal dilatation. Anterior to the lesion there is a small benign cystic  appearing structure between the head of the pancreas and the adjacent duodenal bulb, which may represent a tiny pancreatic pseudocyst in the setting of recent EGD for fiducial marker placement. Attention on follow-up studies is recommended. 2. No definite signs of metastatic disease elsewhere in the chest, abdomen or pelvis. 3. Extensive colonic diverticulosis without evidence to suggest an acute diverticulitis at this time. 4. Additional incidental findings, as above. Electronically Signed   By: Vinnie Langton M.D.   On: 08/15/2019 09:59   Ct Abdomen Pelvis W Contrast  Result Date: 08/15/2019 CLINICAL DATA:  73 year old female with history of pancreatic neoplasm. Staging examination. EXAM: CT CHEST, ABDOMEN, AND PELVIS WITH CONTRAST TECHNIQUE: Multidetector CT imaging of the chest, abdomen and pelvis was performed following the standard protocol during bolus administration of intravenous contrast. CONTRAST:  170m OMNIPAQUE IOHEXOL 300 MG/ML  SOLN COMPARISON:  CT the abdomen and pelvis 05/20/2019. Chest CT 04/08/2019. FINDINGS: CT CHEST FINDINGS Cardiovascular: Heart size is borderline enlarged. There is  no significant pericardial fluid, thickening or pericardial calcification. No atherosclerotic calcifications in the thoracic aorta or the coronary arteries. Left subclavian single-lumen porta cath with tip terminating in the distal superior vena cava. Mediastinum/Nodes: No pathologically enlarged mediastinal or hilar lymph nodes. Please note that accurate exclusion of hilar adenopathy is limited on noncontrast CT scans. Esophagus is unremarkable in appearance. No axillary lymphadenopathy. Lungs/Pleura: No acute consolidative airspace disease. No pleural effusions. No suspicious appearing pulmonary nodules or masses are noted. Areas of linear scarring are noted throughout the lower lobes of the lungs bilaterally and in the posterior aspect of the left upper lobe, similar to the prior examination  Musculoskeletal: Old healed fracture of the mid sternum with mild posttraumatic deformity. Chronic appearing compression fracture of T12 with 30% loss of anterior vertebral body height, unchanged. There are no aggressive appearing lytic or blastic lesions noted in the visualized portions of the skeleton. CT ABDOMEN PELVIS FINDINGS Hepatobiliary: No suspicious cystic or solid hepatic lesions. No intra or extrahepatic biliary ductal dilatation. Gallbladder is normal in appearance. Pneumobilia, particularly in the left lobe of the liver related to indwelling common bile duct stent which is patent. Some high attenuation material within the common bile duct stent likely represents refluxed oral contrast material from the adjacent duodenum. Pancreas: The known pancreatic head mass appears slightly larger than the prior examination, but is poorly defined, estimated to measure approximately 2.9 x 2.1 x 3.3 cm (axial image 96 of series 2 and coronal image 32 of series 5). New compared to the prior study within the midst of the lesion there is some metallic densities, which presumably reflect the recently placed fiducial markers. Immediately anterior to this intimately associated with the anterior margin of the pancreatic head as well as the posterior wall of the duodenal bulb there is a well-defined 1.6 x 1.2 x 1.5 cm centrally low-attenuation fluid collection, likely to represent a small pancreatic pseudocyst (axial image 97 of series 2 and coronal image 28 of series 5). Mild pancreatic ductal dilatation measuring up to 5 mm in the proximal body of the pancreas. Mild atrophy throughout the body and tail of the pancreas. Spleen: Unremarkable. Adrenals/Urinary Tract: 1.4 cm low-attenuation lesion in the anterior aspect of the lower pole the left kidney, compatible with a simple cyst. Right kidney and bilateral adrenal glands are normal in appearance. No hydroureteronephrosis. Urinary bladder is nearly decompressed, but  otherwise unremarkable in appearance. Stomach/Bowel: Normal appearance of the stomach. No pathologic dilatation of small bowel or colon. Numerous colonic diverticulae are noted, particularly in the sigmoid colon, without surrounding inflammatory changes to suggest an acute diverticulitis at this time. Normal appendix. Vascular/Lymphatic: No significant atherosclerotic disease, aneurysm or dissection noted in the abdominal or pelvic vasculature. No lymphadenopathy noted in the abdomen or pelvis. Reproductive: Status post hysterectomy. Ovaries are not confidently identified may be surgically absent or atrophic. Other: No significant volume of ascites.  No pneumoperitoneum. Musculoskeletal: There are no aggressive appearing lytic or blastic lesions noted in the visualized portions of the skeleton. IMPRESSION: 1. Interval placement of fiducial markers in the lesion in the head of the pancreas. The pancreatic head mass appears slightly larger than the prior examination, estimated to measure approximately 2.9 x 2.1 x 3.3 cm on today's study. There continues to be some mild pancreatic ductal dilatation. Anterior to the lesion there is a small benign cystic appearing structure between the head of the pancreas and the adjacent duodenal bulb, which may represent a tiny pancreatic pseudocyst in the setting  of recent EGD for fiducial marker placement. Attention on follow-up studies is recommended. 2. No definite signs of metastatic disease elsewhere in the chest, abdomen or pelvis. 3. Extensive colonic diverticulosis without evidence to suggest an acute diverticulitis at this time. 4. Additional incidental findings, as above. Electronically Signed   By: Vinnie Langton M.D.   On: 08/15/2019 09:59     ASSESSMENT & PLAN:  Julie Rollins is a 73 y.o. female with    1. Adenocarcinoma of pancreatic head, cT2N0M0, with duodenal involvement  -Diagnosed 03/2019, presented with painless juandice s/p ERCP with stenting (bili  23.9).  -She started neoadjuvant FOLFIRINOX on 04/13/19. S/p 2 cycles. Chemo held due to persistent nausea and poor appetite  -she unfortunately developed duodenal obstruction due to the pancreatic cancer, was hospitalized, and started TPN in the hospital.  -She underwent SBRT 06/28/19-07/08/19.  -Her recent genetic test results was positive for BRCA2 mutation. Given her limited treatment options I started her on oral PARP2 inhibitor Olaparib '100mg'$  BID on 08/05/19.  Dose reduced due to her decreased kidney function and interaction with Cardizem -She is tolerating olaparib very well, no significant side effects, will continue, until her Whipple surgery  -She is scheduled for Whipple surgery on November 3, I explained to her that we would like her get stronger before the Whipple surgery, and continue TPN -I also encouraged her to try some soft diet, such as soup, applesauce, etc -Due to her recent restaging CT scan, overall stable disease, no metastatic disease -Lab reviewed, anemia improved, no other cytopenia, CMP within normal limits except blood glucose 220 -f/u in 4 weeks    2. Partial Gastric Outlet Obstruction  -Due to worsening N&V she was hospitalized on 06/13/19. She had partial gastric outlet obstruction otherwise upper GI workup negative. She had TPN placed, on liquid diet.  -She is currently in rehab until 8/17. She plans to start tube feeding upon discharge. Will contact Dr. Barry Dienes about how long she should be on tube feeding.  -She completed SBRT on 07/08/19. She is still on clear liquid diet. I encourage her to try soft diet now   3. 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 Ardis Hughs on 03/29/19 -Her jaundice has resolved  4.Persistent nausea, vomiting, diarrhea, and anorexia  -She had these symptomsbefore chemo, got much worse after chemo -Since recent hospitalization for gastric  outlet obstruction her symptoms are now much improved. 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 Margot Chimes -last mammo in 01/2018 negative for evidence of malignancy   7. Hypothyroidism -on Synthroid per PCP  8.Transaminitis -Her initial transaminitis and jaundice most resolved after stent placement -Persistent moderate transaminitis after chemo, total bilirubin normal, will continue monitoring -No liver metastasis on recent scan.  9. Limited social support  -She lives alone, has very limited social support -Our cancer center has been offering transportation to support her  PLAN: -lab and scan reviewed, will continue Olaparib at '100mg'$  bid  -Whipple surgery is scheduled for November 3rd -lab, flush and follow-up in 4 weeks -  She will continue TPN daily at home, and she will try soft diet   No problem-specific Assessment & Plan notes found for this encounter.   No orders of the defined types were placed in this encounter.  All questions were answered. The patient knows to call the clinic with any problems, questions or concerns. No barriers to learning was detected. I spent 20 minutes counseling the patient face to face. The total time spent in the appointment was 25 minutes and more than 50% was on counseling and review of test results     Truitt Merle, MD 08/17/2019   I, Joslyn Devon, am acting as scribe for Truitt Merle, MD.   I have reviewed the above documentation for accuracy and completeness, and I agree with the above.

## 2019-08-15 ENCOUNTER — Ambulatory Visit (HOSPITAL_COMMUNITY)
Admission: RE | Admit: 2019-08-15 | Discharge: 2019-08-15 | Disposition: A | Discharge status: Home / Self Care | Payer: Medicare HMO | Source: Ambulatory Visit | Attending: Nurse Practitioner | Admitting: Nurse Practitioner

## 2019-08-15 ENCOUNTER — Other Ambulatory Visit: Payer: Self-pay

## 2019-08-15 DIAGNOSIS — D490 Neoplasm of unspecified behavior of digestive system: Secondary | ICD-10-CM | POA: Insufficient documentation

## 2019-08-15 MED ORDER — IOHEXOL 300 MG/ML  SOLN
100.0000 mL | Freq: Once | INTRAMUSCULAR | Status: AC | PRN
  Administered 2019-08-15: 09:00:00 100 mL via INTRAVENOUS

## 2019-08-15 MED ORDER — SODIUM CHLORIDE (PF) 0.9 % IJ SOLN
INTRAMUSCULAR | Status: AC
  Filled 2019-08-15: qty 50

## 2019-08-16 DIAGNOSIS — E440 Moderate protein-calorie malnutrition: Secondary | ICD-10-CM | POA: Insufficient documentation

## 2019-08-17 ENCOUNTER — Other Ambulatory Visit: Payer: Self-pay

## 2019-08-17 ENCOUNTER — Telehealth: Payer: Self-pay | Admitting: Hematology

## 2019-08-17 ENCOUNTER — Inpatient Hospital Stay: Payer: Medicare HMO | Admitting: Hematology

## 2019-08-17 ENCOUNTER — Encounter: Payer: Self-pay | Admitting: Hematology

## 2019-08-17 ENCOUNTER — Inpatient Hospital Stay: Payer: Medicare HMO

## 2019-08-17 ENCOUNTER — Telehealth: Payer: Self-pay

## 2019-08-17 VITALS — BP 131/63 | HR 91 | Temp 98.0°F | Resp 18 | Ht 64.0 in | Wt 129.1 lb

## 2019-08-17 DIAGNOSIS — Z1501 Genetic susceptibility to malignant neoplasm of breast: Secondary | ICD-10-CM

## 2019-08-17 DIAGNOSIS — C250 Malignant neoplasm of head of pancreas: Secondary | ICD-10-CM | POA: Diagnosis not present

## 2019-08-17 DIAGNOSIS — Z1502 Genetic susceptibility to malignant neoplasm of ovary: Secondary | ICD-10-CM

## 2019-08-17 DIAGNOSIS — E440 Moderate protein-calorie malnutrition: Secondary | ICD-10-CM | POA: Diagnosis not present

## 2019-08-17 DIAGNOSIS — Z1509 Genetic susceptibility to other malignant neoplasm: Secondary | ICD-10-CM

## 2019-08-17 LAB — CBC WITH DIFFERENTIAL (CANCER CENTER ONLY)
Abs Immature Granulocytes: 0.01 10*3/uL (ref 0.00–0.07)
Basophils Absolute: 0 10*3/uL (ref 0.0–0.1)
Basophils Relative: 1 %
Eosinophils Absolute: 0.2 10*3/uL (ref 0.0–0.5)
Eosinophils Relative: 4 %
HCT: 33.6 % — ABNORMAL LOW (ref 36.0–46.0)
Hemoglobin: 10.8 g/dL — ABNORMAL LOW (ref 12.0–15.0)
Immature Granulocytes: 0 %
Lymphocytes Relative: 15 %
Lymphs Abs: 0.7 10*3/uL (ref 0.7–4.0)
MCH: 31.8 pg (ref 26.0–34.0)
MCHC: 32.1 g/dL (ref 30.0–36.0)
MCV: 98.8 fL (ref 80.0–100.0)
Monocytes Absolute: 0.3 10*3/uL (ref 0.1–1.0)
Monocytes Relative: 6 %
Neutro Abs: 3.6 10*3/uL (ref 1.7–7.7)
Neutrophils Relative %: 74 %
Platelet Count: 278 10*3/uL (ref 150–400)
RBC: 3.4 MIL/uL — ABNORMAL LOW (ref 3.87–5.11)
RDW: 14.7 % (ref 11.5–15.5)
WBC Count: 4.8 10*3/uL (ref 4.0–10.5)
nRBC: 0 % (ref 0.0–0.2)

## 2019-08-17 LAB — CMP (CANCER CENTER ONLY)
ALT: 15 U/L (ref 0–44)
AST: 16 U/L (ref 15–41)
Albumin: 3.6 g/dL (ref 3.5–5.0)
Alkaline Phosphatase: 111 U/L (ref 38–126)
Anion gap: 9 (ref 5–15)
BUN: 31 mg/dL — ABNORMAL HIGH (ref 8–23)
CO2: 28 mmol/L (ref 22–32)
Calcium: 9.9 mg/dL (ref 8.9–10.3)
Chloride: 103 mmol/L (ref 98–111)
Creatinine: 0.94 mg/dL (ref 0.44–1.00)
GFR, Est AFR Am: >60 mL/min (ref >60)
GFR, Estimated: 59 mL/min — ABNORMAL LOW (ref >60)
Glucose, Bld: 220 mg/dL — ABNORMAL HIGH (ref 70–99)
Potassium: 4.8 mmol/L (ref 3.5–5.1)
Sodium: 140 mmol/L (ref 135–145)
Total Bilirubin: 0.3 mg/dL (ref 0.3–1.2)
Total Protein: 7 g/dL (ref 6.5–8.1)

## 2019-08-17 LAB — TSH: TSH: 3.371 u[IU]/mL (ref 0.308–3.960)

## 2019-08-17 NOTE — Progress Notes (Signed)
  Radiation Oncology         [phone] ________________________________  Name: Julie Rollins MRN: [id number]  Date: 07/08/2019  DOB: 04/19/1946  End of Treatment Note  Diagnosis:   pancreatic cancer     Indication for treatment::  curative       Radiation treatment dates:   06/28/19 - 07/08/19  Site/dose:   the abdomen was treated to a dose of 33 Gy in 5 fractions, corresponding to a course of SBRT.  Narrative: The patient tolerated radiation treatment relatively well.   No major issues of nausea, diarrhea.  Plan: The patient has completed radiation treatment. The patient will return to radiation oncology clinic for routine followup in one month. I advised the patient to call or return sooner if they have any questions or concerns related to their recovery or treatment. ________________________________  Jodelle Gross, M.D., Ph.D.

## 2019-08-17 NOTE — Telephone Encounter (Signed)
Received call from Denise-RN with [facility] saying patient's PAC was bothering patient and not giving blood return. Patient was in earlier this morning to see Dr. [name] and did not mention any discomfort from [facility]. Called Langley Gauss back to get some more information, and she said that patient mentioned her PAC had to be re-accesed for her CT scan on Monday 08/15/19 and was bothering her ever since. Langley Gauss was not able to get blood return from that needle, so she deaccessed and reaccessed the patient's PAC. Patient reported the new needle was very uncomfortable so Denise deaccessed and reaccessed again. Patient stated the new needle was more comfortable, but stung slightly at the insertion site. Langley Gauss reported she was able to flush the [facility] without issue but was not able to get blood return. She tried to get the labs she needed for TPN from patient's right arm but was unsuccessful. She called the patient's pharmacy and they were comfortable using the patient's labs from last home health visit, but Langley Gauss was concerned as to why patient's port wasn't giving blood return. Corky Crafts I would update Dr. [name] on the situation and call her back.   Updated Dr. [name] who advised that if patient is able, she should come back in for lab and [facility] flush tomorrow 08/18/19, and if her [facility] doesn't give blood return again we can instill CathFlow. She also mentioned that if there were additional labs [place] needed we could draw them and fax them to the agency. Called Langley Gauss back with an update, and she stated that the only additional labs she would need that weren't drawn today would be: phospherus, magnesium, triglycerides, and prealbumin. I told her I would add those labs and then fax all the results over to [facility] tomorrow. Denise voiced appreciation and denied any further needs.   Lab orders placed and high priority scheduling message sent to make patient a lab/flush for 08/18/19.Tried calling the patient x3 to let her know the  plan, but was unable to reach patient or leave voice mail. Will follow up with patient again tomorrow morning.

## 2019-08-17 NOTE — Telephone Encounter (Signed)
Scheduled appt per 9/23 los. ° °Spoke with patient and she is aware of the appt date and time. °

## 2019-08-18 ENCOUNTER — Inpatient Hospital Stay: Payer: Medicare HMO

## 2019-08-18 ENCOUNTER — Ambulatory Visit: Payer: Medicare HMO | Admitting: Hematology

## 2019-08-18 ENCOUNTER — Other Ambulatory Visit: Payer: Self-pay

## 2019-08-18 ENCOUNTER — Telehealth: Payer: Self-pay

## 2019-08-18 ENCOUNTER — Other Ambulatory Visit: Payer: Medicare HMO

## 2019-08-18 DIAGNOSIS — C250 Malignant neoplasm of head of pancreas: Secondary | ICD-10-CM | POA: Diagnosis not present

## 2019-08-18 DIAGNOSIS — Z95828 Presence of other vascular implants and grafts: Secondary | ICD-10-CM

## 2019-08-18 DIAGNOSIS — E440 Moderate protein-calorie malnutrition: Secondary | ICD-10-CM

## 2019-08-18 LAB — LIPID PANEL
Cholesterol: 176 mg/dL (ref 0–200)
HDL: 50 mg/dL (ref >40)
LDL Cholesterol: 111 mg/dL — ABNORMAL HIGH (ref 0–99)
Total CHOL/HDL Ratio: 3.5 RATIO
Triglycerides: 75 mg/dL (ref <150)
VLDL: 15 mg/dL (ref 0–40)

## 2019-08-18 LAB — PREALBUMIN: Prealbumin: 33.4 mg/dL (ref 18–38)

## 2019-08-18 LAB — MAGNESIUM: Magnesium: 2 mg/dL (ref 1.7–2.4)

## 2019-08-18 LAB — PHOSPHORUS: Phosphorus: 4.2 mg/dL (ref 2.5–4.6)

## 2019-08-18 MED ORDER — ALTEPLASE 2 MG IJ SOLR
INTRAMUSCULAR | Status: AC
  Filled 2019-08-18: qty 2

## 2019-08-18 MED ORDER — HEPARIN SOD (PORK) LOCK FLUSH 100 UNIT/ML IV SOLN
500.0000 [IU] | Freq: Once | INTRAVENOUS | Status: AC | PRN
  Administered 2019-08-18: 11:00:00 500 [IU]
  Filled 2019-08-18: qty 5

## 2019-08-18 MED ORDER — ALTEPLASE 2 MG IJ SOLR
2.0000 mg | Freq: Once | INTRAMUSCULAR | Status: AC | PRN
  Administered 2019-08-18: 2 mg
  Filled 2019-08-18: qty 2

## 2019-08-18 MED ORDER — SODIUM CHLORIDE 0.9% FLUSH
10.0000 mL | Freq: Once | INTRAVENOUS | Status: AC | PRN
  Administered 2019-08-18: 11:00:00 10 mL
  Filled 2019-08-18: qty 10

## 2019-08-18 NOTE — Progress Notes (Signed)
Unable to get blood from pt port. reacessed port that was inserted by home nurse yesterday. Labs drawn from hand by Santiago Glad LPN and cathflo put in my Herbalist. Patient waited for a little over an hour and there was still no blood return from port. Patient stated that she did not want to wait any longer and was ready to go. I let her know that it would be best if she still waited since we still were unable to get blood from port and that home health nurse would still need to use port next wednesday. Patient stated that she still wanted to leave and was tired of waiting. I removed cathflo, flushed and heparin locked port. Take home dressing and bio patch was applied.

## 2019-08-18 NOTE — Progress Notes (Signed)
Lab work from 08/17/19 and 08/18/19 faxed to [place] with [facility] [phone]. Dye study placed for evaluation of port and IR called to try and get patient scheduled. Spoke with Tiffany in IR who said she called patient and left a voicemail with patient to have her call Tiffany back to get appointment scheduled.   Will F/U with patient early next week to ensure she has made appointment with IR and doesn't need anything else.

## 2019-08-18 NOTE — Telephone Encounter (Signed)
Notified by scheduling that patient was upset about having to come into [facility] again. Called patient's home phone and spoke with patient to explain reason for visit (to have one of our flush nurses evaluate her port and draw the remaining labs [facility] wasn't able to collect yesterday). Patient verbalized understanding and agreed to come at 10:15 for her lab appointment followed by 10:30 flush. Patient expressed no other needs at this time.   Will call flush to update them on situation

## 2019-08-22 ENCOUNTER — Telehealth: Payer: Self-pay | Admitting: Hematology

## 2019-08-22 NOTE — Telephone Encounter (Signed)
Patient called to reschedule nut appt

## 2019-08-23 ENCOUNTER — Other Ambulatory Visit: Payer: Self-pay

## 2019-08-23 ENCOUNTER — Ambulatory Visit (HOSPITAL_COMMUNITY)
Admission: RE | Admit: 2019-08-23 | Discharge: 2019-08-23 | Disposition: A | Discharge status: Home / Self Care | Payer: Medicare HMO | Source: Ambulatory Visit | Attending: Hematology | Admitting: Hematology

## 2019-08-23 ENCOUNTER — Encounter (HOSPITAL_COMMUNITY): Payer: Self-pay | Admitting: Interventional Radiology

## 2019-08-23 DIAGNOSIS — Z452 Encounter for adjustment and management of vascular access device: Secondary | ICD-10-CM | POA: Insufficient documentation

## 2019-08-23 DIAGNOSIS — Z95828 Presence of other vascular implants and grafts: Secondary | ICD-10-CM

## 2019-08-23 HISTORY — PX: IR CV LINE INJECTION: IMG2294

## 2019-08-23 MED ORDER — HEPARIN SOD (PORK) LOCK FLUSH 100 UNIT/ML IV SOLN
INTRAVENOUS | Status: AC
  Filled 2019-08-23: qty 5

## 2019-08-23 MED ORDER — IOHEXOL 300 MG/ML  SOLN
50.0000 mL | Freq: Once | INTRAMUSCULAR | Status: AC | PRN
  Administered 2019-08-23: 10 mL via INTRAVENOUS

## 2019-08-29 ENCOUNTER — Telehealth: Payer: Self-pay

## 2019-08-29 NOTE — Telephone Encounter (Signed)
Contacted patient to verify telephone visit for pre reg °

## 2019-08-30 ENCOUNTER — Inpatient Hospital Stay: Payer: Medicare HMO | Attending: Nurse Practitioner

## 2019-08-30 NOTE — Progress Notes (Signed)
Nutrition Follow-up:  Patient with pancreatic cancer with duodenal involvement.  Patient with gastric outlet obstruction requiring TPN.  Notes reviewed from hospital admissions.  Patient currently on lynparza.  Planning Whipple procedure on 11/3.  Patient will remain on TPN and liquid diet until surgery on 11/3.    Spoke with patient via phone for nutrition follow-up.  Patient reports that she is feeling better and stronger.  Home health is providing care for TPN.  Patient is taking mostly ensure, carnation instant breakfast, applesauce, few bites baked potato, ice cream, sherbet.  Does not really like soups.  Also reports that she is drinking mostly water, sometimes sprite.    Patient is currently at home alone and reports that she will likely go to rehab following surgery on 11/3.      Medications: reviewed  Labs: reviewed  Anthropometrics:   Weight has increased to 129 lb 1.6 oz on 9/23 from 113 lb on 7/30   NUTRITION DIAGNOSIS: Inadequate oral intake continues relying on TPN due to GOO   INTERVENTION:  Patient being followed by [facility] for TPN.  TPN to continue until surgery.  Diet as tolerated per MD guidance.   Patient has contact information    MONITORING, EVALUATION, GOAL: Patient will continue nutrition support to maintain nutrition   NEXT VISIT: to be determined  Pahoua Schreiner B. Zenia Resides, [place], [place] Registered Dietitian [phone] (pager)

## 2019-09-05 ENCOUNTER — Telehealth: Payer: Self-pay

## 2019-09-05 ENCOUNTER — Other Ambulatory Visit: Payer: Self-pay

## 2019-09-05 DIAGNOSIS — C250 Malignant neoplasm of head of pancreas: Secondary | ICD-10-CM

## 2019-09-05 MED ORDER — MIRTAZAPINE 7.5 MG PO TABS: 7.5000 mg | ORAL_TABLET | Freq: Every day | ORAL | 3 refills | Status: DC

## 2019-09-05 MED ORDER — OLAPARIB 100 MG PO TABS: 100.0000 mg | ORAL_TABLET | Freq: Two times a day (BID) | ORAL | 0 refills | Status: DC

## 2019-09-05 NOTE — Telephone Encounter (Signed)
Spoke with patient instructed her to continue her [place] until she has her surgery.  She verbalized an understanding.

## 2019-09-06 MED FILL — LYNPARZA 100 MG TABS: 100 | 30 days supply | Qty: 60 | Fill #0

## 2019-09-09 ENCOUNTER — Observation Stay (HOSPITAL_COMMUNITY): Payer: Medicare HMO

## 2019-09-09 ENCOUNTER — Other Ambulatory Visit: Payer: Self-pay

## 2019-09-09 ENCOUNTER — Encounter (HOSPITAL_COMMUNITY): Payer: Self-pay | Admitting: *Deleted

## 2019-09-09 ENCOUNTER — Inpatient Hospital Stay (HOSPITAL_COMMUNITY)
Admission: EM | Admit: 2019-09-09 | Discharge: 2019-09-17 | DRG: 270 | Disposition: A | Discharge status: Home / Self Care | Payer: Medicare HMO | Attending: Internal Medicine | Admitting: Internal Medicine

## 2019-09-09 ENCOUNTER — Emergency Department (HOSPITAL_COMMUNITY): Payer: Medicare HMO

## 2019-09-09 DIAGNOSIS — C250 Malignant neoplasm of head of pancreas: Secondary | ICD-10-CM

## 2019-09-09 DIAGNOSIS — Z20828 Contact with and (suspected) exposure to other viral communicable diseases: Secondary | ICD-10-CM | POA: Diagnosis present

## 2019-09-09 DIAGNOSIS — H9201 Otalgia, right ear: Secondary | ICD-10-CM | POA: Diagnosis present

## 2019-09-09 DIAGNOSIS — K8689 Other specified diseases of pancreas: Secondary | ICD-10-CM

## 2019-09-09 DIAGNOSIS — E039 Hypothyroidism, unspecified: Secondary | ICD-10-CM | POA: Diagnosis not present

## 2019-09-09 DIAGNOSIS — D701 Agranulocytosis secondary to cancer chemotherapy: Secondary | ICD-10-CM | POA: Diagnosis present

## 2019-09-09 DIAGNOSIS — R531 Weakness: Secondary | ICD-10-CM

## 2019-09-09 DIAGNOSIS — D6481 Anemia due to antineoplastic chemotherapy: Secondary | ICD-10-CM | POA: Diagnosis present

## 2019-09-09 DIAGNOSIS — Z79899 Other long term (current) drug therapy: Secondary | ICD-10-CM

## 2019-09-09 DIAGNOSIS — I447 Left bundle-branch block, unspecified: Secondary | ICD-10-CM | POA: Diagnosis present

## 2019-09-09 DIAGNOSIS — Z95828 Presence of other vascular implants and grafts: Secondary | ICD-10-CM

## 2019-09-09 DIAGNOSIS — T80211A Bloodstream infection due to central venous catheter, initial encounter: Principal | ICD-10-CM | POA: Diagnosis present

## 2019-09-09 DIAGNOSIS — Z923 Personal history of irradiation: Secondary | ICD-10-CM

## 2019-09-09 DIAGNOSIS — A411 Sepsis due to other specified staphylococcus: Secondary | ICD-10-CM | POA: Diagnosis present

## 2019-09-09 DIAGNOSIS — Z9221 Personal history of antineoplastic chemotherapy: Secondary | ICD-10-CM

## 2019-09-09 DIAGNOSIS — Z8619 Personal history of other infectious and parasitic diseases: Secondary | ICD-10-CM

## 2019-09-09 DIAGNOSIS — F419 Anxiety disorder, unspecified: Secondary | ICD-10-CM | POA: Diagnosis present

## 2019-09-09 DIAGNOSIS — Z885 Allergy status to narcotic agent status: Secondary | ICD-10-CM

## 2019-09-09 DIAGNOSIS — K831 Obstruction of bile duct: Secondary | ICD-10-CM | POA: Diagnosis present

## 2019-09-09 DIAGNOSIS — Z8701 Personal history of pneumonia (recurrent): Secondary | ICD-10-CM

## 2019-09-09 DIAGNOSIS — A419 Sepsis, unspecified organism: Secondary | ICD-10-CM | POA: Diagnosis present

## 2019-09-09 DIAGNOSIS — Z808 Family history of malignant neoplasm of other organs or systems: Secondary | ICD-10-CM

## 2019-09-09 DIAGNOSIS — Z981 Arthrodesis status: Secondary | ICD-10-CM

## 2019-09-09 DIAGNOSIS — T451X5A Adverse effect of antineoplastic and immunosuppressive drugs, initial encounter: Secondary | ICD-10-CM | POA: Diagnosis not present

## 2019-09-09 DIAGNOSIS — Z90710 Acquired absence of both cervix and uterus: Secondary | ICD-10-CM

## 2019-09-09 DIAGNOSIS — E440 Moderate protein-calorie malnutrition: Secondary | ICD-10-CM | POA: Diagnosis present

## 2019-09-09 DIAGNOSIS — Z79890 Hormone replacement therapy (postmenopausal): Secondary | ICD-10-CM

## 2019-09-09 DIAGNOSIS — Z853 Personal history of malignant neoplasm of breast: Secondary | ICD-10-CM

## 2019-09-09 DIAGNOSIS — Z800 Family history of malignant neoplasm of digestive organs: Secondary | ICD-10-CM

## 2019-09-09 DIAGNOSIS — H9190 Unspecified hearing loss, unspecified ear: Secondary | ICD-10-CM | POA: Diagnosis present

## 2019-09-09 DIAGNOSIS — R112 Nausea with vomiting, unspecified: Secondary | ICD-10-CM | POA: Diagnosis not present

## 2019-09-09 DIAGNOSIS — I10 Essential (primary) hypertension: Secondary | ICD-10-CM | POA: Diagnosis present

## 2019-09-09 DIAGNOSIS — Z8052 Family history of malignant neoplasm of bladder: Secondary | ICD-10-CM

## 2019-09-09 DIAGNOSIS — D8481 Immunodeficiency due to conditions classified elsewhere: Secondary | ICD-10-CM | POA: Diagnosis present

## 2019-09-09 DIAGNOSIS — K311 Adult hypertrophic pyloric stenosis: Secondary | ICD-10-CM

## 2019-09-09 DIAGNOSIS — Y848 Other medical procedures as the cause of abnormal reaction of the patient, or of later complication, without mention of misadventure at the time of the procedure: Secondary | ICD-10-CM | POA: Diagnosis present

## 2019-09-09 DIAGNOSIS — Z6823 Body mass index (BMI) 23.0-23.9, adult: Secondary | ICD-10-CM

## 2019-09-09 DIAGNOSIS — R509 Fever, unspecified: Secondary | ICD-10-CM | POA: Diagnosis not present

## 2019-09-09 DIAGNOSIS — H9319 Tinnitus, unspecified ear: Secondary | ICD-10-CM | POA: Diagnosis present

## 2019-09-09 LAB — COMPREHENSIVE METABOLIC PANEL
ALT: 22 U/L (ref 0–44)
AST: 25 U/L (ref 15–41)
Albumin: 3.9 g/dL (ref 3.5–5.0)
Alkaline Phosphatase: 102 U/L (ref 38–126)
Anion gap: 10 (ref 5–15)
BUN: 36 mg/dL — ABNORMAL HIGH (ref 8–23)
CO2: 26 mmol/L (ref 22–32)
Calcium: 9.8 mg/dL (ref 8.9–10.3)
Chloride: 102 mmol/L (ref 98–111)
Creatinine, Ser: 0.82 mg/dL (ref 0.44–1.00)
GFR calc Af Amer: >60 mL/min (ref >60)
GFR calc non Af Amer: >60 mL/min (ref >60)
Glucose, Bld: 154 mg/dL — ABNORMAL HIGH (ref 70–99)
Potassium: 3.8 mmol/L (ref 3.5–5.1)
Sodium: 138 mmol/L (ref 135–145)
Total Bilirubin: 0.5 mg/dL (ref 0.3–1.2)
Total Protein: 7.4 g/dL (ref 6.5–8.1)

## 2019-09-09 LAB — CBC WITH DIFFERENTIAL/PLATELET
Abs Immature Granulocytes: 0.03 10*3/uL (ref 0.00–0.07)
Basophils Absolute: 0 10*3/uL (ref 0.0–0.1)
Basophils Relative: 0 %
Eosinophils Absolute: 0.1 10*3/uL (ref 0.0–0.5)
Eosinophils Relative: 1 %
HCT: 33.9 % — ABNORMAL LOW (ref 36.0–46.0)
Hemoglobin: 11.1 g/dL — ABNORMAL LOW (ref 12.0–15.0)
Immature Granulocytes: 0 %
Lymphocytes Relative: 4 %
Lymphs Abs: 0.3 10*3/uL — ABNORMAL LOW (ref 0.7–4.0)
MCH: 33.5 pg (ref 26.0–34.0)
MCHC: 32.7 g/dL (ref 30.0–36.0)
MCV: 102.4 fL — ABNORMAL HIGH (ref 80.0–100.0)
Monocytes Absolute: 0.6 10*3/uL (ref 0.1–1.0)
Monocytes Relative: 6 %
Neutro Abs: 8.1 10*3/uL — ABNORMAL HIGH (ref 1.7–7.7)
Neutrophils Relative %: 89 %
Platelets: 234 10*3/uL (ref 150–400)
RBC: 3.31 MIL/uL — ABNORMAL LOW (ref 3.87–5.11)
RDW: 18 % — ABNORMAL HIGH (ref 11.5–15.5)
WBC: 9.1 10*3/uL (ref 4.0–10.5)
nRBC: 0 % (ref 0.0–0.2)

## 2019-09-09 LAB — LIPASE, BLOOD: Lipase: 40 U/L (ref 11–51)

## 2019-09-09 LAB — LACTIC ACID, PLASMA: Lactic Acid, Venous: 1.3 mmol/L (ref 0.5–1.9)

## 2019-09-09 LAB — PROTIME-INR
INR: 1 (ref 0.8–1.2)
Prothrombin Time: 13 seconds (ref 11.4–15.2)

## 2019-09-09 LAB — URINALYSIS, ROUTINE W REFLEX MICROSCOPIC
Bacteria, UA: NONE SEEN
Bilirubin Urine: NEGATIVE
Glucose, UA: NEGATIVE mg/dL
Hgb urine dipstick: NEGATIVE
Ketones, ur: NEGATIVE mg/dL
Nitrite: NEGATIVE
Protein, ur: NEGATIVE mg/dL
Specific Gravity, Urine: 1.016 (ref 1.005–1.030)
pH: 8 (ref 5.0–8.0)

## 2019-09-09 LAB — APTT: aPTT: 27 seconds (ref 24–36)

## 2019-09-09 MED ORDER — VANCOMYCIN HCL IN DEXTROSE 1-5 GM/200ML-% IV SOLN
1000.0000 mg | Freq: Once | INTRAVENOUS | Status: AC
  Administered 2019-09-09: 1000 mg via INTRAVENOUS
  Filled 2019-09-09: qty 200

## 2019-09-09 MED ORDER — SODIUM CHLORIDE 0.9 % IV BOLUS (SEPSIS)
1000.0000 mL | Freq: Once | INTRAVENOUS | Status: AC
  Administered 2019-09-09: 21:00:00 1000 mL via INTRAVENOUS

## 2019-09-09 MED ORDER — SODIUM CHLORIDE 0.9 % IV SOLN
2.0000 g | Freq: Once | INTRAVENOUS | Status: AC
  Administered 2019-09-09: 2 g via INTRAVENOUS
  Filled 2019-09-09: qty 2

## 2019-09-09 MED ORDER — IOHEXOL 350 MG/ML SOLN
100.0000 mL | Freq: Once | INTRAVENOUS | Status: AC | PRN
  Administered 2019-09-10: 01:00:00 100 mL via INTRAVENOUS

## 2019-09-09 MED ORDER — SODIUM CHLORIDE 0.9 % IV BOLUS (SEPSIS)
1000.0000 mL | Freq: Once | INTRAVENOUS | Status: AC
  Administered 2019-09-10: 02:00:00 1000 mL via INTRAVENOUS

## 2019-09-09 MED ORDER — METRONIDAZOLE IN NACL 5-0.79 MG/ML-% IV SOLN
500.0000 mg | Freq: Once | INTRAVENOUS | Status: AC
  Administered 2019-09-09: 500 mg via INTRAVENOUS
  Filled 2019-09-09: qty 100

## 2019-09-09 NOTE — H&P (Signed)
LINDSY Rollins [id number] DOB: Mar 25, 1946 DOA: 09/09/2019     PCP: Nolene Ebbs, MD   Outpatient Specialists:   Lor[facility] Reid  Oncology   Dr. Jone Baseman, Dorris Fetch, MD as Consulting Physician (General Surgery)  Patient arrived to ER on 09/09/19 at [place]  Patient coming from: home Lives alone,       Chief Complaint:  Chief Complaint  Patient presents with  . Fever  . Chills    HPI: Julie Rollins is a 73 y.o. female with medical history significant of pancreatic cancer, breast cancer status post lumpectomy and radiation, hypertension, hypothyroidism    Presented with   fever chills fatigue started today suddenly at 1 PM occurred after patient hooked up her on TPN.  Currently not on active chemotherapy  Febrile at home up to 103 she did take some Tylenol but does not seem to have helped her much.  Reports back pain.  She had a bad reaction to chemotherapy and radiation recently and is being discontinued for now.  Patient reports runny nose and congestion generalized fatigue She denies any known sick contacts But she has been going to the doctor she lives at home alone  Infectious risk factors:  Reports  fever,    runny nose and fatigue In  ER RAPID COVID TEST in house testing  Pending  Lab Results  Component Value Date   [place] 07/14/2019   [place] NOT DETECTED 06/17/2019   [place] NEGATIVE 06/13/2019   [place] NEGATIVE 06/07/2019    Regarding pertinent Chronic problems:    Pancreatic cancer status post chemo and radiation hip therapy Plan for Whipple in November 2020 currently on TPN secondary to partial gastric outlet obstruction with obstructive jaundice status post ERCP needing stent   Hypothyroidism:  Lab Results  Component Value Date   TSH 3.371 08/17/2019   on synthroid       HTN on diltiazem denies hx of A.fib   While in ER:  ER provider initiated sepsis protocol start patient vancomycin and cefepime Blood cultures  obtained  The following Work up has been ordered so far:  Orders Placed This Encounter  Procedures  . Blood Culture (routine x 2)  . Urine culture  . SARS CORONAVIRUS 2 (TAT 6-24 HRS) Nasopharyngeal Nasopharyngeal Swab  . DG Chest Port 1 View  . Comprehensive metabolic panel  . CBC WITH DIFFERENTIAL  . APTT  . Protime-INR  . Urinalysis, Routine w reflex microscopic  . Lipase, blood  . Diet NPO time specified  . Cardiac monitoring  . Refer to Sidebar Report: Sepsis Sidebar ED/IP  . Document vital signs within 1-hour of fluid bolus completion and notify provider of bolus completion  . Document Actual / Estimated Weight  . Insert peripheral IV x 2  . Initiate Carrier Fluid Protocol  . Initiate Code Sepsis ([facility] [phone]) Reason for Consult? tracking  . Consult to hospitalist  ALL PATIENTS BEING ADMITTED/HAVING PROCEDURES NEED COVID-19 SCREENING  . Pulse oximetry, continuous  . ED EKG 12-Lead  . EKG 12-Lead    Following Medications were ordered in ER: Medications  sodium chloride 0.9 % bolus 1,000 mL (1,000 mLs Intravenous New Bag/Given 09/09/19 2110)    And  sodium chloride 0.9 % bolus 1,000 mL (has no administration in time range)  metroNIDAZOLE (FLAGYL) IVPB 500 mg ( Intravenous Rate/Dose Verify 09/09/19 2236)  vancomycin (VANCOCIN) IVPB 1000 mg/200 mL premix (has no administration in time range)  ceFEPIme (MAXIPIME) 2 g in sodium chloride 0.9 %  100 mL IVPB (0 g Intravenous Stopped 09/09/19 2145)        Consult Orders  (From admission, onward)         Start     Ordered   09/09/19 2215  Consult to hospitalist  ALL PATIENTS BEING ADMITTED/HAVING PROCEDURES NEED COVID-19 SCREENING  Once    Comments: ALL PATIENTS BEING ADMITTED/HAVING PROCEDURES NEED COVID-19 SCREENING  Provider:  (Not yet assigned)  Question Answer Comment  Place call to: [facility]   Reason for Consult Admit      09/09/19 2214           Significant initial  Findings: Abnormal  Labs Reviewed  COMPREHENSIVE METABOLIC PANEL - Abnormal; Notable for the following components:      Result Value   Glucose, Bld 154 (*)    BUN 36 (*)    All other components within normal limits  CBC WITH DIFFERENTIAL/PLATELET - Abnormal; Notable for the following components:   RBC 3.31 (*)    Hemoglobin 11.1 (*)    HCT 33.9 (*)    MCV 102.4 (*)    RDW 18.0 (*)    Neutro Abs 8.1 (*)    Lymphs Abs 0.3 (*)    All other components within normal limits  URINALYSIS, ROUTINE W REFLEX MICROSCOPIC - Abnormal; Notable for the following components:   APPearance CLOUDY (*)    Leukocytes,Ua SMALL (*)    All other components within normal limits    Otherwise labs showing:    Recent Labs  Lab 09/09/19 2050  NA 138  K 3.8  CO2 26  GLUCOSE 154*  BUN 36*  CREATININE 0.82  CALCIUM 9.8    Cr  stable,    Lab Results  Component Value Date   CREATININE 0.82 09/09/2019   CREATININE 0.94 08/17/2019   CREATININE 0.80 08/04/2019    Recent Labs  Lab 09/09/19 2050  AST 25  ALT 22  ALKPHOS 102  BILITOT 0.5  PROT 7.4  ALBUMIN 3.9   Lab Results  Component Value Date   CALCIUM 9.8 09/09/2019   PHOS 4.2 08/18/2019   WBC      Component Value Date/Time   WBC 9.1 09/09/2019 2050   ANC    Component Value Date/Time   NEUTROABS 8.1 (H) 09/09/2019 2050   ALC No components found for: LYMPHAB    Plt: Lab Results  Component Value Date   PLT 234 09/09/2019     Lactic Acid, Venous    Component Value Date/Time   LATICACIDVEN 1.3 09/09/2019 2050    Procalcitonin   Ordered   COVID-19 Labs  No results for input(s): DDIMER, FERRITIN, LDH, CRP in the last 72 hours.  Lab Results  Component Value Date   SARSCOV2NAA NEGATIVE 07/14/2019   SARSCOV2NAA NOT DETECTED 06/17/2019   SARSCOV2NAA NEGATIVE 06/13/2019   SARSCOV2NAA NEGATIVE 06/07/2019        HG/HCT   Stable     Component Value Date/Time   HGB 11.1 (L) 09/09/2019 2050   HGB 10.8 (L) 08/17/2019 0753   HCT 33.9  (L) 09/09/2019 2050    Recent Labs  Lab 09/09/19 2050  LIPASE 40   No results for input(s): AMMONIA in the last 168 hours.  No components found for: LABALBU    ECG: Ordered Personally reviewed by me showing: HR : 117 Rhythm:  Sinus tachycardia    secondary repol abnrm Anterior ST elevation QTC 465  BNP (last 3 results) No results for input(s): BNP in the last 8760  hours.  ProBNP (last 3 results) No results for input(s): PROBNP in the last 8760 hours.  DM  labs:  HbA1C: Recent Labs    03/28/19 0803  HGBA1C 4.8     CBG (last 3)  No results for input(s): GLUCAP in the last 72 hours.     UA   no evidence of UTI     Urine analysis:    Component Value Date/Time   COLORURINE YELLOW 09/09/2019 2039   APPEARANCEUR CLOUDY (A) 09/09/2019 2039   LABSPEC 1.016 09/09/2019 2039   PHURINE 8.0 09/09/2019 2039   GLUCOSEU NEGATIVE 09/09/2019 2039   HGBUR NEGATIVE 09/09/2019 2039   BILIRUBINUR NEGATIVE 09/09/2019 2039   KETONESUR NEGATIVE 09/09/2019 2039   PROTEINUR NEGATIVE 09/09/2019 2039   UROBILINOGEN 0.2 06/16/2014 0529   NITRITE NEGATIVE 09/09/2019 2039   LEUKOCYTESUR SMALL (A) 09/09/2019 2039      Ordered    CXR - NON acute     ED Triage Vitals  Enc Vitals Group     BP 09/09/19 2006 (!) 151/66     Pulse Rate 09/09/19 2006 (!) 121     Resp 09/09/19 2006 20     Temp 09/09/19 2006 100.1 F (37.8 C)     Temp Source 09/09/19 2006 Oral     SpO2 09/09/19 2006 97 %     Weight 09/09/19 2029 131 lb (59.4 kg)     Height 09/09/19 2029 5\' 4"  (1.626 m)     Head Circumference --      Peak Flow --      Pain Score 09/09/19 2029 0     Pain Loc --      Pain Edu? --      Excl. in [place]? --   TMAX(24)@       Latest  Blood pressure 140/66, pulse (!) 116, temperature 99.2 F (37.3 C), temperature source Oral, resp. rate 20, height 5\' 4"  (1.626 m), weight 59.4 kg, SpO2 98 %.     Hospitalist was called for admission for sepsis   Review of Systems:    Pertinent positives  include:  Fevers, chills, fatigue,  Constitutional:  No weight loss, night sweats, weight loss  HEENT:  No headaches, Difficulty swallowing,Tooth/dental problems,Sore throat,  No sneezing, itching, ear ache, nasal congestion, post nasal drip,  Cardio-vascular:  No chest pain, Orthopnea, PND, anasarca, dizziness, palpitations.no Bilateral lower extremity swelling  GI:  No heartburn, indigestion, abdominal pain, nausea, vomiting, diarrhea, change in bowel habits, loss of appetite, melena, blood in stool, hematemesis Resp:  no shortness of breath at rest. No dyspnea on exertion, No excess mucus, no productive cough, No non-productive cough, No coughing up of blood.No change in color of mucus.No wheezing. Skin:  no rash or lesions. No jaundice GU:  no dysuria, change in color of urine, no urgency or frequency. No straining to urinate.  No flank pain.  Musculoskeletal:  No joint pain or no joint swelling. No decreased range of motion. No back pain.  Psych:  No change in mood or affect. No depression or anxiety. No memory loss.  Neuro: no localizing neurological complaints, no tingling, no weakness, no double vision, no gait abnormality, no slurred speech, no confusion  All systems reviewed and apart from [place] all are negative  Past Medical History:   Past Medical History:  Diagnosis Date  . Anxiety   . Blood transfusion without reported diagnosis   . Breast CA ([place])    s/p lumpectomy and radiation 2000  . Breast cancer (  [place])   . Colitis    2007  . Dyspnea   . Dysrhythmia   . Family history of bladder cancer   . Family history of stomach cancer   . Family history of throat cancer   . Hernia, epigastric   . HTN (hypertension)    "mild hypertension", was on BP med years ago but it caused orthostatic hypotension and she has not been medicated since  . Hypothyroid   . Pancreatic cancer ([place]) dx'd 03/2019  . Personal history of breast cancer   . Personal history of radiation  therapy 2001  . Poor appetite 04/2019  . Vertigo   . Vertigo       Past Surgical History:  Procedure Laterality Date  . ABDOMINAL HYSTERECTOMY     46yrs ago  . ANTERIOR CERVICAL DECOMP/DISCECTOMY FUSION N/A 08/02/2018   Procedure: ANTERIOR CERVICAL DECOMPRESSION/DISCECTOMY FUSION CERVICAL FIVE- CERVICAL SIX;  Surgeon: Earnie Larsson, MD;  Location: [facility];  Service: Neurosurgery;  Laterality: N/A;  ANTERIOR CERVICAL DECOMPRESSION/DISCECTOMY FUSION CERVICAL FIVE- CERVICAL SIX  . BILIARY BRUSHING  03/29/2019   Procedure: BILIARY BRUSHING;  Surgeon: [name], MD;  Location: [facility] ENDOSCOPY;  Service: Endoscopy;;  . BILIARY STENT PLACEMENT  03/29/2019   Procedure: BILIARY STENT PLACEMENT;  Surgeon: [name], MD;  Location: [facility] ENDOSCOPY;  Service: Endoscopy;;  . BIOPSY  03/29/2019   Procedure: BIOPSY;  Surgeon: [name], MD;  Location: [facility] ENDOSCOPY;  Service: Endoscopy;;  . BREAST LUMPECTOMY     2000  . ERCP N/A 03/29/2019   Procedure: ENDOSCOPIC RETROGRADE CHOLANGIOPANCREATOGRAPHY (ERCP);  Surgeon: [name], MD;  Location: [facility] ENDOSCOPY;  Service: Endoscopy;  Laterality: N/A;  . ESOPHAGOGASTRODUODENOSCOPY N/A 06/17/2019   Procedure: ESOPHAGOGASTRODUODENOSCOPY (EGD);  Surgeon: Gatha Mayer, MD;  Location: [facility] ENDOSCOPY;  Service: Gastroenterology;  Laterality: N/A;  . ESOPHAGOGASTRODUODENOSCOPY (EGD) WITH PROPOFOL N/A 03/29/2019   Procedure: ESOPHAGOGASTRODUODENOSCOPY (EGD) WITH PROPOFOL;  Surgeon: [name], MD;  Location: [facility] ENDOSCOPY;  Service: Endoscopy;  Laterality: N/A;  . ESOPHAGOGASTRODUODENOSCOPY (EGD) WITH PROPOFOL N/A 06/09/2019   Procedure: ESOPHAGOGASTRODUODENOSCOPY (EGD) WITH PROPOFOL;  Surgeon: [name], MD;  Location: [facility] ENDOSCOPY;  Service: Endoscopy;  Laterality: N/A;  . EUS N/A 03/29/2019   Procedure: UPPER ENDOSCOPIC ULTRASOUND (EUS) LINEAR;  Surgeon: [name], MD;  Location: [facility] ENDOSCOPY;  Service: Endoscopy;  Laterality: N/A;  . EUS N/A  06/09/2019   Procedure: UPPER ENDOSCOPIC ULTRASOUND (EUS) LINEAR;  Surgeon: [name], MD;  Location: [facility] ENDOSCOPY;  Service: Endoscopy;  Laterality: N/A;  . FIDUCIAL MARKER PLACEMENT  06/09/2019   Procedure: FIDUCIAL MARKER PLACEMENT;  Surgeon: [name], MD;  Location: [facility] ENDOSCOPY;  Service: Endoscopy;;  . FINE NEEDLE ASPIRATION  03/29/2019   Procedure: FINE NEEDLE ASPIRATION (FNA);  Surgeon: [name], MD;  Location: [facility] ENDOSCOPY;  Service: Endoscopy;;  . HERNIA REPAIR     20+ years ago  . IR CV LINE INJECTION  08/23/2019  . PORTACATH PLACEMENT N/A 04/12/2019   Procedure: INSERTION PORT-A-CATH LEFT SUBCLAVIAN;  Surgeon: Stark Klein, MD;  Location: [place];  Service: General;  Laterality: N/A;  . RETINAL DETACHMENT SURGERY    . SPHINCTEROTOMY  03/29/2019   Procedure: SPHINCTEROTOMY;  Surgeon: [name], MD;  Location: [facility] ENDOSCOPY;  Service: Endoscopy;;    Social History:  Ambulatory   independently      reports that she has never smoked. She has never used smokeless tobacco. She reports that she does not drink  alcohol or use drugs.     Family History:   Family History  Problem Relation Age of Onset  . Bladder Cancer Brother   . Other Other        Denies family h/o cardiac disease  . Stomach cancer Maternal Uncle   . Throat cancer Maternal Uncle     Allergies: Allergies  Allergen Reactions  . Codeine Nausea Only     Prior to Admission medications   Medication Sig Start Date End Date Taking? Authorizing Provider  diltiazem (CARDIZEM CD) 180 MG 24 hr capsule Take 180 mg by mouth daily. 05/18/18  Yes [provider]  hydrocortisone (ANUSOL-HC) 2.5 % rectal cream Place rectally 2 (two) times daily. Patient taking differently: Place rectally daily as needed.  06/23/19  Yes Samuella Cota, MD  levothyroxine (SYNTHROID, LEVOTHROID) 50 MCG tablet Take 50 mcg by mouth every morning.   Yes [provider]  Multiple  Vitamin (MULTIVITAMIN WITH MINERALS) TABS tablet Take 1 tablet by mouth daily.   Yes [provider]  pantoprazole (PROTONIX) 40 MG tablet Take 1 tablet (40 mg total) by mouth 2 (two) times daily. Patient taking differently: Take 40 mg by mouth daily.  06/23/19 06/22/20 Yes Samuella Cota, MD  mirtazapine (REMERON) 7.5 MG tablet Take 1 tablet (7.5 mg total) by mouth at bedtime. Patient not taking: Reported on 09/09/2019 09/05/19   Truitt Merle, MD  olaparib [facility]) 100 MG tablet Take 1 tablet (100 mg total) by mouth 2 (two) times daily. Swallow whole. May take with food to decrease nausea and vomiting. Patient not taking: Reported on 09/09/2019 09/05/19   Truitt Merle, MD  ondansetron (ZOFRAN-ODT) 8 MG disintegrating tablet Take 1 tablet (8 mg total) by mouth every 8 (eight) hours as needed for nausea or vomiting. Patient not taking: Reported on [id number]   Delora Fuel, MD  potassium chloride 20 MEQ/15ML (10%) SOLN Take 15 mLs (20 mEq total) by mouth daily. Patient not taking: Reported on 06/01/2019 05/04/19 06/12/19  Alla Feeling, NP   Physical Exam: Blood pressure 140/66, pulse (!) 116, temperature 99.2 F (37.3 C), temperature source Oral, resp. rate 20, height 5\' 4"  (1.626 m), weight 59.4 kg, SpO2 98 %. 1. General:  in No Acute distress   Chronically ill -appearing 2. Psychological: Alert and  Oriented 3. Head/ENT:    Dry Mucous Membranes                          Head Non traumatic, neck supple                            Poor Dentition 4. SKIN: normal Skin turgor,  Skin clean Dry and intact no rash, port in place no evidence of infection  5. Heart: Regular rate and rhythm no  Murmur, no Rub or gallop 6. Lungs:   no wheezes or crackles   7. Abdomen: Soft,   on-tender, Non distended  bowel sounds present 8. Lower extremities: no clubbing, cyanosis, no  edema 9. Neurologically Grossly intact, moving all 4 extremities equally   10. MSK: Normal range of motion   All other  LABS:     Recent Labs  Lab 09/09/19 2050  WBC 9.1  NEUTROABS 8.1*  HGB 11.1*  HCT 33.9*  MCV 102.4*  PLT 234     Recent Labs  Lab 09/09/19 2050  NA 138  K 3.8  CL  102  CO2 26  GLUCOSE 154*  BUN 36*  CREATININE 0.82  CALCIUM 9.8     Recent Labs  Lab 09/09/19 2050  AST 25  ALT 22  ALKPHOS 102  BILITOT 0.5  PROT 7.4  ALBUMIN 3.9       Cultures:    Component Value Date/Time   SDES  07/16/2019 1503    BLOOD RIGHT ANTECUBITAL Performed at [facility], [place] [address] [place], [place] [address]    SDES  07/16/2019 1503    BLOOD RIGHT HAND Performed at [facility], [place] [address] [place], [place] [address]    SPECREQUEST  07/16/2019 1503    BOTTLES DRAWN AEROBIC ONLY Blood Culture adequate volume Performed at [place] [address] [place], [place] [address]    SPECREQUEST  07/16/2019 1503    BOTTLES DRAWN AEROBIC ONLY Blood Culture adequate volume Performed at [place] [address] [place], [place] [address]    CULT  07/16/2019 1503    NO GROWTH 5 DAYS Performed at [place] [address] [place], [place] [address]    CULT  07/16/2019 1503    NO GROWTH 5 DAYS Performed at [place] [address] [place],  [address]    REPTSTATUS 07/21/2019 FINAL 07/16/2019 1503   REPTSTATUS 07/21/2019 FINAL 07/16/2019 1503     Radiological Exams on Admission: Dg Chest Port 1 View  Result Date: 09/09/2019 CLINICAL DATA:  Fever and chills. EXAM: PORTABLE CHEST 1 VIEW COMPARISON:  July 17, 2019 FINDINGS: Stable left Port-A-Cath. No pneumothorax. The heart, hila, mediastinum, lungs, and pleura are unremarkable. IMPRESSION: No active disease. Electronically Signed   By: Dorise Bullion III M.D   On: 09/09/2019 20:58    Chart has been reviewed    Assessment/Plan  73 y.o. female with medical history significant of pancreatic cancer, breast  cancer status post lumpectomy and radiation, hypertension, hypothyroidism  Admitted for sepsis  Present on Admission: . Sepsis ([place]) -   -Patient meets sepsis criteria with  fever  Tachycardia   Initial lactic acid Lactic Acid, Venous    Component Value Date/Time   LATICACIDVEN 1.3 09/09/2019 2050   Source most likely: unknown but pt is on TPN   -We will rehydrate, treat with IV antibiotics, follow lactic acid - Await results of blood and urine culture and adjust antibiotics as needed - Obtain MRSA serologies  - Obtain respiratory panel   Covid testing is pending  influenza rapid testing is ordered  . Pancreatic mass -followed by oncology.  Plan was for Whipple's procedure in November.  Notified oncology patient has been admitted  . Hypothyroidism -- Check TSH continue home medications at current dose    . Partial gastric outlet obstruction -currently on TPN and liquid diet Will order nutritional consult  . Moderate protein-calorie malnutrition (HCC)-check prealbumin order nutritional consult to help with TPN management   Tachycardia -secondary to dehydration and/or sepsis.  Given history of malignancy will obtain CTA to rule out PE.  Also will help to evaluate if there is any underlying infiltrates  Other plan as per orders.  DVT prophylaxis:   Lovenox     Code Status:  FULL CODE  as per patient   I had personally discussed CODE STATUS with patient    Family Communication:   Family not at  Bedside    Disposition Plan:   To home once workup is complete and patient is  stable                      Would benefit from PT/OT eval prior to DC  Ordered                                    Nutrition    consulted                                       Consults called: email oncology that patient has been admitted  Admission status:  ED Disposition    None      Obs   Level of care  SDU     please discontinue once patient no longer qualifies  Precautions:  Airborne and  Contact precautions  PPE: Used by the provider:   P100  eye Goggles,  Gloves     Amberle Lyter Sherman 09/09/2019, 11:53 PM    [facility]     after 2 AM please page floor coverage PA If 7AM-7PM, please contact the day team taking care of the patient using [email or web address]

## 2019-09-09 NOTE — ED Notes (Signed)
Unable to obtain a second set of blood cultures from either pt's port or blood draw.

## 2019-09-09 NOTE — ED Triage Notes (Signed)
Pt bib EMS and coming from home.  Pt presents with fevers, chills, and fatigue that began at about 13:00 when pt hooked her TPN up to herself.  Hx pancreatic CA. Pt not currently getting chemo d/t a bad reaction to her last chemo tx.  Pt is to have surgery to help remove her cancer in early November. Pt a/o x 4.

## 2019-09-09 NOTE — Progress Notes (Signed)
A consult was received from an ED physician for vanc/cefepime per pharmacy dosing.  The patient's profile has been reviewed for ht/wt/allergies/indication/available labs.   A one time order has been placed for vanc 1g and cefepime 2g.  Further antibiotics/pharmacy consults should be ordered by admitting physician if indicated.                       Thank you, Kara Mead 09/09/2019  8:52 PM

## 2019-09-09 NOTE — ED Provider Notes (Signed)
[facility] DEPT Provider Note   CSN: [id number] Arrival date & time: 09/09/19  1956     History   Chief Complaint Chief Complaint  Patient presents with  . Fever  . Chills    HPI Julie Rollins is a 73 y.o. female hx of HTN, pancreatic cancer, here presenting with fever.  Patient states that she is on TPN since she is chronically malnutrition from her pancreatic cancer. She states that when she tried to her Her TPN around 1 PM today, she noticed that she had chills and fever to 103 .  She did take some Tylenol but was complaining of some back pain as well .  She states that she has been having some issues with the port but recent x-rays confirm placement. She had reaction to chemotherapy and had radiation recently.      The history is provided by the patient.    Past Medical History:  Diagnosis Date  . Anxiety   . Blood transfusion without reported diagnosis   . Breast CA ([place])    s/p lumpectomy and radiation 2000  . Breast cancer ([place])   . Colitis    2007  . Dyspnea   . Dysrhythmia   . Family history of bladder cancer   . Family history of stomach cancer   . Family history of throat cancer   . Hernia, epigastric   . HTN (hypertension)    "mild hypertension", was on BP med years ago but it caused orthostatic hypotension and she has not been medicated since  . Hypothyroid   . Pancreatic cancer ([place]) dx'd 03/2019  . Personal history of breast cancer   . Personal history of radiation therapy 2001  . Poor appetite 04/2019  . Vertigo   . Vertigo     Patient Active Problem List   Diagnosis Date Noted  . Sepsis ([place]) 09/09/2019  . Moderate protein-calorie malnutrition ([place]) 08/16/2019  . Sepsis due to undetermined organism ([place]) 07/14/2019  . Hyponatremia   . Fever   . Malnutrition of moderate degree 06/22/2019  . Encounter for nasogastric (NG) tube placement   . Partial gastric outlet obstruction   . Intractable nausea and vomiting  06/13/2019  . Genetic testing 06/08/2019  . BRCA2 gene mutation positive in female 06/08/2019  . Family history of bladder cancer   . Family history of stomach cancer   . Family history of throat cancer   . Personal history of breast cancer   . Port-A-Cath in place 04/27/2019  . Carcinoma of head of pancreas ([place]) 04/04/2019  . Pancreatic mass 03/28/2019  . Elevated LFTs 03/28/2019  . Hypothyroidism 03/28/2019  . Diarrhea 03/28/2019  . Generalized weakness   . Jaundice   . Abnormal finding on GI tract imaging   . Elevated alkaline phosphatase level   . Cervical spine fracture ([place]) 07/31/2018    Past Surgical History:  Procedure Laterality Date  . ABDOMINAL HYSTERECTOMY     52yr ago  . ANTERIOR CERVICAL DECOMP/DISCECTOMY FUSION N/A 08/02/2018   Procedure: ANTERIOR CERVICAL DECOMPRESSION/DISCECTOMY FUSION CERVICAL FIVE- CERVICAL SIX;  Surgeon: PEarnie Larsson MD;  [facility]  Service: Neurosurgery;  Laterality: N/A;  ANTERIOR CERVICAL DECOMPRESSION/DISCECTOMY FUSION CERVICAL FIVE- CERVICAL SIX  . BILIARY BRUSHING  03/29/2019   Procedure: BILIARY BRUSHING;  Surgeon: JMilus Banister MD;  [facility];  Service: Endoscopy;;  . BILIARY STENT PLACEMENT  03/29/2019   Procedure: BILIARY STENT PLACEMENT;  Surgeon: JMilus Banister MD;  [facility];  Service: Endoscopy;;  . BIOPSY  03/29/2019   Procedure: BIOPSY;  Surgeon: [name], MD;  Location: [facility] ENDOSCOPY;  Service: Endoscopy;;  . BREAST LUMPECTOMY     2000  . ERCP N/A 03/29/2019   Procedure: ENDOSCOPIC RETROGRADE CHOLANGIOPANCREATOGRAPHY (ERCP);  Surgeon: [name], MD;  Location: [facility] ENDOSCOPY;  Service: Endoscopy;  Laterality: N/A;  . ESOPHAGOGASTRODUODENOSCOPY N/A 06/17/2019   Procedure: ESOPHAGOGASTRODUODENOSCOPY (EGD);  Surgeon: Gatha Mayer, MD;  Location: [facility] END[facility];  Service: Gastroenterology;  Laterality: N/A;  . ESOPHAGOGASTRODUODENOSCOPY (EGD) WITH PROPOFOL N/A 03/29/2019   Procedure:  ESOPHAGOGASTRODUODENOSCOPY (EGD) WITH PROPOFOL;  Surgeon: [name], MD;  [facility];  Service: Endoscopy;  Laterality: N/A;  . ESOPHAGOGASTRODUODENOSCOPY (EGD) WITH PROPOFOL N/A 06/09/2019   Procedure: ESOPHAGOGASTRODUODENOSCOPY (EGD) WITH PROPOFOL;  Surgeon: [name], MD;  Location: [facility] ENDOSCOPY;  Service: Endoscopy;  Laterality: N/A;  . EUS N/A 03/29/2019   Procedure: UPPER ENDOSCOPIC ULTRASOUND (EUS) LINEAR;  Surgeon: [name], MD;  Location[facility] ENDOSCOPY;  Service: Endoscopy;  Laterality: N/A;  . EUS N/A 06/09/2019   Procedure: UPPER ENDOSCOPIC ULTRASOUND (EUS) LINEAR;  Surgeon: [name], MD;  Location: [facility] ENDOSCOPY;  Service: Endoscopy;  Laterality: N/A;  . FIDUCIAL MARKER PLACEMENT  06/09/2019   Procedure: FIDUCIAL MARKER PLACEMENT;  Surgeon: [name], MD;  Location: [facility] ENDOSCOPY;  Service: Endoscopy;;  . FINE NEEDLE ASPIRATION  03/29/2019   Procedure: FINE NEEDLE ASPIRATION (FNA);  Surgeon: [name], MD;  Location: [facility] ENDOSCOPY;  Service: Endoscopy;;  . HERNIA REPAIR     20+ years ago  . IR CV LINE INJECTION  08/23/2019  . PORTACATH PLACEMENT N/A 04/12/2019   Procedure: INSERTION PORT-A-CATH LEFT SUBCLAVIAN;  Surgeon: Stark Klein, MD;  Location: [facility];  Service: General;  Laterality: N/A;  . RETINAL DETACHMENT SURGERY    . SPHINCTEROTOMY  03/29/2019   Procedure: SPHINCTEROTOMY;  Surgeon: [name], MD;  Location[facility] END[facility]COPY;  Service: Endoscopy;;     OB History   No obstetric history on file.      Home Medications    Prior to Admission medications   Medication Sig Start Date End Date Taking? Authorizing Provider  diltiazem (CARDIZEM CD) 180 MG 24 hr capsule Take 180 mg by mouth daily. 05/18/18  Yes [provider]  hydrocortisone (ANUSOL-HC) 2.5 % rectal cream Place rectally 2 (two) times daily. Patient taking differently: Place rectally daily as needed.  06/23/19  Yes Samuella Cota, MD   levothyroxine (SYNTHROID, LEVOTHROID) 50 MCG tablet Take 50 mcg by mouth every morning.   Yes [provider]  Multiple Vitamin (MULTIVITAMIN WITH MINERALS) TABS tablet Take 1 tablet by mouth daily.   Yes [provider]  pantoprazole (PROTONIX) 40 MG tablet Take 1 tablet (40 mg total) by mouth 2 (two) times daily. Patient taking differently: Take 40 mg by mouth daily.  06/23/19 06/22/20 Yes Samuella Cota, MD  mirtazapine (REMERON) 7.5 MG tablet Take 1 tablet (7.5 mg total) by mouth at bedtime. Patient not taking: Reported on 09/09/2019 09/05/19   Truitt Merle, MD  olaparib [facility]) 100 MG tablet Take 1 tablet (100 mg total) by mouth 2 (two) times daily. Swallow whole. May take with food to decrease nausea and vomiting. Patient not taking: Reported on 09/09/2019 09/05/19   Truitt Merle, MD  ondansetron (ZOFRAN-ODT) 8 MG disintegrating tablet Take 1 tablet (8 mg total) by mouth every 8 (eight) hours as needed for nausea or vomiting. Patient  not taking: Reported on 3/82/5053 9/76/73   Delora Fuel, MD  potassium chloride 20 MEQ/15ML (10%) SOLN Take 15 mLs (20 mEq total) by mouth daily. Patient not taking: Reported on 06/01/2019 05/04/19 06/12/19  Alla Feeling, NP    Family History Family History  Problem Relation Age of Onset  . Bladder Cancer Brother   . Other Other        Denies family h/o cardiac disease  . Stomach cancer Maternal Uncle   . Throat cancer Maternal Uncle     Social History Social History   Tobacco Use  . Smoking status: Never Smoker  . Smokeless tobacco: Never Used  Substance Use Topics  . Alcohol use: No  . Drug use: No     Allergies   Codeine   Review of Systems Review of Systems  Constitutional: Positive for fever.  All other systems reviewed and are negative.    Physical Exam Updated Vital Signs BP 140/66   Pulse (!) 116   Temp 99.2 F (37.3 C) (Oral)   Resp 20   Ht '5\' 4"'$  (1.626 m)   Wt 59.4 kg   SpO2 98%   BMI 22.49 kg/m    Physical Exam Vitals signs reviewed.  Constitutional:      Comments: Chronically ill   HENT:     Head: Normocephalic.     Nose: Nose normal.     Mouth/Throat:     Mouth: Mucous membranes are moist.  Eyes:     Extraocular Movements: Extraocular movements intact.     Pupils: Pupils are equal, round, and reactive to light.  Neck:     Musculoskeletal: Normal range of motion.  Cardiovascular:     Rate and Rhythm: Regular rhythm. Tachycardia present.  Pulmonary:     Effort: Pulmonary effort is normal.     Breath sounds: Normal breath sounds.     Comments: L chest wall port with no obvious signs of infection  Abdominal:     General: Abdomen is flat.     Palpations: Abdomen is soft.     Comments: Firm, + epigastric tenderness   Skin:    General: Skin is warm.     Capillary Refill: Capillary refill takes less than 2 seconds.  Neurological:     General: No focal deficit present.     Mental Status: She is alert.  Psychiatric:        Mood and Affect: Mood normal.        Behavior: Behavior normal.      ED Treatments / Results  Labs (all labs ordered are listed, but only abnormal results are displayed) Labs Reviewed  COMPREHENSIVE METABOLIC PANEL - Abnormal; Notable for the following components:      Result Value   Glucose, Bld 154 (*)    BUN 36 (*)    All other components within normal limits  CBC WITH DIFFERENTIAL/PLATELET - Abnormal; Notable for the following components:   RBC 3.31 (*)    Hemoglobin 11.1 (*)    HCT 33.9 (*)    MCV 102.4 (*)    RDW 18.0 (*)    Neutro Abs 8.1 (*)    Lymphs Abs 0.3 (*)    All other components within normal limits  URINALYSIS, ROUTINE W REFLEX MICROSCOPIC - Abnormal; Notable for the following components:   APPearance CLOUDY (*)    Leukocytes,Ua SMALL (*)    All other components within normal limits  CULTURE, BLOOD (ROUTINE X 2)  CULTURE, BLOOD (ROUTINE X 2)  URINE CULTURE  SARS CORONAVIRUS 2 (TAT 6-24 HRS)  RESPIRATORY PANEL BY PCR   LACTIC ACID, PLASMA  APTT  PROTIME-INR  LIPASE, BLOOD  INFLUENZA PANEL BY PCR (TYPE A & B)    EKG EKG Interpretation  Date/Time:  Friday September 09 2019 20:32:32 EDT Ventricular Rate:  117 PR Interval:    QRS Duration: 118 QT Interval:  333 QTC Calculation: 465 R Axis:   27 Text Interpretation:  Sinus tachycardia Incomplete left bundle branch block Probable LVH with secondary repol abnrm Anterior ST elevation, probably due to LVH No significant change since last tracing Confirmed by Wandra Arthurs ([id number]) on 09/09/2019 9:15:56 PM   Radiology Dg Chest Port 1 View  Result Date: 09/09/2019 CLINICAL DATA:  Fever and chills. EXAM: PORTABLE CHEST 1 VIEW COMPARISON:  July 17, 2019 FINDINGS: Stable left Port-A-Cath. No pneumothorax. The heart, hila, mediastinum, lungs, and pleura are unremarkable. IMPRESSION: No active disease. Electronically Signed   By: Dorise Bullion III M.D   On: 09/09/2019 20:58    Procedures Procedures (including critical care time)  Medications Ordered in ED Medications  sodium chloride 0.9 % bolus 1,000 mL (1,000 mLs Intravenous New Bag/Given 09/09/19 2110)    And  sodium chloride 0.9 % bolus 1,000 mL (has no administration in time range)  vancomycin (VANCOCIN) IVPB 1000 mg/200 mL premix (1,000 mg Intravenous New Bag/Given 09/09/19 2326)  ceFEPIme (MAXIPIME) 2 g in sodium chloride 0.9 % 100 mL IVPB (0 g Intravenous Stopped 09/09/19 2145)  metroNIDAZOLE (FLAGYL) IVPB 500 mg (0 mg Intravenous Stopped 09/09/19 2322)     Initial Impression / Assessment and Plan / ED Course  I have reviewed the triage vital signs and the nursing notes.  Pertinent labs & imaging results that were available during my care of the patient were reviewed by me and considered in my medical decision making (see chart for details).       TYYNE CLIETT is a 73 y.o. female here with fever. Patient has pancreatic cancer and is on TPN. Her port site doesn't appear infected. She  recently had radiation. She has low grade temp and is tachycardic. Will initiate code sepsis and give empiric abx and IVF.   10:30 PM WBC nl, lactate nl. CXR showed PICC line in place. UA normal. Given that she was febrile 103 and is immunocompromised, will admit until cultures return. COVID sent.     Final Clinical Impressions(s) / ED Diagnoses   Final diagnoses:  Fever, unspecified fever cause    ED Discharge Orders    None       Drenda Freeze, MD 09/09/19 2335

## 2019-09-10 ENCOUNTER — Encounter (HOSPITAL_COMMUNITY): Payer: Self-pay

## 2019-09-10 ENCOUNTER — Observation Stay (HOSPITAL_COMMUNITY): Payer: Medicare HMO

## 2019-09-10 DIAGNOSIS — R7881 Bacteremia: Secondary | ICD-10-CM | POA: Diagnosis not present

## 2019-09-10 DIAGNOSIS — Z9221 Personal history of antineoplastic chemotherapy: Secondary | ICD-10-CM | POA: Diagnosis not present

## 2019-09-10 DIAGNOSIS — Y848 Other medical procedures as the cause of abnormal reaction of the patient, or of later complication, without mention of misadventure at the time of the procedure: Secondary | ICD-10-CM | POA: Diagnosis present

## 2019-09-10 DIAGNOSIS — A411 Sepsis due to other specified staphylococcus: Secondary | ICD-10-CM | POA: Diagnosis not present

## 2019-09-10 DIAGNOSIS — Z90411 Acquired partial absence of pancreas: Secondary | ICD-10-CM | POA: Diagnosis not present

## 2019-09-10 DIAGNOSIS — Z95828 Presence of other vascular implants and grafts: Secondary | ICD-10-CM | POA: Diagnosis not present

## 2019-09-10 DIAGNOSIS — I361 Nonrheumatic tricuspid (valve) insufficiency: Secondary | ICD-10-CM | POA: Diagnosis not present

## 2019-09-10 DIAGNOSIS — Z6823 Body mass index (BMI) 23.0-23.9, adult: Secondary | ICD-10-CM | POA: Diagnosis not present

## 2019-09-10 DIAGNOSIS — D6481 Anemia due to antineoplastic chemotherapy: Secondary | ICD-10-CM | POA: Diagnosis present

## 2019-09-10 DIAGNOSIS — T80211A Bloodstream infection due to central venous catheter, initial encounter: Secondary | ICD-10-CM | POA: Diagnosis not present

## 2019-09-10 DIAGNOSIS — D8481 Immunodeficiency due to conditions classified elsewhere: Secondary | ICD-10-CM | POA: Diagnosis not present

## 2019-09-10 DIAGNOSIS — Z79890 Hormone replacement therapy (postmenopausal): Secondary | ICD-10-CM | POA: Diagnosis not present

## 2019-09-10 DIAGNOSIS — I10 Essential (primary) hypertension: Secondary | ICD-10-CM | POA: Diagnosis present

## 2019-09-10 DIAGNOSIS — I447 Left bundle-branch block, unspecified: Secondary | ICD-10-CM | POA: Diagnosis not present

## 2019-09-10 DIAGNOSIS — H9201 Otalgia, right ear: Secondary | ICD-10-CM | POA: Diagnosis present

## 2019-09-10 DIAGNOSIS — E440 Moderate protein-calorie malnutrition: Secondary | ICD-10-CM | POA: Diagnosis not present

## 2019-09-10 DIAGNOSIS — E039 Hypothyroidism, unspecified: Secondary | ICD-10-CM | POA: Diagnosis present

## 2019-09-10 DIAGNOSIS — H9319 Tinnitus, unspecified ear: Secondary | ICD-10-CM | POA: Diagnosis present

## 2019-09-10 DIAGNOSIS — E46 Unspecified protein-calorie malnutrition: Secondary | ICD-10-CM | POA: Diagnosis not present

## 2019-09-10 DIAGNOSIS — R509 Fever, unspecified: Secondary | ICD-10-CM | POA: Diagnosis not present

## 2019-09-10 DIAGNOSIS — Z8507 Personal history of malignant neoplasm of pancreas: Secondary | ICD-10-CM | POA: Diagnosis not present

## 2019-09-10 DIAGNOSIS — A4102 Sepsis due to Methicillin resistant Staphylococcus aureus: Secondary | ICD-10-CM | POA: Diagnosis not present

## 2019-09-10 DIAGNOSIS — K311 Adult hypertrophic pyloric stenosis: Secondary | ICD-10-CM | POA: Diagnosis not present

## 2019-09-10 DIAGNOSIS — T451X5A Adverse effect of antineoplastic and immunosuppressive drugs, initial encounter: Secondary | ICD-10-CM | POA: Diagnosis not present

## 2019-09-10 DIAGNOSIS — K8689 Other specified diseases of pancreas: Secondary | ICD-10-CM | POA: Diagnosis not present

## 2019-09-10 DIAGNOSIS — A419 Sepsis, unspecified organism: Secondary | ICD-10-CM | POA: Diagnosis present

## 2019-09-10 DIAGNOSIS — H9190 Unspecified hearing loss, unspecified ear: Secondary | ICD-10-CM | POA: Diagnosis present

## 2019-09-10 DIAGNOSIS — C250 Malignant neoplasm of head of pancreas: Secondary | ICD-10-CM | POA: Diagnosis not present

## 2019-09-10 DIAGNOSIS — F419 Anxiety disorder, unspecified: Secondary | ICD-10-CM | POA: Diagnosis present

## 2019-09-10 DIAGNOSIS — D701 Agranulocytosis secondary to cancer chemotherapy: Secondary | ICD-10-CM | POA: Diagnosis present

## 2019-09-10 DIAGNOSIS — B9562 Methicillin resistant Staphylococcus aureus infection as the cause of diseases classified elsewhere: Secondary | ICD-10-CM | POA: Diagnosis not present

## 2019-09-10 DIAGNOSIS — I340 Nonrheumatic mitral (valve) insufficiency: Secondary | ICD-10-CM | POA: Diagnosis not present

## 2019-09-10 DIAGNOSIS — Z853 Personal history of malignant neoplasm of breast: Secondary | ICD-10-CM | POA: Diagnosis not present

## 2019-09-10 DIAGNOSIS — K831 Obstruction of bile duct: Secondary | ICD-10-CM | POA: Diagnosis not present

## 2019-09-10 DIAGNOSIS — Z923 Personal history of irradiation: Secondary | ICD-10-CM | POA: Diagnosis not present

## 2019-09-10 DIAGNOSIS — Z20828 Contact with and (suspected) exposure to other viral communicable diseases: Secondary | ICD-10-CM | POA: Diagnosis not present

## 2019-09-10 DIAGNOSIS — R112 Nausea with vomiting, unspecified: Secondary | ICD-10-CM | POA: Diagnosis not present

## 2019-09-10 LAB — COMPREHENSIVE METABOLIC PANEL
ALT: 22 U/L (ref 0–44)
AST: 24 U/L (ref 15–41)
Albumin: 3 g/dL — ABNORMAL LOW (ref 3.5–5.0)
Alkaline Phosphatase: 84 U/L (ref 38–126)
Anion gap: 9 (ref 5–15)
BUN: 28 mg/dL — ABNORMAL HIGH (ref 8–23)
CO2: 23 mmol/L (ref 22–32)
Calcium: 8.4 mg/dL — ABNORMAL LOW (ref 8.9–10.3)
Chloride: 106 mmol/L (ref 98–111)
Creatinine, Ser: 0.69 mg/dL (ref 0.44–1.00)
GFR calc Af Amer: >60 mL/min (ref >60)
GFR calc non Af Amer: >60 mL/min (ref >60)
Glucose, Bld: 160 mg/dL — ABNORMAL HIGH (ref 70–99)
Potassium: 3.6 mmol/L (ref 3.5–5.1)
Sodium: 138 mmol/L (ref 135–145)
Total Bilirubin: 0.5 mg/dL (ref 0.3–1.2)
Total Protein: 5.9 g/dL — ABNORMAL LOW (ref 6.5–8.1)

## 2019-09-10 LAB — MAGNESIUM
Magnesium: 2.1 mg/dL (ref 1.7–2.4)
Magnesium: 1.9 mg/dL (ref 1.7–2.4)

## 2019-09-10 LAB — RESPIRATORY PANEL BY PCR

## 2019-09-10 LAB — LACTIC ACID, PLASMA: Lactic Acid, Venous: 1.2 mmol/L (ref 0.5–1.9)

## 2019-09-10 LAB — CBC
HCT: 31.1 % — ABNORMAL LOW (ref 36.0–46.0)
Hemoglobin: 9.8 g/dL — ABNORMAL LOW (ref 12.0–15.0)
MCH: 32.6 pg (ref 26.0–34.0)
MCHC: 31.5 g/dL (ref 30.0–36.0)
MCV: 103.3 fL — ABNORMAL HIGH (ref 80.0–100.0)
Platelets: 192 10*3/uL (ref 150–400)
RBC: 3.01 MIL/uL — ABNORMAL LOW (ref 3.87–5.11)
RDW: 17.8 % — ABNORMAL HIGH (ref 11.5–15.5)
WBC: 6.9 10*3/uL (ref 4.0–10.5)
nRBC: 0 % (ref 0.0–0.2)

## 2019-09-10 LAB — GLUCOSE, CAPILLARY
Glucose-Capillary: 128 mg/dL — ABNORMAL HIGH (ref 70–99)
Glucose-Capillary: 143 mg/dL — ABNORMAL HIGH (ref 70–99)
Glucose-Capillary: 147 mg/dL — ABNORMAL HIGH (ref 70–99)
Glucose-Capillary: 112 mg/dL — ABNORMAL HIGH (ref 70–99)

## 2019-09-10 LAB — TSH: TSH: 1.265 u[IU]/mL (ref 0.350–4.500)

## 2019-09-10 LAB — PHOSPHORUS
Phosphorus: 3.7 mg/dL (ref 2.5–4.6)
Phosphorus: 2.9 mg/dL (ref 2.5–4.6)

## 2019-09-10 LAB — INFLUENZA PANEL BY PCR (TYPE A & B)
Influenza A By PCR: NEGATIVE
Influenza B By PCR: NEGATIVE

## 2019-09-10 LAB — MRSA PCR SCREENING: MRSA by PCR: NEGATIVE

## 2019-09-10 LAB — PREALBUMIN: Prealbumin: 23.1 mg/dL (ref 18–38)

## 2019-09-10 LAB — SARS CORONAVIRUS 2 (TAT 6-24 HRS): SARS Coronavirus 2: NEGATIVE

## 2019-09-10 LAB — PROCALCITONIN: Procalcitonin: 0.21 ng/mL

## 2019-09-10 MED ORDER — CHLORHEXIDINE GLUCONATE CLOTH 2 % EX PADS
6.0000 | MEDICATED_PAD | Freq: Every day | CUTANEOUS | Status: DC
  Administered 2019-09-10 – 2019-09-17 (×7): 6 via TOPICAL

## 2019-09-10 MED ORDER — SODIUM CHLORIDE 0.9% FLUSH: 10.0000 mL | INTRAVENOUS | Status: DC | PRN

## 2019-09-10 MED ORDER — SODIUM CHLORIDE 0.9 % IV SOLN
INTRAVENOUS | Status: AC
  Administered 2019-09-10: 03:00:00 via INTRAVENOUS

## 2019-09-10 MED ORDER — SODIUM CHLORIDE 0.9 % IV BOLUS
500.0000 mL | Freq: Once | INTRAVENOUS | Status: AC
  Administered 2019-09-10: 500 mL via INTRAVENOUS

## 2019-09-10 MED ORDER — SODIUM CHLORIDE 0.9% FLUSH
10.0000 mL | Freq: Two times a day (BID) | INTRAVENOUS | Status: DC
  Administered 2019-09-10 – 2019-09-15 (×10): 10 mL

## 2019-09-10 MED ORDER — ACETAMINOPHEN 325 MG PO TABS
650.0000 mg | ORAL_TABLET | Freq: Four times a day (QID) | ORAL | Status: DC | PRN
  Administered 2019-09-10 (×4): 650 mg via ORAL
  Filled 2019-09-10 (×5): qty 2

## 2019-09-10 MED ORDER — ACETAMINOPHEN 650 MG RE SUPP: 650.0000 mg | Freq: Four times a day (QID) | RECTAL | Status: DC | PRN

## 2019-09-10 MED ORDER — SODIUM CHLORIDE 0.9 % IV SOLN
2.0000 g | Freq: Two times a day (BID) | INTRAVENOUS | Status: DC
  Administered 2019-09-10 – 2019-09-12 (×5): 2 g via INTRAVENOUS
  Filled 2019-09-10 (×5): qty 2

## 2019-09-10 MED ORDER — LEVOTHYROXINE SODIUM 50 MCG PO TABS
50.0000 ug | ORAL_TABLET | Freq: Every day | ORAL | Status: DC
  Administered 2019-09-10 – 2019-09-17 (×7): 50 ug via ORAL
  Filled 2019-09-10 (×8): qty 1

## 2019-09-10 MED ORDER — HYDROCODONE-ACETAMINOPHEN 5-325 MG PO TABS
1.0000 | ORAL_TABLET | ORAL | Status: DC | PRN
  Administered 2019-09-11 (×2): 1 via ORAL
  Administered 2019-09-14: 2 via ORAL
  Filled 2019-09-10 (×3): qty 1
  Filled 2019-09-10: qty 2

## 2019-09-10 MED ORDER — PANTOPRAZOLE SODIUM 40 MG PO TBEC
40.0000 mg | DELAYED_RELEASE_TABLET | Freq: Every day | ORAL | Status: DC
  Administered 2019-09-10 – 2019-09-17 (×7): 40 mg via ORAL
  Filled 2019-09-10 (×7): qty 1

## 2019-09-10 MED ORDER — METRONIDAZOLE IN NACL 5-0.79 MG/ML-% IV SOLN
500.0000 mg | Freq: Three times a day (TID) | INTRAVENOUS | Status: DC
  Administered 2019-09-10 – 2019-09-12 (×7): 500 mg via INTRAVENOUS
  Filled 2019-09-10 (×7): qty 100

## 2019-09-10 MED ORDER — INSULIN ASPART 100 UNIT/ML ~~LOC~~ SOLN
0.0000 [IU] | Freq: Four times a day (QID) | SUBCUTANEOUS | Status: DC
  Administered 2019-09-10 (×2): 1 [IU] via SUBCUTANEOUS
  Administered 2019-09-11: 2 [IU] via SUBCUTANEOUS

## 2019-09-10 MED ORDER — ENOXAPARIN SODIUM 40 MG/0.4ML ~~LOC~~ SOLN
40.0000 mg | Freq: Every day | SUBCUTANEOUS | Status: DC
  Administered 2019-09-10 – 2019-09-17 (×7): 40 mg via SUBCUTANEOUS
  Filled 2019-09-10 (×7): qty 0.4

## 2019-09-10 MED ORDER — ONDANSETRON HCL 4 MG PO TABS: 4.0000 mg | ORAL_TABLET | Freq: Four times a day (QID) | ORAL | Status: DC | PRN

## 2019-09-10 MED ORDER — ORAL CARE MOUTH RINSE
15.0000 mL | Freq: Two times a day (BID) | OROMUCOSAL | Status: DC
  Administered 2019-09-10 – 2019-09-17 (×13): 15 mL via OROMUCOSAL

## 2019-09-10 MED ORDER — ONDANSETRON HCL 4 MG/2ML IJ SOLN
4.0000 mg | Freq: Four times a day (QID) | INTRAMUSCULAR | Status: DC | PRN
  Administered 2019-09-10 – 2019-09-13 (×3): 4 mg via INTRAVENOUS
  Filled 2019-09-10 (×2): qty 2

## 2019-09-10 MED ORDER — VANCOMYCIN HCL IN DEXTROSE 1-5 GM/200ML-% IV SOLN
1000.0000 mg | INTRAVENOUS | Status: DC
  Administered 2019-09-10 – 2019-09-15 (×6): 1000 mg via INTRAVENOUS
  Filled 2019-09-10 (×6): qty 200

## 2019-09-10 MED ORDER — TRAVASOL 10 % IV SOLN
INTRAVENOUS | Status: AC
  Administered 2019-09-10: 18:00:00 via INTRAVENOUS
  Filled 2019-09-10: qty 1050

## 2019-09-10 NOTE — Plan of Care (Signed)
RN will continue to monitor 

## 2019-09-10 NOTE — Progress Notes (Signed)
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  She is scheduled for Whipple surgery on 10/02/2019 and plan is to continue TPN for nutrition until procedure.  She presented to the ED on 10/16 with c/o fever and chills.  TPN resumed on admission.  Significant events:  10/17: patient's home TPN bag currently running; she stated that she hung bag at 1PM on 10/16  Today:   Glucose (goal <150): 154-160 from bmet  Electrolytes:Na, phos, CorrCa wnl; K 3.6, Mag 1.9; CL and CO2 wnl  Renal: scr ok  LFTs:wnl  TGs: pending  Prealbumin: 23.1 (10/17)  NUTRITIONAL GOALS                                                                                             RD recs: pending   [facility] TPN 3-in-1 formulation: - 18 hr cyclic TPN (total TPN volume 1500 mL) - AA= 104 gm - dextrose= 100 gm - lipid = 45 gm - Total kcal/day = 1256 kcal - lytes in bag: calcium gluconate 9.6 meq, magnesium sulfate 8 meq, potassium acetate 98 mew, potassium phosphate 15 mmol, sodium acetate 77 meq - trace elements and MVI  Inpatient Custom 18 hr cyclic TPN with 99991111 ml volume provides: -  105 g/day protein (  70 g/L) -  45 g/day Lipid  ( 30  G/L) --> 37% of total TPN kcal - 105  g/day Dextrose (7 %) -  1227 Kcal/day  PLAN  At 1800 today:  18 ht cyclic TPN: 44 ml/hr first and last hour, 88 ml/hr for 16 hrs  Electrolytes in TPN: Standard except K 80 meq/L and phos 10 meq/L, Cl:Ac ratio -- max acetate  Plan to advance as tolerated to the goal rate.  TPN to contain standard multivitamins and trace elements.  NS at 100 ml/hr-- per MD  Add sensitive SSI q6h  TPN lab panels on Mondays & Thursdays.  F/u daily.   Amato Sevillano P 09/10/2019,8:27 AM

## 2019-09-10 NOTE — Progress Notes (Signed)
PT Cancellation Note  Patient Details Name: Julie GRANADE MRN: [id number] DOB: [birth year]   Cancelled Treatment:    Reason Eval/Treat Not Completed: Fatigue/lethargy limiting ability to participate(pt stated she didn't get much sleep last night and wants to rest right now. Will follow.)  Philomena Doheny PT 09/10/2019  Acute Rehabilitation Services Pager [phone] Office [phone]

## 2019-09-10 NOTE — Progress Notes (Signed)
PROGRESS NOTE    Julie Rollins  [id number] DOB: 06-18-1946 DOA: 09/09/2019 PCP: Julie Ebbs, MD   Brief Narrative: Julie Rollins is a 73 y.o. female with medical history significant of pancreatic cancer, breast cancer status post lumpectomy and radiation, hypertension, hypothyroidism. Patient presented    Assessment & Plan:   Active Problems:   Generalized weakness   Pancreatic mass   Hypothyroidism   Port-A-Cath in place   Partial gastric outlet obstruction   Moderate protein-calorie malnutrition ([place])   Sepsis ([place])   Sepsis Present on admission. No clear source, but blood culture positive for 1/4 GPC. Patient with mild fever today -Continue Vancomycin, Cefepime, Flagyl -Follow blood cultures/urine culture  Pancreatic mass Plan for Whipple procedure in November. Follows with oncology  Hypothyroidism Normal TSH -Continue Synthroid  Partial gastric outlet obstruction -Continue TPN/liquid diet  Tachycardia Likely secondary to presumed sepsis. Improved. Chest CTA negative for PE  Moderate malnutrition -TPN -Dietitian consulted   DVT prophylaxis: Lovenox Code Status:   Code Status: Full Code Family Communication: None at bedside Disposition Plan: Discharge pending sepsis workup   Consultants:   None  Procedures:   None  Antimicrobials:  Vancomycin  Cefepime  Flagyl    Subjective: No issues this morning. No complaints.  Objective: Vitals:   09/10/19 1100 09/10/19 1203 09/10/19 1219 09/10/19 1230  BP: (!) 138/51 (!) 114/47    Pulse: (!) 106 (!) 107 (!) 104 (!) 101  Resp: (!) 27 (!) 32 20 19  Temp:  (!) 100.6 F (38.1 C) 98.6 F (37 C)   TempSrc:  Oral Oral   SpO2: 95% 96% 96% 96%  Weight:      Height:        Intake/Output Summary (Last 24 hours) at 09/10/2019 1357 Last data filed at 09/10/2019 1133 Gross per 24 hour  Intake 2101.41 ml  Output 200 ml  Net 1901.41 ml   Filed Weights   09/09/19 2029 09/10/19 0925   Weight: 59.4 kg 63.2 kg    Examination:  General exam: Appears calm and comfortable Respiratory system: Clear to auscultation. Respiratory effort normal. Cardiovascular system: S1 & S2 heard, RRR. No murmurs, rubs, gallops or clicks. Gastrointestinal system: Abdomen is nondistended, soft and nontender. No organomegaly or masses felt. Normal bowel sounds heard. Central nervous system: Alert and oriented. No focal neurological deficits. Extremities: No edema. No calf tenderness Skin: No cyanosis. No rashes. Skin over port appears normal without erythema Psychiatry: Judgement and insight appear normal. Mood & affect appropriate.     Data Reviewed: I have personally reviewed following labs and imaging studies  CBC: Recent Labs  Lab 09/09/19 2050 09/10/19 0428  WBC 9.1 6.9  NEUTROABS 8.1*  --   HGB 11.1* 9.8*  HCT 33.9* 31.1*  MCV 102.4* 103.3*  PLT 234 [id number]   Basic Metabolic Panel: Recent Labs  Lab 09/09/19 2050 09/10/19 0428  NA 138 138  K 3.8 3.6  CL 102 106  CO2 26 23  GLUCOSE 154* 160*  BUN 36* 28*  CREATININE 0.82 0.69  CALCIUM 9.8 8.4*  MG 2.1 1.9  PHOS 3.7 2.9   GFR: Estimated Creatinine Clearance: 52.5 mL/min (by C-G formula based on SCr of 0.69 mg/dL). Liver Function Tests: Recent Labs  Lab 09/09/19 2050 09/10/19 0428  AST 25 24  ALT 22 22  ALKPHOS 102 84  BILITOT 0.5 0.5  PROT 7.4 5.9*  ALBUMIN 3.9 3.0*   Recent Labs  Lab 09/09/19 2050  LIPASE 40   No  results for input(s): AMMONIA in the last 168 hours. Coagulation Profile: Recent Labs  Lab 09/09/19 2050  INR 1.0   Cardiac Enzymes: No results for input(s): CKTOTAL, CKMB, CKMBINDEX, TROPONINI in the last 168 hours. BNP (last 3 results) No results for input(s): PROBNP in the last 8760 hours. HbA1C: No results for input(s): HGBA1C in the last 72 hours. CBG: No results for input(s): GLUCAP in the last 168 hours. Lipid Profile: No results for input(s): CHOL, HDL, LDLCALC, TRIG,  CHOLHDL, LDLDIRECT in the last 72 hours. Thyroid Function Tests: Recent Labs    09/10/19 0428  TSH 1.265   Anemia Panel: No results for input(s): VITAMINB12, FOLATE, FERRITIN, TIBC, IRON, RETICCTPCT in the last 72 hours. Sepsis Labs: Recent Labs  Lab 09/09/19 2050 09/10/19 0428  PROCALCITON  --  0.21  LATICACIDVEN 1.3 1.2    Recent Results (from the past 240 hour(s))  Blood Culture (routine x 2)     Status: None (Preliminary result)   Collection Time: 09/09/19  8:28 PM   Specimen: BLOOD  Result Value Ref Range Status   Specimen Description   Final    BLOOD LEFT ANTECUBITAL Performed at [facility], [place] [address], [place], [place] [address]    Special Requests   Final    BOTTLES DRAWN AEROBIC AND ANAEROBIC Blood Culture results may not be optimal due to an excessive volume of blood received in culture bottles Performed at [facility] [address] [place], [place] [address]    Culture  Setup Time   Final    GRAM POSITIVE COCCI ANAEROBIC BOTTLE ONLY CRITICAL RESULT CALLED TO, READ BACK BY AND VERIFIED WITH: PHARMD A PHAM [id number] AT 76 BY CM    Culture   Final    CULTURE REINCUBATED FOR BETTER GROWTH Performed at [facility], [place] [address], [place], [place] [address]    Report Status PENDING  Incomplete  SARS CORONAVIRUS 2 (TAT 6-24 HRS) Nasopharyngeal Nasopharyngeal Swab     Status: None   Collection Time: 09/09/19  8:53 PM   Specimen: Nasopharyngeal Swab  Result Value Ref Range Status   SARS Coronavirus 2 NEGATIVE NEGATIVE Final    Comment: (NOTE) SARS-CoV-2 target nucleic acids are NOT DETECTED. The SARS-CoV-2 RNA is generally detectable in upper and lower respiratory specimens during the acute phase of infection. Negative results do not preclude SARS-CoV-2 infection, do not rule out co-infections with other pathogens, and should not be used as the sole basis for treatment or other patient management decisions. Negative  results must be combined with clinical observations, patient history, and epidemiological information. The expected result is Negative. Fact Sheet for Patients: SugarRoll.be Fact Sheet for Healthcare Providers: https://www.woods-mathews.com/ This test is not yet approved or cleared by the [place] FDA and  has been authorized for detection and/or diagnosis of SARS-CoV-2 by FDA under an Emergency Use Authorization (EUA). This EUA will remain  in effect (meaning this test can be used) for the duration of the COVID-19 declaration under Section 56 4(b)(1) of the Act, 21 U.S.C. section 360bbb-3(b)(1), unless the authorization is terminated or revoked sooner. Performed at [facility], [place] [address], [place], [place] [address]   MRSA PCR Screening     Status: None   Collection Time: 09/09/19 11:58 PM   Specimen: Nasal Mucosa; Nasopharyngeal  Result Value Ref Range Status   MRSA by PCR NEGATIVE NEGATIVE Final    Comment:        The GeneXpert  MRSA Assay (FDA approved for NASAL specimens only), is one component of a comprehensive MRSA colonization surveillance program. It is not intended to diagnose MRSA infection nor to guide or monitor treatment for MRSA infections. Performed at [facility], [place] [address], [place], [place] [address]          Radiology Studies: Ct Angio Chest Pe W Or Wo Contrast  Result Date: 09/10/2019 CLINICAL DATA:  Fevers, chills and fatigue began at 1 p.m. after connecting TPN, history of pancreatic cancer. EXAM: CT ANGIOGRAPHY CHEST WITH CONTRAST TECHNIQUE: Multidetector CT imaging of the chest was performed using the standard protocol during bolus administration of intravenous contrast. Multiplanar CT image reconstructions and MIPs were obtained to evaluate the vascular anatomy. CONTRAST:  172mL OMNIPAQUE IOHEXOL 350 MG/ML SOLN COMPARISON:  CT 08/15/2019 FINDINGS: Cardiovascular:  Satisfactory opacification the pulmonary arteries to the segmental level however segmental evaluation is limited given respiratory motion artifact which is most pronounced in the lung bases. No central or lobar pulmonary emboli are identified. Central pulmonary arteries are normal caliber. No CT evidence of right heart strain. Borderline cardiac enlargement is similar to prior. Insert coronary no pericardial effusion. Normal caliber thoracic aorta with a 3 vessel aortic arch. No acute abnormality of the aorta. Tunnel left subclavian approach Port-A-Cath tip terminates at the superior cavoatrial junction. The Port-A-Cath is currently accessed at this time. Other major venous structures are unremarkable. Mediastinum/Nodes: No enlarged mediastinal, hilar or axillary lymph nodes. No acute abnormality of the trachea aside from posterior bowing reflecting imaging during exhalation. Small hiatal hernia. No acute abnormality of the esophagus. Lungs/Pleura: There is extensive respiratory motion in the bases which limits evaluation of the lung parenchyma. There are bandlike areas of opacity in the lung bases which may reflect a combination of atelectasis and/or scarring. Suspect mild centrilobular emphysema. No visible concerning nodules or masses. Upper Abdomen: Predominant left hepatic pneumobilia. Likely related to prior surgery. Remaining portions of the included upper abdomen are unremarkable. Musculoskeletal: Multilevel degenerative changes are present in the imaged portions of the spine. Stable superior endplate compression deformity of T12 with 30% height loss anteriorly. Exaggerated thoracic kyphosis is similar to prior. Remote healed sternal fracture is again noted. Review of the MIP images confirms the above findings. IMPRESSION: 1. Imaging quality is degraded by respiratory motion limiting evaluation of the segmental and subsegmental pulmonary arteries and parenchymal portions of the lung bases. 2. No evidence of  central or lobar pulmonary emboli. 3. Bandlike areas of opacity in the lung bases likely reflect a combination of atelectasis scarring. No other acute intrathoracic process. 4. Aortic Atherosclerosis (ICD10-I70.0). 5. Stable T12 compression deformity or remote healed sternal fracture. Electronically Signed   By: Lovena Le M.D.   On: 09/10/2019 01:52   Dg Chest Port 1 View  Result Date: 09/09/2019 CLINICAL DATA:  Fever and chills. EXAM: PORTABLE CHEST 1 VIEW COMPARISON:  July 17, 2019 FINDINGS: Stable left Port-A-Cath. No pneumothorax. The heart, hila, mediastinum, lungs, and pleura are unremarkable. IMPRESSION: No active disease. Electronically Signed   By: Dorise Bullion III M.D   On: 09/09/2019 20:58        Scheduled Meds: . Chlorhexidine Gluconate Cloth  6 each Topical Daily  . enoxaparin (LOVENOX) injection  40 mg Subcutaneous Daily  . insulin aspart  0-9 Units Subcutaneous Q6H  . levothyroxine  50 mcg Oral Q0600  . mouth rinse  15 mL Mouth Rinse BID  . pantoprazole  40 mg Oral Daily  . sodium chloride  flush  10-40 mL Intracatheter Q12H   Continuous Infusions: . ceFEPime (MAXIPIME) IV Stopped (09/10/19 [id number])  . metronidazole Stopped (09/10/19 0601)  . TPN CYCLIC-ADULT (ION)    . vancomycin Stopped (09/10/19 1104)     LOS: 0 days     Cordelia Poche, MD [facility] 09/10/2019, 1:57 PM  If 7PM-7AM, please contact night-coverage [email or web address]

## 2019-09-10 NOTE — Progress Notes (Signed)
Pharmacy Antibiotic Note  Julie Rollins is a 73 y.o. female admitted on 09/09/2019 with sepsis.  Pharmacy has been consulted for cefepime and vancomycin dosing.  Plan: Cefepime 2 Gm IV q12h Vancomycin 1 Gm IV q24h for est AUC = 498 Flagyl 500 mg IV q8h (MD) F/u scr/cultures/levels  Height: 5\' 4"  (162.6 cm) Weight: 131 lb (59.4 kg) IBW/kg (Calculated) : 54.7  Temp (24hrs), Avg:99.8 F (37.7 C), Min:99.2 F (37.3 C), Max:100.1 F (37.8 C)  Recent Labs  Lab 09/09/19 2050  WBC 9.1  CREATININE 0.82  LATICACIDVEN 1.3    Estimated Creatinine Clearance: 51.2 mL/min (by C-G formula based on SCr of 0.82 mg/dL).    Allergies  Allergen Reactions  . Codeine Nausea Only    Antimicrobials this admission: 10/16 cefepime >>  10/16 flagyl >>  10/16 vancomycin >>  Dose adjustments this admission:   Microbiology results:  BCx:   UCx:    Sputum:    MRSA PCR:   Thank you for allowing pharmacy to be a part of this patient's care.  Dorrene German 09/10/2019 4:22 AM

## 2019-09-10 NOTE — Progress Notes (Signed)
OT Cancellation Note  Patient Details Name: JARYIA HYAMS MRN: [id number] DOB: [birth year]   Cancelled Treatment:    Reason Eval/Treat Not Completed: Fatigue/lethargy limiting ability to participate   Fatigue/lethargy limiting ability to participate(pt stated she didn't get much sleep last night and wants to rest right now. Will follow.)  Waldo, Mickel Baas, OT Acute Rehabilitation Services [phone] [phone]    09/10/2019, 12:27 PM

## 2019-09-10 NOTE — Progress Notes (Signed)
PHARMACY - PHYSICIAN COMMUNICATION CRITICAL VALUE ALERT - BLOOD CULTURE IDENTIFICATION (BCID)  Julie Rollins is an 73 y.o. female has central line access on TPN prior to admission, presented to [facility] on 09/09/2019 with c/o fever and chills.She's currently on vancomycin, cefepime and flagyl for suspected sepsis.  - 1 of 2 blood cx with GPC (Micro lab will not run BCID d/t reagent for the machine being on backorder)  Name of physician (or Provider) Contacted: Dr. Lonny Prude  Current antibiotics: vancomycin, cefepime and flagyl  Changes to prescribed antibiotics recommended:  - continue current abx regimen    Lynelle Doctor 09/10/2019  1:38 PM

## 2019-09-10 NOTE — ED Notes (Signed)
Patient transported to CT 

## 2019-09-11 ENCOUNTER — Inpatient Hospital Stay (HOSPITAL_COMMUNITY): Payer: Medicare HMO

## 2019-09-11 LAB — URINE CULTURE: Culture: 60000 — AB

## 2019-09-11 LAB — COMPREHENSIVE METABOLIC PANEL
ALT: 49 U/L — ABNORMAL HIGH (ref 0–44)
AST: 46 U/L — ABNORMAL HIGH (ref 15–41)
Albumin: 3.1 g/dL — ABNORMAL LOW (ref 3.5–5.0)
Alkaline Phosphatase: 95 U/L (ref 38–126)
Anion gap: 8 (ref 5–15)
BUN: 23 mg/dL (ref 8–23)
CO2: 21 mmol/L — ABNORMAL LOW (ref 22–32)
Calcium: 8.7 mg/dL — ABNORMAL LOW (ref 8.9–10.3)
Chloride: 109 mmol/L (ref 98–111)
Creatinine, Ser: 0.55 mg/dL (ref 0.44–1.00)
GFR calc Af Amer: >60 mL/min (ref >60)
GFR calc non Af Amer: >60 mL/min (ref >60)
Glucose, Bld: 192 mg/dL — ABNORMAL HIGH (ref 70–99)
Potassium: 3.8 mmol/L (ref 3.5–5.1)
Sodium: 138 mmol/L (ref 135–145)
Total Bilirubin: 0.4 mg/dL (ref 0.3–1.2)
Total Protein: 6.3 g/dL — ABNORMAL LOW (ref 6.5–8.1)

## 2019-09-11 LAB — CBC
HCT: 30.9 % — ABNORMAL LOW (ref 36.0–46.0)
Hemoglobin: 9.9 g/dL — ABNORMAL LOW (ref 12.0–15.0)
MCH: 32.9 pg (ref 26.0–34.0)
MCHC: 32 g/dL (ref 30.0–36.0)
MCV: 102.7 fL — ABNORMAL HIGH (ref 80.0–100.0)
Platelets: 162 10*3/uL (ref 150–400)
RBC: 3.01 MIL/uL — ABNORMAL LOW (ref 3.87–5.11)
RDW: 17.8 % — ABNORMAL HIGH (ref 11.5–15.5)
WBC: 4.2 10*3/uL (ref 4.0–10.5)
nRBC: 0 % (ref 0.0–0.2)

## 2019-09-11 LAB — GLUCOSE, CAPILLARY
Glucose-Capillary: 156 mg/dL — ABNORMAL HIGH (ref 70–99)
Glucose-Capillary: 206 mg/dL — ABNORMAL HIGH (ref 70–99)
Glucose-Capillary: 76 mg/dL (ref 70–99)
Glucose-Capillary: 170 mg/dL — ABNORMAL HIGH (ref 70–99)

## 2019-09-11 LAB — DIFFERENTIAL
Abs Immature Granulocytes: 0.03 10*3/uL (ref 0.00–0.07)
Basophils Absolute: 0 10*3/uL (ref 0.0–0.1)
Basophils Relative: 0 %
Eosinophils Absolute: 0 10*3/uL (ref 0.0–0.5)
Eosinophils Relative: 0 %
Immature Granulocytes: 1 %
Lymphocytes Relative: 7 %
Lymphs Abs: 0.3 10*3/uL — ABNORMAL LOW (ref 0.7–4.0)
Monocytes Absolute: 0.3 10*3/uL (ref 0.1–1.0)
Monocytes Relative: 7 %
Neutro Abs: 3.6 10*3/uL (ref 1.7–7.7)
Neutrophils Relative %: 85 %

## 2019-09-11 LAB — MAGNESIUM: Magnesium: 2.2 mg/dL (ref 1.7–2.4)

## 2019-09-11 LAB — PHOSPHORUS: Phosphorus: 2.2 mg/dL — ABNORMAL LOW (ref 2.5–4.6)

## 2019-09-11 LAB — PREALBUMIN: Prealbumin: 20.1 mg/dL (ref 18–38)

## 2019-09-11 LAB — TRIGLYCERIDES: Triglycerides: 81 mg/dL (ref <150)

## 2019-09-11 MED ORDER — INSULIN ASPART 100 UNIT/ML ~~LOC~~ SOLN
0.0000 [IU] | SUBCUTANEOUS | Status: DC
  Administered 2019-09-11: 2 [IU] via SUBCUTANEOUS
  Administered 2019-09-11: 12:00:00 3 [IU] via SUBCUTANEOUS
  Administered 2019-09-12 (×2): 2 [IU] via SUBCUTANEOUS
  Administered 2019-09-12 (×2): 1 [IU] via SUBCUTANEOUS

## 2019-09-11 MED ORDER — POTASSIUM PHOSPHATES 15 MMOLE/5ML IV SOLN
10.0000 mmol | Freq: Once | INTRAVENOUS | Status: AC
  Administered 2019-09-11: 10 mmol via INTRAVENOUS
  Filled 2019-09-11: qty 3.33

## 2019-09-11 MED ORDER — TRAVASOL 10 % IV SOLN
INTRAVENOUS | Status: AC
  Administered 2019-09-11: 18:00:00 via INTRAVENOUS
  Filled 2019-09-11: qty 1050

## 2019-09-11 MED ORDER — ENSURE ENLIVE PO LIQD
237.0000 mL | Freq: Two times a day (BID) | ORAL | Status: DC
  Administered 2019-09-11 – 2019-09-17 (×11): 237 mL via ORAL

## 2019-09-11 NOTE — Progress Notes (Addendum)
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  She is scheduled for Whipple surgery on 10/14/2019 and plan is to continue TPN for nutrition until procedure.  She presented to the ED on 10/16 with c/o fever and chills.  TPN resumed on admission.  Significant events:  10/17: patient's home TPN bag currently running; she stated that she hung bag at 1PM on 10/16; 1 of 2 blood cx bottles with GPC 10/18: Per Dr. Lonny Prude, plan is to consult ID re: possible bacteremia and recom. for port  Today:   Glucose (goal <150): 128-192  Electrolytes:Na, K, Mag, CorrCa wnl; Phos slightly low at 2.2; CL wnl, CO2 slightly low at 21  Renal: scr ok  LFTs: AST/ALT slightly elevated at 46/19, Tbili ok  TGs: 81 (10/18)  Prealbumin: 23.1 (10/17), 20.1 (10/18)  NUTRITIONAL GOALS                                                                                             RD recs: pending   [facility] TPN 3-in-1 formulation: - 18 hr cyclic TPN (total TPN volume 1500 mL) - AA= 104 gm - dextrose= 100 gm - lipid = 45 gm - Total kcal/day = 1256 kcal - lytes in bag: calcium gluconate 9.6 meq, magnesium sulfate 8 meq, potassium acetate 98 mew, potassium phosphate 15 mmol, sodium acetate 77 meq -  trace elements and MVI  Inpatient Custom 18 hr cyclic TPN with 99991111 ml volume provides: -  105 g/day protein (  70 g/L) -  45 g/day Lipid  ( 30  G/L) --> 37% of total TPN kcal - 105  g/day Dextrose (7 %) -  1227 Kcal/day  PLAN  Now:  - potassium phosphate 10 mmol IV x1  At 1800 today:  18 hr cyclic TPN: 44 ml/hr first and last hour, 88 ml/hr for 16 hrs  Electrolytes in TPN: Standard except K 80 meq/L, increase phos to standard 15 meq/L, Cl:Ac ratio -- max acetate  Plan to advance as tolerated to the goal rate.  TPN to contain standard multivitamins and trace elements.  NS at 100 ml/hr-- per MD  Add sensitive SSI q6h (3AM, 1PM, 3PM, 8PM)  CBGs: 2 hrs past cyclic TPN start + 1 hr past cycle TPN d/c'ed + middle of TPN infusion + while off TPN  TPN lab panels on Mondays & Thursdays.  F/u daily.   Jori Thrall P 09/11/2019,7:12 AM

## 2019-09-11 NOTE — Progress Notes (Signed)
Initial Nutrition Assessment  INTERVENTION:   -Cyclic TPN per Pharmacy -Ensure Enlive po BID, each supplement provides 350 kcal and 20 grams of protein -Magic cup BID with meals, each supplement provides 290 kcal and 9 grams of protein  NUTRITION DIAGNOSIS:   Increased nutrient needs related to cancer and cancer related treatments as evidenced by estimated needs  GOAL:   Patient will meet greater than or equal to 90% of their needs  MONITOR:   PO intake, Supplement acceptance, Labs, Weight trends, I & O's(TPN)  REASON FOR ASSESSMENT:   Consult New TPN/TNA, Assessment of nutrition requirement/status  ASSESSMENT:   73 y.o. 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  Last follow-up by [place], 10/6.  **RD working remotely**  Patient with continued home TPN and following a liquid diet until whipple procedure in November.  Cyclic TPN has been resumed this admission, receiving 1227 kcal and 105g protein daily.  On full liquid diet, pt drinks Ensure, Carnation Instant Breakfast and ice creams at home for kcal/protein sources.  Will order Ensure and Magic Cups for this admission.  Per weight records, pt's weight has increased since 9/23 (+10 lbs).  I/Os: +2.2L since admit UOP 10/17: 1550 ml  Medications: K-Phos infusion  Labs reviewed:  CBGs: 112-156 Low Phos TG: 81  NUTRITION - FOCUSED PHYSICAL EXAM:  Unable to perform -working remotely.  Diet Order:   Diet Order            Diet full liquid Room service appropriate? Yes; Fluid consistency: Thin  Diet effective now              EDUCATION NEEDS:   No education needs have been identified at this time  Skin:  Skin Assessment: Reviewed RN  Assessment  Last BM:  10/17  Height:   Ht Readings from Last 1 Encounters:  09/09/19 5\' 4"  (1.626 m)    Weight:   Wt Readings from Last 1 Encounters:  09/10/19 63.2 kg    Ideal Body Weight:  54.5 kg  BMI:  Body mass index is 23.92 kg/m.  Estimated Nutritional Needs:   Kcal:  2000-2200  Protein:  100-115g  Fluid:  2L/day  Clayton Bibles, MS, RD, LDN Inpatient Clinical Dietitian Pager: [phone] After Hours Pager: [phone]

## 2019-09-11 NOTE — Progress Notes (Signed)
Patient to transfer to 5E 1508 report given to receiving nurse, all questions answered at this time.  Pt. VSS with no s/s of distress noted.  Patient stable at transfer.

## 2019-09-11 NOTE — Progress Notes (Signed)
PROGRESS NOTE    Julie Rollins  [id number] DOB: 02/29/1946 DOA: 09/09/2019 PCP: Nolene Ebbs, MD   Brief Narrative: Julie Rollins is a 73 y.o. female with medical history significant of pancreatic cancer, breast cancer status post lumpectomy and radiation, hypertension, hypothyroidism. Patient presented with fever of unknown origin. Blood cultures positive for coagulase negative staph, but only one set of blood cultures obtained.   Assessment & Plan:   Active Problems:   Generalized weakness   Pancreatic mass   Hypothyroidism   Port-A-Cath in place   Partial gastric outlet obstruction   Moderate protein-calorie malnutrition ([place])   Sepsis ([place])   Sepsis Present on admission. No clear source, but blood culture positive for 1/4 GPC. Second set of blood cultures not available in micro, although ordered. Febrile just under 24 hours prior. Vitals improved. Urine culture with multiple species and unhelpful. -Continue Vancomycin, Cefepime, Flagyl -Follow blood cultures (will obtain sensitivities) -Will obtain workup for possible sinusitis as source -May need ID recommendations   Facial pain Patient with pain around maxillary/frontal sinuses. Possible sinusitis -CT Maxillofacial  Pancreatic mass Plan for Whipple procedure in November. Follows with oncology  Hypothyroidism Normal TSH -Continue Synthroid  Partial gastric outlet obstruction -Continue TPN/liquid diet  Tachycardia Likely secondary to presumed sepsis. Improved. Chest CTA negative for PE  Moderate malnutrition -TPN -Dietitian consulted   DVT prophylaxis: Lovenox Code Status:   Code Status: Full Code Family Communication: None at bedside Disposition Plan: Transfer to med-surg. Discharge pending sepsis workup   Consultants:   None  Procedures:   None  Antimicrobials:  Vancomycin  Cefepime  Flagyl    Subjective: Facial pain above eyes and over cheeks. Some chills yesterday that she  feels down her back.  Objective: Vitals:   09/11/19 0600 09/11/19 0700 09/11/19 0800 09/11/19 0900  BP: (!) 120/56 (!) 122/55 (!) 114/54 139/61  Pulse: 84 83 83 85  Resp: 16 17 18 18   Temp:   98.5 F (36.9 C)   TempSrc:      SpO2: 96% 97% 96% 96%  Weight:      Height:        Intake/Output Summary (Last 24 hours) at 09/11/2019 1053 Last data filed at 09/11/2019 1046 Gross per 24 hour  Intake 2179.24 ml  Output 1350 ml  Net 829.24 ml   Filed Weights   09/09/19 2029 09/10/19 0925  Weight: 59.4 kg 63.2 kg    Examination:  General exam: Appears calm and comfortable HEENT: maxillary/facial sinus tenderness Respiratory system: Clear to auscultation. Respiratory effort normal. Cardiovascular system: S1 & S2 heard, RRR. No murmurs, rubs, gallops or clicks. Gastrointestinal system: Abdomen is nondistended, soft and nontender. No organomegaly or masses felt. Normal bowel sounds heard. Central nervous system: Alert and oriented. No focal neurological deficits. Extremities: No edema. No calf tenderness Skin: No cyanosis. No rashes. Site overlying port appears non-erythematous Psychiatry: Judgement and insight appear normal. Mood & affect appropriate.      Data Reviewed: I have personally reviewed following labs and imaging studies  CBC: Recent Labs  Lab 09/09/19 2050 09/10/19 0428 09/11/19 0254  WBC 9.1 6.9 4.2  NEUTROABS 8.1*  --  3.6  HGB 11.1* 9.8* 9.9*  HCT 33.9* 31.1* 30.9*  MCV 102.4* 103.3* 102.7*  PLT 234 192 0000000   Basic Metabolic Panel: Recent Labs  Lab 09/09/19 2050 09/10/19 0428 09/11/19 0254  NA 138 138 138  K 3.8 3.6 3.8  CL 102 106 109  CO2 26 23 21*  GLUCOSE 154* 160* 192*  BUN 36* 28* 23  CREATININE 0.82 0.69 0.55  CALCIUM 9.8 8.4* 8.7*  MG 2.1 1.9 2.2  PHOS 3.7 2.9 2.2*   GFR: Estimated Creatinine Clearance: 52.5 mL/min (by C-G formula based on SCr of 0.55 mg/dL). Liver Function Tests: Recent Labs  Lab 09/09/19 2050 09/10/19 0428  09/11/19 0254  AST 25 24 46*  ALT 22 22 49*  ALKPHOS 102 84 95  BILITOT 0.5 0.5 0.4  PROT 7.4 5.9* 6.3*  ALBUMIN 3.9 3.0* 3.1*   Recent Labs  Lab 09/09/19 2050  LIPASE 40   No results for input(s): AMMONIA in the last 168 hours. Coagulation Profile: Recent Labs  Lab 09/09/19 2050  INR 1.0   Cardiac Enzymes: No results for input(s): CKTOTAL, CKMB, CKMBINDEX, TROPONINI in the last 168 hours. BNP (last 3 results) No results for input(s): PROBNP in the last 8760 hours. HbA1C: No results for input(s): HGBA1C in the last 72 hours. CBG: Recent Labs  Lab 09/10/19 1216 09/10/19 1830 09/10/19 1855 09/10/19 2313 09/11/19 0614  GLUCAP 128* 147* 143* 112* 156*   Lipid Profile: Recent Labs    09/11/19 0254  TRIG 81   Thyroid Function Tests: Recent Labs    09/10/19 0428  TSH 1.265   Anemia Panel: No results for input(s): VITAMINB12, FOLATE, FERRITIN, TIBC, IRON, RETICCTPCT in the last 72 hours. Sepsis Labs: Recent Labs  Lab 09/09/19 2050 09/10/19 0428  PROCALCITON  --  0.21  LATICACIDVEN 1.3 1.2    Recent Results (from the past 240 hour(s))  Blood Culture (routine x 2)     Status: Abnormal (Preliminary result)   Collection Time: 09/09/19  8:28 PM   Specimen: BLOOD  Result Value Ref Range Status   Specimen Description   Final    BLOOD LEFT ANTECUBITAL Performed at [facility], [place] [address] [place], [place] [address]    Special Requests   Final    BOTTLES DRAWN AEROBIC AND ANAEROBIC Blood Culture results may not be optimal due to an excessive volume of blood received in culture bottles Performed at [facility] [address], [place], [place] [address]    Culture  Setup Time   Final    GRAM POSITIVE COCCI IN BOTH AEROBIC AND ANAEROBIC BOTTLES CRITICAL RESULT CALLED TO, READ BACK BY AND VERIFIED WITH: PHARMD A PHAM [id number] AT 38 BY CM    Culture (A)  Final    STAPHYLOCOCCUS SPECIES (COAGULASE NEGATIVE) THE SIGNIFICANCE OF  ISOLATING THIS ORGANISM FROM A SINGLE SET OF BLOOD CULTURES WHEN MULTIPLE SETS ARE DRAWN IS UNCERTAIN. PLEASE NOTIFY THE MICROBIOLOGY DEPARTMENT WITHIN ONE WEEK IF SPECIATION AND SENSITIVITIES ARE REQUIRED. Performed at [facility], [place] [address] [place], [place] [address]    Report Status PENDING  Incomplete  Urine culture     Status: Abnormal   Collection Time: 09/09/19  8:39 PM   Specimen: In/Out Cath Urine  Result Value Ref Range Status   Specimen Description   Final    IN/OUT CATH URINE Performed at [facility] [address], [place], [place] [address]    Special Requests   Final    NONE Performed at [facility], [place] [address] [place], [place] [address]    Culture (A)  Final    60,000 COLONIES/mL MULTIPLE SPECIES PRESENT, SUGGEST RECOLLECTION   Report Status 09/11/2019 FINAL  Final  SARS CORONAVIRUS 2 (TAT 6-24 HRS) Nasopharyngeal Nasopharyngeal Swab  Status: None   Collection Time: 09/09/19  8:53 PM   Specimen: Nasopharyngeal Swab  Result Value Ref Range Status   SARS Coronavirus 2 NEGATIVE NEGATIVE Final    Comment: (NOTE) SARS-CoV-2 target nucleic acids are NOT DETECTED. The SARS-CoV-2 RNA is generally detectable in upper and lower respiratory specimens during the acute phase of infection. Negative results do not preclude SARS-CoV-2 infection, do not rule out co-infections with other pathogens, and should not be used as the sole basis for treatment or other patient management decisions. Negative results must be combined with clinical observations, patient history, and epidemiological information. The expected result is Negative. Fact Sheet for Patients: SugarRoll.be Fact Sheet for Healthcare Providers: https://www.woods-mathews.com/ This test is not yet approved or cleared by the [place] FDA and  has been authorized for detection and/or diagnosis of SARS-CoV-2 by FDA  under an Emergency Use Authorization (EUA). This EUA will remain  in effect (meaning this test can be used) for the duration of the COVID-19 declaration under Section 56 4(b)(1) of the Act, 21 U.S.C. section 360bbb-3(b)(1), unless the authorization is terminated or revoked sooner. Performed at [facility], [place] [address], [place], [place] [address]   MRSA PCR Screening     Status: None   Collection Time: 09/09/19 11:58 PM   Specimen: Nasal Mucosa; Nasopharyngeal  Result Value Ref Range Status   MRSA by PCR NEGATIVE NEGATIVE Final    Comment:        The GeneXpert MRSA Assay (FDA approved for NASAL specimens only), is one component of a comprehensive MRSA colonization surveillance program. It is not intended to diagnose MRSA infection nor to guide or monitor treatment for MRSA infections. Performed at [facility], [place] [address], [place], [place] [address]   Respiratory Panel by PCR     Status: None   Collection Time: 09/10/19 10:00 AM   Specimen: Nasopharyngeal Swab; Respiratory  Result Value Ref Range Status   Adenovirus NOT DETECTED NOT DETECTED Final   Coronavirus 229E NOT DETECTED NOT DETECTED Final    Comment: (NOTE) The Coronavirus on the Respiratory Panel, DOES NOT test for the novel  Coronavirus (2019 nCoV)    Coronavirus HKU1 NOT DETECTED NOT DETECTED Final   Coronavirus NL63 NOT DETECTED NOT DETECTED Final   Coronavirus OC43 NOT DETECTED NOT DETECTED Final   Metapneumovirus NOT DETECTED NOT DETECTED Final   Rhinovirus / Enterovirus NOT DETECTED NOT DETECTED Final   Influenza A NOT DETECTED NOT DETECTED Final   Influenza B NOT DETECTED NOT DETECTED Final   Parainfluenza Virus 1 NOT DETECTED NOT DETECTED Final   Parainfluenza Virus 2 NOT DETECTED NOT DETECTED Final   Parainfluenza Virus 3 NOT DETECTED NOT DETECTED Final   Parainfluenza Virus 4 NOT DETECTED NOT DETECTED Final   Respiratory Syncytial Virus NOT DETECTED NOT DETECTED Final     Bordetella pertussis NOT DETECTED NOT DETECTED Final   Chlamydophila pneumoniae NOT DETECTED NOT DETECTED Final   Mycoplasma pneumoniae NOT DETECTED NOT DETECTED Final    Comment: Performed at [facility], [address] [place], [place] [address]         Radiology Studies: Ct Angio Chest Pe W Or Wo Contrast  Result Date: 09/10/2019 CLINICAL DATA:  Fevers, chills and fatigue began at 1 p.m. after connecting TPN, history of pancreatic cancer. EXAM: CT ANGIOGRAPHY CHEST WITH CONTRAST TECHNIQUE: Multidetector CT imaging of the chest was performed using the standard protocol during bolus administration of intravenous contrast. Multiplanar CT image  reconstructions and MIPs were obtained to evaluate the vascular anatomy. CONTRAST:  179mL OMNIPAQUE IOHEXOL 350 MG/ML SOLN COMPARISON:  CT 08/15/2019 FINDINGS: Cardiovascular: Satisfactory opacification the pulmonary arteries to the segmental level however segmental evaluation is limited given respiratory motion artifact which is most pronounced in the lung bases. No central or lobar pulmonary emboli are identified. Central pulmonary arteries are normal caliber. No CT evidence of right heart strain. Borderline cardiac enlargement is similar to prior. Insert coronary no pericardial effusion. Normal caliber thoracic aorta with a 3 vessel aortic arch. No acute abnormality of the aorta. Tunnel left subclavian approach Port-A-Cath tip terminates at the superior cavoatrial junction. The Port-A-Cath is currently accessed at this time. Other major venous structures are unremarkable. Mediastinum/Nodes: No enlarged mediastinal, hilar or axillary lymph nodes. No acute abnormality of the trachea aside from posterior bowing reflecting imaging during exhalation. Small hiatal hernia. No acute abnormality of the esophagus. Lungs/Pleura: There is extensive respiratory motion in the bases which limits evaluation of the lung parenchyma. There are bandlike areas of  opacity in the lung bases which may reflect a combination of atelectasis and/or scarring. Suspect mild centrilobular emphysema. No visible concerning nodules or masses. Upper Abdomen: Predominant left hepatic pneumobilia. Likely related to prior surgery. Remaining portions of the included upper abdomen are unremarkable. Musculoskeletal: Multilevel degenerative changes are present in the imaged portions of the spine. Stable superior endplate compression deformity of T12 with 30% height loss anteriorly. Exaggerated thoracic kyphosis is similar to prior. Remote healed sternal fracture is again noted. Review of the MIP images confirms the above findings. IMPRESSION: 1. Imaging quality is degraded by respiratory motion limiting evaluation of the segmental and subsegmental pulmonary arteries and parenchymal portions of the lung bases. 2. No evidence of central or lobar pulmonary emboli. 3. Bandlike areas of opacity in the lung bases likely reflect a combination of atelectasis scarring. No other acute intrathoracic process. 4. Aortic Atherosclerosis (ICD10-I70.0). 5. Stable T12 compression deformity or remote healed sternal fracture. Electronically Signed   By: Lovena Le M.D.   On: 09/10/2019 01:52   Dg Chest Port 1 View  Result Date: 09/09/2019 CLINICAL DATA:  Fever and chills. EXAM: PORTABLE CHEST 1 VIEW COMPARISON:  July 17, 2019 FINDINGS: Stable left Port-A-Cath. No pneumothorax. The heart, hila, mediastinum, lungs, and pleura are unremarkable. IMPRESSION: No active disease. Electronically Signed   By: Dorise Bullion III M.D   On: 09/09/2019 20:58        Scheduled Meds:  Chlorhexidine Gluconate Cloth  6 each Topical Daily   enoxaparin (LOVENOX) injection  40 mg Subcutaneous Daily   feeding supplement (ENSURE ENLIVE)  237 mL Oral BID BM   insulin aspart  0-9 Units Subcutaneous 4 times per day   levothyroxine  50 mcg Oral Q0600   mouth rinse  15 mL Mouth Rinse BID   pantoprazole  40 mg  Oral Daily   sodium chloride flush  10-40 mL Intracatheter Q12H   Continuous Infusions:  ceFEPime (MAXIPIME) IV Stopped (09/11/19 0844)   metronidazole Stopped (09/11/19 0724)   potassium PHOSPHATE IVPB (in mmol) 42 mL/hr at [id number] [id number]   TPN CYCLIC-ADULT (ION) 44 mL/hr at [id number] [id number]   TPN CYCLIC-ADULT (ION)     vancomycin Stopped (09/10/19 1104)     LOS: 1 day     Cordelia Poche, MD [facility] 09/11/2019, 10:53 AM  If 7PM-7AM, please contact night-coverage [email or web address]

## 2019-09-11 NOTE — Evaluation (Signed)
Physical Therapy Evaluation Patient Details Name: DAJIA CHAMBLEY MRN: [id number] DOB: [birth year] Today's Date: 09/11/2019   History of Present Illness  73 y.o. female with medical history significant of pancreatic cancer, breast cancer status post lumpectomy and radiation, hypertension, hypothyroidism admitted with fever, chills. Dx of sepsis. Whipple procedure planned for November.  Clinical Impression  Pt admitted as above and presenting with functional mobility limitations 2* generalized weakness and mild ambulatory balance deficits.  Pt it determined to progress to dc home with ltd assist of friends and family.    Follow Up Recommendations No PT follow up    Equipment Recommendations  None recommended by PT    Recommendations for Other Services       Precautions / Restrictions Precautions Precautions: Fall Restrictions Weight Bearing Restrictions: No      Mobility  Bed Mobility               General bed mobility comments: Pt up on side of bed with nursing  Transfers Overall transfer level: Needs assistance Equipment used: Rolling walker (2 wheeled) Transfers: Sit to/from Stand Sit to Stand: Min assist         General transfer comment: cues for safety, min assist to bring wt up and fwd and to balance in initial standing  Ambulation/Gait Ambulation/Gait assistance: Min assist;Min guard Gait Distance (Feet): 400 Feet Assistive device: Rolling walker (2 wheeled) Gait Pattern/deviations: Step-through pattern;Decreased step length - right;Decreased step length - left;Shuffle;Trunk flexed     General Gait Details: mild general instability largely corrected with use of RW; cues for posture and position from [employer]            Wheelchair Mobility    Modified Rankin (Stroke Patients Only)       Balance Overall balance assessment: Needs assistance Sitting-balance support: No upper extremity supported;Feet supported Sitting balance-Leahy Scale:  Good     Standing balance support: No upper extremity supported Standing balance-Leahy Scale: Fair                               Pertinent Vitals/Pain Pain Assessment: No/denies pain    Home Living Family/patient expects to be discharged to:: Private residence Living Arrangements: Alone Available Help at Discharge: Friend(s);Available PRN/intermittently Type of Home: Apartment Home Access: Level entry     Home Layout: One level Home Equipment: Grab bars - tub/shower;Walker - 4 wheels      Prior Function Level of Independence: Independent         Comments: Pt lives alone and is independent at baseline. She is currently limited by fatigue with mobilty and ADL's. Pt is independent with gait and transfers wtih no device and uses rollator if fatigued, she denies falls in last 6 months     Hand Dominance        Extremity/Trunk Assessment   Upper Extremity Assessment Upper Extremity Assessment: Generalized weakness    Lower Extremity Assessment Lower Extremity Assessment: Generalized weakness       Communication   Communication: No difficulties  Cognition Arousal/Alertness: Awake/alert Behavior During Therapy: Impulsive;WFL for tasks assessed/performed Overall Cognitive Status: Within Functional Limits for tasks assessed                                        General Comments      Exercises     Assessment/Plan  PT Assessment Patient needs continued PT services  PT Problem List Decreased strength;Decreased activity tolerance;Decreased balance;Decreased mobility;Decreased knowledge of use of DME       PT Treatment Interventions DME instruction;Gait training;Functional mobility training;Therapeutic activities;Therapeutic exercise;Patient/family education    PT Goals (Current goals can be found in the Care Plan section)  Acute Rehab PT Goals Patient Stated Goal: HOME PT Goal Formulation: With patient Time For Goal  Achievement: 09/25/19 Potential to Achieve Goals: Good    Frequency Min 3X/week   Barriers to discharge Decreased caregiver support prn assist only    Co-evaluation               AM-PAC PT "6 Clicks" Mobility  Outcome Measure Help needed turning from your back to your side while in a flat bed without using bedrails?: None Help needed moving from lying on your back to sitting on the side of a flat bed without using bedrails?: A Little Help needed moving to and from a bed to a chair (including a wheelchair)?: A Little Help needed standing up from a chair using your arms (e.g., wheelchair or bedside chair)?: A Little Help needed to walk in hospital room?: A Little Help needed climbing 3-5 steps with a railing? : A Little 6 Click Score: 19    End of Session Equipment Utilized During Treatment: Gait belt Activity Tolerance: Patient tolerated treatment well Patient left: Other (comment)(bathroom - RN aware) Nurse Communication: Mobility status PT Visit Diagnosis: Difficulty in walking, not elsewhere classified (R26.2);Muscle weakness (generalized) (M62.81)    Time: [id number] PT Time Calculation (min) (ACUTE ONLY): 23 min   Charges:   PT Evaluation $PT Eval Low Complexity: 1 Low PT Treatments $Gait Training: 8-22 mins        [place] Pager [phone] Office [phone]   Autry Droege 09/11/2019, 4:16 PM

## 2019-09-12 ENCOUNTER — Inpatient Hospital Stay: Payer: Medicare HMO

## 2019-09-12 ENCOUNTER — Inpatient Hospital Stay: Payer: Medicare HMO | Admitting: Hematology

## 2019-09-12 DIAGNOSIS — T80211A Bloodstream infection due to central venous catheter, initial encounter: Principal | ICD-10-CM

## 2019-09-12 DIAGNOSIS — Z9989 Dependence on other enabling machines and devices: Secondary | ICD-10-CM

## 2019-09-12 DIAGNOSIS — E440 Moderate protein-calorie malnutrition: Secondary | ICD-10-CM

## 2019-09-12 DIAGNOSIS — Z8507 Personal history of malignant neoplasm of pancreas: Secondary | ICD-10-CM

## 2019-09-12 DIAGNOSIS — A419 Sepsis, unspecified organism: Secondary | ICD-10-CM

## 2019-09-12 DIAGNOSIS — Z853 Personal history of malignant neoplasm of breast: Secondary | ICD-10-CM

## 2019-09-12 DIAGNOSIS — R7881 Bacteremia: Secondary | ICD-10-CM

## 2019-09-12 DIAGNOSIS — C250 Malignant neoplasm of head of pancreas: Secondary | ICD-10-CM

## 2019-09-12 DIAGNOSIS — Z95828 Presence of other vascular implants and grafts: Secondary | ICD-10-CM

## 2019-09-12 DIAGNOSIS — Z90411 Acquired partial absence of pancreas: Secondary | ICD-10-CM

## 2019-09-12 DIAGNOSIS — B9562 Methicillin resistant Staphylococcus aureus infection as the cause of diseases classified elsewhere: Secondary | ICD-10-CM

## 2019-09-12 LAB — DIFFERENTIAL
Abs Immature Granulocytes: 0 10*3/uL (ref 0.00–0.07)
Basophils Absolute: 0 10*3/uL (ref 0.0–0.1)
Basophils Relative: 1 %
Eosinophils Absolute: 0.1 10*3/uL (ref 0.0–0.5)
Eosinophils Relative: 6 %
Immature Granulocytes: 0 %
Lymphocytes Relative: 22 %
Lymphs Abs: 0.5 10*3/uL — ABNORMAL LOW (ref 0.7–4.0)
Monocytes Absolute: 0.3 10*3/uL (ref 0.1–1.0)
Monocytes Relative: 13 %
Neutro Abs: 1.4 10*3/uL — ABNORMAL LOW (ref 1.7–7.7)
Neutrophils Relative %: 58 %

## 2019-09-12 LAB — CULTURE, BLOOD (ROUTINE X 2)

## 2019-09-12 LAB — GLUCOSE, CAPILLARY
Glucose-Capillary: 132 mg/dL — ABNORMAL HIGH (ref 70–99)
Glucose-Capillary: 158 mg/dL — ABNORMAL HIGH (ref 70–99)
Glucose-Capillary: 158 mg/dL — ABNORMAL HIGH (ref 70–99)
Glucose-Capillary: 162 mg/dL — ABNORMAL HIGH (ref 70–99)
Glucose-Capillary: 141 mg/dL — ABNORMAL HIGH (ref 70–99)

## 2019-09-12 LAB — CBC
HCT: 30.5 % — ABNORMAL LOW (ref 36.0–46.0)
Hemoglobin: 9.6 g/dL — ABNORMAL LOW (ref 12.0–15.0)
MCH: 32.8 pg (ref 26.0–34.0)
MCHC: 31.5 g/dL (ref 30.0–36.0)
MCV: 104.1 fL — ABNORMAL HIGH (ref 80.0–100.0)
Platelets: 151 10*3/uL (ref 150–400)
RBC: 2.93 MIL/uL — ABNORMAL LOW (ref 3.87–5.11)
RDW: 17.8 % — ABNORMAL HIGH (ref 11.5–15.5)
WBC: 2.4 10*3/uL — ABNORMAL LOW (ref 4.0–10.5)
nRBC: 0 % (ref 0.0–0.2)

## 2019-09-12 LAB — COMPREHENSIVE METABOLIC PANEL
ALT: 36 U/L (ref 0–44)
AST: 29 U/L (ref 15–41)
Albumin: 2.8 g/dL — ABNORMAL LOW (ref 3.5–5.0)
Alkaline Phosphatase: 85 U/L (ref 38–126)
Anion gap: 7 (ref 5–15)
BUN: 28 mg/dL — ABNORMAL HIGH (ref 8–23)
CO2: 23 mmol/L (ref 22–32)
Calcium: 8.8 mg/dL — ABNORMAL LOW (ref 8.9–10.3)
Chloride: 109 mmol/L (ref 98–111)
Creatinine, Ser: 0.46 mg/dL (ref 0.44–1.00)
GFR calc Af Amer: >60 mL/min (ref >60)
GFR calc non Af Amer: >60 mL/min (ref >60)
Glucose, Bld: 149 mg/dL — ABNORMAL HIGH (ref 70–99)
Potassium: 4.4 mmol/L (ref 3.5–5.1)
Sodium: 139 mmol/L (ref 135–145)
Total Bilirubin: 0.1 mg/dL — ABNORMAL LOW (ref 0.3–1.2)
Total Protein: 5.8 g/dL — ABNORMAL LOW (ref 6.5–8.1)

## 2019-09-12 LAB — MAGNESIUM: Magnesium: 2.2 mg/dL (ref 1.7–2.4)

## 2019-09-12 LAB — TRIGLYCERIDES: Triglycerides: 63 mg/dL (ref <150)

## 2019-09-12 LAB — PHOSPHORUS: Phosphorus: 2.7 mg/dL (ref 2.5–4.6)

## 2019-09-12 LAB — PREALBUMIN: Prealbumin: 16.6 mg/dL — ABNORMAL LOW (ref 18–38)

## 2019-09-12 MED ORDER — TRAVASOL 10 % IV SOLN
INTRAVENOUS | Status: AC
  Administered 2019-09-12: 19:00:00 via INTRAVENOUS
  Filled 2019-09-12: qty 1047.2

## 2019-09-12 MED ORDER — LORATADINE 10 MG PO TABS
10.0000 mg | ORAL_TABLET | Freq: Every day | ORAL | Status: DC
  Administered 2019-09-12 – 2019-09-17 (×5): 10 mg via ORAL
  Filled 2019-09-12 (×5): qty 1

## 2019-09-12 MED ORDER — FLUTICASONE PROPIONATE 50 MCG/ACT NA SUSP
1.0000 | Freq: Every day | NASAL | Status: DC
  Administered 2019-09-12 – 2019-09-17 (×6): 1 via NASAL
  Filled 2019-09-12: qty 16

## 2019-09-12 NOTE — Progress Notes (Addendum)
VAST spoke with unit nurse and lab tech regarding order for culture from port. Per infectious disease Hatcher, MD blood culture should not be obtained from port as pt has been on abx, it is not best practice, and results will not change pt's plan of care. Plan is to remove port sometime tomorrow (09/13/19). Currently, TPN is infusing through port. Informed unit nurse that once port is removed, if pt is to continue receiving TPN, she will need a PICC or CL. Unit nurse verbalized understanding.

## 2019-09-12 NOTE — Progress Notes (Signed)
PROGRESS NOTE    Julie Rollins  [id number] DOB: 03/01/1946 DOA: 09/09/2019 PCP: Nolene Ebbs, MD   Brief Narrative: Julie Rollins is a 73 y.o. female with medical history significant of pancreatic cancer, breast cancer status post lumpectomy and radiation, hypertension, hypothyroidism. Patient presented with fever of unknown origin. Blood cultures positive for coagulase negative staph, but only one set of blood cultures obtained.   Assessment & Plan:   Active Problems:   Generalized weakness   Pancreatic mass   Hypothyroidism   Port-A-Cath in place   Partial gastric outlet obstruction   Moderate protein-calorie malnutrition ([place])   Sepsis ([place])   Sepsis Present on admission. No clear source, but blood culture positive for 2/4 Staph epi. Second set of blood cultures not available in micro, although ordered. Afebrile for 36 hours. Vitals improved. Urine culture with multiple species and unhelpful. Situation complicated by evidence of identical bacteria species (sensitivities different) two months prior -Continue Vancomycin; discontinue cefepime and Flagyl -ID consult for concern of possible persistent bacteremia -Repeat blood cultures x2 and from port  Facial pain Patient with pain around maxillary/frontal sinuses. No sinusitis on CT maxillofacial. -Claritin, Flonase trial  Pancreatic mass Plan for Whipple procedure in November. Follows with oncology.  Hypothyroidism Normal TSH -Continue Synthroid  Partial gastric outlet obstruction -Continue TPN/liquid diet  Tachycardia Likely secondary to sepsis. Chest CTA negative for PE. Resolved.  Moderate malnutrition -TPN -Dietitian consulted   DVT prophylaxis: Lovenox Code Status:   Code Status: Full Code Family Communication: None at bedside Disposition Plan: Discharge pending sepsis management since there is a high concern for persistent bacteremia   Consultants:   Infectious disease  Medical oncology   Procedures:   None  Antimicrobials:  Vancomycin (10/16>>  Cefepime (10/16>>10/19  Flagyl (10/16>>10/19)   Subjective: Patient feels okay today. Some sinus fullness  Objective: Vitals:   09/11/19 1500 09/11/19 1555 09/11/19 1956 09/12/19 0530  BP: (!) 106/50 (!) 117/57 (!) 125/59 (!) 157/84  Pulse: 84 88 89 95  Resp:  18 18 18   Temp:  98.1 F (36.7 C) 98.2 F (36.8 C) 98 F (36.7 C)  TempSrc:  Oral Oral Oral  SpO2: 98% 97% 99% 98%  Weight:    61.2 kg  Height:        Intake/Output Summary (Last 24 hours) at 09/12/2019 1031 Last data filed at 09/12/2019 0902 Gross per 24 hour  Intake 749.68 ml  Output -  Net 749.68 ml   Filed Weights   09/09/19 2029 09/10/19 0925 09/12/19 0530  Weight: 59.4 kg 63.2 kg 61.2 kg    Examination:  General exam: Appears calm and comfortable Respiratory system: Clear to auscultation. Respiratory effort normal. Cardiovascular system: S1 & S2 heard, RRR. No murmurs, rubs, gallops or clicks. Gastrointestinal system: Abdomen is nondistended, soft and nontender. No organomegaly or masses felt. Normal bowel sounds heard. Central nervous system: Alert and oriented. No focal neurological deficits. Extremities: No edema. No calf tenderness Skin: No cyanosis. No rashes. Port site without overlying erythema or tenderness. No masses seen to suggest underlying abscess Psychiatry: Judgement and insight appear normal. Mood & affect appropriate.     Data Reviewed: I have personally reviewed following labs and imaging studies  CBC: Recent Labs  Lab 09/09/19 2050 09/10/19 0428 09/11/19 0254 09/12/19 0529  WBC 9.1 6.9 4.2 2.4*  NEUTROABS 8.1*  --  3.6 1.4*  HGB 11.1* 9.8* 9.9* 9.6*  HCT 33.9* 31.1* 30.9* 30.5*  MCV 102.4* 103.3* 102.7* 104.1*  PLT 234  192 162 [id number]   Basic Metabolic Panel: Recent Labs  Lab 09/09/19 2050 09/10/19 0428 09/11/19 0254 09/12/19 0529  NA 138 138 138 139  K 3.8 3.6 3.8 4.4  CL 102 106 109 109  CO2 26 23  21* 23  GLUCOSE 154* 160* 192* 149*  BUN 36* 28* 23 28*  CREATININE 0.82 0.69 0.55 0.46  CALCIUM 9.8 8.4* 8.7* 8.8*  MG 2.1 1.9 2.2 2.2  PHOS 3.7 2.9 2.2* 2.7   GFR: Estimated Creatinine Clearance: 52.5 mL/min (by C-G formula based on SCr of 0.46 mg/dL). Liver Function Tests: Recent Labs  Lab 09/09/19 2050 09/10/19 0428 09/11/19 0254 09/12/19 0529  AST 25 24 46* 29  ALT 22 22 49* 36  ALKPHOS 102 84 95 85  BILITOT 0.5 0.5 0.4 0.1*  PROT 7.4 5.9* 6.3* 5.8*  ALBUMIN 3.9 3.0* 3.1* 2.8*   Recent Labs  Lab 09/09/19 2050  LIPASE 40   No results for input(s): AMMONIA in the last 168 hours. Coagulation Profile: Recent Labs  Lab 09/09/19 2050  INR 1.0   Cardiac Enzymes: No results for input(s): CKTOTAL, CKMB, CKMBINDEX, TROPONINI in the last 168 hours. BNP (last 3 results) No results for input(s): PROBNP in the last 8760 hours. HbA1C: No results for input(s): HGBA1C in the last 72 hours. CBG: Recent Labs  Lab 09/11/19 1211 09/11/19 1519 09/11/19 2034 09/12/19 0307 09/12/19 0753  GLUCAP 206* 76 170* 132* 158*   Lipid Profile: Recent Labs    09/11/19 0254 09/12/19 0529  TRIG 81 63   Thyroid Function Tests: Recent Labs    09/10/19 0428  TSH 1.265   Anemia Panel: No results for input(s): VITAMINB12, FOLATE, FERRITIN, TIBC, IRON, RETICCTPCT in the last 72 hours. Sepsis Labs: Recent Labs  Lab 09/09/19 2050 09/10/19 0428  PROCALCITON  --  0.21  LATICACIDVEN 1.3 1.2    Recent Results (from the past 240 hour(s))  Blood Culture (routine x 2)     Status: Abnormal   Collection Time: 09/09/19  8:28 PM   Specimen: BLOOD  Result Value Ref Range Status   Specimen Description   Final    BLOOD LEFT ANTECUBITAL Performed at [facility], [place] [address], [place], [place] [address]    Special Requests   Final    BOTTLES DRAWN AEROBIC AND ANAEROBIC Blood Culture results may not be optimal due to an excessive volume of blood received in culture bottles  Performed at [facility] [address] [place], [place] [address]    Culture  Setup Time   Final    GRAM POSITIVE COCCI IN BOTH AEROBIC AND ANAEROBIC BOTTLES CRITICAL RESULT CALLED TO, READ BACK BY AND VERIFIED WITH: PHARMD A PHAM [id number] AT 70 BY CM Performed at [facility], [place] [address], [place], [place] [address]    Culture STAPHYLOCOCCUS EPIDERMIDIS (A)  Final   Report Status 09/12/2019 FINAL  Final   Organism ID, Bacteria STAPHYLOCOCCUS EPIDERMIDIS  Final      Susceptibility   Staphylococcus epidermidis - MIC*    CIPROFLOXACIN >=8 RESISTANT Resistant     ERYTHROMYCIN >=8 RESISTANT Resistant     GENTAMICIN <=0.5 SENSITIVE Sensitive     OXACILLIN >=4 RESISTANT Resistant     TETRACYCLINE 2 SENSITIVE Sensitive     VANCOMYCIN 1 SENSITIVE Sensitive     TRIMETH/SULFA 80 RESISTANT Resistant     CLINDAMYCIN RESISTANT Resistant     RIFAMPIN <=0.5 SENSITIVE Sensitive     Inducible Clindamycin POSITIVE  Resistant     * STAPHYLOCOCCUS EPIDERMIDIS  Urine culture     Status: Abnormal   Collection Time: 09/09/19  8:39 PM   Specimen: In/Out Cath Urine  Result Value Ref Range Status   Specimen Description   Final    IN/OUT CATH URINE Performed at [facility] [address], [place], [place] [address]    Special Requests   Final    NONE Performed at [facility], [place] [address] [place], [place] [address]    Culture (A)  Final    60,000 COLONIES/mL MULTIPLE SPECIES PRESENT, SUGGEST RECOLLECTION   Report Status 09/11/2019 FINAL  Final  SARS CORONAVIRUS 2 (TAT 6-24 HRS) Nasopharyngeal Nasopharyngeal Swab     Status: None   Collection Time: 09/09/19  8:53 PM   Specimen: Nasopharyngeal Swab  Result Value Ref Range Status   SARS Coronavirus 2 NEGATIVE NEGATIVE Final    Comment: (NOTE) SARS-CoV-2 target nucleic acids are NOT DETECTED. The SARS-CoV-2 RNA is generally detectable in upper and lower respiratory specimens  during the acute phase of infection. Negative results do not preclude SARS-CoV-2 infection, do not rule out co-infections with other pathogens, and should not be used as the sole basis for treatment or other patient management decisions. Negative results must be combined with clinical observations, patient history, and epidemiological information. The expected result is Negative. Fact Sheet for Patients: SugarRoll.be Fact Sheet for Healthcare Providers: https://www.woods-mathews.com/ This test is not yet approved or cleared by the [place] FDA and  has been authorized for detection and/or diagnosis of SARS-CoV-2 by FDA under an Emergency Use Authorization (EUA). This EUA will remain  in effect (meaning this test can be used) for the duration of the COVID-19 declaration under Section 56 4(b)(1) of the Act, 21 U.S.C. section 360bbb-3(b)(1), unless the authorization is terminated or revoked sooner. Performed at [facility], [place] [address] [place], [place] [address]   MRSA PCR Screening     Status: None   Collection Time: 09/09/19 11:58 PM   Specimen: Nasal Mucosa; Nasopharyngeal  Result Value Ref Range Status   MRSA by PCR NEGATIVE NEGATIVE Final    Comment:        The GeneXpert MRSA Assay (FDA approved for NASAL specimens only), is one component of a comprehensive MRSA colonization surveillance program. It is not intended to diagnose MRSA infection nor to guide or monitor treatment for MRSA infections. Performed at [facility], [place] [address], [place], [place] [address]   Respiratory Panel by PCR     Status: None   Collection Time: 09/10/19 10:00 AM   Specimen: Nasopharyngeal Swab; Respiratory  Result Value Ref Range Status   Adenovirus NOT DETECTED NOT DETECTED Final   Coronavirus 229E NOT DETECTED NOT DETECTED Final    Comment: (NOTE) The Coronavirus on the Respiratory Panel, DOES NOT test for the  novel  Coronavirus (2019 nCoV)    Coronavirus HKU1 NOT DETECTED NOT DETECTED Final   Coronavirus NL63 NOT DETECTED NOT DETECTED Final   Coronavirus OC43 NOT DETECTED NOT DETECTED Final   Metapneumovirus NOT DETECTED NOT DETECTED Final   Rhinovirus / Enterovirus NOT DETECTED NOT DETECTED Final   Influenza A NOT DETECTED NOT DETECTED Final   Influenza B NOT DETECTED NOT DETECTED Final   Parainfluenza Virus 1 NOT DETECTED NOT DETECTED Final   Parainfluenza Virus 2 NOT DETECTED NOT DETECTED Final   Parainfluenza Virus 3 NOT DETECTED NOT DETECTED Final   Parainfluenza Virus 4 NOT  DETECTED NOT DETECTED Final   Respiratory Syncytial Virus NOT DETECTED NOT DETECTED Final   Bordetella pertussis NOT DETECTED NOT DETECTED Final   Chlamydophila pneumoniae NOT DETECTED NOT DETECTED Final   Mycoplasma pneumoniae NOT DETECTED NOT DETECTED Final    Comment: Performed at [facility], [place] [address] [place],  [address]         Radiology Studies: Ct Maxillofacial Wo Contrast  Result Date: 09/11/2019 CLINICAL DATA:  Fever of unknown origin. Question sinusitis. EXAM: CT MAXILLOFACIAL WITHOUT CONTRAST TECHNIQUE: Multidetector CT imaging of the maxillofacial structures was performed. Multiplanar CT image reconstructions were also generated. COMPARISON:  None. FINDINGS: Osseous: No focal lytic or blastic lesion is present. There is no acute or healing fracture. Orbits: Bilateral lens replacements are noted. Globes and orbits are otherwise unremarkable. Sinuses: The paranasal sinuses and mastoid air cells are clear. Soft tissues: Soft tissues the face are unremarkable. Limited intracranial: Within normal limits IMPRESSION: Negative CT of the face. Electronically Signed   By: San Morelle M.D.   On: 09/11/2019 19:01        Scheduled Meds: . Chlorhexidine Gluconate Cloth  6 each Topical Daily  . enoxaparin (LOVENOX) injection  40 mg Subcutaneous Daily  . feeding supplement (ENSURE  ENLIVE)  237 mL Oral BID BM  . insulin aspart  0-9 Units Subcutaneous 4 times per day  . levothyroxine  50 mcg Oral Q0600  . mouth rinse  15 mL Mouth Rinse BID  . pantoprazole  40 mg Oral Daily  . sodium chloride flush  10-40 mL Intracatheter Q12H   Continuous Infusions: . TPN CYCLIC-ADULT (ION) 88 mL/hr at 09/11/19 1900  . vancomycin 1,000 mg (09/12/19 0948)     LOS: 2 days     Cordelia Poche, MD [facility] 09/12/2019, 10:31 AM  If 7PM-7AM, please contact night-coverage [email or web address]

## 2019-09-12 NOTE — Progress Notes (Addendum)
HEMATOLOGY-ONCOLOGY PROGRESS NOTE  SUBJECTIVE: The patient presented to the emergency room secondary to fever and chills.  The patient developed sudden fevers, chills, and fatigue after hooking up her TPN on the day of admission.  She reported a fever up to 103 at home.  The patient has been on Lynparza 100 mg twice a day at home.  Awaiting Whipple procedure which is currently scheduled for 09/28/2019.  Respiratory influenza panel negative. COVID-19 testing negative.  Urine culture shows 60,000 colonies of multiple species.  Blood culture drawn from the left antecubital with gram-positive cocci in both aerobic and anaerobic bottles consistent with staph epidermis.  I do not see that a blood culture was drawn from her Port-A-Cath.  When seen today, the patient reports ongoing fatigue and reports not feeling well overall.  Has remained afebrile over the past 24 hours.  Reports a nonproductive cough but this is not a new symptom.  Has had intermittent discomfort at her left chest Port-A-Cath site, but no redness or swelling noted.  Denies chest pain or shortness of breath.  Denies abdominal pain, nausea, vomiting, diarrhea.  She has not noticed any bleeding.    Oncology History  Carcinoma of head of pancreas ([place])  03/28/2019 Imaging   CT AP w contrast IMPRESSION: Interval development of 20 x 14 mm ill-defined low density in pancreatic head concerning for possible neoplasm or malignancy. This appears to be resulting in severe intrahepatic and extrahepatic biliary dilatation. ERCP is recommended for further evaluation.  Sigmoid diverticulosis without inflammation.   03/29/2019 Initial Biopsy   Diagnosis FINE NEEDLE ASPIRATION, ENDOSCOPIC EGUS, PANCREATIC HEAD MASS (SPECIMEN 1 OF 2 COLLECTED 03/29/2019) MALIGNANT CELLS CONSISTENT WITH ADENOCARCINOMA.   03/29/2019 Initial Biopsy   Diagnosis Duodenum, Biopsy, abnormal mucosa at major papilla - ADENOCARCINOMA. SEE NOTE Diagnosis Note The duodenal mucosa is  negative for dysplasia. The findings are consistent with duodenal involvement from patient's suspected primary pancreatic adenocarcinoma. Dr. Melina Copa has reviewed this case and concurs with the above interpretation. Dr. Ardis Hughs was notified on 03/30/2019.   03/29/2019 Pathology Results   Diagnosis BILE DUCT BRUSHING (SPECIMEN 2 OF 2, COLLECTED ON 03/29/2019): HIGHLY SUSPICIOUS FOR MALIGNANCY.   03/29/2019 Procedure   ERCP with stenting of malignant distal CBD stricture per Dr. Ardis Hughs   04/04/2019 Initial Diagnosis   Malignant neoplasm of head of pancreas ([place])   04/08/2019 Imaging   CT Chest IMPRESSION: No evidence of metastatic disease in the chest. Aortic Atherosclerosis (ICD10-I70.0).    04/13/2019 - 04/27/2019 Chemotherapy   neoadjuvant FOLFIRINOX q3weeks beginning 04/13/19; dose-reduced with cycle 1. Stopped afger 2 cycles due to poor toleration   05/20/2019 Imaging   CT AP W Contrast 05/20/19  05/20/19  IMPRESSION: 1. Mild increase in size of the infiltrative pancreatic head malignancy currently 1.9 by 1.6 cm, previously 1.7 by 1.2 cm by my measurements. No direct major vascular involvement or distant metastatic spread identified. The margin of the tumor is traversed by metallic stent, with resulting pneumobilia. Mildly increased dorsal pancreatic duct dilatation. 2. Diffuse hepatic steatosis. 3. Small hemangioma inferiorly in the right hepatic lobe, stable from 2007. 4. Mild wall thickening of the gallbladder, nonspecific. 5. 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 6. Other imaging findings of potential clinical significance: Sigmoid colon diverticulosis. Degenerative hip arthropathy bilaterally. Old healed sternal fracture. Stable wedge compression fracture at T12. Grade 1 anterolisthesis at L4-5.   06/08/2019 Genetic Testing   Positive genetic testing. 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 The Common Hereditary Cancers Panel offered by [facility] includes sequencing and/or deletion duplication testing of the following 48 genes: APC, ATM, AXIN2, BARD1, BMPR1A, BRCA1, BRCA2, BRIP1, CDH1, CDKN2A (p14ARF), CDKN2A (p16INK4a), CKD4, CHEK2, CTNNA1, DICER1, EPCAM (Deletion/duplication testing only), GREM1 (promoter region deletion/duplication testing only), KIT, MEN1, MLH1, MSH2, MSH3, MSH6, MUTYH, NBN, NF1, NHTL1, PALB2, PDGFRA, PMS2, POLD1, POLE, PTEN, RAD50, RAD51C, RAD51D, RNF43, SDHB, SDHC, SDHD, SMAD4, SMARCA4. STK11, TP53, TSC1, TSC2, and VHL.  The following genes were evaluated for sequence changes only: SDHA and HOXB13 c.251G>A variant only. The report date is 06/08/2019.   06/09/2019 Procedure   EGD by Dr. Ardis Hughs 06/09/19  IMPRESSION - 4 fiducial markers were placed into the pancreatic head mass via transduodenal EUS approach. - Duodenal edema and luminal narrowing in the distal duodenal bulb, likely from adjacent pancreatic mass. This is causing partial gastric outlet obstruction.   06/17/2019 Procedure   Upper Endoscopy by Dr. Carlean Purl 06/17/19  IMRPESSION - Acquired duodenal stenosis. SUSPECT FROM TUMOR AND MASS EFFECT - PARTIAL AND TIGHT - BILIARY STENT COULD BE CONTRIBUTING OR CAUSING THIS BY IRRITATING DUODENAL WALL BUT AT THIS POINT I DO NOT THINK SO - Slight fibrous food (residue) in the stomach. LARGELY CLEAR OF FLUID AND DEBRIS - The examination was otherwise normal. - No specimens collected.   06/28/2019 - 07/08/2019 Radiation Therapy   SBRT 06/28/19-07/08/19   08/05/2019 -  Chemotherapy   Began PARP inhibitor, Olaparib 100 mg BID on 08/05/2019        REVIEW OF SYSTEMS:   As noted in the HPI.  I have reviewed the past medical history, past surgical history, social history and family history with the patient and they are unchanged from previous note.   PHYSICAL EXAMINATION: ECOG PERFORMANCE STATUS: 2 - Symptomatic, <50% confined to  bed  Vitals:   09/11/19 1956 09/12/19 0530  BP: (!) 125/59 (!) 157/84  Pulse: 89 95  Resp: 18 18  Temp: 98.2 F (36.8 C) 98 F (36.7 C)  SpO2: 99% 98%   Filed Weights   09/09/19 2029 09/10/19 0925 09/12/19 0530  Weight: 131 lb (59.4 kg) 139 lb 5.3 oz (63.2 kg) 134 lb 14.7 oz (61.2 kg)    Intake/Output from previous day: 10/18 0701 - 10/19 0700 In: 1112 [I.V.:375.5; IV Piggyback:736.5] Out: -   GENERAL:alert, no distress and comfortable SKIN: skin color, texture, turgor are normal, no rashes or significant lesions EYES: normal, Conjunctiva are pink and non-injected, sclera clear OROPHARYNX:no exudate, no erythema and lips, buccal mucosa, and tongue normal  NECK: supple, thyroid normal size, non-tender, without nodularity LYMPH:  no palpable lymphadenopathy in the cervical, axillary or inguinal CHEST: Left chest Port-A-Cath without redness, swelling, or discomfort with palpation. LUNGS: clear to auscultation and percussion with normal breathing effort HEART: regular rate & rhythm and no murmurs and no lower extremity edema ABDOMEN:abdomen soft, non-tender and normal bowel sounds Musculoskeletal:no cyanosis of digits and no clubbing  NEURO: alert & oriented x 3 with fluent speech, no focal motor/sensory deficits  LABORATORY DATA:  I have reviewed the data as listed CMP Latest Ref Rng & Units 09/12/2019 09/11/2019 09/10/2019  Glucose 70 - 99 mg/dL 149(H) 192(H) 160(H)  BUN 8 - 23 mg/dL 28(H) 23 28(H)  Creatinine 0.44 - 1.00 mg/dL 0.46 0.55 0.69  Sodium 135 - 145 mmol/L 139 138 138  Potassium 3.5 - 5.1 mmol/L 4.4 3.8 3.6  Chloride 98 - 111 mmol/L 109 109 106  CO2 22 - 32 mmol/L 23  21(L) 23  Calcium 8.9 - 10.3 mg/dL 8.8(L) 8.7(L) 8.4(L)  Total Protein 6.5 - 8.1 g/dL 5.8(L) 6.3(L) 5.9(L)  Total Bilirubin 0.3 - 1.2 mg/dL 0.1(L) 0.4 0.5  Alkaline Phos 38 - 126 U/L 85 95 84  AST 15 - 41 U/L 29 46(H) 24  ALT 0 - 44 U/L 36 49(H) 22    Lab Results  Component Value Date   WBC  2.4 (L) 09/12/2019   HGB 9.6 (L) 09/12/2019   HCT 30.5 (L) 09/12/2019   MCV 104.1 (H) 09/12/2019   PLT 151 09/12/2019   NEUTROABS 1.4 (L) 09/12/2019    Ct Chest W Contrast  Result Date: 08/15/2019 CLINICAL DATA:  73 year old female with history of pancreatic neoplasm. Staging examination. EXAM: CT CHEST, ABDOMEN, AND PELVIS WITH CONTRAST TECHNIQUE: Multidetector CT imaging of the chest, abdomen and pelvis was performed following the standard protocol during bolus administration of intravenous contrast. CONTRAST:  117m OMNIPAQUE IOHEXOL 300 MG/ML  SOLN COMPARISON:  CT the abdomen and pelvis 05/20/2019. Chest CT 04/08/2019. FINDINGS: CT CHEST FINDINGS Cardiovascular: Heart size is borderline enlarged. There is no significant pericardial fluid, thickening or pericardial calcification. No atherosclerotic calcifications in the thoracic aorta or the coronary arteries. Left subclavian single-lumen porta cath with tip terminating in the distal superior vena cava. Mediastinum/Nodes: No pathologically enlarged mediastinal or hilar lymph nodes. Please note that accurate exclusion of hilar adenopathy is limited on noncontrast CT scans. Esophagus is unremarkable in appearance. No axillary lymphadenopathy. Lungs/Pleura: No acute consolidative airspace disease. No pleural effusions. No suspicious appearing pulmonary nodules or masses are noted. Areas of linear scarring are noted throughout the lower lobes of the lungs bilaterally and in the posterior aspect of the left upper lobe, similar to the prior examination Musculoskeletal: Old healed fracture of the mid sternum with mild posttraumatic deformity. Chronic appearing compression fracture of T12 with 30% loss of anterior vertebral body height, unchanged. There are no aggressive appearing lytic or blastic lesions noted in the visualized portions of the skeleton. CT ABDOMEN PELVIS FINDINGS Hepatobiliary: No suspicious cystic or solid hepatic lesions. No intra or  extrahepatic biliary ductal dilatation. Gallbladder is normal in appearance. Pneumobilia, particularly in the left lobe of the liver related to indwelling common bile duct stent which is patent. Some high attenuation material within the common bile duct stent likely represents refluxed oral contrast material from the adjacent duodenum. Pancreas: The known pancreatic head mass appears slightly larger than the prior examination, but is poorly defined, estimated to measure approximately 2.9 x 2.1 x 3.3 cm (axial image 96 of series 2 and coronal image 32 of series 5). New compared to the prior study within the midst of the lesion there is some metallic densities, which presumably reflect the recently placed fiducial markers. Immediately anterior to this intimately associated with the anterior margin of the pancreatic head as well as the posterior wall of the duodenal bulb there is a well-defined 1.6 x 1.2 x 1.5 cm centrally low-attenuation fluid collection, likely to represent a small pancreatic pseudocyst (axial image 97 of series 2 and coronal image 28 of series 5). Mild pancreatic ductal dilatation measuring up to 5 mm in the proximal body of the pancreas. Mild atrophy throughout the body and tail of the pancreas. Spleen: Unremarkable. Adrenals/Urinary Tract: 1.4 cm low-attenuation lesion in the anterior aspect of the lower pole the left kidney, compatible with a simple cyst. Right kidney and bilateral adrenal glands are normal in appearance. No hydroureteronephrosis. Urinary bladder is nearly decompressed, but otherwise  unremarkable in appearance. Stomach/Bowel: Normal appearance of the stomach. No pathologic dilatation of small bowel or colon. Numerous colonic diverticulae are noted, particularly in the sigmoid colon, without surrounding inflammatory changes to suggest an acute diverticulitis at this time. Normal appendix. Vascular/Lymphatic: No significant atherosclerotic disease, aneurysm or dissection noted in  the abdominal or pelvic vasculature. No lymphadenopathy noted in the abdomen or pelvis. Reproductive: Status post hysterectomy. Ovaries are not confidently identified may be surgically absent or atrophic. Other: No significant volume of ascites.  No pneumoperitoneum. Musculoskeletal: There are no aggressive appearing lytic or blastic lesions noted in the visualized portions of the skeleton. IMPRESSION: 1. Interval placement of fiducial markers in the lesion in the head of the pancreas. The pancreatic head mass appears slightly larger than the prior examination, estimated to measure approximately 2.9 x 2.1 x 3.3 cm on today's study. There continues to be some mild pancreatic ductal dilatation. Anterior to the lesion there is a small benign cystic appearing structure between the head of the pancreas and the adjacent duodenal bulb, which may represent a tiny pancreatic pseudocyst in the setting of recent EGD for fiducial marker placement. Attention on follow-up studies is recommended. 2. No definite signs of metastatic disease elsewhere in the chest, abdomen or pelvis. 3. Extensive colonic diverticulosis without evidence to suggest an acute diverticulitis at this time. 4. Additional incidental findings, as above. Electronically Signed   By: Vinnie Langton M.D.   On: 08/15/2019 09:59   Ct Angio Chest Pe W Or Wo Contrast  Result Date: 09/10/2019 CLINICAL DATA:  Fevers, chills and fatigue began at 1 p.m. after connecting TPN, history of pancreatic cancer. EXAM: CT ANGIOGRAPHY CHEST WITH CONTRAST TECHNIQUE: Multidetector CT imaging of the chest was performed using the standard protocol during bolus administration of intravenous contrast. Multiplanar CT image reconstructions and MIPs were obtained to evaluate the vascular anatomy. CONTRAST:  132m OMNIPAQUE IOHEXOL 350 MG/ML SOLN COMPARISON:  CT 08/15/2019 FINDINGS: Cardiovascular: Satisfactory opacification the pulmonary arteries to the segmental level however  segmental evaluation is limited given respiratory motion artifact which is most pronounced in the lung bases. No central or lobar pulmonary emboli are identified. Central pulmonary arteries are normal caliber. No CT evidence of right heart strain. Borderline cardiac enlargement is similar to prior. Insert coronary no pericardial effusion. Normal caliber thoracic aorta with a 3 vessel aortic arch. No acute abnormality of the aorta. Tunnel left subclavian approach Port-A-Cath tip terminates at the superior cavoatrial junction. The Port-A-Cath is currently accessed at this time. Other major venous structures are unremarkable. Mediastinum/Nodes: No enlarged mediastinal, hilar or axillary lymph nodes. No acute abnormality of the trachea aside from posterior bowing reflecting imaging during exhalation. Small hiatal hernia. No acute abnormality of the esophagus. Lungs/Pleura: There is extensive respiratory motion in the bases which limits evaluation of the lung parenchyma. There are bandlike areas of opacity in the lung bases which may reflect a combination of atelectasis and/or scarring. Suspect mild centrilobular emphysema. No visible concerning nodules or masses. Upper Abdomen: Predominant left hepatic pneumobilia. Likely related to prior surgery. Remaining portions of the included upper abdomen are unremarkable. Musculoskeletal: Multilevel degenerative changes are present in the imaged portions of the spine. Stable superior endplate compression deformity of T12 with 30% height loss anteriorly. Exaggerated thoracic kyphosis is similar to prior. Remote healed sternal fracture is again noted. Review of the MIP images confirms the above findings. IMPRESSION: 1. Imaging quality is degraded by respiratory motion limiting evaluation of the segmental and subsegmental pulmonary arteries and  parenchymal portions of the lung bases. 2. No evidence of central or lobar pulmonary emboli. 3. Bandlike areas of opacity in the lung bases  likely reflect a combination of atelectasis scarring. No other acute intrathoracic process. 4. Aortic Atherosclerosis (ICD10-I70.0). 5. Stable T12 compression deformity or remote healed sternal fracture. Electronically Signed   By: Lovena Le M.D.   On: 09/10/2019 01:52   Ct Abdomen Pelvis W Contrast  Result Date: 08/15/2019 CLINICAL DATA:  73 year old female with history of pancreatic neoplasm. Staging examination. EXAM: CT CHEST, ABDOMEN, AND PELVIS WITH CONTRAST TECHNIQUE: Multidetector CT imaging of the chest, abdomen and pelvis was performed following the standard protocol during bolus administration of intravenous contrast. CONTRAST:  1103m OMNIPAQUE IOHEXOL 300 MG/ML  SOLN COMPARISON:  CT the abdomen and pelvis 05/20/2019. Chest CT 04/08/2019. FINDINGS: CT CHEST FINDINGS Cardiovascular: Heart size is borderline enlarged. There is no significant pericardial fluid, thickening or pericardial calcification. No atherosclerotic calcifications in the thoracic aorta or the coronary arteries. Left subclavian single-lumen porta cath with tip terminating in the distal superior vena cava. Mediastinum/Nodes: No pathologically enlarged mediastinal or hilar lymph nodes. Please note that accurate exclusion of hilar adenopathy is limited on noncontrast CT scans. Esophagus is unremarkable in appearance. No axillary lymphadenopathy. Lungs/Pleura: No acute consolidative airspace disease. No pleural effusions. No suspicious appearing pulmonary nodules or masses are noted. Areas of linear scarring are noted throughout the lower lobes of the lungs bilaterally and in the posterior aspect of the left upper lobe, similar to the prior examination Musculoskeletal: Old healed fracture of the mid sternum with mild posttraumatic deformity. Chronic appearing compression fracture of T12 with 30% loss of anterior vertebral body height, unchanged. There are no aggressive appearing lytic or blastic lesions noted in the visualized portions  of the skeleton. CT ABDOMEN PELVIS FINDINGS Hepatobiliary: No suspicious cystic or solid hepatic lesions. No intra or extrahepatic biliary ductal dilatation. Gallbladder is normal in appearance. Pneumobilia, particularly in the left lobe of the liver related to indwelling common bile duct stent which is patent. Some high attenuation material within the common bile duct stent likely represents refluxed oral contrast material from the adjacent duodenum. Pancreas: The known pancreatic head mass appears slightly larger than the prior examination, but is poorly defined, estimated to measure approximately 2.9 x 2.1 x 3.3 cm (axial image 96 of series 2 and coronal image 32 of series 5). New compared to the prior study within the midst of the lesion there is some metallic densities, which presumably reflect the recently placed fiducial markers. Immediately anterior to this intimately associated with the anterior margin of the pancreatic head as well as the posterior wall of the duodenal bulb there is a well-defined 1.6 x 1.2 x 1.5 cm centrally low-attenuation fluid collection, likely to represent a small pancreatic pseudocyst (axial image 97 of series 2 and coronal image 28 of series 5). Mild pancreatic ductal dilatation measuring up to 5 mm in the proximal body of the pancreas. Mild atrophy throughout the body and tail of the pancreas. Spleen: Unremarkable. Adrenals/Urinary Tract: 1.4 cm low-attenuation lesion in the anterior aspect of the lower pole the left kidney, compatible with a simple cyst. Right kidney and bilateral adrenal glands are normal in appearance. No hydroureteronephrosis. Urinary bladder is nearly decompressed, but otherwise unremarkable in appearance. Stomach/Bowel: Normal appearance of the stomach. No pathologic dilatation of small bowel or colon. Numerous colonic diverticulae are noted, particularly in the sigmoid colon, without surrounding inflammatory changes to suggest an acute diverticulitis at  this  time. Normal appendix. Vascular/Lymphatic: No significant atherosclerotic disease, aneurysm or dissection noted in the abdominal or pelvic vasculature. No lymphadenopathy noted in the abdomen or pelvis. Reproductive: Status post hysterectomy. Ovaries are not confidently identified may be surgically absent or atrophic. Other: No significant volume of ascites.  No pneumoperitoneum. Musculoskeletal: There are no aggressive appearing lytic or blastic lesions noted in the visualized portions of the skeleton. IMPRESSION: 1. Interval placement of fiducial markers in the lesion in the head of the pancreas. The pancreatic head mass appears slightly larger than the prior examination, estimated to measure approximately 2.9 x 2.1 x 3.3 cm on today's study. There continues to be some mild pancreatic ductal dilatation. Anterior to the lesion there is a small benign cystic appearing structure between the head of the pancreas and the adjacent duodenal bulb, which may represent a tiny pancreatic pseudocyst in the setting of recent EGD for fiducial marker placement. Attention on follow-up studies is recommended. 2. No definite signs of metastatic disease elsewhere in the chest, abdomen or pelvis. 3. Extensive colonic diverticulosis without evidence to suggest an acute diverticulitis at this time. 4. Additional incidental findings, as above. Electronically Signed   By: Vinnie Langton M.D.   On: 08/15/2019 09:59   Ir Cv Line Injection  Result Date: 08/23/2019 INDICATION: Malfunctioning port catheter, receiving chemotherapy and TPN EXAM: Fluoroscopic left subclavian port catheter injection MEDICATIONS: None. ANESTHESIA/SEDATION: Moderate Sedation Time: None. The patient's level of consciousness and vital signs were monitored continuously by radiology nursing throughout the procedure under my direct supervision. FLUOROSCOPY TIME:  Fluoroscopy Time: 0 minutes 6 seconds (3 mGy). COMPLICATIONS: None immediate. PROCEDURE: Informed  written consent was obtained from the patient after a thorough discussion of the procedural risks, benefits and alternatives. All questions were addressed. Maximal Sterile Barrier Technique was utilized including caps, mask, sterile gowns, sterile gloves, sterile drape, hand hygiene and skin antiseptic. A timeout was performed prior to the initiation of the procedure. Under sterile conditions, the left subclavian access port catheter was injected contrast. Fluoroscopic subtraction imaging. Left subclavian port catheter is intact and patent. Tip is in the proximal SVC. Small amount of fibrin sheath at the tip noted. No occlusion. IMPRESSION: Left subclavian power port catheter tip proximal SVC. Small amount of tip fibrin sheath noted. Electronically Signed   By: Jerilynn Mages.  Shick M.D.   On: 08/23/2019 10:22   Dg Chest Port 1 View  Result Date: 09/09/2019 CLINICAL DATA:  Fever and chills. EXAM: PORTABLE CHEST 1 VIEW COMPARISON:  July 17, 2019 FINDINGS: Stable left Port-A-Cath. No pneumothorax. The heart, hila, mediastinum, lungs, and pleura are unremarkable. IMPRESSION: No active disease. Electronically Signed   By: Dorise Bullion III M.D   On: 09/09/2019 20:58   Ct Maxillofacial Wo Contrast  Result Date: 09/11/2019 CLINICAL DATA:  Fever of unknown origin. Question sinusitis. EXAM: CT MAXILLOFACIAL WITHOUT CONTRAST TECHNIQUE: Multidetector CT imaging of the maxillofacial structures was performed. Multiplanar CT image reconstructions were also generated. COMPARISON:  None. FINDINGS: Osseous: No focal lytic or blastic lesion is present. There is no acute or healing fracture. Orbits: Bilateral lens replacements are noted. Globes and orbits are otherwise unremarkable. Sinuses: The paranasal sinuses and mastoid air cells are clear. Soft tissues: Soft tissues the face are unremarkable. Limited intracranial: Within normal limits IMPRESSION: Negative CT of the face. Electronically Signed   By: San Morelle M.D.    On: 09/11/2019 19:01    ASSESSMENT AND PLAN: 1.  Adenocarcinoma the pancreatic head 2.  Sepsis 3.  Partial  gastric outlet obstruction 4.  Mild neutropenia secondary to chemotherapy 5.  Anemia  6.  Malnutrition  -Lynparza on hold due to infection sepsis. Will resume when infection resolves.  -Source of infection is unclear.  Urine culture showed multiple species and was unhelpful.  Respiratory panel, influenza panel, and COVID-19 were all negative.  Has a positive blood culture and only 1 set drawn from the left antecubital.  Port-A-Cath is without redness, swelling, or tenderness today.  I do not see that a set of blood cultures was drawn from her port.  Unclear if this could be the source of infection.  She is improving with IV antibiotics.  Will defer management of antibiotics to hospitalist. -The patient has mild neutropenia and anemia secondary to her [place].  Will monitor only for now. -Continue TPN.  Dietitian is following.   LOS: 2 days   Mikey Bussing, DNP, AGPCNP-BC, AOCNP 09/12/19   Addendum  I have seen the patient, examined her. I agree with the assessment and and plan and have edited the notes.   Julie Rollins has been under my care for her stage IB pancreatic cancer, on neoadjuvant therapy. She is on TPN for nutrition support. 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 She is currently on [place] which I will hold it for now due to her infection.  She is scheduled for Whipple surgery 10/16/2019, which may be postponed dur to her current infection. I will inform her surgeon Dr. Barry Dienes.   If her port will be removed, will hold TPN for now, which increase her risk of bacteriemia. I encourage her oral intake. Antibiotics per primary team and ID, I appreciate their excellent care. I will f/u.  Truitt Merle  09/12/2019

## 2019-09-12 NOTE — Progress Notes (Addendum)
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  She is scheduled for Whipple surgery on 10/01/2019 and plan is to continue TPN for nutrition until procedure.  She presented to the ED on 10/16 with c/o fever and chills.  TPN resumed on admission.  Significant events:  10/17: patient's home TPN bag currently running; she stated that she hung bag at 1PM on 10/16; 1 of 2 blood cx bottles with [facility] 10/18: Per Dr. Lonny Prude, plan is to consult ID re: possible bacteremia and recom. for port  Today:   Glucose (goal <150): 76- 156, 8 units insulin. Sensitive scale 4 times/day, custom times for cyclic rate  Electrolytes:Na, K, Mag, CorrCa wnl; Phos repleted at 2.7; CL wnl, CO2 improved  at 23  Renal: scr ok  LFTs: AST/ALT down to wnl, Tbili ok  TGs: 81 (10/18), 63 (10/19)  Prealbumin: 23.1 (10/17), 20.1 (10/18), 16.6 (10/19)  NUTRITIONAL GOALS                                                                                             RD recs per 24 hr KCal: 2000-2200  Protein: 100 - 115 gm Fluid: 2L  Ensure Enlive: 350 kcal, 20gm protein, ordered bid, charted once 10/18 Magic Cup: 290 kcal, 9gm protein  [facility] TPN 3-in-1 formulation: - 18 hr cyclic TPN (total TPN volume 1500 mL) - AA= 104 gm - dextrose= 100 gm - lipid = 45 gm -  Total kcal/day = 1256 kcal - lytes in bag: calcium gluconate 9.6 meq, magnesium sulfate 8 meq, potassium acetate 98 mew, potassium phosphate 15 mmol, sodium acetate 77 meq - trace elements and MVI  Inpatient Custom 18 hr cyclic TPN with 99991111 ml volume provides: -  105 g/day protein (  70 g/L) -  45 g/day Lipid  ( 30  G/L) --> 37% of total TPN kcal - 105  g/day Dextrose (7 %) -  1227 Kcal/day  PLAN                                                                                                                         At 1800 today:  18 hr cyclic TPN: 44 ml/hr first and last hour, 88 ml/hr for 16 hrs  Electrolytes in TPN: Standard except K 70 meq/L, Cl:Ac ratio -- max acetate  At goal rate per PTA TPN, additional kcal per liquid diet  TPN to contain standard multivitamins and trace elements.  Add sensitive SSI q6h (3AM, 1PM, 3PM, 8PM)  CBGs: 2 hrs past cyclic TPN start + 1 hr past cycle TPN d/c'ed + middle of TPN infusion + while off TPN  TPN lab panels on Mondays & Thursdays.  BMET in am  F/u daily.   Minda Ditto PharmD 09/12/2019,6:39 AM

## 2019-09-12 NOTE — Consult Note (Addendum)
[facility]ious Disease    Date of Admission:  09/09/2019   Total days of antibiotics: 2 vanco               Reason for Consult: Port infection    Referring Provider: Lonny Prude   Assessment: Sepsis MRSE bacteremia Port Protein calorie malnutrition Pancreatic Ca 2020  [place] 09-2019 Breast Ca 2000   Plan: 1. Repeat BCx pending 2. Remove port- please call surgery.  3. Line holiday, replace port.  4. Would not send BCx from port. She has been on anbx, it will not change her plan.  5. Treat her polymicrobial ucx as a contaminant.   Thank you so much for this interesting consult,  Active Problems:   Generalized weakness   Pancreatic mass   Hypothyroidism   Port-A-Cath in place   Partial gastric outlet obstruction   Moderate protein-calorie malnutrition ([place])   Sepsis ([place])   . Chlorhexidine Gluconate Cloth  6 each Topical Daily  . enoxaparin (LOVENOX) injection  40 mg Subcutaneous Daily  . feeding supplement (ENSURE ENLIVE)  237 mL Oral BID BM  . fluticasone  1 spray Each Nare Daily  . insulin aspart  0-9 Units Subcutaneous 4 times per day  . levothyroxine  50 mcg Oral Q0600  . loratadine  10 mg Oral Daily  . mouth rinse  15 mL Mouth Rinse BID  . pantoprazole  40 mg Oral Daily  . sodium chloride flush  10-40 mL Intracatheter Q12H    HPI: Julie Rollins is a 73 y.o. female with hx of prev pancreatic (03-2019) and breast ca (2000).  She is on home TPN due to her pancreatic Ca. She is on Lynparza bid at home.  On 10-16 she hooked up her TPN and then developed temp 103. She also developed back pain.  She is now found to have MRSE bacteremia 2/2.  She is non-neutropenic.  She had a previous episode of MRSE in her 1/2 BCx 07-15-19. She was treated as RLL pna, her BCx were felt to be contaminant.   Review of Systems: Review of Systems  Constitutional: Positive for chills and fever.  Cardiovascular: Negative for chest pain.  Gastrointestinal:  Positive for abdominal pain. Negative for diarrhea and vomiting.  Genitourinary: Negative for dysuria.  Please see HPI. All other systems reviewed and negative.   Past Medical History:  Diagnosis Date  . Anxiety   . Blood transfusion without reported diagnosis   . Breast CA ([place])    s/p lumpectomy and radiation 2000  . Breast cancer ([place])   . Colitis    2007  . Dyspnea   . Dysrhythmia   . Family history of bladder cancer   . Family history of stomach cancer   . Family history of throat cancer   . Hernia, epigastric   . HTN (hypertension)    "mild hypertension", was on BP med years ago but it caused orthostatic hypotension and she has not been medicated since  . Hypothyroid   . Pancreatic cancer ([place]) dx'd 03/2019  . Personal history of breast cancer   . Personal history of radiation therapy 2001  . Poor appetite 04/2019  . Vertigo   . Vertigo     Social History   Tobacco Use  . Smoking status: Never Smoker  . Smokeless tobacco: Never Used  Substance Use Topics  . Alcohol use: No  . Drug use: No    Family History  Problem  Relation Age of Onset  . Bladder Cancer Brother   . Other Other        Denies family h/o cardiac disease  . Stomach cancer Maternal Uncle   . Throat cancer Maternal Uncle      Medications:  Scheduled: . Chlorhexidine Gluconate Cloth  6 each Topical Daily  . enoxaparin (LOVENOX) injection  40 mg Subcutaneous Daily  . feeding supplement (ENSURE ENLIVE)  237 mL Oral BID BM  . fluticasone  1 spray Each Nare Daily  . insulin aspart  0-9 Units Subcutaneous 4 times per day  . levothyroxine  50 mcg Oral Q0600  . loratadine  10 mg Oral Daily  . mouth rinse  15 mL Mouth Rinse BID  . pantoprazole  40 mg Oral Daily  . sodium chloride flush  10-40 mL Intracatheter Q12H    Abtx:  Anti-infectives (From admission, onward)   Start     Dose/Rate Route Frequency Ordered Stop   09/10/19 1000  vancomycin (VANCOCIN) IVPB 1000 mg/200 mL premix     1,000  mg 200 mL/hr over 60 Minutes Intravenous Every 24 hours 09/10/19 0425     09/10/19 0800  ceFEPIme (MAXIPIME) 2 g in sodium chloride 0.9 % 100 mL IVPB  Status:  Discontinued     2 g 200 mL/hr over 30 Minutes Intravenous Every 12 hours 09/10/19 0426 09/12/19 1027   09/10/19 0600  metroNIDAZOLE (FLAGYL) IVPB 500 mg  Status:  Discontinued     500 mg 100 mL/hr over 60 Minutes Intravenous Every 8 hours 09/10/19 0401 09/12/19 1030   09/09/19 2030  ceFEPIme (MAXIPIME) 2 g in sodium chloride 0.9 % 100 mL IVPB     2 g 200 mL/hr over 30 Minutes Intravenous  Once 09/09/19 2023 09/09/19 2145   09/09/19 2030  metroNIDAZOLE (FLAGYL) IVPB 500 mg     500 mg 100 mL/hr over 60 Minutes Intravenous  Once 09/09/19 2023 09/09/19 2322   09/09/19 2030  vancomycin (VANCOCIN) IVPB 1000 mg/200 mL premix     1,000 mg 200 mL/hr over 60 Minutes Intravenous  Once 09/09/19 2023 09/10/19 0040        OBJECTIVE: Blood pressure (!) 116/57, pulse 91, temperature 97.9 F (36.6 C), temperature source Oral, resp. rate 18, height 5\' 4"  (1.626 m), weight 61.2 kg, SpO2 98 %.  Physical Exam Constitutional:      Appearance: Normal appearance.  HENT:     Mouth/Throat:     Mouth: Mucous membranes are moist.     Pharynx: No oropharyngeal exudate.  Eyes:     Extraocular Movements: Extraocular movements intact.     Pupils: Pupils are equal, round, and reactive to light.  Neck:     Musculoskeletal: Normal range of motion and neck supple.  Cardiovascular:     Rate and Rhythm: Normal rate and regular rhythm.  Pulmonary:     Effort: Pulmonary effort is normal.     Breath sounds: Normal breath sounds.  Chest:    Abdominal:     General: Bowel sounds are normal. There is no distension.     Palpations: Abdomen is soft.     Tenderness: There is no abdominal tenderness.  Musculoskeletal:     Right lower leg: Edema present.     Left lower leg: Edema present.  Neurological:     General: No focal deficit present.     Mental  Status: She is alert.     Sensory: No sensory deficit.   trace edema  Lab Results Results  for orders placed or performed during the hospital encounter of 09/09/19 (from the past 48 hour(s))  Glucose, capillary     Status: Abnormal   Collection Time: 09/10/19  6:30 PM  Result Value Ref Range   Glucose-Capillary 147 (H) 70 - 99 mg/dL   Comment 1 Notify RN    Comment 2 Document in Chart   Glucose, capillary     Status: Abnormal   Collection Time: 09/10/19  6:55 PM  Result Value Ref Range   Glucose-Capillary 143 (H) 70 - 99 mg/dL  Glucose, capillary     Status: Abnormal   Collection Time: 09/10/19 11:13 PM  Result Value Ref Range   Glucose-Capillary 112 (H) 70 - 99 mg/dL   Comment 1 Notify RN    Comment 2 Document in Chart   Comprehensive metabolic panel     Status: Abnormal   Collection Time: 09/11/19  2:54 AM  Result Value Ref Range   Sodium 138 135 - 145 mmol/L   Potassium 3.8 3.5 - 5.1 mmol/L   Chloride 109 98 - 111 mmol/L   CO2 21 (L) 22 - 32 mmol/L   Glucose, Bld 192 (H) 70 - 99 mg/dL   BUN 23 8 - 23 mg/dL   Creatinine, Ser 0.55 0.44 - 1.00 mg/dL   Calcium 8.7 (L) 8.9 - 10.3 mg/dL   Total Protein 6.3 (L) 6.5 - 8.1 g/dL   Albumin 3.1 (L) 3.5 - 5.0 g/dL   AST 46 (H) 15 - 41 U/L   ALT 49 (H) 0 - 44 U/L   Alkaline Phosphatase 95 38 - 126 U/L   Total Bilirubin 0.4 0.3 - 1.2 mg/dL   GFR calc non Af Amer >60 >60 mL/min   GFR calc Af Amer >60 >60 mL/min   Anion gap 8 5 - 15    Comment: Performed at [facility], [place] [address], [place], [place] [address]  Prealbumin     Status: None   Collection Time: 09/11/19  2:54 AM  Result Value Ref Range   Prealbumin 20.1 18 - 38 mg/dL    Comment: Performed at [facility], [place] [address], [place], [place] [address]  Magnesium     Status: None   Collection Time: 09/11/19  2:54 AM  Result Value Ref Range   Magnesium 2.2 1.7 - 2.4 mg/dL    Comment: Performed at [facility],  [place] [address] [place], [place] [address]  Phosphorus     Status: Abnormal   Collection Time: 09/11/19  2:54 AM  Result Value Ref Range   Phosphorus 2.2 (L) 2.5 - 4.6 mg/dL    Comment: Performed at [facility], [place] [address], [place], [place] [address]  Triglycerides     Status: None   Collection Time: 09/11/19  2:54 AM  Result Value Ref Range   Triglycerides 81 <150 mg/dL    Comment: Performed at [facility], [place] [address], [place], [place] [address]  CBC     Status: Abnormal   Collection Time: 09/11/19  2:54 AM  Result Value Ref Range   WBC 4.2 4.0 - 10.5 K/uL   RBC 3.01 (L) 3.87 - 5.11 MIL/uL   Hemoglobin 9.9 (L) 12.0 - 15.0 g/dL   HCT 30.9 (L) 36.0 - 46.0 %   MCV 102.7 (H) 80.0 - 100.0 fL   MCH 32.9 26.0 - 34.0 pg   MCHC 32.0 30.0 - 36.0 g/dL   RDW 17.8 (H) 11.5 -  15.5 %   Platelets 162 150 - 400 K/uL   nRBC 0.0 0.0 - 0.2 %    Comment: Performed at [facility], [place] [address], [place], [place] [address]  Differential     Status: Abnormal   Collection Time: 09/11/19  2:54 AM  Result Value Ref Range   Neutrophils Relative % 85 %   Neutro Abs 3.6 1.7 - 7.7 K/uL   Lymphocytes Relative 7 %   Lymphs Abs 0.3 (L) 0.7 - 4.0 K/uL   Monocytes Relative 7 %   Monocytes Absolute 0.3 0.1 - 1.0 K/uL   Eosinophils Relative 0 %   Eosinophils Absolute 0.0 0.0 - 0.5 K/uL   Basophils Relative 0 %   Basophils Absolute 0.0 0.0 - 0.1 K/uL   Immature Granulocytes 1 %   Abs Immature Granulocytes 0.03 0.00 - 0.07 K/uL    Comment: Performed at [facility], [place] [address] [place], [place] [address]  Glucose, capillary     Status: Abnormal   Collection Time: 09/11/19  6:14 AM  Result Value Ref Range   Glucose-Capillary 156 (H) 70 - 99 mg/dL   Comment 1 Notify RN    Comment 2 Document in Chart   Glucose, capillary     Status: Abnormal   Collection Time: 09/11/19 12:11 PM  Result Value Ref Range    Glucose-Capillary 206 (H) 70 - 99 mg/dL  Glucose, capillary     Status: None   Collection Time: 09/11/19  3:19 PM  Result Value Ref Range   Glucose-Capillary 76 70 - 99 mg/dL  Glucose, capillary     Status: Abnormal   Collection Time: 09/11/19  8:34 PM  Result Value Ref Range   Glucose-Capillary 170 (H) 70 - 99 mg/dL  Glucose, capillary     Status: Abnormal   Collection Time: 09/12/19  3:07 AM  Result Value Ref Range   Glucose-Capillary 132 (H) 70 - 99 mg/dL  Comprehensive metabolic panel     Status: Abnormal   Collection Time: 09/12/19  5:29 AM  Result Value Ref Range   Sodium 139 135 - 145 mmol/L   Potassium 4.4 3.5 - 5.1 mmol/L   Chloride 109 98 - 111 mmol/L   CO2 23 22 - 32 mmol/L   Glucose, Bld 149 (H) 70 - 99 mg/dL   BUN 28 (H) 8 - 23 mg/dL   Creatinine, Ser 0.46 0.44 - 1.00 mg/dL   Calcium 8.8 (L) 8.9 - 10.3 mg/dL   Total Protein 5.8 (L) 6.5 - 8.1 g/dL   Albumin 2.8 (L) 3.5 - 5.0 g/dL   AST 29 15 - 41 U/L   ALT 36 0 - 44 U/L   Alkaline Phosphatase 85 38 - 126 U/L   Total Bilirubin 0.1 (L) 0.3 - 1.2 mg/dL   GFR calc non Af Amer >60 >60 mL/min   GFR calc Af Amer >60 >60 mL/min   Anion gap 7 5 - 15    Comment: Performed at [facility], [place] [address], [place], [place] [address]  Magnesium     Status: None   Collection Time: 09/12/19  5:29 AM  Result Value Ref Range   Magnesium 2.2 1.7 - 2.4 mg/dL    Comment: Performed at [facility], [place] [address], [place],  [address]  Phosphorus     Status: None   Collection Time: 09/12/19  5:29 AM  Result Value Ref Range   Phosphorus 2.7 2.5 - 4.6 mg/dL  Comment: Performed at [facility], [place] [address] [place] [address] [place], [place] [address]  CBC     Status: Abnormal   Collection Time: 09/12/19  5:29 AM  Result Value Ref Range   WBC 2.4 (L) 4.0 - 10.5 K/uL   RBC 2.93 (L) 3.87 - 5.11 MIL/uL   Hemoglobin 9.6 (L) 12.0 - 15.0 g/dL   HCT 30.5 (L) 36.0 - 46.0 %   MCV 104.1  (H) 80.0 - 100.0 fL   MCH 32.8 26.0 - 34.0 pg   MCHC 31.5 30.0 - 36.0 g/dL   RDW 17.8 (H) 11.5 - 15.5 %   Platelets 151 150 - 400 K/uL   nRBC 0.0 0.0 - 0.2 %    Comment: Performed at [facility], [place] [address], [place], [place] [address]  Differential     Status: Abnormal   Collection Time: 09/12/19  5:29 AM  Result Value Ref Range   Neutrophils Relative % 58 %   Neutro Abs 1.4 (L) 1.7 - 7.7 K/uL   Lymphocytes Relative 22 %   Lymphs Abs 0.5 (L) 0.7 - 4.0 K/uL   Monocytes Relative 13 %   Monocytes Absolute 0.3 0.1 - 1.0 K/uL   Eosinophils Relative 6 %   Eosinophils Absolute 0.1 0.0 - 0.5 K/uL   Basophils Relative 1 %   Basophils Absolute 0.0 0.0 - 0.1 K/uL   Immature Granulocytes 0 %   Abs Immature Granulocytes 0.00 0.00 - 0.07 K/uL    Comment: Performed at [facility], [place] [address] [place], [place] [address]  Triglycerides     Status: None   Collection Time: 09/12/19  5:29 AM  Result Value Ref Range   Triglycerides 63 <150 mg/dL    Comment: Performed at [facility], [place] [address], [place], [place] [address]  Prealbumin     Status: Abnormal   Collection Time: 09/12/19  5:29 AM  Result Value Ref Range   Prealbumin 16.6 (L) 18 - 38 mg/dL    Comment: Performed at [facility], [place] [address], [place], [place] [address]  Glucose, capillary     Status: Abnormal   Collection Time: 09/12/19  7:53 AM  Result Value Ref Range   Glucose-Capillary 158 (H) 70 - 99 mg/dL   Comment 1 Notify RN    Comment 2 Document in Chart   Glucose, capillary     Status: Abnormal   Collection Time: 09/12/19 12:53 PM  Result Value Ref Range   Glucose-Capillary 158 (H) 70 - 99 mg/dL   Comment 1 Notify RN    Comment 2 Document in Chart       Component Value Date/Time   SDES  09/09/2019 2039    IN/OUT CATH URINE Performed at [facility], [place] [address] [place], [place] [address]    [place]   09/09/2019 2039    NONE Performed at [facility], [place] [address], [place],  [address]    CULT (A) 09/09/2019 2039    60,000 COLONIES/mL MULTIPLE SPECIES PRESENT, SUGGEST RECOLLECTION   REPTSTATUS 09/11/2019 FINAL 09/09/2019 2039   Ct Maxillofacial Wo Contrast  Result Date: 09/11/2019 CLINICAL DATA:  Fever of unknown origin. Question sinusitis. EXAM: CT MAXILLOFACIAL WITHOUT CONTRAST TECHNIQUE: Multidetector CT imaging of the maxillofacial structures was performed. Multiplanar CT image reconstructions were also generated. COMPARISON:  None. FINDINGS: Osseous: No focal lytic or blastic lesion is present. There is no acute or healing fracture. Orbits: Bilateral lens replacements  are noted. Globes and orbits are otherwise unremarkable. Sinuses: The paranasal sinuses and mastoid air cells are clear. Soft tissues: Soft tissues the face are unremarkable. Limited intracranial: Within normal limits IMPRESSION: Negative CT of the face. Electronically Signed   By: San Morelle M.D.   On: 09/11/2019 19:01   Recent Results (from the past 240 hour(s))  Blood Culture (routine x 2)     Status: Abnormal   Collection Time: 09/09/19  8:28 PM   Specimen: Line, Central; Blood  Result Value Ref Range Status   Specimen Description   Final    BLOOD LEFT ANTECUBITAL Performed at [facility], [place] [address] [place], [place] [address]    Special Requests   Final    BOTTLES DRAWN AEROBIC AND ANAEROBIC Blood Culture results may not be optimal due to an excessive volume of blood received in culture bottles Performed at [place] [address] [place], [place] [address]    Culture  Setup Time   Final    GRAM POSITIVE COCCI IN BOTH AEROBIC AND ANAEROBIC BOTTLES CRITICAL RESULT CALLED TO, READ BACK BY AND VERIFIED WITH: PHARMD A PHAM [id number] AT 78 BY CM Performed at [facility], [place] [address] [place], [place] [address]    Culture  STAPHYLOCOCCUS EPIDERMIDIS (A)  Final   Report Status 09/12/2019 FINAL  Final   Organism ID, Bacteria STAPHYLOCOCCUS EPIDERMIDIS  Final      Susceptibility   Staphylococcus epidermidis - MIC*    CIPROFLOXACIN >=8 RESISTANT Resistant     ERYTHROMYCIN >=8 RESISTANT Resistant     GENTAMICIN <=0.5 SENSITIVE Sensitive     OXACILLIN >=4 RESISTANT Resistant     TETRACYCLINE 2 SENSITIVE Sensitive     VANCOMYCIN 1 SENSITIVE Sensitive     TRIMETH/SULFA 80 RESISTANT Resistant     CLINDAMYCIN RESISTANT Resistant     RIFAMPIN <=0.5 SENSITIVE Sensitive     Inducible Clindamycin POSITIVE Resistant     * STAPHYLOCOCCUS EPIDERMIDIS  Urine culture     Status: Abnormal   Collection Time: 09/09/19  8:39 PM   Specimen: In/Out Cath Urine  Result Value Ref Range Status   Specimen Description   Final    IN/OUT CATH URINE Performed at [facility], [place] [address] [place], [place] [address]    Special Requests   Final    NONE Performed at [facility], [place] [address], [place], [place] [address]    Culture (A)  Final    60,000 COLONIES/mL MULTIPLE SPECIES PRESENT, SUGGEST RECOLLECTION   Report Status 09/11/2019 FINAL  Final  SARS CORONAVIRUS 2 (TAT 6-24 HRS) Nasopharyngeal Nasopharyngeal Swab     Status: None   Collection Time: 09/09/19  8:53 PM   Specimen: Nasopharyngeal Swab  Result Value Ref Range Status   SARS Coronavirus 2 NEGATIVE NEGATIVE Final    Comment: (NOTE) SARS-CoV-2 target nucleic acids are NOT DETECTED. The SARS-CoV-2 RNA is generally detectable in upper and lower respiratory specimens during the acute phase of infection. Negative results do not preclude SARS-CoV-2 infection, do not rule out co-infections with other pathogens, and should not be used as the sole basis for treatment or other patient management decisions. Negative results must be combined with clinical observations, patient history, and epidemiological information. The expected  result is Negative. Fact Sheet for Patients: SugarRoll.be Fact Sheet for Healthcare Providers: https://www.woods-mathews.com/ This test is not yet approved or cleared by the [place] FDA and  has been authorized for  detection and/or diagnosis of SARS-CoV-2 by FDA under an Emergency Use Authorization (EUA). This EUA will remain  in effect (meaning this test can be used) for the duration of the COVID-19 declaration under Section 56 4(b)(1) of the Act, 21 U.S.C. section 360bbb-3(b)(1), unless the authorization is terminated or revoked sooner. Performed at [facility], [place] [address], [place], [place] [address]   MRSA PCR Screening     Status: None   Collection Time: 09/09/19 11:58 PM   Specimen: Nasal Mucosa; Nasopharyngeal  Result Value Ref Range Status   MRSA by PCR NEGATIVE NEGATIVE Final    Comment:        The GeneXpert MRSA Assay (FDA approved for NASAL specimens only), is one component of a comprehensive MRSA colonization surveillance program. It is not intended to diagnose MRSA infection nor to guide or monitor treatment for MRSA infections. Performed at [facility], [place] [address], [place], [place] [address]   Respiratory Panel by PCR     Status: None   Collection Time: 09/10/19 10:00 AM   Specimen: Nasopharyngeal Swab; Respiratory  Result Value Ref Range Status   Adenovirus NOT DETECTED NOT DETECTED Final   Coronavirus 229E NOT DETECTED NOT DETECTED Final    Comment: (NOTE) The Coronavirus on the Respiratory Panel, DOES NOT test for the novel  Coronavirus (2019 nCoV)    Coronavirus HKU1 NOT DETECTED NOT DETECTED Final   Coronavirus NL63 NOT DETECTED NOT DETECTED Final   Coronavirus OC43 NOT DETECTED NOT DETECTED Final   Metapneumovirus NOT DETECTED NOT DETECTED Final   Rhinovirus / Enterovirus NOT DETECTED NOT DETECTED Final   Influenza A NOT DETECTED NOT DETECTED Final   Influenza B NOT  DETECTED NOT DETECTED Final   Parainfluenza Virus 1 NOT DETECTED NOT DETECTED Final   Parainfluenza Virus 2 NOT DETECTED NOT DETECTED Final   Parainfluenza Virus 3 NOT DETECTED NOT DETECTED Final   Parainfluenza Virus 4 NOT DETECTED NOT DETECTED Final   Respiratory Syncytial Virus NOT DETECTED NOT DETECTED Final   Bordetella pertussis NOT DETECTED NOT DETECTED Final   Chlamydophila pneumoniae NOT DETECTED NOT DETECTED Final   Mycoplasma pneumoniae NOT DETECTED NOT DETECTED Final    Comment: Performed at [facility], [address], [place], [place] [address]    Microbiology: Recent Results (from the past 240 hour(s))  Blood Culture (routine x 2)     Status: Abnormal   Collection Time: 09/09/19  8:28 PM   Specimen: Line, Central; Blood  Result Value Ref Range Status   Specimen Description   Final    BLOOD LEFT ANTECUBITAL Performed at [facility], [place] [address], [place], [place] [address]    Special Requests   Final    BOTTLES DRAWN AEROBIC AND ANAEROBIC Blood Culture results may not be optimal due to an excessive volume of blood received in culture bottles Performed at [place] [address], [place], [place] [address]    Culture  Setup Time   Final    GRAM POSITIVE COCCI IN BOTH AEROBIC AND ANAEROBIC BOTTLES CRITICAL RESULT CALLED TO, READ BACK BY AND VERIFIED WITH: PHARMD A PHAM [id number] AT 8 BY CM Performed at [facility], [place] [address] [place], [place] [address]    Culture STAPHYLOCOCCUS EPIDERMIDIS (A)  Final   Report Status 09/12/2019 FINAL  Final   Organism ID, Bacteria STAPHYLOCOCCUS EPIDERMIDIS  Final      Susceptibility   Staphylococcus epidermidis - MIC*  CIPROFLOXACIN >=8 RESISTANT Resistant     ERYTHROMYCIN >=8 RESISTANT Resistant     GENTAMICIN <=0.5 SENSITIVE Sensitive     OXACILLIN >=4 RESISTANT Resistant     TETRACYCLINE 2 SENSITIVE Sensitive     VANCOMYCIN 1 SENSITIVE Sensitive     TRIMETH/SULFA 80  RESISTANT Resistant     CLINDAMYCIN RESISTANT Resistant     RIFAMPIN <=0.5 SENSITIVE Sensitive     Inducible Clindamycin POSITIVE Resistant     * STAPHYLOCOCCUS EPIDERMIDIS  Urine culture     Status: Abnormal   Collection Time: 09/09/19  8:39 PM   Specimen: In/Out Cath Urine  Result Value Ref Range Status   Specimen Description   Final    IN/OUT CATH URINE Performed at [place] [address], [place], [place] [address]    Special Requests   Final    NONE Performed at [facility], [place] [address] [place], [place] [address]    Culture (A)  Final    60,000 COLONIES/mL MULTIPLE SPECIES PRESENT, SUGGEST RECOLLECTION   Report Status 09/11/2019 FINAL  Final  SARS CORONAVIRUS 2 (TAT 6-24 HRS) Nasopharyngeal Nasopharyngeal Swab     Status: None   Collection Time: 09/09/19  8:53 PM   Specimen: Nasopharyngeal Swab  Result Value Ref Range Status   SARS Coronavirus 2 NEGATIVE NEGATIVE Final    Comment: (NOTE) SARS-CoV-2 target nucleic acids are NOT DETECTED. The SARS-CoV-2 RNA is generally detectable in upper and lower respiratory specimens during the acute phase of infection. Negative results do not preclude SARS-CoV-2 infection, do not rule out co-infections with other pathogens, and should not be used as the sole basis for treatment or other patient management decisions. Negative results must be combined with clinical observations, patient history, and epidemiological information. The expected result is Negative. Fact Sheet for Patients: SugarRoll.be Fact Sheet for Healthcare Providers: https://www.woods-mathews.com/ This test is not yet approved or cleared by the [place] FDA and  has been authorized for detection and/or diagnosis of SARS-CoV-2 by FDA under an Emergency Use Authorization (EUA). This EUA will remain  in effect (meaning this test can be used) for the duration of the COVID-19  declaration under Section 56 4(b)(1) of the Act, 21 U.S.C. section 360bbb-3(b)(1), unless the authorization is terminated or revoked sooner. Performed at [facility], [place] [address] [place], [place] [address]   MRSA PCR Screening     Status: None   Collection Time: 09/09/19 11:58 PM   Specimen: Nasal Mucosa; Nasopharyngeal  Result Value Ref Range Status   MRSA by PCR NEGATIVE NEGATIVE Final    Comment:        The GeneXpert MRSA Assay (FDA approved for NASAL specimens only), is one component of a comprehensive MRSA colonization surveillance program. It is not intended to diagnose MRSA infection nor to guide or monitor treatment for MRSA infections. Performed at [facility], [place] [address] [place], [place] [address]   Respiratory Panel by PCR     Status: None   Collection Time: 09/10/19 10:00 AM   Specimen: Nasopharyngeal Swab; Respiratory  Result Value Ref Range Status   Adenovirus NOT DETECTED NOT DETECTED Final   Coronavirus 229E NOT DETECTED NOT DETECTED Final    Comment: (NOTE) The Coronavirus on the Respiratory Panel, DOES NOT test for the novel  Coronavirus (2019 nCoV)    Coronavirus HKU1 NOT DETECTED NOT DETECTED Final   Coronavirus NL63 NOT DETECTED NOT DETECTED Final   Coronavirus OC43 NOT DETECTED NOT  DETECTED Final   Metapneumovirus NOT DETECTED NOT DETECTED Final   Rhinovirus / Enterovirus NOT DETECTED NOT DETECTED Final   Influenza A NOT DETECTED NOT DETECTED Final   Influenza B NOT DETECTED NOT DETECTED Final   Parainfluenza Virus 1 NOT DETECTED NOT DETECTED Final   Parainfluenza Virus 2 NOT DETECTED NOT DETECTED Final   Parainfluenza Virus 3 NOT DETECTED NOT DETECTED Final   Parainfluenza Virus 4 NOT DETECTED NOT DETECTED Final   Respiratory Syncytial Virus NOT DETECTED NOT DETECTED Final   Bordetella pertussis NOT DETECTED NOT DETECTED Final   Chlamydophila pneumoniae NOT DETECTED NOT DETECTED Final   Mycoplasma pneumoniae  NOT DETECTED NOT DETECTED Final    Comment: Performed at [facility], [place] [address] [place], [place] [address]    Radiographs and labs were personally reviewed by me.   Bobby Rumpf, MD [facility] [phone] 09/12/2019, 4:20 PM

## 2019-09-12 NOTE — Plan of Care (Signed)

## 2019-09-12 NOTE — Evaluation (Signed)
Occupational Therapy Evaluation Patient Details Name: Julie Rollins MRN: [id number] DOB: January 12, 1946 Today's Date: 09/12/2019    History of Present Illness 73 y.o. female with medical history significant of pancreatic cancer, breast cancer status post lumpectomy and radiation, hypertension, hypothyroidism admitted with fever, chills. Dx of sepsis. Whipple procedure planned for November.   Clinical Impression   Pt seen for OT evaluation this date. Prior to hospital admission, pt reports being Indep with use of rollator for fxl mobility only when fatigued.  Pt lives in apartment with level entry.  Currently pt demonstrates impairments in standing balance and tolerance requiring MIN assist for LB ADLs and toileting as well as CGA-MIN A for fxl mobility.  Pt would benefit from skilled OT to address noted impairments and functional limitations (see below for any additional details) in order to maximize safety and independence while minimizing falls risk and caregiver burden.  Upon hospital discharge, recommend pt discharge to home with [place].    Follow Up Recommendations  Home health OT    Equipment Recommendations  3 in 1 bedside commode    Recommendations for Other Services       Precautions / Restrictions Precautions Precautions: Fall Restrictions Weight Bearing Restrictions: No      Mobility Bed Mobility Overal bed mobility: Modified Independent                Transfers Overall transfer level: Needs assistance Equipment used: Rolling walker (2 wheeled) Transfers: Sit to/from Stand Sit to Stand: Min assist              Balance Overall balance assessment: Needs assistance Sitting-balance support: No upper extremity supported;Feet supported Sitting balance-Leahy Scale: Good     Standing balance support: Bilateral upper extremity supported Standing balance-Leahy Scale: Fair                             ADL either performed or assessed with clinical  judgement   ADL Overall ADL's : Needs assistance/impaired Eating/Feeding: Independent;Sitting   Grooming: Wash/dry hands;Wash/dry face;Standing;Min guard;Supervision/safety Grooming Details (indicate cue type and reason): standing sink-side with RW to wash hands/face. Upper Body Bathing: Set up;Supervision/ safety;Sitting   Lower Body Bathing: Min guard;Minimal assistance;Sit to/from stand   Upper Body Dressing : Set up;Independent   Lower Body Dressing: Minimal assistance;Sit to/from stand   Toilet Transfer: Minimal assistance;Grab bars;RW   Toileting- Water quality scientist and Hygiene: Min guard;Minimal assistance;Sit to/from stand   Tub/ Shower Transfer: Minimal assistance;Grab bars;Rolling walker   Functional mobility during ADLs: Supervision/safety;Min guard;Rolling walker       Vision Baseline Vision/History: Wears glasses Wears Glasses: At all times Patient Visual Report: No change from baseline       Perception     Praxis      Pertinent Vitals/Pain Pain Assessment: No/denies pain     Hand Dominance     Extremity/Trunk Assessment Upper Extremity Assessment Upper Extremity Assessment: Generalized weakness   Lower Extremity Assessment Lower Extremity Assessment: Generalized weakness       Communication Communication Communication: No difficulties   Cognition Arousal/Alertness: Awake/alert Behavior During Therapy: Impulsive;WFL for tasks assessed/performed Overall Cognitive Status: Within Functional Limits for tasks assessed                                 General Comments: some potential lack of insight into deficits. Pt declines to use RW to amb to restroom initially,  but is noted to have F-/P+ standing balance requiring RW and cues from OT for safety.   General Comments       Exercises Other Exercises Other Exercises: OT facilitates education with pt re: general fall prevention, use of call light, bed alarm awareness, avoiding  environmental obstacles/scanning environment for safety with fxl mobility. Pt verbalized understanding, but needs reinforcement.   Shoulder Instructions      Home Living Family/patient expects to be discharged to:: Private residence   Available Help at Discharge: Friend(s);Available PRN/intermittently Type of Home: Apartment Home Access: Level entry     Home Layout: One level     Bathroom Shower/Tub: Tub/shower unit         Home Equipment: Grab bars - tub/shower;Walker - 4 wheels          Prior Functioning/Environment Level of Independence: Independent        Comments: Pt lives alone and is independent at baseline. She is currently limited by fatigue with mobilty and ADL's. Pt is independent with gait and transfers wtih no device and uses rollator if fatigued, she denies falls in last 6 months        OT Problem List: Decreased strength;Decreased activity tolerance;Impaired balance (sitting and/or standing)      OT Treatment/Interventions: Self-care/ADL training;Therapeutic exercise;Energy conservation;DME and/or AE instruction;Therapeutic activities;Patient/family education;Balance training    OT Goals(Current goals can be found in the care plan section) Acute Rehab OT Goals Patient Stated Goal: HOME OT Goal Formulation: With patient Time For Goal Achievement: 09/26/19 Potential to Achieve Goals: Good  OT Frequency: Min 2X/week   Barriers to D/C:            Co-evaluation              AM-PAC OT "6 Clicks" Daily Activity     Outcome Measure Help from another person eating meals?: None Help from another person taking care of personal grooming?: A Little Help from another person toileting, which includes using toliet, bedpan, or urinal?: A Little Help from another person bathing (including washing, rinsing, drying)?: A Little Help from another person to put on and taking off regular upper body clothing?: None Help from another person to put on and taking  off regular lower body clothing?: A Little 6 Click Score: 20   End of Session Equipment Utilized During Treatment: Gait belt;Rolling walker Nurse Communication: (notified pt needs new PureWick)  Activity Tolerance: Patient tolerated treatment well Patient left: in bed;with call bell/phone within reach;with bed alarm set  OT Visit Diagnosis: Unsteadiness on feet (R26.81);Muscle weakness (generalized) (M62.81)                Time: [id number] OT Time Calculation (min): 38 min Charges:  OT General Charges $OT Visit: 1 Visit OT Evaluation $OT Eval Low Complexity: 1 Low OT Treatments $Self Care/Home Management : 8-22 mins $Therapeutic Activity: 8-22 mins  Gerrianne Scale, MS, OTR/L Pager [phone] 09/12/19, 5:29 PM

## 2019-09-12 NOTE — Progress Notes (Signed)
Dr. Johnnye Sima stated to not draw blood culture from port. Will continue to monitor.

## 2019-09-13 ENCOUNTER — Inpatient Hospital Stay (HOSPITAL_COMMUNITY): Payer: Medicare HMO | Admitting: Certified Registered Nurse Anesthetist

## 2019-09-13 ENCOUNTER — Encounter (HOSPITAL_COMMUNITY)
Admission: EM | Disposition: A | Discharge status: Home / Self Care | Payer: Self-pay | Source: Home / Self Care | Attending: Family Medicine

## 2019-09-13 ENCOUNTER — Encounter (HOSPITAL_COMMUNITY): Payer: Self-pay

## 2019-09-13 HISTORY — PX: PORT-A-CATH REMOVAL: SHX5289

## 2019-09-13 LAB — CBC WITH DIFFERENTIAL/PLATELET
Abs Immature Granulocytes: 0.02 10*3/uL (ref 0.00–0.07)
Basophils Absolute: 0 10*3/uL (ref 0.0–0.1)
Basophils Relative: 0 %
Eosinophils Absolute: 0.1 10*3/uL (ref 0.0–0.5)
Eosinophils Relative: 1 %
HCT: 30.9 % — ABNORMAL LOW (ref 36.0–46.0)
Hemoglobin: 9.8 g/dL — ABNORMAL LOW (ref 12.0–15.0)
Immature Granulocytes: 1 %
Lymphocytes Relative: 10 %
Lymphs Abs: 0.4 10*3/uL — ABNORMAL LOW (ref 0.7–4.0)
MCH: 32.6 pg (ref 26.0–34.0)
MCHC: 31.7 g/dL (ref 30.0–36.0)
MCV: 102.7 fL — ABNORMAL HIGH (ref 80.0–100.0)
Monocytes Absolute: 0.2 10*3/uL (ref 0.1–1.0)
Monocytes Relative: 4 %
Neutro Abs: 3.5 10*3/uL (ref 1.7–7.7)
Neutrophils Relative %: 84 %
Platelets: 183 10*3/uL (ref 150–400)
RBC: 3.01 MIL/uL — ABNORMAL LOW (ref 3.87–5.11)
RDW: 18.1 % — ABNORMAL HIGH (ref 11.5–15.5)
WBC: 4.2 10*3/uL (ref 4.0–10.5)
nRBC: 0 % (ref 0.0–0.2)

## 2019-09-13 LAB — BASIC METABOLIC PANEL
Anion gap: 8 (ref 5–15)
BUN: 26 mg/dL — ABNORMAL HIGH (ref 8–23)
CO2: 24 mmol/L (ref 22–32)
Calcium: 9 mg/dL (ref 8.9–10.3)
Chloride: 108 mmol/L (ref 98–111)
Creatinine, Ser: 0.55 mg/dL (ref 0.44–1.00)
GFR calc Af Amer: >60 mL/min (ref >60)
GFR calc non Af Amer: >60 mL/min (ref >60)
Glucose, Bld: 122 mg/dL — ABNORMAL HIGH (ref 70–99)
Potassium: 4.8 mmol/L (ref 3.5–5.1)
Sodium: 140 mmol/L (ref 135–145)

## 2019-09-13 LAB — COMPREHENSIVE METABOLIC PANEL
ALT: 29 U/L (ref 0–44)
AST: 23 U/L (ref 15–41)
Albumin: 3.4 g/dL — ABNORMAL LOW (ref 3.5–5.0)
Alkaline Phosphatase: 88 U/L (ref 38–126)
Anion gap: 8 (ref 5–15)
BUN: 32 mg/dL — ABNORMAL HIGH (ref 8–23)
CO2: 25 mmol/L (ref 22–32)
Calcium: 9 mg/dL (ref 8.9–10.3)
Chloride: 105 mmol/L (ref 98–111)
Creatinine, Ser: 0.54 mg/dL (ref 0.44–1.00)
GFR calc Af Amer: >60 mL/min (ref >60)
GFR calc non Af Amer: >60 mL/min (ref >60)
Glucose, Bld: 120 mg/dL — ABNORMAL HIGH (ref 70–99)
Potassium: 4.2 mmol/L (ref 3.5–5.1)
Sodium: 138 mmol/L (ref 135–145)
Total Bilirubin: 0.7 mg/dL (ref 0.3–1.2)
Total Protein: 6.6 g/dL (ref 6.5–8.1)

## 2019-09-13 LAB — PREALBUMIN: Prealbumin: 23.3 mg/dL (ref 18–38)

## 2019-09-13 LAB — GLUCOSE, CAPILLARY
Glucose-Capillary: 144 mg/dL — ABNORMAL HIGH (ref 70–99)
Glucose-Capillary: 174 mg/dL — ABNORMAL HIGH (ref 70–99)

## 2019-09-13 SURGERY — REMOVAL PORT-A-CATH
Anesthesia: General | Site: Chest

## 2019-09-13 MED ORDER — MIDAZOLAM HCL 5 MG/5ML IJ SOLN
INTRAMUSCULAR | Status: DC | PRN
  Administered 2019-09-13 (×2): 1 mg via INTRAVENOUS

## 2019-09-13 MED ORDER — PROMETHAZINE HCL 25 MG/ML IJ SOLN: 6.2500 mg | INTRAMUSCULAR | Status: DC | PRN

## 2019-09-13 MED ORDER — INSULIN ASPART 100 UNIT/ML ~~LOC~~ SOLN
0.0000 [IU] | Freq: Three times a day (TID) | SUBCUTANEOUS | Status: DC
  Administered 2019-09-13 – 2019-09-15 (×2): 2 [IU] via SUBCUTANEOUS
  Administered 2019-09-15 – 2019-09-17 (×4): 1 [IU] via SUBCUTANEOUS

## 2019-09-13 MED ORDER — CHLORHEXIDINE GLUCONATE CLOTH 2 % EX PADS: 6.0000 | MEDICATED_PAD | Freq: Once | CUTANEOUS | Status: DC

## 2019-09-13 MED ORDER — LIDOCAINE-EPINEPHRINE 1 %-1:100000 IJ SOLN
INTRAMUSCULAR | Status: AC
  Filled 2019-09-13: qty 1

## 2019-09-13 MED ORDER — LIDOCAINE 2% (20 MG/ML) 5 ML SYRINGE
INTRAMUSCULAR | Status: DC | PRN
  Administered 2019-09-13: 60 mg via INTRAVENOUS

## 2019-09-13 MED ORDER — BUPIVACAINE HCL (PF) 0.25 % IJ SOLN
INTRAMUSCULAR | Status: AC
  Filled 2019-09-13: qty 30

## 2019-09-13 MED ORDER — FENTANYL CITRATE (PF) 100 MCG/2ML IJ SOLN
INTRAMUSCULAR | Status: AC
  Filled 2019-09-13: qty 2

## 2019-09-13 MED ORDER — OXYCODONE HCL 5 MG/5ML PO SOLN: 5.0000 mg | Freq: Once | ORAL | Status: DC | PRN

## 2019-09-13 MED ORDER — LIDOCAINE HCL (PF) 1 % IJ SOLN
INTRAMUSCULAR | Status: AC
  Filled 2019-09-13: qty 30

## 2019-09-13 MED ORDER — GABAPENTIN 300 MG PO CAPS: 300.0000 mg | ORAL_CAPSULE | ORAL | Status: DC

## 2019-09-13 MED ORDER — LIDOCAINE 2% (20 MG/ML) 5 ML SYRINGE
INTRAMUSCULAR | Status: AC
  Filled 2019-09-13: qty 5

## 2019-09-13 MED ORDER — PROPOFOL 10 MG/ML IV BOLUS
INTRAVENOUS | Status: AC
  Filled 2019-09-13: qty 20

## 2019-09-13 MED ORDER — LACTATED RINGERS IV SOLN
INTRAVENOUS | Status: DC
  Administered 2019-09-13: 12:00:00 via INTRAVENOUS

## 2019-09-13 MED ORDER — FENTANYL CITRATE (PF) 100 MCG/2ML IJ SOLN
INTRAMUSCULAR | Status: DC | PRN
  Administered 2019-09-13: 50 ug via INTRAVENOUS
  Administered 2019-09-13 (×2): 25 ug via INTRAVENOUS

## 2019-09-13 MED ORDER — OXYCODONE HCL 5 MG PO TABS: 5.0000 mg | ORAL_TABLET | Freq: Once | ORAL | Status: DC | PRN

## 2019-09-13 MED ORDER — HYDROMORPHONE HCL 1 MG/ML IJ SOLN: 0.2500 mg | INTRAMUSCULAR | Status: DC | PRN

## 2019-09-13 MED ORDER — DEXAMETHASONE SODIUM PHOSPHATE 10 MG/ML IJ SOLN
INTRAMUSCULAR | Status: AC
  Filled 2019-09-13: qty 1

## 2019-09-13 MED ORDER — CEFAZOLIN SODIUM-DEXTROSE 2-4 GM/100ML-% IV SOLN: 2.0000 g | INTRAVENOUS | Status: DC

## 2019-09-13 MED ORDER — PROPOFOL 10 MG/ML IV BOLUS
INTRAVENOUS | Status: DC | PRN
  Administered 2019-09-13: 90 mg via INTRAVENOUS

## 2019-09-13 MED ORDER — MIDAZOLAM HCL 2 MG/2ML IJ SOLN
INTRAMUSCULAR | Status: AC
  Filled 2019-09-13: qty 2

## 2019-09-13 MED ORDER — PRO-STAT SUGAR FREE PO LIQD
30.0000 mL | Freq: Two times a day (BID) | ORAL | Status: DC
  Administered 2019-09-13 – 2019-09-16 (×5): 30 mL via ORAL
  Filled 2019-09-13 (×8): qty 30

## 2019-09-13 MED ORDER — ACETAMINOPHEN 500 MG PO TABS: 1000.0000 mg | ORAL_TABLET | ORAL | Status: DC

## 2019-09-13 MED ORDER — INSULIN ASPART 100 UNIT/ML ~~LOC~~ SOLN: 0.0000 [IU] | Freq: Four times a day (QID) | SUBCUTANEOUS | Status: DC

## 2019-09-13 MED ORDER — DEXAMETHASONE SODIUM PHOSPHATE 4 MG/ML IJ SOLN
INTRAMUSCULAR | Status: DC | PRN
  Administered 2019-09-13: 5 mg via INTRAVENOUS

## 2019-09-13 MED ORDER — ONDANSETRON HCL 4 MG/2ML IJ SOLN
INTRAMUSCULAR | Status: AC
  Filled 2019-09-13: qty 2

## 2019-09-13 MED ORDER — SODIUM CHLORIDE 0.9 % IR SOLN
Status: DC | PRN
  Administered 2019-09-13: 1000 mL

## 2019-09-13 SURGICAL SUPPLY — 40 items
ADH SKN CLS APL DERMABOND .7 (GAUZE/BANDAGES/DRESSINGS) ×1
APL PRP STRL LF DISP 70% ISPRP (MISCELLANEOUS) ×1
APL SKNCLS STERI-STRIP NONHPOA (GAUZE/BANDAGES/DRESSINGS) ×1
BENZOIN TINCTURE PRP APPL 2/3 (GAUZE/BANDAGES/DRESSINGS) ×3 IMPLANT
BLADE SURG 15 STRL LF DISP TIS (BLADE) ×1 IMPLANT
BLADE SURG 15 STRL SS (BLADE) ×3
CHLORAPREP W/TINT 26 (MISCELLANEOUS) ×3 IMPLANT
CLOSURE WOUND 1/2 X4 (GAUZE/BANDAGES/DRESSINGS) ×1
COVER SURGICAL LIGHT HANDLE (MISCELLANEOUS) ×3 IMPLANT
COVER WAND RF STERILE (DRAPES) IMPLANT
DECANTER SPIKE VIAL GLASS SM (MISCELLANEOUS) ×3 IMPLANT
DERMABOND ADVANCED (GAUZE/BANDAGES/DRESSINGS) ×2
DERMABOND ADVANCED .7 DNX12 (GAUZE/BANDAGES/DRESSINGS) IMPLANT
DRAPE LAPAROTOMY TRNSV 102X78 (DRAPES) IMPLANT
ELECT COATED BLADE 2.86 ST (ELECTRODE) IMPLANT
ELECT PENCIL ROCKER SW 15FT (MISCELLANEOUS) ×3 IMPLANT
ELECT REM PT RETURN 15FT ADLT (MISCELLANEOUS) ×3 IMPLANT
GAUZE 4X4 16PLY RFD (DISPOSABLE) IMPLANT
GAUZE SPONGE 4X4 12PLY STRL (GAUZE/BANDAGES/DRESSINGS) ×3 IMPLANT
GLOVE BIO SURGEON STRL SZ 6 (GLOVE) ×3 IMPLANT
GLOVE BIOGEL PI IND STRL 6.5 (GLOVE) ×1 IMPLANT
GLOVE BIOGEL PI IND STRL 7.0 (GLOVE) ×1 IMPLANT
GLOVE BIOGEL PI INDICATOR 6.5 (GLOVE) ×2
GLOVE BIOGEL PI INDICATOR 7.0 (GLOVE) ×2
GLOVE INDICATOR 6.5 STRL GRN (GLOVE) ×6 IMPLANT
GOWN STRL REUS W/TWL 2XL LVL3 (GOWN DISPOSABLE) ×3 IMPLANT
GOWN STRL REUS W/TWL XL LVL3 (GOWN DISPOSABLE) ×9 IMPLANT
KIT BASIN OR (CUSTOM PROCEDURE TRAY) ×3 IMPLANT
KIT TURNOVER KIT A (KITS) IMPLANT
MARKER SKIN DUAL TIP RULER LAB (MISCELLANEOUS) ×3 IMPLANT
NDL HYPO 25X1 1.5 SAFETY (NEEDLE) ×1 IMPLANT
NEEDLE HYPO 22GX1.5 SAFETY (NEEDLE) IMPLANT
NEEDLE HYPO 25X1 1.5 SAFETY (NEEDLE) ×3 IMPLANT
PACK BASIC VI WITH GOWN DISP (CUSTOM PROCEDURE TRAY) ×3 IMPLANT
STRIP CLOSURE SKIN 1/2X4 (GAUZE/BANDAGES/DRESSINGS) ×2 IMPLANT
SUT MNCRL AB 4-0 PS2 18 (SUTURE) ×3 IMPLANT
SUT VIC AB 3-0 SH 27 (SUTURE)
SUT VIC AB 3-0 SH 27X BRD (SUTURE) IMPLANT
SYR CONTROL 10ML LL (SYRINGE) ×3 IMPLANT
TOWEL OR 17X26 10 PK STRL BLUE (TOWEL DISPOSABLE) ×3 IMPLANT

## 2019-09-13 NOTE — Anesthesia Preprocedure Evaluation (Addendum)
Anesthesia Evaluation  Patient identified by MRN, date of birth, ID band Patient awake    Reviewed: Allergy & Precautions, H&P , NPO status , Patient's Chart, lab work & pertinent test results  Airway Mallampati: II   Neck ROM: full    Dental   Pulmonary shortness of breath,    breath sounds clear to auscultation       Cardiovascular hypertension, + dysrhythmias  Rhythm:regular Rate:Normal  Hx of HTN, no longer on meds d/t orthostatic hypotension   Neuro/Psych PSYCHIATRIC DISORDERS Anxiety Hx of C spine fx in past    GI/Hepatic Neg liver ROS, Pancreatic CA diagnosed 03/2019   Endo/Other  Hypothyroidism   Renal/GU negative Renal ROS     Musculoskeletal   Abdominal   Peds  Hematology  (+) anemia ,   Anesthesia Other Findings   Reproductive/Obstetrics H/o breast CA                            Anesthesia Physical  Anesthesia Plan  ASA: III  Anesthesia Plan: General   Post-op Pain Management:    Induction: Intravenous  PONV Risk Score and Plan: 3 and Treatment may vary due to age or medical condition, Ondansetron, Dexamethasone and Midazolam  Airway Management Planned: LMA  Additional Equipment: None  Intra-op Plan:   Post-operative Plan:   Informed Consent: I have reviewed the patients History and Physical, chart, labs and discussed the procedure including the risks, benefits and alternatives for the proposed anesthesia with the patient or authorized representative who has indicated his/her understanding and acceptance.       Plan Discussed with: CRNA  Anesthesia Plan Comments:      Anesthesia Quick Evaluation                                  Anesthesia Evaluation  Patient identified by MRN, date of birth, ID band Patient awake    Reviewed: Allergy & Precautions, H&P , NPO status , Patient's Chart, lab work & pertinent test results  Airway Mallampati: II   Neck  ROM: full    Dental   Pulmonary shortness of breath,    breath sounds clear to auscultation       Cardiovascular hypertension, + dysrhythmias  Rhythm:regular Rate:Normal     Neuro/Psych PSYCHIATRIC DISORDERS Anxiety    GI/Hepatic Pancreatic CA   Endo/Other  Hypothyroidism   Renal/GU      Musculoskeletal   Abdominal   Peds  Hematology   Anesthesia Other Findings   Reproductive/Obstetrics H/o breast CA                             Anesthesia Physical Anesthesia Plan  ASA: III  Anesthesia Plan: MAC   Post-op Pain Management:    Induction: Intravenous  PONV Risk Score and Plan: 2 and Propofol infusion and Treatment may vary due to age or medical condition  Airway Management Planned: Nasal Cannula  Additional Equipment:   Intra-op Plan:   Post-operative Plan:   Informed Consent: I have reviewed the patients History and Physical, chart, labs and discussed the procedure including the risks, benefits and alternatives for the proposed anesthesia with the patient or authorized representative who has indicated his/her understanding and acceptance.       Plan Discussed with: CRNA, Anesthesiologist and Surgeon  Anesthesia Plan Comments:  Anesthesia Quick Evaluation

## 2019-09-13 NOTE — Op Note (Signed)
  PRE-OPERATIVE DIAGNOSIS:  Infected port.    POST-OPERATIVE DIAGNOSIS:  Same   PROCEDURE:  Procedure(s):  REMOVAL PORT-A-CATH  SURGEON:  Surgeon(s):  Stark Klein, MD  ANESTHESIA:   general + local  EBL:   Minimal  SPECIMEN:  None  Complications : none known  Procedure:   Pt was  identified in the holding area and taken to the operating room where she was placed supine on the operating room table.  General anesthesia was induced.  The left upper chest was prepped and draped.  The prior incision was anesthetized with local anesthetic.  The incision was opened with a #15 blade.  The subcutaneous tissue was divided with the cautery.  The port was identified and the capsule opened.  The four 2-0 prolene sutures were removed.  The port was then removed and pressure held on the tract.  The catheter appeared intact without evidence of breakage, length was 19 cm.  The wound was inspected for hemostasis, which was achieved with cautery.  The wound was closed with 3-0 vicryl deep dermal interrupted sutures and 4-0 Monocryl running subcuticular suture.  The wound was cleaned, dried, and dressed with dermabond.  The patient was awakened from anesthesia and taken to the PACU in stable condition.  Needle, sponge, and instrument counts are correct.

## 2019-09-13 NOTE — Care Management Important Message (Signed)
Important Message  Patient Details IM Letter given to Sharren Bridge SW to present to the Patient Name: Julie Rollins MRN: [id number] Date of Birth: November 29, 1945   Medicare Important Message Given:  Yes     Kerin Salen 09/13/2019, 9:59 AM

## 2019-09-13 NOTE — Progress Notes (Signed)
Nutrition Follow-up  INTERVENTION:   -Ensure Enlive po BID, each supplement provides 350 kcal and 20 grams of protein -Magic cup BID with meals, each supplement provides 290 kcal and 9 grams of protein -Prostat liquid protein PO 30 ml BID with meals, each supplement provides 100 kcal, 15 grams protein.  NUTRITION DIAGNOSIS:   Increased nutrient needs related to cancer and cancer related treatments as evidenced by estimated needs.  Ongoing.  GOAL:   Patient will meet greater than or equal to 90% of their needs  TPN on hold, so hopefully progressing with PO diet.  MONITOR:   PO intake, Supplement acceptance, Labs, Weight trends, I & O's(TPN)  ASSESSMENT:   73 y.o. 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    **RD working remotely**  Patient currently NPO for port removal d/t possible persistent bacteremia.  Per surgery note, pt to have line holiday. TPN on hold. Diet will be advanced back to fulls/soft diet following procedure. Will continue current supplements ordered to maximize kcals and protein.   Admission weight: 131 lbs. Current weight: 134 lbs. I/Os: +5.3L since admit UOP: 500 ml x 24 hours  Medications: Lactated Ringers infusion Labs reviewed: CBGs: 141-144  Diet Order:   Diet Order            Diet NPO time specified  Diet effective midnight              EDUCATION NEEDS:   No education needs have been identified at this time  Skin:  Skin Assessment: Reviewed RN Assessment  Last BM:  10/19 -type 1  Height:   Ht Readings from Last 1 Encounters:  09/09/19 5\' 4"  (1.626 m)    Weight:   Wt Readings from Last 1 Encounters:  09/12/19 61.2 kg    Ideal Body Weight:  54.5 kg  BMI:  Body mass  index is 23.16 kg/m.  Estimated Nutritional Needs:   Kcal:  2000-2200  Protein:  100-115g  Fluid:  2L/day  Clayton Bibles, MS, RD, LDN Inpatient Clinical Dietitian Pager: [phone] After Hours Pager: [phone]

## 2019-09-13 NOTE — TOC Initial Note (Signed)
Transition of Care [facility]) - Initial/Assessment Note    Patient Details  Name: Julie Rollins MRN: [id number] Date of Birth: May 08, 1946  Transition of Care [facility]) CM/SW Contact:    Nila Nephew, LCSW Phone Number: [phone] 09/13/2019, 9:27 AM  Clinical Narrative:           Completed high readmission risk screening due to score 30%.  Pt uses [facility] for home infusion and [facility] for home health.             Activities of Daily Living Home Assistive Devices/Equipment: Eyeglasses, Gilford Rile (specify type) ADL Screening (condition at time of admission) Patient's cognitive ability adequate to safely complete daily activities?: Yes Is the patient deaf or have difficulty hearing?: No Does the patient have difficulty seeing, even when wearing glasses/contacts?: No Does the patient have difficulty concentrating, remembering, or making decisions?: No Patient able to express need for assistance with ADLs?: Yes Does the patient have difficulty dressing or bathing?: No Independently performs ADLs?: Yes (appropriate for developmental age) Does the patient have difficulty walking or climbing stairs?: No Weakness of Legs: None Weakness of Arms/Hands: None   Admission diagnosis:  Fever, unspecified fever cause [R50.9] Sepsis ([place]) [A41.9] Patient Active Problem List   Diagnosis Date Noted  . Malignant neoplasm of head of pancreas ([place])   . Sepsis ([place]) 09/09/2019  . Moderate protein-calorie malnutrition ([place]) 08/16/2019  . Sepsis due to undetermined organism ([place]) 07/14/2019  . Hyponatremia   . Fever   . Malnutrition of moderate degree 06/22/2019  . Encounter for nasogastric (NG) tube placement   . Partial gastric outlet obstruction   . Intractable nausea and vomiting 06/13/2019  . Genetic testing 06/08/2019  . BRCA2 gene mutation positive in female 06/08/2019  . Family history of bladder cancer   . Family history of stomach cancer   . Family history of throat cancer   . Personal  history of breast cancer   . Port-A-Cath in place 04/27/2019  . Carcinoma of head of pancreas ([place]) 04/04/2019  . Pancreatic mass 03/28/2019  . Elevated LFTs 03/28/2019  . Hypothyroidism 03/28/2019  . Diarrhea 03/28/2019  . Generalized weakness   . Jaundice   . Abnormal finding on GI tract imaging   . Elevated alkaline phosphatase level   . Cervical spine fracture ([place]) 07/31/2018   PCP:  Nolene Ebbs, MD Pharmacy:   [facility], [place] [address] [place] [address] Phone: [phone] Fax: [facility] [place]), [place] - [address] [place] ([place]) [place] [address] Phone: [phone] Fax: [phone]  [place], [place] - [place] [address] Phone: [phone] Fax: [phone]      Readmission Risk Interventions Readmission Risk Prevention Plan 09/13/2019  Transportation Screening Complete  Medication Review (RN Care Manager) Referral to Pharmacy  PCP or Specialist appointment within 3-5 days of discharge Not Complete  PCP/Specialist Appt Not Complete comments pt established at [facility] and with PCP but DC date unknown  [place] or [place] Complete  SW Recovery Care/Counseling Consult Complete  Palliative Care Screening Not Complete  Comments pt in procedure did not discuss if any past palliative care involvement, however none in current encounter  [place] Not Applicable  Some recent data might be hidden

## 2019-09-13 NOTE — Progress Notes (Signed)
PROGRESS NOTE    Julie Rollins  [id number] DOB: [birth year] DOA: 09/09/2019 PCP: Nolene Ebbs, MD   Brief Narrative: Julie Rollins is a 73 y.o. female with medical history significant of pancreatic cancer, breast cancer status post lumpectomy and radiation, hypertension, hypothyroidism. Patient presented with fever of unknown origin. Blood cultures positive for coagulase negative staph, but only one set of blood cultures obtained.   Assessment & Plan:   Active Problems:   Generalized weakness   Pancreatic mass   Hypothyroidism   Port-A-Cath in place   Partial gastric outlet obstruction   Moderate protein-calorie malnutrition (HCC)   Sepsis ([facility])   Malignant neoplasm of head of pancreas ([facility])   Sepsis Present on admission. No clear source, but blood culture positive for 2/4 Staph epi. Second set of blood cultures not available in micro, although ordered. Afebrile for 36 hours. Vitals improved. Urine culture with multiple species and unhelpful. Situation complicated by evidence of identical bacteria species (sensitivities different) two months prior -Continue Vancomycin -ID recommendations: Port removal, line holiday -Repeat Bcx (10/19) pending); Transthoracic/esophageal Echocardiogram? -Patient will likely receive PICC as per her, no plans for future chemotherapy (per oncology, tolerated chemo poorly previously)  Facial pain Patient with pain around maxillary/frontal sinuses. No sinusitis on CT maxillofacial. -Claritin, Flonase trial  Pancreatic mass Plan for Whipple procedure in November. Follows with oncology.  Hypothyroidism Normal TSH -Continue Synthroid  Partial gastric outlet obstruction -Hold TPN since treating as true bacteremia -Full liquid diet, D5 IV fluids  Tachycardia Likely secondary to sepsis. Chest CTA negative for PE. Resolved.  Moderate malnutrition -Full liquid diet for now -Dietitian consulted   DVT prophylaxis: Lovenox Code Status:    Code Status: Full Code Family Communication: None at bedside Disposition Plan: Discharge pending sepsis management since there is a high concern for persistent bacteremia   Consultants:   Infectious disease  Medical oncology  Procedures:   None  Antimicrobials:  Vancomycin (10/16>>  Cefepime (10/16>>10/19  Flagyl (10/16>>10/19)   Subjective: No concerns today other than having to have port removed. Some frequent stools  Objective: Vitals:   09/12/19 0530 09/12/19 1413 09/12/19 2000 09/13/19 0540  BP: (!) 157/84 (!) 116/57 (!) 147/68 139/70  Pulse: 95 91 100 89  Resp: 18 18 18 18   Temp: 98 F (36.7 C) 97.9 F (36.6 C) 98 F (36.7 C) 97.7 F (36.5 C)  TempSrc: Oral Oral Oral Oral  SpO2: 98% 98% 97% 98%  Weight: 61.2 kg     Height:        Intake/Output Summary (Last 24 hours) at 09/13/2019 1104 Last data filed at 09/13/2019 0600 Gross per 24 hour  Intake 466.18 ml  Output 500 ml  Net -33.82 ml   Filed Weights   09/09/19 2029 09/10/19 0925 09/12/19 0530  Weight: 59.4 kg 63.2 kg 61.2 kg    Examination:  General exam: Appears calm and comfortable Respiratory system: Clear to auscultation. Respiratory effort normal. Cardiovascular system: S1 & S2 heard, RRR. No murmurs, rubs, gallops or clicks. Gastrointestinal system: Abdomen is nondistended, soft and nontender. No organomegaly or masses felt. Normal bowel sounds heard. Central nervous system: Alert and oriented. No focal neurological deficits. Extremities: No edema. No calf tenderness Skin: No cyanosis. No rashes Psychiatry: Judgement and insight appear normal. Mood & affect appropriate.      Data Reviewed: I have personally reviewed following labs and imaging studies  CBC: Recent Labs  Lab 09/09/19 2050 09/10/19 0428 09/11/19 0254 09/12/19 0529  WBC 9.1 6.9  4.2 2.4*  NEUTROABS 8.1*  --  3.6 1.4*  HGB 11.1* 9.8* 9.9* 9.6*  HCT 33.9* 31.1* 30.9* 30.5*  MCV 102.4* 103.3* 102.7* 104.1*  PLT  234 192 162 [id number]   Basic Metabolic Panel: Recent Labs  Lab 09/09/19 2050 09/10/19 0428 09/11/19 0254 09/12/19 0529 09/13/19 0529  NA 138 138 138 139 140  K 3.8 3.6 3.8 4.4 4.8  CL 102 106 109 109 108  CO2 26 23 21* 23 24  GLUCOSE 154* 160* 192* 149* 122*  BUN 36* 28* 23 28* 26*  CREATININE 0.82 0.69 0.55 0.46 0.55  CALCIUM 9.8 8.4* 8.7* 8.8* 9.0  MG 2.1 1.9 2.2 2.2  --   PHOS 3.7 2.9 2.2* 2.7  --    GFR: Estimated Creatinine Clearance: 52.5 mL/min (by C-G formula based on SCr of 0.55 mg/dL). Liver Function Tests: Recent Labs  Lab 09/09/19 2050 09/10/19 0428 09/11/19 0254 09/12/19 0529  AST 25 24 46* 29  ALT 22 22 49* 36  ALKPHOS 102 84 95 85  BILITOT 0.5 0.5 0.4 0.1*  PROT 7.4 5.9* 6.3* 5.8*  ALBUMIN 3.9 3.0* 3.1* 2.8*   Recent Labs  Lab 09/09/19 2050  LIPASE 40   No results for input(s): AMMONIA in the last 168 hours. Coagulation Profile: Recent Labs  Lab 09/09/19 2050  INR 1.0   Cardiac Enzymes: No results for input(s): CKTOTAL, CKMB, CKMBINDEX, TROPONINI in the last 168 hours. BNP (last 3 results) No results for input(s): PROBNP in the last 8760 hours. HbA1C: No results for input(s): HGBA1C in the last 72 hours. CBG: Recent Labs  Lab 09/12/19 0307 09/12/19 0753 09/12/19 1253 09/12/19 1613 09/12/19 2002  GLUCAP 132* 158* 158* 162* 141*   Lipid Profile: Recent Labs    09/11/19 0254 09/12/19 0529  TRIG 81 63   Thyroid Function Tests: No results for input(s): TSH, T4TOTAL, FREET4, T3FREE, THYROIDAB in the last 72 hours. Anemia Panel: No results for input(s): VITAMINB12, FOLATE, FERRITIN, TIBC, IRON, RETICCTPCT in the last 72 hours. Sepsis Labs: Recent Labs  Lab 09/09/19 2050 09/10/19 0428  PROCALCITON  --  0.21  LATICACIDVEN 1.3 1.2    Recent Results (from the past 240 hour(s))  Blood Culture (routine x 2)     Status: Abnormal   Collection Time: 09/09/19  8:28 PM   Specimen: Line, Central; Blood  Result Value Ref Range Status    Specimen Description   Final    BLOOD LEFT ANTECUBITAL Performed at [facility], [place] [address], [place], [place] [address]    Special Requests   Final    BOTTLES DRAWN AEROBIC AND ANAEROBIC Blood Culture results may not be optimal due to an excessive volume of blood received in culture bottles Performed at [facility] [address], [place], [place] [address]    Culture  Setup Time   Final    GRAM POSITIVE COCCI IN BOTH AEROBIC AND ANAEROBIC BOTTLES CRITICAL RESULT CALLED TO, READ BACK BY AND VERIFIED WITH: PHARMD A PHAM [id number] AT 35 BY CM Performed at [facility], [place] [address], [place], [place] [address]    Culture STAPHYLOCOCCUS EPIDERMIDIS (A)  Final   Report Status 09/12/2019 FINAL  Final   Organism ID, Bacteria STAPHYLOCOCCUS EPIDERMIDIS  Final      Susceptibility   Staphylococcus epidermidis - MIC*    CIPROFLOXACIN >=8 RESISTANT Resistant     ERYTHROMYCIN >=8 RESISTANT Resistant     GENTAMICIN <=0.5 SENSITIVE Sensitive  OXACILLIN >=4 RESISTANT Resistant     TETRACYCLINE 2 SENSITIVE Sensitive     VANCOMYCIN 1 SENSITIVE Sensitive     TRIMETH/SULFA 80 RESISTANT Resistant     CLINDAMYCIN RESISTANT Resistant     RIFAMPIN <=0.5 SENSITIVE Sensitive     Inducible Clindamycin POSITIVE Resistant     * STAPHYLOCOCCUS EPIDERMIDIS  Urine culture     Status: Abnormal   Collection Time: 09/09/19  8:39 PM   Specimen: In/Out Cath Urine  Result Value Ref Range Status   Specimen Description   Final    IN/OUT CATH URINE Performed at [facility] [address], [place], [place] [address]    Special Requests   Final    NONE Performed at [facility], [place] [address] [place], [place] [address]    Culture (A)  Final    60,000 COLONIES/mL MULTIPLE SPECIES PRESENT, SUGGEST RECOLLECTION   Report Status 09/11/2019 FINAL  Final  SARS CORONAVIRUS 2 (TAT 6-24 HRS) Nasopharyngeal Nasopharyngeal Swab     Status:  None   Collection Time: 09/09/19  8:53 PM   Specimen: Nasopharyngeal Swab  Result Value Ref Range Status   SARS Coronavirus 2 NEGATIVE NEGATIVE Final    Comment: (NOTE) SARS-CoV-2 target nucleic acids are NOT DETECTED. The SARS-CoV-2 RNA is generally detectable in upper and lower respiratory specimens during the acute phase of infection. Negative results do not preclude SARS-CoV-2 infection, do not rule out co-infections with other pathogens, and should not be used as the sole basis for treatment or other patient management decisions. Negative results must be combined with clinical observations, patient history, and epidemiological information. The expected result is Negative. Fact Sheet for Patients: SugarRoll.be Fact Sheet for Healthcare Providers: https://www.woods-mathews.com/ This test is not yet approved or cleared by the [place] FDA and  has been authorized for detection and/or diagnosis of SARS-CoV-2 by FDA under an Emergency Use Authorization (EUA). This EUA will remain  in effect (meaning this test can be used) for the duration of the COVID-19 declaration under Section 56 4(b)(1) of the Act, 21 U.S.C. section 360bbb-3(b)(1), unless the authorization is terminated or revoked sooner. Performed at [facility], [place] [address] [place], [place] [address]   MRSA PCR Screening     Status: None   Collection Time: 09/09/19 11:58 PM   Specimen: Nasal Mucosa; Nasopharyngeal  Result Value Ref Range Status   MRSA by PCR NEGATIVE NEGATIVE Final    Comment:        The GeneXpert MRSA Assay (FDA approved for NASAL specimens only), is one component of a comprehensive MRSA colonization surveillance program. It is not intended to diagnose MRSA infection nor to guide or monitor treatment for MRSA infections. Performed at [facility], [place] [address], [place], [place] [address]   Respiratory Panel by PCR      Status: None   Collection Time: 09/10/19 10:00 AM   Specimen: Nasopharyngeal Swab; Respiratory  Result Value Ref Range Status   Adenovirus NOT DETECTED NOT DETECTED Final   Coronavirus 229E NOT DETECTED NOT DETECTED Final    Comment: (NOTE) The Coronavirus on the Respiratory Panel, DOES NOT test for the novel  Coronavirus (2019 nCoV)    Coronavirus HKU1 NOT DETECTED NOT DETECTED Final   Coronavirus NL63 NOT DETECTED NOT DETECTED Final   Coronavirus OC43 NOT DETECTED NOT DETECTED Final   Metapneumovirus NOT DETECTED NOT DETECTED Final   Rhinovirus / Enterovirus NOT DETECTED NOT DETECTED Final   Influenza A  NOT DETECTED NOT DETECTED Final   Influenza B NOT DETECTED NOT DETECTED Final   Parainfluenza Virus 1 NOT DETECTED NOT DETECTED Final   Parainfluenza Virus 2 NOT DETECTED NOT DETECTED Final   Parainfluenza Virus 3 NOT DETECTED NOT DETECTED Final   Parainfluenza Virus 4 NOT DETECTED NOT DETECTED Final   Respiratory Syncytial Virus NOT DETECTED NOT DETECTED Final   Bordetella pertussis NOT DETECTED NOT DETECTED Final   Chlamydophila pneumoniae NOT DETECTED NOT DETECTED Final   Mycoplasma pneumoniae NOT DETECTED NOT DETECTED Final    Comment: Performed at [facility], [place] [address] [place], [place] [address]  Culture, blood (routine x 2)     Status: None (Preliminary result)   Collection Time: 09/12/19 10:54 AM   Specimen: BLOOD  Result Value Ref Range Status   Specimen Description   Final    BLOOD BLOOD RIGHT ARM Performed at [facility] [address], [place], [place] [address]    Special Requests   Final    BAA Blood Culture adequate volume Performed at [facility] [address], [place], [place] [address]    Culture   Final    NO GROWTH < 24 HOURS Performed at [facility] [address] [place], [place] [address]    Report Status PENDING  Incomplete  Culture, blood (routine x 2)     Status: None (Preliminary  result)   Collection Time: 09/12/19 10:54 AM   Specimen: BLOOD  Result Value Ref Range Status   Specimen Description   Final    BLOOD BLOOD RIGHT HAND Performed at [facility] [address], [place], [place] [address]    Special Requests   Final    BAA Blood Culture adequate volume Performed at [facility] [address], [place], [place] [address]    Culture   Final    NO GROWTH < 24 HOURS Performed at [facility] [address], [place],  [address]    Report Status PENDING  Incomplete         Radiology Studies: Ct Maxillofacial Wo Contrast  Result Date: 09/11/2019 CLINICAL DATA:  Fever of unknown origin. Question sinusitis. EXAM: CT MAXILLOFACIAL WITHOUT CONTRAST TECHNIQUE: Multidetector CT imaging of the maxillofacial structures was performed. Multiplanar CT image reconstructions were also generated. COMPARISON:  None. FINDINGS: Osseous: No focal lytic or blastic lesion is present. There is no acute or healing fracture. Orbits: Bilateral lens replacements are noted. Globes and orbits are otherwise unremarkable. Sinuses: The paranasal sinuses and mastoid air cells are clear. Soft tissues: Soft tissues the face are unremarkable. Limited intracranial: Within normal limits IMPRESSION: Negative CT of the face. Electronically Signed   By: San Morelle M.D.   On: 09/11/2019 19:01        Scheduled Meds: . Chlorhexidine Gluconate Cloth  6 each Topical Daily  . enoxaparin (LOVENOX) injection  40 mg Subcutaneous Daily  . feeding supplement (ENSURE ENLIVE)  237 mL Oral BID BM  . fluticasone  1 spray Each Nare Daily  . insulin aspart  0-9 Units Subcutaneous 4 times per day  . levothyroxine  50 mcg Oral Q0600  . loratadine  10 mg Oral Daily  . mouth rinse  15 mL Mouth Rinse BID  . pantoprazole  40 mg Oral Daily  . sodium chloride flush  10-40 mL Intracatheter Q12H   Continuous Infusions: . TPN CYCLIC-ADULT (ION) 88  mL/hr at 09/12/19 2100  .  vancomycin Stopped (09/12/19 1056)     LOS: 3 days     Cordelia Poche, MD [facility] 09/13/2019, 11:04 AM  If 7PM-7AM, please contact night-coverage [email or web address]

## 2019-09-13 NOTE — Progress Notes (Signed)
INFECTIOUS DISEASE PROGRESS NOTE  ID: Julie Rollins is a 73 y.o. female with  Active Problems:   Generalized weakness   Pancreatic mass   Hypothyroidism   Port-A-Cath in place   Partial gastric outlet obstruction   Moderate protein-calorie malnutrition (HCC)   Sepsis ([place])   Malignant neoplasm of head of pancreas (HCC)  Subjective: No complaints.   Abtx:  Anti-infectives (From admission, onward)   Start     Dose/Rate Route Frequency Ordered Stop   09/10/19 1000  vancomycin (VANCOCIN) IVPB 1000 mg/200 mL premix     1,000 mg 200 mL/hr over 60 Minutes Intravenous Every 24 hours 09/10/19 0425     09/10/19 0800  ceFEPIme (MAXIPIME) 2 g in sodium chloride 0.9 % 100 mL IVPB  Status:  Discontinued     2 g 200 mL/hr over 30 Minutes Intravenous Every 12 hours 09/10/19 0426 09/12/19 1027   09/10/19 0600  metroNIDAZOLE (FLAGYL) IVPB 500 mg  Status:  Discontinued     500 mg 100 mL/hr over 60 Minutes Intravenous Every 8 hours 09/10/19 0401 09/12/19 1030   09/09/19 2030  ceFEPIme (MAXIPIME) 2 g in sodium chloride 0.9 % 100 mL IVPB     2 g 200 mL/hr over 30 Minutes Intravenous  Once 09/09/19 2023 09/09/19 2145   09/09/19 2030  metroNIDAZOLE (FLAGYL) IVPB 500 mg     500 mg 100 mL/hr over 60 Minutes Intravenous  Once 09/09/19 2023 09/09/19 2322   09/09/19 2030  vancomycin (VANCOCIN) IVPB 1000 mg/200 mL premix     1,000 mg 200 mL/hr over 60 Minutes Intravenous  Once 09/09/19 2023 09/10/19 0040      Medications:  Scheduled: . Chlorhexidine Gluconate Cloth  6 each Topical Daily  . enoxaparin (LOVENOX) injection  40 mg Subcutaneous Daily  . feeding supplement (ENSURE ENLIVE)  237 mL Oral BID BM  . fluticasone  1 spray Each Nare Daily  . insulin aspart  0-9 Units Subcutaneous 4 times per day  . levothyroxine  50 mcg Oral Q0600  . loratadine  10 mg Oral Daily  . mouth rinse  15 mL Mouth Rinse BID  . pantoprazole  40 mg Oral Daily  . sodium chloride flush  10-40 mL Intracatheter Q12H     Objective: Vital signs in last 24 hours: Temp:  [97.7 F (36.5 C)-98 F (36.7 C)] 97.7 F (36.5 C) (10/20 0540) Pulse Rate:  [89-100] 89 (10/20 0540) Resp:  [18] 18 (10/20 0540) BP: (116-147)/(57-70) 139/70 (10/20 0540) SpO2:  [97 %-98 %] 98 % (10/20 0540)   General appearance: alert, cooperative and no distress Resp: clear to auscultation bilaterally Chest wall: no tenderness, port clean, no erythema.  Cardio: regular rate and rhythm GI: normal findings: bowel sounds normal and soft, non-tender  Lab Results Recent Labs    09/11/19 0254 09/12/19 0529 09/13/19 0529  WBC 4.2 2.4*  --   HGB 9.9* 9.6*  --   HCT 30.9* 30.5*  --   NA 138 139 140  K 3.8 4.4 4.8  CL 109 109 108  CO2 21* 23 24  BUN 23 28* 26*  CREATININE 0.55 0.46 0.55   Liver Panel Recent Labs    09/11/19 0254 09/12/19 0529  PROT 6.3* 5.8*  ALBUMIN 3.1* 2.8*  AST 46* 29  ALT 49* 36  ALKPHOS 95 85  BILITOT 0.4 0.1*   Sedimentation Rate No results for input(s): ESRSEDRATE in the last 72 hours. C-Reactive Protein No results for input(s): CRP in the  last 72 hours.  Microbiology: Recent Results (from the past 240 hour(s))  Blood Culture (routine x 2)     Status: Abnormal   Collection Time: 09/09/19  8:28 PM   Specimen: Line, Central; Blood  Result Value Ref Range Status   Specimen Description   Final    BLOOD LEFT ANTECUBITAL Performed at [facility], [address], [place], [place] [address]    Special Requests   Final    BOTTLES DRAWN AEROBIC AND ANAEROBIC Blood Culture results may not be optimal due to an excessive volume of blood received in culture bottles Performed at [facility] [address], [place], [place] [address]    Culture  Setup Time   Final    GRAM POSITIVE COCCI IN BOTH AEROBIC AND ANAEROBIC BOTTLES CRITICAL RESULT CALLED TO, READ BACK BY AND VERIFIED WITH: PHARMD A PHAM [id number] AT 64 BY CM Performed at [facility], [place] [address] [place], [place] [address]    Culture STAPHYLOCOCCUS EPIDERMIDIS (A)  Final   Report Status 09/12/2019 FINAL  Final   Organism ID, Bacteria STAPHYLOCOCCUS EPIDERMIDIS  Final      Susceptibility   Staphylococcus epidermidis - MIC*    CIPROFLOXACIN >=8 RESISTANT Resistant     ERYTHROMYCIN >=8 RESISTANT Resistant     GENTAMICIN <=0.5 SENSITIVE Sensitive     OXACILLIN >=4 RESISTANT Resistant     TETRACYCLINE 2 SENSITIVE Sensitive     VANCOMYCIN 1 SENSITIVE Sensitive     TRIMETH/SULFA 80 RESISTANT Resistant     CLINDAMYCIN RESISTANT Resistant     RIFAMPIN <=0.5 SENSITIVE Sensitive     Inducible Clindamycin POSITIVE Resistant     * STAPHYLOCOCCUS EPIDERMIDIS  Urine culture     Status: Abnormal   Collection Time: 09/09/19  8:39 PM   Specimen: In/Out Cath Urine  Result Value Ref Range Status   Specimen Description   Final    IN/OUT CATH URINE Performed at [facility], [place] [address], [place], [place] [address]    Special Requests   Final    NONE Performed at [facility], [place] [address] [place], [place] [address]    Culture (A)  Final    60,000 COLONIES/mL MULTIPLE SPECIES PRESENT, SUGGEST RECOLLECTION   Report Status 09/11/2019 FINAL  Final  SARS CORONAVIRUS 2 (TAT 6-24 HRS) Nasopharyngeal Nasopharyngeal Swab     Status: None   Collection Time: 09/09/19  8:53 PM   Specimen: Nasopharyngeal Swab  Result Value Ref Range Status   SARS Coronavirus 2 NEGATIVE NEGATIVE Final    Comment: (NOTE) SARS-CoV-2 target nucleic acids are NOT DETECTED. The SARS-CoV-2 RNA is generally detectable in upper and lower respiratory specimens during the acute phase of infection. Negative results do not preclude SARS-CoV-2 infection, do not rule out co-infections with other pathogens, and should not be used as the sole basis for treatment or other patient management decisions. Negative results must be combined with clinical observations, patient history, and  epidemiological information. The expected result is Negative. Fact Sheet for Patients: SugarRoll.be Fact Sheet for Healthcare Providers: https://www.woods-mathews.com/ This test is not yet approved or cleared by the [place] FDA and  has been authorized for detection and/or diagnosis of SARS-CoV-2 by FDA under an Emergency Use Authorization (EUA). This EUA will remain  in effect (meaning this test can be used) for the duration of the COVID-19 declaration under Section 56 4(b)(1) of the Act, 21 U.S.C. section 360bbb-3(b)(1), unless the authorization is  terminated or revoked sooner. Performed at [facility], [place] [address] [place], [place] [address]   MRSA PCR Screening     Status: None   Collection Time: 09/09/19 11:58 PM   Specimen: Nasal Mucosa; Nasopharyngeal  Result Value Ref Range Status   MRSA by PCR NEGATIVE NEGATIVE Final    Comment:        The GeneXpert MRSA Assay (FDA approved for NASAL specimens only), is one component of a comprehensive MRSA colonization surveillance program. It is not intended to diagnose MRSA infection nor to guide or monitor treatment for MRSA infections. Performed at [facility], [place] [address], [place], [place] [address]   Respiratory Panel by PCR     Status: None   Collection Time: 09/10/19 10:00 AM   Specimen: Nasopharyngeal Swab; Respiratory  Result Value Ref Range Status   Adenovirus NOT DETECTED NOT DETECTED Final   Coronavirus 229E NOT DETECTED NOT DETECTED Final    Comment: (NOTE) The Coronavirus on the Respiratory Panel, DOES NOT test for the novel  Coronavirus (2019 nCoV)    Coronavirus HKU1 NOT DETECTED NOT DETECTED Final   Coronavirus NL63 NOT DETECTED NOT DETECTED Final   Coronavirus OC43 NOT DETECTED NOT DETECTED Final   Metapneumovirus NOT DETECTED NOT DETECTED Final   Rhinovirus / Enterovirus NOT DETECTED NOT DETECTED Final   Influenza A NOT  DETECTED NOT DETECTED Final   Influenza B NOT DETECTED NOT DETECTED Final   Parainfluenza Virus 1 NOT DETECTED NOT DETECTED Final   Parainfluenza Virus 2 NOT DETECTED NOT DETECTED Final   Parainfluenza Virus 3 NOT DETECTED NOT DETECTED Final   Parainfluenza Virus 4 NOT DETECTED NOT DETECTED Final   Respiratory Syncytial Virus NOT DETECTED NOT DETECTED Final   Bordetella pertussis NOT DETECTED NOT DETECTED Final   Chlamydophila pneumoniae NOT DETECTED NOT DETECTED Final   Mycoplasma pneumoniae NOT DETECTED NOT DETECTED Final    Comment: Performed at [facility], [address] [place] [address] [place], [place] [address]  Culture, blood (routine x 2)     Status: None (Preliminary result)   Collection Time: 09/12/19 10:54 AM   Specimen: BLOOD  Result Value Ref Range Status   Specimen Description   Final    BLOOD BLOOD RIGHT ARM Performed at [facility] [address] [place], [place] [address]    Special Requests   Final    BAA Blood Culture adequate volume Performed at [facility] [address], [place], [place] [address]    Culture   Final    NO GROWTH < 24 HOURS Performed at [facility] [address], [place], [place] [address]    Report Status PENDING  Incomplete  Culture, blood (routine x 2)     Status: None (Preliminary result)   Collection Time: 09/12/19 10:54 AM   Specimen: BLOOD  Result Value Ref Range Status   Specimen Description   Final    BLOOD BLOOD RIGHT HAND Performed at [facility] [address], [place], [place] [address]    Special Requests   Final    BAA Blood Culture adequate volume Performed at [facility] [address] [place], [place] [address]    Culture   Final    NO GROWTH < 24 HOURS Performed at [facility] [address] [place], [place] [address]    Report Status PENDING  Incomplete    Studies/Results: Ct Maxillofacial Wo Contrast  Result  Date:  09/11/2019 CLINICAL DATA:  Fever of unknown origin. Question sinusitis. EXAM: CT MAXILLOFACIAL WITHOUT CONTRAST TECHNIQUE: Multidetector CT imaging of the maxillofacial structures was performed. Multiplanar CT image reconstructions were also generated. COMPARISON:  None. FINDINGS: Osseous: No focal lytic or blastic lesion is present. There is no acute or healing fracture. Orbits: Bilateral lens replacements are noted. Globes and orbits are otherwise unremarkable. Sinuses: The paranasal sinuses and mastoid air cells are clear. Soft tissues: Soft tissues the face are unremarkable. Limited intracranial: Within normal limits IMPRESSION: Negative CT of the face. Electronically Signed   By: San Morelle M.D.   On: 09/11/2019 19:01     Assessment/Plan: Sepsis MRSE bacteremia Port Protein calorie malnutrition Pancreatic Ca 2020             [place] 09-2019 Breast Ca 2000  Total days of antibiotics: 3 vanco For port out today Continue vanco Await her repeat BCx Line holiday, pic, new port?         Bobby Rumpf MD, FACP Infectious Diseases (pager) [phone] [email or web address] 09/13/2019, 8:23 AM  LOS: 3 days

## 2019-09-13 NOTE — Transfer of Care (Signed)
Immediate Anesthesia Transfer of Care Note  Patient: Julie Rollins  Procedure(s) Performed: REMOVAL PORT-A-CATH (N/A Chest)  Patient Location: PACU  Anesthesia Type:General  Level of Consciousness: awake, alert , oriented and patient cooperative  Airway & Oxygen Therapy: Patient Spontanous Breathing and Patient connected to face mask  Post-op Assessment: Report given to RN and Post -op Vital signs reviewed and stable  Post vital signs: Reviewed and stable  Last Vitals:  Vitals Value Taken Time  BP 115/101 09/13/19 1235  Temp    Pulse 88 09/13/19 1236  Resp    SpO2 100 % 09/13/19 1236  Vitals shown include unvalidated device data.  Last Pain:  Vitals:   09/13/19 1107  TempSrc: Oral  PainSc:       Patients Stated Pain Goal: 2 ([id number])  Complications: No apparent anesthesia complications

## 2019-09-13 NOTE — Progress Notes (Signed)
PT Cancellation Note  Patient Details Name: Julie Rollins MRN: [id number] DOB: [birth year]   Cancelled Treatment:    Reason Eval/Treat Not Completed: Patient at procedure or test/unavailable   Weston Anna, PT Acute Rehabilitation Services Pager: [phone] Office: [phone]

## 2019-09-13 NOTE — Anesthesia Procedure Notes (Signed)
Procedure Name: LMA Insertion Date/Time: 09/13/2019 12:48 PM Performed by: Claudia Desanctis, CRNA Pre-anesthesia Checklist: Emergency Drugs available, Patient identified, Suction available and Patient being monitored Patient Re-evaluated:Patient Re-evaluated prior to induction Oxygen Delivery Method: Circle system utilized Preoxygenation: Pre-oxygenation with 100% oxygen Induction Type: IV induction Ventilation: Mask ventilation without difficulty LMA: LMA inserted LMA Size: 4.0 Number of attempts: 1 Placement Confirmation: positive ETCO2 and breath sounds checked- equal and bilateral Tube secured with: Tape Dental Injury: Teeth and Oropharynx as per pre-operative assessment

## 2019-09-13 NOTE — Consult Note (Signed)
[facility] Surgery Consult Note  Julie Rollins 08/27/1946  [id number].    Requesting MD: Cordelia Poche Chief Complaint/Reason for Consult: bacteremia  HPI:  Julie Rollins is a 73yo female PMH pancreatic cancer diagnosed 03/2019 on Lynparza at home and awaiting Whipple procedure scheduled for 10/02/2019 with Dr. Barry Dienes. Patient was admitted to [facility] 10/16 complaining of sudden fever up to 103, chills, and fatigue. She does report some pain at port site but no erythema or drainage. Blood culture drawn from the left antecubital positive for staph epidermis. She is currently on vancomycin, ID following. General surgery asked to see for port removal due to possible persistent bacteremia from 2 months ago.   States that prior to admission she was tolerating full liquids at home. Mostly drinking carnation instant breakfast x3 daily at home. She sometimes ate mashed potatoes but nothing thicker. She was also on TPN. Prealbumin 20.1 (10/18), and 16.6 (10/19). Abdominal pain stable. Mild nausea at times. Emesis x1 since admission, but thinks that the antibiotics were the cause. She is having loose BMs.   ROS: Review of Systems  Constitutional: Positive for fever and malaise/fatigue. Negative for weight loss.  HENT: Negative.   Eyes: Negative.   Respiratory: Negative.   Cardiovascular: Negative.   Gastrointestinal: Positive for abdominal pain, diarrhea and nausea. Negative for constipation and vomiting.  Genitourinary: Negative.   Musculoskeletal: Negative.   Skin: Negative.   Neurological: Negative.    All systems reviewed and otherwise negative except for as above  Family History  Problem Relation Age of Onset  . Bladder Cancer Brother   . Other Other        Denies family h/o cardiac disease  . Stomach cancer Maternal Uncle   . Throat cancer Maternal Uncle     Past Medical History:  Diagnosis Date  . Anxiety   . Blood transfusion without reported diagnosis   . Breast CA ([place])     s/p lumpectomy and radiation 2000  . Breast cancer ([place])   . Colitis    2007  . Dyspnea   . Dysrhythmia   . Family history of bladder cancer   . Family history of stomach cancer   . Family history of throat cancer   . Hernia, epigastric   . HTN (hypertension)    "mild hypertension", was on BP med years ago but it caused orthostatic hypotension and she has not been medicated since  . Hypothyroid   . Pancreatic cancer ([place]) dx'd 03/2019  . Personal history of breast cancer   . Personal history of radiation therapy 2001  . Poor appetite 04/2019  . Vertigo   . Vertigo     Past Surgical History:  Procedure Laterality Date  . ABDOMINAL HYSTERECTOMY     52yrs ago  . ANTERIOR CERVICAL DECOMP/DISCECTOMY FUSION N/A 08/02/2018   Procedure: ANTERIOR CERVICAL DECOMPRESSION/DISCECTOMY FUSION CERVICAL FIVE- CERVICAL SIX;  Surgeon: Earnie Larsson, MD;  Location: [facility];  Service: Neurosurgery;  Laterality: N/A;  ANTERIOR CERVICAL DECOMPRESSION/DISCECTOMY FUSION CERVICAL FIVE- CERVICAL SIX  . BILIARY BRUSHING  03/29/2019   Procedure: BILIARY BRUSHING;  Surgeon: [name], MD;  Location: [facility] ENDOSCOPY;  Service: Endoscopy;;  . BILIARY STENT PLACEMENT  03/29/2019   Procedure: BILIARY STENT PLACEMENT;  Surgeon: [name], MD;  Location: [facility] ENDOSCOPY;  Service: Endoscopy;;  . BIOPSY  03/29/2019   Procedure: BIOPSY;  Surgeon: [name], MD;  Location: [facility] ENDOSCOPY;  Service: Endoscopy;;  . BREAST LUMPECTOMY     2000  .  ERCP N/A 03/29/2019   Procedure: ENDOSCOPIC RETROGRADE CHOLANGIOPANCREATOGRAPHY (ERCP);  Surgeon: [name], MD;  Location: [facility] ENDOSCOPY;  Service: Endoscopy;  Laterality: N/A;  . ESOPHAGOGASTRODUODENOSCOPY N/A 06/17/2019   Procedure: ESOPHAGOGASTRODUODENOSCOPY (EGD);  Surgeon: Gatha Mayer, MD;  Location: [facility] ENDOSCOPY;  Service: Gastroenterology;  Laterality: N/A;  . ESOPHAGOGASTRODUODENOSCOPY (EGD) WITH PROPOFOL N/A 03/29/2019   Procedure: ESOPHAGOGASTRODUODENOSCOPY  (EGD) WITH PROPOFOL;  Surgeon: [name], MD;  Location: [facility] ENDOSCOPY;  Service: Endoscopy;  Laterality: N/A;  . ESOPHAGOGASTRODUODENOSCOPY (EGD) WITH PROPOFOL N/A 06/09/2019   Procedure: ESOPHAGOGASTRODUODENOSCOPY (EGD) WITH PROPOFOL;  Surgeon: [name], MD;  Location: [facility] ENDOSCOPY;  Service: Endoscopy;  Laterality: N/A;  . EUS N/A 03/29/2019   Procedure: UPPER ENDOSCOPIC ULTRASOUND (EUS) LINEAR;  Surgeon: [name], MD;  Location: [facility] ENDOSCOPY;  Service: Endoscopy;  Laterality: N/A;  . EUS N/A 06/09/2019   Procedure: UPPER ENDOSCOPIC ULTRASOUND (EUS) LINEAR;  Surgeon: [name], MD;  Location: [facility] ENDOSCOPY;  Service: Endoscopy;  Laterality: N/A;  . FIDUCIAL MARKER PLACEMENT  06/09/2019   Procedure: FIDUCIAL MARKER PLACEMENT;  Surgeon: [name], MD;  Location: [facility] ENDOSCOPY;  Service: Endoscopy;;  . FINE NEEDLE ASPIRATION  03/29/2019   Procedure: FINE NEEDLE ASPIRATION (FNA);  Surgeon: [name], MD;  Location: [facility] ENDOSCOPY;  Service: Endoscopy;;  . HERNIA REPAIR     20+ years ago  . IR CV LINE INJECTION  08/23/2019  . PORTACATH PLACEMENT N/A 04/12/2019   Procedure: INSERTION PORT-A-CATH LEFT SUBCLAVIAN;  Surgeon: Stark Klein, MD;  Location: [place];  Service: General;  Laterality: N/A;  . RETINAL DETACHMENT SURGERY    . SPHINCTEROTOMY  03/29/2019   Procedure: SPHINCTEROTOMY;  Surgeon: [name], MD;  Location: [facility] ENDOSCOPY;  Service: Endoscopy;;    Social History:  reports that she has never smoked. She has never used smokeless tobacco. She reports that she does not drink alcohol or use drugs.  Allergies:  Allergies  Allergen Reactions  . Codeine Nausea Only    Medications Prior to Admission  Medication Sig Dispense Refill  . diltiazem (CARDIZEM CD) 180 MG 24 hr capsule Take 180 mg by mouth daily.    . hydrocortisone (ANUSOL-HC) 2.5 % rectal cream Place rectally 2 (two) times daily. (Patient taking differently: Place  rectally daily as needed. )    . levothyroxine (SYNTHROID, LEVOTHROID) 50 MCG tablet Take 50 mcg by mouth every morning.    . Multiple Vitamin (MULTIVITAMIN WITH MINERALS) TABS tablet Take 1 tablet by mouth daily.    . pantoprazole (PROTONIX) 40 MG tablet Take 1 tablet (40 mg total) by mouth 2 (two) times daily. (Patient taking differently: Take 40 mg by mouth daily. )    . mirtazapine (REMERON) 7.5 MG tablet Take 1 tablet (7.5 mg total) by mouth at bedtime. (Patient not taking: Reported on 09/09/2019) 30 tablet 3  . olaparib (LYNPARZA) 100 MG tablet Take 1 tablet (100 mg total) by mouth 2 (two) times daily. Swallow whole. May take with food to decrease nausea and vomiting. (Patient not taking: Reported on 09/09/2019) 60 tablet 0  . ondansetron (ZOFRAN-ODT) 8 MG disintegrating tablet Take 1 tablet (8 mg total) by mouth every 8 (eight) hours as needed for nausea or vomiting. (Patient not taking: Reported on 07/14/2019) 20 tablet 0    Prior to Admission medications   Medication Sig Start Date End Date Taking? Authorizing Provider  diltiazem (CARDIZEM CD) 180 MG 24 hr capsule Take 180 mg by mouth daily. 05/18/18  Yes [provider]  hydrocortisone (ANUSOL-HC) 2.5 % rectal cream Place rectally 2 (two) times daily. Patient taking differently: Place rectally daily as needed.  06/23/19  Yes Samuella Cota, MD  levothyroxine (SYNTHROID, LEVOTHROID) 50 MCG tablet Take 50 mcg by mouth every morning.   Yes [provider]  Multiple Vitamin (MULTIVITAMIN WITH MINERALS) TABS tablet Take 1 tablet by mouth daily.   Yes [provider]  pantoprazole (PROTONIX) 40 MG tablet Take 1 tablet (40 mg total) by mouth 2 (two) times daily. Patient taking differently: Take 40 mg by mouth daily.  06/23/19 06/22/20 Yes Samuella Cota, MD  mirtazapine (REMERON) 7.5 MG tablet Take 1 tablet (7.5 mg total) by mouth at bedtime. Patient not taking: Reported on 09/09/2019 09/05/19   Truitt Merle, MD   olaparib [facility]) 100 MG tablet Take 1 tablet (100 mg total) by mouth 2 (two) times daily. Swallow whole. May take with food to decrease nausea and vomiting. Patient not taking: Reported on 09/09/2019 09/05/19   Truitt Merle, MD  ondansetron (ZOFRAN-ODT) 8 MG disintegrating tablet Take 1 tablet (8 mg total) by mouth every 8 (eight) hours as needed for nausea or vomiting. Patient not taking: Reported on [id number]   Delora Fuel, MD  potassium chloride 20 MEQ/15ML (10%) SOLN Take 15 mLs (20 mEq total) by mouth daily. Patient not taking: Reported on 06/01/2019 05/04/19 06/12/19  Alla Feeling, NP    Blood pressure 139/70, pulse 89, temperature 97.7 F (36.5 C), temperature source Oral, resp. rate 18, height 5\' 4"  (1.626 m), weight 61.2 kg, SpO2 98 %. Physical Exam: General: pleasant, WD/WN white female who is laying in bed in NAD HEENT: head is normocephalic, atraumatic.  Sclera are noninjected.  Pupils equal and round.  Ears and nose without any masses or lesions.  Mouth is pink and moist. Dentition fair Heart: regular, rate, and rhythm.  No obvious murmurs, gallops, or rubs noted.  Palpable pedal pulses bilaterally Lungs: CTAB, no wheezes, rhonchi, or rales noted.  Respiratory effort nonlabored. Port left chest cdi without erythema or drainage Abd: soft, NT/ND, +BS, no masses, hernias, or organomegaly MS: calves soft and nontender Skin: warm and dry with no masses, lesions, or rashes Psych: A&Ox3 with an appropriate affect. Neuro: cranial nerves grossly intact, extremity CSM intact bilaterally, normal speech  Results for orders placed or performed during the hospital encounter of 09/09/19 (from the past 48 hour(s))  Glucose, capillary     Status: Abnormal   Collection Time: 09/11/19 12:11 PM  Result Value Ref Range   Glucose-Capillary 206 (H) 70 - 99 mg/dL  Glucose, capillary     Status: None   Collection Time: 09/11/19  3:19 PM  Result Value Ref Range   Glucose-Capillary 76 70 -  99 mg/dL  Glucose, capillary     Status: Abnormal   Collection Time: 09/11/19  8:34 PM  Result Value Ref Range   Glucose-Capillary 170 (H) 70 - 99 mg/dL  Glucose, capillary     Status: Abnormal   Collection Time: 09/12/19  3:07 AM  Result Value Ref Range   Glucose-Capillary 132 (H) 70 - 99 mg/dL  Comprehensive metabolic panel     Status: Abnormal   Collection Time: 09/12/19  5:29 AM  Result Value Ref Range   Sodium 139 135 - 145 mmol/L   Potassium 4.4 3.5 - 5.1 mmol/L   Chloride 109 98 - 111 mmol/L   CO2 23 22 - 32 mmol/L   Glucose, Bld 149 (H)  70 - 99 mg/dL   BUN 28 (H) 8 - 23 mg/dL   Creatinine, Ser 0.46 0.44 - 1.00 mg/dL   Calcium 8.8 (L) 8.9 - 10.3 mg/dL   Total Protein 5.8 (L) 6.5 - 8.1 g/dL   Albumin 2.8 (L) 3.5 - 5.0 g/dL   AST 29 15 - 41 U/L   ALT 36 0 - 44 U/L   Alkaline Phosphatase 85 38 - 126 U/L   Total Bilirubin 0.1 (L) 0.3 - 1.2 mg/dL   GFR calc non Af Amer >60 >60 mL/min   GFR calc Af Amer >60 >60 mL/min   Anion gap 7 5 - 15    Comment: Performed at [facility], [place] [address] [place], [place] [address]  Magnesium     Status: None   Collection Time: 09/12/19  5:29 AM  Result Value Ref Range   Magnesium 2.2 1.7 - 2.4 mg/dL    Comment: Performed at [facility], [place] [address], [place], [place] [address]  Phosphorus     Status: None   Collection Time: 09/12/19  5:29 AM  Result Value Ref Range   Phosphorus 2.7 2.5 - 4.6 mg/dL    Comment: Performed at [facility], [place] [address] [place], [place] [address]  CBC     Status: Abnormal   Collection Time: 09/12/19  5:29 AM  Result Value Ref Range   WBC 2.4 (L) 4.0 - 10.5 K/uL   RBC 2.93 (L) 3.87 - 5.11 MIL/uL   Hemoglobin 9.6 (L) 12.0 - 15.0 g/dL   HCT 30.5 (L) 36.0 - 46.0 %   MCV 104.1 (H) 80.0 - 100.0 fL   MCH 32.8 26.0 - 34.0 pg   MCHC 31.5 30.0 - 36.0 g/dL   RDW 17.8 (H) 11.5 - 15.5 %   Platelets 151 150 - 400 K/uL   nRBC 0.0 0.0 - 0.2 %     Comment: Performed at [facility], [place] [address], [place], [place] [address]  Differential     Status: Abnormal   Collection Time: 09/12/19  5:29 AM  Result Value Ref Range   Neutrophils Relative % 58 %   Neutro Abs 1.4 (L) 1.7 - 7.7 K/uL   Lymphocytes Relative 22 %   Lymphs Abs 0.5 (L) 0.7 - 4.0 K/uL   Monocytes Relative 13 %   Monocytes Absolute 0.3 0.1 - 1.0 K/uL   Eosinophils Relative 6 %   Eosinophils Absolute 0.1 0.0 - 0.5 K/uL   Basophils Relative 1 %   Basophils Absolute 0.0 0.0 - 0.1 K/uL   Immature Granulocytes 0 %   Abs Immature Granulocytes 0.00 0.00 - 0.07 K/uL    Comment: Performed at [facility], [place] [address], [place], [place] [address]  Triglycerides     Status: None   Collection Time: 09/12/19  5:29 AM  Result Value Ref Range   Triglycerides 63 <150 mg/dL    Comment: Performed at [facility], [place] [address] [place], [place] [address]  Prealbumin     Status: Abnormal   Collection Time: 09/12/19  5:29 AM  Result Value Ref Range   Prealbumin 16.6 (L) 18 - 38 mg/dL    Comment: Performed at [facility], [place] [address] [place], [place] [address]  Glucose, capillary     Status: Abnormal   Collection Time: 09/12/19  7:53 AM  Result Value Ref Range   Glucose-Capillary 158 (H) 70 - 99 mg/dL  Comment 1 Notify RN    Comment 2 Document in Chart   Glucose, capillary     Status: Abnormal   Collection Time: 09/12/19 12:53 PM  Result Value Ref Range   Glucose-Capillary 158 (H) 70 - 99 mg/dL   Comment 1 Notify RN    Comment 2 Document in Chart   Glucose, capillary     Status: Abnormal   Collection Time: 09/12/19  4:13 PM  Result Value Ref Range   Glucose-Capillary 162 (H) 70 - 99 mg/dL  Glucose, capillary     Status: Abnormal   Collection Time: 09/12/19  8:02 PM  Result Value Ref Range   Glucose-Capillary 141 (H) 70 - 99 mg/dL  Basic metabolic panel     Status: Abnormal   Collection  Time: 09/13/19  5:29 AM  Result Value Ref Range   Sodium 140 135 - 145 mmol/L   Potassium 4.8 3.5 - 5.1 mmol/L   Chloride 108 98 - 111 mmol/L   CO2 24 22 - 32 mmol/L   Glucose, Bld 122 (H) 70 - 99 mg/dL   BUN 26 (H) 8 - 23 mg/dL   Creatinine, Ser 0.55 0.44 - 1.00 mg/dL   Calcium 9.0 8.9 - 10.3 mg/dL   GFR calc non Af Amer >60 >60 mL/min   GFR calc Af Amer >60 >60 mL/min   Anion gap 8 5 - 15    Comment: Performed at [facility], [place] [address], [place], [place] [address]   Ct Maxillofacial Wo Contrast  Result Date: 09/11/2019 CLINICAL DATA:  Fever of unknown origin. Question sinusitis. EXAM: CT MAXILLOFACIAL WITHOUT CONTRAST TECHNIQUE: Multidetector CT imaging of the maxillofacial structures was performed. Multiplanar CT image reconstructions were also generated. COMPARISON:  None. FINDINGS: Osseous: No focal lytic or blastic lesion is present. There is no acute or healing fracture. Orbits: Bilateral lens replacements are noted. Globes and orbits are otherwise unremarkable. Sinuses: The paranasal sinuses and mastoid air cells are clear. Soft tissues: Soft tissues the face are unremarkable. Limited intracranial: Within normal limits IMPRESSION: Negative CT of the face. Electronically Signed   By: [place] Morelle M.D.   On: 09/11/2019 19:01   Anti-infectives (From admission, onward)   Start     Dose/Rate Route Frequency Ordered Stop   09/10/19 1000  vancomycin (VANCOCIN) IVPB 1000 mg/200 mL premix     1,000 mg 200 mL/hr over 60 Minutes Intravenous Every 24 hours 09/10/19 0425     09/10/19 0800  ceFEPIme (MAXIPIME) 2 g in sodium chloride 0.9 % 100 mL IVPB  Status:  Discontinued     2 g 200 mL/hr over 30 Minutes Intravenous Every 12 hours 09/10/19 0426 09/12/19 1027   09/10/19 0600  metroNIDAZOLE (FLAGYL) IVPB 500 mg  Status:  Discontinued     500 mg 100 mL/hr over 60 Minutes Intravenous Every 8 hours 09/10/19 0401 09/12/19 1030   09/09/19 2030  ceFEPIme (MAXIPIME)  2 g in sodium chloride 0.9 % 100 mL IVPB     2 g 200 mL/hr over 30 Minutes Intravenous  Once 09/09/19 2023 09/09/19 2145   09/09/19 2030  metroNIDAZOLE (FLAGYL) IVPB 500 mg     500 mg 100 mL/hr over 60 Minutes Intravenous  Once 09/09/19 2023 09/09/19 2322   09/09/19 2030  vancomycin (VANCOCIN) IVPB 1000 mg/200 mL premix     1,000 mg 200 mL/hr over 60 Minutes Intravenous  Once 09/09/19 2023 09/10/19 0040        Assessment/Plan Hx of Breast Cancer  in 2000 with Lumpectomy and Radiation Hypothyroidism Anxiety Malnutrition - prealbumin 16.6 (10/19); has been on FLD and TPN at home but will have to stop TPN for line holiday after port removal. Agree with dietician consult. Will need to maximize PO intake of calories and protein in preparation for surgery Pancreatic cancer - followed by Dr. [name], scheduled for Whipple 10/09/2019 with Dr. Barry Dienes  Bacteremia - Unknown source but port may be contributing with concern for possible persistent bacteremia. Plan for port removal today and line holiday to follow. Keep NPO for procedure. Ok to restart full liquids and protein supplements postop.  ID - maxipime/flagyl 10/16>>10/19, vancomcyin 10/16>> VTE - SCDs, lovenox FEN - IVF, NPO, stopping TPN Foley - wick Follow up - Dr. Isabella Bowens, [facility] Surgery 09/13/2019, 8:20 AM Please see Amion for pager number during day hours 7:00am-4:30pm

## 2019-09-13 NOTE — Progress Notes (Signed)
PT Cancellation Note  Patient Details Name: Julie Rollins MRN: [id number] DOB: 1946-02-27   Cancelled Treatment:    Reason Eval/Treat Not Completed: Attempted PT tx session-pt declined to participate. Will check back another day.   Weston Anna, PT Acute Rehabilitation Services Pager: [phone] Office: [phone]

## 2019-09-13 NOTE — Progress Notes (Addendum)
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  She is scheduled for Whipple surgery on 10/20/2019 and plan is to continue TPN for nutrition until procedure.  She presented to the ED on 10/16 with c/o fever and chills.  TPN resumed on admission.  Significant events:  10/17: patient's home TPN bag currently running; she stated that she hung bag at 1PM on 10/16; 1 of 2 blood cx bottles with [facility] 10/18: Per Dr. Lonny Prude, plan is to consult ID re: possible bacteremia and recom. for port  Today:   Glucose (goal <150): 141-162, 5 units insulin. Sensitive scale 4 times/day, custom times for cyclic rate  Electrolytes:Na, K, Mag, CorrCa 10; Phos repleted at 2.7; CL wnl, CO2 improved  at 24  Renal: scr ok  LFTs: AST/ALT down to wnl, Tbili ok  TGs: 81 (10/18), 63 (10/19)  Prealbumin: 23.1 (10/17), 20.1 (10/18), 16.6 (10/19)  NUTRITIONAL GOALS                                                                                             RD recs per 24 hr KCal: 2000-2200  Protein: 100 - 115 gm Fluid: 2L  Ensure Enlive: 350 kcal, 20gm protein, ordered bid, charted once 10/18 Magic Cup: 290 kcal, 9gm protein  [facility] TPN 3-in-1 formulation: - 18 hr cyclic TPN (total TPN volume 1500 mL) - AA= 104 gm - dextrose= 100 gm - lipid = 45 gm -  Total kcal/day = 1256 kcal - lytes in bag: calcium gluconate 9.6 meq, magnesium sulfate 8 meq, potassium acetate 98 mew, potassium phosphate 15 mmol, sodium acetate 77 meq - trace elements and MVI  Inpatient Custom 18 hr cyclic TPN with 99991111 ml volume provides: -  105 g/day protein (  70 g/L) -  45 g/day Lipid  ( 30  G/L) --> 37% of total TPN kcal - 105  g/day Dextrose (7 %) -  1227 Kcal/day  PLAN                                                                                                                         At 1800 today: Holding TPN x 24 hr, possible for 48 hr, "central line holiday"   18 hr cyclic TPN: 44 ml/hr first and last hour, 88 ml/hr for 16 hrs  Electrolytes in TPN: Standard except K 70 meq/L, Cl:Ac ratio -- max acetate  At goal rate per PTA TPN, additional kcal per liquid diet  TPN to contain standard multivitamins and trace elements.   Change  sensitive SSI to tid ac with soft diet, resume CBGs at (3AM, 1PM, 3PM, 8PM) when TPN resumes  CBGs: 2 hrs past cyclic TPN start + 1 hr past cycle TPN d/c'ed + middle of TPN infusion + while off TPN  Received dexamethasone 5mg  today  TPN lab panels on Mondays & Thursdays.  BMET in am, Mag & Phos  F/u daily.   Minda Ditto PharmD 09/13/2019,12:24 PM

## 2019-09-14 ENCOUNTER — Encounter (HOSPITAL_COMMUNITY): Payer: Self-pay | Admitting: General Surgery

## 2019-09-14 DIAGNOSIS — R509 Fever, unspecified: Secondary | ICD-10-CM

## 2019-09-14 DIAGNOSIS — E46 Unspecified protein-calorie malnutrition: Secondary | ICD-10-CM

## 2019-09-14 DIAGNOSIS — A4102 Sepsis due to Methicillin resistant Staphylococcus aureus: Secondary | ICD-10-CM | POA: Diagnosis not present

## 2019-09-14 LAB — CBC
HCT: 31 % — ABNORMAL LOW (ref 36.0–46.0)
Hemoglobin: 9.8 g/dL — ABNORMAL LOW (ref 12.0–15.0)
MCH: 32.6 pg (ref 26.0–34.0)
MCHC: 31.6 g/dL (ref 30.0–36.0)
MCV: 103 fL — ABNORMAL HIGH (ref 80.0–100.0)
Platelets: 204 10*3/uL (ref 150–400)
RBC: 3.01 MIL/uL — ABNORMAL LOW (ref 3.87–5.11)
RDW: 17.6 % — ABNORMAL HIGH (ref 11.5–15.5)
WBC: 4.8 10*3/uL (ref 4.0–10.5)
nRBC: 0 % (ref 0.0–0.2)

## 2019-09-14 LAB — COMPREHENSIVE METABOLIC PANEL
ALT: 29 U/L (ref 0–44)
AST: 23 U/L (ref 15–41)
Albumin: 3.2 g/dL — ABNORMAL LOW (ref 3.5–5.0)
Alkaline Phosphatase: 85 U/L (ref 38–126)
Anion gap: 8 (ref 5–15)
BUN: 30 mg/dL — ABNORMAL HIGH (ref 8–23)
CO2: 24 mmol/L (ref 22–32)
Calcium: 8.9 mg/dL (ref 8.9–10.3)
Chloride: 105 mmol/L (ref 98–111)
Creatinine, Ser: 0.61 mg/dL (ref 0.44–1.00)
GFR calc Af Amer: >60 mL/min (ref >60)
GFR calc non Af Amer: >60 mL/min (ref >60)
Glucose, Bld: 113 mg/dL — ABNORMAL HIGH (ref 70–99)
Potassium: 3.9 mmol/L (ref 3.5–5.1)
Sodium: 137 mmol/L (ref 135–145)
Total Bilirubin: 0.6 mg/dL (ref 0.3–1.2)
Total Protein: 6.4 g/dL — ABNORMAL LOW (ref 6.5–8.1)

## 2019-09-14 LAB — PHOSPHORUS: Phosphorus: 4.3 mg/dL (ref 2.5–4.6)

## 2019-09-14 LAB — GLUCOSE, CAPILLARY
Glucose-Capillary: 139 mg/dL — ABNORMAL HIGH (ref 70–99)
Glucose-Capillary: 140 mg/dL — ABNORMAL HIGH (ref 70–99)

## 2019-09-14 LAB — VANCOMYCIN, PEAK: Vancomycin Pk: 10 ug/mL — ABNORMAL LOW (ref 30–40)

## 2019-09-14 LAB — MAGNESIUM: Magnesium: 2.3 mg/dL (ref 1.7–2.4)

## 2019-09-14 NOTE — Anesthesia Postprocedure Evaluation (Signed)
Anesthesia Post Note  Patient: Julie Rollins  Procedure(s) Performed: REMOVAL PORT-A-CATH (N/A Chest)     Patient location during evaluation: PACU Anesthesia Type: General Level of consciousness: awake and alert Pain management: pain level controlled Vital Signs Assessment: post-procedure vital signs reviewed and stable Respiratory status: spontaneous breathing, nonlabored ventilation and respiratory function stable Cardiovascular status: blood pressure returned to baseline and stable Postop Assessment: no apparent nausea or vomiting Anesthetic complications: no    Last Vitals:  Vitals:   09/13/19 2028 09/14/19 0530  BP: (!) 143/78 (!) 143/68  Pulse: 99 81  Resp: 20 20  Temp: 36.6 C (!) 36.4 C  SpO2: 96% 99%    Last Pain:  Vitals:   09/14/19 0530  TempSrc: Oral  PainSc:                  Lynda Rainwater

## 2019-09-14 NOTE — Progress Notes (Signed)
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  She is scheduled for Whipple surgery on 10/06/2019 and plan is to continue TPN for nutrition until procedure.  She presented to the ED on 10/16 with c/o fever and chills.  TPN resumed on admission.  Significant events:  10/17: patient's home TPN bag currently running; she stated that she hung bag at 1PM on 10/16; 1 of 2 blood cx bottles with [facility] 10/18: Per Dr. Lonny Prude, plan is to consult ID re: possible bacteremia and recom. for port  Today:   Glucose (goal <150): 144-174, 2 units insulin. Sensitive scale tidac on soft diet, TPN off  Electrolytes:Na, K, Mag, CorrCa 10; Phos repleted at 2.7; CL wnl, CO2 improved  at 24  Renal: scr ok  LFTs: AST/ALT down to wnl, Tbili ok  TGs: 81 (10/18), 63 (10/19)  Prealbumin: 23.1 (10/17), 20.1 (10/18), 16.6 (10/19)  NUTRITIONAL GOALS                                                                                             RD recs per 24 hr KCal: 2000-2200  Protein: 100 - 115 gm Fluid: 2L  Ensure Enlive: 350 kcal, 20gm protein, ordered bid, charted once 10/18 Magic Cup: 290 kcal, 9gm protein  [facility] TPN 3-in-1 formulation: - 18 hr cyclic TPN (total TPN volume 1500 mL) - AA= 104 gm - dextrose= 100 gm - lipid = 45 gm - Total kcal/day =  1256 kcal - lytes in bag: calcium gluconate 9.6 meq, magnesium sulfate 8 meq, potassium acetate 98 mew, potassium phosphate 15 mmol, sodium acetate 77 meq - trace elements and MVI  Inpatient Custom 18 hr cyclic TPN with 99991111 ml volume provides: -  105 g/day protein (  70 g/L) -  45 g/day Lipid  ( 30  G/L) --> 37% of total TPN kcal - 105  g/day Dextrose (7 %) -  1227 Kcal/day  PLAN  At 1800 today: Holding TPN again today, "central line holiday" with bacteremia   18 hr cyclic TPN: 44 ml/hr first and last hour, 88 ml/hr for 16 hrs  Electrolytes in TPN: Standard except K 70 meq/L, Cl:Ac ratio -- max acetate  At goal rate per PTA TPN, additional kcal per liquid diet  TPN to contain standard multivitamins and trace elements.   Change  sensitive SSI to tid ac with soft diet, resume CBGs at (3AM, 1PM, 3PM, 8PM) when TPN resumes  CBGs: 2 hrs past cyclic TPN start + 1 hr past cycle TPN d/c'ed + middle of TPN infusion + while off TPN  Received dexamethasone 5mg  today  TPN lab panels on Mondays & Thursdays.  BMET in am, Mag & Phos  F/u daily.   Minda Ditto PharmD 09/14/2019,11:44 AM

## 2019-09-14 NOTE — Progress Notes (Signed)
INFECTIOUS DISEASE PROGRESS NOTE  ID: Julie Rollins is a 73 y.o. female with  Active Problems:   Generalized weakness   Pancreatic mass   Hypothyroidism   Port-A-Cath in place   Partial gastric outlet obstruction   Moderate protein-calorie malnutrition (HCC)   Sepsis ([place])   Malignant neoplasm of head of pancreas (HCC)  Subjective: Some stinging at her wound.   Abtx:  Anti-infectives (From admission, onward)   Start     Dose/Rate Route Frequency Ordered Stop   09/14/19 0600  ceFAZolin (ANCEF) IVPB 2g/100 mL premix  Status:  Discontinued     2 g 200 mL/hr over 30 Minutes Intravenous On call to O.R. 09/13/19 1243 09/13/19 1329   09/10/19 1000  vancomycin (VANCOCIN) IVPB 1000 mg/200 mL premix     1,000 mg 200 mL/hr over 60 Minutes Intravenous Every 24 hours 09/10/19 0425     09/10/19 0800  ceFEPIme (MAXIPIME) 2 g in sodium chloride 0.9 % 100 mL IVPB  Status:  Discontinued     2 g 200 mL/hr over 30 Minutes Intravenous Every 12 hours 09/10/19 0426 09/12/19 1027   09/10/19 0600  metroNIDAZOLE (FLAGYL) IVPB 500 mg  Status:  Discontinued     500 mg 100 mL/hr over 60 Minutes Intravenous Every 8 hours 09/10/19 0401 09/12/19 1030   09/09/19 2030  ceFEPIme (MAXIPIME) 2 g in sodium chloride 0.9 % 100 mL IVPB     2 g 200 mL/hr over 30 Minutes Intravenous  Once 09/09/19 2023 09/09/19 2145   09/09/19 2030  metroNIDAZOLE (FLAGYL) IVPB 500 mg     500 mg 100 mL/hr over 60 Minutes Intravenous  Once 09/09/19 2023 09/09/19 2322   09/09/19 2030  vancomycin (VANCOCIN) IVPB 1000 mg/200 mL premix     1,000 mg 200 mL/hr over 60 Minutes Intravenous  Once 09/09/19 2023 09/10/19 0040      Medications:  Scheduled: . Chlorhexidine Gluconate Cloth  6 each Topical Daily  . enoxaparin (LOVENOX) injection  40 mg Subcutaneous Daily  . feeding supplement (ENSURE ENLIVE)  237 mL Oral BID BM  . feeding supplement (PRO-STAT SUGAR FREE 64)  30 mL Oral BID  . fluticasone  1 spray Each Nare Daily  .  insulin aspart  0-9 Units Subcutaneous TID WC  . levothyroxine  50 mcg Oral Q0600  . loratadine  10 mg Oral Daily  . mouth rinse  15 mL Mouth Rinse BID  . pantoprazole  40 mg Oral Daily  . sodium chloride flush  10-40 mL Intracatheter Q12H    Objective: Vital signs in last 24 hours: Temp:  [97.5 F (36.4 C)-98.6 F (37 C)] 97.5 F (36.4 C) (10/21 0530) Pulse Rate:  [81-99] 81 (10/21 0530) Resp:  [13-20] 20 (10/21 0530) BP: (116-148)/(63-80) 143/68 (10/21 0530) SpO2:  [96 %-99 %] 99 % (10/21 0530)   General appearance: alert and no distress Resp: clear to auscultation bilaterally Chest wall: L sided wound clean.  Cardio: regular rate and rhythm GI: normal findings: bowel sounds normal and soft, non-tender  Lab Results Recent Labs    09/13/19 1400 09/14/19 0544  WBC 4.2 4.8  HGB 9.8* 9.8*  HCT 30.9* 31.0*  NA 138 137  K 4.2 3.9  CL 105 105  CO2 25 24  BUN 32* 30*  CREATININE 0.54 0.61   Liver Panel Recent Labs    09/13/19 1400 09/14/19 0544  PROT 6.6 6.4*  ALBUMIN 3.4* 3.2*  AST 23 23  ALT 29 29  ALKPHOS  88 85  BILITOT 0.7 0.6   Sedimentation Rate No results for input(s): ESRSEDRATE in the last 72 hours. C-Reactive Protein No results for input(s): CRP in the last 72 hours.  Microbiology: Recent Results (from the past 240 hour(s))  Blood Culture (routine x 2)     Status: Abnormal   Collection Time: 09/09/19  8:28 PM   Specimen: Line, Central; Blood  Result Value Ref Range Status   Specimen Description   Final    BLOOD LEFT ANTECUBITAL Performed at [facility], [address], [place], [place] [address]    Special Requests   Final    BOTTLES DRAWN AEROBIC AND ANAEROBIC Blood Culture results may not be optimal due to an excessive volume of blood received in culture bottles Performed at [facility] [address], [place], [place] [address]    Culture  Setup Time   Final    GRAM POSITIVE COCCI IN BOTH AEROBIC AND  ANAEROBIC BOTTLES CRITICAL RESULT CALLED TO, READ BACK BY AND VERIFIED WITH: PHARMD A PHAM [id number] AT 40 BY CM Performed at [facility], [place] [address], [place], [place] [address]    Culture STAPHYLOCOCCUS EPIDERMIDIS (A)  Final   Report Status 09/12/2019 FINAL  Final   Organism ID, Bacteria STAPHYLOCOCCUS EPIDERMIDIS  Final      Susceptibility   Staphylococcus epidermidis - MIC*    CIPROFLOXACIN >=8 RESISTANT Resistant     ERYTHROMYCIN >=8 RESISTANT Resistant     GENTAMICIN <=0.5 SENSITIVE Sensitive     OXACILLIN >=4 RESISTANT Resistant     TETRACYCLINE 2 SENSITIVE Sensitive     VANCOMYCIN 1 SENSITIVE Sensitive     TRIMETH/SULFA 80 RESISTANT Resistant     CLINDAMYCIN RESISTANT Resistant     RIFAMPIN <=0.5 SENSITIVE Sensitive     Inducible Clindamycin POSITIVE Resistant     * STAPHYLOCOCCUS EPIDERMIDIS  Urine culture     Status: Abnormal   Collection Time: 09/09/19  8:39 PM   Specimen: In/Out Cath Urine  Result Value Ref Range Status   Specimen Description   Final    IN/OUT CATH URINE Performed at [facility], [place] [address], [place], [place] [address]    Special Requests   Final    NONE Performed at [facility], [place] [address], [place], [place] [address]    Culture (A)  Final    60,000 COLONIES/mL MULTIPLE SPECIES PRESENT, SUGGEST RECOLLECTION   Report Status 09/11/2019 FINAL  Final  SARS CORONAVIRUS 2 (TAT 6-24 HRS) Nasopharyngeal Nasopharyngeal Swab     Status: None   Collection Time: 09/09/19  8:53 PM   Specimen: Nasopharyngeal Swab  Result Value Ref Range Status   SARS Coronavirus 2 NEGATIVE NEGATIVE Final    Comment: (NOTE) SARS-CoV-2 target nucleic acids are NOT DETECTED. The SARS-CoV-2 RNA is generally detectable in upper and lower respiratory specimens during the acute phase of infection. Negative results do not preclude SARS-CoV-2 infection, do not rule out co-infections with other pathogens, and should not be used  as the sole basis for treatment or other patient management decisions. Negative results must be combined with clinical observations, patient history, and epidemiological information. The expected result is Negative. Fact Sheet for Patients: SugarRoll.be Fact Sheet for Healthcare Providers: https://www.woods-mathews.com/ This test is not yet approved or cleared by the [place] FDA and  has been authorized for detection and/or diagnosis of SARS-CoV-2 by FDA under an Emergency Use Authorization (EUA). This EUA will remain  in  effect (meaning this test can be used) for the duration of the COVID-19 declaration under Section 56 4(b)(1) of the Act, 21 U.S.C. section 360bbb-3(b)(1), unless the authorization is terminated or revoked sooner. Performed at [facility], [place] [address], [place], [place] [address]   MRSA PCR Screening     Status: None   Collection Time: 09/09/19 11:58 PM   Specimen: Nasal Mucosa; Nasopharyngeal  Result Value Ref Range Status   MRSA by PCR NEGATIVE NEGATIVE Final    Comment:        The GeneXpert MRSA Assay (FDA approved for NASAL specimens only), is one component of a comprehensive MRSA colonization surveillance program. It is not intended to diagnose MRSA infection nor to guide or monitor treatment for MRSA infections. Performed at [facility], [place] [address], [place], [place] [address]   Respiratory Panel by PCR     Status: None   Collection Time: 09/10/19 10:00 AM   Specimen: Nasopharyngeal Swab; Respiratory  Result Value Ref Range Status   Adenovirus NOT DETECTED NOT DETECTED Final   Coronavirus 229E NOT DETECTED NOT DETECTED Final    Comment: (NOTE) The Coronavirus on the Respiratory Panel, DOES NOT test for the novel  Coronavirus (2019 nCoV)    Coronavirus HKU1 NOT DETECTED NOT DETECTED Final   Coronavirus NL63 NOT DETECTED NOT DETECTED Final   Coronavirus OC43 NOT DETECTED  NOT DETECTED Final   Metapneumovirus NOT DETECTED NOT DETECTED Final   Rhinovirus / Enterovirus NOT DETECTED NOT DETECTED Final   Influenza A NOT DETECTED NOT DETECTED Final   Influenza B NOT DETECTED NOT DETECTED Final   Parainfluenza Virus 1 NOT DETECTED NOT DETECTED Final   Parainfluenza Virus 2 NOT DETECTED NOT DETECTED Final   Parainfluenza Virus 3 NOT DETECTED NOT DETECTED Final   Parainfluenza Virus 4 NOT DETECTED NOT DETECTED Final   Respiratory Syncytial Virus NOT DETECTED NOT DETECTED Final   Bordetella pertussis NOT DETECTED NOT DETECTED Final   Chlamydophila pneumoniae NOT DETECTED NOT DETECTED Final   Mycoplasma pneumoniae NOT DETECTED NOT DETECTED Final    Comment: Performed at [facility], [address], [place], [place] [address]  Culture, blood (routine x 2)     Status: None (Preliminary result)   Collection Time: 09/12/19 10:54 AM   Specimen: BLOOD  Result Value Ref Range Status   Specimen Description   Final    BLOOD BLOOD RIGHT ARM Performed at [facility] [address], [place], [place] [address]    Special Requests   Final    BAA Blood Culture adequate volume Performed at [facility] [address], [place], [place] [address]    Culture   Final    NO GROWTH 2 DAYS Performed at [facility] [address], [place], [place] [address]    Report Status PENDING  Incomplete  Culture, blood (routine x 2)     Status: None (Preliminary result)   Collection Time: 09/12/19 10:54 AM   Specimen: BLOOD  Result Value Ref Range Status   Specimen Description   Final    BLOOD BLOOD RIGHT HAND Performed at [facility] [address] [place], [place] [address]    Special Requests   Final    BAA Blood Culture adequate volume Performed at [facility] [address] [place], [place] [address]    Culture   Final    NO GROWTH 2 DAYS Performed at [facility], [place] [address], [place], [place] [address]    Report Status PENDING  Incomplete  Cath Tip Culture     Status: None (Preliminary result)   Collection Time: 09/13/19 12:20 PM   Specimen: Foreign Body Removal; Other  Result Value Ref Range Status   Specimen Description   Final    CATH TIP Performed at [facility], [place] [address] [place], [place] [address]    Special Requests   Final    NONE Performed at [facility], [place] [address] [place], [place] [address]    Culture   Final    NO GROWTH < 24 HOURS Performed at [facility] [address], [place], [place] [address]    Report Status PENDING  Incomplete    Studies/Results: No results found.   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  She can then have PIC or repeat port Two weeks of vanco from 09-13-19 (end date 10/20/2019) Available as needed.           Bobby Rumpf MD, FACP Infectious Diseases (pager) [phone] [email or web address] 09/14/2019, 9:10 AM  LOS: 4 days

## 2019-09-14 NOTE — Progress Notes (Signed)
1 Day Post-Op    CC: Sepsis  Subjective: Port site looks fine after removal of port.  She is ordering a soft diet.   Objective: Vital signs in last 24 hours: Temp:  [97.5 F (36.4 C)-98.6 F (37 C)] 97.5 F (36.4 C) (10/21 0530) Pulse Rate:  [81-99] 81 (10/21 0530) Resp:  [13-20] 20 (10/21 0530) BP: (116-148)/(63-80) 143/68 (10/21 0530) SpO2:  [96 %-99 %] 99 % (10/21 0530) Last BM Date: 09/13/19  Intake/Output from previous day: 10/20 0701 - 10/21 0700 In: 1100 [I.V.:900; IV Piggyback:200] Out: 0  Intake/Output this shift: No intake/output data recorded.  General appearance: alert, cooperative and no distress Chest wall: Left-sided chest port removal site looks fine.  Lab Results:  Recent Labs    09/13/19 1400 09/14/19 0544  WBC 4.2 4.8  HGB 9.8* 9.8*  HCT 30.9* 31.0*  PLT 183 204    BMET Recent Labs    09/13/19 1400 09/14/19 0544  NA 138 137  K 4.2 3.9  CL 105 105  CO2 25 24  GLUCOSE 120* 113*  BUN 32* 30*  CREATININE 0.54 0.61  CALCIUM 9.0 8.9   PT/INR No results for input(s): LABPROT, INR in the last 72 hours.  Recent Labs  Lab 09/10/19 0428 09/11/19 0254 09/12/19 0529 09/13/19 1400 09/14/19 0544  AST 24 46* 29 23 23   ALT 22 49* 36 29 29  ALKPHOS 84 95 85 88 85  BILITOT 0.5 0.4 0.1* 0.7 0.6  PROT 5.9* 6.3* 5.8* 6.6 6.4*  ALBUMIN 3.0* 3.1* 2.8* 3.4* 3.2*     Lipase     Component Value Date/Time   LIPASE 40 09/09/2019 2050     Medications: . Chlorhexidine Gluconate Cloth  6 each Topical Daily  . enoxaparin (LOVENOX) injection  40 mg Subcutaneous Daily  . feeding supplement (ENSURE ENLIVE)  237 mL Oral BID BM  . feeding supplement (PRO-STAT SUGAR FREE 64)  30 mL Oral BID  . fluticasone  1 spray Each Nare Daily  . insulin aspart  0-9 Units Subcutaneous TID WC  . levothyroxine  50 mcg Oral Q0600  . loratadine  10 mg Oral Daily  . mouth rinse  15 mL Mouth Rinse BID  . pantoprazole  40 mg Oral Daily  . sodium chloride flush  10-40  mL Intracatheter Q12H   . vancomycin 1,000 mg (09/13/19 1347)    Assessment/Plan Sepsis - Blood cultures pending Pancreatic CA - Whipple procedure planned 09/30/2019 Hypothyroid Partial gastric outlet obstruction Malnutrition  Infected port Removal of Port-A-Cath 09/13/2019, Dr. Stark Klein  FEN: Soft diet ID: cefepime 10/17-10/19; Flagyl 10/16-10/19; Vancomycin 10/16>>  DVT:  Lovenox Follow up:  TBD  Plan:  Port site looks fine.  She is going to try a soft diet.  Surgery scheduled for November.   LOS: 4 days    Kenney Going 09/14/2019 Please see Amion

## 2019-09-14 NOTE — Progress Notes (Signed)
PROGRESS NOTE    Julie Rollins  [id number] DOB: [birth year] DOA: 09/09/2019 PCP: Nolene Ebbs, MD   Brief Narrative: Julie Rollins is a 73 y.o. female with medical history significant of pancreatic cancer, breast cancer status post lumpectomy and radiation, hypertension, hypothyroidism presented initially with fever of unknown origin. Now treating form MRSE bacteremia d/t line sepsis, port removed 10/20. ID, Oncology and CCS seen.   Assessment & Plan:   Active Problems:   Generalized weakness   Pancreatic mass   Hypothyroidism   Port-A-Cath in place   Partial gastric outlet obstruction   Moderate protein-calorie malnutrition (HCC)   Sepsis (HCC)   Malignant neoplasm of head of pancreas (HCC)   MRSE Bacteremia withSepsis - Sepsis present on admission - presented initially with fever of unknown origin. Now treating form MRSE bacteremia d/t line sepsis, port removed 10/20. - Continue IV Vancomycin - PEr ID follow up, line holiday until final BCx from 10/19 negative, then place PICC and complete Vanc 11/3 - improved  Facial pain Patient with pain around maxillary/frontal sinuses. No sinusitis on CT maxillofacial. -Claritin, Flonase trial. Resolved - Today complained of intermittent R earache. No acute findings on Otoscopy.  Pancreatic mass Plan for Whipple procedure in November. Follows with oncology.  Hypothyroidism Normal TSH -Continue Synthroid  Partial gastric outlet obstruction -Hold TPN since treating as true bacteremia - Tolerating soft diet  Tachycardia Likely secondary to sepsis. Chest CTA negative for PE. Resolved.  Moderate malnutrition -Dietitian consulted   DVT prophylaxis: Lovenox Code Status:   Code Status: Full Code Family Communication: None at bedside Disposition Plan: Discharge pending sepsis management since there is a high concern for persistent bacteremia   Consultants:   Infectious disease  Medical oncology  CCS   Procedures:   Porta Cath removal by CCS 10/20  Antimicrobials:  Vancomycin (10/16>>  Cefepime (10/16>>10/19  Flagyl (10/16>>10/19)   Subjective: Feels better. Tolerated soft diet. R ear ache. Chronic tinnitus.  Objective: Vitals:   09/13/19 1315 09/13/19 2028 09/14/19 0530 09/14/19 1424  BP: 119/66 (!) 143/78 (!) 143/68 (!) 122/51  Pulse: 90 99 81 90  Resp: 16 20 20 18   Temp: (!) 97.5 F (36.4 C) 97.9 F (36.6 C) (!) 97.5 F (36.4 C) 98 F (36.7 C)  TempSrc:  Oral Oral Oral  SpO2: 97% 96% 99% 99%  Weight:      Height:        Intake/Output Summary (Last 24 hours) at 09/14/2019 1944 Last data filed at 09/14/2019 1500 Gross per 24 hour  Intake 350 ml  Output -  Net 350 ml   Filed Weights   09/09/19 2029 09/10/19 0925 09/12/19 0530  Weight: 59.4 kg 63.2 kg 61.2 kg    Examination:  General exam: Appears calm and comfortable Respiratory system: Clear to auscultation. Respiratory effort normal. Cardiovascular system: S1 & S2 heard, RRR. No murmurs, rubs, gallops or clicks. Gastrointestinal system: Abdomen is nondistended, soft and nontender. No organomegaly or masses felt. Normal bowel sounds heard. Central nervous system: Alert and oriented. No focal neurological deficits. Extremities: No edema. No calf tenderness Skin: No cyanosis. No rashes Psychiatry: Judgement and insight appear normal. Mood & affect appropriate.   ENT; R ear exam by Otoscopy w/o acute findings.    Data Reviewed: I have personally reviewed following labs and imaging studies  CBC: Recent Labs  Lab 09/09/19 2050 09/10/19 0428 09/11/19 0254 09/12/19 0529 09/13/19 1400 09/14/19 0544  WBC 9.1 6.9 4.2 2.4* 4.2 4.8  NEUTROABS 8.1*  --  3.6 1.4* 3.5  --   HGB 11.1* 9.8* 9.9* 9.6* 9.8* 9.8*  HCT 33.9* 31.1* 30.9* 30.5* 30.9* 31.0*  MCV 102.4* 103.3* 102.7* 104.1* 102.7* 103.0*  PLT 234 192 162 151 183 0000000   Basic Metabolic Panel: Recent Labs  Lab 09/09/19 2050 09/10/19 0428  09/11/19 0254 09/12/19 0529 09/13/19 0529 09/13/19 1400 09/14/19 0544  NA 138 138 138 139 140 138 137  K 3.8 3.6 3.8 4.4 4.8 4.2 3.9  CL 102 106 109 109 108 105 105  CO2 26 23 21* 23 24 25 24   GLUCOSE 154* 160* 192* 149* 122* 120* 113*  BUN 36* 28* 23 28* 26* 32* 30*  CREATININE 0.82 0.69 0.55 0.46 0.55 0.54 0.61  CALCIUM 9.8 8.4* 8.7* 8.8* 9.0 9.0 8.9  MG 2.1 1.9 2.2 2.2  --   --  2.3  PHOS 3.7 2.9 2.2* 2.7  --   --  4.3   GFR: Estimated Creatinine Clearance: 52.5 mL/min (by C-G formula based on SCr of 0.61 mg/dL). Liver Function Tests: Recent Labs  Lab 09/10/19 0428 09/11/19 0254 09/12/19 0529 09/13/19 1400 09/14/19 0544  AST 24 46* 29 23 23   ALT 22 49* 36 29 29  ALKPHOS 84 95 85 88 85  BILITOT 0.5 0.4 0.1* 0.7 0.6  PROT 5.9* 6.3* 5.8* 6.6 6.4*  ALBUMIN 3.0* 3.1* 2.8* 3.4* 3.2*   Recent Labs  Lab 09/09/19 2050  LIPASE 40   No results for input(s): AMMONIA in the last 168 hours. Coagulation Profile: Recent Labs  Lab 09/09/19 2050  INR 1.0   Cardiac Enzymes: No results for input(s): CKTOTAL, CKMB, CKMBINDEX, TROPONINI in the last 168 hours. BNP (last 3 results) No results for input(s): PROBNP in the last 8760 hours. HbA1C: No results for input(s): HGBA1C in the last 72 hours. CBG: Recent Labs  Lab 09/12/19 2002 09/13/19 1110 09/13/19 1632 09/14/19 1153 09/14/19 1614  GLUCAP 141* 144* 174* 139* 140*   Lipid Profile: Recent Labs    09/12/19 0529  TRIG 63   Thyroid Function Tests: No results for input(s): TSH, T4TOTAL, FREET4, T3FREE, THYROIDAB in the last 72 hours. Anemia Panel: No results for input(s): VITAMINB12, FOLATE, FERRITIN, TIBC, IRON, RETICCTPCT in the last 72 hours. Sepsis Labs: Recent Labs  Lab 09/09/19 2050 09/10/19 0428  PROCALCITON  --  0.21  LATICACIDVEN 1.3 1.2    Recent Results (from the past 240 hour(s))  Blood Culture (routine x 2)     Status: Abnormal   Collection Time: 09/09/19  8:28 PM   Specimen: Line, Central;  Blood  Result Value Ref Range Status   Specimen Description   Final    BLOOD LEFT ANTECUBITAL Performed at [facility], [place] [address], [place], [place] [address]    Special Requests   Final    BOTTLES DRAWN AEROBIC AND ANAEROBIC Blood Culture results may not be optimal due to an excessive volume of blood received in culture bottles Performed at [facility] [address] [place], [place] [address]    Culture  Setup Time   Final    GRAM POSITIVE COCCI IN BOTH AEROBIC AND ANAEROBIC BOTTLES CRITICAL RESULT CALLED TO, READ BACK BY AND VERIFIED WITH: PHARMD A PHAM [id number] AT 62 BY CM Performed at [facility], [place] [address], [place], [place] [address]    Culture STAPHYLOCOCCUS EPIDERMIDIS (A)  Final   Report Status 09/12/2019 FINAL  Final   Organism ID, Bacteria STAPHYLOCOCCUS EPIDERMIDIS  Final  Susceptibility   Staphylococcus epidermidis - MIC*    CIPROFLOXACIN >=8 RESISTANT Resistant     ERYTHROMYCIN >=8 RESISTANT Resistant     GENTAMICIN <=0.5 SENSITIVE Sensitive     OXACILLIN >=4 RESISTANT Resistant     TETRACYCLINE 2 SENSITIVE Sensitive     VANCOMYCIN 1 SENSITIVE Sensitive     TRIMETH/SULFA 80 RESISTANT Resistant     CLINDAMYCIN RESISTANT Resistant     RIFAMPIN <=0.5 SENSITIVE Sensitive     Inducible Clindamycin POSITIVE Resistant     * STAPHYLOCOCCUS EPIDERMIDIS  Urine culture     Status: Abnormal   Collection Time: 09/09/19  8:39 PM   Specimen: In/Out Cath Urine  Result Value Ref Range Status   Specimen Description   Final    IN/OUT CATH URINE Performed at [facility] [address], [place], [place] [address]    Special Requests   Final    NONE Performed at [facility], [place] [address] [place], [place] [address]    Culture (A)  Final    60,000 COLONIES/mL MULTIPLE SPECIES PRESENT, SUGGEST RECOLLECTION   Report Status 09/11/2019 FINAL  Final  SARS CORONAVIRUS 2 (TAT 6-24 HRS)  Nasopharyngeal Nasopharyngeal Swab     Status: None   Collection Time: 09/09/19  8:53 PM   Specimen: Nasopharyngeal Swab  Result Value Ref Range Status   SARS Coronavirus 2 NEGATIVE NEGATIVE Final    Comment: (NOTE) SARS-CoV-2 target nucleic acids are NOT DETECTED. The SARS-CoV-2 RNA is generally detectable in upper and lower respiratory specimens during the acute phase of infection. Negative results do not preclude SARS-CoV-2 infection, do not rule out co-infections with other pathogens, and should not be used as the sole basis for treatment or other patient management decisions. Negative results must be combined with clinical observations, patient history, and epidemiological information. The expected result is Negative. Fact Sheet for Patients: SugarRoll.be Fact Sheet for Healthcare Providers: https://www.woods-mathews.com/ This test is not yet approved or cleared by the [place] FDA and  has been authorized for detection and/or diagnosis of SARS-CoV-2 by FDA under an Emergency Use Authorization (EUA). This EUA will remain  in effect (meaning this test can be used) for the duration of the COVID-19 declaration under Section 56 4(b)(1) of the Act, 21 U.S.C. section 360bbb-3(b)(1), unless the authorization is terminated or revoked sooner. Performed at [facility], [place] [address] [place], [place] [address]   MRSA PCR Screening     Status: None   Collection Time: 09/09/19 11:58 PM   Specimen: Nasal Mucosa; Nasopharyngeal  Result Value Ref Range Status   MRSA by PCR NEGATIVE NEGATIVE Final    Comment:        The GeneXpert MRSA Assay (FDA approved for NASAL specimens only), is one component of a comprehensive MRSA colonization surveillance program. It is not intended to diagnose MRSA infection nor to guide or monitor treatment for MRSA infections. Performed at [facility], [place] [address],  [place], [place] [address]   Respiratory Panel by PCR     Status: None   Collection Time: 09/10/19 10:00 AM   Specimen: Nasopharyngeal Swab; Respiratory  Result Value Ref Range Status   Adenovirus NOT DETECTED NOT DETECTED Final   Coronavirus 229E NOT DETECTED NOT DETECTED Final    Comment: (NOTE) The Coronavirus on the Respiratory Panel, DOES NOT test for the novel  Coronavirus (2019 nCoV)    Coronavirus HKU1 NOT DETECTED NOT DETECTED Final   Coronavirus NL63 NOT DETECTED  NOT DETECTED Final   Coronavirus OC43 NOT DETECTED NOT DETECTED Final   Metapneumovirus NOT DETECTED NOT DETECTED Final   Rhinovirus / Enterovirus NOT DETECTED NOT DETECTED Final   Influenza A NOT DETECTED NOT DETECTED Final   Influenza B NOT DETECTED NOT DETECTED Final   Parainfluenza Virus 1 NOT DETECTED NOT DETECTED Final   Parainfluenza Virus 2 NOT DETECTED NOT DETECTED Final   Parainfluenza Virus 3 NOT DETECTED NOT DETECTED Final   Parainfluenza Virus 4 NOT DETECTED NOT DETECTED Final   Respiratory Syncytial Virus NOT DETECTED NOT DETECTED Final   Bordetella pertussis NOT DETECTED NOT DETECTED Final   Chlamydophila pneumoniae NOT DETECTED NOT DETECTED Final   Mycoplasma pneumoniae NOT DETECTED NOT DETECTED Final    Comment: Performed at [facility], [place] [address], [place], [place] [address]  Culture, blood (routine x 2)     Status: None (Preliminary result)   Collection Time: 09/12/19 10:54 AM   Specimen: BLOOD  Result Value Ref Range Status   Specimen Description   Final    BLOOD BLOOD RIGHT ARM Performed at [facility] [address], [place], [place] [address]    Special Requests   Final    BAA Blood Culture adequate volume Performed at [facility] [address] [place], [place] [address]    Culture   Final    NO GROWTH 2 DAYS Performed at [facility] [address] [place], [place] [address]    Report Status PENDING  Incomplete  Culture,  blood (routine x 2)     Status: None (Preliminary result)   Collection Time: 09/12/19 10:54 AM   Specimen: BLOOD  Result Value Ref Range Status   Specimen Description   Final    BLOOD BLOOD RIGHT HAND Performed at [facility] [address], [place], [place] [address]    Special Requests   Final    BAA Blood Culture adequate volume Performed at [facility] [address] [place], [place] [address]    Culture   Final    NO GROWTH 2 DAYS Performed at [facility] [address], [place], [place] [address]    Report Status PENDING  Incomplete  Cath Tip Culture     Status: None (Preliminary result)   Collection Time: 09/13/19 12:20 PM   Specimen: Foreign Body Removal; Other  Result Value Ref Range Status   Specimen Description   Final    CATH TIP Performed at [facility], [place] [address], [place], [place] [address]    Special Requests   Final    NONE Performed at [facility], [place] [address] [place], [place] [address]    Culture   Final    NO GROWTH < 24 HOURS Performed at [facility] [address] [place], [place] [address]    Report Status PENDING  Incomplete         Radiology Studies: No results found.      Scheduled Meds: . Chlorhexidine Gluconate Cloth  6 each Topical Daily  . enoxaparin (LOVENOX) injection  40 mg Subcutaneous Daily  . feeding supplement (ENSURE ENLIVE)  237 mL Oral BID BM  . feeding supplement (PRO-STAT SUGAR FREE 64)  30 mL Oral BID  . fluticasone  1 spray Each Nare Daily  . insulin aspart  0-9 Units Subcutaneous TID WC  . levothyroxine  50 mcg Oral Q0600  . loratadine  10 mg Oral Daily  . mouth rinse  15 mL Mouth Rinse BID  . pantoprazole  40 mg Oral Daily  . sodium chloride flush  10-40 mL Intracatheter Q12H   Continuous Infusions: . vancomycin 1,000 mg (09/14/19 0952)     LOS: 4 days     Julie Leep, MD, FACP, [facility]. [facility]   To contact the attending provider between 7A-7P or the covering provider during after hours 7P-7A, please log into the web site [email or web address] and access using universal [place] password for that web site. If you do not have the password, please call the hospital operator.

## 2019-09-14 NOTE — Progress Notes (Signed)
HEMATOLOGY-ONCOLOGY PROGRESS NOTE  SUBJECTIVE: pt had her port removed by Dr. Barry Dienes yesterday.  She is clinically doing well, afebrile, has been eating well overall.  No nausea or significant other complaints.   REVIEW OF SYSTEMS:   As noted in the HPI.  I have reviewed the past medical history, past surgical history, social history and family history with the patient and they are unchanged from previous note.   PHYSICAL EXAMINATION: ECOG PERFORMANCE STATUS: 2 - Symptomatic, <50% confined to bed  Vitals:   09/14/19 0530 09/14/19 1424  BP: (!) 143/68 (!) 122/51  Pulse: 81 90  Resp: 20 18  Temp: (!) 97.5 F (36.4 C) 98 F (36.7 C)  SpO2: 99% 99%   Filed Weights   09/09/19 2029 09/10/19 0925 09/12/19 0530  Weight: 131 lb (59.4 kg) 139 lb 5.3 oz (63.2 kg) 134 lb 14.7 oz (61.2 kg)    Intake/Output from previous day: 10/20 0701 - 10/21 0700 In: 1100 [I.V.:900; IV Piggyback:200] Out: 0   GENERAL:alert, no distress and comfortable SKIN: skin color, texture, turgor are normal, no rashes or significant lesions EYES: normal, Conjunctiva are pink and non-injected, sclera clear OROPHARYNX:no exudate, no erythema and lips, buccal mucosa, and tongue normal  NECK: supple, thyroid normal size, non-tender, without nodularity LYMPH:  no palpable lymphadenopathy in the cervical, axillary or inguinal CHEST: Left chest Port-A-Cath without redness, swelling, or discomfort with palpation. LUNGS: clear to auscultation and percussion with normal breathing effort HEART: regular rate & rhythm and no murmurs and no lower extremity edema ABDOMEN:abdomen soft, non-tender and normal bowel sounds Musculoskeletal:no cyanosis of digits and no clubbing  NEURO: alert & oriented x 3 with fluent speech, no focal motor/sensory deficits  LABORATORY DATA:  I have reviewed the data as listed CMP Latest Ref Rng & Units 09/14/2019 09/13/2019 09/13/2019  Glucose 70 - 99 mg/dL 113(H) 120(H) 122(H)  BUN 8 - 23  mg/dL 30(H) 32(H) 26(H)  Creatinine 0.44 - 1.00 mg/dL 0.61 0.54 0.55  Sodium 135 - 145 mmol/L 137 138 140  Potassium 3.5 - 5.1 mmol/L 3.9 4.2 4.8  Chloride 98 - 111 mmol/L 105 105 108  CO2 22 - 32 mmol/L 24 25 24   Calcium 8.9 - 10.3 mg/dL 8.9 9.0 9.0  Total Protein 6.5 - 8.1 g/dL 6.4(L) 6.6 -  Total Bilirubin 0.3 - 1.2 mg/dL 0.6 0.7 -  Alkaline Phos 38 - 126 U/L 85 88 -  AST 15 - 41 U/L 23 23 -  ALT 0 - 44 U/L 29 29 -    Lab Results  Component Value Date   WBC 4.8 09/14/2019   HGB 9.8 (L) 09/14/2019   HCT 31.0 (L) 09/14/2019   MCV 103.0 (H) 09/14/2019   PLT 204 09/14/2019   NEUTROABS 3.5 09/13/2019    Ct Angio Chest Pe W Or Wo Contrast  Result Date: 09/10/2019 CLINICAL DATA:  Fevers, chills and fatigue began at 1 p.m. after connecting TPN, history of pancreatic cancer. EXAM: CT ANGIOGRAPHY CHEST WITH CONTRAST TECHNIQUE: Multidetector CT imaging of the chest was performed using the standard protocol during bolus administration of intravenous contrast. Multiplanar CT image reconstructions and MIPs were obtained to evaluate the vascular anatomy. CONTRAST:  139mL OMNIPAQUE IOHEXOL 350 MG/ML SOLN COMPARISON:  CT 08/15/2019 FINDINGS: Cardiovascular: Satisfactory opacification the pulmonary arteries to the segmental level however segmental evaluation is limited given respiratory motion artifact which is most pronounced in the lung bases. No central or lobar pulmonary emboli are identified. Central pulmonary arteries are normal caliber.  No CT evidence of right heart strain. Borderline cardiac enlargement is similar to prior. Insert coronary no pericardial effusion. Normal caliber thoracic aorta with a 3 vessel aortic arch. No acute abnormality of the aorta. Tunnel left subclavian approach Port-A-Cath tip terminates at the superior cavoatrial junction. The Port-A-Cath is currently accessed at this time. Other major venous structures are unremarkable. Mediastinum/Nodes: No enlarged mediastinal,  hilar or axillary lymph nodes. No acute abnormality of the trachea aside from posterior bowing reflecting imaging during exhalation. Small hiatal hernia. No acute abnormality of the esophagus. Lungs/Pleura: There is extensive respiratory motion in the bases which limits evaluation of the lung parenchyma. There are bandlike areas of opacity in the lung bases which may reflect a combination of atelectasis and/or scarring. Suspect mild centrilobular emphysema. No visible concerning nodules or masses. Upper Abdomen: Predominant left hepatic pneumobilia. Likely related to prior surgery. Remaining portions of the included upper abdomen are unremarkable. Musculoskeletal: Multilevel degenerative changes are present in the imaged portions of the spine. Stable superior endplate compression deformity of T12 with 30% height loss anteriorly. Exaggerated thoracic kyphosis is similar to prior. Remote healed sternal fracture is again noted. Review of the MIP images confirms the above findings. IMPRESSION: 1. Imaging quality is degraded by respiratory motion limiting evaluation of the segmental and subsegmental pulmonary arteries and parenchymal portions of the lung bases. 2. No evidence of central or lobar pulmonary emboli. 3. Bandlike areas of opacity in the lung bases likely reflect a combination of atelectasis scarring. No other acute intrathoracic process. 4. Aortic Atherosclerosis (ICD10-I70.0). 5. Stable T12 compression deformity or remote healed sternal fracture. Electronically Signed   By: Lovena Le M.D.   On: 09/10/2019 01:52   Ir Cv Line Injection  Result Date: 08/23/2019 INDICATION: Malfunctioning port catheter, receiving chemotherapy and TPN EXAM: Fluoroscopic left subclavian port catheter injection MEDICATIONS: None. ANESTHESIA/SEDATION: Moderate Sedation Time: None. The patient's level of consciousness and vital signs were monitored continuously by radiology nursing throughout the procedure under my direct  supervision. FLUOROSCOPY TIME:  Fluoroscopy Time: 0 minutes 6 seconds (3 mGy). COMPLICATIONS: None immediate. PROCEDURE: Informed written consent was obtained from the patient after a thorough discussion of the procedural risks, benefits and alternatives. All questions were addressed. Maximal Sterile Barrier Technique was utilized including caps, mask, sterile gowns, sterile gloves, sterile drape, hand hygiene and skin antiseptic. A timeout was performed prior to the initiation of the procedure. Under sterile conditions, the left subclavian access port catheter was injected contrast. Fluoroscopic subtraction imaging. Left subclavian port catheter is intact and patent. Tip is in the proximal SVC. Small amount of fibrin sheath at the tip noted. No occlusion. IMPRESSION: Left subclavian power port catheter tip proximal SVC. Small amount of tip fibrin sheath noted. Electronically Signed   By: Jerilynn Mages.  Shick M.D.   On: 08/23/2019 10:22   Dg Chest Port 1 View  Result Date: 09/09/2019 CLINICAL DATA:  Fever and chills. EXAM: PORTABLE CHEST 1 VIEW COMPARISON:  July 17, 2019 FINDINGS: Stable left Port-A-Cath. No pneumothorax. The heart, hila, mediastinum, lungs, and pleura are unremarkable. IMPRESSION: No active disease. Electronically Signed   By: Dorise Bullion III M.D   On: 09/09/2019 20:58   Ct Maxillofacial Wo Contrast  Result Date: 09/11/2019 CLINICAL DATA:  Fever of unknown origin. Question sinusitis. EXAM: CT MAXILLOFACIAL WITHOUT CONTRAST TECHNIQUE: Multidetector CT imaging of the maxillofacial structures was performed. Multiplanar CT image reconstructions were also generated. COMPARISON:  None. FINDINGS: Osseous: No focal lytic or blastic lesion is present. There is  no acute or healing fracture. Orbits: Bilateral lens replacements are noted. Globes and orbits are otherwise unremarkable. Sinuses: The paranasal sinuses and mastoid air cells are clear. Soft tissues: Soft tissues the face are unremarkable.  Limited intracranial: Within normal limits IMPRESSION: Negative CT of the face. Electronically Signed   By: San Morelle M.D.   On: 09/11/2019 19:01    ASSESSMENT AND PLAN: 1.  Adenocarcinoma the pancreatic head 2.  Sepsis and MRSE bacteriemia  3.  Partial gastric outlet obstruction, resolved now  4.  Mild neutropenia secondary to chemotherapy 5.  Anemia  6.  Malnutrition  -port removed, TNP on hold for now -she is eating well overall, drinking Ensures, I encourage her to have 2-4 bottles a day  -nutrition consult and calorie count, if she is able to take enough nutrition by mouth, probably will be ok without TNP. But if not, she may need PICC line to resume TPN when repeated blood culture became negative -I told pt to hold [place] for now, until she completes course of antibiotics. -I will set up her f/u after discharge  -I will be out of office from tomorrow to next Tuesday, please call my on-call partner if needed. Our inpt NP Erasmo Downer is available to see her if needed.   Truitt Merle  09/14/2019

## 2019-09-15 ENCOUNTER — Inpatient Hospital Stay (HOSPITAL_COMMUNITY): Payer: Medicare HMO

## 2019-09-15 DIAGNOSIS — I340 Nonrheumatic mitral (valve) insufficiency: Secondary | ICD-10-CM

## 2019-09-15 DIAGNOSIS — I361 Nonrheumatic tricuspid (valve) insufficiency: Secondary | ICD-10-CM

## 2019-09-15 LAB — GLUCOSE, CAPILLARY
Glucose-Capillary: 161 mg/dL — ABNORMAL HIGH (ref 70–99)
Glucose-Capillary: 90 mg/dL (ref 70–99)
Glucose-Capillary: 143 mg/dL — ABNORMAL HIGH (ref 70–99)

## 2019-09-15 LAB — VANCOMYCIN, PEAK: Vancomycin Pk: 29 ug/mL — ABNORMAL LOW (ref 30–40)

## 2019-09-15 LAB — COMPREHENSIVE METABOLIC PANEL
ALT: 26 U/L (ref 0–44)
AST: 22 U/L (ref 15–41)
Albumin: 2.9 g/dL — ABNORMAL LOW (ref 3.5–5.0)
Alkaline Phosphatase: 77 U/L (ref 38–126)
Anion gap: 9 (ref 5–15)
BUN: 24 mg/dL — ABNORMAL HIGH (ref 8–23)
CO2: 23 mmol/L (ref 22–32)
Calcium: 8.6 mg/dL — ABNORMAL LOW (ref 8.9–10.3)
Chloride: 107 mmol/L (ref 98–111)
Creatinine, Ser: 0.53 mg/dL (ref 0.44–1.00)
GFR calc Af Amer: >60 mL/min (ref >60)
GFR calc non Af Amer: >60 mL/min (ref >60)
Glucose, Bld: 101 mg/dL — ABNORMAL HIGH (ref 70–99)
Potassium: 3.7 mmol/L (ref 3.5–5.1)
Sodium: 139 mmol/L (ref 135–145)
Total Bilirubin: 0.5 mg/dL (ref 0.3–1.2)
Total Protein: 5.8 g/dL — ABNORMAL LOW (ref 6.5–8.1)

## 2019-09-15 LAB — CATH TIP CULTURE: Culture: NO GROWTH

## 2019-09-15 LAB — ECHOCARDIOGRAM COMPLETE
Height: 64 in
Weight: 2158.74 oz

## 2019-09-15 LAB — PHOSPHORUS: Phosphorus: 3.4 mg/dL (ref 2.5–4.6)

## 2019-09-15 LAB — MAGNESIUM: Magnesium: 2 mg/dL (ref 1.7–2.4)

## 2019-09-15 LAB — VANCOMYCIN, TROUGH: Vancomycin Tr: 9 ug/mL — ABNORMAL LOW (ref 15–20)

## 2019-09-15 MED ORDER — SODIUM CHLORIDE 0.9% FLUSH: 10.0000 mL | INTRAVENOUS | Status: DC | PRN

## 2019-09-15 MED ORDER — SODIUM CHLORIDE 0.9% FLUSH
10.0000 mL | Freq: Two times a day (BID) | INTRAVENOUS | Status: DC
  Administered 2019-09-15 – 2019-09-17 (×3): 10 mL

## 2019-09-15 MED ORDER — BOOST / RESOURCE BREEZE PO LIQD CUSTOM
1.0000 | Freq: Two times a day (BID) | ORAL | Status: DC
  Administered 2019-09-15 – 2019-09-17 (×3): 1 via ORAL

## 2019-09-15 MED ORDER — VANCOMYCIN HCL 10 G IV SOLR
1250.0000 mg | INTRAVENOUS | Status: DC
  Administered 2019-09-16 – 2019-09-17 (×2): 1250 mg via INTRAVENOUS
  Filled 2019-09-15 (×2): qty 1250

## 2019-09-15 NOTE — Progress Notes (Signed)
Nutrition Follow-up  INTERVENTION:   -Calorie Count (10/21-10/23) -Ensure Enlive po BID, each supplement provides 350 kcal and 20 grams of protein -Magic cup BID with meals, each supplement provides 290 kcal and 9 grams of protein -Prostat liquid protein PO 30 ml BID with meals, each supplement provides 100 kcal, 15 grams protein. -Will add Boost Breeze supplements with meals BID, each provides 250 kcal and 9g protein -Will add chopped meats with gravies and sauces to diet order  NUTRITION DIAGNOSIS:   Increased nutrient needs related to cancer and cancer related treatments as evidenced by estimated needs.  Ongoing.  GOAL:   Patient will meet greater than or equal to 90% of their needs  Progressing.  MONITOR:   PO intake, Supplement acceptance, Labs, Weight trends, I & O's(TPN)  REASON FOR ASSESSMENT:   Consult Assessment of nutrition requirement/status, Calorie Count  ASSESSMENT:   73 y.o. 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 10/20: s/p port-a-cath removal, TPN stopped for line holiday  **RD working remotely**  Calorie count in progress, started this morning. Pt reports consuming most of her breakfast and it is documented as 50% was consumed (~240 kcal, 12g protein). Pt has an improved appetite. States she is still adjusting to consuming solid foods as she was primarily on a full liquid diet at home. Pt reports taste changes that don't help her intakes.   States she is currently eating her lunch and she finds the chicken she ordered to be too dry to eat as well as the peas have no flavor. She is still trying to eat some of her lunch however. States she has no trouble with her supplements and has been drinking all of  her Ensures and has been provided some [employer] as well from floor. Will add this supplement to orders.  Will adjust diet order to include chopped meats with gravies and sauces.   Admission weight: 131 lbs. Current weight (10/19): 134 lbs. I/Os: +6.8L since admit  Medications reviewed. Labs reviewed: CBGs: 140-161  Diet Order:   Diet Order            DIET SOFT Room service appropriate? Yes; Fluid consistency: Thin  Diet effective now              EDUCATION NEEDS:   No education needs have been identified at this time  Skin:  Skin Assessment: Reviewed RN Assessment  Last BM:  10/22  Height:   Ht Readings from Last 1 Encounters:  09/09/19 5\' 4"  (1.626 m)    Weight:   Wt Readings from Last 1 Encounters:  09/12/19 61.2 kg    Ideal Body Weight:  54.5 kg  BMI:  Body mass index is 23.16 kg/m.  Estimated Nutritional Needs:   Kcal:  2000-2200  Protein:  100-115g  Fluid:  2L/day  Clayton Bibles, MS, RD, LDN Inpatient Clinical Dietitian Pager: [phone] After Hours Pager: [phone]

## 2019-09-15 NOTE — Progress Notes (Signed)
  Echocardiogram 2D Echocardiogram has been performed.  Randa Lynn Allyna Pittsley 09/15/2019, 1:26 PM

## 2019-09-15 NOTE — Progress Notes (Signed)
Physical Therapy Treatment Patient Details Name: Julie Rollins MRN: [id number] DOB: 09-17-1946 Today's Date: 09/15/2019    History of Present Illness 73 y.o. female with medical history significant of pancreatic cancer, breast cancer status post lumpectomy and radiation, hypertension, hypothyroidism admitted with fever, chills. Dx of sepsis. Whipple procedure planned for November.    PT Comments    Pt making excellent progress.  She demonstrated safe basic gait and transfers.  Did have 1 LOB with dynamic gait.   Cont POC.     Follow Up Recommendations  No PT follow up     Equipment Recommendations  None recommended by PT    Recommendations for Other Services       Precautions / Restrictions      Mobility  Bed Mobility Overal bed mobility: Independent                Transfers Overall transfer level: Modified independent   Transfers: Sit to/from Stand Sit to Stand: Supervision            Ambulation/Gait Ambulation/Gait assistance: Supervision Gait Distance (Feet): 400 Feet         General Gait Details: reciprocal gait; normal speed;1 LOB with head turns   Stairs             Wheelchair Mobility    Modified Rankin (Stroke Patients Only)       Balance   Sitting-balance support: No upper extremity supported;Feet unsupported Sitting balance-Leahy Scale: Normal     Standing balance support: No upper extremity supported Standing balance-Leahy Scale: Good Standing balance comment: Had pt reach all directions outside BOS; side step and walk backwards : No LOB                            Cognition Arousal/Alertness: Awake/alert Behavior During Therapy: WFL for tasks assessed/performed Overall Cognitive Status: Within Functional Limits for tasks assessed                                        Exercises General Exercises - Lower Extremity Heel Raises: AROM;10 reps;Standing(with CGA) Other Exercises Other  Exercises: Sit to stand x 10 with SBA    General Comments        Pertinent Vitals/Pain      Home Living                      Prior Function            PT Goals (current goals can now be found in the care plan section) Progress towards PT goals: Progressing toward goals    Frequency    Min 3X/week      PT Plan Current plan remains appropriate    Co-evaluation              AM-PAC PT "6 Clicks" Mobility   Outcome Measure  Help needed turning from your back to your side while in a flat bed without using bedrails?: None Help needed moving from lying on your back to sitting on the side of a flat bed without using bedrails?: None Help needed moving to and from a bed to a chair (including a wheelchair)?: None Help needed standing up from a chair using your arms (e.g., wheelchair or bedside chair)?: None Help needed to walk in hospital room?: A Little Help needed climbing 3-5 steps  with a railing? : A Little 6 Click Score: 22    End of Session Equipment Utilized During Treatment: Gait belt Activity Tolerance: Patient tolerated treatment well Patient left: Other (comment)(on toilet - RN aware and confirms pt toielting independently) Nurse Communication: Mobility status       Time: [id number] PT Time Calculation (min) (ACUTE ONLY): 20 min  Charges:  $Gait Training: 8-22 mins                     Maggie Font, PT Acute Rehab Services [phone]    [name] 09/15/2019, 2:29 PM

## 2019-09-15 NOTE — Progress Notes (Signed)
OT Cancellation Note  Patient Details Name: SHERETTA SURRETT MRN: [id number] DOB: 1946-07-23   Cancelled Treatment:    Reason Eval/Treat Not Completed: Other (comment). Checked on pt at 11 am and she didn't want to mobilize due to pain in LUE after IV was removed. Did not have a chance to return today. Wil check back tomorrow, if schedule permits.  Lindalee Huizinga 09/15/2019, 3:21 PM  Lesle Chris, OTR/L Acute Rehabilitation Services [phone] [facility] pager [phone] office 09/15/2019

## 2019-09-15 NOTE — Progress Notes (Signed)
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  She is scheduled for Whipple surgery on 10/08/2019 and plan is to continue TPN for nutrition until procedure.  She presented to the ED on 10/16 with c/o fever and chills.  TPN resumed on admission.  Significant events:  10/17: patient's home TPN bag currently running; she stated that she hung bag at 1PM on 10/16; 1 of 2 blood cx bottles with GPC 10/18: Per Dr. Lonny Prude, plan is to consult ID re: possible bacteremia and recom. for port  Today:   Glucose (goal <150): 101-161, 2 units insulin. Sensitive scale tidac on soft diet, TPN off  Electrolytes: wnl  Renal: scr wnl  LFTs: AST/ALT down to wnl, Tbili wnl  TGs: 81 (10/18), 63 (10/19)  Prealbumin: 23.1 (10/17), 20.1 (10/18), 16.6 (10/19)  NUTRITIONAL GOALS                                                                                             RD recs per 24 hr KCal: 2000-2200  Protein: 100 - 115 gm Fluid: 2L  Ensure Enlive: 350 kcal, 20gm protein, ordered bid, charted once 10/18 Magic Cup: 290 kcal, 9gm protein  [facility] TPN 3-in-1 formulation: - 18 hr cyclic TPN (total TPN volume 1500 mL) - AA= 104 gm - dextrose= 100 gm - lipid = 45 gm - Total kcal/day = 1256 kcal - lytes in bag: calcium  gluconate 9.6 meq, magnesium sulfate 8 meq, potassium acetate 98 mew, potassium phosphate 15 mmol, sodium acetate 77 meq - trace elements and MVI  Inpatient Custom 18 hr cyclic TPN with 99991111 ml volume provides: -  105 g/day protein (  70 g/L) -  45 g/day Lipid  ( 30  G/L) --> 37% of total TPN kcal - 105  g/day Dextrose (7 %) -  1227 Kcal/day  PLAN  At 1800 today: Holding TPN again today, "central line holiday" with MRSE bacteremia   18 hr cyclic TPN: 44 ml/hr first and last hour, 88 ml/hr for 16 hrs  Electrolytes in TPN: Standard except K 70 meq/L, Cl:Ac ratio -- max acetate  At goal rate per PTA TPN, additional kcal per liquid diet  TPN to contain standard multivitamins and trace elements.   Change  sensitive SSI to tid ac with soft diet, resume CBGs at (3AM, 1PM, 3PM, 8PM) when TPN resumes  CBGs: 2 hrs past cyclic TPN start + 1 hr past cycle TPN d/c'ed + middle of TPN infusion + while off TPN  Received dexamethasone 5mg  today  TPN lab panels on Mondays & Thursdays.  F/u daily.   Minda Ditto PharmD 09/15/2019,6:36 AM

## 2019-09-15 NOTE — Progress Notes (Signed)
Pharmacy Antibiotic Note  Julie Rollins is a 73 y.o. female admitted on 09/09/2019 with sepsis.  Pharmacy has been consulted for cefepime and vancomycin dosing.  Plan: Vancomycin adj to 1250mg  q24  Per ID note: can replace central line when rpt BCx (10/19) negative & finalized. Patient lost IV access again today after Vancomycin dose infused.   Height: 5\' 4"  (162.6 cm) Weight: 134 lb 14.7 oz (61.2 kg) IBW/kg (Calculated) : 54.7  Temp (24hrs), Avg:98 F (36.7 C), Min:97.8 F (36.6 C), Max:98.3 F (36.8 C)  Recent Labs  Lab 09/09/19 2050 09/10/19 0428 09/11/19 0254 09/12/19 0529 09/13/19 0529 09/13/19 1400 09/14/19 0544 09/14/19 1231 09/15/19 0544 09/15/19 1209  WBC 9.1 6.9 4.2 2.4*  --  4.2 4.8  --   --   --   CREATININE 0.82 0.69 0.55 0.46 0.55 0.54 0.61  --  0.53  --   LATICACIDVEN 1.3 1.2  --   --   --   --   --   --   --   --   VANCOTROUGH  --   --   --   --   --   --   --   --  9*  --   VANCOPEAK  --   --   --   --   --   --   --  10*  --  29*    Estimated Creatinine Clearance: 52.5 mL/min (by C-G formula based on SCr of 0.53 mg/dL).    Allergies  Allergen Reactions  . Codeine Nausea Only   Antimicrobials this admission: 10/16 cefepime >> 10/19 10/16 flagyl >> 1019 10/16 vancomycin >>  Dose adjustments this admission: Attempted levels 10/21, lost IV access in am - peak level not appropriate 10/22 Trough 9, peak 29 ,Vanc 1gm q24h, AUC 498, scr 0.82 Adj to 1250mg  q24 for AUC 517, Cmax 41, Cmin 9  Microbiology results: 07/15/19 bcx : staph epi (oxacillin resistant) --> see Dr. Jeannine Kitten note on 8/27; ID suspected it was contamination and recom not to treat  10/16 BCx x1: 1/2 bottles with staph epi 10/16 UCx: > 60K mult sp-final 10/16 MRSA PCR: neg 10/17 resp panel: neg FINAL 10/17 influ pcr: neg 10/19 rpt BCx: ngtd x 3 days 10/20 Cath tip: ngtd  Thank you for allowing pharmacy to be a part of this patient's care.  Minda Ditto PharmD 09/15/2019 1:05  PM

## 2019-09-15 NOTE — Progress Notes (Signed)
Secured chat with Dr. Johnnye Sima. Ok to place midline. Fran Lowes, RN VAST

## 2019-09-15 NOTE — Progress Notes (Signed)
PHARMACY CONSULT NOTE FOR:  OUTPATIENT  PARENTERAL ANTIBIOTIC THERAPY (OPAT)  Indication: MRSE bacteremia Regimen: vancomycin 1250mg  IV 24h End date: 10/15/2019  IV antibiotic discharge orders are pended. To discharging provider:  please sign these orders via discharge navigator,  Select New Orders & click on the button choice - Manage This Unsigned Work.     Thank you for allowing pharmacy to be a part of this patient's care.  Doreene Eland, PharmD, BCPS.   Work Cell: [phone] 09/15/2019 1:28 PM

## 2019-09-15 NOTE — Progress Notes (Signed)
PROGRESS NOTE    [name]  [id number] DOB: Jul 26, 1946 DOA: 09/09/2019 PCP: Nolene Ebbs, MD   Brief Narrative: Julie Rollins is a 73 y.o. female with medical history significant of pancreatic cancer, breast cancer status post lumpectomy and radiation, hypertension, hypothyroidism presented initially with fever of unknown origin. Now treating form MRSE bacteremia d/t line sepsis, port removed 10/20. ID, Oncology and CCS seen.   Assessment & Plan:   Active Problems:   Generalized weakness   Pancreatic mass   Hypothyroidism   Port-A-Cath in place   Partial gastric outlet obstruction   Moderate protein-calorie malnutrition (HCC)   Sepsis (HCC)   Malignant neoplasm of head of pancreas (HCC)   MRSE Bacteremia withSepsis - Sepsis present on admission - presented initially with fever of unknown origin. Now treating form MRSE bacteremia d/t line sepsis, port removed 10/20. - Continue IV Vancomycin - Per ID follow up, line holiday until final BCx from 10/19 negative, then place PICC and complete Vanc 11/3 -Blood cultures x2 from 10/19: NTD after 3 days, Port-A-Cath tip culture: Negative. -I discussed with Dr. Johnnye Sima, ID.  TTE obtained and no obvious vegetations, detailed report as below.  Facial pain Patient with pain around maxillary/frontal sinuses. No sinusitis on CT maxillofacial. -Claritin, Flonase trial. Resolved -Earache has resolved as well.  Pancreatic mass Plan for Whipple procedure in November. Follows with oncology.  Hypothyroidism Normal TSH -Continue Synthroid  Partial gastric outlet obstruction -Hold TPN since treating as true bacteremia - Tolerating soft diet -When I brought up the topic of PICC line, patient indicated that she was not going to get a PICC line because she was eating well and did not need TPN.  However advised her that PICC line would be needed for IV antibiotics. -Dietitian consultation appreciated.  Continue manage  Tachycardia  Likely secondary to sepsis. Chest CTA negative for PE. Resolved.  Moderate malnutrition -Dietitian input appreciated.   DVT prophylaxis: Lovenox Code Status:   Code Status: Full Code Family Communication: None at bedside Disposition Plan: Discharge pending final blood culture results, placement of PICC line, possibly on weekend.   Consultants:   Infectious disease  Medical oncology  CCS  Procedures:   Porta Cath removal by CCS 10/20  Antimicrobials:  Vancomycin (10/16>>  Cefepime (10/16>>10/19  Flagyl (10/16>>10/19)   Subjective: Denies complaints.  No earache.  Tolerating soft diet well.  Did not eat pear because it was hard.  Objective: Vitals:   09/14/19 2153 09/15/19 0546 09/15/19 1423 09/15/19 1522  BP: 125/67 139/69  140/70  Pulse: 93 91 (!) 110 94  Resp: 17 16  18   Temp: 98.3 F (36.8 C) 97.8 F (36.6 C)  97.7 F (36.5 C)  TempSrc:  Oral  Oral  SpO2: 98% 97% 97% 99%  Weight:      Height:        Intake/Output Summary (Last 24 hours) at 09/15/2019 1710 Last data filed at 09/15/2019 1231 Gross per 24 hour  Intake 480 ml  Output -  Net 480 ml   Filed Weights   09/09/19 2029 09/10/19 0925 09/12/19 0530  Weight: 59.4 kg 63.2 kg 61.2 kg    Examination:  General exam: Pleasant elderly female, moderately built and nourished sitting up comfortably in bed without distress. Respiratory system: Clear to auscultation. Respiratory effort normal.  Left upper anterior chest Port-A-Cath removal site incision clean and dry and no acute findings. Cardiovascular system: S1 & S2 heard, RRR. No murmurs, rubs, gallops or clicks. Gastrointestinal system: Abdomen  is nondistended, soft and nontender. No organomegaly or masses felt. Normal bowel sounds heard. Central nervous system: Alert and oriented. No focal neurological deficits. Extremities: No edema. No calf tenderness Skin: No cyanosis. No rashes Psychiatry: Judgement and insight appear normal. Mood &  affect appropriate.   ENT; on 10/21 , R ear exam by Otoscopy w/o acute findings.    Data Reviewed: I have personally reviewed following labs and imaging studies  CBC: Recent Labs  Lab 09/09/19 2050 09/10/19 0428 09/11/19 0254 09/12/19 0529 09/13/19 1400 09/14/19 0544  WBC 9.1 6.9 4.2 2.4* 4.2 4.8  NEUTROABS 8.1*  --  3.6 1.4* 3.5  --   HGB 11.1* 9.8* 9.9* 9.6* 9.8* 9.8*  HCT 33.9* 31.1* 30.9* 30.5* 30.9* 31.0*  MCV 102.4* 103.3* 102.7* 104.1* 102.7* 103.0*  PLT 234 192 162 151 183 0000000   Basic Metabolic Panel: Recent Labs  Lab 09/10/19 0428 09/11/19 0254 09/12/19 0529 09/13/19 0529 09/13/19 1400 09/14/19 0544 09/15/19 0544  NA 138 138 139 140 138 137 139  K 3.6 3.8 4.4 4.8 4.2 3.9 3.7  CL 106 109 109 108 105 105 107  CO2 23 21* 23 24 25 24 23   GLUCOSE 160* 192* 149* 122* 120* 113* 101*  BUN 28* 23 28* 26* 32* 30* 24*  CREATININE 0.69 0.55 0.46 0.55 0.54 0.61 0.53  CALCIUM 8.4* 8.7* 8.8* 9.0 9.0 8.9 8.6*  MG 1.9 2.2 2.2  --   --  2.3 2.0  PHOS 2.9 2.2* 2.7  --   --  4.3 3.4   GFR: Estimated Creatinine Clearance: 52.5 mL/min (by C-G formula based on SCr of 0.53 mg/dL). Liver Function Tests: Recent Labs  Lab 09/11/19 0254 09/12/19 0529 09/13/19 1400 09/14/19 0544 09/15/19 0544  AST 46* 29 23 23 22   ALT 49* 36 29 29 26   ALKPHOS 95 85 88 85 77  BILITOT 0.4 0.1* 0.7 0.6 0.5  PROT 6.3* 5.8* 6.6 6.4* 5.8*  ALBUMIN 3.1* 2.8* 3.4* 3.2* 2.9*   Recent Labs  Lab 09/09/19 2050  LIPASE 40   No results for input(s): AMMONIA in the last 168 hours. Coagulation Profile: Recent Labs  Lab 09/09/19 2050  INR 1.0   Cardiac Enzymes: No results for input(s): CKTOTAL, CKMB, CKMBINDEX, TROPONINI in the last 168 hours. BNP (last 3 results) No results for input(s): PROBNP in the last 8760 hours. HbA1C: No results for input(s): HGBA1C in the last 72 hours. CBG: Recent Labs  Lab 09/14/19 1153 09/14/19 1614 09/15/19 0922 09/15/19 1218 09/15/19 1617  GLUCAP 139*  140* 161* 90 143*   Lipid Profile: No results for input(s): CHOL, HDL, LDLCALC, TRIG, CHOLHDL, LDLDIRECT in the last 72 hours. Thyroid Function Tests: No results for input(s): TSH, T4TOTAL, FREET4, T3FREE, THYROIDAB in the last 72 hours. Anemia Panel: No results for input(s): VITAMINB12, FOLATE, FERRITIN, TIBC, IRON, RETICCTPCT in the last 72 hours. Sepsis Labs: Recent Labs  Lab 09/09/19 2050 09/10/19 0428  PROCALCITON  --  0.21  LATICACIDVEN 1.3 1.2    Recent Results (from the past 240 hour(s))  Blood Culture (routine x 2)     Status: Abnormal   Collection Time: 09/09/19  8:28 PM   Specimen: Line, Central; Blood  Result Value Ref Range Status   Specimen Description   Final    BLOOD LEFT ANTECUBITAL Performed at [facility], [place] [address] [place], [place] [address]    Special Requests   Final    BOTTLES DRAWN AEROBIC AND ANAEROBIC Blood Culture results  may not be optimal due to an excessive volume of blood received in culture bottles Performed at [facility] [address] [place], [place] [address]    Culture  Setup Time   Final    GRAM POSITIVE COCCI IN BOTH AEROBIC AND ANAEROBIC BOTTLES CRITICAL RESULT CALLED TO, READ BACK BY AND VERIFIED WITH: PHARMD A PHAM [id number] AT 50 BY CM Performed at [facility], [place] [address] [place], [place] [address]    Culture STAPHYLOCOCCUS EPIDERMIDIS (A)  Final   Report Status 09/12/2019 FINAL  Final   Organism ID, Bacteria STAPHYLOCOCCUS EPIDERMIDIS  Final      Susceptibility   Staphylococcus epidermidis - MIC*    CIPROFLOXACIN >=8 RESISTANT Resistant     ERYTHROMYCIN >=8 RESISTANT Resistant     GENTAMICIN <=0.5 SENSITIVE Sensitive     OXACILLIN >=4 RESISTANT Resistant     TETRACYCLINE 2 SENSITIVE Sensitive     VANCOMYCIN 1 SENSITIVE Sensitive     TRIMETH/SULFA 80 RESISTANT Resistant     CLINDAMYCIN RESISTANT Resistant     RIFAMPIN <=0.5 SENSITIVE Sensitive     Inducible Clindamycin POSITIVE  Resistant     * STAPHYLOCOCCUS EPIDERMIDIS  Urine culture     Status: Abnormal   Collection Time: 09/09/19  8:39 PM   Specimen: In/Out Cath Urine  Result Value Ref Range Status   Specimen Description   Final    IN/OUT CATH URINE Performed at [facility], [place] [address] [place], [place] [address]    Special Requests   Final    NONE Performed at [facility], [place] [address] [place], [place] [address]    Culture (A)  Final    60,000 COLONIES/mL MULTIPLE SPECIES PRESENT, SUGGEST RECOLLECTION   Report Status 09/11/2019 FINAL  Final  SARS CORONAVIRUS 2 (TAT 6-24 HRS) Nasopharyngeal Nasopharyngeal Swab     Status: None   Collection Time: 09/09/19  8:53 PM   Specimen: Nasopharyngeal Swab  Result Value Ref Range Status   SARS Coronavirus 2 NEGATIVE NEGATIVE Final    Comment: (NOTE) SARS-CoV-2 target nucleic acids are NOT DETECTED. The SARS-CoV-2 RNA is generally detectable in upper and lower respiratory specimens during the acute phase of infection. Negative results do not preclude SARS-CoV-2 infection, do not rule out co-infections with other pathogens, and should not be used as the sole basis for treatment or other patient management decisions. Negative results must be combined with clinical observations, patient history, and epidemiological information. The expected result is Negative. Fact Sheet for Patients: SugarRoll.be Fact Sheet for Healthcare Providers: https://www.woods-mathews.com/ This test is not yet approved or cleared by the [place] FDA and  has been authorized for detection and/or diagnosis of SARS-CoV-2 by FDA under an Emergency Use Authorization (EUA). This EUA will remain  in effect (meaning this test can be used) for the duration of the COVID-19 declaration under Section 56 4(b)(1) of the Act, 21 U.S.C. section 360bbb-3(b)(1), unless the authorization is terminated or revoked  sooner. Performed at [facility], [place] [address], [place], [place] [address]   MRSA PCR Screening     Status: None   Collection Time: 09/09/19 11:58 PM   Specimen: Nasal Mucosa; Nasopharyngeal  Result Value Ref Range Status   MRSA by PCR NEGATIVE NEGATIVE Final    Comment:        The GeneXpert MRSA Assay (FDA approved for NASAL specimens only), is one component of a comprehensive MRSA colonization surveillance program. It is not  intended to diagnose MRSA infection nor to guide or monitor treatment for MRSA infections. Performed at [facility], [place] [address], [place], [place] [address]   Respiratory Panel by PCR     Status: None   Collection Time: 09/10/19 10:00 AM   Specimen: Nasopharyngeal Swab; Respiratory  Result Value Ref Range Status   Adenovirus NOT DETECTED NOT DETECTED Final   Coronavirus 229E NOT DETECTED NOT DETECTED Final    Comment: (NOTE) The Coronavirus on the Respiratory Panel, DOES NOT test for the novel  Coronavirus (2019 nCoV)    Coronavirus HKU1 NOT DETECTED NOT DETECTED Final   Coronavirus NL63 NOT DETECTED NOT DETECTED Final   Coronavirus OC43 NOT DETECTED NOT DETECTED Final   Metapneumovirus NOT DETECTED NOT DETECTED Final   Rhinovirus / Enterovirus NOT DETECTED NOT DETECTED Final   Influenza A NOT DETECTED NOT DETECTED Final   Influenza B NOT DETECTED NOT DETECTED Final   Parainfluenza Virus 1 NOT DETECTED NOT DETECTED Final   Parainfluenza Virus 2 NOT DETECTED NOT DETECTED Final   Parainfluenza Virus 3 NOT DETECTED NOT DETECTED Final   Parainfluenza Virus 4 NOT DETECTED NOT DETECTED Final   Respiratory Syncytial Virus NOT DETECTED NOT DETECTED Final   Bordetella pertussis NOT DETECTED NOT DETECTED Final   Chlamydophila pneumoniae NOT DETECTED NOT DETECTED Final   Mycoplasma pneumoniae NOT DETECTED NOT DETECTED Final    Comment: Performed at [facility], [address], [place], [place] [address]  Culture, blood  (routine x 2)     Status: None (Preliminary result)   Collection Time: 09/12/19 10:54 AM   Specimen: BLOOD  Result Value Ref Range Status   Specimen Description   Final    BLOOD BLOOD RIGHT ARM Performed at [place] [address] [place], [place] [address]    Special Requests   Final    BAA Blood Culture adequate volume Performed at [facility] [address], [place], [place] [address]    Culture   Final    NO GROWTH 3 DAYS Performed at [facility], [place] [address] [place], [place] [address]    Report Status PENDING  Incomplete  Culture, blood (routine x 2)     Status: None (Preliminary result)   Collection Time: 09/12/19 10:54 AM   Specimen: BLOOD  Result Value Ref Range Status   Specimen Description   Final    BLOOD BLOOD RIGHT HAND Performed at [facility] [address] [place], [place] [address]    Special Requests   Final    BAA Blood Culture adequate volume Performed at [facility] [address] [place], [place] [address]    Culture   Final    NO GROWTH 3 DAYS Performed at [facility], [place] [address], [place], [place] [address]    Report Status PENDING  Incomplete  Cath Tip Culture     Status: None   Collection Time: 09/13/19 12:20 PM   Specimen: Foreign Body Removal; Other  Result Value Ref Range Status   Specimen Description   Final    CATH TIP Performed at [facility], [place] [address], [place], [place] [address]    Special Requests   Final    NONE Performed at [facility], [place] [address], [place], [place] [address]    Culture   Final    NO GROWTH 2 DAYS Performed at [facility], [address] [place], [place] [address]    Report Status 09/15/2019 FINAL  Final         Radiology Studies: No results found.      Scheduled Meds: . Chlorhexidine Gluconate Cloth  6 each Topical Daily  . enoxaparin (LOVENOX)  injection  40 mg Subcutaneous Daily  . feeding supplement  1 Container Oral BID WC  . feeding supplement (ENSURE ENLIVE)  237 mL Oral BID BM  . feeding supplement (PRO-STAT SUGAR FREE 64)  30 mL Oral BID  . fluticasone  1 spray Each Nare Daily  . insulin aspart  0-9 Units Subcutaneous TID WC  . levothyroxine  50 mcg Oral Q0600  . loratadine  10 mg Oral Daily  . mouth rinse  15 mL Mouth Rinse BID  . pantoprazole  40 mg Oral Daily  . sodium chloride flush  10-40 mL Intracatheter Q12H   Continuous Infusions: . [START ON 09/16/2019] vancomycin       LOS: 5 days     Vernell Leep, MD, FACP, [facility]. [facility]  To contact the attending provider between 7A-7P or the covering provider during after hours 7P-7A, please log into the web site [email or web address] and access using universal [place] password for that web site. If you do not have the password, please call the hospital operator.

## 2019-09-16 LAB — GLUCOSE, CAPILLARY
Glucose-Capillary: 112 mg/dL — ABNORMAL HIGH (ref 70–99)
Glucose-Capillary: 132 mg/dL — ABNORMAL HIGH (ref 70–99)
Glucose-Capillary: 137 mg/dL — ABNORMAL HIGH (ref 70–99)

## 2019-09-16 NOTE — Progress Notes (Signed)
OT Cancellation Note  Patient Details Name: Julie Rollins MRN: [id number] DOB: 1946/07/03   Cancelled Treatment:    Reason Eval/Treat Not Completed: Other (comment) Pt declines participation in OT this date citing that she just ate and needs to rest. Will f/u on next scheduled date as able.  Jake Church Aviyanna Colbaugh 09/16/2019, 6:07 PM

## 2019-09-16 NOTE — H&P (View-Only) (Signed)
[facility] Progress Note  3 Days Post-Op  Subjective: CC-  Feeling ok today. Abdominal pain well controlled. No recent n/v. Tolerating small, frequent PO intake. States that she was able to drink Ensure x2 and Boost x1 yesterday. Calorie count in progress. BM yesterday.  Objective: Vital signs in last 24 hours: Temp:  [97.7 F (36.5 C)-98.4 F (36.9 C)] 98.1 F (36.7 C) (10/23 0432) Pulse Rate:  [94-110] 98 (10/23 0432) Resp:  [17-18] 18 (10/23 0432) BP: (135-144)/(68-77) 144/77 (10/23 0432) SpO2:  [97 %-99 %] 98 % (10/23 0432) Last BM Date: 09/15/19  Intake/Output from previous day: 10/22 0701 - 10/23 0700 In: 480 [P.O.:480] Out: -  Intake/Output this shift: No intake/output data recorded.  PE: Gen:  Alert, NAD, pleasant Pulm:  Rate and effort normal Chest: previous left port site cdi without erythema or drainage Abd: Soft, NT/ND, +BS  Lab Results:  Recent Labs    09/13/19 1400 09/14/19 0544  WBC 4.2 4.8  HGB 9.8* 9.8*  HCT 30.9* 31.0*  PLT 183 204   BMET Recent Labs    09/14/19 0544 09/15/19 0544  NA 137 139  K 3.9 3.7  CL 105 107  CO2 24 23  GLUCOSE 113* 101*  BUN 30* 24*  CREATININE 0.61 0.53  CALCIUM 8.9 8.6*   PT/INR No results for input(s): LABPROT, INR in the last 72 hours. CMP     Component Value Date/Time   NA 139 09/15/2019 0544   K 3.7 09/15/2019 0544   CL 107 09/15/2019 0544   CO2 23 09/15/2019 0544   GLUCOSE 101 (H) 09/15/2019 0544   BUN 24 (H) 09/15/2019 0544   CREATININE 0.53 09/15/2019 0544   CREATININE 0.94 08/17/2019 0753   CALCIUM 8.6 (L) 09/15/2019 0544   PROT 5.8 (L) 09/15/2019 0544   ALBUMIN 2.9 (L) 09/15/2019 0544   AST 22 09/15/2019 0544   AST 16 08/17/2019 0753   ALT 26 09/15/2019 0544   ALT 15 08/17/2019 0753   ALKPHOS 77 09/15/2019 0544   BILITOT 0.5 09/15/2019 0544   BILITOT 0.3 08/17/2019 0753   GFRNONAA >60 09/15/2019 0544   GFRNONAA 59 (L) 08/17/2019 0753   GFRAA >60 09/15/2019 0544   GFRAA >60 08/17/2019 0753   Lipase     Component Value Date/Time   LIPASE 40 09/09/2019 2050       Studies/Results: No results found.  Anti-infectives: Anti-infectives (From admission, onward)   Start     Dose/Rate Route Frequency Ordered Stop   09/16/19 0900  vancomycin (VANCOCIN) 1,250 mg in sodium chloride 0.9 % 250 mL IVPB     1,250 mg 166.7 mL/hr over 90 Minutes Intravenous Every 24 hours 09/15/19 1330     09/14/19 0600  ceFAZolin (ANCEF) IVPB 2g/100 mL premix  Status:  Discontinued     2 g 200 mL/hr over 30 Minutes Intravenous On call to O.R. 09/13/19 1243 09/13/19 1329   09/10/19 1000  vancomycin (VANCOCIN) IVPB 1000 mg/200 mL premix  Status:  Discontinued     1,000 mg 200 mL/hr over 60 Minutes Intravenous Every 24 hours 09/10/19 0425 09/15/19 1330   09/10/19 0800  ceFEPIme (MAXIPIME) 2 g in sodium chloride 0.9 % 100 mL IVPB  Status:  Discontinued     2 g 200 mL/hr over 30 Minutes Intravenous Every 12 hours 09/10/19 0426 09/12/19 1027   09/10/19 0600  metroNIDAZOLE (FLAGYL) IVPB 500 mg  Status:  Discontinued     500 mg 100 mL/hr over 60 Minutes Intravenous  Every 8 hours 09/10/19 0401 09/12/19 1030   09/09/19 2030  ceFEPIme (MAXIPIME) 2 g in sodium chloride 0.9 % 100 mL IVPB     2 g 200 mL/hr over 30 Minutes Intravenous  Once 09/09/19 2023 09/09/19 2145   09/09/19 2030  metroNIDAZOLE (FLAGYL) IVPB 500 mg     500 mg 100 mL/hr over 60 Minutes Intravenous  Once 09/09/19 2023 09/09/19 2322   09/09/19 2030  vancomycin (VANCOCIN) IVPB 1000 mg/200 mL premix     1,000 mg 200 mL/hr over 60 Minutes Intravenous  Once 09/09/19 2023 09/10/19 0040       Assessment/Plan Hypothyroid Sepsis - Blood cultures pending Pancreatic CA - Whipple procedure planned 10/08/2019 Partial gastric outlet obstruction Malnutrition - prealbumin 23.3 (10/20)  Infected port Removal of Port-A-Cath 09/13/2019, Dr. Stark Klein - cath tip culture negative  FEN: Soft diet ID: cefepime  10/17-10/19; Flagyl 10/16-10/19; Vancomycin 10/16>>  DVT:  Lovenox Follow up:  TBD  Plan:  Calorie count in progress, depending on how much patient is taking in by mouth she may or may not need TPN at home. She will have a PICC for IV antibiotics.  Surgery still scheduled for 09/25/2019 with Dr. Barry Dienes  Addendum: Discussed calorie count with dietician and Dr Barry Dienes. Ok to go home without TPN. Continue PO intake as recommended by dietician.   LOS: 6 days    [facility] 09/16/2019, 10:00 AM Please see Amion for pager number during day hours 7:00am-4:30pm

## 2019-09-16 NOTE — Progress Notes (Signed)
PROGRESS NOTE    Julie Rollins  [id number] DOB: 11-16-1946 DOA: 09/09/2019 PCP: Nolene Ebbs, MD   Brief Narrative: Julie Rollins is a 73 y.o. female with medical history significant of pancreatic cancer, breast cancer status post lumpectomy and radiation, hypertension, hypothyroidism presented initially with fever of unknown origin. Now treating form MRSE bacteremia d/t line sepsis, port removed 10/20. ID, Oncology and CCS seen.   Assessment & Plan:   Active Problems:   Generalized weakness   Pancreatic mass   Hypothyroidism   Port-A-Cath in place   Partial gastric outlet obstruction   Moderate protein-calorie malnutrition (HCC)   Sepsis (HCC)   Malignant neoplasm of head of pancreas (HCC)   MRSE Bacteremia withSepsis - Sepsis present on admission - presented initially with fever of unknown origin. Now treating form MRSE bacteremia d/t line sepsis, port removed 10/20. - Continue IV Vancomycin - Per ID follow up, line holiday until final BCx from 10/19 negative, then place PICC and complete Vanc 11/3 -Blood cultures x2 from 10/19: NTD after 4 days (final cultures pending at this time), Port-A-Cath tip culture: Negative. - TTE obtained and no obvious vegetations, detailed report as below.  Facial pain Patient with pain around maxillary/frontal sinuses. No sinusitis on CT maxillofacial. -Claritin, Flonase trial. Resolved -Earache has resolved as well.  Pancreatic mass Plan for Whipple procedure in November. Follows with oncology.  Hypothyroidism Normal TSH -Continue Synthroid  Partial gastric outlet obstruction -Hold TPN since treating as true bacteremia - Tolerating soft diet -When I brought up the topic of PICC line on 10/22, patient indicated that she was not going to get a PICC line because she was eating well and did not need TPN.  However advised her that PICC line would be needed for IV antibiotics. - Discussed in detail with Dietician> recommend not  restarting TPN as long as she is doing 6 small meals/day, drinking 2 full bottles of Ensure and taking 2 packets of prostat each day - Although she will complete IV Vancomycin after 11/3 doses, would leave PICC line until surgery 11/3 at which time Dr. Barry Dienes can decide when it can be pulled.  Tachycardia Likely secondary to sepsis. Chest CTA negative for PE. Resolved.  Moderate malnutrition -Dietitian input appreciated.  S/P IV Infiltration left forearm - elevate limb. Cold compress. Monitor.   DVT prophylaxis: Lovenox Code Status:   Code Status: Full Code Family Communication: None at bedside Disposition Plan: Discharge pending final blood culture results, placement of PICC line, possibly on 10/24   Consultants:   Infectious disease  Medical oncology  CCS  Procedures:   Porta Cath removal by CCS 10/20  Antimicrobials:  Vancomycin (10/16>>  Cefepime (10/16>>10/19  Flagyl (10/16>>10/19)   Subjective: Mildly painful bump at IV infiltration site left forearm. Denies any other complaints. Tolerating PO diet. Having BM's.  Objective: Vitals:   09/15/19 1522 09/15/19 2001 09/16/19 0432 09/16/19 1251  BP: 140/70 135/68 (!) 144/77 (!) 144/71  Pulse: 94 96 98 90  Resp: 18 17 18 16   Temp: 97.7 F (36.5 C) 98.4 F (36.9 C) 98.1 F (36.7 C) 97.7 F (36.5 C)  TempSrc: Oral   Oral  SpO2: 99% 98% 98% 98%  Weight:      Height:        Intake/Output Summary (Last 24 hours) at 09/16/2019 1541 Last data filed at 09/16/2019 1300 Gross per 24 hour  Intake 240 ml  Output -  Net 240 ml   Filed Weights   09/09/19 2029  09/10/19 0925 09/12/19 0530  Weight: 59.4 kg 63.2 kg 61.2 kg    Examination:  General exam: Pleasant elderly female, moderately built and nourished sitting up comfortably in bed without distress. Respiratory system: Clear to auscultation. Respiratory effort normal.  Left upper anterior chest Port-A-Cath removal site incision clean and dry and no acute  findings. Cardiovascular system: S1 & S2 heard, RRR. No murmurs, rubs, gallops or clicks. Gastrointestinal system: Abdomen is nondistended, soft and nontender. No organomegaly or masses felt. Normal bowel sounds heard. Central nervous system: Alert and oriented. No focal neurological deficits. Extremities: No edema. No calf tenderness. Left mid ventral forearm with a small area of induration at IV site w/o redness, drainage or increased warmth. Skin: No cyanosis. No rashes Psychiatry: Judgement and insight appear normal. Mood & affect appropriate.   ENT; on 10/21 , R ear exam by Otoscopy w/o acute findings.    Data Reviewed: I have personally reviewed following labs and imaging studies  CBC: Recent Labs  Lab 09/09/19 2050 09/10/19 0428 09/11/19 0254 09/12/19 0529 09/13/19 1400 09/14/19 0544  WBC 9.1 6.9 4.2 2.4* 4.2 4.8  NEUTROABS 8.1*  --  3.6 1.4* 3.5  --   HGB 11.1* 9.8* 9.9* 9.6* 9.8* 9.8*  HCT 33.9* 31.1* 30.9* 30.5* 30.9* 31.0*  MCV 102.4* 103.3* 102.7* 104.1* 102.7* 103.0*  PLT 234 192 162 151 183 0000000   Basic Metabolic Panel: Recent Labs  Lab 09/10/19 0428 09/11/19 0254 09/12/19 0529 09/13/19 0529 09/13/19 1400 09/14/19 0544 09/15/19 0544  NA 138 138 139 140 138 137 139  K 3.6 3.8 4.4 4.8 4.2 3.9 3.7  CL 106 109 109 108 105 105 107  CO2 23 21* 23 24 25 24 23   GLUCOSE 160* 192* 149* 122* 120* 113* 101*  BUN 28* 23 28* 26* 32* 30* 24*  CREATININE 0.69 0.55 0.46 0.55 0.54 0.61 0.53  CALCIUM 8.4* 8.7* 8.8* 9.0 9.0 8.9 8.6*  MG 1.9 2.2 2.2  --   --  2.3 2.0  PHOS 2.9 2.2* 2.7  --   --  4.3 3.4   GFR: Estimated Creatinine Clearance: 52.5 mL/min (by C-G formula based on SCr of 0.53 mg/dL). Liver Function Tests: Recent Labs  Lab 09/11/19 0254 09/12/19 0529 09/13/19 1400 09/14/19 0544 09/15/19 0544  AST 46* 29 23 23 22   ALT 49* 36 29 29 26   ALKPHOS 95 85 88 85 77  BILITOT 0.4 0.1* 0.7 0.6 0.5  PROT 6.3* 5.8* 6.6 6.4* 5.8*  ALBUMIN 3.1* 2.8* 3.4* 3.2*  2.9*   Recent Labs  Lab 09/09/19 2050  LIPASE 40   No results for input(s): AMMONIA in the last 168 hours. Coagulation Profile: Recent Labs  Lab 09/09/19 2050  INR 1.0   Cardiac Enzymes: No results for input(s): CKTOTAL, CKMB, CKMBINDEX, TROPONINI in the last 168 hours. BNP (last 3 results) No results for input(s): PROBNP in the last 8760 hours. HbA1C: No results for input(s): HGBA1C in the last 72 hours. CBG: Recent Labs  Lab 09/15/19 0922 09/15/19 1218 09/15/19 1617 09/16/19 0806 09/16/19 1145  GLUCAP 161* 90 143* 112* 132*   Lipid Profile: No results for input(s): CHOL, HDL, LDLCALC, TRIG, CHOLHDL, LDLDIRECT in the last 72 hours. Thyroid Function Tests: No results for input(s): TSH, T4TOTAL, FREET4, T3FREE, THYROIDAB in the last 72 hours. Anemia Panel: No results for input(s): VITAMINB12, FOLATE, FERRITIN, TIBC, IRON, RETICCTPCT in the last 72 hours. Sepsis Labs: Recent Labs  Lab 09/09/19 2050 09/10/19 0428  PROCALCITON  --  0.21  LATICACIDVEN 1.3 1.2    Recent Results (from the past 240 hour(s))  Blood Culture (routine x 2)     Status: Abnormal   Collection Time: 09/09/19  8:28 PM   Specimen: Line, Central; Blood  Result Value Ref Range Status   Specimen Description   Final    BLOOD LEFT ANTECUBITAL Performed at [facility], [place] [address], [place], [place] [address]    Special Requests   Final    BOTTLES DRAWN AEROBIC AND ANAEROBIC Blood Culture results may not be optimal due to an excessive volume of blood received in culture bottles Performed at [place] [address] [place], [place] [address]    Culture  Setup Time   Final    GRAM POSITIVE COCCI IN BOTH AEROBIC AND ANAEROBIC BOTTLES CRITICAL RESULT CALLED TO, READ BACK BY AND VERIFIED WITH: PHARMD A PHAM [id number] AT 63 BY CM Performed at [facility], [place] [address] [place], [place] [address]    Culture STAPHYLOCOCCUS EPIDERMIDIS (A)  Final   Report  Status 09/12/2019 FINAL  Final   Organism ID, Bacteria STAPHYLOCOCCUS EPIDERMIDIS  Final      Susceptibility   Staphylococcus epidermidis - MIC*    CIPROFLOXACIN >=8 RESISTANT Resistant     ERYTHROMYCIN >=8 RESISTANT Resistant     GENTAMICIN <=0.5 SENSITIVE Sensitive     OXACILLIN >=4 RESISTANT Resistant     TETRACYCLINE 2 SENSITIVE Sensitive     VANCOMYCIN 1 SENSITIVE Sensitive     TRIMETH/SULFA 80 RESISTANT Resistant     CLINDAMYCIN RESISTANT Resistant     RIFAMPIN <=0.5 SENSITIVE Sensitive     Inducible Clindamycin POSITIVE Resistant     * STAPHYLOCOCCUS EPIDERMIDIS  Urine culture     Status: Abnormal   Collection Time: 09/09/19  8:39 PM   Specimen: In/Out Cath Urine  Result Value Ref Range Status   Specimen Description   Final    IN/OUT CATH URINE Performed at [facility], [place] [address] [place], [place] [address]    Special Requests   Final    NONE Performed at [facility], [place] [address] [place], [place] [address]    Culture (A)  Final    60,000 COLONIES/mL MULTIPLE SPECIES PRESENT, SUGGEST RECOLLECTION   Report Status 09/11/2019 FINAL  Final  SARS CORONAVIRUS 2 (TAT 6-24 HRS) Nasopharyngeal Nasopharyngeal Swab     Status: None   Collection Time: 09/09/19  8:53 PM   Specimen: Nasopharyngeal Swab  Result Value Ref Range Status   SARS Coronavirus 2 NEGATIVE NEGATIVE Final    Comment: (NOTE) SARS-CoV-2 target nucleic acids are NOT DETECTED. The SARS-CoV-2 RNA is generally detectable in upper and lower respiratory specimens during the acute phase of infection. Negative results do not preclude SARS-CoV-2 infection, do not rule out co-infections with other pathogens, and should not be used as the sole basis for treatment or other patient management decisions. Negative results must be combined with clinical observations, patient history, and epidemiological information. The expected result is Negative. Fact Sheet for Patients:  SugarRoll.be Fact Sheet for Healthcare Providers: https://www.woods-mathews.com/ This test is not yet approved or cleared by the [place] FDA and  has been authorized for detection and/or diagnosis of SARS-CoV-2 by FDA under an Emergency Use Authorization (EUA). This EUA will remain  in effect (meaning this test can be used) for the duration of the COVID-19 declaration under Section 56 4(b)(1) of the Act, 21 U.S.C. section 360bbb-3(b)(1), unless  the authorization is terminated or revoked sooner. Performed at [facility], [place] [address] [place], [place] [address]   MRSA PCR Screening     Status: None   Collection Time: 09/09/19 11:58 PM   Specimen: Nasal Mucosa; Nasopharyngeal  Result Value Ref Range Status   MRSA by PCR NEGATIVE NEGATIVE Final    Comment:        The GeneXpert MRSA Assay (FDA approved for NASAL specimens only), is one component of a comprehensive MRSA colonization surveillance program. It is not intended to diagnose MRSA infection nor to guide or monitor treatment for MRSA infections. Performed at [facility], [place] [address], [place], [place] [address]   Respiratory Panel by PCR     Status: None   Collection Time: 09/10/19 10:00 AM   Specimen: Nasopharyngeal Swab; Respiratory  Result Value Ref Range Status   Adenovirus NOT DETECTED NOT DETECTED Final   Coronavirus 229E NOT DETECTED NOT DETECTED Final    Comment: (NOTE) The Coronavirus on the Respiratory Panel, DOES NOT test for the novel  Coronavirus (2019 nCoV)    Coronavirus HKU1 NOT DETECTED NOT DETECTED Final   Coronavirus NL63 NOT DETECTED NOT DETECTED Final   Coronavirus OC43 NOT DETECTED NOT DETECTED Final   Metapneumovirus NOT DETECTED NOT DETECTED Final   Rhinovirus / Enterovirus NOT DETECTED NOT DETECTED Final   Influenza A NOT DETECTED NOT DETECTED Final   Influenza B NOT DETECTED NOT DETECTED Final   Parainfluenza  Virus 1 NOT DETECTED NOT DETECTED Final   Parainfluenza Virus 2 NOT DETECTED NOT DETECTED Final   Parainfluenza Virus 3 NOT DETECTED NOT DETECTED Final   Parainfluenza Virus 4 NOT DETECTED NOT DETECTED Final   Respiratory Syncytial Virus NOT DETECTED NOT DETECTED Final   Bordetella pertussis NOT DETECTED NOT DETECTED Final   Chlamydophila pneumoniae NOT DETECTED NOT DETECTED Final   Mycoplasma pneumoniae NOT DETECTED NOT DETECTED Final    Comment: Performed at [facility], [address], [place], [place] [address]  Culture, blood (routine x 2)     Status: None (Preliminary result)   Collection Time: 09/12/19 10:54 AM   Specimen: BLOOD  Result Value Ref Range Status   Specimen Description   Final    BLOOD BLOOD RIGHT ARM Performed at [place] [address], [place], [place] [address]    Special Requests   Final    BAA Blood Culture adequate volume Performed at [place] [address], [place], [place] [address]    Culture   Final    NO GROWTH 4 DAYS Performed at [facility], [place] [address], [place], [place] [address]    Report Status PENDING  Incomplete  Culture, blood (routine x 2)     Status: None (Preliminary result)   Collection Time: 09/12/19 10:54 AM   Specimen: BLOOD  Result Value Ref Range Status   Specimen Description   Final    BLOOD BLOOD RIGHT HAND Performed at [place] [address] [place], [place] [address]    Special Requests   Final    BAA Blood Culture adequate volume Performed at [place] [address] [place], [place] [address]    Culture   Final    NO GROWTH 4 DAYS Performed at [facility], [place] [address], [place], [place] [address]    Report Status PENDING  Incomplete  Cath Tip Culture     Status:  None   Collection Time: 09/13/19 12:20 PM   Specimen: Foreign Body Removal; Other  Result Value Ref Range Status   Specimen  Description   Final    CATH TIP Performed at [facility], [place] [address], [place], [place] [address]    Special Requests   Final    NONE Performed at [facility], [place] [address] [place], [place] [address]    Culture   Final    NO GROWTH 2 DAYS Performed at [place] [address] [place], [place] [address]    Report Status 09/15/2019 FINAL  Final         Radiology Studies: No results found.      Scheduled Meds: . Chlorhexidine Gluconate Cloth  6 each Topical Daily  . enoxaparin (LOVENOX) injection  40 mg Subcutaneous Daily  . feeding supplement  1 Container Oral BID WC  . feeding supplement (ENSURE ENLIVE)  237 mL Oral BID BM  . feeding supplement (PRO-STAT SUGAR FREE 64)  30 mL Oral BID  . fluticasone  1 spray Each Nare Daily  . insulin aspart  0-9 Units Subcutaneous TID WC  . levothyroxine  50 mcg Oral Q0600  . loratadine  10 mg Oral Daily  . mouth rinse  15 mL Mouth Rinse BID  . pantoprazole  40 mg Oral Daily  . sodium chloride flush  10-40 mL Intracatheter Q12H  . sodium chloride flush  10-40 mL Intracatheter Q12H   Continuous Infusions: . vancomycin 1,250 mg (09/16/19 0852)     LOS: 6 days     Vernell Leep, MD, FACP, [name]. [facility]  To contact the attending provider between 7A-7P or the covering provider during after hours 7P-7A, please log into the web site [email or web address] and access using universal  password for that web site. If you do not have the password, please call the hospital operator.

## 2019-09-16 NOTE — Progress Notes (Addendum)
[facility] Progress Note  3 Days Post-Op  Subjective: CC-  Feeling ok today. Abdominal pain well controlled. No recent n/v. Tolerating small, frequent PO intake. States that she was able to drink Ensure x2 and Boost x1 yesterday. Calorie count in progress. BM yesterday.  Objective: Vital signs in last 24 hours: Temp:  [97.7 F (36.5 C)-98.4 F (36.9 C)] 98.1 F (36.7 C) (10/23 0432) Pulse Rate:  [94-110] 98 (10/23 0432) Resp:  [17-18] 18 (10/23 0432) BP: (135-144)/(68-77) 144/77 (10/23 0432) SpO2:  [97 %-99 %] 98 % (10/23 0432) Last BM Date: 09/15/19  Intake/Output from previous day: 10/22 0701 - 10/23 0700 In: 480 [P.O.:480] Out: -  Intake/Output this shift: No intake/output data recorded.  PE: Gen:  Alert, NAD, pleasant Pulm:  Rate and effort normal Chest: previous left port site cdi without erythema or drainage Abd: Soft, NT/ND, +BS  Lab Results:  Recent Labs    09/13/19 1400 09/14/19 0544  WBC 4.2 4.8  HGB 9.8* 9.8*  HCT 30.9* 31.0*  PLT 183 204   BMET Recent Labs    09/14/19 0544 09/15/19 0544  NA 137 139  K 3.9 3.7  CL 105 107  CO2 24 23  GLUCOSE 113* 101*  BUN 30* 24*  CREATININE 0.61 0.53  CALCIUM 8.9 8.6*   PT/INR No results for input(s): LABPROT, INR in the last 72 hours. CMP     Component Value Date/Time   NA 139 09/15/2019 0544   K 3.7 09/15/2019 0544   CL 107 09/15/2019 0544   CO2 23 09/15/2019 0544   GLUCOSE 101 (H) 09/15/2019 0544   BUN 24 (H) 09/15/2019 0544   CREATININE 0.53 09/15/2019 0544   CREATININE 0.94 08/17/2019 0753   CALCIUM 8.6 (L) 09/15/2019 0544   PROT 5.8 (L) 09/15/2019 0544   ALBUMIN 2.9 (L) 09/15/2019 0544   AST 22 09/15/2019 0544   AST 16 08/17/2019 0753   ALT 26 09/15/2019 0544   ALT 15 08/17/2019 0753   ALKPHOS 77 09/15/2019 0544   BILITOT 0.5 09/15/2019 0544   BILITOT 0.3 08/17/2019 0753   GFRNONAA >60 09/15/2019 0544   GFRNONAA 59 (L) 08/17/2019 0753   GFRAA >60 09/15/2019 0544   GFRAA >60 08/17/2019 0753   Lipase     Component Value Date/Time   LIPASE 40 09/09/2019 2050       Studies/Results: No results found.  Anti-infectives: Anti-infectives (From admission, onward)   Start     Dose/Rate Route Frequency Ordered Stop   09/16/19 0900  vancomycin (VANCOCIN) 1,250 mg in sodium chloride 0.9 % 250 mL IVPB     1,250 mg 166.7 mL/hr over 90 Minutes Intravenous Every 24 hours 09/15/19 1330     09/14/19 0600  ceFAZolin (ANCEF) IVPB 2g/100 mL premix  Status:  Discontinued     2 g 200 mL/hr over 30 Minutes Intravenous On call to O.R. 09/13/19 1243 09/13/19 1329   09/10/19 1000  vancomycin (VANCOCIN) IVPB 1000 mg/200 mL premix  Status:  Discontinued     1,000 mg 200 mL/hr over 60 Minutes Intravenous Every 24 hours 09/10/19 0425 09/15/19 1330   09/10/19 0800  ceFEPIme (MAXIPIME) 2 g in sodium chloride 0.9 % 100 mL IVPB  Status:  Discontinued     2 g 200 mL/hr over 30 Minutes Intravenous Every 12 hours 09/10/19 0426 09/12/19 1027   09/10/19 0600  metroNIDAZOLE (FLAGYL) IVPB 500 mg  Status:  Discontinued     500 mg 100 mL/hr over 60 Minutes Intravenous  Every 8 hours 09/10/19 0401 09/12/19 1030   09/09/19 2030  ceFEPIme (MAXIPIME) 2 g in sodium chloride 0.9 % 100 mL IVPB     2 g 200 mL/hr over 30 Minutes Intravenous  Once 09/09/19 2023 09/09/19 2145   09/09/19 2030  metroNIDAZOLE (FLAGYL) IVPB 500 mg     500 mg 100 mL/hr over 60 Minutes Intravenous  Once 09/09/19 2023 09/09/19 2322   09/09/19 2030  vancomycin (VANCOCIN) IVPB 1000 mg/200 mL premix     1,000 mg 200 mL/hr over 60 Minutes Intravenous  Once 09/09/19 2023 09/10/19 0040       Assessment/Plan Hypothyroid Sepsis - Blood cultures pending Pancreatic CA - Whipple procedure planned 10/11/2019 Partial gastric outlet obstruction Malnutrition - prealbumin 23.3 (10/20)  Infected port Removal of Port-A-Cath 09/13/2019, Dr. Stark Klein - cath tip culture negative  FEN: Soft diet ID: cefepime  10/17-10/19; Flagyl 10/16-10/19; Vancomycin 10/16>>  DVT:  Lovenox Follow up:  TBD  Plan:  Calorie count in progress, depending on how much patient is taking in by mouth she may or may not need TPN at home. She will have a PICC for IV antibiotics.  Surgery still scheduled for 09/26/2019 with Dr. Barry Dienes  Addendum: Discussed calorie count with dietician and Dr Barry Dienes. Ok to go home without TPN. Continue PO intake as recommended by dietician.   LOS: 6 days    [facility] 09/16/2019, 10:00 AM Please see Amion for pager number during day hours 7:00am-4:30pm

## 2019-09-16 NOTE — Progress Notes (Signed)
Calorie Count Note  Full follow-up completed yesterday.  Day 2 results are as follows:  Diet: Soft Supplements: Ensure BID, Magic Cup BID, Boost Breeze BID, Prostat BID.  Breakfast (10/22): 50%--165 kcal, 2.5 grams protein Lunch (10/22): 25%--120 kcal, 6 grams protein Breakfast (10/23): ~50%--200 kcal, 12 grams protein Supplements: 2 full bottles Ensure (700 kcal, 40 grams protein); sips of Boost Breeze; 2 packets prostat (200 kcal, 30 grams protein)   *dinner from 10/22 not documented and patient reports only taking bites of the meal   Total intake: 1385 kcal kcal (69% of minimum estimated needs)  90 protein (90% of minimum estimated needs)  Nutrition Dx: Increased nutrient needs related to cancer and cancer related treatments as evidenced by estimated needs.  Goal: Patient will meet greater than or equal to 90% of their needs  Intervention:  - continue to encourage PO intakes  - continue Ensure Enlive BID and prostat BID at home (can be purchased at the [facility])   Patient reports that she is able to eat and drink without discomfort or difficulty but she is unable to take in much at any one time. She states that yesterday she drank 2 full bottles of Ensure and the day prior (10/21) she was able to drink 3 full bottles.   Patient states that one limiting factor to her intakes has been the way items are prepared in the hospital (overcooked items, not enough seasoning). She also feels it will be easier at home to eat smaller portions more frequently as the meals in the hospital are not set up to facility this as easily.  Based on intakes yesterday and today, do not feel that patient needs TPN as long as she eats 6 small meals/day and consumes 2 full bottles of Ensure Enlive and 2 packets of protstat per day. Will communicate with medical team concerning this.    Estimated Nutritional Needs:  Kcal:  2000-2200 Protein:  100-115g Fluid:  2L/day    Jarome Matin, MS, RD, LDN, CNSC Inpatient Clinical Dietitian Pager # [phone] After hours/weekend pager # [phone]

## 2019-09-16 NOTE — Progress Notes (Signed)
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  She is scheduled for Whipple surgery on 10/16/2019 and plan is to continue TPN for nutrition until procedure.  She presented to the ED on 10/16 with c/o fever and chills.  TPN resumed on admission.  Significant events:  10/17: patient's home TPN bag currently running; she stated that she hung bag at 1PM on 10/16; 1 of 2 blood cx bottles with [facility] 10/18: Per Dr. Lonny Prude, plan is to consult ID re: possible bacteremia and recom. for port  Today:   Glucose (goal <150): 101-161, 2 units insulin. Sensitive scale tidac on soft diet, TPN off  Electrolytes: wnl  Renal: scr wnl  LFTs: AST/ALT down to wnl, Tbili wnl  TGs: 81 (10/18), 63 (10/19)  Prealbumin: 23.1 (10/17), 20.1 (10/18), 16.6 (10/19)  NUTRITIONAL GOALS                                                                                             RD recs per 24 hr KCal: 2000-2200  Protein: 100 - 115 gm Fluid: 2L  Ensure Enlive: 350 kcal, 20gm protein, ordered bid, charted once 10/18 Magic Cup: 290 kcal, 9gm protein  [facility] TPN 3-in-1 formulation: - 18 hr cyclic TPN (total TPN volume 1500 mL) - AA= 104 gm - dextrose= 100 gm - lipid = 45 gm - Total kcal/day = 1256 kcal - lytes in bag: calcium  gluconate 9.6 meq, magnesium sulfate 8 meq, potassium acetate 98 mew, potassium phosphate 15 mmol, sodium acetate 77 meq - trace elements and MVI  Inpatient Custom 18 hr cyclic TPN with 99991111 ml volume provides: -  105 g/day protein (  70 g/L) -  45 g/day Lipid  ( 30  G/L) --> 37% of total TPN kcal - 105  g/day Dextrose (7 %) -  1227 Kcal/day  PLAN  No need to resume TPN at this point with documented po intake Will discontine TPN consult, orders  Minda Ditto PharmD 09/16/2019,6:51 AM

## 2019-09-17 ENCOUNTER — Inpatient Hospital Stay: Payer: Self-pay

## 2019-09-17 LAB — CULTURE, BLOOD (ROUTINE X 2)
Culture: NO GROWTH
Culture: NO GROWTH
Special Requests: ADEQUATE
Special Requests: ADEQUATE

## 2019-09-17 LAB — GLUCOSE, CAPILLARY
Glucose-Capillary: 143 mg/dL — ABNORMAL HIGH (ref 70–99)
Glucose-Capillary: 111 mg/dL — ABNORMAL HIGH (ref 70–99)

## 2019-09-17 MED ORDER — PRO-STAT SUGAR FREE PO LIQD: 30.0000 mL | Freq: Two times a day (BID) | ORAL | 0 refills | Status: AC

## 2019-09-17 MED ORDER — PANTOPRAZOLE SODIUM 40 MG PO TBEC: 40.0000 mg | DELAYED_RELEASE_TABLET | Freq: Every day | ORAL | 0 refills | Status: AC

## 2019-09-17 MED ORDER — VANCOMYCIN IV (FOR PTA / DISCHARGE USE ONLY): 1250.0000 mg | INTRAVENOUS | 0 refills | Status: AC

## 2019-09-17 MED ORDER — SODIUM CHLORIDE 0.9% FLUSH: 10.0000 mL | Freq: Two times a day (BID) | INTRAVENOUS | Status: DC

## 2019-09-17 MED ORDER — SODIUM CHLORIDE 0.9% FLUSH: 10.0000 mL | INTRAVENOUS | Status: DC | PRN

## 2019-09-17 MED ORDER — ENSURE ENLIVE PO LIQD: 237.0000 mL | Freq: Two times a day (BID) | ORAL | 30 refills | Status: AC

## 2019-09-17 MED ORDER — PANTOPRAZOLE SODIUM 40 MG PO TBEC: 40.0000 mg | DELAYED_RELEASE_TABLET | Freq: Every day | ORAL | Status: DC

## 2019-09-17 MED ORDER — HEPARIN SOD (PORK) LOCK FLUSH 100 UNIT/ML IV SOLN
250.0000 [IU] | INTRAVENOUS | Status: DC | PRN
  Filled 2019-09-17: qty 2.5

## 2019-09-17 NOTE — Progress Notes (Signed)
Spoke with Lattie Haw RN re PICC order.  Plan to place PICC this pm.

## 2019-09-17 NOTE — Discharge Summary (Addendum)
**Note Julie via Obfuscation** Physician Discharge Summary  Julie Rollins [id number] DOB: 1945-12-26  PCP: Nolene Ebbs, MD  Admitted from: Home Discharged to: Home  Admit date: 09/09/2019 Discharge date: 09/17/2019  Recommendations for Outpatient Follow-up:   Follow-up Information    Stark Klein, MD. Go on 10/08/2019.   Specialty: General Surgery Why: Follow up as scheduled for surgery Contact information: [place] [address] [phone]        Nolene Ebbs, MD. Schedule an appointment as soon as possible for a visit in 1 week(s).   Specialty: Internal Medicine Why: To be seen with repeat labs that will be drawn by home health services. Contact information: [place] [address] [phone]        Truitt Merle, MD. Schedule an appointment as soon as possible for a visit.   Specialties: Hematology, Oncology Contact information: [place] [address] [place]: RN for management of IV antibiotics via PICC line.  Please do not remove PICC line after completion of antibiotics as patient is supposed to have major surgery on 09/29/2019 Equipment/Devices: New PICC line. PICC Double Lumen 09/17/19 PICC Right Brachial 37 cm 0 cm (Active)  Discharge Condition: Improved and stable CODE STATUS: Full Diet recommendation: Heart healthy diet.  Discharge Diagnoses:  Active Problems:   Generalized weakness   Pancreatic mass   Hypothyroidism   Port-A-Cath in place   Partial gastric outlet obstruction   Moderate protein-calorie malnutrition (HCC)   Sepsis (HCC)   Malignant neoplasm of head of pancreas ([place])   Brief Summary: Julie Rollins is a 73 y.o. femalewith medical history significant of pancreatic cancer,breast cancer status post lumpectomy and radiation, hypertension, hypothyroidism presented initially with fever of unknown origin. Now treating for MRSE bacteremia d/t line sepsis, port  removed 10/20. ID, Oncology and CCS seen.   Assessment & Plan:  MRSE Bacteremia withSepsis - Sepsis present on admission - presented initially with fever of unknown origin. Now treating form MRSE bacteremia d/t line sepsis, port removed 10/20. - Continue IV Vancomycin - Per ID follow up, line holiday until final BCx from 10/19 negative, then place PICC and complete Vanc 11/3 -Blood cultures x2 from 10/19: Negative, final report, Port-A-Cath tip culture: Negative. - TTE obtained and no obvious vegetations, detailed report as below. -Confirmed negative surveillance blood culture.  DC home after PICC line placement today.  Home health RN arranged for management of PICC line and home IV vancomycin per OPAT protocol. -Clinically improved and stable.  Facial pain - Patient with pain around maxillary/frontal sinuses. No sinusitis on CT maxillofacial. -Resolved. -Right earache has resolved as well.  Patient has chronic tinnitus and hearing loss.  Has been advised hearing aids.  Pancreatic mass Plan for Whipple procedure in November third. Follows with oncology. I discussed with Dr. Barry Dienes, keep PICC line after completion of IV antibiotics for major surgery.  Hypothyroidism Normal TSH -Continue Synthroid  Partial gastric outlet obstruction -Port-A-Cath was removed this admission.  TPN was discontinued. -Registered dietitian was consulted. -Patient having adequate oral calorie intake and hence can stay off of TPN preop. -Patient was advised doing 6 small meals/day, drinking 2 full bottles of Ensure and taking 2 packets of prostat each day  Tachycardia Likely secondary to sepsis. Chest CTA negative for PE. Resolved.    Essential hypertension Resume home Cardizem that was temporarily held in the hospital.  Moderate malnutrition -Dietitian input appreciated.  Management as noted above.  S/P IV Infiltration left forearm - elevate limb. Cold compress.   Improving.   Consultants:   Infectious disease  Medical oncology  CCS  Procedures:   Porta Cath removal by CCS 10/20  PICC line   Discharge Instructions  Discharge Instructions    Call MD for:  difficulty breathing, headache or visual disturbances   Complete by: As directed    Call MD for:  extreme fatigue   Complete by: As directed    Call MD for:  persistant dizziness or light-headedness   Complete by: As directed    Call MD for:  persistant nausea and vomiting   Complete by: As directed    Call MD for:  redness, tenderness, or signs of infection (pain, swelling, redness, odor or green/yellow discharge around incision site)   Complete by: As directed    Call MD for:  severe uncontrolled pain   Complete by: As directed    Call MD for:  temperature >100.4   Complete by: As directed    Diet - low sodium heart healthy   Complete by: As directed    Discharge instructions   Complete by: As directed    feeding supplement (BOOST / RESOURCE BREEZE) liquid, 1 can two times daily between meals.   Home infusion instructions [facility] May follow [place] Dosing Protocol; May administer Cathflo as needed to maintain patency of vascular access device.; Flushing of vascular access device: per [facility] Protocol: 0.9% NaCl pre/post medica...   Complete by: As directed    Instructions: May follow [place] Dosing Protocol   Instructions: May administer Cathflo as needed to maintain patency of vascular access device.   Instructions: Flushing of vascular access device: per [facility] Protocol: 0.9% NaCl pre/post medication administration and prn patency; Heparin 100 u/ml, 14ml for implanted ports and Heparin 10u/ml, 84ml for all other central venous catheters.   Instructions: May follow [facility] Anaphylaxis Protocol for First Dose Administration in the home: 0.9% NaCl at 25-50 ml/hr to maintain IV access for protocol meds. Epinephrine 0.3 ml IV/IM PRN and Benadryl 25-50 IV/IM PRN s/s of  anaphylaxis.   Instructions: [place] Infusion Coordinator (RN) to assist per patient IV care needs in the home PRN.   Increase activity slowly   Complete by: As directed        Medication List    STOP taking these medications   mirtazapine 7.5 MG tablet Commonly known as: REMERON   olaparib 100 MG tablet Commonly known as: Lynparza   ondansetron 8 MG disintegrating tablet Commonly known as: ZOFRAN-ODT     TAKE these medications   diltiazem 180 MG 24 hr capsule Commonly known as: CARDIZEM CD Take 180 mg by mouth daily.   feeding supplement (ENSURE ENLIVE) Liqd Take 237 mLs by mouth 2 (two) times daily between meals.   feeding supplement (PRO-STAT SUGAR FREE 64) Liqd Take 30 mLs by mouth 2 (two) times daily.   hydrocortisone 2.5 % rectal cream Commonly known as: ANUSOL-HC Place rectally 2 (two) times daily. What changed:   when to take this  reasons to take this   levothyroxine 50 MCG tablet Commonly known as: SYNTHROID Take 50 mcg by mouth every morning.   multivitamin with minerals Tabs tablet Take 1 tablet by mouth daily.   pantoprazole 40 MG tablet Commonly known as: Protonix Take 1 tablet (40 mg total) by mouth daily.   vancomycin  IVPB Inject 1,250 mg into the vein daily for 11  days. Indication: MRSE bacteremia Last Day of Therapy: 10/14/2019 Labs - Sunday/Monday:  CBC/D, CMP, and vancomycin trough. Labs - Thursday:  BMP and vancomycin trough      Allergies  Allergen Reactions  . Codeine Nausea Only      Procedures/Studies: Ct Angio Chest Pe W Or Wo Contrast  Result Date: 09/10/2019 CLINICAL DATA:  Fevers, chills and fatigue began at 1 p.m. after connecting TPN, history of pancreatic cancer. EXAM: CT ANGIOGRAPHY CHEST WITH CONTRAST TECHNIQUE: Multidetector CT imaging of the chest was performed using the standard protocol during bolus administration of intravenous contrast. Multiplanar CT image reconstructions and MIPs were obtained  to evaluate the vascular anatomy. CONTRAST:  146mL OMNIPAQUE IOHEXOL 350 MG/ML SOLN COMPARISON:  CT 08/15/2019 FINDINGS: Cardiovascular: Satisfactory opacification the pulmonary arteries to the segmental level however segmental evaluation is limited given respiratory motion artifact which is most pronounced in the lung bases. No central or lobar pulmonary emboli are identified. Central pulmonary arteries are normal caliber. No CT evidence of right heart strain. Borderline cardiac enlargement is similar to prior. Insert coronary no pericardial effusion. Normal caliber thoracic aorta with a 3 vessel aortic arch. No acute abnormality of the aorta. Tunnel left subclavian approach Port-A-Cath tip terminates at the superior cavoatrial junction. The Port-A-Cath is currently accessed at this time. Other major venous structures are unremarkable. Mediastinum/Nodes: No enlarged mediastinal, hilar or axillary lymph nodes. No acute abnormality of the trachea aside from posterior bowing reflecting imaging during exhalation. Small hiatal hernia. No acute abnormality of the esophagus. Lungs/Pleura: There is extensive respiratory motion in the bases which limits evaluation of the lung parenchyma. There are bandlike areas of opacity in the lung bases which may reflect a combination of atelectasis and/or scarring. Suspect mild centrilobular emphysema. No visible concerning nodules or masses. Upper Abdomen: Predominant left hepatic pneumobilia. Likely related to prior surgery. Remaining portions of the included upper abdomen are unremarkable. Musculoskeletal: Multilevel degenerative changes are present in the imaged portions of the spine. Stable superior endplate compression deformity of T12 with 30% height loss anteriorly. Exaggerated thoracic kyphosis is similar to prior. Remote healed sternal fracture is again noted. Review of the MIP images confirms the above findings. IMPRESSION: 1. Imaging quality is degraded by respiratory  motion limiting evaluation of the segmental and subsegmental pulmonary arteries and parenchymal portions of the lung bases. 2. No evidence of central or lobar pulmonary emboli. 3. Bandlike areas of opacity in the lung bases likely reflect a combination of atelectasis scarring. No other acute intrathoracic process. 4. Aortic Atherosclerosis (ICD10-I70.0). 5. Stable T12 compression deformity or remote healed sternal fracture. Electronically Signed   By: Lovena Le M.D.   On: 09/10/2019 01:52   Dg Chest Port 1 View  Result Date: 09/09/2019 CLINICAL DATA:  Fever and chills. EXAM: PORTABLE CHEST 1 VIEW COMPARISON:  July 17, 2019 FINDINGS: Stable left Port-A-Cath. No pneumothorax. The heart, hila, mediastinum, lungs, and pleura are unremarkable. IMPRESSION: No active disease. Electronically Signed   By: Dorise Bullion III M.D   On: 09/09/2019 20:58   [place] Ekg Site Rite  Result Date: 09/17/2019 If Site Rite image not attached, placement could not be confirmed due to current cardiac rhythm.  Ct Maxillofacial Wo Contrast  Result Date: 09/11/2019 CLINICAL DATA:  Fever of unknown origin. Question sinusitis. EXAM: CT MAXILLOFACIAL WITHOUT CONTRAST TECHNIQUE: Multidetector CT imaging of the maxillofacial structures was performed. Multiplanar CT image reconstructions were also generated. COMPARISON:  None. FINDINGS: Osseous: No focal lytic or blastic lesion is present. There  is no acute or healing fracture. Orbits: Bilateral lens replacements are noted. Globes and orbits are otherwise unremarkable. Sinuses: The paranasal sinuses and mastoid air cells are clear. Soft tissues: Soft tissues the face are unremarkable. Limited intracranial: Within normal limits IMPRESSION: Negative CT of the face. Electronically Signed   By: San Morelle M.D.   On: 09/11/2019 19:01      Subjective: Patient denies complaints.  Painful swelling of left forearm at site of IV infiltration is improving.  Tolerating diet.   Having BMs.  Patient states that she was trying to manage her TPN by herself at home.  Discharge Exam:  Vitals:   09/16/19 0432 09/16/19 1251 09/16/19 2020 09/17/19 0512  BP: (!) 144/77 (!) 144/71 (!) 146/78 136/68  Pulse: 98 90 100 90  Resp: 18 16 18 18   Temp: 98.1 F (36.7 C) 97.7 F (36.5 C) 98.3 F (36.8 C) 97.9 F (36.6 C)  TempSrc:  Oral    SpO2: 98% 98% 98% 98%  Weight:      Height:        General exam: Pleasant elderly female, moderately built and nourished sitting up comfortably in bed without distress. Respiratory system: Clear to auscultation. Respiratory effort normal.  Left upper anterior chest Port-A-Cath removal site incision clean and dry and no acute findings. Cardiovascular system: S1 & S2 heard, RRR. No murmurs, rubs, gallops or clicks. Gastrointestinal system: Abdomen is nondistended, soft and nontender. No organomegaly or masses felt. Normal bowel sounds heard. Central nervous system: Alert and oriented. No focal neurological deficits. Extremities: No edema. No calf tenderness. Left mid ventral forearm with a small area of induration at IV site w/o redness, drainage or increased warmth.  This has decreased compared to yesterday. Skin: No cyanosis. No rashes Psychiatry: Judgement and insight appear normal. Mood & affect appropriate.   ENT; on 10/21 , R ear exam by Otoscopy w/o acute findings.    The results of significant diagnostics from this hospitalization (including imaging, microbiology, ancillary and laboratory) are listed below for reference.     Microbiology: Recent Results (from the past 240 hour(s))  Blood Culture (routine x 2)     Status: Abnormal   Collection Time: 09/09/19  8:28 PM   Specimen: Line, Central; Blood  Result Value Ref Range Status   Specimen Description   Final    BLOOD LEFT ANTECUBITAL Performed at [facility], [address], [place], [place] [address]    Special Requests   Final    BOTTLES DRAWN AEROBIC AND  ANAEROBIC Blood Culture results may not be optimal due to an excessive volume of blood received in culture bottles Performed at [place] [address] [place], [place] [address]    Culture  Setup Time   Final    GRAM POSITIVE COCCI IN BOTH AEROBIC AND ANAEROBIC BOTTLES CRITICAL RESULT CALLED TO, READ BACK BY AND VERIFIED WITH: PHARMD A PHAM [id number] AT 40 BY CM Performed at [facility], [place] [address], [place], [place] [address]    Culture STAPHYLOCOCCUS EPIDERMIDIS (A)  Final   Report Status 09/12/2019 FINAL  Final   Organism ID, Bacteria STAPHYLOCOCCUS EPIDERMIDIS  Final      Susceptibility   Staphylococcus epidermidis - MIC*    CIPROFLOXACIN >=8 RESISTANT Resistant     ERYTHROMYCIN >=8 RESISTANT Resistant     GENTAMICIN <=0.5 SENSITIVE Sensitive     OXACILLIN >=4 RESISTANT Resistant     TETRACYCLINE 2 SENSITIVE Sensitive  VANCOMYCIN 1 SENSITIVE Sensitive     TRIMETH/SULFA 80 RESISTANT Resistant     CLINDAMYCIN RESISTANT Resistant     RIFAMPIN <=0.5 SENSITIVE Sensitive     Inducible Clindamycin POSITIVE Resistant     * STAPHYLOCOCCUS EPIDERMIDIS  Urine culture     Status: Abnormal   Collection Time: 09/09/19  8:39 PM   Specimen: In/Out Cath Urine  Result Value Ref Range Status   Specimen Description   Final    IN/OUT CATH URINE Performed at [facility], [place] [address], [place], [place] [address]    Special Requests   Final    NONE Performed at [facility], [place] [address] [place], [place] [address]    Culture (A)  Final    60,000 COLONIES/mL MULTIPLE SPECIES PRESENT, SUGGEST RECOLLECTION   Report Status 09/11/2019 FINAL  Final  SARS CORONAVIRUS 2 (TAT 6-24 HRS) Nasopharyngeal Nasopharyngeal Swab     Status: None   Collection Time: 09/09/19  8:53 PM   Specimen: Nasopharyngeal Swab  Result Value Ref Range Status   SARS Coronavirus 2 NEGATIVE NEGATIVE Final    Comment: (NOTE) SARS-CoV-2 target  nucleic acids are NOT DETECTED. The SARS-CoV-2 RNA is generally detectable in upper and lower respiratory specimens during the acute phase of infection. Negative results do not preclude SARS-CoV-2 infection, do not rule out co-infections with other pathogens, and should not be used as the sole basis for treatment or other patient management decisions. Negative results must be combined with clinical observations, patient history, and epidemiological information. The expected result is Negative. Fact Sheet for Patients: SugarRoll.be Fact Sheet for Healthcare Providers: https://www.woods-mathews.com/ This test is not yet approved or cleared by the [place] FDA and  has been authorized for detection and/or diagnosis of SARS-CoV-2 by FDA under an Emergency Use Authorization (EUA). This EUA will remain  in effect (meaning this test can be used) for the duration of the COVID-19 declaration under Section 56 4(b)(1) of the Act, 21 U.S.C. section 360bbb-3(b)(1), unless the authorization is terminated or revoked sooner. Performed at [facility], [place] [address] [place], [place] [address]   MRSA PCR Screening     Status: None   Collection Time: 09/09/19 11:58 PM   Specimen: Nasal Mucosa; Nasopharyngeal  Result Value Ref Range Status   MRSA by PCR NEGATIVE NEGATIVE Final    Comment:        The GeneXpert MRSA Assay (FDA approved for NASAL specimens only), is one component of a comprehensive MRSA colonization surveillance program. It is not intended to diagnose MRSA infection nor to guide or monitor treatment for MRSA infections. Performed at [facility], [place] [address], [place], [place] [address]   Respiratory Panel by PCR     Status: None   Collection Time: 09/10/19 10:00 AM   Specimen: Nasopharyngeal Swab; Respiratory  Result Value Ref Range Status   Adenovirus NOT DETECTED NOT DETECTED Final   Coronavirus 229E  NOT DETECTED NOT DETECTED Final    Comment: (NOTE) The Coronavirus on the Respiratory Panel, DOES NOT test for the novel  Coronavirus (2019 nCoV)    Coronavirus HKU1 NOT DETECTED NOT DETECTED Final   Coronavirus NL63 NOT DETECTED NOT DETECTED Final   Coronavirus OC43 NOT DETECTED NOT DETECTED Final   Metapneumovirus NOT DETECTED NOT DETECTED Final   Rhinovirus / Enterovirus NOT DETECTED NOT DETECTED Final   Influenza A NOT DETECTED NOT DETECTED Final   Influenza B NOT DETECTED NOT DETECTED Final  Parainfluenza Virus 1 NOT DETECTED NOT DETECTED Final   Parainfluenza Virus 2 NOT DETECTED NOT DETECTED Final   Parainfluenza Virus 3 NOT DETECTED NOT DETECTED Final   Parainfluenza Virus 4 NOT DETECTED NOT DETECTED Final   Respiratory Syncytial Virus NOT DETECTED NOT DETECTED Final   Bordetella pertussis NOT DETECTED NOT DETECTED Final   Chlamydophila pneumoniae NOT DETECTED NOT DETECTED Final   Mycoplasma pneumoniae NOT DETECTED NOT DETECTED Final    Comment: Performed at [facility], [place] [address], [place], [place] [address]  Culture, blood (routine x 2)     Status: None   Collection Time: 09/12/19 10:54 AM   Specimen: BLOOD  Result Value Ref Range Status   Specimen Description   Final    BLOOD BLOOD RIGHT ARM Performed at [place] [address], [place], [place] [address]    Special Requests   Final    BAA Blood Culture adequate volume Performed at [place] [address], [place], [place] [address]    Culture   Final    NO GROWTH 5 DAYS Performed at [facility], [place] [address], [place], [place] [address]    Report Status 09/17/2019 FINAL  Final  Culture, blood (routine x 2)     Status: None   Collection Time: 09/12/19 10:54 AM   Specimen: BLOOD  Result Value Ref Range Status   Specimen Description   Final    BLOOD BLOOD RIGHT HAND Performed at [place] [facility] [address], [place], [place]  [address]    Special Requests   Final    BAA Blood Culture adequate volume Performed at [place] [address] [place], [place] [address]    Culture   Final    NO GROWTH 5 DAYS Performed at [facility], [place] [address] [place], [place] [address]    Report Status 09/17/2019 FINAL  Final  Cath Tip Culture     Status: None   Collection Time: 09/13/19 12:20 PM   Specimen: Foreign Body Removal; Other  Result Value Ref Range Status   Specimen Description   Final    CATH TIP Performed at [facility], [place] [address], [place], [place] [address]    Special Requests   Final    NONE Performed at [facility], [place] [address], [place],  [address]    Culture   Final    NO GROWTH 2 DAYS Performed at [place] [address], [place],  [address]    Report Status 09/15/2019 FINAL  Final     Labs: CBC: Recent Labs  Lab 09/11/19 0254 09/12/19 0529 09/13/19 1400 09/14/19 0544  WBC 4.2 2.4* 4.2 4.8  NEUTROABS 3.6 1.4* 3.5  --   HGB 9.9* 9.6* 9.8* 9.8*  HCT 30.9* 30.5* 30.9* 31.0*  MCV 102.7* 104.1* 102.7* 103.0*  PLT 162 151 183 0000000   Basic Metabolic Panel: Recent Labs  Lab 09/11/19 0254 09/12/19 0529 09/13/19 0529 09/13/19 1400 09/14/19 0544 09/15/19 0544  NA 138 139 140 138 137 139  K 3.8 4.4 4.8 4.2 3.9 3.7  CL 109 109 108 105 105 107  CO2 21* 23 24 25 24 23   GLUCOSE 192* 149* 122* 120* 113* 101*  BUN 23 28* 26* 32* 30* 24*  CREATININE 0.55 0.46 0.55 0.54 0.61 0.53  CALCIUM 8.7* 8.8* 9.0 9.0 8.9 8.6*  MG 2.2 2.2  --   --  2.3 2.0  PHOS 2.2* 2.7  --   --  4.3 3.4   Liver Function Tests: Recent Labs  Lab 09/11/19 0254 09/12/19 0529 09/13/19 1400 09/14/19 0544 09/15/19 0544  AST 46* 29 23 23 22   ALT 49* 36 29 29 26   ALKPHOS 95 85 88 85 77  BILITOT 0.4 0.1* 0.7 0.6 0.5  PROT 6.3* 5.8* 6.6 6.4* 5.8*  ALBUMIN 3.1* 2.8* 3.4* 3.2* 2.9*   CBG: Recent Labs  Lab 09/16/19 0806  09/16/19 1145 09/16/19 1618 09/17/19 0836 09/17/19 1124  GLUCAP 112* 132* 137* 143* 111*    Urinalysis    Component Value Date/Time   COLORURINE YELLOW 09/09/2019 2039   APPEARANCEUR CLOUDY (A) 09/09/2019 2039   LABSPEC 1.016 09/09/2019 2039   PHURINE 8.0 09/09/2019 2039   [place] 09/09/2019 2039   [place] 09/09/2019 2039   [place] 09/09/2019 2039   [place] 09/09/2019 2039   PROTEINUR NEGATIVE 09/09/2019 2039   UROBILINOGEN 0.2 06/16/2014 0529   NITRITE NEGATIVE 09/09/2019 2039   LEUKOCYTESUR SMALL (A) 09/09/2019 2039   Patient states that she does not have any family to update her care.  Reportedly has a friend who can assist her.   Time coordinating discharge: 40 minutes  SIGNED:  Vernell Leep, MD, FACP, [facility]. [facility]  To contact the attending provider between 7A-7P or the covering provider during after hours 7P-7A, please log into the web site [email or web address] and access using universal [place] password for that web site. If you do not have the password, please call the hospital operator.

## 2019-09-17 NOTE — Progress Notes (Signed)
Peripherally Inserted Central Catheter/Midline Placement  The IV Nurse has discussed with the patient and/or persons authorized to consent for the patient, the purpose of this procedure and the potential benefits and risks involved with this procedure.  The benefits include less needle sticks, lab draws from the catheter, and the patient may be discharged home with the catheter. Risks include, but not limited to, infection, bleeding, blood clot (thrombus formation), and puncture of an artery; nerve damage and irregular heartbeat and possibility to perform a PICC exchange if needed/ordered by physician.  Alternatives to this procedure were also discussed.  Bard Power PICC patient education guide, fact sheet on infection prevention and patient information card has been provided to patient /or left at bedside.    PICC/Midline Placement Documentation  PICC Double Lumen 09/17/19 PICC Right Brachial 37 cm 0 cm (Active)  Indication for Insertion or Continuance of Line Home intravenous therapies (PICC only) 09/17/19 1528  Exposed Catheter (cm) 0 cm 09/17/19 1528  Site Assessment Clean;Dry;Intact 09/17/19 1528  Lumen #1 Status Flushed;Saline locked;Blood return noted 09/17/19 1528  Lumen #2 Status Flushed;Saline locked;Blood return noted 09/17/19 1528  Dressing Type Transparent 09/17/19 1528  Dressing Status Clean;Dry;Intact;Antimicrobial disc in place 09/17/19 1528  Dressing Change Due 09/24/19 09/17/19 1528       Gordan Payment 09/17/2019, 3:29 PM

## 2019-09-17 NOTE — Progress Notes (Signed)
Nsg Discharge Note  Admit Date:  09/09/2019 Discharge date: 09/17/2019   SHAKENYA CLAUSING to be D/C'd Home per MD order.  AVS completed.  Copy for chart, and copy for patient signed, and dated. Patient/caregiver able to verbalize understanding.  Discharge Medication: Allergies as of 09/17/2019      Reactions   Codeine Nausea Only      Medication List    STOP taking these medications   mirtazapine 7.5 MG tablet Commonly known as: REMERON   olaparib 100 MG tablet Commonly known as: Lynparza   ondansetron 8 MG disintegrating tablet Commonly known as: ZOFRAN-ODT     TAKE these medications   diltiazem 180 MG 24 hr capsule Commonly known as: CARDIZEM CD Take 180 mg by mouth daily.   feeding supplement (ENSURE ENLIVE) Liqd Take 237 mLs by mouth 2 (two) times daily between meals.   feeding supplement (PRO-STAT SUGAR FREE 64) Liqd Take 30 mLs by mouth 2 (two) times daily.   hydrocortisone 2.5 % rectal cream Commonly known as: ANUSOL-HC Place rectally 2 (two) times daily. What changed:   when to take this  reasons to take this   levothyroxine 50 MCG tablet Commonly known as: SYNTHROID Take 50 mcg by mouth every morning.   multivitamin with minerals Tabs tablet Take 1 tablet by mouth daily.   pantoprazole 40 MG tablet Commonly known as: Protonix Take 1 tablet (40 mg total) by mouth daily.   vancomycin  IVPB Inject 1,250 mg into the vein daily for 11 days. Indication: MRSE bacteremia Last Day of Therapy: 10/17/2019 Labs - Sunday/Monday:  CBC/D, CMP, and vancomycin trough. Labs - Thursday:  BMP and vancomycin trough            Home Infusion Instuctions  (From admission, onward)         Start     Ordered   09/17/19 0000  Home infusion instructions [facility] May follow [facility] Dosing Protocol; May administer Cathflo as needed to maintain patency of vascular access device.; Flushing of vascular access device: per [facility] Protocol: 0.9% NaCl pre/post  medica...    Question Answer Comment  Instructions May follow [facility] Dosing Protocol   Instructions May administer Cathflo as needed to maintain patency of vascular access device.   Instructions Flushing of vascular access device: per [facility] Protocol: 0.9% NaCl pre/post medication administration and prn patency; Heparin 100 u/ml, 5ml for implanted ports and Heparin 10u/ml, 5ml for all other central venous catheters.   Instructions May follow [facility] Anaphylaxis Protocol for First Dose Administration in the home: 0.9% NaCl at 25-50 ml/hr to maintain IV access for protocol meds. Epinephrine 0.3 ml IV/IM PRN and Benadryl 25-50 IV/IM PRN s/s of anaphylaxis.   Instructions [facility] Infusion Coordinator (RN) to assist per patient IV care needs in the home PRN.      10 /24/20 1347          Discharge Assessment: Vitals:   09/17/19 0512 09/17/19 1412  BP: 136/68 (!) 150/80  Pulse: 90 93  Resp: 18 16  Temp: 97.9 F (36.6 C) 98.3 F (36.8 C)  SpO2: 98% 100%   Skin clean, dry and intact without evidence of skin break down, no evidence of skin tears noted. Double lumen PICC in place. Site without signs and symptoms of complications - no redness or edema noted at insertion site, patient denies c/o pain - only slight tenderness at site.    D/c Instructions-Education: Discharge instructions given to patient/family with verbalized understanding.  D/c education completed with patient/family including follow up instructions, medication list, d/c activities limitations if indicated, with other d/c instructions as indicated by MD - patient able to verbalize understanding, all questions fully answered. Patient instructed to return to ED, call 911, or call MD for any changes in condition.  Patient escorted via [place], and D/C home via private auto.  Lindell Noe, LPN [id number] @ [id number]

## 2019-09-17 NOTE — Discharge Instructions (Signed)

## 2019-09-20 ENCOUNTER — Other Ambulatory Visit: Payer: Self-pay | Admitting: General Surgery

## 2019-09-21 NOTE — Progress Notes (Addendum)
[place] ([address]), [place] [id number] [place] ([place]) [place] [address] Phone: [phone] Fax: [phone]      Your procedure is scheduled on Tuesday 10/18/2019.  Report to [facility] Main Entrance "A" at 05:30 A.M., and check in at the Admitting office.  Call this number if you have problems the morning of surgery:  [phone]  Call [phone] if you have any questions prior to your surgery date Monday-Friday 8am-4pm    Remember:  Do not eat after midnight the night before your surgery  You may drink clear liquids until 04:30 the morning of your surgery.   Clear liquids allowed are: Water, Non-Citrus Juices (without pulp), Carbonated Beverages, Clear Tea, Black Coffee Only, and Gatorade  Follow the One Day Bowel Prep Instructions that were provided to you by your surgeon's office.    Take these medicines the morning of surgery with A SIP OF WATER: Diltiazem (Cardizem CD) Levothyroxine (Synthroid) Pantoprazole (Protonix) Vancomycin as ordered  7 days prior to surgery STOP taking any Aspirin (unless otherwise instructed by your surgeon), Aleve, Naproxen, Ibuprofen, Motrin, Advil, Goody's, BC's, all herbal medications, fish oil, and all vitamins.    The Morning of Surgery  Do not wear jewelry, make-up or nail polish.  Do not wear lotions, powders, perfumes, or deodorant  Do not shave 48 hours prior to surgery.  Men may shave face and neck.  Do not bring valuables to the hospital.  [facility] is not responsible for any belongings or valuables.  If you are a smoker, DO NOT Smoke 24 hours prior to surgery  If you wear a CPAP at night please bring your mask, tubing, and machine the morning of surgery   Remember that you must have someone to transport you home after your surgery, and remain with you for 24 hours if you are discharged the same day.   Contacts, eyeglasses, hearing aids, dentures or bridgework may not be worn  into surgery.    Leave your suitcase in the car.  After surgery it may be brought to your room.  For patients admitted to the hospital, discharge time will be determined by your treatment team.  Patients discharged the day of surgery will not be allowed to drive home.    Special instructions:   [place]- Preparing For Surgery  Before surgery, you can play an important role. Because skin is not sterile, your skin needs to be as free of germs as possible. You can reduce the number of germs on your skin by washing with CHG (chlorahexidine gluconate) Soap before surgery.  CHG is an antiseptic cleaner which kills germs and bonds with the skin to continue killing germs even after washing.    Oral Hygiene is also important to reduce your risk of infection.  Remember - BRUSH YOUR TEETH THE MORNING OF SURGERY WITH YOUR REGULAR TOOTHPASTE  Please do not use if you have an allergy to CHG or antibacterial soaps. If your skin becomes reddened/irritated stop using the CHG.  Do not shave (including legs and underarms) for at least 48 hours prior to first CHG shower. It is OK to shave your face.  Please follow these instructions carefully.   1. Shower the NIGHT BEFORE SURGERY and the MORNING OF SURGERY with CHG Soap.   2. If you chose to wash your hair, wash your hair first as usual with your normal shampoo.  3. After you shampoo, rinse your hair and body thoroughly to  remove the shampoo.  4. Use CHG as you would any other liquid soap. You can apply CHG directly to the skin and wash gently with a scrungie or a clean washcloth.   5. Apply the CHG Soap to your body ONLY FROM THE NECK DOWN.  Do not use on open wounds or open sores. Avoid contact with your eyes, ears, mouth and genitals (private parts). Wash Face and genitals (private parts)  with your normal soap.   6. Wash thoroughly, paying special attention to the area where your surgery will be performed.  7. Thoroughly rinse your body with  warm water from the neck down.  8. DO NOT shower/wash with your normal soap after using and rinsing off the CHG Soap.  9. Pat yourself dry with a CLEAN TOWEL.  10. Wear CLEAN PAJAMAS to bed the night before surgery, wear comfortable clothes the morning of surgery  11. Place CLEAN SHEETS on your bed the night of your first shower and DO NOT SLEEP WITH PETS.    Day of Surgery:  Please shower the morning of surgery with the CHG soap Do not apply any deodorants/lotions.  Please wear clean clothes to the hospital/surgery center.   Remember to brush your teeth WITH YOUR REGULAR TOOTHPASTE.   Please read over the following fact sheets that you were given.

## 2019-09-22 ENCOUNTER — Telehealth: Payer: Self-pay | Admitting: Hematology

## 2019-09-22 ENCOUNTER — Other Ambulatory Visit: Payer: Self-pay

## 2019-09-22 ENCOUNTER — Encounter (HOSPITAL_COMMUNITY): Payer: Self-pay

## 2019-09-22 ENCOUNTER — Encounter (HOSPITAL_COMMUNITY)
Admission: RE | Admit: 2019-09-22 | Discharge: 2019-09-22 | Disposition: A | Discharge status: Home / Self Care | Payer: Medicare HMO | Source: Ambulatory Visit | Attending: General Surgery | Admitting: General Surgery

## 2019-09-22 DIAGNOSIS — Z01812 Encounter for preprocedural laboratory examination: Secondary | ICD-10-CM | POA: Insufficient documentation

## 2019-09-22 LAB — COMPREHENSIVE METABOLIC PANEL
ALT: 21 U/L (ref 0–44)
AST: 21 U/L (ref 15–41)
Albumin: 3.6 g/dL (ref 3.5–5.0)
Alkaline Phosphatase: 101 U/L (ref 38–126)
Anion gap: 11 (ref 5–15)
BUN: 14 mg/dL (ref 8–23)
CO2: 23 mmol/L (ref 22–32)
Calcium: 9.4 mg/dL (ref 8.9–10.3)
Chloride: 105 mmol/L (ref 98–111)
Creatinine, Ser: 0.71 mg/dL (ref 0.44–1.00)
GFR calc Af Amer: >60 mL/min (ref >60)
GFR calc non Af Amer: >60 mL/min (ref >60)
Glucose, Bld: 181 mg/dL — ABNORMAL HIGH (ref 70–99)
Potassium: 3.9 mmol/L (ref 3.5–5.1)
Sodium: 139 mmol/L (ref 135–145)
Total Bilirubin: 0.3 mg/dL (ref 0.3–1.2)
Total Protein: 7 g/dL (ref 6.5–8.1)

## 2019-09-22 LAB — URINALYSIS, ROUTINE W REFLEX MICROSCOPIC
Bilirubin Urine: NEGATIVE
Glucose, UA: NEGATIVE mg/dL
Hgb urine dipstick: NEGATIVE
Ketones, ur: NEGATIVE mg/dL
Leukocytes,Ua: NEGATIVE
Nitrite: NEGATIVE
Protein, ur: NEGATIVE mg/dL
Specific Gravity, Urine: >1.03 — ABNORMAL HIGH (ref 1.005–1.030)
pH: 5.5 (ref 5.0–8.0)

## 2019-09-22 LAB — CBC WITH DIFFERENTIAL/PLATELET
Abs Immature Granulocytes: 0.01 10*3/uL (ref 0.00–0.07)
Basophils Absolute: 0 10*3/uL (ref 0.0–0.1)
Basophils Relative: 1 %
Eosinophils Absolute: 0.1 10*3/uL (ref 0.0–0.5)
Eosinophils Relative: 2 %
HCT: 35.7 % — ABNORMAL LOW (ref 36.0–46.0)
Hemoglobin: 11.3 g/dL — ABNORMAL LOW (ref 12.0–15.0)
Immature Granulocytes: 0 %
Lymphocytes Relative: 12 %
Lymphs Abs: 0.6 10*3/uL — ABNORMAL LOW (ref 0.7–4.0)
MCH: 32.7 pg (ref 26.0–34.0)
MCHC: 31.7 g/dL (ref 30.0–36.0)
MCV: 103.2 fL — ABNORMAL HIGH (ref 80.0–100.0)
Monocytes Absolute: 0.4 10*3/uL (ref 0.1–1.0)
Monocytes Relative: 8 %
Neutro Abs: 4 10*3/uL (ref 1.7–7.7)
Neutrophils Relative %: 77 %
Platelets: 374 10*3/uL (ref 150–400)
RBC: 3.46 MIL/uL — ABNORMAL LOW (ref 3.87–5.11)
RDW: 16.8 % — ABNORMAL HIGH (ref 11.5–15.5)
WBC: 5.2 10*3/uL (ref 4.0–10.5)
nRBC: 0 % (ref 0.0–0.2)

## 2019-09-22 LAB — PROTIME-INR
INR: 1.1 (ref 0.8–1.2)
Prothrombin Time: 13.6 seconds (ref 11.4–15.2)

## 2019-09-22 LAB — ABO/RH: ABO/RH(D): A POS

## 2019-09-22 LAB — LIPASE, BLOOD: Lipase: 37 U/L (ref 11–51)

## 2019-09-22 LAB — HEMOGLOBIN A1C
Hgb A1c MFr Bld: 5.7 % — ABNORMAL HIGH (ref 4.8–5.6)
Mean Plasma Glucose: 116.89 mg/dL

## 2019-09-22 LAB — GLUCOSE, CAPILLARY: Glucose-Capillary: 166 mg/dL — ABNORMAL HIGH (ref 70–99)

## 2019-09-22 LAB — PREPARE RBC (CROSSMATCH)

## 2019-09-22 NOTE — Telephone Encounter (Signed)
Called pt per 10/29 sch message - scheduled appt and cancelled - pt does not think she will be out of hospital by next week due to procedure . Message sent to Dr. [name] so she is aware.

## 2019-09-22 NOTE — Progress Notes (Signed)
PCP:  Nolene Ebbs, MD Cardiologist:  Patient states she believes she saw a cardiologist 3-4 years ago.  Unsure why and does not know the MD.  History states she has an arrythmia.  EKG:  09/12/19 CXR:  09/09/19 ECHO:  09/15/19 Stress Test:  denies Cardiac Cath:  denies   Patient states she is not diabetic or pre-diabetic, but CBG was 166 at PAT visit.  Covid test scheduled 09/23/19  Patient denies shortness of breath, fever, cough, and chest pain at PAT appointment.  Patient verbalized understanding of instructions provided today at the PAT appointment.  Patient asked to review instructions at home and day of surgery.

## 2019-09-23 ENCOUNTER — Other Ambulatory Visit (HOSPITAL_COMMUNITY)
Admission: RE | Admit: 2019-09-23 | Discharge: 2019-09-23 | Disposition: A | Discharge status: Home / Self Care | Payer: Medicare HMO | Source: Ambulatory Visit | Attending: General Surgery | Admitting: General Surgery

## 2019-09-23 ENCOUNTER — Telehealth: Payer: Self-pay | Admitting: Hematology

## 2019-09-23 DIAGNOSIS — Z20828 Contact with and (suspected) exposure to other viral communicable diseases: Secondary | ICD-10-CM | POA: Insufficient documentation

## 2019-09-23 DIAGNOSIS — Z01812 Encounter for preprocedural laboratory examination: Secondary | ICD-10-CM | POA: Diagnosis present

## 2019-09-23 NOTE — Progress Notes (Signed)
Anesthesia Chart Review: History significant of pancreatic cancer, breast cancer status post lumpectomy and radiation, hypertension, hypothyroidism. Recently admitted 10/16-10/24 with sepsis and fever of unknown origin. Treated for MRSE bacteremia d/t line sepsis, port removed 10/20. ID, Oncology and CCS seen. Sepsis present on admission. TTE obtained and no obvious vegetations, detailed report as below. PICC placed 09/17/19.  Preop labs reviewed, unremarkable.  TTE 09/15/19:  1. Left ventricular ejection fraction, by visual estimation, is 60 to 65%. The left ventricle has normal function. There is no left ventricular hypertrophy.  2. Left ventricular diastolic Doppler parameters are consistent with impaired relaxation pattern of LV diastolic filling.  3. Global right ventricle has normal systolic function.The right ventricular size is normal. No increase in right ventricular wall thickness.  4. Left atrial size was normal.  5. Right atrial size was normal.  6. The mitral valve is abnormal. Mild mitral valve regurgitation.  7. The tricuspid valve is grossly normal. Tricuspid valve regurgitation is mild.  8. The aortic valve is tricuspid Aortic valve regurgitation was not visualized by color flow Doppler. Mild aortic valve sclerosis without stenosis.  9. The pulmonic valve was grossly normal. Pulmonic valve regurgitation is not visualized by color flow Doppler. 10. Normal pulmonary artery systolic pressure. 11. The inferior vena cava is normal in size with greater than 50% respiratory variability, suggesting right atrial pressure of 3 mmHg. 12. No evidence of any gross valvular vegetations.  Wynonia Musty [facility] Short Stay Center/Anesthesiology Phone [phone] 09/23/2019 11:11 AM

## 2019-09-23 NOTE — Telephone Encounter (Signed)
Scheduled appt per 10/29 sch message - pt has surgery next week per YF okay to push appt out 4-6 wks - scheduled appt and pt is aware. Reminder letter mailed.

## 2019-09-23 NOTE — Anesthesia Preprocedure Evaluation (Addendum)
Anesthesia Evaluation  Patient identified by MRN, date of birth, ID band Patient awake    Reviewed: Allergy & Precautions, NPO status , Patient's Chart, lab work & pertinent test results  Airway Mallampati: II  TM Distance: >3 FB Neck ROM: Full    Dental  (+) Poor Dentition, Dental Advisory Given, Partial Lower,    Pulmonary neg pulmonary ROS,    Pulmonary exam normal breath sounds clear to auscultation       Cardiovascular hypertension, Pt. on medications Normal cardiovascular exam+ dysrhythmias Atrial Fibrillation  Rhythm:Regular Rate:Normal  PAF rate controlled with diltiazem  TTE 09/15/19:  1. Left ventricular ejection fraction, by visual estimation, is 60 to 65%. The left ventricle has normal function. There is no left ventricular hypertrophy.  2. Left ventricular diastolic Doppler parameters are consistent with impaired relaxation pattern of LV diastolic filling.  3. Global right ventricle has normal systolic function.The right ventricular size is normal. No increase in right ventricular wall thickness.  4. Left atrial size was normal.  5. Right atrial size was normal.  6. The mitral valve is abnormal. Mild mitral valve regurgitation.  7. The tricuspid valve is grossly normal. Tricuspid valve regurgitation is mild.  8. The aortic valve is tricuspid Aortic valve regurgitation was not visualized by color flow Doppler. Mild aortic valve sclerosis without stenosis.  9. The pulmonic valve was grossly normal. Pulmonic valve regurgitation is not visualized by color flow Doppler. 10. Normal pulmonary artery systolic pressure. 11. The inferior vena cava is normal in size with greater than 50% respiratory variability, suggesting right atrial pressure of 3 mmHg. 12. No evidence of any gross valvular vegetations.    Neuro/Psych PSYCHIATRIC DISORDERS Anxiety vertigo    GI/Hepatic Neg liver ROS, Pancreatic ca s/p chemo and radiation  to hip  Currently on TPN 2/2 partial gastric outlet obstruction, obstructive jaundice   Endo/Other  Hypothyroidism   Renal/GU negative Renal ROS   Breast ca s/p radiation, lumpectomy  S/p TAHBSO    Musculoskeletal negative musculoskeletal ROS (+)   Abdominal   Peds  Hematology  (+) anemia , JEHOVAH'S WITNESSHct 35.7, plt 374   Anesthesia Other Findings Recently admitted 10/16-10/24 with sepsis and fever of unknown origin. Treated for MRSE bacteremia d/t line sepsis, port removed 10/20. ID, Oncology and CCS seen. Sepsis present on admission. TTE obtained and no obvious vegetations. PICC placed 09/17/19.  Reproductive/Obstetrics negative OB ROS                                                           Anesthesia Evaluation  Patient identified by MRN, date of birth, ID band Patient awake    Reviewed: Allergy & Precautions, H&P , NPO status , Patient's Chart, lab work & pertinent test results  Airway Mallampati: II   Neck ROM: full    Dental   Pulmonary shortness of breath,    breath sounds clear to auscultation       Cardiovascular hypertension, + dysrhythmias  Rhythm:regular Rate:Normal  Hx of HTN, no longer on meds d/t orthostatic hypotension   Neuro/Psych PSYCHIATRIC DISORDERS Anxiety Hx of C spine fx in past    GI/Hepatic Neg liver ROS, Pancreatic CA diagnosed 03/2019   Endo/Other  Hypothyroidism   Renal/GU negative Renal ROS     Musculoskeletal   Abdominal   Peds  Hematology  (+) anemia ,   Anesthesia Other Findings   Reproductive/Obstetrics H/o breast CA                            Anesthesia Physical  Anesthesia Plan  ASA: III  Anesthesia Plan: General   Post-op Pain Management:    Induction: Intravenous  PONV Risk Score and Plan: 3 and Treatment may vary due to age or medical condition, Ondansetron, Dexamethasone and Midazolam  Airway Management Planned: LMA  Additional  Equipment: None  Intra-op Plan:   Post-operative Plan:   Informed Consent: I have reviewed the patients History and Physical, chart, labs and discussed the procedure including the risks, benefits and alternatives for the proposed anesthesia with the patient or authorized representative who has indicated his/her understanding and acceptance.       Plan Discussed with: CRNA  Anesthesia Plan Comments:      Anesthesia Quick Evaluation                                  Anesthesia Evaluation  Patient identified by MRN, date of birth, ID band Patient awake    Reviewed: Allergy & Precautions, H&P , NPO status , Patient's Chart, lab work & pertinent test results  Airway Mallampati: II   Neck ROM: full    Dental   Pulmonary shortness of breath,    breath sounds clear to auscultation       Cardiovascular hypertension, + dysrhythmias  Rhythm:regular Rate:Normal     Neuro/Psych PSYCHIATRIC DISORDERS Anxiety    GI/Hepatic Pancreatic CA   Endo/Other  Hypothyroidism   Renal/GU      Musculoskeletal   Abdominal   Peds  Hematology   Anesthesia Other Findings   Reproductive/Obstetrics H/o breast CA                             Anesthesia Physical Anesthesia Plan  ASA: III  Anesthesia Plan: MAC   Post-op Pain Management:    Induction: Intravenous  PONV Risk Score and Plan: 2 and Propofol infusion and Treatment may vary due to age or medical condition  Airway Management Planned: Nasal Cannula  Additional Equipment:   Intra-op Plan:   Post-operative Plan:   Informed Consent: I have reviewed the patients History and Physical, chart, labs and discussed the procedure including the risks, benefits and alternatives for the proposed anesthesia with the patient or authorized representative who has indicated his/her understanding and acceptance.       Plan Discussed with: CRNA, Anesthesiologist and Surgeon  Anesthesia Plan  Comments:         Anesthesia Quick Evaluation   Anesthesia Physical Anesthesia Plan  ASA: III  Anesthesia Plan: General and Epidural   Post-op Pain Management: GA combined w/ Regional for post-op pain   Induction: Intravenous  PONV Risk Score and Plan: 4 or greater and Ondansetron, Dexamethasone and Treatment may vary due to age or medical condition  Airway Management Planned: Oral ETT  Additional Equipment: Arterial line  Intra-op Plan:   Post-operative Plan: Extubation in OR  Informed Consent: I have reviewed the patients History and Physical, chart, labs and discussed the procedure including the risks, benefits and alternatives for the proposed anesthesia with the patient or authorized representative who has indicated his/her understanding and acceptance.     Dental advisory given  Plan Discussed  with: CRNA  Anesthesia Plan Comments: (Spoke extensively with patient regarding wishes with blood transfusion, as she used to be a [employer]. States that she wishes to not receive blood, even in a life threatening situation. She does accept alternatives, such as albumin and cryoprecipitate.)    Anesthesia Quick Evaluation

## 2019-09-24 LAB — NOVEL CORONAVIRUS, NAA (HOSP ORDER, SEND-OUT TO REF LAB; TAT 18-24 HRS): SARS-CoV-2, NAA: NOT DETECTED

## 2019-09-27 ENCOUNTER — Encounter (HOSPITAL_COMMUNITY): Admission: RE | Disposition: E | Payer: Self-pay | Source: Home / Self Care | Attending: General Surgery

## 2019-09-27 ENCOUNTER — Inpatient Hospital Stay (HOSPITAL_COMMUNITY): Payer: Medicare HMO | Admitting: Certified Registered"

## 2019-09-27 ENCOUNTER — Inpatient Hospital Stay (HOSPITAL_COMMUNITY): Payer: Medicare HMO | Admitting: Physician Assistant

## 2019-09-27 ENCOUNTER — Inpatient Hospital Stay (HOSPITAL_COMMUNITY)
Admission: RE | Admit: 2019-09-27 | Discharge: 2019-11-25 | DRG: 405 | Disposition: E | Payer: Medicare HMO | Attending: General Surgery | Admitting: General Surgery

## 2019-09-27 ENCOUNTER — Other Ambulatory Visit: Payer: Self-pay

## 2019-09-27 ENCOUNTER — Encounter (HOSPITAL_COMMUNITY): Payer: Self-pay | Admitting: *Deleted

## 2019-09-27 ENCOUNTER — Inpatient Hospital Stay (HOSPITAL_COMMUNITY): Payer: Medicare HMO

## 2019-09-27 DIAGNOSIS — I952 Hypotension due to drugs: Secondary | ICD-10-CM | POA: Diagnosis not present

## 2019-09-27 DIAGNOSIS — K3189 Other diseases of stomach and duodenum: Secondary | ICD-10-CM | POA: Diagnosis not present

## 2019-09-27 DIAGNOSIS — J9601 Acute respiratory failure with hypoxia: Secondary | ICD-10-CM | POA: Diagnosis not present

## 2019-09-27 DIAGNOSIS — D62 Acute posthemorrhagic anemia: Secondary | ICD-10-CM | POA: Diagnosis not present

## 2019-09-27 DIAGNOSIS — R0603 Acute respiratory distress: Secondary | ICD-10-CM

## 2019-09-27 DIAGNOSIS — K209 Esophagitis, unspecified without bleeding: Secondary | ICD-10-CM | POA: Diagnosis not present

## 2019-09-27 DIAGNOSIS — Z4659 Encounter for fitting and adjustment of other gastrointestinal appliance and device: Secondary | ICD-10-CM

## 2019-09-27 DIAGNOSIS — R110 Nausea: Secondary | ICD-10-CM

## 2019-09-27 DIAGNOSIS — Z452 Encounter for adjustment and management of vascular access device: Secondary | ICD-10-CM

## 2019-09-27 DIAGNOSIS — Z90710 Acquired absence of both cervix and uterus: Secondary | ICD-10-CM

## 2019-09-27 DIAGNOSIS — R609 Edema, unspecified: Secondary | ICD-10-CM | POA: Diagnosis not present

## 2019-09-27 DIAGNOSIS — R0902 Hypoxemia: Secondary | ICD-10-CM

## 2019-09-27 DIAGNOSIS — E890 Postprocedural hypothyroidism: Secondary | ICD-10-CM | POA: Diagnosis present

## 2019-09-27 DIAGNOSIS — E039 Hypothyroidism, unspecified: Secondary | ICD-10-CM | POA: Diagnosis present

## 2019-09-27 DIAGNOSIS — D689 Coagulation defect, unspecified: Secondary | ICD-10-CM | POA: Diagnosis not present

## 2019-09-27 DIAGNOSIS — Z8619 Personal history of other infectious and parasitic diseases: Secondary | ICD-10-CM

## 2019-09-27 DIAGNOSIS — J90 Pleural effusion, not elsewhere classified: Secondary | ICD-10-CM | POA: Diagnosis not present

## 2019-09-27 DIAGNOSIS — R627 Adult failure to thrive: Secondary | ICD-10-CM | POA: Diagnosis present

## 2019-09-27 DIAGNOSIS — Z8052 Family history of malignant neoplasm of bladder: Secondary | ICD-10-CM

## 2019-09-27 DIAGNOSIS — J9611 Chronic respiratory failure with hypoxia: Secondary | ICD-10-CM | POA: Diagnosis not present

## 2019-09-27 DIAGNOSIS — E440 Moderate protein-calorie malnutrition: Secondary | ICD-10-CM | POA: Diagnosis not present

## 2019-09-27 DIAGNOSIS — R000 Tachycardia, unspecified: Secondary | ICD-10-CM | POA: Diagnosis not present

## 2019-09-27 DIAGNOSIS — E162 Hypoglycemia, unspecified: Secondary | ICD-10-CM | POA: Diagnosis not present

## 2019-09-27 DIAGNOSIS — Z885 Allergy status to narcotic agent status: Secondary | ICD-10-CM

## 2019-09-27 DIAGNOSIS — C779 Secondary and unspecified malignant neoplasm of lymph node, unspecified: Secondary | ICD-10-CM | POA: Diagnosis present

## 2019-09-27 DIAGNOSIS — C250 Malignant neoplasm of head of pancreas: Secondary | ICD-10-CM | POA: Diagnosis present

## 2019-09-27 DIAGNOSIS — J690 Pneumonitis due to inhalation of food and vomit: Secondary | ICD-10-CM | POA: Diagnosis not present

## 2019-09-27 DIAGNOSIS — R1110 Vomiting, unspecified: Secondary | ICD-10-CM

## 2019-09-27 DIAGNOSIS — K660 Peritoneal adhesions (postprocedural) (postinfection): Secondary | ICD-10-CM | POA: Diagnosis not present

## 2019-09-27 DIAGNOSIS — R188 Other ascites: Secondary | ICD-10-CM | POA: Diagnosis not present

## 2019-09-27 DIAGNOSIS — K311 Adult hypertrophic pyloric stenosis: Secondary | ICD-10-CM

## 2019-09-27 DIAGNOSIS — Z20828 Contact with and (suspected) exposure to other viral communicable diseases: Secondary | ICD-10-CM | POA: Diagnosis present

## 2019-09-27 DIAGNOSIS — R739 Hyperglycemia, unspecified: Secondary | ICD-10-CM | POA: Diagnosis not present

## 2019-09-27 DIAGNOSIS — Z1501 Genetic susceptibility to malignant neoplasm of breast: Secondary | ICD-10-CM

## 2019-09-27 DIAGNOSIS — Z79890 Hormone replacement therapy (postmenopausal): Secondary | ICD-10-CM

## 2019-09-27 DIAGNOSIS — Z9221 Personal history of antineoplastic chemotherapy: Secondary | ICD-10-CM

## 2019-09-27 DIAGNOSIS — F329 Major depressive disorder, single episode, unspecified: Secondary | ICD-10-CM | POA: Diagnosis present

## 2019-09-27 DIAGNOSIS — K08409 Partial loss of teeth, unspecified cause, unspecified class: Secondary | ICD-10-CM | POA: Diagnosis present

## 2019-09-27 DIAGNOSIS — F419 Anxiety disorder, unspecified: Secondary | ICD-10-CM | POA: Diagnosis present

## 2019-09-27 DIAGNOSIS — R062 Wheezing: Secondary | ICD-10-CM

## 2019-09-27 DIAGNOSIS — R64 Cachexia: Secondary | ICD-10-CM | POA: Diagnosis present

## 2019-09-27 DIAGNOSIS — Z6826 Body mass index (BMI) 26.0-26.9, adult: Secondary | ICD-10-CM

## 2019-09-27 DIAGNOSIS — J9690 Respiratory failure, unspecified, unspecified whether with hypoxia or hypercapnia: Secondary | ICD-10-CM

## 2019-09-27 DIAGNOSIS — Z808 Family history of malignant neoplasm of other organs or systems: Secondary | ICD-10-CM

## 2019-09-27 DIAGNOSIS — J189 Pneumonia, unspecified organism: Secondary | ICD-10-CM | POA: Diagnosis not present

## 2019-09-27 DIAGNOSIS — E8770 Fluid overload, unspecified: Secondary | ICD-10-CM | POA: Diagnosis not present

## 2019-09-27 DIAGNOSIS — Y92234 Operating room of hospital as the place of occurrence of the external cause: Secondary | ICD-10-CM | POA: Diagnosis not present

## 2019-09-27 DIAGNOSIS — Z66 Do not resuscitate: Secondary | ICD-10-CM | POA: Diagnosis not present

## 2019-09-27 DIAGNOSIS — R531 Weakness: Secondary | ICD-10-CM

## 2019-09-27 DIAGNOSIS — Z981 Arthrodesis status: Secondary | ICD-10-CM

## 2019-09-27 DIAGNOSIS — K30 Functional dyspepsia: Secondary | ICD-10-CM | POA: Diagnosis not present

## 2019-09-27 DIAGNOSIS — Z515 Encounter for palliative care: Secondary | ICD-10-CM | POA: Diagnosis not present

## 2019-09-27 DIAGNOSIS — Z15068 Genetic susceptibility to other malignant neoplasm of digestive system: Secondary | ICD-10-CM | POA: Diagnosis present

## 2019-09-27 DIAGNOSIS — K2100 Gastro-esophageal reflux disease with esophagitis, without bleeding: Secondary | ICD-10-CM | POA: Diagnosis not present

## 2019-09-27 DIAGNOSIS — Z8719 Personal history of other diseases of the digestive system: Secondary | ICD-10-CM

## 2019-09-27 DIAGNOSIS — Y95 Nosocomial condition: Secondary | ICD-10-CM | POA: Diagnosis not present

## 2019-09-27 DIAGNOSIS — E43 Unspecified severe protein-calorie malnutrition: Secondary | ICD-10-CM | POA: Diagnosis present

## 2019-09-27 DIAGNOSIS — T82848A Pain from vascular prosthetic devices, implants and grafts, initial encounter: Secondary | ICD-10-CM | POA: Diagnosis not present

## 2019-09-27 DIAGNOSIS — Z800 Family history of malignant neoplasm of digestive organs: Secondary | ICD-10-CM

## 2019-09-27 DIAGNOSIS — Y848 Other medical procedures as the cause of abnormal reaction of the patient, or of later complication, without mention of misadventure at the time of the procedure: Secondary | ICD-10-CM | POA: Diagnosis not present

## 2019-09-27 DIAGNOSIS — T411X5A Adverse effect of intravenous anesthetics, initial encounter: Secondary | ICD-10-CM | POA: Diagnosis not present

## 2019-09-27 DIAGNOSIS — Z1509 Genetic susceptibility to other malignant neoplasm: Secondary | ICD-10-CM | POA: Diagnosis present

## 2019-09-27 DIAGNOSIS — Z789 Other specified health status: Secondary | ICD-10-CM | POA: Diagnosis not present

## 2019-09-27 DIAGNOSIS — K0889 Other specified disorders of teeth and supporting structures: Secondary | ICD-10-CM | POA: Diagnosis present

## 2019-09-27 DIAGNOSIS — I9581 Postprocedural hypotension: Secondary | ICD-10-CM

## 2019-09-27 DIAGNOSIS — Z79899 Other long term (current) drug therapy: Secondary | ICD-10-CM

## 2019-09-27 DIAGNOSIS — T85598A Other mechanical complication of other gastrointestinal prosthetic devices, implants and grafts, initial encounter: Secondary | ICD-10-CM

## 2019-09-27 DIAGNOSIS — Z853 Personal history of malignant neoplasm of breast: Secondary | ICD-10-CM

## 2019-09-27 DIAGNOSIS — D696 Thrombocytopenia, unspecified: Secondary | ICD-10-CM | POA: Diagnosis not present

## 2019-09-27 DIAGNOSIS — Z0189 Encounter for other specified special examinations: Secondary | ICD-10-CM

## 2019-09-27 DIAGNOSIS — E876 Hypokalemia: Secondary | ICD-10-CM

## 2019-09-27 DIAGNOSIS — C259 Malignant neoplasm of pancreas, unspecified: Secondary | ICD-10-CM | POA: Diagnosis present

## 2019-09-27 DIAGNOSIS — Z531 Procedure and treatment not carried out because of patient's decision for reasons of belief and group pressure: Secondary | ICD-10-CM | POA: Diagnosis not present

## 2019-09-27 DIAGNOSIS — K529 Noninfective gastroenteritis and colitis, unspecified: Secondary | ICD-10-CM | POA: Diagnosis present

## 2019-09-27 DIAGNOSIS — D630 Anemia in neoplastic disease: Secondary | ICD-10-CM | POA: Diagnosis present

## 2019-09-27 DIAGNOSIS — I864 Gastric varices: Secondary | ICD-10-CM | POA: Diagnosis not present

## 2019-09-27 DIAGNOSIS — Z419 Encounter for procedure for purposes other than remedying health state, unspecified: Secondary | ICD-10-CM

## 2019-09-27 DIAGNOSIS — Z7189 Other specified counseling: Secondary | ICD-10-CM | POA: Diagnosis not present

## 2019-09-27 DIAGNOSIS — Z923 Personal history of irradiation: Secondary | ICD-10-CM

## 2019-09-27 HISTORY — PX: GASTROJEJUNOSTOMY: SHX1697

## 2019-09-27 HISTORY — PX: LAPAROSCOPY: SHX197

## 2019-09-27 HISTORY — PX: WHIPPLE PROCEDURE: SHX2667

## 2019-09-27 LAB — GLUCOSE, CAPILLARY
Glucose-Capillary: 272 mg/dL — ABNORMAL HIGH (ref 70–99)
Glucose-Capillary: 282 mg/dL — ABNORMAL HIGH (ref 70–99)
Glucose-Capillary: 291 mg/dL — ABNORMAL HIGH (ref 70–99)
Glucose-Capillary: 292 mg/dL — ABNORMAL HIGH (ref 70–99)
Glucose-Capillary: 264 mg/dL — ABNORMAL HIGH (ref 70–99)
Glucose-Capillary: 208 mg/dL — ABNORMAL HIGH (ref 70–99)
Glucose-Capillary: 229 mg/dL — ABNORMAL HIGH (ref 70–99)
Glucose-Capillary: 186 mg/dL — ABNORMAL HIGH (ref 70–99)
Glucose-Capillary: 160 mg/dL — ABNORMAL HIGH (ref 70–99)

## 2019-09-27 LAB — COMPREHENSIVE METABOLIC PANEL
ALT: 26 U/L (ref 0–44)
AST: 41 U/L (ref 15–41)
Albumin: 3.7 g/dL (ref 3.5–5.0)
Alkaline Phosphatase: 59 U/L (ref 38–126)
Anion gap: 16 — ABNORMAL HIGH (ref 5–15)
BUN: 11 mg/dL (ref 8–23)
CO2: 19 mmol/L — ABNORMAL LOW (ref 22–32)
Calcium: 8.2 mg/dL — ABNORMAL LOW (ref 8.9–10.3)
Chloride: 103 mmol/L (ref 98–111)
Creatinine, Ser: 0.89 mg/dL (ref 0.44–1.00)
GFR calc Af Amer: >60 mL/min (ref >60)
GFR calc non Af Amer: >60 mL/min (ref >60)
Glucose, Bld: 308 mg/dL — ABNORMAL HIGH (ref 70–99)
Potassium: 3.5 mmol/L (ref 3.5–5.1)
Sodium: 138 mmol/L (ref 135–145)
Total Bilirubin: 1 mg/dL (ref 0.3–1.2)
Total Protein: 5.4 g/dL — ABNORMAL LOW (ref 6.5–8.1)

## 2019-09-27 LAB — TROPONIN I (HIGH SENSITIVITY)
Troponin I (High Sensitivity): 8 ng/L (ref <18)
Troponin I (High Sensitivity): 10 ng/L (ref <18)

## 2019-09-27 LAB — POCT I-STAT, CHEM 8
BUN: 9 mg/dL (ref 8–23)
Calcium, Ion: 1.16 mmol/L (ref 1.15–1.40)
Chloride: 106 mmol/L (ref 98–111)
Creatinine, Ser: 0.5 mg/dL (ref 0.44–1.00)
Glucose, Bld: 242 mg/dL — ABNORMAL HIGH (ref 70–99)
HCT: 16 % — ABNORMAL LOW (ref 36.0–46.0)
Hemoglobin: 5.4 g/dL — CL (ref 12.0–15.0)
Potassium: 3.2 mmol/L — ABNORMAL LOW (ref 3.5–5.1)
Sodium: 140 mmol/L (ref 135–145)
TCO2: 22 mmol/L (ref 22–32)

## 2019-09-27 LAB — POCT I-STAT 7, (LYTES, BLD GAS, ICA,H+H)
Acid-base deficit: 2 mmol/L (ref 0.0–2.0)
Acid-base deficit: 4 mmol/L — ABNORMAL HIGH (ref 0.0–2.0)
Bicarbonate: 22.7 mmol/L (ref 20.0–28.0)
Bicarbonate: 21.4 mmol/L (ref 20.0–28.0)
Calcium, Ion: 1.19 mmol/L (ref 1.15–1.40)
Calcium, Ion: 1.14 mmol/L — ABNORMAL LOW (ref 1.15–1.40)
HCT: 27 % — ABNORMAL LOW (ref 36.0–46.0)
HCT: 16 % — ABNORMAL LOW (ref 36.0–46.0)
Hemoglobin: 9.2 g/dL — ABNORMAL LOW (ref 12.0–15.0)
Hemoglobin: 5.4 g/dL — CL (ref 12.0–15.0)
O2 Saturation: 100 %
O2 Saturation: 100 %
Patient temperature: 36.6
Patient temperature: 36.8
Potassium: 3.5 mmol/L (ref 3.5–5.1)
Potassium: 3.2 mmol/L — ABNORMAL LOW (ref 3.5–5.1)
Sodium: 139 mmol/L (ref 135–145)
Sodium: 140 mmol/L (ref 135–145)
TCO2: 24 mmol/L (ref 22–32)
TCO2: 23 mmol/L (ref 22–32)
pCO2 arterial: 35.4 mmHg (ref 32.0–48.0)
pCO2 arterial: 40.4 mmHg (ref 32.0–48.0)
pH, Arterial: 7.412 (ref 7.350–7.450)
pH, Arterial: 7.331 — ABNORMAL LOW (ref 7.350–7.450)
pO2, Arterial: 238 mmHg — ABNORMAL HIGH (ref 83.0–108.0)
pO2, Arterial: 455 mmHg — ABNORMAL HIGH (ref 83.0–108.0)

## 2019-09-27 LAB — CBC
HCT: 16.5 % — ABNORMAL LOW (ref 36.0–46.0)
Hemoglobin: 5.5 g/dL — CL (ref 12.0–15.0)
MCH: 33.3 pg (ref 26.0–34.0)
MCHC: 33.3 g/dL (ref 30.0–36.0)
MCV: 100 fL (ref 80.0–100.0)
Platelets: 189 10*3/uL (ref 150–400)
RBC: 1.65 MIL/uL — ABNORMAL LOW (ref 3.87–5.11)
RDW: 15.7 % — ABNORMAL HIGH (ref 11.5–15.5)
WBC: 11 10*3/uL — ABNORMAL HIGH (ref 4.0–10.5)
nRBC: 0 % (ref 0.0–0.2)

## 2019-09-27 LAB — PROTIME-INR
INR: 1.5 — ABNORMAL HIGH (ref 0.8–1.2)
INR: 1.2 (ref 0.8–1.2)
Prothrombin Time: 17.6 seconds — ABNORMAL HIGH (ref 11.4–15.2)
Prothrombin Time: 15.3 seconds — ABNORMAL HIGH (ref 11.4–15.2)

## 2019-09-27 LAB — VITAMIN B12: Vitamin B-12: >7500 pg/mL — ABNORMAL HIGH (ref 180–914)

## 2019-09-27 LAB — FIBRINOGEN: Fibrinogen: 370 mg/dL (ref 210–475)

## 2019-09-27 LAB — LACTIC ACID, PLASMA
Lactic Acid, Venous: 3.2 mmol/L (ref 0.5–1.9)
Lactic Acid, Venous: 7.2 mmol/L (ref 0.5–1.9)

## 2019-09-27 LAB — FERRITIN: Ferritin: 308 ng/mL — ABNORMAL HIGH (ref 11–307)

## 2019-09-27 LAB — APTT: aPTT: <20 seconds — ABNORMAL LOW (ref 24–36)

## 2019-09-27 LAB — MAGNESIUM: Magnesium: 1.6 mg/dL — ABNORMAL LOW (ref 1.7–2.4)

## 2019-09-27 SURGERY — WHIPPLE PROCEDURE
Anesthesia: Epidural | Site: Abdomen

## 2019-09-27 MED ORDER — PROPOFOL 10 MG/ML IV BOLUS
INTRAVENOUS | Status: AC
  Filled 2019-09-27: qty 40

## 2019-09-27 MED ORDER — DEXAMETHASONE SODIUM PHOSPHATE 10 MG/ML IJ SOLN
INTRAMUSCULAR | Status: DC | PRN
  Administered 2019-09-27: 10 mg via INTRAVENOUS

## 2019-09-27 MED ORDER — LACTATED RINGERS IV SOLN
INTRAVENOUS | Status: DC | PRN
  Administered 2019-09-27 (×2): via INTRAVENOUS

## 2019-09-27 MED ORDER — ONDANSETRON 4 MG PO TBDP: 4.0000 mg | ORAL_TABLET | Freq: Four times a day (QID) | ORAL | Status: DC | PRN

## 2019-09-27 MED ORDER — ORAL CARE MOUTH RINSE
15.0000 mL | Freq: Two times a day (BID) | OROMUCOSAL | Status: DC
  Administered 2019-09-27 – 2019-11-08 (×44): 15 mL via OROMUCOSAL

## 2019-09-27 MED ORDER — MIDAZOLAM HCL 2 MG/2ML IJ SOLN
INTRAMUSCULAR | Status: DC | PRN
  Administered 2019-09-27: 1 mg via INTRAVENOUS

## 2019-09-27 MED ORDER — PROMETHAZINE HCL 25 MG/ML IJ SOLN: 6.2500 mg | INTRAMUSCULAR | Status: DC | PRN

## 2019-09-27 MED ORDER — ACETAMINOPHEN 10 MG/ML IV SOLN
1000.0000 mg | Freq: Four times a day (QID) | INTRAVENOUS | Status: AC
  Administered 2019-09-27 – 2019-09-28 (×4): 1000 mg via INTRAVENOUS
  Filled 2019-09-27 (×4): qty 100

## 2019-09-27 MED ORDER — PHENYLEPHRINE HCL-NACL 10-0.9 MG/250ML-% IV SOLN
INTRAVENOUS | Status: DC | PRN
  Administered 2019-09-27: 15 ug/min via INTRAVENOUS

## 2019-09-27 MED ORDER — "VISTASEAL 4 ML SINGLE DOSE KIT "
4.0000 mL | PACK | Freq: Once | CUTANEOUS | Status: DC
  Filled 2019-09-27: qty 4

## 2019-09-27 MED ORDER — CHLORHEXIDINE GLUCONATE CLOTH 2 % EX PADS: 6.0000 | MEDICATED_PAD | Freq: Once | CUTANEOUS | Status: DC

## 2019-09-27 MED ORDER — DEXAMETHASONE SODIUM PHOSPHATE 10 MG/ML IJ SOLN
INTRAMUSCULAR | Status: AC
  Filled 2019-09-27: qty 1

## 2019-09-27 MED ORDER — KETAMINE HCL 50 MG/5ML IJ SOSY
PREFILLED_SYRINGE | INTRAMUSCULAR | Status: AC
  Filled 2019-09-27: qty 5

## 2019-09-27 MED ORDER — PHENYLEPHRINE HCL-NACL 10-0.9 MG/250ML-% IV SOLN: 0.0000 ug/min | INTRAVENOUS | Status: DC

## 2019-09-27 MED ORDER — PANTOPRAZOLE SODIUM 40 MG IV SOLR
40.0000 mg | Freq: Every day | INTRAVENOUS | Status: DC
  Administered 2019-09-27 – 2019-10-06 (×10): 40 mg via INTRAVENOUS
  Filled 2019-09-27 (×10): qty 40

## 2019-09-27 MED ORDER — ROPIVACAINE HCL 2 MG/ML IJ SOLN
10.0000 mL/h | INTRAMUSCULAR | Status: DC
  Filled 2019-09-27: qty 200

## 2019-09-27 MED ORDER — WHITE PETROLATUM EX OINT
TOPICAL_OINTMENT | CUTANEOUS | Status: AC
  Administered 2019-09-27: 0.2
  Filled 2019-09-27: qty 28.35

## 2019-09-27 MED ORDER — 0.9 % SODIUM CHLORIDE (POUR BTL) OPTIME
TOPICAL | Status: DC | PRN
  Administered 2019-09-27 (×2): 1000 mL

## 2019-09-27 MED ORDER — LIDOCAINE 2% (20 MG/ML) 5 ML SYRINGE
INTRAMUSCULAR | Status: DC | PRN
  Administered 2019-09-27: 20 mg via INTRAVENOUS

## 2019-09-27 MED ORDER — EPINEPHRINE 1 MG/10ML IJ SOSY
PREFILLED_SYRINGE | INTRAMUSCULAR | Status: DC | PRN
  Administered 2019-09-27: .1 via INTRAVENOUS

## 2019-09-27 MED ORDER — SUCCINYLCHOLINE CHLORIDE 200 MG/10ML IV SOSY
PREFILLED_SYRINGE | INTRAVENOUS | Status: DC | PRN
  Administered 2019-09-27: 100 mg via INTRAVENOUS

## 2019-09-27 MED ORDER — ALBUMIN HUMAN 5 % IV SOLN
INTRAVENOUS | Status: DC | PRN
  Administered 2019-09-27 (×4): via INTRAVENOUS

## 2019-09-27 MED ORDER — SODIUM CHLORIDE 0.9 % IV SOLN
INTRAVENOUS | Status: DC | PRN
  Administered 2019-09-27 – 2019-10-02 (×3): 500 mL via INTRAVENOUS

## 2019-09-27 MED ORDER — CYANOCOBALAMIN 1000 MCG/ML IJ SOLN
1000.0000 ug | Freq: Once | INTRAMUSCULAR | Status: AC
  Administered 2019-09-27: 1000 ug via INTRAMUSCULAR
  Filled 2019-09-27: qty 1

## 2019-09-27 MED ORDER — ROCURONIUM BROMIDE 10 MG/ML (PF) SYRINGE
PREFILLED_SYRINGE | INTRAVENOUS | Status: DC | PRN
  Administered 2019-09-27: 20 mg via INTRAVENOUS
  Administered 2019-09-27: 30 mg via INTRAVENOUS
  Administered 2019-09-27 (×2): 20 mg via INTRAVENOUS
  Administered 2019-09-27: 50 mg via INTRAVENOUS

## 2019-09-27 MED ORDER — ONDANSETRON HCL 4 MG/2ML IJ SOLN
INTRAMUSCULAR | Status: AC
  Filled 2019-09-27: qty 2

## 2019-09-27 MED ORDER — DIPHENHYDRAMINE HCL 12.5 MG/5ML PO ELIX
12.5000 mg | ORAL_SOLUTION | Freq: Four times a day (QID) | ORAL | Status: DC | PRN
  Administered 2019-10-09: 12.5 mg via ORAL
  Filled 2019-09-27: qty 5

## 2019-09-27 MED ORDER — KETOROLAC TROMETHAMINE 15 MG/ML IJ SOLN
15.0000 mg | Freq: Once | INTRAMUSCULAR | Status: AC | PRN
  Administered 2019-09-27: 15:00:00 15 mg via INTRAVENOUS

## 2019-09-27 MED ORDER — HYDROMORPHONE HCL 1 MG/ML IJ SOLN
INTRAMUSCULAR | Status: AC
  Filled 2019-09-27: qty 0.5

## 2019-09-27 MED ORDER — VISTASEAL 10 ML SINGLE DOSE KIT
PACK | CUTANEOUS | Status: DC | PRN
  Administered 2019-09-27: 2 mL via TOPICAL
  Administered 2019-09-27 (×2): 4 mL via TOPICAL

## 2019-09-27 MED ORDER — INSULIN REGULAR(HUMAN) IN NACL 100-0.9 UT/100ML-% IV SOLN
INTRAVENOUS | Status: DC
  Administered 2019-09-27: 2.3 [IU]/h via INTRAVENOUS
  Filled 2019-09-27: qty 100

## 2019-09-27 MED ORDER — METHOCARBAMOL 1000 MG/10ML IJ SOLN
500.0000 mg | Freq: Three times a day (TID) | INTRAVENOUS | Status: DC | PRN
  Administered 2019-09-27 – 2019-10-05 (×4): 500 mg via INTRAVENOUS
  Filled 2019-09-27 (×2): qty 5
  Filled 2019-09-27 (×2): qty 500
  Filled 2019-09-27: qty 5

## 2019-09-27 MED ORDER — ALBUMIN HUMAN 25 % IV SOLN
25.0000 g | Freq: Four times a day (QID) | INTRAVENOUS | Status: AC
  Administered 2019-09-27 – 2019-09-29 (×8): 25 g via INTRAVENOUS
  Filled 2019-09-27: qty 100
  Filled 2019-09-27: qty 50
  Filled 2019-09-27 (×8): qty 100

## 2019-09-27 MED ORDER — FENTANYL CITRATE (PF) 250 MCG/5ML IJ SOLN
INTRAMUSCULAR | Status: AC
  Filled 2019-09-27: qty 5

## 2019-09-27 MED ORDER — PHENYLEPHRINE 40 MCG/ML (10ML) SYRINGE FOR IV PUSH (FOR BLOOD PRESSURE SUPPORT)
PREFILLED_SYRINGE | INTRAVENOUS | Status: DC | PRN
  Administered 2019-09-27: 60 ug via INTRAVENOUS

## 2019-09-27 MED ORDER — SODIUM BICARBONATE 8.4 % IV SOLN
INTRAVENOUS | Status: DC | PRN
  Administered 2019-09-27: 25 mL via INTRAVENOUS

## 2019-09-27 MED ORDER — DARBEPOETIN ALFA 25 MCG/0.42ML IJ SOSY
25.0000 ug | PREFILLED_SYRINGE | INTRAMUSCULAR | Status: DC
  Administered 2019-09-27: 25 ug via SUBCUTANEOUS
  Filled 2019-09-27 (×3): qty 0.42

## 2019-09-27 MED ORDER — FENTANYL CITRATE (PF) 100 MCG/2ML IJ SOLN
12.5000 ug | INTRAMUSCULAR | Status: DC | PRN
  Administered 2019-09-27 – 2019-09-28 (×11): 25 ug via INTRAVENOUS
  Filled 2019-09-27 (×10): qty 2

## 2019-09-27 MED ORDER — KETAMINE HCL 50 MG/ML IJ SOLN
INTRAMUSCULAR | Status: DC | PRN
  Administered 2019-09-27: 30 mg via INTRAMUSCULAR
  Administered 2019-09-27 (×3): 10 mg via INTRAMUSCULAR

## 2019-09-27 MED ORDER — FENTANYL CITRATE (PF) 100 MCG/2ML IJ SOLN: 25.0000 ug | INTRAMUSCULAR | Status: DC | PRN

## 2019-09-27 MED ORDER — LIDOCAINE-EPINEPHRINE 1 %-1:100000 IJ SOLN
INTRAMUSCULAR | Status: AC
  Filled 2019-09-27: qty 1

## 2019-09-27 MED ORDER — CHLORHEXIDINE GLUCONATE CLOTH 2 % EX PADS
6.0000 | MEDICATED_PAD | Freq: Every day | CUTANEOUS | Status: DC
  Administered 2019-09-27 – 2019-10-27 (×27): 6 via TOPICAL

## 2019-09-27 MED ORDER — ESMOLOL HCL 100 MG/10ML IV SOLN
INTRAVENOUS | Status: DC | PRN
  Administered 2019-09-27 (×2): 10 mg via INTRAVENOUS

## 2019-09-27 MED ORDER — NOREPINEPHRINE 4 MG/250ML-% IV SOLN
INTRAVENOUS | Status: DC | PRN
  Administered 2019-09-27: 4 ug/min via INTRAVENOUS

## 2019-09-27 MED ORDER — VECURONIUM BROMIDE 10 MG IV SOLR
INTRAVENOUS | Status: AC
  Filled 2019-09-27: qty 10

## 2019-09-27 MED ORDER — SUGAMMADEX SODIUM 200 MG/2ML IV SOLN
INTRAVENOUS | Status: DC | PRN
  Administered 2019-09-27: 119.6 mg via INTRAVENOUS

## 2019-09-27 MED ORDER — CEFAZOLIN SODIUM-DEXTROSE 2-4 GM/100ML-% IV SOLN
INTRAVENOUS | Status: AC
  Filled 2019-09-27: qty 100

## 2019-09-27 MED ORDER — BUPIVACAINE HCL (PF) 0.25 % IJ SOLN
INTRAMUSCULAR | Status: AC
  Filled 2019-09-27: qty 30

## 2019-09-27 MED ORDER — LACTATED RINGERS IV SOLN
INTRAVENOUS | Status: DC | PRN
  Administered 2019-09-27: 07:00:00 via INTRAVENOUS

## 2019-09-27 MED ORDER — CEFAZOLIN SODIUM-DEXTROSE 2-4 GM/100ML-% IV SOLN
2.0000 g | INTRAVENOUS | Status: AC
  Administered 2019-09-27 (×2): 2 g via INTRAVENOUS

## 2019-09-27 MED ORDER — HYDROCORTISONE (PERIANAL) 2.5 % EX CREA: 1.0000 "application " | TOPICAL_CREAM | Freq: Every day | CUTANEOUS | Status: DC | PRN

## 2019-09-27 MED ORDER — VASOPRESSIN 20 UNIT/ML IV SOLN
INTRAVENOUS | Status: AC
  Filled 2019-09-27: qty 1

## 2019-09-27 MED ORDER — ACETAMINOPHEN 500 MG PO TABS
1000.0000 mg | ORAL_TABLET | ORAL | Status: AC
  Administered 2019-09-27: 06:00:00 1000 mg via ORAL

## 2019-09-27 MED ORDER — MIDAZOLAM HCL 2 MG/2ML IJ SOLN
INTRAMUSCULAR | Status: AC
  Filled 2019-09-27: qty 2

## 2019-09-27 MED ORDER — WATER FOR IRRIGATION, STERILE IR SOLN
Status: DC | PRN
  Administered 2019-09-27 (×2): 1000 mL

## 2019-09-27 MED ORDER — METOPROLOL TARTRATE 5 MG/5ML IV SOLN
5.0000 mg | Freq: Four times a day (QID) | INTRAVENOUS | Status: DC | PRN
  Administered 2019-10-19 – 2019-10-20 (×2): 5 mg via INTRAVENOUS
  Filled 2019-09-27 (×2): qty 5

## 2019-09-27 MED ORDER — SODIUM CHLORIDE 0.9 % IV SOLN
5.0000 mg | Freq: Every day | INTRAVENOUS | Status: AC
  Administered 2019-09-27 – 2019-10-01 (×5): 5 mg via INTRAVENOUS
  Filled 2019-09-27 (×5): qty 1

## 2019-09-27 MED ORDER — KETOROLAC TROMETHAMINE 15 MG/ML IJ SOLN
INTRAMUSCULAR | Status: AC
  Administered 2019-09-27: 15 mg via INTRAVENOUS
  Filled 2019-09-27: qty 1

## 2019-09-27 MED ORDER — ONDANSETRON HCL 4 MG/2ML IJ SOLN
4.0000 mg | Freq: Four times a day (QID) | INTRAMUSCULAR | Status: DC | PRN
  Administered 2019-09-27 – 2019-11-09 (×48): 4 mg via INTRAVENOUS
  Filled 2019-09-27 (×48): qty 2

## 2019-09-27 MED ORDER — KCL IN DEXTROSE-NACL 20-5-0.9 MEQ/L-%-% IV SOLN
INTRAVENOUS | Status: DC
  Administered 2019-09-27 – 2019-10-20 (×27): via INTRAVENOUS
  Filled 2019-09-27 (×38): qty 1000

## 2019-09-27 MED ORDER — EPHEDRINE SULFATE-NACL 50-0.9 MG/10ML-% IV SOSY
PREFILLED_SYRINGE | INTRAVENOUS | Status: DC | PRN
  Administered 2019-09-27: 10 mg via INTRAVENOUS

## 2019-09-27 MED ORDER — IRON DEXTRAN 50 MG/ML IJ SOLN
100.0000 mg | Freq: Once | INTRAMUSCULAR | Status: AC
  Administered 2019-09-27: 100 mg via INTRAVENOUS
  Filled 2019-09-27 (×2): qty 2

## 2019-09-27 MED ORDER — LIDOCAINE-EPINEPHRINE 1 %-1:100000 IJ SOLN
INTRAMUSCULAR | Status: DC | PRN
  Administered 2019-09-27: 5 mL

## 2019-09-27 MED ORDER — ACETAMINOPHEN 500 MG PO TABS
ORAL_TABLET | ORAL | Status: AC
  Administered 2019-09-27: 1000 mg via ORAL
  Filled 2019-09-27: qty 2

## 2019-09-27 MED ORDER — CEFAZOLIN SODIUM-DEXTROSE 2-4 GM/100ML-% IV SOLN
2.0000 g | Freq: Three times a day (TID) | INTRAVENOUS | Status: AC
  Administered 2019-09-27: 2 g via INTRAVENOUS
  Filled 2019-09-27: qty 100

## 2019-09-27 MED ORDER — NOREPINEPHRINE 4 MG/250ML-% IV SOLN
0.0000 ug/min | INTRAVENOUS | Status: DC
  Administered 2019-09-27: 15 ug/min via INTRAVENOUS
  Administered 2019-09-27: 14 ug/min via INTRAVENOUS
  Filled 2019-09-27 (×2): qty 250

## 2019-09-27 MED ORDER — DIPHENHYDRAMINE HCL 50 MG/ML IJ SOLN
12.5000 mg | Freq: Four times a day (QID) | INTRAMUSCULAR | Status: DC | PRN
  Administered 2019-09-28 – 2019-11-09 (×12): 12.5 mg via INTRAVENOUS
  Filled 2019-09-27 (×13): qty 1

## 2019-09-27 MED ORDER — INSULIN ASPART 100 UNIT/ML ~~LOC~~ SOLN: 2.0000 [IU] | SUBCUTANEOUS | Status: DC

## 2019-09-27 MED ORDER — FENTANYL CITRATE (PF) 250 MCG/5ML IJ SOLN
INTRAMUSCULAR | Status: DC | PRN
  Administered 2019-09-27: 100 ug via INTRAVENOUS
  Administered 2019-09-27 (×7): 50 ug via INTRAVENOUS

## 2019-09-27 MED ORDER — HYDROMORPHONE HCL 1 MG/ML IJ SOLN
INTRAMUSCULAR | Status: DC | PRN
  Administered 2019-09-27: 0.5 mg via INTRAVENOUS

## 2019-09-27 MED ORDER — SUCCINYLCHOLINE CHLORIDE 200 MG/10ML IV SOSY
PREFILLED_SYRINGE | INTRAVENOUS | Status: AC
  Filled 2019-09-27: qty 10

## 2019-09-27 MED ORDER — BUPIVACAINE HCL (PF) 0.25 % IJ SOLN
INTRAMUSCULAR | Status: DC | PRN
  Administered 2019-09-27: 7 mL via EPIDURAL

## 2019-09-27 MED ORDER — BUPIVACAINE HCL (PF) 0.25 % IJ SOLN
INTRAMUSCULAR | Status: DC | PRN
  Administered 2019-09-27: 5 mL

## 2019-09-27 MED ORDER — VASOPRESSIN 20 UNIT/ML IV SOLN
0.0100 [IU]/min | INTRAVENOUS | Status: DC
  Filled 2019-09-27 (×2): qty 2

## 2019-09-27 MED ORDER — ROPIVACAINE HCL 2 MG/ML IJ SOLN
INTRAMUSCULAR | Status: DC | PRN
  Administered 2019-09-27: 6 mL/h via EPIDURAL

## 2019-09-27 MED ORDER — VASOPRESSIN 20 UNIT/ML IV SOLN
INTRAVENOUS | Status: DC | PRN
  Administered 2019-09-27: 13:00:00 .04 [IU]/min via INTRAVENOUS

## 2019-09-27 MED ORDER — CHLORHEXIDINE GLUCONATE 0.12 % MT SOLN
15.0000 mL | Freq: Two times a day (BID) | OROMUCOSAL | Status: DC
  Administered 2019-09-27 – 2019-11-09 (×72): 15 mL via OROMUCOSAL
  Filled 2019-09-27 (×65): qty 15

## 2019-09-27 MED ORDER — VASOPRESSIN 20 UNIT/ML IV SOLN
INTRAVENOUS | Status: DC | PRN
  Administered 2019-09-27: 1 [IU] via INTRAVENOUS
  Administered 2019-09-27: .5 [IU] via INTRAVENOUS

## 2019-09-27 MED ORDER — FENTANYL CITRATE (PF) 100 MCG/2ML IJ SOLN
INTRAMUSCULAR | Status: AC
  Filled 2019-09-27: qty 2

## 2019-09-27 MED ORDER — LEVOTHYROXINE SODIUM 100 MCG/5ML IV SOLN
25.0000 ug | Freq: Every day | INTRAVENOUS | Status: DC
  Administered 2019-09-28 – 2019-10-10 (×13): 25 ug via INTRAVENOUS
  Filled 2019-09-27 (×13): qty 5

## 2019-09-27 MED ORDER — PROPOFOL 10 MG/ML IV BOLUS
INTRAVENOUS | Status: DC | PRN
  Administered 2019-09-27: 100 mg via INTRAVENOUS

## 2019-09-27 SURGICAL SUPPLY — 110 items
APL PRP STRL LF DISP 70% ISPRP (MISCELLANEOUS) ×1
BAG BILE T-TUBES STRL (MISCELLANEOUS) ×6 IMPLANT
BAG DRN 9.5 2 ADJ BELT ADPR (MISCELLANEOUS) ×2
BLADE CLIPPER SURG (BLADE) IMPLANT
BLADE SURG 10 STRL SS (BLADE) ×1 IMPLANT
BOOT SUTURE AID YELLOW STND (SUTURE) ×3 IMPLANT
BRR ADH 5X3 SEPRAFILM 6 SHT (MISCELLANEOUS)
CANISTER SUCT 3000ML PPV (MISCELLANEOUS) ×6 IMPLANT
CATH KIT ON-Q SILVERSOAK 7.5 (CATHETERS) IMPLANT
CATH KIT ON-Q SILVERSOAK 7.5IN (CATHETERS) IMPLANT
CATH ROBINSON RED A/P 16FR (CATHETERS) IMPLANT
CHLORAPREP W/TINT 26 (MISCELLANEOUS) ×3 IMPLANT
CLIP VESOCCLUDE LG 6/CT (CLIP) ×3 IMPLANT
CLIP VESOCCLUDE MED 24/CT (CLIP) ×3 IMPLANT
CLIP VESOLOCK LG 6/CT PURPLE (CLIP) ×15 IMPLANT
CLIP VESOLOCK MED 6/CT (CLIP) ×3 IMPLANT
CLIP VESOLOCK MED LG 6/CT (CLIP) ×5 IMPLANT
CONT SPEC 4OZ CLIKSEAL STRL BL (MISCELLANEOUS) IMPLANT
COUNTER NEEDLE 20 DBL MAG RED (NEEDLE) IMPLANT
COVER MAYO STAND STRL (DRAPES) ×3 IMPLANT
COVER SURGICAL LIGHT HANDLE (MISCELLANEOUS) ×3 IMPLANT
COVER WAND RF STERILE (DRAPES) ×3 IMPLANT
DRAIN CHANNEL 19F RND (DRAIN) ×6 IMPLANT
DRAIN PENROSE 18X1/2 LTX STRL (DRAIN) ×3 IMPLANT
DRAPE LAPAROSCOPIC ABDOMINAL (DRAPES) ×3 IMPLANT
DRAPE WARM FLUID 44X44 (DRAPES) ×3 IMPLANT
DRSG COVADERM 4X10 (GAUZE/BANDAGES/DRESSINGS) IMPLANT
DRSG COVADERM 4X14 (GAUZE/BANDAGES/DRESSINGS) ×2 IMPLANT
DRSG COVADERM 4X6 (GAUZE/BANDAGES/DRESSINGS) ×2 IMPLANT
DRSG COVADERM 4X8 (GAUZE/BANDAGES/DRESSINGS) IMPLANT
DRSG TELFA 3X8 NADH (GAUZE/BANDAGES/DRESSINGS) IMPLANT
ELECT BLADE 4.0 EZ CLEAN MEGAD (MISCELLANEOUS) ×3
ELECT BLADE 6.5 EXT (BLADE) ×3 IMPLANT
ELECT CAUTERY BLADE 6.4 (BLADE) ×3 IMPLANT
ELECT REM PT RETURN 9FT ADLT (ELECTROSURGICAL) ×3
ELECTRODE BLDE 4.0 EZ CLN MEGD (MISCELLANEOUS) IMPLANT
ELECTRODE REM PT RTRN 9FT ADLT (ELECTROSURGICAL) ×1 IMPLANT
GAUZE 4X4 16PLY RFD (DISPOSABLE) IMPLANT
GAUZE SPONGE 4X4 12PLY STRL (GAUZE/BANDAGES/DRESSINGS) ×1 IMPLANT
GEL ULTRASOUND 20GR AQUASONIC (MISCELLANEOUS) ×3 IMPLANT
GLOVE BIO SURGEON STRL SZ 6 (GLOVE) ×6 IMPLANT
GLOVE INDICATOR 6.5 STRL GRN (GLOVE) ×6 IMPLANT
GOWN STRL REUS W/ TWL LRG LVL3 (GOWN DISPOSABLE) ×2 IMPLANT
GOWN STRL REUS W/TWL 2XL LVL3 (GOWN DISPOSABLE) ×6 IMPLANT
GOWN STRL REUS W/TWL LRG LVL3 (GOWN DISPOSABLE) ×12
HANDLE SUCTION POOLE (INSTRUMENTS) ×1 IMPLANT
HEMOSTAT SURGICEL 2X14 (HEMOSTASIS) IMPLANT
KIT BASIN OR (CUSTOM PROCEDURE TRAY) ×3 IMPLANT
KIT MARKER MARGIN INK (KITS) ×3 IMPLANT
KIT TUBE JEJUNAL 16FR (CATHETERS) ×1 IMPLANT
KIT TURNOVER KIT B (KITS) ×3 IMPLANT
LOOP VESSEL MAXI BLUE (MISCELLANEOUS) ×3 IMPLANT
LOOP VESSEL MINI RED (MISCELLANEOUS) ×3 IMPLANT
NEEDLE 22X1 1/2 (OR ONLY) (NEEDLE) ×3 IMPLANT
NS IRRIG 1000ML POUR BTL (IV SOLUTION) ×8 IMPLANT
PACK GENERAL/GYN (CUSTOM PROCEDURE TRAY) ×3 IMPLANT
PAD ARMBOARD 7.5X6 YLW CONV (MISCELLANEOUS) ×6 IMPLANT
PAD DRESSING TELFA 3X8 NADH (GAUZE/BANDAGES/DRESSINGS) IMPLANT
PENCIL SMOKE EVACUATOR (MISCELLANEOUS) ×3 IMPLANT
PLUG CATH AND CAP STER (CATHETERS) IMPLANT
RELOAD PROXIMATE 75MM BLUE (ENDOMECHANICALS) ×9 IMPLANT
RELOAD PROXIMATE 75MM GREEN (ENDOMECHANICALS) IMPLANT
RELOAD STAPLE 75 3.8 BLU REG (ENDOMECHANICALS) IMPLANT
RELOAD STAPLE 75 4.5 GRN THCK (ENDOMECHANICALS) IMPLANT
SEPRAFILM PROCEDURAL PACK 3X5 (MISCELLANEOUS) IMPLANT
SET TUBE SMOKE EVAC HIGH FLOW (TUBING) ×2 IMPLANT
SHEARS FOC LG CVD HARMONIC 17C (MISCELLANEOUS) ×3 IMPLANT
SLEEVE SUCTION 125 (MISCELLANEOUS) ×3 IMPLANT
SLEEVE SUCTION CATH 165 (SLEEVE) ×3 IMPLANT
SOL ANTI FOG 6CC (MISCELLANEOUS) IMPLANT
SOLUTION ANTI FOG 6CC (MISCELLANEOUS) ×2
SPECIMEN JAR X LARGE (MISCELLANEOUS) ×3 IMPLANT
SPONGE INTESTINAL PEANUT (DISPOSABLE) IMPLANT
SPONGE LAP 18X18 RF (DISPOSABLE) ×16 IMPLANT
SPONGE SURGIFOAM ABS GEL 100 (HEMOSTASIS) IMPLANT
STAPLER PROXIMATE 75MM BLUE (STAPLE) ×3 IMPLANT
STAPLER VISISTAT 35W (STAPLE) ×3 IMPLANT
SUCTION POOLE HANDLE (INSTRUMENTS) ×3
SUT 5.0 PDS RB-1 (SUTURE) ×10
SUT ETHILON 2 0 FS 18 (SUTURE) ×10 IMPLANT
SUT ETHILON 2 LR (SUTURE) IMPLANT
SUT PDS AB 1 TP1 96 (SUTURE) ×7 IMPLANT
SUT PDS AB 3-0 SH 27 (SUTURE) ×12 IMPLANT
SUT PDS AB 4-0 RB1 27 (SUTURE) ×39 IMPLANT
SUT PDS PLUS AB 5-0 RB-1 (SUTURE) ×5 IMPLANT
SUT PROLENE 3 0 SH 48 (SUTURE) ×8 IMPLANT
SUT PROLENE 4 0 RB 1 (SUTURE) ×6
SUT PROLENE 4-0 RB1 .5 CRCL 36 (SUTURE) ×2 IMPLANT
SUT PROLENE 5 0 RB 1 DA (SUTURE) ×6 IMPLANT
SUT SILK 2 0 SH CR/8 (SUTURE) ×7 IMPLANT
SUT SILK 2 0 TIES 10X30 (SUTURE) ×3 IMPLANT
SUT SILK 3 0 SH CR/8 (SUTURE) ×3 IMPLANT
SUT SILK 3 0 TIES 10X30 (SUTURE) ×3 IMPLANT
SUT VIC AB 2-0 CT1 27 (SUTURE)
SUT VIC AB 2-0 CT1 TAPERPNT 27 (SUTURE) IMPLANT
SUT VIC AB 2-0 SH 18 (SUTURE) ×3 IMPLANT
SUT VIC AB 3-0 SH 18 (SUTURE) ×3 IMPLANT
SUT VIC AB 3-0 SH 27 (SUTURE) ×3
SUT VIC AB 3-0 SH 27X BRD (SUTURE) ×1 IMPLANT
SUT VICRYL AB 2 0 TIES (SUTURE) ×2 IMPLANT
TAPE UMBILICAL 1/8 X36 TWILL (MISCELLANEOUS) IMPLANT
TOWEL GREEN STERILE (TOWEL DISPOSABLE) ×3 IMPLANT
TOWEL GREEN STERILE FF (TOWEL DISPOSABLE) ×3 IMPLANT
TRAY FOLEY MTR SLVR 14FR STAT (SET/KITS/TRAYS/PACK) ×3 IMPLANT
TUBE CONNECTING 12'X1/4 (SUCTIONS) ×1
TUBE CONNECTING 12X1/4 (SUCTIONS) ×2 IMPLANT
TUBE FEEDING 8FR 16IN STR KANG (MISCELLANEOUS) IMPLANT
TUBE FEEDING ENTERAL 5FR 16IN (TUBING) ×2 IMPLANT
TUNNELER SHEATH ON-Q 16GX12 DP (PAIN MANAGEMENT) ×1 IMPLANT
YANKAUER SUCT BULB TIP NO VENT (SUCTIONS) ×3 IMPLANT

## 2019-09-27 NOTE — Op Note (Signed)
PREOPERATIVE DIAGNOSIS: adenocarcinoma of the pancreatic head, cT2N0, s/p neoadjuvant chemotherapy and stereotactic radiation.    POSTOPERATIVE DIAGNOSIS: Same.   PROCEDURES PERFORMED:  Diagnostic laparoscopy  Classic pancreaticoduodenectomy   Placement of pancreatic duct stent  16 Fr jejunostomy Feeding tube  SURGEON: Stark Klein, MD   ASSISTANT: Verita Lamb, MD Judyann Munson, RNFA   ANESTHESIA: General and epidural   FINDINGS: 3-4 cm pancreatic head mass. Firm pancreatic tissue. 12 mm common bile duct. 5 mm pancreatic duct  SPECIMENS:  1. Pancreaticoduodenectomy with gallbladder:   ESTIMATED BLOOD LOSS: 650 mL.   COMPLICATIONS: ABL anemia.    PROCEDURE:   Pt was identified in the holding area and taken to  the operating room, and placed supine on the operating room  table. General anesthesia was induced. The patient's abdomen was  prepped and draped in a sterile fashion, after a Foley catheter was  placed. A time-out was performed according to the surgical safety check  list. When all was correct we continued.   The patient was placed in reverse trendelenburg position and rotated to the right.  The left subcostal margin was anesthetized with local anesthesia.  A 5 mm optiview trocar was placed under direct visualization.  The abdomen was insufflated with carbon dioxide.  The abdomen was examined.  A second port was placed in the upper midline to be able to better visualize the right liver.  No evidence of carcinomatosis was seen.    A midline incision was made from the xiphoid to just above the umbilicus. The subcutaneous tissues were divided with the Bovie cautery. The peritoneum was entered in the center of the abdomen. Digital retraction was then used to elevate the preperitoneal fat, and  this was taken with the cautery as well. Care was taken to protect the underlying viscera.   The Bookwalter self-retaining retractor was placed to assist with visualization. The  right colon was taken down off of the white line  of Toldt and from the retroperitoneum at the hepatic flexure. The porta was identified. The  duodenum was kocherized extensively with blunt dissection and with cautery. The gallbladder was taken off the liver with a combination of blunt dissection and cautery. The cystic duct was clipped with the Hemalock clips. The cystic duct was divided and the gallbladder was passed off.   The common bile duct was skeletonized near the duodenum. A vessel loop was passed around it. The gastroduodenal artery, as well as the common hepatic artery were skeletonized. The proper hepatic artery was traced out to make sure that flow was going to both sides of the liver when the GDA was clamped. The GDA was test clamped with the bulldog, with good flow to the liver and no signs of ischemia. This was divided with 2-0 silk ties and then clipped. The proper hepatic artery was reflected upward, and the anterior portal vein was exposed. A Kelly clamp was passed partially underneath the pancreas at the superior mesenteric vein.  Attention was then directed to the stomach, and the omentum was taken  off of the stomach at the border of the antrum and the body. The  gastrohepatic ligament was taken down with the harmonic, and care was  taken to make sure there was not a replaced left hepatic artery in this  location. The stomach was divided with the GIA-75 stapler. The border  of the stomach was oversewn with a 3-0 running PDS suture.   Attention was then directed to the small bowel. Around 10 cm  past the  ligament of Treitz was located, and the jejunum was divided with the 75-GIA.  The distal portion of the jejunum was also oversewn with a 3-0 PDS  suture. The fourth portion of the duodenum was skeletonized with the  harmonic scalpel, taking down all of the mesenteric vessels. The  ligament of Treitz was taken down. The IMV was preserved.  The duodenum was then passed  underneath the portal vein.   At this point the Claiborne Billings was replaced and the pancreas was divided with the cautery. 2-0 silk sutures were tied down and the inferior and superior border of the pancreas. The Bovie was used to coagulate the small bleeders at the border of the pancreas.  The Overholt in combination with the harmonic and locking Weck clips  were then used to take the uncinate process off of the portal vein and  the superior mesenteric artery. Care was taken not to incorporate the  superior mesenteric artery in the dissection.  Just after the very last clip was placed, the portal vein started bleeding significantly.  This was repaired with several 4-0 prolene sutures. around 500 mL of blood was lost rapidly, but was repaired.  Pressure was held for around 5 minutes as well while anesthesia caught up. No additional bleeding was seen. The specimen was then marked and passed off the table for frozen section margin.   The jejunum was then passed underneath  the SMV in order to get appropriate lie for the pancreatic and biliary  anastomoses. At this point the frozens returned back as all negative. The patient required pressors and HCT was found to be around 15 g/dL at that time.    The appropriate location for the choledochojejunostomy was identified, and  the small bowel was opened approximately 14 mm. The anastamosis was created with approximately thirteen 4-0 interrupted PDS sutures.   The 2 corner sutures were placed first  and then the posterior layer was done in an interrupted fashion tying on  the inside. The superior layer was then closed with interrupted sutures as  well.   The pancreatic anastomosis was then created by opening the jejunum the length  of the pancreatic parenchyma. The pancreas was firm, and the duct was 5 mm. A pediatric feeding tube was used as a pancreatic stent. The posterior layer was formed first with 2-0 silk sutures in interrupted fashion. Five 5-0 PDS sutures  were used to create a duct-to-mucosa anastamosis.   The anterior layer was then oversewn with 2-0 silks to dunk the pancreatic parenchyma.   The more distal portion of the jejunum was pulled up over  the colon, and two 3-0 silks were placed through the posterior border  of the stomach for the gastrojejunostomy. The stomach and the small  bowel were opened, and a GIA-75 was used to create an end-to-end  anastomosis. The open areas of the staple line were examined to ensure  that there was hemostasis. The defect was then closed with a single  layer of running Connell suture of 3-0 PDS. Prior to a complete  closure, the NG tube was passed toward the afferent limb.   Approximately 30 cm beyond the gastrojejunostomy was identified. A 3-0 silk pursestring suture was placed in the small intestine on the antimesenteric border. An appropriate location for a J-tube is located in the left upper abdomen. A incision was made in the tonsil used to pull the J-tube through the abdominal wall. The jejunum was opened and the tube was threaded  distally in the jejunum.    Witzel sutures were placed proximally on the jejunum.  The tube was then pexed to the abdominal wall with 2-0 silk sutures.  The tube was flushed with sterile water.    The areas were then irrigated and then those anastomoses were covered  with Vistaseal.  This was allowed to dry. The abdomen was then irrigated  again and all the laparotomy sponges were removed. A lap count was  performed, which was correct. Two 19-Blake drains were placed, with the  lateral-most drain placed behind the choledochojejunostomy. The medial  Blake drain was placed just anterior and slightly superior to the  pancreaticojejunostomy.   The fascia was then closed with #1 looped running PDS sutures. The skin was irrigated and then closed with staples. The wounds were cleaned, dried and dressed with a sterile  dressing.   The patient tolerated the procedure well and  was extubated and taken to  PACU in stable condition. Needle and sponge counts were correct x2.

## 2019-09-27 NOTE — Progress Notes (Signed)
NAME:  Julie Rollins, MRN:  [id number], DOB:  19-Feb-1946, LOS: 0 ADMISSION DATE:  10/17/2019, CONSULTATION DATE: 10/24/2019 REFERRING MD: Barry Dienes - CCS, CHIEF COMPLAINT: Hypotension following Whipple  Brief History   A 73 year old woman with known pancreatic cancer for Whipple procedure today to relieve gastric outlet obstruction.  History of present illness   Complex recent history.  Past history of breast cancer status post lumpectomy and radiation hyperthyroidism and hypertension.  Prior cervical decompression.  Presented in May 2020 with painless jaundice.  Underwent ERCP with stenting.  Diagnosed with pancreatic cancer and underwent neoadjuvant chemotherapy.  Poorly tolerated due to persistent nausea and malnutrition. Developed duodenal obstruction due to the cancer and required initiation of TPN. Antineoplastic regimen changed and was planned for elective Whipple procedure to relieve obstruction.  Admitted at [facility] 10/16 with fever, found to have MRSE bacteremia.  Port-A-Cath removed, TEE negative and blood sterilized.  Discharged on home antibiotics.  Presented today for scheduled surgery.  Underwent successful pancreaticoduodenectomy with cholecystectomy with removal of a 4 cm pancreatic head mass.  No surgical complications.  Minimal blood loss estimated at 650 mL.  Patient developed hypotension soon after induction of anesthesia and was on phenylephrine for most of the case before being transitioned to norepinephrine towards the end. She received a total of 2700 mL of lactated Ringer's and 1250 mL of albumin. Initial hemoglobin was 11.3 on 10/29, it dropped to a nadir of 5.4 intraoperatively. 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 Mild coagulopathy was corrected.   Past Medical History   Past Medical History:  Diagnosis Date  . Anxiety   . Blood transfusion without reported diagnosis    patient refuses bld products -albumin ok - refusal  consent on patient's chart  . Breast CA ([place])    s/p lumpectomy and radiation 2000  . Breast cancer ([place])   . Colitis    2007  . Dyspnea   . Dysrhythmia   . Family history of bladder cancer   . Family history of stomach cancer   . Family history of throat cancer   . Hernia, epigastric   . HTN (hypertension)    "mild hypertension", was on BP med years ago but it caused orthostatic hypotension and she has not been medicated since  . Hypothyroid   . Pancreatic cancer ([place]) dx'd 03/2019  . Personal history of breast cancer   . Personal history of radiation therapy 2001  . Poor appetite 04/2019  . Vertigo   . Vertigo    Past Surgical History:  Procedure Laterality Date  . ABDOMINAL HYSTERECTOMY     60yrs ago  . ANTERIOR CERVICAL DECOMP/DISCECTOMY FUSION N/A 08/02/2018   Procedure: ANTERIOR CERVICAL DECOMPRESSION/DISCECTOMY FUSION CERVICAL FIVE- CERVICAL SIX;  Surgeon: Earnie Larsson, MD;  Location: [place];  Service: Neurosurgery;  Laterality: N/A;  ANTERIOR CERVICAL DECOMPRESSION/DISCECTOMY FUSION CERVICAL FIVE- CERVICAL SIX  . BILIARY BRUSHING  03/29/2019   Procedure: BILIARY BRUSHING;  Surgeon: [name], MD;  Location: [facility] ENDOSCOPY;  Service: Endoscopy;;  . BILIARY STENT PLACEMENT  03/29/2019   Procedure: BILIARY STENT PLACEMENT;  Surgeon: [name], MD;  Location: [facility] ENDOSCOPY;  Service: Endoscopy;;  . BIOPSY  03/29/2019   Procedure: BIOPSY;  Surgeon: [name], MD;  Location: [facility] ENDOSCOPY;  Service: Endoscopy;;  . BREAST LUMPECTOMY     2000  . ERCP N/A 03/29/2019   Procedure: ENDOSCOPIC RETROGRADE CHOLANGIOPANCREATOGRAPHY (ERCP);  Surgeon: [name], MD;  Location: [facility]  ENDOSCOPY;  Service: Endoscopy;  Laterality: N/A;  . ESOPHAGOGASTRODUODENOSCOPY N/A 06/17/2019   Procedure: ESOPHAGOGASTRODUODENOSCOPY (EGD);  Surgeon: Gatha Mayer, MD;  Location: [facility] ENDOSCOPY;  Service: Gastroenterology;  Laterality: N/A;  . ESOPHAGOGASTRODUODENOSCOPY (EGD) WITH PROPOFOL N/A  03/29/2019   Procedure: ESOPHAGOGASTRODUODENOSCOPY (EGD) WITH PROPOFOL;  Surgeon: [name], MD;  Location: [facility] ENDOSCOPY;  Service: Endoscopy;  Laterality: N/A;  . ESOPHAGOGASTRODUODENOSCOPY (EGD) WITH PROPOFOL N/A 06/09/2019   Procedure: ESOPHAGOGASTRODUODENOSCOPY (EGD) WITH PROPOFOL;  Surgeon: [name], MD;  Location: [facility] ENDOSCOPY;  Service: Endoscopy;  Laterality: N/A;  . EUS N/A 03/29/2019   Procedure: UPPER ENDOSCOPIC ULTRASOUND (EUS) LINEAR;  Surgeon: [name], MD;  Location: [facility] ENDOSCOPY;  Service: Endoscopy;  Laterality: N/A;  . EUS N/A 06/09/2019   Procedure: UPPER ENDOSCOPIC ULTRASOUND (EUS) LINEAR;  Surgeon: [name], MD;  Location: [facility] ENDOSCOPY;  Service: Endoscopy;  Laterality: N/A;  . FIDUCIAL MARKER PLACEMENT  06/09/2019   Procedure: FIDUCIAL MARKER PLACEMENT;  Surgeon: [name], MD;  Location: [facility] ENDOSCOPY;  Service: Endoscopy;;  . FINE NEEDLE ASPIRATION  03/29/2019   Procedure: FINE NEEDLE ASPIRATION (FNA);  Surgeon: [name], MD;  Location: [facility] ENDOSCOPY;  Service: Endoscopy;;  . HERNIA REPAIR     20+ years ago  . IR CV LINE INJECTION  08/23/2019  . PORT-A-CATH REMOVAL N/A 09/13/2019   Procedure: REMOVAL PORT-A-CATH;  Surgeon: Stark Klein, MD;  Location: [facility] ORS;  Service: General;  Laterality: N/A;  . PORTACATH PLACEMENT N/A 04/12/2019   Procedure: INSERTION PORT-A-CATH LEFT SUBCLAVIAN;  Surgeon: Stark Klein, MD;  Location: [place];  Service: General;  Laterality: N/A;  . RETINAL DETACHMENT SURGERY    . SPHINCTEROTOMY  03/29/2019   Procedure: SPHINCTEROTOMY;  Surgeon: [name], MD;  Location: [facility] ENDOSCOPY;  Service: Endoscopy;;    Significant Hospital Events   11/3-pancreaticoduodenectomy under general and epidural anesthesia  Consults:  PCCM  Procedures:  11/3-double-lumen central line insertion. 11/3-arterial line insertion  Significant Diagnostic Tests:  11/3-chest x-ray lines in appropriate  position, lung fields clear.  Micro Data:  none  Antimicrobials:  Perioperative cefazolin  Interim history/subjective:  Patient arrived in ICU extubated. She is awake and conversant.  She complains principally of a dry mouth.  She states that her abdomen is sore.  Her back is uncomfortable from lying in bed.  Objective   Blood pressure (!) 112/57, pulse (!) 113, temperature (!) 97 F (36.1 C), resp. rate 17, height 5\' 4"  (1.626 m), weight 59.8 kg, SpO2 100 %.        Intake/Output Summary (Last 24 hours) at 10/11/2019 1557 Last data filed at 10/14/2019 1500 Gross per 24 hour  Intake 4125 ml  Output 850 ml  Net 3275 ml   Filed Weights   10/24/2019 0611  Weight: 59.8 kg    Examination: General: Appears cachectic.  Generalized pallor.  In mild pain.  No respiratory distress. HENT: Dry mucous membranes.  Sclera are pale. Lungs: Chest is clear to auscultation bilaterally.  No respiratory distress. Cardiovascular: Capillary refill is 3 seconds.  Heart sounds are unremarkable.  There is no peripheral edema.  Peripheral pulses are intact. Abdomen: Distended abdomen with midline incision.  2 drains in the right upper quadrant, PEG tube in left upper quadrant. Extremities: No edema.  Marked muscle wasting.  Right IJdouble-lumen Neuro: Awake and following commands.  No focal deficits GU: Foley catheter in place producing clear urine.  Resolved Hospital Problem list   Not applicable  Assessment &  Plan:  Critically ill due to hypotension requiring titration of norepinephrine to maintain adequate MAP greater than 65. Hypotension likely due to effects of general anesthesia and vasodilatation from epidural anesthesia in malnourished patient with impaired vascular tone. Epidural catheter in place but no infusion. -Leave epidural off for now.  If pain intractable use narcotic only epidural, avoid local anesthetic. -Judicious pain control with multimodal analgesia to minimize narcotic use.  -Continue to titrate norepinephrine -Follow lactate clearance.  Currently mildly elevated but clinical perfusion has improved. -CVP to direct further fluid resuscitation.  Postoperative anemia. Suspect delusional effects as hemoglobin of 11 may have been factitiously elevated due to hemoconcentration from decreased oral intake. Intravascular volume contraction could not also account for hypotension encountered on induction of anesthesia. -Careful fluid resuscitation to avoid further hemodilution. -Initial intraoperative resuscitation of over 4 L should be adequate given minimal blood loss and drain output. -Check vitamin B12, folate, iron stores -Empiric B12, folate, iron repletion -We will start Aranesp to stimulate erythropoiesis.   Best practice:  Diet: N.p.o. for now.  Will initiate feeds via PEG tube per general surgery Pain/Anxiety/Delirium protocol (if indicated): Multimodal analgesia VAP protocol (if indicated): Not indicated DVT prophylaxis: Unfractionated heparin GI prophylaxis: Not indicated Glucose control: Sliding scale insulin phase 1 protocol Mobility: Up to chair once off vasopressors Code Status: Full Family Communication: Patient has been updated. Disposition: ICU  Labs   CBC: Recent Labs  Lab 09/22/19 1100 10/24/2019 1121 10/14/2019 1154 09/26/2019 1202  WBC 5.2  --   --   --   NEUTROABS 4.0  --   --   --   HGB 11.3* 9.2* 5.4* 5.4*  HCT 35.7* 27.0* 16.0* 16.0*  MCV 103.2*  --   --   --   PLT 374  --   --   --     Basic Metabolic Panel: Recent Labs  Lab 09/22/19 1100 09/30/2019 1121 09/30/2019 1154 10/12/2019 1202  NA 139 139 140 140  K 3.9 3.5 3.2* 3.2*  CL 105  --  106  --   CO2 23  --   --   --   GLUCOSE 181*  --  242*  --   BUN 14  --  9  --   CREATININE 0.71  --  0.50  --   CALCIUM 9.4  --   --   --    GFR: Estimated Creatinine Clearance: 52.5 mL/min (by C-G formula based on SCr of 0.5 mg/dL). Recent Labs  Lab 09/22/19 1100  WBC 5.2     Liver Function Tests: Recent Labs  Lab 09/22/19 1100  AST 21  ALT 21  ALKPHOS 101  BILITOT 0.3  PROT 7.0  ALBUMIN 3.6   Recent Labs  Lab 09/22/19 1100  LIPASE 37   No results for input(s): AMMONIA in the last 168 hours.  ABG    Component Value Date/Time   PHART 7.331 (L) 10/21/2019 1202   PCO2ART 40.4 10/17/2019 1202   PO2ART 455.0 (H) 10/14/2019 1202   HCO3 21.4 10/24/2019 1202   TCO2 23 10/23/2019 1202   ACIDBASEDEF 4.0 (H) 10/08/2019 1202   O2SAT 100.0 10/22/2019 1202     Coagulation Profile: Recent Labs  Lab 09/22/19 1100 10/22/2019 1217  INR 1.1 1.5*    Cardiac Enzymes: No results for input(s): CKTOTAL, CKMB, CKMBINDEX, TROPONINI in the last 168 hours.  HbA1C: Hgb A1c MFr Bld  Date/Time Value Ref Range Status  09/22/2019 10:56 AM 5.7 (H) 4.8 - 5.6 % Final  Comment:    (NOTE) Pre diabetes:          5.7%-6.4% Diabetes:              >6.4% Glycemic control for   <7.0% adults with diabetes   03/28/2019 08:03 AM 4.8 4.8 - 5.6 % Final    Comment:    (NOTE) Pre diabetes:          5.7%-6.4% Diabetes:              >6.4% Glycemic control for   <7.0% adults with diabetes     CBG: Recent Labs  Lab 09/22/19 0956 10/09/2019 1458 10/17/2019 1542  GLUCAP 166* 272* 282*   Lactic Acid, Venous    Component Value Date/Time   LATICACIDVEN 3.2 (HH) 10/04/2019 1534     CRITICAL CARE Performed by: Kipp Brood  Total critical care time: 60 minutes  Critical care time was exclusive of separately billable procedures and treating other patients.  Critical care was necessary to treat or prevent imminent or life-threatening deterioration.  Critical care was time spent personally by me on the following activities: development of treatment plan with patient and/or surrogate as well as nursing, discussions with consultants, evaluation of patient's response to treatment, examination of patient, obtaining history from patient or surrogate, ordering and performing  treatments and interv60iographic studies, pulse oximetry, re-evaluation of patient's condition and participation in multidisciplinary rounds.  Kipp Brood, MD [facility] ICU Physician [place]  Pager: [phone] Mobile: [phone] After hours: [phone].

## 2019-09-27 NOTE — Transfer of Care (Signed)
Immediate Anesthesia Transfer of Care Note  Patient: Julie Rollins  Procedure(s) Performed: WHIPPLE PROCEDURE (N/A Abdomen) GASTROJEJUNOSTOMY, J TUBE (N/A Abdomen) Laparoscopy Diagnostic (N/A Abdomen)  Patient Location: PACU  Anesthesia Type:General  Level of Consciousness: awake and patient cooperative  Airway & Oxygen Therapy: Patient Spontanous Breathing and Patient connected to face mask oxygen  Post-op Assessment: Report given to RN and Post -op Vital signs reviewed and stable  Post vital signs: Reviewed and stable  Last Vitals:  Vitals Value Taken Time  BP 107/83 10/12/2019 1453  Temp    Pulse 106 09/30/2019 1457  Resp 20 10/11/2019 1457  SpO2 100 % 10/01/2019 1457  Vitals shown include unvalidated device data.  Last Pain:  Vitals:   10/24/2019 0611  TempSrc: Oral  PainSc: 0-No pain      Patients Stated Pain Goal: 2 ([id number])  Complications: No apparent anesthesia complications

## 2019-09-27 NOTE — Anesthesia Procedure Notes (Signed)
Epidural Patient location during procedure: pre-op Start time: 10/06/2019 7:15 AM End time: 10/23/2019 7:25 AM  Staffing Anesthesiologist: Pervis Hocking, DO Performed: anesthesiologist   Preanesthetic Checklist Completed: patient identified, pre-op evaluation, timeout performed, IV checked, risks and benefits discussed and monitors and equipment checked  Epidural Patient position: sitting Prep: site prepped and draped and DuraPrep Patient monitoring: continuous pulse ox, blood pressure, heart rate and cardiac monitor Approach: midline Location: thoracic (1-12) (T9-10) Injection technique: LOR air  Needle:  Needle type: Tuohy  Needle gauge: 17 G Needle length: 9 cm Needle insertion depth: 4 cm Catheter type: closed end flexible Catheter size: 19 Gauge Catheter at skin depth: 9 cm Test dose: negative  Assessment Sensory level: T6 Events: blood not aspirated, injection not painful, no injection resistance, negative IV test and no paresthesia  Additional Notes Patient identified. Risks/Benefits/Options discussed with patient including but not limited to bleeding, infection, nerve damage, paralysis, failed block, incomplete pain control, headache, blood pressure changes, nausea, vomiting, reactions to medication both or allergic, itching and postpartum back pain. Confirmed with bedside nurse the patient's most recent platelet count. Confirmed with patient that they are not currently taking any anticoagulation, have any bleeding history or any family history of bleeding disorders. Patient expressed understanding and wished to proceed. All questions were answered. Sterile technique was used throughout the entire procedure. Please see nursing notes for vital signs. Test dose was given through epidural catheter and negative prior to continuing to dose epidural or start infusion. Warning signs of high block given to the patient including shortness of breath, tingling/numbness in hands,  complete motor block, or any concerning symptoms with instructions to call for help. Patient was given instructions on fall risk and not to get out of bed. All questions and concerns addressed with instructions to call with any issues or inadequate analgesia.  Reason for block:procedure for pain

## 2019-09-27 NOTE — Anesthesia Procedure Notes (Signed)
Procedure Name: Intubation Date/Time: 10/02/2019 7:47 AM Performed by: Kathryne Hitch, CRNA Pre-anesthesia Checklist: Patient identified, Emergency Drugs available, Suction available and Patient being monitored Patient Re-evaluated:Patient Re-evaluated prior to induction Oxygen Delivery Method: Circle system utilized Preoxygenation: Pre-oxygenation with 100% oxygen Induction Type: IV induction and Rapid sequence Laryngoscope Size: Miller and 3 Grade View: Grade I Tube type: Oral Tube size: 7.0 mm Number of attempts: 1 Airway Equipment and Method: Stylet Placement Confirmation: ETT inserted through vocal cords under direct vision,  positive ETCO2 and breath sounds checked- equal and bilateral Secured at: 21 cm Tube secured with: Tape Dental Injury: Teeth and Oropharynx as per pre-operative assessment

## 2019-09-27 NOTE — Anesthesia Procedure Notes (Signed)
Arterial Line Insertion Start/End11/28/2020 8:00 AM, 09/27/2019 8:10 AM Performed by: Pervis Hocking, DO, anesthesiologist  Patient location: OR. Preanesthetic checklist: patient identified, IV checked, site marked, risks and benefits discussed, surgical consent, monitors and equipment checked, pre-op evaluation, timeout performed and anesthesia consent Lidocaine 1% used for infiltration radial was placed Catheter size: 20 Fr Hand hygiene performed  and maximum sterile barriers used  Allen's test indicative of satisfactory collateral circulation Attempts: 5 or more (multiple attempts by multiple providers B/L) Procedure performed using ultrasound guided technique. Ultrasound Notes:anatomy identified, needle tip was noted to be adjacent to the nerve/plexus identified and no ultrasound evidence of intravascular and/or intraneural injection Following insertion, dressing applied. Post procedure assessment: normal and unchanged  Patient tolerated the procedure well with no immediate complications.

## 2019-09-27 NOTE — Progress Notes (Signed)
Pt sent postoperatively to ICU s/p whipple with extensive blood loss. Unable to resuscitate appropriately given religious preferences. Epidural has been off since large blood loss and subsequent hypotension. I will leave epidural catheter in place overnight while patient remains anemic and on pressors. Please check coags tonight or tomorrow, and we will pull epidural tomorrow if safe to do so.

## 2019-09-27 NOTE — Anesthesia Postprocedure Evaluation (Signed)
Anesthesia Post Note  Patient: [name]  Procedure(s) Performed: WHIPPLE PROCEDURE (N/A Abdomen) GASTROJEJUNOSTOMY, J TUBE (N/A Abdomen) Laparoscopy Diagnostic (N/A Abdomen)     Patient location during evaluation: PACU Anesthesia Type: Epidural and General Level of consciousness: awake and alert, oriented and patient cooperative Pain management: pain level controlled Vital Signs Assessment: post-procedure vital signs reviewed and stable Respiratory status: spontaneous breathing, nonlabored ventilation and respiratory function stable Cardiovascular status: tachycardic and unstable Postop Assessment: no apparent nausea or vomiting Anesthetic complications: no Comments: ICU admission for large intraoperative blood loss, Jehovah's witness refusing blood. Last Hb<5, tachycardic and hypotensive on escalating amounts of pressors. Extensive discussion preoperatively regarding the likely need for blood transfusion and patient adamantly refused. Epidural infusion off for now due to hypotension, RIJ central line placed prior to extubation to allow for infusion of pressors.     Last Vitals:  Vitals:   09/29/2019 1453 10/22/2019 1500  BP: 107/83   Pulse: (!) 111 (!) 106  Resp: (!) 21 17  Temp: (!) 36.1 C   SpO2: 96% 100%    Last Pain:  Vitals:   10/21/2019 1453  TempSrc:   PainSc: 0-No pain                 Julie Rollins

## 2019-09-27 NOTE — Anesthesia Procedure Notes (Signed)
Central Venous Catheter Insertion Performed by: Pervis Hocking, DO, anesthesiologist Start/End11/23/2020 2:15 PM, 09/27/2019 2:20 PM Patient location: OR. Preanesthetic checklist: patient identified, IV checked, site marked, risks and benefits discussed, surgical consent, monitors and equipment checked, pre-op evaluation, timeout performed and anesthesia consent Position: supine Lidocaine 1% used for infiltration and patient sedated Hand hygiene performed  and maximum sterile barriers used  Catheter size: 8 Fr Total catheter length 16. Central line was placed.Double lumen Procedure performed using ultrasound guided technique. Ultrasound Notes:image(s) printed for medical record Attempts: 1 Following insertion, dressing applied and line sutured. Post procedure assessment: blood return through all ports  Patient tolerated the procedure well with no immediate complications.

## 2019-09-27 NOTE — Interval H&P Note (Signed)
History and Physical Interval Note:  10/05/2019 7:33 AM  Julie Rollins  has presented today for surgery, with the diagnosis of pancreatic cancer.  The various methods of treatment have been discussed with the patient and family. After consideration of risks, benefits and other options for treatment, the patient has consented to  Procedure(s) with comments: WHIPPLE PROCEDURE (N/A) vs GASTROJEJUNOSTOMY, J TUBE (N/A) - GENERAL AND EPIDURAL as a surgical intervention. Pt has been eating well at home and completed IV antibiotics.  She is ready for surgery.   The patient's history has been reviewed, patient examined, no change in status, stable for surgery.  I have reviewed the patient's chart and labs.  Questions were answered to the patient's satisfaction.     Stark Klein

## 2019-09-28 ENCOUNTER — Encounter (HOSPITAL_COMMUNITY): Payer: Self-pay | Admitting: Anesthesiology

## 2019-09-28 ENCOUNTER — Encounter (HOSPITAL_COMMUNITY): Payer: Self-pay | Admitting: General Surgery

## 2019-09-28 DIAGNOSIS — C250 Malignant neoplasm of head of pancreas: Principal | ICD-10-CM

## 2019-09-28 LAB — GLUCOSE, CAPILLARY
Glucose-Capillary: 114 mg/dL — ABNORMAL HIGH (ref 70–99)
Glucose-Capillary: 116 mg/dL — ABNORMAL HIGH (ref 70–99)
Glucose-Capillary: 132 mg/dL — ABNORMAL HIGH (ref 70–99)
Glucose-Capillary: 137 mg/dL — ABNORMAL HIGH (ref 70–99)
Glucose-Capillary: 139 mg/dL — ABNORMAL HIGH (ref 70–99)
Glucose-Capillary: 144 mg/dL — ABNORMAL HIGH (ref 70–99)
Glucose-Capillary: 145 mg/dL — ABNORMAL HIGH (ref 70–99)
Glucose-Capillary: 167 mg/dL — ABNORMAL HIGH (ref 70–99)
Glucose-Capillary: 177 mg/dL — ABNORMAL HIGH (ref 70–99)
Glucose-Capillary: 194 mg/dL — ABNORMAL HIGH (ref 70–99)
Glucose-Capillary: 63 mg/dL — ABNORMAL LOW (ref 70–99)
Glucose-Capillary: 81 mg/dL (ref 70–99)
Glucose-Capillary: 88 mg/dL (ref 70–99)
Glucose-Capillary: 90 mg/dL (ref 70–99)
Glucose-Capillary: 91 mg/dL (ref 70–99)

## 2019-09-28 LAB — COMPREHENSIVE METABOLIC PANEL
ALT: 21 U/L (ref 0–44)
AST: 35 U/L (ref 15–41)
Albumin: 4.1 g/dL (ref 3.5–5.0)
Alkaline Phosphatase: 49 U/L (ref 38–126)
Anion gap: 9 (ref 5–15)
BUN: 9 mg/dL (ref 8–23)
CO2: 21 mmol/L — ABNORMAL LOW (ref 22–32)
Calcium: 8.3 mg/dL — ABNORMAL LOW (ref 8.9–10.3)
Chloride: 111 mmol/L (ref 98–111)
Creatinine, Ser: 0.68 mg/dL (ref 0.44–1.00)
GFR calc Af Amer: >60 mL/min (ref >60)
GFR calc non Af Amer: >60 mL/min (ref >60)
Glucose, Bld: 118 mg/dL — ABNORMAL HIGH (ref 70–99)
Potassium: 3.2 mmol/L — ABNORMAL LOW (ref 3.5–5.1)
Sodium: 141 mmol/L (ref 135–145)
Total Bilirubin: 0.3 mg/dL (ref 0.3–1.2)
Total Protein: 5.6 g/dL — ABNORMAL LOW (ref 6.5–8.1)

## 2019-09-28 LAB — CBC
HCT: 14.1 % — ABNORMAL LOW (ref 36.0–46.0)
Hemoglobin: 4.5 g/dL — CL (ref 12.0–15.0)
MCH: 32.6 pg (ref 26.0–34.0)
MCHC: 31.9 g/dL (ref 30.0–36.0)
MCV: 102.2 fL — ABNORMAL HIGH (ref 80.0–100.0)
Platelets: 161 10*3/uL (ref 150–400)
RBC: 1.38 MIL/uL — ABNORMAL LOW (ref 3.87–5.11)
RDW: 15.4 % (ref 11.5–15.5)
WBC: 12.5 10*3/uL — ABNORMAL HIGH (ref 4.0–10.5)
nRBC: 0 % (ref 0.0–0.2)

## 2019-09-28 LAB — MAGNESIUM: Magnesium: 1.7 mg/dL (ref 1.7–2.4)

## 2019-09-28 LAB — FOLATE RBC
Folate, Hemolysate: >620 ng/mL
Folate, RBC: >4276 ng/mL (ref >498)
Hematocrit: 14.5 % — CL (ref 34.0–46.6)

## 2019-09-28 LAB — PROTIME-INR
INR: 1.5 — ABNORMAL HIGH (ref 0.8–1.2)
Prothrombin Time: 18.1 seconds — ABNORMAL HIGH (ref 11.4–15.2)

## 2019-09-28 LAB — BPAM CRYOPRECIPITATE
Blood Product Expiration Date: 202011031840
ISSUE DATE / TIME: 202011031300
Unit Type and Rh: 6200

## 2019-09-28 LAB — PREPARE CRYOPRECIPITATE: Unit division: 0

## 2019-09-28 LAB — PHOSPHORUS: Phosphorus: 1.9 mg/dL — ABNORMAL LOW (ref 2.5–4.6)

## 2019-09-28 MED ORDER — INSULIN DETEMIR 100 UNIT/ML ~~LOC~~ SOLN
5.0000 [IU] | Freq: Two times a day (BID) | SUBCUTANEOUS | Status: DC
  Filled 2019-09-28 (×2): qty 0.05

## 2019-09-28 MED ORDER — MAGNESIUM SULFATE 2 GM/50ML IV SOLN
2.0000 g | Freq: Once | INTRAVENOUS | Status: AC
  Administered 2019-09-28: 2 g via INTRAVENOUS
  Filled 2019-09-28: qty 50

## 2019-09-28 MED ORDER — NALOXONE HCL 0.4 MG/ML IJ SOLN: 0.4000 mg | INTRAMUSCULAR | Status: DC | PRN

## 2019-09-28 MED ORDER — SODIUM CHLORIDE 0.9 % IV BOLUS
100.0000 mg | INTRAVENOUS | Status: AC
  Administered 2019-09-28 – 2019-10-02 (×5): 100 mg via INTRAVENOUS
  Filled 2019-09-28 (×5): qty 2

## 2019-09-28 MED ORDER — WHITE PETROLATUM EX OINT
TOPICAL_OINTMENT | CUTANEOUS | Status: AC
  Filled 2019-09-28: qty 28.35

## 2019-09-28 MED ORDER — INSULIN DETEMIR 100 UNIT/ML ~~LOC~~ SOLN
5.0000 [IU] | Freq: Two times a day (BID) | SUBCUTANEOUS | Status: DC
  Administered 2019-09-28 – 2019-11-09 (×72): 5 [IU] via SUBCUTANEOUS
  Filled 2019-09-28 (×88): qty 0.05

## 2019-09-28 MED ORDER — INSULIN ASPART 100 UNIT/ML ~~LOC~~ SOLN
2.0000 [IU] | SUBCUTANEOUS | Status: DC
  Administered 2019-09-28 – 2019-09-29 (×3): 2 [IU] via SUBCUTANEOUS
  Administered 2019-09-29: 4 [IU] via SUBCUTANEOUS
  Administered 2019-09-29 – 2019-09-30 (×5): 2 [IU] via SUBCUTANEOUS
  Administered 2019-10-01: 4 [IU] via SUBCUTANEOUS
  Administered 2019-10-01 – 2019-10-03 (×7): 2 [IU] via SUBCUTANEOUS
  Administered 2019-10-03: 4 [IU] via SUBCUTANEOUS
  Administered 2019-10-04: 2 [IU] via SUBCUTANEOUS
  Administered 2019-10-04: 4 [IU] via SUBCUTANEOUS
  Administered 2019-10-05 (×2): 2 [IU] via SUBCUTANEOUS
  Administered 2019-10-05: 4 [IU] via SUBCUTANEOUS
  Administered 2019-10-05: 2 [IU] via SUBCUTANEOUS
  Administered 2019-10-05 – 2019-10-06 (×2): 4 [IU] via SUBCUTANEOUS
  Administered 2019-10-06 (×2): 2 [IU] via SUBCUTANEOUS
  Administered 2019-10-06 – 2019-10-08 (×7): 4 [IU] via SUBCUTANEOUS
  Administered 2019-10-08 – 2019-10-09 (×5): 2 [IU] via SUBCUTANEOUS
  Administered 2019-10-10: 4 [IU] via SUBCUTANEOUS
  Administered 2019-10-10: 6 [IU] via SUBCUTANEOUS
  Administered 2019-10-10 (×2): 4 [IU] via SUBCUTANEOUS
  Administered 2019-10-10 – 2019-10-13 (×8): 2 [IU] via SUBCUTANEOUS
  Administered 2019-10-14: 4 [IU] via SUBCUTANEOUS
  Administered 2019-10-15 – 2019-10-30 (×21): 2 [IU] via SUBCUTANEOUS
  Administered 2019-11-01 – 2019-11-02 (×2): 4 [IU] via SUBCUTANEOUS
  Administered 2019-11-02: 2 [IU] via SUBCUTANEOUS
  Administered 2019-11-02: 4 [IU] via SUBCUTANEOUS
  Administered 2019-11-02 – 2019-11-04 (×5): 2 [IU] via SUBCUTANEOUS
  Administered 2019-11-04: 4 [IU] via SUBCUTANEOUS
  Administered 2019-11-04 (×2): 2 [IU] via SUBCUTANEOUS
  Administered 2019-11-05: 4 [IU] via SUBCUTANEOUS
  Administered 2019-11-05 – 2019-11-09 (×11): 2 [IU] via SUBCUTANEOUS

## 2019-09-28 MED ORDER — INSULIN DETEMIR 100 UNIT/ML ~~LOC~~ SOLN
5.0000 [IU] | Freq: Two times a day (BID) | SUBCUTANEOUS | Status: DC
  Administered 2019-09-28: 5 [IU] via SUBCUTANEOUS
  Filled 2019-09-28 (×2): qty 0.05

## 2019-09-28 MED ORDER — HYDROMORPHONE 1 MG/ML IV SOLN
INTRAVENOUS | Status: DC
  Administered 2019-09-28: 30 mg via INTRAVENOUS
  Administered 2019-09-28: 0.6 mg via INTRAVENOUS
  Administered 2019-09-29: 0.3 mg via INTRAVENOUS
  Administered 2019-09-29 (×3): 0.9 mg via INTRAVENOUS
  Administered 2019-09-29: 0.6 mg via INTRAVENOUS
  Administered 2019-09-29: 0.9 mg via INTRAVENOUS
  Administered 2019-09-30: 0 mg via INTRAVENOUS
  Administered 2019-09-30: 0.6 mg via INTRAVENOUS
  Administered 2019-09-30: 0.3 mg via INTRAVENOUS
  Administered 2019-09-30: 1.5 mg via INTRAVENOUS
  Administered 2019-09-30: 1.2 mg via INTRAVENOUS
  Administered 2019-09-30: 0.3 mg via INTRAVENOUS
  Administered 2019-10-01: 1.8 mg via INTRAVENOUS
  Administered 2019-10-02: 11:00:00 via INTRAVENOUS
  Administered 2019-10-02 (×2): 1.2 mg via INTRAVENOUS
  Administered 2019-10-03: 0.3 mg via INTRAVENOUS
  Filled 2019-09-28 (×2): qty 30

## 2019-09-28 MED ORDER — POTASSIUM PHOSPHATES 15 MMOLE/5ML IV SOLN
30.0000 mmol | Freq: Once | INTRAVENOUS | Status: AC
  Administered 2019-09-28: 08:00:00 30 mmol via INTRAVENOUS
  Filled 2019-09-28: qty 10

## 2019-09-28 MED ORDER — IRON DEXTRAN 50 MG/ML IJ SOLN
100.0000 mg | Freq: Every day | INTRAMUSCULAR | Status: DC
  Filled 2019-09-28 (×4): qty 2

## 2019-09-28 MED ORDER — SODIUM CHLORIDE 0.9% FLUSH: 9.0000 mL | INTRAVENOUS | Status: DC | PRN

## 2019-09-28 NOTE — Progress Notes (Signed)
NAME:  Julie Rollins, MRN:  [id number], DOB:  09-28-1946, LOS: 1 ADMISSION DATE:  10/17/2019, CONSULTATION DATE: 10/17/2019 REFERRING MD: Barry Dienes - CCS, CHIEF COMPLAINT: Hypotension following Whipple  Brief History   A 73 year old woman with known pancreatic cancer for Whipple procedure today to relieve gastric outlet obstruction.  History of present illness   Complex recent history.  Past history of breast cancer status post lumpectomy and radiation hyperthyroidism and hypertension.  Prior cervical decompression.  Presented in May 2020 with painless jaundice.  Underwent ERCP with stenting.  Diagnosed with pancreatic cancer and underwent neoadjuvant chemotherapy.  Poorly tolerated due to persistent nausea and malnutrition. Developed duodenal obstruction due to the cancer and required initiation of TPN. Antineoplastic regimen changed and was planned for elective Whipple procedure to relieve obstruction.  Admitted at [facility] 10/16 with fever, found to have MRSE bacteremia.  Port-A-Cath removed, TEE negative and blood sterilized.  Discharged on home antibiotics.  Presented today for scheduled surgery.  Underwent successful pancreaticoduodenectomy with cholecystectomy with removal of a 4 cm pancreatic head mass.  No surgical complications.  Minimal blood loss estimated at 650 mL.  Patient developed hypotension soon after induction of anesthesia and was on phenylephrine for most of the case before being transitioned to norepinephrine towards the end. She received a total of 2700 mL of lactated Ringer's and 1250 mL of albumin. Initial hemoglobin was 11.3 on 10/29, it dropped to a nadir of 5.4 intraoperatively. 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 Mild coagulopathy was corrected.   Past Medical History   Past Medical History:  Diagnosis Date  . Anxiety   . Blood transfusion without reported diagnosis    patient refuses bld products -albumin ok - refusal  consent on patient's chart  . Breast CA ([place])    s/p lumpectomy and radiation 2000  . Breast cancer ([place])   . Colitis    2007  . Dyspnea   . Dysrhythmia   . Family history of bladder cancer   . Family history of stomach cancer   . Family history of throat cancer   . Hernia, epigastric   . HTN (hypertension)    "mild hypertension", was on BP med years ago but it caused orthostatic hypotension and she has not been medicated since  . Hypothyroid   . Pancreatic cancer ([place]) dx'd 03/2019  . Personal history of breast cancer   . Personal history of radiation therapy 2001  . Poor appetite 04/2019  . Vertigo   . Vertigo    Past Surgical History:  Procedure Laterality Date  . ABDOMINAL HYSTERECTOMY     67yrs ago  . ANTERIOR CERVICAL DECOMP/DISCECTOMY FUSION N/A 08/02/2018   Procedure: ANTERIOR CERVICAL DECOMPRESSION/DISCECTOMY FUSION CERVICAL FIVE- CERVICAL SIX;  Surgeon: Earnie Larsson, MD;  Location: [place];  Service: Neurosurgery;  Laterality: N/A;  ANTERIOR CERVICAL DECOMPRESSION/DISCECTOMY FUSION CERVICAL FIVE- CERVICAL SIX  . BILIARY BRUSHING  03/29/2019   Procedure: BILIARY BRUSHING;  Surgeon: [name], MD;  Location: [facility] ENDOSCOPY;  Service: Endoscopy;;  . BILIARY STENT PLACEMENT  03/29/2019   Procedure: BILIARY STENT PLACEMENT;  Surgeon: [name], MD;  Location: [facility] ENDOSCOPY;  Service: Endoscopy;;  . BIOPSY  03/29/2019   Procedure: BIOPSY;  Surgeon: [name], MD;  Location: [facility] ENDOSCOPY;  Service: Endoscopy;;  . BREAST LUMPECTOMY     2000  . ERCP N/A 03/29/2019   Procedure: ENDOSCOPIC RETROGRADE CHOLANGIOPANCREATOGRAPHY (ERCP);  Surgeon: [name], MD;  Location: [facility]  ENDOSCOPY;  Service: Endoscopy;  Laterality: N/A;  . ESOPHAGOGASTRODUODENOSCOPY N/A 06/17/2019   Procedure: ESOPHAGOGASTRODUODENOSCOPY (EGD);  Surgeon: Gatha Mayer, MD;  Location: [facility] ENDOSCOPY;  Service: Gastroenterology;  Laterality: N/A;  . ESOPHAGOGASTRODUODENOSCOPY (EGD) WITH PROPOFOL N/A  03/29/2019   Procedure: ESOPHAGOGASTRODUODENOSCOPY (EGD) WITH PROPOFOL;  Surgeon: [name], MD;  Location: [facility] ENDOSCOPY;  Service: Endoscopy;  Laterality: N/A;  . ESOPHAGOGASTRODUODENOSCOPY (EGD) WITH PROPOFOL N/A 06/09/2019   Procedure: ESOPHAGOGASTRODUODENOSCOPY (EGD) WITH PROPOFOL;  Surgeon: [name], MD;  Location: [facility] ENDOSCOPY;  Service: Endoscopy;  Laterality: N/A;  . EUS N/A 03/29/2019   Procedure: UPPER ENDOSCOPIC ULTRASOUND (EUS) LINEAR;  Surgeon: [name], MD;  Location: [facility] ENDOSCOPY;  Service: Endoscopy;  Laterality: N/A;  . EUS N/A 06/09/2019   Procedure: UPPER ENDOSCOPIC ULTRASOUND (EUS) LINEAR;  Surgeon: [name], MD;  Location: [facility] ENDOSCOPY;  Service: Endoscopy;  Laterality: N/A;  . FIDUCIAL MARKER PLACEMENT  06/09/2019   Procedure: FIDUCIAL MARKER PLACEMENT;  Surgeon: [name], MD;  Location: [facility] ENDOSCOPY;  Service: Endoscopy;;  . FINE NEEDLE ASPIRATION  03/29/2019   Procedure: FINE NEEDLE ASPIRATION (FNA);  Surgeon: [name], MD;  Location: [facility] ENDOSCOPY;  Service: Endoscopy;;  . GASTROJEJUNOSTOMY N/A 10/21/2019   Procedure: Jason Fila;  Surgeon: Stark Klein, MD;  Location: [place];  Service: General;  Laterality: N/A;  GENERAL AND EPIDURAL  . HERNIA REPAIR     20+ years ago  . IR CV LINE INJECTION  08/23/2019  . LAPAROSCOPY N/A 10/03/2019   Procedure: Laparoscopy Diagnostic;  Surgeon: Stark Klein, MD;  Location: [place];  Service: General;  Laterality: N/A;  . PORT-A-CATH REMOVAL N/A 09/13/2019   Procedure: REMOVAL PORT-A-CATH;  Surgeon: Stark Klein, MD;  Location: [facility] ORS;  Service: General;  Laterality: N/A;  . PORTACATH PLACEMENT N/A 04/12/2019   Procedure: INSERTION PORT-A-CATH LEFT SUBCLAVIAN;  Surgeon: Stark Klein, MD;  Location: [place];  Service: General;  Laterality: N/A;  . RETINAL DETACHMENT SURGERY    . SPHINCTEROTOMY  03/29/2019   Procedure: SPHINCTEROTOMY;  Surgeon: [name], MD;   Location: [facility] ENDOSCOPY;  Service: Endoscopy;;  . WHIPPLE PROCEDURE N/A 10/20/2019   Procedure: WHIPPLE PROCEDURE;  Surgeon: Stark Klein, MD;  Location: [place];  Service: General;  Laterality: N/A;  [place] [facility] Events   11/3-pancreaticoduodenectomy under general and epidural anesthesia  Consults:  PCCM  Procedures:  11/3-double-lumen central line insertion. 11/3-arterial line insertion  Significant Diagnostic Tests:  11/3-chest x-ray lines in appropriate position, lung fields clear.  Micro Data:  none  Antimicrobials:  Perioperative cefazolin  Interim history/subjective:  Improved hemodynamics, off pressors Complains of back pain and abdominal pain with dry-heaving; nausea with pain medication  Asks "did they take any of my pancreas out?"   Objective   Blood pressure (!) 124/55, pulse 98, temperature 97.6 F (36.4 C), temperature source Oral, resp. rate 14, height 5\' 4"  (1.626 m), weight 59.8 kg, SpO2 100 %. CVP:  [2 mmHg-7 mmHg] 2 mmHg      Intake/Output Summary (Last 24 hours) at 09/28/2019 0837 Last data filed at 09/28/2019 0800 Gross per 24 hour  Intake 6864.19 ml  Output 2180 ml  Net 4684.19 ml   Filed Weights   09/30/2019 0611  Weight: 59.8 kg    Examination: General: Chronically ill, frail appearing older adult F. Reclined in bed, mild pain HENT: NCAT. Tacky mucus membranes, pink in color. Anicteric sclera  Lungs: CTA, no accessory muscle  use.  Cardiovascular: Sinus tach. s1s2 no rgm 2+ pulses. Abdomen: Tender abdomen with midline incision, 2 RUQ drains with serosanguinous output, L PEG. Dressing c/d/i Extremities: Symmetrical bulk and tone with notable muscle wasting. No obvious joint deformity. No cyanosis or clubbing  Neuro: AAOx4 following commands. Fine tremor Skin: pale, warm, clean, dry, without rash   Resolved Hospital Problem list   Hypotension   Assessment & Plan:   Hypotension, resolved  Hypotension likely  due to effects of general anesthesia and vasodilatation from epidural anesthesia in malnourished patient with impaired vascular tone. Epidural catheter in place but no infusion. P -off pressors - If pain intractable, recommend narcotic only epidural, avoiding local anesthetic. -Judicious pain control with multimodal analgesia to minimize narcotic use.  Postoperative anemia. Raymond Gurney Witness Suspect delusional effects as hemoglobin of 11 may have been factitiously elevated due to hemoconcentration from decreased oral intake. Intravascular volume contraction could not also account for hypotension encountered on induction of anesthesia. P -Careful fluid resuscitation to avoid further hemodilution -We will start Aranesp to stimulate erythropoiesis. -Will continue Infed 100mg  (76ml) qD. ((Calculated need amounts to 1000mg  (72ml) total)) -Continue folic acid   Hyperglycemia Transition off of insulin gtt per primary  Hypokalemia Replaced overnight   Best practice:  Diet: NPO Pain/Anxiety/Delirium protocol (if indicated): APAP, fentanyl  VAP protocol (if indicated): Not indicated DVT prophylaxis: SCD GI prophylaxis: Not indicated Glucose control: Transitioning off of gtt 11/4 Mobility: Per primary  Code Status: Full Family Communication: Patient has been updated. Disposition: In ICU at present. Discussed with primary-- PCCM will plan to sign off if patient remains off pressors; patient is actively transitioning off of insulin gtt and patient likely will be able to leave ICU   Labs   CBC: Recent Labs  Lab 09/22/19 1100 10/07/2019 1121 10/02/2019 1154 10/04/2019 1202 10/10/2019 1522 09/28/19 0443  WBC 5.2  --   --   --  11.0* 12.5*  NEUTROABS 4.0  --   --   --   --   --   HGB 11.3* 9.2* 5.4* 5.4* 5.5* 4.5*  HCT 35.7* 27.0* 16.0* 16.0* 16.5* 14.1*  MCV 103.2*  --   --   --  100.0 102.2*  PLT 374  --   --   --  189 [id number]    Basic Metabolic Panel: Recent Labs  Lab 09/22/19 1100  10/02/2019 1121 10/22/2019 1154 09/26/2019 1202 10/11/2019 1522 09/28/19 0443  NA 139 139 140 140 138 141  K 3.9 3.5 3.2* 3.2* 3.5 3.2*  CL 105  --  106  --  103 111  CO2 23  --   --   --  19* 21*  GLUCOSE 181*  --  242*  --  308* 118*  BUN 14  --  9  --  11 9  CREATININE 0.71  --  0.50  --  0.89 0.68  CALCIUM 9.4  --   --   --  8.2* 8.3*  MG  --   --   --   --  1.6* 1.7  PHOS  --   --   --   --   --  1.9*   GFR: Estimated Creatinine Clearance: 52.5 mL/min (by C-G formula based on SCr of 0.68 mg/dL). Recent Labs  Lab 09/22/19 1100 10/16/2019 1522 10/08/2019 1534 09/26/2019 1958 09/28/19 0443  WBC 5.2 11.0*  --   --  12.5*  LATICACIDVEN  --   --  3.2* 7.2*  --     Liver  Function Tests: Recent Labs  Lab 09/22/19 1100 10/18/2019 1522 09/28/19 0443  AST 21 41 35  ALT 21 26 21   ALKPHOS 101 59 49  BILITOT 0.3 1.0 0.3  PROT 7.0 5.4* 5.6*  ALBUMIN 3.6 3.7 4.1   Recent Labs  Lab 09/22/19 1100  LIPASE 37   No results for input(s): AMMONIA in the last 168 hours.  ABG    Component Value Date/Time   PHART 7.331 (L) 09/30/2019 1202   PCO2ART 40.4 09/30/2019 1202   PO2ART 455.0 (H) 10/11/2019 1202   HCO3 21.4 10/11/2019 1202   TCO2 23 09/26/2019 1202   ACIDBASEDEF 4.0 (H) 10/08/2019 1202   O2SAT 100.0 10/05/2019 1202     Coagulation Profile: Recent Labs  Lab 09/22/19 1100 10/20/2019 1217 10/03/2019 1533 09/28/19 0443  INR 1.1 1.5* 1.2 1.5*    Cardiac Enzymes: No results for input(s): CKTOTAL, CKMB, CKMBINDEX, TROPONINI in the last 168 hours.  HbA1C: Hgb A1c MFr Bld  Date/Time Value Ref Range Status  09/22/2019 10:56 AM 5.7 (H) 4.8 - 5.6 % Final    Comment:    (NOTE) Pre diabetes:          5.7%-6.4% Diabetes:              >6.4% Glycemic control for   <7.0% adults with diabetes   03/28/2019 08:03 AM 4.8 4.8 - 5.6 % Final    Comment:    (NOTE) Pre diabetes:          5.7%-6.4% Diabetes:              >6.4% Glycemic control for   <7.0% adults with diabetes      CBG: Recent Labs  Lab 09/28/19 0318 09/28/19 0411 09/28/19 0519 09/28/19 0614 09/28/19 0738  GLUCAP 90 114* 145* 137* 177*   Lactic Acid, Venous    Component Value Date/Time   LATICACIDVEN 7.2 ([place]) 10/05/2019 1958     Eliseo Gum MSN, AGACNP-BC [place] [id number] If no answer, [id number] 09/28/2019, 8:38 AM

## 2019-09-28 NOTE — Progress Notes (Signed)
[facility] ADULT ICU REPLACEMENT PROTOCOL FOR AM LAB REPLACEMENT ONLY  The patient does apply for the [facility] Adult ICU Electrolyte Replacment Protocol based on the criteria listed below:   1. Is GFR >/= 40 ml/min? Yes.    Patient's GFR today is >60 2. Is urine output >/= 0.5 ml/kg/hr for the last 6 hours? Yes.   Patient's UOP is 2 ml/kg/hr 3. Is BUN < 60 mg/dL? Yes.    Patient's BUN today is 9 4. Abnormal electrolyte(s): Mag- 1.7 5. Ordered repletion with: per protocol 6. If a panic level lab has been reported, has the CCM MD in charge been notified? Yes.  .   Physician:  Dr. Terrill Mohr, Philis Nettle 09/28/2019 6:40 AM

## 2019-09-28 NOTE — Progress Notes (Signed)
CRITICAL VALUE ALERT  Critical Value:  HGB 4.5  Date & Time Notied:  09/28/19 @0610    Provider Notified: Warren Lacy  Orders Received/Actions taken: Notified MD

## 2019-09-28 NOTE — Anesthesia Post-op Follow-up Note (Addendum)
  Anesthesia Pain Follow-up Note  Patient: Julie Rollins  Day #: 1  Date of Follow-up: 09/28/2019 Time: 12:16 PM  Last Vitals:  Vitals:   09/28/19 1000 09/28/19 1100  BP: (!) 126/54 124/74  Pulse: (!) 117 (!) 102  Resp: (!) 22 17  Temp:    SpO2: 100% 100%    Level of Consciousness: alert  Pain: mild   Side Effects:None  Catheter Site Exam:clean, dry, no drainage     Plan: Patient doing well without epidural infusion running and is requesting "one less tube attached to me." Catheter removed at surgeon request, tip in tact. Will sign off, call with questions.  Pervis Hocking

## 2019-09-28 NOTE — Progress Notes (Signed)
[place] Progress Note Patient Name: Julie Rollins DOB: 1946-07-07 MRN: [id number]   Date of Service  09/28/2019  HPI/Events of Note  K+ = 3.2, PO4--- = 1.9 and Creatinine = 0.68.  eICU Interventions  Will replace K+ and PO4---.      Intervention Category Major Interventions: Electrolyte abnormality - evaluation and management  Sommer,Steven Eugene 09/28/2019, 6:41 AM

## 2019-09-28 NOTE — Addendum Note (Signed)
Addendum  created 09/28/19 0718 by Sammie Bench, CRNA   Order list changed

## 2019-09-29 ENCOUNTER — Inpatient Hospital Stay (HOSPITAL_COMMUNITY): Payer: Medicare HMO

## 2019-09-29 DIAGNOSIS — J9601 Acute respiratory failure with hypoxia: Secondary | ICD-10-CM

## 2019-09-29 DIAGNOSIS — J189 Pneumonia, unspecified organism: Secondary | ICD-10-CM | POA: Diagnosis not present

## 2019-09-29 LAB — GLUCOSE, CAPILLARY
Glucose-Capillary: 124 mg/dL — ABNORMAL HIGH (ref 70–99)
Glucose-Capillary: 126 mg/dL — ABNORMAL HIGH (ref 70–99)
Glucose-Capillary: 127 mg/dL — ABNORMAL HIGH (ref 70–99)
Glucose-Capillary: 145 mg/dL — ABNORMAL HIGH (ref 70–99)
Glucose-Capillary: 169 mg/dL — ABNORMAL HIGH (ref 70–99)
Glucose-Capillary: 98 mg/dL (ref 70–99)

## 2019-09-29 LAB — TYPE AND SCREEN
ABO/RH(D): A POS
Antibody Screen: NEGATIVE
Unit division: 0
Unit division: 0

## 2019-09-29 LAB — COMPREHENSIVE METABOLIC PANEL
ALT: 14 U/L (ref 0–44)
AST: 23 U/L (ref 15–41)
Albumin: 4.6 g/dL (ref 3.5–5.0)
Alkaline Phosphatase: 51 U/L (ref 38–126)
Anion gap: 8 (ref 5–15)
BUN: <5 mg/dL — ABNORMAL LOW (ref 8–23)
CO2: 21 mmol/L — ABNORMAL LOW (ref 22–32)
Calcium: 8.7 mg/dL — ABNORMAL LOW (ref 8.9–10.3)
Chloride: 115 mmol/L — ABNORMAL HIGH (ref 98–111)
Creatinine, Ser: 0.73 mg/dL (ref 0.44–1.00)
GFR calc Af Amer: >60 mL/min (ref >60)
GFR calc non Af Amer: >60 mL/min (ref >60)
Glucose, Bld: 134 mg/dL — ABNORMAL HIGH (ref 70–99)
Potassium: 3.6 mmol/L (ref 3.5–5.1)
Sodium: 144 mmol/L (ref 135–145)
Total Bilirubin: 0.6 mg/dL (ref 0.3–1.2)
Total Protein: 5.8 g/dL — ABNORMAL LOW (ref 6.5–8.1)

## 2019-09-29 LAB — CBC
HCT: 13 % — ABNORMAL LOW (ref 36.0–46.0)
Hemoglobin: 4.1 g/dL — CL (ref 12.0–15.0)
MCH: 32.5 pg (ref 26.0–34.0)
MCHC: 31.5 g/dL (ref 30.0–36.0)
MCV: 103.2 fL — ABNORMAL HIGH (ref 80.0–100.0)
Platelets: 152 10*3/uL (ref 150–400)
RBC: 1.26 MIL/uL — ABNORMAL LOW (ref 3.87–5.11)
RDW: 15.9 % — ABNORMAL HIGH (ref 11.5–15.5)
WBC: 15.5 10*3/uL — ABNORMAL HIGH (ref 4.0–10.5)
nRBC: 0 % (ref 0.0–0.2)

## 2019-09-29 LAB — PHOSPHORUS: Phosphorus: 1.8 mg/dL — ABNORMAL LOW (ref 2.5–4.6)

## 2019-09-29 LAB — HEMOGLOBIN AND HEMATOCRIT, BLOOD
HCT: 36 % (ref 36.0–46.0)
Hemoglobin: 12.4 g/dL (ref 12.0–15.0)

## 2019-09-29 LAB — BPAM RBC
Blood Product Expiration Date: 202011302359
Blood Product Expiration Date: 202011302359
Unit Type and Rh: 6200
Unit Type and Rh: 6200

## 2019-09-29 LAB — MAGNESIUM
Magnesium: 2.5 mg/dL — ABNORMAL HIGH (ref 1.7–2.4)
Magnesium: 2.1 mg/dL (ref 1.7–2.4)

## 2019-09-29 MED ORDER — SODIUM CHLORIDE 0.9% IV SOLUTION
Freq: Once | INTRAVENOUS | Status: AC
  Administered 2019-09-29: 10:00:00 via INTRAVENOUS

## 2019-09-29 MED ORDER — VANCOMYCIN HCL IN DEXTROSE 1-5 GM/200ML-% IV SOLN
1000.0000 mg | INTRAVENOUS | Status: DC
  Administered 2019-09-29 – 2019-10-02 (×4): 1000 mg via INTRAVENOUS
  Filled 2019-09-29 (×4): qty 200

## 2019-09-29 MED ORDER — SODIUM CHLORIDE 0.9 % IV SOLN
2.0000 g | Freq: Two times a day (BID) | INTRAVENOUS | Status: AC
  Administered 2019-09-29 – 2019-10-05 (×14): 2 g via INTRAVENOUS
  Filled 2019-09-29 (×15): qty 2

## 2019-09-29 MED ORDER — LORAZEPAM 2 MG/ML IJ SOLN
1.0000 mg | Freq: Three times a day (TID) | INTRAMUSCULAR | Status: DC | PRN
  Administered 2019-09-29 – 2019-11-03 (×17): 1 mg via INTRAVENOUS
  Filled 2019-09-29 (×17): qty 1

## 2019-09-29 MED ORDER — OSMOLITE 1.5 CAL PO LIQD
1000.0000 mL | ORAL | Status: DC
  Administered 2019-09-29 – 2019-10-04 (×5): 1000 mL
  Filled 2019-09-29 (×6): qty 1000

## 2019-09-29 MED ORDER — FUROSEMIDE 10 MG/ML IJ SOLN
20.0000 mg | Freq: Once | INTRAMUSCULAR | Status: AC
  Administered 2019-09-29: 20 mg via INTRAVENOUS
  Filled 2019-09-29: qty 2

## 2019-09-29 NOTE — Progress Notes (Signed)
Initial Nutrition Assessment  DOCUMENTATION CODES:   Not applicable  INTERVENTION:   Initiate Osmolite 1.5 @ 20 ml/hr via J tube  As able recommend increasing to goal rate of 50 ml/hr (1200 ml/day)  Recommend adding 30 ml Prostat BID  Provides: 2000 kcal, 105 grams protein, and 914 ml free water.    NUTRITION DIAGNOSIS:   Increased nutrient needs related to cancer and cancer related treatments as evidenced by estimated needs.  GOAL:   Patient will meet greater than or equal to 90% of their needs  MONITOR:   Diet advancement, TF tolerance  REASON FOR ASSESSMENT:   Consult Enteral/tube feeding initiation and management  ASSESSMENT:   Pt with PMH of breast cancer s/p lumpectomy and XRT, hypothyroidism, HTN, dx with pancreatic cancer May 2020 s/p ERCP with stent, chemo (complicated by nausea, weight loss, duodenal obstruction requiring TPN) now admitted for pancreaticoduodenectomy with cholecystectomy with removal of 4 cm from pancreatic head.   Pt developed acute hypoxic respiratory failure from HCAP and anemia after surgery.  Per CCS pt can start sips today with NG tube removal. Can start trickle feeds today.   Per chart review, weight has increased   Medications reviewed and include: lasix x 2 doses, 2-6 units novolog every 4 hours, 5 units levemir every 12 hours, folic acid D5 NS with 20 mEq KCl/L @ 75 ml/hr  Labs reviewed: hgb 4.1 (L) - pt agreed to blood transfusion  JP drain x 2: 435 ml    Diet Order:   Diet Order            Diet NPO time specified  Diet effective now              EDUCATION NEEDS:   No education needs have been identified at this time  Skin:  Skin Assessment: Reviewed RN Assessment  Last BM:  11/3  Height:   Ht Readings from Last 1 Encounters:  10/14/2019 5\' 4"  (1.626 m)    Weight:   Wt Readings from Last 1 Encounters:  10/01/2019 59.8 kg    Ideal Body Weight:  54.5 kg  BMI:  Body mass index is 22.64 kg/m.  Estimated  Nutritional Needs:   Kcal:  2000-2200  Protein:  100-115 grams  Fluid:  2L/day   Maylon Peppers RD, [place], [place] Pager [phone] After Hours Pager

## 2019-09-29 NOTE — Progress Notes (Signed)
[place] Progress Note Patient Name: Julie Rollins DOB: 07-Nov-1946 MRN: [id number]   Date of Service  09/29/2019  HPI/Events of Note  Multiple issues: 1. Hgb = 4.1 - Already on Aranesp and 2. CXR this AM reveals New hazy airspace opacity bilaterally without Kerley lines or pleural effusion, favor pneumonia or aspiration over alveolar edema.  eICU Interventions  Plan: 1. Patient is [name]'s Witness --> no blood transfusion. 2. Sputum culture now. 3. Vancomycin and Cefepime per pharmacy consultation.     Intervention Category Major Interventions: Infection - evaluation and management  Nyaira Hodgens Eugene 09/29/2019, 5:41 AM

## 2019-09-29 NOTE — Progress Notes (Signed)
NAME:  Julie Rollins, MRN:  [id number], DOB:  1946-10-08, LOS: 2 ADMISSION DATE:  10/07/2019, CONSULTATION DATE:  09/26/2019 REFERRING MD:  Dr. Barry Dienes, CCS, CHIEF COMPLAINT:  Hypotension after Whipple   Brief History   73 yo female [place] developed painless jaundice in May 2020 and found to have pancreatic cancer.  Had ERCP with stent and neoadjuvant chemotherapy complicated by nausea, weight loss, and duodenal obstruction requiring TPN.  Admitted 10/10/2019 for pancreaticoduodenectomy with cholecystectomy with removal of 4 cm from pancreatic head with EBL 650 ml.  Developed hypotension around the time of surgery and PCCM asked to assist with management in ICU.  Past Medical History  Breast cancer s/p lumpectomy and XRT, Hypothyroidism, HTN, MRSE bacteremia October 2020  [facility] Events   11/03 pancreaticoduodenectomy with cholecystectomy with removal of 4 cm from pancreatic head with EBL 650 ml 11/05 increased WOB/hypoxia >> add ABx for PNA; Pt agreed to PRBC transfusion  Consults:    Procedures:  CVL 11/03 >> A line 11/03 >>   Significant Diagnostic Tests:    Micro Data:  SARS CoV2 10/30 >> not detected  Antimicrobials:  Vancomycin 11/05 >> Cefepime 11/05 >>   Interim history/subjective:  Feels congested in her chest.  Can't cough up sputum.  Wants ice chips.  Denies chest pain.  Objective   Blood pressure 136/62, pulse (!) 124, temperature 98.3 F (36.8 C), temperature source Axillary, resp. rate 20, height 5\' 4"  (1.626 m), weight 59.8 kg, SpO2 95 %.        Intake/Output Summary (Last 24 hours) at 09/29/2019 0800 Last data filed at 09/29/2019 0700 Gross per 24 hour  Intake 2593.14 ml  Output 2620 ml  Net -26.86 ml   Filed Weights   10/03/2019 [id number]  Weight: 59.8 kg    Examination:  General - alert, using accessory muscles Eyes - pupils reactive ENT - NRB and nasal cannula on Cardiac - regular, tachycardic Chest - b/l crackles Rt > Lt  Abdomen - soft, drains in place Extremities - no cyanosis, clubbing, or edema Skin - no rashes Neuro - follows commands, moves all extremities  CXR (reviewed by me) - b/l ASD Rt > [facility] Problem list   Hypotension after surgery  Assessment & Plan:   Acute hypoxic respiratory failure from HCAP. - oxygen to keep SpO2 > 92% - hopefully can avoid need for intubation - f/u CXR - bronchial hygiene  HCAP likely from Gram negative and/or Gram positive organisms. - day 1 of vancomycin/cefepime  Anemia after surgery. - she is Jehovah's Witness, but agreed to PRBC transfusion on 11/05 - f/u Hb after PRBC transfusion 11/05 - goal Hb > 7 - continue aranesp with infed  Pancreatic cancer s/p Whipple. Moderate protein calorie malnutrition. - post op care, nutrition per CCS  Hx of hypothyroidism. - continue IV synthroid  Hyperglycemia. - SSI with levemir  Best practice:  Diet: NPO DVT prophylaxis: SCDs GI prophylaxis: Protonix Mobility: bed rest Code Status: full code Disposition: ICU  Labs    CMP Latest Ref Rng & Units 09/29/2019 09/28/2019 10/12/2019  Glucose 70 - 99 mg/dL 134(H) 118(H) 308(H)  BUN 8 - 23 mg/dL <5(L) 9 11  Creatinine 0.44 - 1.00 mg/dL 0.73 0.68 0.89  Sodium 135 - 145 mmol/L 144 141 138  Potassium 3.5 - 5.1 mmol/L 3.6 3.2(L) 3.5  Chloride 98 - 111 mmol/L 115(H) 111 103  CO2 22 - 32 mmol/L 21(L) 21(L) 19(L)  Calcium 8.9 - 10.3  mg/dL 8.7(L) 8.3(L) 8.2(L)  Total Protein 6.5 - 8.1 g/dL 5.8(L) 5.6(L) 5.4(L)  Total Bilirubin 0.3 - 1.2 mg/dL 0.6 0.3 1.0  Alkaline Phos 38 - 126 U/L 51 49 59  AST 15 - 41 U/L 23 35 41  ALT 0 - 44 U/L 14 21 26    CBC Latest Ref Rng & Units 09/29/2019 09/28/2019 09/26/2019  WBC 4.0 - 10.5 K/uL 15.5(H) 12.5(H) -  Hemoglobin 12.0 - 15.0 g/dL 4.1(LL) 4.5(LL) -  Hematocrit 36.0 - 46.0 % 13.0(L) 14.1(L) 14.5(LL)  Platelets 150 - 400 K/uL 152 161 -   ABG    Component Value Date/Time   PHART 7.331 (L) 10/19/2019 1202    PCO2ART 40.4 10/09/2019 1202   PO2ART 455.0 (H) 09/30/2019 1202   HCO3 21.4 10/18/2019 1202   TCO2 23 10/08/2019 1202   ACIDBASEDEF 4.0 (H) 09/29/2019 1202   O2SAT 100.0 10/19/2019 1202   CBG (last 3)  Recent Labs    09/28/19 2315 09/29/19 0305 09/29/19 0746  GLUCAP 116* 126* 124*    CC time 42 minutes  D/w Dr. Barry Dienes.  Chesley Mires, MD [facility] Pulmonary/Critical Care 09/29/2019, 8:42 AM

## 2019-09-29 NOTE — Progress Notes (Signed)
Pharmacy Antibiotic Note  Julie Rollins is a 73 y.o. female admitted on 10/13/2019 with s/p whipple procedure.  Pharmacy has been consulted for Vancomycin/Cefepime dosing for possible HCAP. WBC increasing. Abnormal CXR.   Plan: Vancomycin 1000 mg IV q24h >>Estimated AUC: 444 Cefepime 2g IV q12h Trend WBC, temp, renal function  F/U infectious work-up Drug levels as indicated   Height: 5\' 4"  (162.6 cm) Weight: 131 lb 14.4 oz (59.8 kg) IBW/kg (Calculated) : 54.7  Temp (24hrs), Avg:98.3 F (36.8 C), Min:97.5 F (36.4 C), Max:99.3 F (37.4 C)  Recent Labs  Lab 09/22/19 1100 09/25/2019 1154 10/19/2019 1522 10/03/2019 1534 10/02/2019 1958 09/28/19 0443 09/29/19 0439  WBC 5.2  --  11.0*  --   --  12.5* 15.5*  CREATININE 0.71 0.50 0.89  --   --  0.68 0.73  LATICACIDVEN  --   --   --  3.2* 7.2*  --   --     Estimated Creatinine Clearance: 52.5 mL/min (by C-G formula based on SCr of 0.73 mg/dL).    Allergies  Allergen Reactions  . Codeine Nausea Only   Narda Bonds, PharmD, BCPS Clinical Pharmacist Phone: [phone]

## 2019-09-29 NOTE — Progress Notes (Signed)
2 Days Post-Op   Subjective/Chief Complaint: Pt feels very weak and states that she is willing to receive blood.  States that it is not really religious for her.  Looks like she may have a pneumonia on CXR.   Objective: Vital signs in last 24 hours: Temp:  [97.5 F (36.4 C)-99.3 F (37.4 C)] 98.3 F (36.8 C) (11/05 0400) Pulse Rate:  [98-129] 124 (11/05 0700) Resp:  [13-28] 20 (11/05 0700) BP: (95-143)/(49-80) 136/62 (11/05 0700) SpO2:  [86 %-100 %] 95 % (11/05 0700) Last BM Date: 09/26/2019  Intake/Output from previous day: 11/04 0701 - 11/05 0700 In: 2711 [I.V.:1898.8; IV Piggyback:812.2] Out: 2620 [Urine:1825; Emesis/NG output:200; Drains:595] Intake/Output this shift: No intake/output data recorded.  General appearance: alert, cooperative and moderate distress Resp: slightly labored breathing, but able to talk Cardio: tachycardic, regular GI: soft, non distended, approp tender.  drains serosang Extremities: extremities normal, atraumatic, no cyanosis or edema  Lab Results:  Recent Labs    09/28/19 0443 09/29/19 0439  WBC 12.5* 15.5*  HGB 4.5* 4.1*  HCT 14.1* 13.0*  PLT 161 152   BMET Recent Labs    09/28/19 0443 09/29/19 0439  NA 141 144  K 3.2* 3.6  CL 111 115*  CO2 21* 21*  GLUCOSE 118* 134*  BUN 9 <5*  CREATININE 0.68 0.73  CALCIUM 8.3* 8.7*   PT/INR Recent Labs    10/08/2019 1533 09/28/19 0443  LABPROT 15.3* 18.1*  INR 1.2 1.5*   ABG Recent Labs    10/22/2019 1121 10/07/2019 1202  PHART 7.412 7.331*  HCO3 22.7 21.4    Studies/Results: Dg Chest Port 1 View  Result Date: 09/29/2019 CLINICAL DATA:  Respiratory distress EXAM: PORTABLE CHEST 1 VIEW COMPARISON:  Two days ago FINDINGS: The orogastric tube tip reaches the stomach. Right IJ and PICC with tips at the upper cavoatrial junction. New hazy opacification on both sides that is superimposed on streaky opacity from atelectasis or scarring-the latter stable. Borderline heart size that is  stable. No visible effusion or pneumothorax. IMPRESSION: New hazy airspace opacity without Kerley lines or pleural effusion, favor pneumonia or aspiration over alveolar edema. Electronically Signed   By: Monte Fantasia M.D.   On: 09/29/2019 05:12   Dg Chest Port 1 View  Result Date: 10/09/2019 CLINICAL DATA:  Central line placement status post Whipple. EXAM: PORTABLE CHEST 1 VIEW COMPARISON:  Chest x-ray 09/09/2019 FINDINGS: S/p right IJ and right-sided PICC line placement. The PICC line terminates at the upper portion of the right atrium. The right IJ catheter terminating in the caval to atrial junction. Left-sided Port-A-Cath has been removed. Enteric tube courses into the abdomen off the field of the radiograph. A pancreatic stent is partially visualized as is a surgical drain in the right upper quadrant and mid abdomen. No signs of pneumothorax or consolidation. Cardiomediastinal contours are stable. It has No acute bone finding. Signs of cervical spine fusion partially imaged. IMPRESSION: 1. The PICC line terminates at the upper portion of the right atrium. 2. The right IJ catheter terminates in the caval to atrial junction. 3. Postoperative changes partially visualized in the upper abdomen. 4. No acute cardiopulmonary process. Electronically Signed   By: Zetta Bills M.D.   On: 09/29/2019 15:08    Anti-infectives: Anti-infectives (From admission, onward)   Start     Dose/Rate Route Frequency Ordered Stop   09/29/19 0630  vancomycin (VANCOCIN) IVPB 1000 mg/200 mL premix     1,000 mg 200 mL/hr over 60 Minutes Intravenous  Every 24 hours 09/29/19 0625     09/29/19 0615  ceFEPIme (MAXIPIME) 2 g in sodium chloride 0.9 % 100 mL IVPB     2 g 200 mL/hr over 30 Minutes Intravenous Every 12 hours 09/29/19 0605     10/12/2019 2200  ceFAZolin (ANCEF) IVPB 2g/100 mL premix     2 g 200 mL/hr over 30 Minutes Intravenous Every 8 hours 10/01/2019 1523 09/28/19 1900   09/30/2019 0600  ceFAZolin (ANCEF) IVPB  2g/100 mL premix     2 g 200 mL/hr over 30 Minutes Intravenous On call to O.R. 10/18/2019 0550 10/24/2019 1338   09/29/2019 0558  ceFAZolin (ANCEF) 2-4 GM/100ML-% IVPB    Note to Pharmacy: Marga Melnick   : cabinet override      10/04/2019 0558 10/23/2019 1814      Assessment/Plan: s/p Procedure(s) with comments: WHIPPLE PROCEDURE (N/A) - GENERAL AND EPIDURAL GASTROJEJUNOSTOMY, J TUBE (N/A) - GENERAL AND EPIDURAL Laparoscopy Diagnostic (N/A) ABL anemia and anemia of chronic disease - pt ok to be transfusied today.  Will give 4 units with lasix.  Pancreatic cancer - await pathology HCAP - on antibiotics D/c ngt today. Sips today. Nutrition consult - start trickle feeds today.   LOS: 2 days    Stark Klein 09/29/2019

## 2019-09-29 NOTE — Progress Notes (Signed)
Pt placed on Bipap at this time per MD order due to continued hypoxia. Pt tolerating well, RT will monitor

## 2019-09-30 ENCOUNTER — Ambulatory Visit: Payer: Medicare HMO | Admitting: Hematology

## 2019-09-30 ENCOUNTER — Other Ambulatory Visit: Payer: Medicare HMO

## 2019-09-30 ENCOUNTER — Inpatient Hospital Stay (HOSPITAL_COMMUNITY): Payer: Medicare HMO

## 2019-09-30 DIAGNOSIS — J9601 Acute respiratory failure with hypoxia: Secondary | ICD-10-CM | POA: Diagnosis not present

## 2019-09-30 LAB — TYPE AND SCREEN
ABO/RH(D): A POS
Antibody Screen: NEGATIVE
Unit division: 0
Unit division: 0
Unit division: 0
Unit division: 0

## 2019-09-30 LAB — GLUCOSE, CAPILLARY
Glucose-Capillary: 82 mg/dL (ref 70–99)
Glucose-Capillary: 145 mg/dL — ABNORMAL HIGH (ref 70–99)
Glucose-Capillary: 107 mg/dL — ABNORMAL HIGH (ref 70–99)
Glucose-Capillary: 148 mg/dL — ABNORMAL HIGH (ref 70–99)
Glucose-Capillary: 99 mg/dL (ref 70–99)
Glucose-Capillary: 123 mg/dL — ABNORMAL HIGH (ref 70–99)

## 2019-09-30 LAB — BPAM RBC
Blood Product Expiration Date: 202011122359
Blood Product Expiration Date: 202011302359
Blood Product Expiration Date: 202012012359
Blood Product Expiration Date: 202012012359
ISSUE DATE / TIME: 202011050937
ISSUE DATE / TIME: 202011051213
ISSUE DATE / TIME: 202011051529
ISSUE DATE / TIME: 202011051808
Unit Type and Rh: 5100
Unit Type and Rh: 6200
Unit Type and Rh: 6200
Unit Type and Rh: 6200

## 2019-09-30 LAB — BASIC METABOLIC PANEL
Anion gap: 12 (ref 5–15)
BUN: 7 mg/dL — ABNORMAL LOW (ref 8–23)
CO2: 25 mmol/L (ref 22–32)
Calcium: 8.4 mg/dL — ABNORMAL LOW (ref 8.9–10.3)
Chloride: 105 mmol/L (ref 98–111)
Creatinine, Ser: 0.59 mg/dL (ref 0.44–1.00)
GFR calc Af Amer: >60 mL/min (ref >60)
GFR calc non Af Amer: >60 mL/min (ref >60)
Glucose, Bld: 104 mg/dL — ABNORMAL HIGH (ref 70–99)
Potassium: 2.8 mmol/L — ABNORMAL LOW (ref 3.5–5.1)
Sodium: 142 mmol/L (ref 135–145)

## 2019-09-30 LAB — CBC
HCT: 35.5 % — ABNORMAL LOW (ref 36.0–46.0)
Hemoglobin: 12.3 g/dL (ref 12.0–15.0)
MCH: 31.6 pg (ref 26.0–34.0)
MCHC: 34.6 g/dL (ref 30.0–36.0)
MCV: 91.3 fL (ref 80.0–100.0)
Platelets: 109 10*3/uL — ABNORMAL LOW (ref 150–400)
RBC: 3.89 MIL/uL (ref 3.87–5.11)
RDW: 17.2 % — ABNORMAL HIGH (ref 11.5–15.5)
WBC: 15.3 10*3/uL — ABNORMAL HIGH (ref 4.0–10.5)
nRBC: 0.3 % — ABNORMAL HIGH (ref 0.0–0.2)

## 2019-09-30 LAB — MAGNESIUM
Magnesium: 1.9 mg/dL (ref 1.7–2.4)
Magnesium: 1.8 mg/dL (ref 1.7–2.4)

## 2019-09-30 LAB — COMPREHENSIVE METABOLIC PANEL
ALT: 15 U/L (ref 0–44)
AST: 18 U/L (ref 15–41)
Albumin: 4.1 g/dL (ref 3.5–5.0)
Alkaline Phosphatase: 64 U/L (ref 38–126)
Anion gap: 8 (ref 5–15)
BUN: 5 mg/dL — ABNORMAL LOW (ref 8–23)
CO2: 26 mmol/L (ref 22–32)
Calcium: 8.5 mg/dL — ABNORMAL LOW (ref 8.9–10.3)
Chloride: 111 mmol/L (ref 98–111)
Creatinine, Ser: 0.61 mg/dL (ref 0.44–1.00)
GFR calc Af Amer: >60 mL/min (ref >60)
GFR calc non Af Amer: >60 mL/min (ref >60)
Glucose, Bld: 90 mg/dL (ref 70–99)
Potassium: 3 mmol/L — ABNORMAL LOW (ref 3.5–5.1)
Sodium: 145 mmol/L (ref 135–145)
Total Bilirubin: 1.1 mg/dL (ref 0.3–1.2)
Total Protein: 6.3 g/dL — ABNORMAL LOW (ref 6.5–8.1)

## 2019-09-30 LAB — BLOOD PRODUCT ORDER (VERBAL) VERIFICATION

## 2019-09-30 LAB — PHOSPHORUS
Phosphorus: 1.3 mg/dL — ABNORMAL LOW (ref 2.5–4.6)
Phosphorus: 2.8 mg/dL (ref 2.5–4.6)

## 2019-09-30 MED ORDER — POTASSIUM PHOSPHATES 15 MMOLE/5ML IV SOLN
20.0000 mmol | Freq: Once | INTRAVENOUS | Status: AC
  Administered 2019-09-30: 20 mmol via INTRAVENOUS
  Filled 2019-09-30: qty 6.67

## 2019-09-30 MED ORDER — FUROSEMIDE 10 MG/ML IJ SOLN
40.0000 mg | Freq: Once | INTRAMUSCULAR | Status: AC
  Administered 2019-09-30: 40 mg via INTRAVENOUS
  Filled 2019-09-30: qty 4

## 2019-09-30 NOTE — Progress Notes (Signed)
3 Days Post-Op   Subjective/Chief Complaint: Had to do bipap last night.  Pneumonia looks worse.  No n/v.  Tube feeds running at 20 ml/hr.  Objective: Vital signs in last 24 hours: Temp:  [98.2 F (36.8 C)-100.8 F (38.2 C)] 98.4 F (36.9 C) (11/06 0400) Pulse Rate:  [99-130] 123 (11/06 0800) Resp:  [16-40] 35 (11/06 0800) BP: (108-167)/(46-91) 157/91 (11/06 0800) SpO2:  [90 %-100 %] 91 % (11/06 0800) FiO2 (%):  [60 %-90 %] 80 % (11/06 0758) Weight:  [60.5 kg] 60.5 kg (11/06 0326) Last BM Date: 09/26/2019  Intake/Output from previous day: 11/05 0701 - 11/06 0700 In: 2747.9 [I.V.:1584.7; Blood:300; NG/GT:249.3; IV Piggyback:613.9] Out: [id number] [Urine:3860; Drains:280] Intake/Output this shift: No intake/output data recorded.  General appearance: bipap in place.   Resp: slightly labored breathing, but able to talk Cardio: tachycardic, regular GI: soft, sl distended, approp tender.  drains serosang Extremities: extremities normal, atraumatic, no cyanosis or edema  Lab Results:  Recent Labs    09/29/19 0439 09/29/19 2131 09/30/19 0337  WBC 15.5*  --  15.3*  HGB 4.1* 12.4 12.3  HCT 13.0* 36.0 35.5*  PLT 152  --  109*   BMET Recent Labs    09/29/19 0439 09/30/19 0337  NA 144 145  K 3.6 3.0*  CL 115* 111  CO2 21* 26  GLUCOSE 134* 90  BUN <5* 5*  CREATININE 0.73 0.61  CALCIUM 8.7* 8.5*   PT/INR Recent Labs    10/03/2019 1533 09/28/19 0443  LABPROT 15.3* 18.1*  INR 1.2 1.5*   ABG Recent Labs    10/18/2019 1121 10/22/2019 1202  PHART 7.412 7.331*  HCO3 22.7 21.4    Studies/Results: Dg Chest Port 1 View  Result Date: 09/30/2019 CLINICAL DATA:  Respiratory failure. Whipple procedure with gastrojejunostomy on 09/28/2019. EXAM: PORTABLE CHEST 1 VIEW COMPARISON:  Yesterday. FINDINGS: Right PICC tip at the superior cavoatrial junction. Right jugular catheter tip also at the superior cavoatrial junction. Stable mildly enlarged cardiac silhouette. Extensive bilateral  airspace opacity with a significant increase. No visible pleural fluid. Thoracic spine degenerative changes and cervical spine fixation hardware. IMPRESSION: 1. Extensive bilateral pneumonia with a significant increase. 2. Stable mild cardiomegaly. Electronically Signed   By: Claudie Revering M.D.   On: 09/30/2019 08:35   Dg Chest Port 1 View  Result Date: 09/29/2019 CLINICAL DATA:  Respiratory distress EXAM: PORTABLE CHEST 1 VIEW COMPARISON:  Two days ago FINDINGS: The orogastric tube tip reaches the stomach. Right IJ and PICC with tips at the upper cavoatrial junction. New hazy opacification on both sides that is superimposed on streaky opacity from atelectasis or scarring-the latter stable. Borderline heart size that is stable. No visible effusion or pneumothorax. IMPRESSION: New hazy airspace opacity without Kerley lines or pleural effusion, favor pneumonia or aspiration over alveolar edema. Electronically Signed   By: Monte Fantasia M.D.   On: 09/29/2019 05:12    Anti-infectives: Anti-infectives (From admission, onward)   Start     Dose/Rate Route Frequency Ordered Stop   09/29/19 0630  vancomycin (VANCOCIN) IVPB 1000 mg/200 mL premix     1,000 mg 200 mL/hr over 60 Minutes Intravenous Every 24 hours 09/29/19 0625     09/29/19 0615  ceFEPIme (MAXIPIME) 2 g in sodium chloride 0.9 % 100 mL IVPB     2 g 200 mL/hr over 30 Minutes Intravenous Every 12 hours 09/29/19 0605     10/12/2019 2200  ceFAZolin (ANCEF) IVPB 2g/100 mL premix     2  g 200 mL/hr over 30 Minutes Intravenous Every 8 hours 09/29/2019 1523 09/28/19 1900   10/02/2019 0600  ceFAZolin (ANCEF) IVPB 2g/100 mL premix     2 g 200 mL/hr over 30 Minutes Intravenous On call to O.R. 09/25/2019 0550 10/12/2019 1338   10/05/2019 0558  ceFAZolin (ANCEF) 2-4 GM/100ML-% IVPB    Note to Pharmacy: Marga Melnick   : cabinet override      10/11/2019 0558 10/23/2019 1814      Assessment/Plan: s/p Procedure(s) with comments: WHIPPLE PROCEDURE (N/A) - GENERAL AND  EPIDURAL GASTROJEJUNOSTOMY, J TUBE (N/A) - GENERAL AND EPIDURAL Laparoscopy Diagnostic (N/A)  Pancreatic cancer - await pathology HCAP - on antibiotics HCAP worse.  PCCM working on diuresing (CVP 18).  Hopefully will not need intubation.   Looks much better after transfusion.   Nutrition consult - trickle feeds.     LOS: 3 days    Stark Klein 09/30/2019

## 2019-09-30 NOTE — Progress Notes (Signed)
NAME:  Julie Rollins, MRN:  [id number], DOB:  1946/03/01, LOS: 3 ADMISSION DATE:  10/09/2019, CONSULTATION DATE:  10/11/2019 REFERRING MD:  Dr. Barry Dienes, CCS, CHIEF COMPLAINT:  Hypotension after Whipple   Brief History   73 yo female [place] developed painless jaundice in May 2020 and found to have pancreatic cancer.  Had ERCP with stent and neoadjuvant chemotherapy complicated by nausea, weight loss, and duodenal obstruction requiring TPN.  Admitted 10/16/2019 for pancreaticoduodenectomy with cholecystectomy with removal of 4 cm from pancreatic head with EBL 650 ml.  Developed hypotension around the time of surgery and PCCM asked to assist with management in ICU.  Past Medical History  Breast cancer s/p lumpectomy and XRT, Hypothyroidism, HTN, MRSE bacteremia October 2020  [place] [facility] Events   11/03 pancreaticoduodenectomy with cholecystectomy with removal of 4 cm from pancreatic head with EBL 650 ml 11/05 increased WOB/hypoxia >> add ABx for PNA; Pt agreed to PRBC transfusion  Consults:    Procedures:  CVL 11/03 >> A line 11/03 >>   Significant Diagnostic Tests:    Micro Data:  SARS CoV2 10/30 >> not detected  Antimicrobials:  Vancomycin 11/05 >> Cefepime 11/05 >>   Interim history/subjective:  C/o dry mouth.  Remains on Bipap > hypoxia with brief transition off for mouth care.  Objective   Blood pressure (!) 157/91, pulse (!) 123, temperature 98.4 F (36.9 C), temperature source Oral, resp. rate (!) 35, height 5\' 4"  (1.626 m), weight 60.5 kg, SpO2 91 %.    Vent Mode: BIPAP FiO2 (%):  [60 %-90 %] 80 % Set Rate:  [8 bmp] 8 bmp PEEP:  [6 cmH20] 6 cmH20   Intake/Output Summary (Last 24 hours) at 09/30/2019 0831 Last data filed at 09/30/2019 0600 Gross per 24 hour  Intake 2629.91 ml  Output 4140 ml  Net -1510.09 ml   Filed Weights   10/04/2019 0611 09/30/19 0326  Weight: 59.8 kg 60.5 kg    Examination:  General - alert Eyes - pupils reactive ENT -  Bipap mask on Cardiac - regular, tachycardic Chest - b/l crackles Abdomen - soft, drains in place, wound dressing clean Extremities - 1+ edema Skin - no rashes Neuro - follows commands, moves extremities  CXR (reviewed by me) - b/l ASD worse, Rt > [facility] Problem list   Hypotension after surgery  Assessment & Plan:   Acute hypoxic respiratory failure from HCAP. - goal SpO2 > 88% - f/u CXR - continue Bipap - monitor need for ETT - lasix 40 mg IV x one 11/06  HCAP likely from Gram negative and/or Gram positive organisms. - day 2 vancomycin, cefepime  Anemia after surgery. Thrombocytopenia. - she is Jehovah's Witness, but agreed to PRBC transfusion on 11/05 - f/u CBC - transfuse for Hb < 7 or significant bleeding - continue infed per pharmacy - continue aranesp  Hypokalemia, hypophosphatemia. - f/u electrolytes after replacement on 11/06  Pancreatic cancer s/p Whipple. Moderate protein calorie malnutrition. - post op care, nutrition per CCS  Hx of hypothyroidism. - continue IV synthroid  Hyperglycemia. - SSI with levemir  Best practice:  Diet: NPO DVT prophylaxis: SCDs GI prophylaxis: Protonix Mobility: bed rest Code Status: full code Disposition: ICU  Labs    CMP Latest Ref Rng & Units 09/30/2019 09/29/2019 09/28/2019  Glucose 70 - 99 mg/dL 90 134(H) 118(H)  BUN 8 - 23 mg/dL 5(L) <5(L) 9  Creatinine 0.44 - 1.00 mg/dL 0.61 0.73 0.68  Sodium 135 - 145  mmol/L 145 144 141  Potassium 3.5 - 5.1 mmol/L 3.0(L) 3.6 3.2(L)  Chloride 98 - 111 mmol/L 111 115(H) 111  CO2 22 - 32 mmol/L 26 21(L) 21(L)  Calcium 8.9 - 10.3 mg/dL 8.5(L) 8.7(L) 8.3(L)  Total Protein 6.5 - 8.1 g/dL 6.3(L) 5.8(L) 5.6(L)  Total Bilirubin 0.3 - 1.2 mg/dL 1.1 0.6 0.3  Alkaline Phos 38 - 126 U/L 64 51 49  AST 15 - 41 U/L 18 23 35  ALT 0 - 44 U/L 15 14 21    CBC Latest Ref Rng & Units 09/30/2019 09/29/2019 09/29/2019  WBC 4.0 - 10.5 K/uL 15.3(H) - 15.5(H)  Hemoglobin 12.0 - 15.0  g/dL 12.3 12.4 4.1(LL)  Hematocrit 36.0 - 46.0 % 35.5(L) 36.0 13.0(L)  Platelets 150 - 400 K/uL 109(L) - 152   ABG    Component Value Date/Time   PHART 7.331 (L) 10/14/2019 1202   PCO2ART 40.4 10/06/2019 1202   PO2ART 455.0 (H) 10/05/2019 1202   HCO3 21.4 10/05/2019 1202   TCO2 23 10/08/2019 1202   ACIDBASEDEF 4.0 (H) 10/07/2019 1202   O2SAT 100.0 10/21/2019 1202   CBG (last 3)  Recent Labs    09/29/19 1934 09/29/19 2341 09/30/19 0320  GLUCAP 127* 169* 82    CC time 33 minutes  Chesley Mires, MD [place] 09/30/2019, 8:31 AM

## 2019-09-30 NOTE — TOC Initial Note (Signed)
Transition of Care [facility]) - Initial/Assessment Note    Patient Details  Name: Julie Rollins MRN: [id number] Date of Birth: 1946-07-21  [facility] CM/SW Contact:    Atilano Median, LCSW Phone Number: 09/30/2019, 3:15 PM  Clinical Narrative:                 Patient admitted for whipple procedure. Now with pneumonia requiring IV abx. Will need PT/OT/SLP evals prior to discharge. CSW will continue to follow for dispo planning.   Expected Discharge Plan: Skilled Nursing Facility Barriers to Discharge: Continued Medical Work up   Patient Goals and CMS Choice Patient states their goals for this hospitalization and ongoing recovery are:: unable to assess      Expected Discharge Plan and Services Expected Discharge Plan: [place] In-house Referral: Clinical Social Work Discharge Planning Services: CM Consult   Living arrangements for the past 2 months: [place]                       Prior Living Arrangements/Services Living arrangements for the past 2 months: Single Family Home Lives with:: Self          Need for Family Participation in Patient Care: Yes (Comment) Care giver support system in place?: Yes (comment)   Criminal Activity/Legal Involvement Pertinent to Current Situation/Hospitalization: No - Comment as needed  Activities of Daily Living      Permission Sought/Granted                  Emotional Assessment Appearance:: Appears stated age     Orientation: : Oriented to Self, Oriented to Place, Oriented to  Time, Oriented to Situation   Psych Involvement: No (comment)  Admission diagnosis:  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 Pancreatic cancer ([place]) 10/19/2019  . 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 Malignant neoplasm of head of pancreas ([place])   . Sepsis ([place]) 09/09/2019  . Moderate protein-calorie malnutrition ([place])  08/16/2019  . Sepsis due to undetermined organism ([place]) 07/14/2019  . Hyponatremia   . Fever   . Malnutrition of moderate degree 06/22/2019  . Encounter for nasogastric (NG) tube placement   . Partial gastric outlet obstruction   . Intractable nausea and vomiting 06/13/2019  . Genetic testing 06/08/2019  . BRCA2 gene mutation positive in female 06/08/2019  . Family history of bladder cancer   . Family history of stomach cancer   . Family history of throat cancer   . Personal history of breast cancer   . Port-A-Cath in place 04/27/2019  . Carcinoma of head of pancreas ([place]) 04/04/2019  . Pancreatic mass 03/28/2019  . Elevated LFTs 03/28/2019  . Hypothyroidism 03/28/2019  . Diarrhea 03/28/2019  . Generalized weakness   . Jaundice   . Abnormal finding on GI tract imaging   . Elevated alkaline phosphatase level   . Cervical spine fracture ([place]) 07/31/2018   PCP:  Nolene Ebbs, MD Pharmacy:   [facility] [address]), [place] [address] [place] ([place]) [place] [address] Phone: [phone] Fax: [phone]     Social Determinants of Health (SDOH) Interventions    Readmission Risk Interventions Readmission Risk Prevention Plan 09/30/2019 09/13/2019  Transportation Screening Complete Complete  Medication Review (RN Care Manager) Referral to Pharmacy Referral to Pharmacy  PCP or Specialist appointment within 3-5 days of discharge Complete Not  Complete  PCP/Specialist Appt Not Complete comments - pt established at [facility] and with PCP but DC date unknown  HRI or Home Care Consult Complete Complete  SW Recovery Care/Counseling Consult Complete Complete  Palliative Care Screening Not Complete Not Complete  Comments - pt in procedure did not discuss if any past palliative care involvement, however none in current encounter  [place] Complete Not Applicable  Some recent data might be hidden

## 2019-09-30 NOTE — Progress Notes (Signed)
Pt taken off Bipap at this time and placed on HFNC 30L/100% tolerating well. RT will continue to monitor

## 2019-09-30 NOTE — Progress Notes (Signed)
[place] Progress Note Patient Name: Julie Rollins DOB: [birth year] MRN: [id number]   Date of Service  09/30/2019  HPI/Events of Note  K 3, Phos 1.3, Mg 1.9, creatinine 0.61  eICU Interventions   Ordered Kphos 20 mmol  Also has IVF with K incorporation ongoing     Intervention Category Major Interventions: Electrolyte abnormality - evaluation and management  Judd Lien 09/30/2019, 5:54 AM

## 2019-10-01 ENCOUNTER — Inpatient Hospital Stay (HOSPITAL_COMMUNITY): Payer: Medicare HMO

## 2019-10-01 DIAGNOSIS — C250 Malignant neoplasm of head of pancreas: Secondary | ICD-10-CM | POA: Diagnosis not present

## 2019-10-01 DIAGNOSIS — J189 Pneumonia, unspecified organism: Secondary | ICD-10-CM | POA: Diagnosis not present

## 2019-10-01 LAB — GLUCOSE, CAPILLARY
Glucose-Capillary: 105 mg/dL — ABNORMAL HIGH (ref 70–99)
Glucose-Capillary: 133 mg/dL — ABNORMAL HIGH (ref 70–99)
Glucose-Capillary: 143 mg/dL — ABNORMAL HIGH (ref 70–99)
Glucose-Capillary: 145 mg/dL — ABNORMAL HIGH (ref 70–99)
Glucose-Capillary: 164 mg/dL — ABNORMAL HIGH (ref 70–99)
Glucose-Capillary: 65 mg/dL — ABNORMAL LOW (ref 70–99)
Glucose-Capillary: 95 mg/dL (ref 70–99)

## 2019-10-01 LAB — COMPREHENSIVE METABOLIC PANEL
ALT: 12 U/L (ref 0–44)
AST: 15 U/L (ref 15–41)
Albumin: 3.4 g/dL — ABNORMAL LOW (ref 3.5–5.0)
Alkaline Phosphatase: 85 U/L (ref 38–126)
Anion gap: 10 (ref 5–15)
BUN: 9 mg/dL (ref 8–23)
CO2: 26 mmol/L (ref 22–32)
Calcium: 8.6 mg/dL — ABNORMAL LOW (ref 8.9–10.3)
Chloride: 108 mmol/L (ref 98–111)
Creatinine, Ser: 0.58 mg/dL (ref 0.44–1.00)
GFR calc Af Amer: >60 mL/min (ref >60)
GFR calc non Af Amer: >60 mL/min (ref >60)
Glucose, Bld: 128 mg/dL — ABNORMAL HIGH (ref 70–99)
Potassium: 2.8 mmol/L — ABNORMAL LOW (ref 3.5–5.1)
Sodium: 144 mmol/L (ref 135–145)
Total Bilirubin: 1.3 mg/dL — ABNORMAL HIGH (ref 0.3–1.2)
Total Protein: 5.8 g/dL — ABNORMAL LOW (ref 6.5–8.1)

## 2019-10-01 LAB — MAGNESIUM: Magnesium: 1.9 mg/dL (ref 1.7–2.4)

## 2019-10-01 LAB — CBC
HCT: 36.9 % (ref 36.0–46.0)
Hemoglobin: 12.6 g/dL (ref 12.0–15.0)
MCH: 31.8 pg (ref 26.0–34.0)
MCHC: 34.1 g/dL (ref 30.0–36.0)
MCV: 93.2 fL (ref 80.0–100.0)
Platelets: 105 10*3/uL — ABNORMAL LOW (ref 150–400)
RBC: 3.96 MIL/uL (ref 3.87–5.11)
RDW: 17.5 % — ABNORMAL HIGH (ref 11.5–15.5)
WBC: 12.4 10*3/uL — ABNORMAL HIGH (ref 4.0–10.5)
nRBC: 0 % (ref 0.0–0.2)

## 2019-10-01 LAB — PHOSPHORUS: Phosphorus: 2.1 mg/dL — ABNORMAL LOW (ref 2.5–4.6)

## 2019-10-01 MED ORDER — MAGIC MOUTHWASH
10.0000 mL | Freq: Three times a day (TID) | ORAL | Status: DC | PRN
  Administered 2019-10-01: 10 mL via ORAL
  Filled 2019-10-01 (×2): qty 10

## 2019-10-01 MED ORDER — POTASSIUM PHOSPHATES 15 MMOLE/5ML IV SOLN
30.0000 mmol | Freq: Two times a day (BID) | INTRAVENOUS | Status: AC
  Administered 2019-10-01 (×2): 30 mmol via INTRAVENOUS
  Filled 2019-10-01 (×2): qty 10

## 2019-10-01 MED ORDER — MAGNESIUM SULFATE 2 GM/50ML IV SOLN
2.0000 g | Freq: Once | INTRAVENOUS | Status: AC
  Administered 2019-10-01: 2 g via INTRAVENOUS
  Filled 2019-10-01: qty 50

## 2019-10-01 MED ORDER — DEXTROSE 50 % IV SOLN
INTRAVENOUS | Status: AC
  Administered 2019-10-01: 25 mL
  Filled 2019-10-01: qty 50

## 2019-10-01 NOTE — Progress Notes (Signed)
4 Days Post-Op   Subjective/Chief Complaint: Pt with sore mouth Wants something cold to drink   Objective: Vital signs in last 24 hours: Temp:  [97.8 F (36.6 C)-100 F (37.8 C)] 98.2 F (36.8 C) (11/07 0400) Pulse Rate:  [100-120] 105 (11/07 0742) Resp:  [16-32] 25 (11/07 0742) BP: (131-152)/(59-78) 137/69 (11/07 0742) SpO2:  [79 %-95 %] 94 % (11/07 0742) FiO2 (%):  [80 %-100 %] 100 % (11/07 0742) Last BM Date: 09/25/2019  Intake/Output from previous day: 11/06 0701 - 11/07 0700 In: 2438.1 [I.V.:1099.8; NG/GT:451.9; IV Piggyback:886.4] Out: 2465 [Urine:2275; Drains:190] Intake/Output this shift: No intake/output data recorded.  General appearance: high flow [place] in place.   Resp: slightly labored breathing, but able to talk Cardio: tachycardic, regular GI: soft, sl distended, approp tender.  drains serosang Extremities: extremities normal, atraumatic, no cyanosis or edema   Lab Results:  Recent Labs    09/30/19 0337 10/01/19 0540  WBC 15.3* 12.4*  HGB 12.3 12.6  HCT 35.5* 36.9  PLT 109* 105*   BMET Recent Labs    09/30/19 1810 10/01/19 0540  NA 142 144  K 2.8* 2.8*  CL 105 108  CO2 25 26  GLUCOSE 104* 128*  BUN 7* 9  CREATININE 0.59 0.58  CALCIUM 8.4* 8.6*   PT/INR No results for input(s): LABPROT, INR in the last 72 hours. ABG No results for input(s): PHART, HCO3 in the last 72 hours.  Invalid input(s): PCO2, PO2  Studies/Results: Dg Chest Port 1 View  Result Date: 09/30/2019 CLINICAL DATA:  Respiratory failure. Whipple procedure with gastrojejunostomy on 10/22/2019. EXAM: PORTABLE CHEST 1 VIEW COMPARISON:  Yesterday. FINDINGS: Right PICC tip at the superior cavoatrial junction. Right jugular catheter tip also at the superior cavoatrial junction. Stable mildly enlarged cardiac silhouette. Extensive bilateral airspace opacity with a significant increase. No visible pleural fluid. Thoracic spine degenerative changes and cervical spine fixation hardware.  IMPRESSION: 1. Extensive bilateral pneumonia with a significant increase. 2. Stable mild cardiomegaly. Electronically Signed   By: Claudie Revering M.D.   On: 09/30/2019 08:35    Anti-infectives: Anti-infectives (From admission, onward)   Start     Dose/Rate Route Frequency Ordered Stop   09/29/19 0630  vancomycin (VANCOCIN) IVPB 1000 mg/200 mL premix     1,000 mg 200 mL/hr over 60 Minutes Intravenous Every 24 hours 09/29/19 0625     09/29/19 0615  ceFEPIme (MAXIPIME) 2 g in sodium chloride 0.9 % 100 mL IVPB     2 g 200 mL/hr over 30 Minutes Intravenous Every 12 hours 09/29/19 0605     10/04/2019 2200  ceFAZolin (ANCEF) IVPB 2g/100 mL premix     2 g 200 mL/hr over 30 Minutes Intravenous Every 8 hours 10/05/2019 1523 09/28/19 1900   09/26/2019 0600  ceFAZolin (ANCEF) IVPB 2g/100 mL premix     2 g 200 mL/hr over 30 Minutes Intravenous On call to O.R. 09/29/2019 0550 10/18/2019 1338   10/01/2019 0558  ceFAZolin (ANCEF) 2-4 GM/100ML-% IVPB    Note to Pharmacy: Marga Melnick   : cabinet override      09/26/2019 0558 10/07/2019 1814      Assessment/Plan: s/p Procedure(s) with comments: WHIPPLE PROCEDURE (N/A) - GENERAL AND EPIDURAL GASTROJEJUNOSTOMY, J TUBE (N/A) - GENERAL AND EPIDURAL Laparoscopy Diagnostic (N/A) Pancreatic cancer - await pathology HCAP - on antibiotics HCAP worse.  PCCM working on diuresing (CVP 18).  Hopefully will not need intubation.   Looks much better after transfusion.   TFs- trickle feeds.  Supp lytes  LOS: 4 days    Ralene Ok 10/01/2019

## 2019-10-01 NOTE — Evaluation (Signed)
Physical Therapy Evaluation Patient Details Name: MARKELLE ZARAGOZA MRN: [id number] DOB: 10-22-1946 Today's Date: 10/01/2019   History of Present Illness  73 yo female [employer]'s Witness developed painless jaundice in May 2020 and found to have pancreatic cancer.  Had ERCP with stent and neoadjuvant chemotherapy complicated by nausea, weight loss, and duodenal obstruction requiring TPN.  Admitted 10/09/2019 for pancreaticoduodenectomy with cholecystectomy with removal of 4 cm from pancreatic head with EBL 650 ml.   Clinical Impression  Pt admitted with above. Pt very anxious but didn't limited activity tolerance. Pt able to don socks while in bed, transfer to EOB and complete std pvt to chair with min/modA. Pt mobility limited by High flow concentrator cords. ANticipate once on regular O2 pt will progress quickly. Acute PT to cont to follow.    Follow Up Recommendations SNF    Equipment Recommendations  None recommended by PT    Recommendations for Other Services       Precautions / Restrictions Precautions Precautions: Fall Restrictions Weight Bearing Restrictions: No      Mobility  Bed Mobility Overal bed mobility: Needs Assistance Bed Mobility: Supine to Sit     Supine to sit: Min assist;+2 for physical assistance     General bed mobility comments: pt initiated transfer, minAto scoot to EOB and elevate trunk  Transfers Overall transfer level: Needs assistance Equipment used: 2 person hand held assist Transfers: Sit to/from [facility] Sit to Stand: Mod assist;+2 physical assistance Stand pivot transfers: Mod assist;+2 physical assistance       General transfer comment: modA to power up and weight shift for stepping sequence  Ambulation/Gait                Stairs            Wheelchair Mobility    Modified Rankin (Stroke Patients Only)       Balance Overall balance assessment: Needs assistance Sitting-balance support: Feet  supported;Bilateral upper extremity supported Sitting balance-Leahy Scale: Fair Sitting balance - Comments: able to maintain upright sitting balance for short period of time   Standing balance support: Bilateral upper extremity supported Standing balance-Leahy Scale: Poor Standing balance comment: pt requires UE support                              Pertinent Vitals/Pain Pain Assessment: Faces Faces Pain Scale: Hurts even more Pain Location: abdomen Pain Descriptors / Indicators: Aching;Constant;Discomfort;Grimacing;Operative site guarding;Sore Pain Intervention(s): Monitored during session    Home Living Family/patient expects to be discharged to:: Private residence Living Arrangements: Alone Available Help at Discharge: Friend(s);Available PRN/intermittently Type of Home: Apartment Home Access: Level entry     Home Layout: One level Home Equipment: Walker - 4 wheels;Grab bars - tub/shower      Prior Function Level of Independence: Independent         Comments: Pt reports living alone, independent for all ADLs, IADLs, and driving     Hand Dominance        Extremity/Trunk Assessment   Upper Extremity Assessment Upper Extremity Assessment: Generalized weakness    Lower Extremity Assessment Lower Extremity Assessment: Generalized weakness(noted swelling)    Cervical / Trunk Assessment Cervical / Trunk Assessment: Kyphotic(s/p abdominal surgery)  Communication   Communication: No difficulties  Cognition Arousal/Alertness: Awake/alert Behavior During Therapy: Anxious Overall Cognitive Status: Within Functional Limits for tasks assessed  General Comments: pt very anxious requiring lots of verbal cues for deep breathing      General Comments General comments (skin integrity, edema, etc.): VSS    Exercises     Assessment/Plan    PT Assessment Patient needs continued PT services  PT Problem List  Decreased strength;Decreased activity tolerance;Decreased balance;Decreased mobility;Decreased knowledge of use of DME       PT Treatment Interventions DME instruction;Gait training;Functional mobility training;Therapeutic activities;Therapeutic exercise;Patient/family education    PT Goals (Current goals can be found in the Care Plan section)  Acute Rehab PT Goals Patient Stated Goal: HOME PT Goal Formulation: With patient Time For Goal Achievement: 10/15/19 Potential to Achieve Goals: Good    Frequency Min 3X/week   Barriers to discharge Decreased caregiver support      Co-evaluation               AM-PAC PT "6 Clicks" Mobility  Outcome Measure Help needed turning from your back to your side while in a flat bed without using bedrails?: A Little Help needed moving from lying on your back to sitting on the side of a flat bed without using bedrails?: A Little Help needed moving to and from a bed to a chair (including a wheelchair)?: A Lot Help needed standing up from a chair using your arms (e.g., wheelchair or bedside chair)?: A Lot Help needed to walk in hospital room?: A Lot Help needed climbing 3-5 steps with a railing? : A Lot 6 Click Score: 14    End of Session Equipment Utilized During Treatment: Oxygen(on high flow concentrator) Activity Tolerance: Patient tolerated treatment well Patient left: in bed;with call bell/phone within reach;with nursing/sitter in room Nurse Communication: Mobility status PT Visit Diagnosis: Difficulty in walking, not elsewhere classified (R26.2);Muscle weakness (generalized) (M62.81)    Time: [id number] PT Time Calculation (min) (ACUTE ONLY): 30 min   Charges:   PT Evaluation $PT Eval Moderate Complexity: 1 Mod PT Treatments $Therapeutic Activity: 8-22 mins        Kittie Plater, PT, DPT Acute Rehabilitation Services Pager #: [phone] Office #: [phone]   Berline Lopes 10/01/2019, 3:16 PM

## 2019-10-01 NOTE — Progress Notes (Signed)
NAME:  Julie Rollins, MRN:  [id number], DOB:  [birth year], LOS: 4 ADMISSION DATE:  10/03/2019, CONSULTATION DATE:  09/28/2019 REFERRING MD:  Dr. Barry Dienes, CCS, CHIEF COMPLAINT:  Hypotension after Whipple   Brief History   73 yo female [place] developed painless jaundice in May 2020 and found to have pancreatic cancer.  Had ERCP with stent and neoadjuvant chemotherapy complicated by nausea, weight loss, and duodenal obstruction requiring TPN.  Admitted 09/29/2019 for pancreaticoduodenectomy with cholecystectomy with removal of 4 cm from pancreatic head with EBL 650 ml.  Developed hypotension around the time of surgery and PCCM asked to assist with management in ICU.  Past Medical History  Breast cancer s/p lumpectomy and XRT, Hypothyroidism, HTN, MRSE bacteremia October 2020  [facility] Events   11/03 pancreaticoduodenectomy with cholecystectomy with removal of 4 cm from pancreatic head with EBL 650 ml 11/05 increased WOB/hypoxia >> add ABx for PNA; Pt agreed to PRBC transfusion  Consults:    Procedures:  CVL 11/03 >> A line 11/03 >>   Significant Diagnostic Tests:  CXR 11/7-reviewed by myself shows mild improvement in multifocal infiltrate  Micro Data:  SARS CoV2 10/30 >> not detected  Antimicrobials:  Vancomycin 11/05 >> Cefepime 11/05 >>   Interim history/subjective:  On high flow oxygen Complaining of nausea Generalized achiness  Objective   Blood pressure (!) 154/79, pulse (!) 111, temperature 98.3 F (36.8 C), temperature source Oral, resp. rate (!) 30, height 5\' 4"  (1.626 m), weight 60.5 kg, SpO2 92 %.    FiO2 (%):  [80 %-100 %] 100 %   Intake/Output Summary (Last 24 hours) at 10/01/2019 0912 Last data filed at 10/01/2019 0800 Gross per 24 hour  Intake 2417.69 ml  Output 2340 ml  Net 77.69 ml   Filed Weights   10/11/2019 0611 09/30/19 0326  Weight: 59.8 kg 60.5 kg    Examination:  General -chronically ill looking, alert, interactive Eyes  -pupils reactive ENT -dry oral mucosa Cardiac -S1-S2 appreciated Chest -rales bilaterally, reduced air entry at the bases Abdomen -soft, drains in place, wound dressing clean Extremities -edema+ Skin -warm, dry Neuro - follows commands, moves extremities  CXR (reviewed by me) -bilateral multifocal infiltrate, right worse than the left, mildly improved on x-ray today 11/7  Resolved Hospital Problem list   Hypotension after surgery  Assessment & Plan:   Acute hypoxemic respiratory failure Healthcare associated pneumonia Continue high flow oxygen May require intubation BiPAP as needed Keep saturations greater than 88%  Healthcare associated pneumonia Day 3 vancomycin and cefepime Continue the same  Anemia Thrombocytopenia Received blood transfusion on 11/5 Had received iron-infed On Aranesp Patient is a Jehovah's Witness  Hypokalemia, hypophosphatemia Trend electrolytes Replete electrolytes as needed  Pancreatic cancer s/p Whipple procedure Protein calorie malnutrition Postoperative care per surgery  Hypothyroidism -Continue Synthroid  Hypoglycemia SSI with Levemir   Significant risk of respiratory decompensation with poor reserves-May require intubation She is tolerating high flow This may be alternated with BiPAP as tolerated  Best practice:  Diet: NPO DVT prophylaxis: SCD GI prophylaxis: Protonix Mobility: bed rest Code Status: Full code Disposition: ICU  Labs    CMP Latest Ref Rng & Units 10/01/2019 09/30/2019 09/30/2019  Glucose 70 - 99 mg/dL 128(H) 104(H) 90  BUN 8 - 23 mg/dL 9 7(L) 5(L)  Creatinine 0.44 - 1.00 mg/dL 0.58 0.59 0.61  Sodium 135 - 145 mmol/L 144 142 145  Potassium 3.5 - 5.1 mmol/L 2.8(L) 2.8(L) 3.0(L)  Chloride 98 - 111 mmol/L 108 105  111  CO2 22 - 32 mmol/L 26 25 26   Calcium 8.9 - 10.3 mg/dL 8.6(L) 8.4(L) 8.5(L)  Total Protein 6.5 - 8.1 g/dL 5.8(L) - 6.3(L)  Total Bilirubin 0.3 - 1.2 mg/dL 1.3(H) - 1.1  Alkaline Phos 38 - 126  U/L 85 - 64  AST 15 - 41 U/L 15 - 18  ALT 0 - 44 U/L 12 - 15   CBC Latest Ref Rng & Units 10/01/2019 09/30/2019 09/29/2019  WBC 4.0 - 10.5 K/uL 12.4(H) 15.3(H) -  Hemoglobin 12.0 - 15.0 g/dL 12.6 12.3 12.4  Hematocrit 36.0 - 46.0 % 36.9 35.5(L) 36.0  Platelets 150 - 400 K/uL 105(L) 109(L) -   ABG    Component Value Date/Time   PHART 7.331 (L) 10/02/2019 1202   PCO2ART 40.4 10/06/2019 1202   PO2ART 455.0 (H) 09/26/2019 1202   HCO3 21.4 10/19/2019 1202   TCO2 23 10/18/2019 1202   ACIDBASEDEF 4.0 (H) 10/14/2019 1202   O2SAT 100.0 10/12/2019 1202   The patient is critically ill with multiple organ systems failure and requires high complexity decision making for assessment and support, frequent evaluation and titration of therapies, application of advanced monitoring technologies and extensive interpretation of multiple databases. Critical Care Time devoted to patient care services described in this note independent of APP/resident time (if applicable)  is 30 minutes.   Sherrilyn Rist MD [facility] Personal pager: [phone] If unanswered, please page CCM On-call: [phone] CC time 33 minutes

## 2019-10-01 NOTE — Progress Notes (Signed)
Occupational Therapy Evaluation Patient Details Name: Julie Rollins MRN: [id number] DOB: [birth year] Today's Date: 10/01/2019    History of Present Illness 73 y.o. female with medical history significant of pancreatic cancer, breast cancer status post lumpectomy and radiation, hypertension, hypothyroidism admitted s/p Whipple procedure.   Clinical Impression   PTA, pt lived alone in one level apartment and was independent for ADLs, IADLs, and driving. Pt currently presents with decreased strength, activity tolerance, balance, and increased pain. Pt requires set up- min A for UB ADLs, and mod A for LB ADLs and sit<>stand, and tolerated standing ~3 minutes. Pt would benefit from continued OT acutely to address deficits and facilitate safe dc. Recommend dc to SNF to increase safe performance of ADLs.     Follow Up Recommendations  SNF    Equipment Recommendations  Other (comment)(defer to next venue)    Recommendations for Other Services PT consult     Precautions / Restrictions Precautions Precautions: Fall Restrictions Weight Bearing Restrictions: No      Mobility Bed Mobility Overal bed mobility: Needs Assistance Bed Mobility: Supine to Sit     Supine to sit: Min assist;+2 for physical assistance     General bed mobility comments: pt sitting in recliner upon arrival  Transfers Overall transfer level: Needs assistance Equipment used: Rolling walker (2 wheeled);1 person hand held assist Transfers: Sit to/from Stand Sit to Stand: Mod assist Stand pivot transfers: Mod assist;+2 physical assistance       General transfer comment: Pt required mod A to power up to stand, cues for hand placement    Balance Overall balance assessment: Needs assistance Sitting-balance support: Feet supported;Bilateral upper extremity supported Sitting balance-Leahy Scale: Fair Sitting balance - Comments: able to maintain upright sitting balance for short period of time   Standing balance  support: Bilateral upper extremity supported Standing balance-Leahy Scale: Poor Standing balance comment: pt requires UE support                            ADL either performed or assessed with clinical judgement   ADL Overall ADL's : Needs assistance/impaired Eating/Feeding: Sitting;Modified independent   Grooming: Set up;Sitting   Upper Body Bathing: Cueing for UE precautions;Min guard   Lower Body Bathing: Sit to/from stand;Maximal assistance   Upper Body Dressing : Min guard;Sitting   Lower Body Dressing: Maximal assistance;Sit to/from stand   Toilet Transfer: Moderate assistance;Ambulation;RW   Toileting- Clothing Manipulation and Hygiene: Moderate assistance;Sit to/from stand         General ADL Comments: Pt required set up- min guard A for UB ADLs, mod A-max A for LB ADLs and functiontal transfers     Vision Baseline Vision/History: Wears glasses Wears Glasses: At all times Patient Visual Report: No change from baseline       Perception     Praxis      Pertinent Vitals/Pain Pain Assessment: Faces Faces Pain Scale: Hurts even more Pain Location: abdomen Pain Descriptors / Indicators: Aching;Constant;Discomfort;Grimacing;Operative site guarding;Sore Pain Intervention(s): Monitored during session;Limited activity within patient's tolerance;Repositioned     Hand Dominance     Extremity/Trunk Assessment Upper Extremity Assessment Upper Extremity Assessment: Generalized weakness   Lower Extremity Assessment Lower Extremity Assessment: Defer to PT evaluation   Cervical / Trunk Assessment Cervical / Trunk Assessment: Kyphotic(s/p abdominal surgery)   Communication Communication Communication: No difficulties   Cognition Arousal/Alertness: Awake/alert Behavior During Therapy: WFL for tasks assessed/performed Overall Cognitive Status: Within Functional Limits for tasks assessed  General Comments:  pt very anxious requiring lots of verbal cues for deep breathing   General Comments  VSS    Exercises Exercises: General Lower Extremity General Exercises - Lower Extremity Hip Flexion/Marching: AROM;Both;5 reps;Standing   Shoulder Instructions      Home Living Family/patient expects to be discharged to:: Private residence Living Arrangements: Alone Available Help at Discharge: Friend(s);Available PRN/intermittently Type of Home: Apartment Home Access: Level entry     Home Layout: One level     Bathroom Shower/Tub: Teacher, early years/pre: Standard     Home Equipment: Walker - 4 wheels;Grab bars - tub/shower          Prior Functioning/Environment Level of Independence: Independent        Comments: Pt reports living alone, independent for all ADLs, IADLs, and driving        OT Problem List: Decreased activity tolerance;Decreased range of motion;Decreased strength;Impaired balance (sitting and/or standing);Decreased safety awareness;Decreased knowledge of use of DME or AE;Decreased knowledge of precautions;Pain      OT Treatment/Interventions: Self-care/ADL training;DME and/or AE instruction;Therapeutic activities;Patient/family education;Balance training    OT Goals(Current goals can be found in the care plan section) Acute Rehab OT Goals Patient Stated Goal: go home OT Goal Formulation: With patient Time For Goal Achievement: 10/15/19 Potential to Achieve Goals: Good  OT Frequency: Min 2X/week   Barriers to D/C:            Co-evaluation              AM-PAC OT "6 Clicks" Daily Activity     Outcome Measure Help from another person eating meals?: None Help from another person taking care of personal grooming?: A Little Help from another person toileting, which includes using toliet, bedpan, or urinal?: A Lot Help from another person bathing (including washing, rinsing, drying)?: A Lot Help from another person to put on and taking off  regular upper body clothing?: A Little Help from another person to put on and taking off regular lower body clothing?: A Lot 6 Click Score: 16   End of Session Equipment Utilized During Treatment: Rolling walker Nurse Communication: Mobility status  Activity Tolerance: Patient tolerated treatment well Patient left: in chair;with call bell/phone within reach  OT Visit Diagnosis: Unsteadiness on feet (R26.81);Muscle weakness (generalized) (M62.81);Pain Pain - part of body: (abdomen)                Time: 1342-1400 OT Time Calculation (min): 18 min Charges:  OT General Charges $OT Visit: 1 Visit OT Evaluation $OT Eval Moderate Complexity: [place], OT Student  Gus Rankin 10/01/2019, 4:16 PM

## 2019-10-02 DIAGNOSIS — J9611 Chronic respiratory failure with hypoxia: Secondary | ICD-10-CM | POA: Diagnosis not present

## 2019-10-02 DIAGNOSIS — C250 Malignant neoplasm of head of pancreas: Secondary | ICD-10-CM | POA: Diagnosis not present

## 2019-10-02 LAB — COMPREHENSIVE METABOLIC PANEL
ALT: 10 U/L (ref 0–44)
AST: 15 U/L (ref 15–41)
Albumin: 2.7 g/dL — ABNORMAL LOW (ref 3.5–5.0)
Alkaline Phosphatase: 110 U/L (ref 38–126)
Anion gap: 10 (ref 5–15)
BUN: 9 mg/dL (ref 8–23)
CO2: 25 mmol/L (ref 22–32)
Calcium: 7.8 mg/dL — ABNORMAL LOW (ref 8.9–10.3)
Chloride: 106 mmol/L (ref 98–111)
Creatinine, Ser: 0.5 mg/dL (ref 0.44–1.00)
GFR calc Af Amer: >60 mL/min (ref >60)
GFR calc non Af Amer: >60 mL/min (ref >60)
Glucose, Bld: 124 mg/dL — ABNORMAL HIGH (ref 70–99)
Potassium: 3.8 mmol/L (ref 3.5–5.1)
Sodium: 141 mmol/L (ref 135–145)
Total Bilirubin: 1.1 mg/dL (ref 0.3–1.2)
Total Protein: 5.3 g/dL — ABNORMAL LOW (ref 6.5–8.1)

## 2019-10-02 LAB — CBC
HCT: 33.4 % — ABNORMAL LOW (ref 36.0–46.0)
Hemoglobin: 11.2 g/dL — ABNORMAL LOW (ref 12.0–15.0)
MCH: 31.5 pg (ref 26.0–34.0)
MCHC: 33.5 g/dL (ref 30.0–36.0)
MCV: 94.1 fL (ref 80.0–100.0)
Platelets: 99 10*3/uL — ABNORMAL LOW (ref 150–400)
RBC: 3.55 MIL/uL — ABNORMAL LOW (ref 3.87–5.11)
RDW: 17.2 % — ABNORMAL HIGH (ref 11.5–15.5)
WBC: 9.9 10*3/uL (ref 4.0–10.5)
nRBC: 0 % (ref 0.0–0.2)

## 2019-10-02 LAB — VANCOMYCIN, PEAK: Vancomycin Pk: 13 ug/mL — ABNORMAL LOW (ref 30–40)

## 2019-10-02 LAB — GLUCOSE, CAPILLARY
Glucose-Capillary: 107 mg/dL — ABNORMAL HIGH (ref 70–99)
Glucose-Capillary: 110 mg/dL — ABNORMAL HIGH (ref 70–99)
Glucose-Capillary: 130 mg/dL — ABNORMAL HIGH (ref 70–99)
Glucose-Capillary: 131 mg/dL — ABNORMAL HIGH (ref 70–99)
Glucose-Capillary: 136 mg/dL — ABNORMAL HIGH (ref 70–99)

## 2019-10-02 LAB — VANCOMYCIN, TROUGH: Vancomycin Tr: 4 ug/mL — ABNORMAL LOW (ref 15–20)

## 2019-10-02 MED ORDER — LACTATED RINGERS IV BOLUS
500.0000 mL | Freq: Once | INTRAVENOUS | Status: AC
  Administered 2019-10-02: 500 mL via INTRAVENOUS

## 2019-10-02 MED ORDER — VANCOMYCIN HCL IN DEXTROSE 1-5 GM/200ML-% IV SOLN
1000.0000 mg | Freq: Two times a day (BID) | INTRAVENOUS | Status: AC
  Administered 2019-10-02 – 2019-10-05 (×7): 1000 mg via INTRAVENOUS
  Filled 2019-10-02 (×8): qty 200

## 2019-10-02 NOTE — Progress Notes (Signed)
NAME:  Julie Rollins, MRN:  [id number], DOB:  08/19/1946, LOS: 5 ADMISSION DATE:  09/28/2019, CONSULTATION DATE:  10/06/2019 REFERRING MD:  Dr. Barry Dienes, CCS, CHIEF COMPLAINT:  Hypotension after Whipple   Brief History   73 yo female [place] developed painless jaundice in May 2020 and found to have pancreatic cancer.  Had ERCP with stent and neoadjuvant chemotherapy complicated by nausea, weight loss, and duodenal obstruction requiring TPN.  Admitted 09/30/2019 for pancreaticoduodenectomy with cholecystectomy with removal of 4 cm from pancreatic head with EBL 650 ml.  Developed hypotension around the time of surgery and PCCM asked to assist with management in ICU.  Past Medical History  Breast cancer s/p lumpectomy and XRT, Hypothyroidism, HTN, MRSE bacteremia October 2020  [facility] Events   11/03 pancreaticoduodenectomy with cholecystectomy with removal of 4 cm from pancreatic head with EBL 650 ml 11/05 increased WOB/hypoxia >> add ABx for PNA; Pt agreed to PRBC transfusion  Consults:    Procedures:  CVL 11/03 >> A line 11/03 >>   Significant Diagnostic Tests:  CXR 11/7-reviewed by myself shows mild improvement in multifocal infiltrate  Micro Data:  SARS CoV2 10/30 >> not detected  Antimicrobials:  Vancomycin 11/05 >> Cefepime 11/05 >>   Interim history/subjective:  On high flow oxygen Complaining of nausea Decreased urine output  Objective   Blood pressure 138/63, pulse (!) 103, temperature 98.4 F (36.9 C), temperature source Oral, resp. rate (!) 21, height 5\' 4"  (1.626 m), weight 60.5 kg, SpO2 95 %.    FiO2 (%):  [80 %-100 %] 80 %   Intake/Output Summary (Last 24 hours) at 10/02/2019 1059 Last data filed at 10/02/2019 0800 Gross per 24 hour  Intake 2541.22 ml  Output 910 ml  Net 1631.22 ml   Filed Weights   10/21/2019 0611 09/30/19 0326  Weight: 59.8 kg 60.5 kg    Examination: General -chronically ill-appearing Eyes -pupils are equal,  reactive ENT -dry oral mucosa Cardiac -S1-S2 appreciated Chest -lateral rales Abdomen -soft, bowel sounds appreciated Extremities-edema Skin -warm and dry Neuro -alert and oriented  CXR (reviewed by me) -multifocal infiltrates with mild improvement.    Resolved Hospital Problem list   Hypotension after surgery  Assessment & Plan:    Acute hypoxemic respiratory failure Healthcare associated pneumonia Continue high flow oxygen BiPAP as needed Keep saturations greater than 88%   Healthcare associated pneumonia D4 vancomycin and cefepime Continue antibiotics  Anemia Thrombocytopenia -On INFeD -On Aranesp -Patient is a Jehovah's Witness  Hypokalemia, hypophosphatemia Being repleted  Pancreatic cancer s/p Whipple procedure Protein calorie malnutrition Postop care per surgery  Hypothyroidism Continue Synthroid  Hypoglycemia SSI with Levemir  Hypokalemia, hypophosphatemia Trend electrolytes Replete electrolytes as needed  Decreased urine output -Challenge with 500 cc LR  Risk of respiratory decompensation remains significant as she remains on high flow oxygen at present She may require intubation We will continue high flow oxygen with alternating BiPAP  Best practice:  Diet: NPO DVT prophylaxis: SCD GI prophylaxis: Protonix Mobility: bed rest Code Status: Full code Disposition: ICU  Labs    CMP Latest Ref Rng & Units 10/02/2019 10/01/2019 09/30/2019  Glucose 70 - 99 mg/dL 124(H) 128(H) 104(H)  BUN 8 - 23 mg/dL 9 9 7(L)  Creatinine 0.44 - 1.00 mg/dL 0.50 0.58 0.59  Sodium 135 - 145 mmol/L 141 144 142  Potassium 3.5 - 5.1 mmol/L 3.8 2.8(L) 2.8(L)  Chloride 98 - 111 mmol/L 106 108 105  CO2 22 - 32 mmol/L 25 26 25  Calcium 8.9 - 10.3 mg/dL 7.8(L) 8.6(L) 8.4(L)  Total Protein 6.5 - 8.1 g/dL 5.3(L) 5.8(L) -  Total Bilirubin 0.3 - 1.2 mg/dL 1.1 1.3(H) -  Alkaline Phos 38 - 126 U/L 110 85 -  AST 15 - 41 U/L 15 15 -  ALT 0 - 44 U/L 10 12 -   CBC Latest  Ref Rng & Units 10/02/2019 10/01/2019 09/30/2019  WBC 4.0 - 10.5 K/uL 9.9 12.4(H) 15.3(H)  Hemoglobin 12.0 - 15.0 g/dL 11.2(L) 12.6 12.3  Hematocrit 36.0 - 46.0 % 33.4(L) 36.9 35.5(L)  Platelets 150 - 400 K/uL 99(L) 105(L) 109(L)   ABG    Component Value Date/Time   PHART 7.331 (L) 10/24/2019 1202   PCO2ART 40.4 09/25/2019 1202   PO2ART 455.0 (H) 10/10/2019 1202   HCO3 21.4 09/26/2019 1202   TCO2 23 10/23/2019 1202   ACIDBASEDEF 4.0 (H) 09/29/2019 1202   O2SAT 100.0 09/30/2019 1202   The patient is critically ill with multiple organ systems failure and requires high complexity decision making for assessment and support, frequent evaluation and titration of therapies, application of advanced monitoring technologies and extensive interpretation of multiple databases. Critical Care Time devoted to patient care services described in this note independent of APP/resident time (if applicable)  is 30 minutes.   Sherrilyn Rist MD [facility] Personal pager: [phone] If unanswered, please page CCM On-call: [phone]

## 2019-10-02 NOTE — Progress Notes (Signed)
Pharmacy Antibiotic Note  Julie Rollins is a 73 y.o. female admitted on 10/03/2019 with s/p whipple procedure.  Pharmacy has been consulted for Vancomycin/Cefepime dosing for possible HCAP. Abnormal CXR.   Vanc 1 gm IV q24hr - AUC 236 based on levels (desired AUC 450 - 550)  Vancomycin 1000 mg IV Q 12 hrs. Goal AUC 400-550. Expected AUC: 472 SCr used: 0.7   Plan: Increase Vancomycin 1000 mg IV q12h Continue Cefepime 2g IV q12h Trend WBC, temp, renal function  F/U infectious work-up Drug levels as indicated  Height: 5\' 4"  (162.6 cm) Weight: 133 lb 6.1 oz (60.5 kg) IBW/kg (Calculated) : 54.7  Temp (24hrs), Avg:98 F (36.7 C), Min:97.6 F (36.4 C), Max:98.4 F (36.9 C)  Recent Labs  Lab 10/18/2019 1534 10/12/2019 1958 09/28/19 0443 09/29/19 0439 09/30/19 0337 09/30/19 1810 10/01/19 0540 10/02/19 0459 10/02/19 0500 10/02/19 1410  WBC  --   --  12.5* 15.5* 15.3*  --  12.4* 9.9  --   --   CREATININE  --   --  0.68 0.73 0.61 0.59 0.58 0.50  --   --   LATICACIDVEN 3.2* 7.2*  --   --   --   --   --   --   --   --   VANCOTROUGH  --   --   --   --   --   --   --   --  4*  --   VANCOPEAK  --   --   --   --   --   --   --   --   --  13*    Estimated Creatinine Clearance: 52.5 mL/min (by C-G formula based on SCr of 0.5 mg/dL).    Allergies  Allergen Reactions  . Codeine Nausea Only   Alanda Slim, PharmD, [facility] Clinical Pharmacist Please see AMION for all Pharmacists' Contact Phone Numbers 10/02/2019, 3:32 PM

## 2019-10-02 NOTE — Progress Notes (Signed)
Wasted 7mL dilaudid in sink with Sheralyn Boatman RN

## 2019-10-02 NOTE — Plan of Care (Signed)
  Problem: Clinical Measurements: Goal: Postoperative complications will be avoided or minimized Outcome: Progressing   

## 2019-10-02 NOTE — Progress Notes (Signed)
Patient ID: Julie Rollins, female   DOB: 1946-02-04, 73 y.o.   MRN: [id number] 5 Days Post-Op   Subjective: FLatus, wants something cold to drink ROS negative except as listed above. Objective: Vital signs in last 24 hours: Temp:  [97.6 F (36.4 C)-98.4 F (36.9 C)] 98.4 F (36.9 C) (11/08 0315) Pulse Rate:  [95-120] 107 (11/08 0814) Resp:  [16-32] 25 (11/08 0814) BP: (117-155)/(58-110) 141/73 (11/08 0814) SpO2:  [85 %-98 %] 93 % (11/08 0814) FiO2 (%):  [80 %-100 %] 80 % (11/08 0814) Last BM Date: 09/08/19  Intake/Output from previous day: 11/07 0701 - 11/08 0700 In: 2775.2 [I.V.:745.3; NG/GT:508.1; IV Piggyback:1521.8] Out: 910 [Urine:775; Drains:135] Intake/Output this shift: No intake/output data recorded.  General appearance: cooperative Resp: some rhoncho Cardio: regular rate and rhythm GI: soft, dry stain on dressing, +BS, drain low  Lab Results: CBC  Recent Labs    10/01/19 0540 10/02/19 0459  WBC 12.4* 9.9  HGB 12.6 11.2*  HCT 36.9 33.4*  PLT 105* 99*   BMET Recent Labs    10/01/19 0540 10/02/19 0459  NA 144 141  K 2.8* 3.8  CL 108 106  CO2 26 25  GLUCOSE 128* 124*  BUN 9 9  CREATININE 0.58 0.50  CALCIUM 8.6* 7.8*   PT/INR No results for input(s): LABPROT, INR in the last 72 hours. ABG No results for input(s): PHART, HCO3 in the last 72 hours.  Invalid input(s): PCO2, PO2  Studies/Results: Dg Chest Port 1 View  Result Date: 10/01/2019 CLINICAL DATA:  Reason for exam: respiratory failure EXAM: PORTABLE CHEST 1 VIEW COMPARISON:  Chest radiograph 09/30/2019 FINDINGS: Stable cardiomediastinal contours. Interval removal of a right upper extremity PICC. A right central venous catheter remains in place with tip terminating at the cavoatrial junction. Persistent extensive airspace opacities throughout the right lung and in the left mid to lower lung. There is increased consolidation at the left base which could represent atelectasis versus is new  infiltrates. No definite pleural effusion. No pneumothorax. No acute finding in the visualized skeleton. IMPRESSION: 1. Persistent extensive airspace disease throughout the right lung and in the left mid to lower lung. 2. Increased consolidation at the left lung base which could represent atelectasis versus new infiltrates. Electronically Signed   By: Audie Pinto M.D.   On: 10/01/2019 10:06    Anti-infectives: Anti-infectives (From admission, onward)   Start     Dose/Rate Route Frequency Ordered Stop   09/29/19 0630  vancomycin (VANCOCIN) IVPB 1000 mg/200 mL premix     1,000 mg 200 mL/hr over 60 Minutes Intravenous Every 24 hours 09/29/19 0625     09/29/19 0615  ceFEPIme (MAXIPIME) 2 g in sodium chloride 0.9 % 100 mL IVPB     2 g 200 mL/hr over 30 Minutes Intravenous Every 12 hours 09/29/19 0605     10/18/2019 2200  ceFAZolin (ANCEF) IVPB 2g/100 mL premix     2 g 200 mL/hr over 30 Minutes Intravenous Every 8 hours 09/29/2019 1523 09/28/19 1900   09/28/2019 0600  ceFAZolin (ANCEF) IVPB 2g/100 mL premix     2 g 200 mL/hr over 30 Minutes Intravenous On call to O.R. 10/18/2019 0550 10/09/2019 1338   10/05/2019 0558  ceFAZolin (ANCEF) 2-4 GM/100ML-% IVPB    Note to Pharmacy: Marga Melnick   : cabinet override      10/13/2019 0558 10/20/2019 1814      Assessment/Plan: s/p Procedure(s): WHIPPLE PROCEDURE GASTROJEJUNOSTOMY, J TUBE Laparoscopy Diagnostic Breathing better Hb 11.2 Passed flatus  so will try clears Appreciate CCM management of PNA  LOS: 5 days    Georganna Skeans, MD, MPH, FACS Trauma & General Surgery Use [email or web address] to contact on call provider  10/02/2019

## 2019-10-03 DIAGNOSIS — Z789 Other specified health status: Secondary | ICD-10-CM

## 2019-10-03 DIAGNOSIS — J189 Pneumonia, unspecified organism: Secondary | ICD-10-CM | POA: Diagnosis not present

## 2019-10-03 DIAGNOSIS — D62 Acute posthemorrhagic anemia: Secondary | ICD-10-CM

## 2019-10-03 DIAGNOSIS — Y95 Nosocomial condition: Secondary | ICD-10-CM

## 2019-10-03 DIAGNOSIS — J9601 Acute respiratory failure with hypoxia: Secondary | ICD-10-CM | POA: Diagnosis not present

## 2019-10-03 DIAGNOSIS — C250 Malignant neoplasm of head of pancreas: Secondary | ICD-10-CM | POA: Diagnosis not present

## 2019-10-03 LAB — BASIC METABOLIC PANEL
Anion gap: 9 (ref 5–15)
BUN: 8 mg/dL (ref 8–23)
CO2: 26 mmol/L (ref 22–32)
Calcium: 8.6 mg/dL — ABNORMAL LOW (ref 8.9–10.3)
Chloride: 105 mmol/L (ref 98–111)
Creatinine, Ser: 0.6 mg/dL (ref 0.44–1.00)
GFR calc Af Amer: >60 mL/min (ref >60)
GFR calc non Af Amer: >60 mL/min (ref >60)
Glucose, Bld: 129 mg/dL — ABNORMAL HIGH (ref 70–99)
Potassium: 3.8 mmol/L (ref 3.5–5.1)
Sodium: 140 mmol/L (ref 135–145)

## 2019-10-03 LAB — CBC
HCT: 36.3 % (ref 36.0–46.0)
Hemoglobin: 12.3 g/dL (ref 12.0–15.0)
MCH: 32 pg (ref 26.0–34.0)
MCHC: 33.9 g/dL (ref 30.0–36.0)
MCV: 94.5 fL (ref 80.0–100.0)
Platelets: 136 10*3/uL — ABNORMAL LOW (ref 150–400)
RBC: 3.84 MIL/uL — ABNORMAL LOW (ref 3.87–5.11)
RDW: 16.7 % — ABNORMAL HIGH (ref 11.5–15.5)
WBC: 11.3 10*3/uL — ABNORMAL HIGH (ref 4.0–10.5)
nRBC: 0 % (ref 0.0–0.2)

## 2019-10-03 LAB — GLUCOSE, CAPILLARY
Glucose-Capillary: 117 mg/dL — ABNORMAL HIGH (ref 70–99)
Glucose-Capillary: 155 mg/dL — ABNORMAL HIGH (ref 70–99)
Glucose-Capillary: 79 mg/dL (ref 70–99)
Glucose-Capillary: 100 mg/dL — ABNORMAL HIGH (ref 70–99)
Glucose-Capillary: 127 mg/dL — ABNORMAL HIGH (ref 70–99)
Glucose-Capillary: 95 mg/dL (ref 70–99)

## 2019-10-03 MED ORDER — HYDROMORPHONE HCL 1 MG/ML IJ SOLN
0.5000 mg | INTRAMUSCULAR | Status: DC | PRN
  Administered 2019-10-03 – 2019-11-04 (×46): 1 mg via INTRAVENOUS
  Filled 2019-10-03 (×50): qty 1

## 2019-10-03 MED ORDER — POTASSIUM CHLORIDE 10 MEQ/50ML IV SOLN
10.0000 meq | INTRAVENOUS | Status: AC
  Administered 2019-10-03 (×3): 10 meq via INTRAVENOUS
  Filled 2019-10-03 (×2): qty 50

## 2019-10-03 NOTE — Progress Notes (Signed)
Physical Therapy Treatment Patient Details Name: Julie Rollins MRN: [id number] DOB: [birth year] Today's Date: 10/03/2019    History of Present Illness 73 y.o. female with medical history significant of pancreatic cancer, breast cancer status post lumpectomy and radiation, hypertension, hypothyroidism admitted s/p Whipple procedure.    PT Comments    Pt continues to be very anxious and self limiting however with max encouragement pt was able to amb 15'x1, and 10'x1 with RW and modAx2 for lines/chair follow. Pts anxiety limits her ability to complete LE there ex in bed and progression of mobility. Con't to recommend SNF upon d/c for maximal functional recovery. Acute PT to continue to follow.    Follow Up Recommendations  SNF     Equipment Recommendations  None recommended by PT    Recommendations for Other Services       Precautions / Restrictions Precautions Precautions: Fall Restrictions Weight Bearing Restrictions: No    Mobility  Bed Mobility Overal bed mobility: Needs Assistance Bed Mobility: Supine to Sit     Supine to sit: Min assist;HOB elevated     General bed mobility comments: Once lines managed pt able to don socks in bed and bring self to EOB with HOB all the way up  Transfers Overall transfer level: Needs assistance Equipment used: Rolling walker (2 wheeled) Transfers: Sit to/from Stand Sit to Stand: Mod assist         General transfer comment: max directional cues for hand placement, max encouragement, verified all the lines and O2 were stable  Ambulation/Gait Ambulation/Gait assistance: Mod assist;+2 safety/equipment(chair follow) Gait Distance (Feet): 15 Feet(x1, 10x1) Assistive device: Rolling walker (2 wheeled) Gait Pattern/deviations: Step-through pattern;Decreased stride length;Wide base of support Gait velocity: slow   General Gait Details: short steps with decreased step height, required max encouragement to keep walking, verbal cues to  stay in walker and not push too far out in front, extremely slow, SPO2 >89% on 15LO2 on NRB   Stairs             Wheelchair Mobility    Modified Rankin (Stroke Patients Only)       Balance Overall balance assessment: Needs assistance Sitting-balance support: Feet supported;No upper extremity supported Sitting balance-Leahy Scale: Fair     Standing balance support: Bilateral upper extremity supported Standing balance-Leahy Scale: Poor Standing balance comment: pt requires UE support                             Cognition Arousal/Alertness: Awake/alert Behavior During Therapy: Anxious Overall Cognitive Status: Within Functional Limits for tasks assessed                                 General Comments: pt very anxious and self limiting, however did everything I asked.       Exercises General Exercises - Lower Extremity Ankle Circles/Pumps: AROM;Both;10 reps;Supine Quad Sets: AROM;Both;10 reps;Supine Heel Slides: AROM;Both;10 reps;Supine    General Comments General comments (skin integrity, edema, etc.): VSS      Pertinent Vitals/Pain Pain Assessment: Faces Faces Pain Scale: Hurts even more Pain Location: abdomen Pain Descriptors / Indicators: Aching;Constant;Discomfort;Grimacing;Operative site guarding;Sore Pain Intervention(s): Monitored during session    Home Living                      Prior Function            PT  Goals (current goals can now be found in the care plan section) Acute Rehab PT Goals Patient Stated Goal: go home Progress towards PT goals: Progressing toward goals    Frequency    Min 3X/week      PT Plan Current plan remains appropriate    Co-evaluation              AM-PAC PT "6 Clicks" Mobility   Outcome Measure  Help needed turning from your back to your side while in a flat bed without using bedrails?: A Little Help needed moving from lying on your back to sitting on the side of a  flat bed without using bedrails?: A Little Help needed moving to and from a bed to a chair (including a wheelchair)?: A Lot Help needed standing up from a chair using your arms (e.g., wheelchair or bedside chair)?: A Lot Help needed to walk in hospital room?: A Lot Help needed climbing 3-5 steps with a railing? : A Lot 6 Click Score: 14    End of Session Equipment Utilized During Treatment: Oxygen Activity Tolerance: Patient tolerated treatment well Patient left: in chair;with call bell/phone within reach;with chair alarm set;with nursing/sitter in room Nurse Communication: Mobility status PT Visit Diagnosis: Difficulty in walking, not elsewhere classified (R26.2);Muscle weakness (generalized) (M62.81)     Time: [id number] PT Time Calculation (min) (ACUTE ONLY): 42 min  Charges:  $Gait Training: 8-22 mins $Therapeutic Exercise: 8-22 mins $Therapeutic Activity: 8-22 mins                     Kittie Plater, PT, DPT Acute Rehabilitation Services Pager #: [phone] Office #: [phone]    Berline Lopes 10/03/2019, 12:35 PM

## 2019-10-03 NOTE — Progress Notes (Signed)
Pt taken off [place] 15L/30% and placed on regular HFNC (salter).  Pt is on 4L [place], Sp02 of 96% and tolerating well.  Will continue to monitor.

## 2019-10-03 NOTE — Progress Notes (Signed)
Patient ID: Julie Rollins, female   DOB: August 01, 1946, 73 y.o.   MRN: [id number] 6 Days Post-Op   Subjective: Having some nausea, but is passing gas.  Using minimal on the PCA.     Objective: Vital signs in last 24 hours: Temp:  [97.7 F (36.5 C)-98.3 F (36.8 C)] 97.9 F (36.6 C) (11/09 1200) Pulse Rate:  [101-116] 106 (11/09 1600) Resp:  [14-27] 18 (11/09 1600) BP: (127-151)/(62-113) 150/65 (11/09 1600) SpO2:  [87 %-97 %] 91 % (11/09 1600) FiO2 (%):  [30 %-70 %] 30 % (11/09 1515) Weight:  [64.5 kg] 64.5 kg (11/09 0500) Last BM Date: 09/08/19  Intake/Output from previous day: 11/08 0701 - 11/09 0700 In: 2868.4 [P.O.:240; I.V.:1300.8; NG/GT:480; IV Piggyback:847.7] Out: 1600 [Urine:1400; Drains:200] Intake/Output this shift: Total I/O In: 741 [I.V.:456.5; Other:40; NG/GT:40; IV Piggyback:204.6] Out: 480 [Urine:350; Drains:130]  General appearance: cooperative Resp: gets SOB when talking for a bit.   Cardio: regular rate and rhythm GI: soft, dry stain on dressing, +BS, drain low  Lab Results: CBC  Recent Labs    10/02/19 0459 10/03/19 0546  WBC 9.9 11.3*  HGB 11.2* 12.3  HCT 33.4* 36.3  PLT 99* 136*   BMET Recent Labs    10/02/19 0459 10/03/19 0546  NA 141 140  K 3.8 3.8  CL 106 105  CO2 25 26  GLUCOSE 124* 129*  BUN 9 8  CREATININE 0.50 0.60  CALCIUM 7.8* 8.6*   PT/INR No results for input(s): LABPROT, INR in the last 72 hours. ABG No results for input(s): PHART, HCO3 in the last 72 hours.  Invalid input(s): PCO2, PO2  Studies/Results: No results found.  Anti-infectives: Anti-infectives (From admission, onward)   Start     Dose/Rate Route Frequency Ordered Stop   10/02/19 1858  vancomycin (VANCOCIN) IVPB 1000 mg/200 mL premix     1,000 mg 200 mL/hr over 60 Minutes Intravenous Every 12 hours 10/02/19 1533     09/29/19 0630  vancomycin (VANCOCIN) IVPB 1000 mg/200 mL premix  Status:  Discontinued     1,000 mg 200 mL/hr over 60 Minutes  Intravenous Every 24 hours 09/29/19 0625 10/02/19 1533   09/29/19 0615  ceFEPIme (MAXIPIME) 2 g in sodium chloride 0.9 % 100 mL IVPB     2 g 200 mL/hr over 30 Minutes Intravenous Every 12 hours 09/29/19 0605     10/06/2019 2200  ceFAZolin (ANCEF) IVPB 2g/100 mL premix     2 g 200 mL/hr over 30 Minutes Intravenous Every 8 hours 10/22/2019 1523 09/28/19 1900   10/10/2019 0600  ceFAZolin (ANCEF) IVPB 2g/100 mL premix     2 g 200 mL/hr over 30 Minutes Intravenous On call to O.R. 10/12/2019 0550 10/20/2019 1338   10/10/2019 0558  ceFAZolin (ANCEF) 2-4 GM/100ML-% IVPB    Note to Pharmacy: Marga Melnick   : cabinet override      10/11/2019 0558 10/03/2019 1814      Assessment/Plan: s/p Procedure(s): WHIPPLE PROCEDURE GASTROJEJUNOSTOMY, J TUBE 11/3 for adenocarcinoma of the pancreas, s/p neoadjuvant chemo and neoadjuvant SBRT.    ABL anemia stable Bloating - hold tube feeds.  Small amounts of full liquids as tolerated. D/c pca and foley Pneumonia- patient on High flow [place], desats when off.  On broad spectrum antibiotics.    Will get drain #2 out.     LOS: 6 days    Milus Height, MD FACS Surgical Oncology, General Surgery, Trauma and [place] Surgery, [place] [phone] Check [email or web address] for  coverage night/weekend   10/03/2019

## 2019-10-03 NOTE — Progress Notes (Signed)
Pt currently off BiPAP with a saturation of 93%. RT will continue to monitor.

## 2019-10-03 NOTE — Progress Notes (Signed)
NAME:  Julie Rollins, MRN:  [id number], DOB:  1946/08/03, LOS: 77 ADMISSION DATE:  10/13/2019, CONSULTATION DATE:  09/26/2019 REFERRING MD:  Dr. Barry Dienes, CCS, CHIEF COMPLAINT:  Hypotension after Whipple   Brief History   73 yo female [place] developed painless jaundice in May 2020 and found to have pancreatic cancer.  Had ERCP with stent and neoadjuvant chemotherapy complicated by nausea, weight loss, and duodenal obstruction requiring TPN.  Admitted 10/09/2019 for pancreaticoduodenectomy with cholecystectomy with removal of 4 cm from pancreatic head with EBL 650 ml.  Developed hypotension around the time of surgery and PCCM asked to assist with management in ICU.  Past Medical History  Breast cancer s/p lumpectomy and XRT, Hypothyroidism, HTN, MRSE bacteremia October 2020 pancreatic cancer, s/p neoadjuvant chemo  Significant Hospital Events   11/03 pancreaticoduodenectomy with cholecystectomy with removal of 4 cm from pancreatic head with EBL 650 ml 11/05 increased WOB/hypoxia >> add ABx for PNA; Pt agreed to PRBC transfusion  Consults:  PCCM  Procedures:  CVL 11/03 >> A line 11/03 >>   Significant Diagnostic Tests:  CXR 11/7-reviewed by myself shows mild improvement in multifocal infiltrate  Micro Data:  SARS CoV2 10/30 >> not detected No blood or sputum cultures  Antimicrobials:  Vancomycin 11/05 >> vanc trough 4, peak 13 on 11/8 Cefepime 11/05 >>   Interim history/subjective:  Continues on HFNC Denies SOB this morning. Mild abd pain. Tolerating clears, some baseline nausea but not worse with eating.  Objective   Blood pressure (!) 145/76, pulse (!) 109, temperature 97.9 F (36.6 C), temperature source Oral, resp. rate 16, height 5\' 4"  (1.626 m), weight 64.5 kg, SpO2 95 %.    FiO2 (%):  [50 %-80 %] 50 %   Intake/Output Summary (Last 24 hours) at 10/03/2019 1250 Last data filed at 10/03/2019 1200 Gross per 24 hour  Intake 2408.68 ml  Output 2080 ml  Net 328.68  ml   Filed Weights   10/07/2019 0611 09/30/19 0326 10/03/19 0500  Weight: 59.8 kg 60.5 kg 64.5 kg    Examination: General -cachectic, chronically ill-appearing elderly woman sitting up in bed in no acute distress HEENT-[place]/AT, eyes anicteric Cardiac -S1-S2, mild tachycardia, regular rhythm Resp-rales bilaterally, tachypnea, saturating 86 to 90% on 30 L, 60% FiO2.  Lower saturations when talking. Abdomen -soft, hypoactive bowel sounds.  No guarding or significant TTP Extremities-lower extremity dependent edema, no upper extremity edema Skin -warm and well perfused, no rashes Neuro -alert, moving all extremities spontaneously, attempting to reposition her in bed independently, comprehension appears intact, normal speech   Resolved Hospital Problem list   Hypotension after surgery  Assessment & Plan:    Acute hypoxemic respiratory failure, HAP- possible aspiration pneumonia. TRALI seems less likely given her ongoing high oxygen requirements.  Concerned that pulmonary edema is likely worsening her hypoxic respiratory failure.  Net +6.6 L for the admission. -Continue high flow nasal cannula; titrate up if sustained SPO2 less than 88% -Requires ongoing careful monitoring in the ICU -Monitor closely for signs of aspiration  HAP; no cultures available -D5 vancomycin and cefepime  Anemia & Thrombocytopenia; Jehovah's Witness, but previously accepted blood transfusion on 11/5 postoperatively -Continue darbepoetin alfa injections weekly. Previously completed 5 days of iron dextran infusions. -Would verify with patient her willingness to accept future transfusions before doing them if possible  Pancreatic cancer s/p Whipple procedure with chronic protein calorie malnutrition -Postop care per surgery's recommendations.  Tolerating clear liquid diet.  Hypothyroidism -Continue Synthroid  Prediabetes-PTA A1c 5.7.  Hyper & hypoglycemia this admission. -Continue 4 times daily Accu-Checks with  sliding scale insulin as needed -Continue Levemir 5 units daily  Hypokalemia, hypophosphatemia -Continue to trend electrolytes and replete as needed  Decreased urine output -May require diuresis at some point    Best practice:  Diet: clears per surgery DVT prophylaxis: SCD, likely will be stable for chemical DVT prophylaxis soon GI prophylaxis: Protonix Mobility: bed rest Code Status: Full code Disposition: ICU  Labs    CMP Latest Ref Rng & Units 10/03/2019 10/02/2019 10/01/2019  Glucose 70 - 99 mg/dL 129(H) 124(H) 128(H)  BUN 8 - 23 mg/dL 8 9 9   Creatinine 0.44 - 1.00 mg/dL 0.60 0.50 0.58  Sodium 135 - 145 mmol/L 140 141 144  Potassium 3.5 - 5.1 mmol/L 3.8 3.8 2.8(L)  Chloride 98 - 111 mmol/L 105 106 108  CO2 22 - 32 mmol/L 26 25 26   Calcium 8.9 - 10.3 mg/dL 8.6(L) 7.8(L) 8.6(L)  Total Protein 6.5 - 8.1 g/dL - 5.3(L) 5.8(L)  Total Bilirubin 0.3 - 1.2 mg/dL - 1.1 1.3(H)  Alkaline Phos 38 - 126 U/L - 110 85  AST 15 - 41 U/L - 15 15  ALT 0 - 44 U/L - 10 12   CBC Latest Ref Rng & Units 10/03/2019 10/02/2019 10/01/2019  WBC 4.0 - 10.5 K/uL 11.3(H) 9.9 12.4(H)  Hemoglobin 12.0 - 15.0 g/dL 12.3 11.2(L) 12.6  Hematocrit 36.0 - 46.0 % 36.3 33.4(L) 36.9  Platelets 150 - 400 K/uL 136(L) 99(L) 105(L)   ABG    Component Value Date/Time   PHART 7.331 (L) 09/25/2019 1202   PCO2ART 40.4 10/02/2019 1202   PO2ART 455.0 (H) 10/18/2019 1202   HCO3 21.4 09/26/2019 1202   TCO2 23 09/29/2019 1202   ACIDBASEDEF 4.0 (H) 10/05/2019 1202   O2SAT 100.0 10/19/2019 1202   This patient is critically ill with multiple organ system failure which requires frequent high complexity decision making, assessment, support, evaluation, and titration of therapies. This was completed through the application of advanced monitoring technologies and extensive interpretation of multiple databases. During this encounter critical care time was devoted to patient care services described in this note for 35 minutes.   Julian Hy, DO 10/03/19 1:17 PM [facility]

## 2019-10-03 NOTE — Progress Notes (Signed)
Wasted 25 mL of dilaudid with Buck Mam RN

## 2019-10-04 ENCOUNTER — Inpatient Hospital Stay (HOSPITAL_COMMUNITY): Payer: Medicare HMO

## 2019-10-04 DIAGNOSIS — J9601 Acute respiratory failure with hypoxia: Secondary | ICD-10-CM | POA: Diagnosis not present

## 2019-10-04 DIAGNOSIS — R609 Edema, unspecified: Secondary | ICD-10-CM | POA: Diagnosis not present

## 2019-10-04 DIAGNOSIS — J690 Pneumonitis due to inhalation of food and vomit: Secondary | ICD-10-CM

## 2019-10-04 LAB — BASIC METABOLIC PANEL
Anion gap: 9 (ref 5–15)
BUN: 10 mg/dL (ref 8–23)
CO2: 25 mmol/L (ref 22–32)
Calcium: 8.7 mg/dL — ABNORMAL LOW (ref 8.9–10.3)
Chloride: 106 mmol/L (ref 98–111)
Creatinine, Ser: 0.5 mg/dL (ref 0.44–1.00)
GFR calc Af Amer: >60 mL/min (ref >60)
GFR calc non Af Amer: >60 mL/min (ref >60)
Glucose, Bld: 74 mg/dL (ref 70–99)
Potassium: 3.5 mmol/L (ref 3.5–5.1)
Sodium: 140 mmol/L (ref 135–145)

## 2019-10-04 LAB — GLUCOSE, CAPILLARY
Glucose-Capillary: 84 mg/dL (ref 70–99)
Glucose-Capillary: 102 mg/dL — ABNORMAL HIGH (ref 70–99)
Glucose-Capillary: 148 mg/dL — ABNORMAL HIGH (ref 70–99)
Glucose-Capillary: 80 mg/dL (ref 70–99)
Glucose-Capillary: 174 mg/dL — ABNORMAL HIGH (ref 70–99)

## 2019-10-04 LAB — MAGNESIUM: Magnesium: 2.3 mg/dL (ref 1.7–2.4)

## 2019-10-04 LAB — SURGICAL PATHOLOGY

## 2019-10-04 LAB — PHOSPHORUS: Phosphorus: 2.6 mg/dL (ref 2.5–4.6)

## 2019-10-04 MED ORDER — METOCLOPRAMIDE HCL 5 MG/ML IJ SOLN
5.0000 mg | Freq: Three times a day (TID) | INTRAMUSCULAR | Status: DC
  Administered 2019-10-04 – 2019-10-06 (×8): 5 mg via INTRAVENOUS
  Filled 2019-10-04 (×8): qty 2

## 2019-10-04 MED ORDER — FUROSEMIDE 10 MG/ML IJ SOLN
20.0000 mg | Freq: Once | INTRAMUSCULAR | Status: AC
  Administered 2019-10-04: 10:00:00 20 mg via INTRAVENOUS
  Filled 2019-10-04: qty 2

## 2019-10-04 MED ORDER — ENOXAPARIN SODIUM 40 MG/0.4ML ~~LOC~~ SOLN
40.0000 mg | SUBCUTANEOUS | Status: DC
  Administered 2019-10-04 – 2019-10-31 (×27): 40 mg via SUBCUTANEOUS
  Filled 2019-10-04 (×28): qty 0.4

## 2019-10-04 NOTE — Progress Notes (Addendum)
Pt moving to progressive care unit 4N, room 4N14. Bed now available. Report given to RN Paulette. Pt to be moved via bed with her belongings.   At 1728 pt given zofran for nausea and dilaudid for pain prior to transfer over to 4North.

## 2019-10-04 NOTE — Progress Notes (Signed)
Patient ID: Julie Rollins, female   DOB: [birth year], 73 y.o.   MRN: [id number] 7 Days Post-Op   Subjective: Doing Ok since PCA off.  Still having nausea.  Required I&O catheter last night.  Complained of leg cramping and pain to NP for PCCM.     Objective: Vital signs in last 24 hours: Temp:  [97.7 F (36.5 C)-98.2 F (36.8 C)] 97.7 F (36.5 C) (11/10 0800) Pulse Rate:  [94-113] 98 (11/10 0800) Resp:  [13-27] 17 (11/10 0800) BP: (112-161)/(61-92) 113/92 (11/10 0800) SpO2:  [89 %-97 %] 95 % (11/10 0800) FiO2 (%):  [30 %-50 %] 30 % (11/09 1515) Weight:  [64.5 kg] 64.5 kg (11/10 0500) Last BM Date: 09/08/19  Intake/Output from previous day: 11/09 0701 - 11/10 0700 In: 1677.8 [I.V.:1093; NG/GT:40; IV Piggyback:504.8] Out: 1270 [Urine:950; Drains:320] Intake/Output this shift: Total I/O In: 200 [P.O.:200] Out: 50 [Drains:50]  General appearance: cooperative, but a bit upset about feeling the need to have BM.   Resp: gets SOB when talking for a bit.   Cardio: regular rate and rhythm GI: soft, dry stain on dressing, +BS, drain serosang.    Lab Results: CBC  Recent Labs    10/02/19 0459 10/03/19 0546  WBC 9.9 11.3*  HGB 11.2* 12.3  HCT 33.4* 36.3  PLT 99* 136*   BMET Recent Labs    10/03/19 0546 10/04/19 0500  NA 140 140  K 3.8 3.5  CL 105 106  CO2 26 25  GLUCOSE 129* 74  BUN 8 10  CREATININE 0.60 0.50  CALCIUM 8.6* 8.7*   PT/INR No results for input(s): LABPROT, INR in the last 72 hours. ABG No results for input(s): PHART, HCO3 in the last 72 hours.  Invalid input(s): PCO2, PO2  Studies/Results: No results found.  Anti-infectives: Anti-infectives (From admission, onward)   Start     Dose/Rate Route Frequency Ordered Stop   10/02/19 1858  vancomycin (VANCOCIN) IVPB 1000 mg/200 mL premix     1,000 mg 200 mL/hr over 60 Minutes Intravenous Every 12 hours 10/02/19 1533     09/29/19 0630  vancomycin (VANCOCIN) IVPB 1000 mg/200 mL premix  Status:   Discontinued     1,000 mg 200 mL/hr over 60 Minutes Intravenous Every 24 hours 09/29/19 0625 10/02/19 1533   09/29/19 0615  ceFEPIme (MAXIPIME) 2 g in sodium chloride 0.9 % 100 mL IVPB     2 g 200 mL/hr over 30 Minutes Intravenous Every 12 hours 09/29/19 0605     10/03/2019 2200  ceFAZolin (ANCEF) IVPB 2g/100 mL premix     2 g 200 mL/hr over 30 Minutes Intravenous Every 8 hours 10/18/2019 1523 09/28/19 1900   10/05/2019 0600  ceFAZolin (ANCEF) IVPB 2g/100 mL premix     2 g 200 mL/hr over 30 Minutes Intravenous On call to O.R. 10/13/2019 0550 10/18/2019 1338   10/23/2019 0558  ceFAZolin (ANCEF) 2-4 GM/100ML-% IVPB    Note to Pharmacy: Marga Melnick   : cabinet override      10/02/2019 0558 10/20/2019 1814      Assessment/Plan: s/p Procedure(s): WHIPPLE PROCEDURE GASTROJEJUNOSTOMY, J TUBE 11/3 for adenocarcinoma of the pancreas, s/p neoadjuvant chemo and neoadjuvant SBRT.    Pathology still pending.   ABL anemia stable Start lovenox Restart tube feeds.   Add reglan for nausea. Pain control adequate Pneumonia- antibiotics per PCCM.  Decreased oxygen requirements.  On broad spectrum antibiotics.    .     LOS: 7 days    Legrand Lasser L  Barry Dienes, MD FACS Surgical Oncology, General Surgery, Trauma and [facility], [place] [phone] Check [email or web address] for coverage night/weekend   10/04/2019

## 2019-10-04 NOTE — Evaluation (Signed)
Physical Therapy Evaluation Patient Details Name: Julie Rollins MRN: [id number] DOB: 01/25/1946 Today's Date: 10/04/2019   History of Present Illness  73 y.o. female with medical history significant of pancreatic cancer, breast cancer status post lumpectomy and radiation, hypertension, hypothyroidism admitted s/p Whipple procedure.  Clinical Impression  Pt con't to require max encouragement to progress mobility as pt remains very anxious however pt continues to work with therapy and we were able to progress amb to 70'x1 with RW with less assist today. Pt also worked with OT on ADLs in bathroom. Pt was up and out of bed for 40 min of activity. Acute PT to cont to follow.     Follow Up Recommendations SNF    Equipment Recommendations  None recommended by PT    Recommendations for Other Services       Precautions / Restrictions Precautions Precautions: Fall Restrictions Weight Bearing Restrictions: No      Mobility  Bed Mobility Overal bed mobility: Needs Assistance Bed Mobility: Supine to Sit;Sit to Supine     Supine to sit: HOB elevated;Supervision Sit to supine: Min assist;HOB elevated   General bed mobility comments: pt requires mod (A) and HOB all the way up to progress to static sitting. Pt requires min (A) for bil LE to return to supine. pt able to bridge buttocks  Transfers Overall transfer level: Needs assistance Equipment used: Rolling walker (2 wheeled) Transfers: Sit to/from Stand Sit to Stand: Min assist         General transfer comment: Pt requires bil UE to push up from surface to static stand with RW, v/c's for safe hand placement  Ambulation/Gait Ambulation/Gait assistance: +2 safety/equipment;Min assist Gait Distance (Feet): 20 Feet(x1, 60x1) Assistive device: Rolling walker (2 wheeled) Gait Pattern/deviations: Step-through pattern;Decreased stride length;Wide base of support     General Gait Details: pt with increased step length and cadence  compared to yesterdays session, max encouragement to increase ambulation tolerance  Stairs            Wheelchair Mobility    Modified Rankin (Stroke Patients Only)       Balance Overall balance assessment: Needs assistance Sitting-balance support: Bilateral upper extremity supported;Feet supported Sitting balance-Leahy Scale: Fair Sitting balance - Comments: able to maintain upright sitting balance for short period of time   Standing balance support: During functional activity;Single extremity supported Standing balance-Leahy Scale: Fair Standing balance comment: pt worked with OT at sink doing ADLs, pt SpO2 down to 85% on RA, returned to 90s once placed on 4Lo2 via [place]                             Pertinent Vitals/Pain Pain Assessment: Faces Faces Pain Scale: Hurts little more Pain Location: abdomen/ tired Pain Descriptors / Indicators: Discomfort;Operative site guarding Pain Intervention(s): Monitored during session    Home Living                        Prior Function                 Hand Dominance        Extremity/Trunk Assessment   Upper Extremity Assessment Upper Extremity Assessment: Generalized weakness    Lower Extremity Assessment Lower Extremity Assessment: Defer to PT evaluation       Communication      Cognition Arousal/Alertness: Awake/alert Behavior During Therapy: Anxious Overall Cognitive Status: Within Functional Limits for tasks assessed  General Comments: pt continuous to be anxious however follows all commands and pariticpates in therapy      General Comments General comments (skin integrity, edema, etc.): required 4Lo2 via [place]    Exercises     Assessment/Plan    PT Assessment    PT Problem List         PT Treatment Interventions      PT Goals (Current goals can be found in the Care Plan section)  Acute Rehab PT Goals Patient Stated Goal: go  home    Frequency Min 3X/week   Barriers to discharge        Co-evaluation PT/OT/SLP Co-Evaluation/Treatment: Yes Reason for Co-Treatment: To address functional/ADL transfers PT goals addressed during session: Mobility/safety with mobility         AM-PAC PT "6 Clicks" Mobility  Outcome Measure Help needed turning from your back to your side while in a flat bed without using bedrails?: A Little Help needed moving from lying on your back to sitting on the side of a flat bed without using bedrails?: A Little Help needed moving to and from a bed to a chair (including a wheelchair)?: A Little Help needed standing up from a chair using your arms (e.g., wheelchair or bedside chair)?: A Little Help needed to walk in hospital room?: A Lot Help needed climbing 3-5 steps with a railing? : A Lot 6 Click Score: 16    End of Session Equipment Utilized During Treatment: Oxygen Activity Tolerance: Patient tolerated treatment well Patient left: with call bell/phone within reach;in bed Nurse Communication: Mobility status PT Visit Diagnosis: Difficulty in walking, not elsewhere classified (R26.2);Muscle weakness (generalized) (M62.81)    Time: [id number] PT Time Calculation (min) (ACUTE ONLY): 44 min   Charges:     PT Treatments $Gait Training: 8-22 mins $Therapeutic Activity: 8-22 mins        Kittie Plater, PT, DPT Acute Rehabilitation Services Pager #: [phone] Office #: [phone]   Berline Lopes 10/04/2019, 1:41 PM

## 2019-10-04 NOTE — Progress Notes (Signed)
NAME:  Julie Rollins, MRN:  [id number], DOB:  05-28-1946, LOS: 7 ADMISSION DATE:  10/14/2019, CONSULTATION DATE:  10/11/2019 REFERRING MD:  Dr. Barry Dienes, CCS, CHIEF COMPLAINT:  Hypotension after Whipple   Brief History   73 yo female [place] developed painless jaundice in May 2020 and found to have pancreatic cancer.  Had ERCP with stent and neoadjuvant chemotherapy complicated by nausea, weight loss, and duodenal obstruction requiring TPN.  Admitted 10/01/2019 for pancreaticoduodenectomy with cholecystectomy with removal of 4 cm from pancreatic head with EBL 650 ml.  Developed hypotension around the time of surgery and PCCM asked to assist with management in ICU.  Past Medical History  Breast cancer s/p lumpectomy and XRT, Hypothyroidism, HTN, MRSE bacteremia October 2020 pancreatic cancer, s/p neoadjuvant chemo  Significant Hospital Events   11/03 pancreaticoduodenectomy with cholecystectomy with removal of 4 cm from pancreatic head with EBL 650 ml 11/05 increased WOB/hypoxia >> add ABx for PNA; Pt agreed to PRBC transfusion 11/10 tolerating 4LNC   Consults:  PCCM  Procedures:  CVL 11/03 >>  Significant Diagnostic Tests:  CXR 11/7-reviewed by myself shows mild improvement in multifocal infiltrate  Micro Data:  SARS CoV2 10/30 >> not detected 11/5 expectorated sputum sent   Antimicrobials:  Vancomycin 11/05 >> vanc trough 4, peak 13 on 11/8 Cefepime 11/05 >>   Interim history/subjective:  4LNC this morning  Remains afebrile No CBC this morning  Complaining of BLE cramping pain   Objective   Blood pressure (!) 113/92, pulse 98, temperature 98.1 F (36.7 C), temperature source Oral, resp. rate 17, height 5\' 4"  (1.626 m), weight 64.5 kg, SpO2 95 %.    FiO2 (%):  [30 %-50 %] 30 %   Intake/Output Summary (Last 24 hours) at 10/04/2019 0832 Last data filed at 10/04/2019 0800 Gross per 24 hour  Intake 1759.1 ml  Output 1320 ml  Net 439.1 ml   Filed Weights   09/30/19 0326 10/03/19 0500 10/04/19 0500  Weight: 60.5 kg 64.5 kg 64.5 kg    Examination: General - Chronically ill appearing older adult F, NAD reclined in bed  HEENT- NCAT. Anicteric sclera. R IJ CVC. Patent nares with 4LNC  Cardiac - s1s2 no rgm. Cap refill < 3 seconds  Resp- Scattered rhonchi. Symmetrical chest expansion, shallow unlabored respirations.  Abdomen - soft. Non-distended. No guarding.  Extremities- BLE edema, no erythema. No cyanosis or clubbing Skin - clean, dry, warm, without rash  Neuro- AAOx4. Following commands Psych- tearful, making eye contact, appropriate insight for age and situation    Resolved Hospital Problem list   Hypotension after surgery  Assessment & Plan:   Acute hypoxemic respiratory failure - HAP, concern for aspiration -pulmonary edema (remains net positive this admission - possible component of atelectasis  -CXR with persistent ASD P -Titrate supplemental O2 for SpO2 > 88% -Vanc, cefepime for HAP (11/10 is day 6-- anticipate likely coming to end of abx course. Will plan for 7 day course unless patient worsens clinically)  -AM CBC -AM CXR -frequent IS for possible atelectatic component   -mobilization would likely be of benefit from pulm perspective  -Will give 1x low-dose IV lasix   Anemia & Thrombocytopenia; Jehovah's Witness - but previously accepted blood transfusion on 11/5 postoperatively -s/p 5 day course of iron infusions  P -weekly aranesp -if transfusion indication arises, would specifically discuss with patient. poist  Pancreatic cancer s/p Whipple procedure with chronic protein calorie malnutrition -Postop care per surgery's recommendations.  Tolerating clear liquid diet.  Hypothyroidism -Continue Synthroid  Prediabetes-PTA A1c 5.7.  Hyper & hypoglycemia this admission. -SSI, levemir   Decreased urine output -net positive, appears volume overloaded Plan -Will give 1x low-dose lasix  -I/O per unit protocol    BLE cramping pain -BLE edema, no erythema. Suspect edema is reflective of volume status however in setting of recent surgery, decreased mobility, SCD only, must also consider possibility of dvt P -BLE dopplers -d/w primary -- VTE ppx to start today   Best practice:  Diet: per surg DVT prophylaxis: lovenox  GI prophylaxis: Protonix Mobility: PT Code Status: Full code Disposition: In ICU at present, dispo per primary team however from pulm perspective do anticipate approaching readiness for transfer out of unit   Labs    CMP Latest Ref Rng & Units 10/04/2019 10/03/2019 10/02/2019  Glucose 70 - 99 mg/dL 74 129(H) 124(H)  BUN 8 - 23 mg/dL 10 8 9   Creatinine 0.44 - 1.00 mg/dL 0.50 0.60 0.50  Sodium 135 - 145 mmol/L 140 140 141  Potassium 3.5 - 5.1 mmol/L 3.5 3.8 3.8  Chloride 98 - 111 mmol/L 106 105 106  CO2 22 - 32 mmol/L 25 26 25   Calcium 8.9 - 10.3 mg/dL 8.7(L) 8.6(L) 7.8(L)  Total Protein 6.5 - 8.1 g/dL - - 5.3(L)  Total Bilirubin 0.3 - 1.2 mg/dL - - 1.1  Alkaline Phos 38 - 126 U/L - - 110  AST 15 - 41 U/L - - 15  ALT 0 - 44 U/L - - 10   CBC Latest Ref Rng & Units 10/03/2019 10/02/2019 10/01/2019  WBC 4.0 - 10.5 K/uL 11.3(H) 9.9 12.4(H)  Hemoglobin 12.0 - 15.0 g/dL 12.3 11.2(L) 12.6  Hematocrit 36.0 - 46.0 % 36.3 33.4(L) 36.9  Platelets 150 - 400 K/uL 136(L) 99(L) 105(L)   ABG    Component Value Date/Time   PHART 7.331 (L) 10/13/2019 1202   PCO2ART 40.4 10/08/2019 1202   PO2ART 455.0 (H) 10/15/2019 1202   HCO3 21.4 10/11/2019 1202   TCO2 23 10/23/2019 1202   ACIDBASEDEF 4.0 (H) 10/18/2019 1202   O2SAT 100.0 10/04/2019 [place] MSN, AGACNP-BC [place] [id number] If no answer, [id number] 10/04/2019, 9:36 AM

## 2019-10-04 NOTE — Progress Notes (Signed)
Occupational Therapy Treatment Patient Details Name: Julie Rollins MRN: [id number] DOB: 01-Oct-1946 Today's Date: 10/04/2019    History of present illness 72 y.o. female with medical history significant of pancreatic cancer, breast cancer status post lumpectomy and radiation, hypertension, hypothyroidism admitted s/p Whipple procedure.   OT comments  Pt requires 4 L O2 to progress to sink level standing grooming task with min guard (A). Pt benefits from minimal discussion and direct request to initiate task and participate despite her discomfort and anxiety. Pt pleased with session and reports feeling better for engaging in task. Pt likes to have spare straws in her purse for any drinks that could be delivered and this is a great reward item for her upon completion of session.    Follow Up Recommendations  SNF(asked about CIR but states "i need longer than 10 days")    Equipment Recommendations  3 in 1 bedside commode;Hospital bed    Recommendations for Other Services      Precautions / Restrictions Precautions Precautions: Fall       Mobility Bed Mobility Overal bed mobility: Needs Assistance Bed Mobility: Supine to Sit;Sit to Supine     Supine to sit: HOB elevated;Supervision Sit to supine: Min assist;HOB elevated   General bed mobility comments: pt requires mod (A) and HOB all the way up to progress to static sitting. Pt requires min (A) for bil LE to return to supine. pt able to bridge buttocks  Transfers Overall transfer level: Needs assistance Equipment used: Rolling walker (2 wheeled) Transfers: Sit to/from Stand Sit to Stand: Min assist         General transfer comment: Pt requires bil UE to push up from surface to static stand with RW    Balance Overall balance assessment: Needs assistance Sitting-balance support: Bilateral upper extremity supported;Feet supported Sitting balance-Leahy Scale: Fair     Standing balance support: During functional  activity;Single extremity supported Standing balance-Leahy Scale: Fair                             ADL either performed or assessed with clinical judgement   ADL Overall ADL's : Needs assistance/impaired   Eating/Feeding Details (indicate cue type and reason): setup to attempt tea Grooming: Min guard;Standing Grooming Details (indicate cue type and reason): UE support to complete oral care but sustain static standing for task. pt requires rest break after hand hygiene and oral care             Lower Body Dressing: Modified independent;Bed level Lower Body Dressing Details (indicate cue type and reason): able to figure 4 cross in bed to don socks Toilet Transfer: Minimal assistance;RW;BSC Toilet Transfer Details (indicate cue type and reason): requires UE support Toileting- Clothing Manipulation and Hygiene: Supervision/safety;Sitting/lateral lean       Functional mobility during ADLs: Minimal assistance;Rolling walker General ADL Comments: Pt reluctant to get oOb but once pt initiated completed all task. pt dislikes sitting in recliner so HOB elevated for upright posture Pt was motivate to walk further to avoid sitting in the chair follow because seh states "i am not getting stuck in that chair again"     Vision       Perception     Praxis      Cognition Arousal/Alertness: Awake/alert Behavior During Therapy: Anxious Overall Cognitive Status: Within Functional Limits for tasks assessed  Exercises     Shoulder Instructions       General Comments 4L o2 tank required and chair follow. pt requires several rest breaks but progressing toward goals    Pertinent Vitals/ Pain       Pain Assessment: Faces Faces Pain Scale: Hurts little more Pain Location: abdomen/ tired Pain Descriptors / Indicators: Discomfort;Operative site guarding Pain Intervention(s): Monitored during session;Premedicated before  session;Repositioned  Home Living                                          Prior Functioning/Environment              Frequency  Min 2X/week        Progress Toward Goals  OT Goals(current goals can now be found in the care plan section)  Progress towards OT goals: Progressing toward goals  Acute Rehab OT Goals Patient Stated Goal: go home OT Goal Formulation: With patient Time For Goal Achievement: 10/15/19 Potential to Achieve Goals: Good ADL Goals Pt Will Perform Grooming: with supervision;standing Pt Will Perform Upper Body Dressing: sitting;with supervision Pt Will Perform Lower Body Dressing: with min guard assist;sit to/from stand Pt Will Transfer to Toilet: with min guard assist;ambulating;bedside commode Pt Will Perform Toileting - Clothing Manipulation and hygiene: with min guard assist;sit to/from stand Pt Will Perform Tub/Shower Transfer: Tub transfer;with min guard assist;ambulating;3 in 1;rolling walker Additional ADL Goal #1: Pt will complete one ADL in standing with less than 10% cues for pt safety.  Plan Discharge plan remains appropriate    Co-evaluation                 AM-PAC OT "6 Clicks" Daily Activity     Outcome Measure   Help from another person eating meals?: None Help from another person taking care of personal grooming?: A Little Help from another person toileting, which includes using toliet, bedpan, or urinal?: A Little Help from another person bathing (including washing, rinsing, drying)?: A Little Help from another person to put on and taking off regular upper body clothing?: A Little Help from another person to put on and taking off regular lower body clothing?: A Little 6 Click Score: 19    End of Session Equipment Utilized During Treatment: Rolling walker;Oxygen  OT Visit Diagnosis: Unsteadiness on feet (R26.81);Muscle weakness (generalized) (M62.81);Pain   Activity Tolerance Patient tolerated treatment  well   Patient Left in bed;with call bell/phone within reach;with bed alarm set;with SCD's reapplied   Nurse Communication Mobility status;Precautions        Time: [id number] OT Time Calculation (min): 40 min  Charges: OT General Charges $OT Visit: 1 Visit OT Treatments $Self Care/Home Management : 8-22 mins   Julie Rollins, OTR/L  Acute Rehabilitation Services Pager: [phone] Office: [phone] .    Jeri Modena 10/04/2019, 12:32 PM

## 2019-10-05 ENCOUNTER — Inpatient Hospital Stay (HOSPITAL_COMMUNITY): Payer: Medicare HMO

## 2019-10-05 DIAGNOSIS — Y95 Nosocomial condition: Secondary | ICD-10-CM | POA: Diagnosis not present

## 2019-10-05 DIAGNOSIS — J9601 Acute respiratory failure with hypoxia: Secondary | ICD-10-CM | POA: Diagnosis not present

## 2019-10-05 DIAGNOSIS — J189 Pneumonia, unspecified organism: Secondary | ICD-10-CM | POA: Diagnosis not present

## 2019-10-05 LAB — AMYLASE, PLEURAL OR PERITONEAL FLUID: Amylase, Fluid: <5 U/L

## 2019-10-05 LAB — CBC
HCT: 35.1 % — ABNORMAL LOW (ref 36.0–46.0)
Hemoglobin: 11.5 g/dL — ABNORMAL LOW (ref 12.0–15.0)
MCH: 31.7 pg (ref 26.0–34.0)
MCHC: 32.8 g/dL (ref 30.0–36.0)
MCV: 96.7 fL (ref 80.0–100.0)
Platelets: 192 10*3/uL (ref 150–400)
RBC: 3.63 MIL/uL — ABNORMAL LOW (ref 3.87–5.11)
RDW: 15.9 % — ABNORMAL HIGH (ref 11.5–15.5)
WBC: 10.5 10*3/uL (ref 4.0–10.5)
nRBC: 0 % (ref 0.0–0.2)

## 2019-10-05 LAB — GLUCOSE, CAPILLARY
Glucose-Capillary: 118 mg/dL — ABNORMAL HIGH (ref 70–99)
Glucose-Capillary: 128 mg/dL — ABNORMAL HIGH (ref 70–99)
Glucose-Capillary: 145 mg/dL — ABNORMAL HIGH (ref 70–99)
Glucose-Capillary: 146 mg/dL — ABNORMAL HIGH (ref 70–99)
Glucose-Capillary: 155 mg/dL — ABNORMAL HIGH (ref 70–99)
Glucose-Capillary: 166 mg/dL — ABNORMAL HIGH (ref 70–99)

## 2019-10-05 LAB — PHOSPHORUS: Phosphorus: 2.8 mg/dL (ref 2.5–4.6)

## 2019-10-05 LAB — BASIC METABOLIC PANEL
Anion gap: 10 (ref 5–15)
BUN: 7 mg/dL — ABNORMAL LOW (ref 8–23)
CO2: 26 mmol/L (ref 22–32)
Calcium: 8.6 mg/dL — ABNORMAL LOW (ref 8.9–10.3)
Chloride: 103 mmol/L (ref 98–111)
Creatinine, Ser: 0.39 mg/dL — ABNORMAL LOW (ref 0.44–1.00)
GFR calc Af Amer: >60 mL/min (ref >60)
GFR calc non Af Amer: >60 mL/min (ref >60)
Glucose, Bld: 168 mg/dL — ABNORMAL HIGH (ref 70–99)
Potassium: 3.3 mmol/L — ABNORMAL LOW (ref 3.5–5.1)
Sodium: 139 mmol/L (ref 135–145)

## 2019-10-05 LAB — MAGNESIUM: Magnesium: 2.1 mg/dL (ref 1.7–2.4)

## 2019-10-05 MED ORDER — OSMOLITE 1.5 CAL PO LIQD
1000.0000 mL | ORAL | Status: DC
  Administered 2019-10-05 – 2019-10-15 (×6): 1000 mL
  Filled 2019-10-05 (×11): qty 1000

## 2019-10-05 MED ORDER — BOOST / RESOURCE BREEZE PO LIQD CUSTOM
1.0000 | Freq: Two times a day (BID) | ORAL | Status: DC
  Administered 2019-10-05 – 2019-10-21 (×5): 1 via ORAL

## 2019-10-05 MED ORDER — PRO-STAT SUGAR FREE PO LIQD
30.0000 mL | Freq: Two times a day (BID) | ORAL | Status: DC
  Administered 2019-10-06 – 2019-10-21 (×25): 30 mL
  Filled 2019-10-05 (×28): qty 30

## 2019-10-05 NOTE — Plan of Care (Signed)
  Problem: Education: Goal: Knowledge of General Education information will improve Description: Including pain rating scale, medication(s)/side effects and non-pharmacologic comfort measures Outcome: Progressing   Problem: Health Behavior/Discharge Planning: Goal: Ability to manage health-related needs will improve Outcome: Progressing   Problem: Clinical Measurements: Goal: Ability to maintain clinical measurements within normal limits will improve Outcome: Progressing Goal: Will remain free from infection Outcome: Progressing Goal: Diagnostic test results will improve Outcome: Progressing Goal: Respiratory complications will improve Outcome: Progressing Goal: Cardiovascular complication will be avoided Outcome: Progressing   Problem: Activity: Goal: Risk for activity intolerance will decrease Outcome: Progressing   Problem: Nutrition: Goal: Adequate nutrition will be maintained Outcome: Progressing   Problem: Coping: Goal: Level of anxiety will decrease Outcome: Progressing   Problem: Elimination: Goal: Will not experience complications related to bowel motility Outcome: Progressing Goal: Will not experience complications related to urinary retention Outcome: Progressing   Problem: Pain Managment: Goal: General experience of comfort will improve Outcome: Progressing   Problem: Safety: Goal: Ability to remain free from injury will improve Outcome: Progressing   Problem: Skin Integrity: Goal: Risk for impaired skin integrity will decrease Outcome: Progressing   Problem: Education: Goal: Will demonstrate proper wound care and an understanding of methods to prevent future damage Outcome: Progressing   Problem: Bowel/Gastric: Goal: Gastrointestinal status for postoperative course will improve Outcome: Progressing   Problem: Nutritional: Goal: Will attain and maintain optimal nutritional status will improve Outcome: Progressing   Problem: Clinical  Measurements: Goal: Postoperative complications will be avoided or minimized Outcome: Progressing   Problem: Respiratory: Goal: Will regain and/or maintain adequate ventilation Outcome: Progressing   Problem: Skin Integrity: Goal: Wound healing without signs and symptoms of infection will improve Outcome: Progressing Goal: Risk for impaired skin integrity will decrease Outcome: Progressing   Problem: Tissue Perfusion: Goal: Risk of venous thrombosis will decrease Outcome: Progressing   Problem: Urinary Elimination: Goal: Ability to achieve and maintain adequate urine output will improve Outcome: Progressing   

## 2019-10-05 NOTE — Progress Notes (Signed)
Patient ID: Julie Rollins, female   DOB: 18-May-1946, 73 y.o.   MRN: [id number] 8 Days Post-Op   Subjective: Added reglan yesterday and restarted tube feeds.  Still having nausea.  Denies any significant pain.  Transferred to stepdown.  Had BM.     Objective: Vital signs in last 24 hours: Temp:  [97.7 F (36.5 C)-98.4 F (36.9 C)] 97.9 F (36.6 C) (11/11 1128) Pulse Rate:  [96-108] 104 (11/11 1128) Resp:  [15-19] 18 (11/11 1128) BP: (137-155)/(63-78) 148/73 (11/11 1128) SpO2:  [97 %-100 %] 99 % (11/11 1128) Weight:  [69.1 kg] 69.1 kg (11/11 0600) Last BM Date: 10/04/19  Intake/Output from previous day: 11/10 0701 - 11/11 0700 In: 2471.8 [P.O.:200; I.V.:1082.5; NG/GT:186; IV Piggyback:903.4] Out: 980 [Urine:850; Drains:130] Intake/Output this shift: Total I/O In: 0  Out: 460 [Urine:360; Drains:100]  General appearance: OOB, looks the best she has in a while. Resp: No distress.     Cardio: regular rate and rhythm GI: soft, dry stain on dressing, +BS, drain serosang.     Lab Results: CBC  Recent Labs    10/03/19 0546 10/05/19 0415  WBC 11.3* 10.5  HGB 12.3 11.5*  HCT 36.3 35.1*  PLT 136* 192   BMET Recent Labs    10/04/19 0500 10/05/19 0415  NA 140 139  K 3.5 3.3*  CL 106 103  CO2 25 26  GLUCOSE 74 168*  BUN 10 7*  CREATININE 0.50 0.39*  CALCIUM 8.7* 8.6*   PT/INR No results for input(s): LABPROT, INR in the last 72 hours. ABG No results for input(s): PHART, HCO3 in the last 72 hours.  Invalid input(s): PCO2, PO2  Studies/Results: Dg Chest Port 1 View  Result Date: 10/05/2019 CLINICAL DATA:  Hypoxia.  Post Whipple procedure. EXAM: PORTABLE CHEST 1 VIEW COMPARISON:  10/01/2019 FINDINGS: Right IJ central venous catheter has tip at the cavoatrial junction. Lungs are adequately inflated with significant interval improvement in previously seen bilateral airspace process as there is mild residual hazy airspace density over the perihilar regions most  prominent over the right upper lung. Possible small amount left pleural fluid improved. Cardiomediastinal silhouette and remainder of the exam is unchanged. IMPRESSION: Significant interval improvement in bilateral airspace process with mild residual hazy bilateral perihilar opacification worse over the right upper lung. Possible small amount left pleural fluid improved. Right IJ central venous catheter with tip at the cavoatrial junction. Electronically Signed   By: Marin Olp M.D.   On: 10/05/2019 07:37   Vas [place] Lower Extremity Venous (dvt)  Result Date: 10/04/2019  Lower Venous Study Indications: Swelling.  Risk Factors: Cancer pancreatic. Comparison Study: 04/15/2019 negative bilaerally. Performing Technologist: Antonieta Pert RDMS, RVT  Examination Guidelines: A complete evaluation includes B-mode imaging, spectral Doppler, color Doppler, and power Doppler as needed of all accessible portions of each vessel. Bilateral testing is considered an integral part of a complete examination. Limited examinations for reoccurring indications may be performed as noted.  +---------+---------------+---------+-----------+----------+------------------+  RIGHT     Compressibility Phasicity Spontaneity Properties Thrombus Aging      +---------+---------------+---------+-----------+----------+------------------+  CFV       Full            Yes       Yes                                        +---------+---------------+---------+-----------+----------+------------------+  SFJ  Full                                                                 +---------+---------------+---------+-----------+----------+------------------+  FV Prox   Full                                                                 +---------+---------------+---------+-----------+----------+------------------+  FV Mid    Full                                                                  +---------+---------------+---------+-----------+----------+------------------+  FV Distal Full                                                                 +---------+---------------+---------+-----------+----------+------------------+  PFV       Full                                                                 +---------+---------------+---------+-----------+----------+------------------+  POP       Full            Yes       Yes                                        +---------+---------------+---------+-----------+----------+------------------+  PTV       Full                                             limited                                                                         visualization       +---------+---------------+---------+-----------+----------+------------------+  PERO      Full  limited                                                                         visualization       +---------+---------------+---------+-----------+----------+------------------+  GSV       Full                                                                 +---------+---------------+---------+-----------+----------+------------------+   +---------+---------------+---------+-----------+----------+------------------+  LEFT      Compressibility Phasicity Spontaneity Properties Thrombus Aging      +---------+---------------+---------+-----------+----------+------------------+  CFV       Full            Yes       Yes                                        +---------+---------------+---------+-----------+----------+------------------+  SFJ       Full                                                                 +---------+---------------+---------+-----------+----------+------------------+  FV Prox   Full                                                                 +---------+---------------+---------+-----------+----------+------------------+  FV Mid    Full                                                                  +---------+---------------+---------+-----------+----------+------------------+  FV Distal Full                                                                 +---------+---------------+---------+-----------+----------+------------------+  PFV       Full                                                                 +---------+---------------+---------+-----------+----------+------------------+  POP       Full            Yes       Yes                                        +---------+---------------+---------+-----------+----------+------------------+  PTV       Full                                             limited                                                                         visualization       +---------+---------------+---------+-----------+----------+------------------+  PERO      Full                                             limited                                                                         visualization       +---------+---------------+---------+-----------+----------+------------------+  GSV       Full                                                                 +---------+---------------+---------+-----------+----------+------------------+     Summary: Right: There is no evidence of deep vein thrombosis in the lower extremity. However, portions of this examination were limited- see technologist comments above. No cystic structure found in the popliteal fossa. Left: There is no evidence of deep vein thrombosis in the lower extremity. However, portions of this examination were limited- see technologist comments above. No cystic structure found in the popliteal fossa.  *See table(s) above for measurements and observations. Electronically signed by Harold Barban MD on 10/04/2019 at 5:50:42 PM.    Final     Anti-infectives: Anti-infectives (From admission, onward)   Start     Dose/Rate Route Frequency Ordered Stop    10/02/19 1858  vancomycin (VANCOCIN) IVPB 1000 mg/200 mL premix     1,000 mg 200 mL/hr over 60 Minutes Intravenous Every 12 hours 10/02/19 1533 10/05/19 2359   09/29/19 0630  vancomycin (VANCOCIN) IVPB 1000 mg/200 mL premix  Status:  Discontinued     1,000 mg 200 mL/hr over 60 Minutes Intravenous Every 24 hours 09/29/19 0625 10/02/19 1533   09/29/19 0615  ceFEPIme (MAXIPIME) 2 g in sodium chloride  0.9 % 100 mL IVPB     2 g 200 mL/hr over 30 Minutes Intravenous Every 12 hours 09/29/19 0605 10/05/19 2359   10/02/2019 2200  ceFAZolin (ANCEF) IVPB 2g/100 mL premix     2 g 200 mL/hr over 30 Minutes Intravenous Every 8 hours 10/22/2019 1523 09/28/19 1900   10/05/2019 0600  ceFAZolin (ANCEF) IVPB 2g/100 mL premix     2 g 200 mL/hr over 30 Minutes Intravenous On call to O.R. 10/22/2019 0550 10/09/2019 1338   10/13/2019 0558  ceFAZolin (ANCEF) 2-4 GM/100ML-% IVPB    Note to Pharmacy: Marga Melnick   : cabinet override      09/28/2019 0558 10/03/2019 1814      Assessment/Plan: s/p Procedure(s): WHIPPLE PROCEDURE GASTROJEJUNOSTOMY, J TUBE 11/3 for adenocarcinoma of the pancreas, s/p neoadjuvant chemo and neoadjuvant SBRT.    Pathology ypT2N1 ABL anemia stable Lovenox for VTE ppx. Increase tube feeds to goal.   Standing reglan for nausea. Pain control adequate Pneumonia- antibiotics per PCCM.  Decreased oxygen requirements.  On broad spectrum antibiotics.    Work toward SNF and getting full nutrition.     LOS: 8 days    Milus Height, MD FACS Surgical Oncology, General Surgery, Trauma and [facility], [place] [phone] Check [email or web address] for coverage night/weekend   10/05/2019

## 2019-10-05 NOTE — Progress Notes (Signed)
Physical Therapy Treatment Patient Details Name: Julie Rollins MRN: [id number] DOB: 1946-11-06 Today's Date: 10/05/2019    History of Present Illness 73 y.o. female with medical history significant of pancreatic cancer, breast cancer status post lumpectomy and radiation, hypertension, hypothyroidism admitted s/p Whipple procedure.    PT Comments    Continuing work on functional mobility and activity tolerance;  Today's session somewhat limited by pain -- she did receive pain meds just prior to session, and after using the [facility], she stated she must sit down, unable to walk; We did convince her to walk in the room around the bed to sit in the recliner; Walked on [employer], and O2 sats decr to 86% observed lowest, restarted supplemental O2; Dr. Carlis Abbott aware  Follow Up Recommendations  SNF     Equipment Recommendations  None recommended by PT    Recommendations for Other Services       Precautions / Restrictions Precautions Precautions: Fall;Other (comment) Precaution Comments: watch O2 Restrictions Weight Bearing Restrictions: No    Mobility  Bed Mobility Overal bed mobility: Needs Assistance Bed Mobility: Supine to Sit     Supine to sit: Min assist;HOB elevated     General bed mobility comments: minA for scooting to EOB with HOB elevated and management of lines  Transfers Overall transfer level: Needs assistance Equipment used: Rolling walker (2 wheeled) Transfers: Sit to/from [facility] Sit to Stand: Min assist Stand pivot transfers: Mod assist       General transfer comment: Pt requires bil UE to push up from surface to static stand with RW, v/c's for safe hand placement; initially standing impulsively to get to [facility] quickly to move bowels; Light mod assist to power up from bed that initial time  Ambulation/Gait Ambulation/Gait assistance: +2 safety/equipment;Min assist Gait Distance (Feet): 20 Feet Assistive device: Rolling walker (2  wheeled) Gait Pattern/deviations: Step-through pattern;Decreased stride length;Wide base of support Gait velocity: slow   General Gait Details: Cues to self-monitor for activity tolerance; She delcined to walk much further, despite encouragement; trunk flexed   Stairs             Wheelchair Mobility    Modified Rankin (Stroke Patients Only)       Balance Overall balance assessment: Needs assistance Sitting-balance support: Bilateral upper extremity supported;Feet supported Sitting balance-Leahy Scale: Fair Sitting balance - Comments: able to maintain upright sitting balance for short period of time   Standing balance support: During functional activity;Single extremity supported Standing balance-Leahy Scale: Fair Standing balance comment: Pt too fatigued to perform tasks in standing today,                            Cognition Arousal/Alertness: Awake/alert Behavior During Therapy: Anxious Overall Cognitive Status: Within Functional Limits for tasks assessed                                 General Comments: Pt very particular about placement of each item and requires motivational cues to ambulate       Exercises      General Comments General comments (skin integrity, edema, etc.): Pt desats to 86% O2 on RA; quickly recovering after 2L O2.      Pertinent Vitals/Pain Pain Assessment: Faces Faces Pain Scale: Hurts little more Pain Location: abdomen/ tired Pain Descriptors / Indicators: Discomfort;Operative site guarding Pain Intervention(s): Monitored during session    Home  Living                      Prior Function            PT Goals (current goals can now be found in the care plan section) Acute Rehab PT Goals Patient Stated Goal: go home PT Goal Formulation: With patient Time For Goal Achievement: 10/15/19 Potential to Achieve Goals: Good Progress towards PT goals: Progressing toward goals(slowly)     Frequency    Min 3X/week      PT Plan Current plan remains appropriate    Co-evaluation PT/OT/SLP Co-Evaluation/Treatment: Yes Reason for Co-Treatment: Complexity of the patient's impairments (multi-system involvement);To address functional/ADL transfers PT goals addressed during session: Mobility/safety with mobility OT goals addressed during session: ADL's and self-care      AM-PAC PT "6 Clicks" Mobility   Outcome Measure  Help needed turning from your back to your side while in a flat bed without using bedrails?: A Little Help needed moving from lying on your back to sitting on the side of a flat bed without using bedrails?: A Little Help needed moving to and from a bed to a chair (including a wheelchair)?: A Little Help needed standing up from a chair using your arms (e.g., wheelchair or bedside chair)?: A Little Help needed to walk in hospital room?: A Little Help needed climbing 3-5 steps with a railing? : A Lot 6 Click Score: 17    End of Session   Activity Tolerance: Patient tolerated treatment well Patient left: with call bell/phone within reach;in bed Nurse Communication: Mobility status PT Visit Diagnosis: Difficulty in walking, not elsewhere classified (R26.2);Muscle weakness (generalized) (M62.81)     Time: [id number] PT Time Calculation (min) (ACUTE ONLY): 26 min  Charges:  $Gait Training: 8-22 mins                     Roney Marion, [place] Pager [phone] Office [phone]    Colletta [place] 10/05/2019, 1:33 PM

## 2019-10-05 NOTE — Progress Notes (Signed)
Nutrition Follow-up  DOCUMENTATION CODES:   Not applicable  INTERVENTION:  Provide Boost Breeze po BID, each supplement provides 250 kcal and 9 grams of protein.  Continue Osmolite 1.5 formula via J-tube and increase by 10 ml every 8 hours (per MD orders) to goal rate of 50 ml/hr.  Provide 30 ml Prostat BID via J-tube.   Tube feeding to provide 2000 kcal (100% of needs), 105 grams of protein, and 912 ml water.   NUTRITION DIAGNOSIS:   Increased nutrient needs related to cancer and cancer related treatments as evidenced by estimated needs; ongoing  GOAL:   Patient will meet greater than or equal to 90% of their needs; progressing  MONITOR:   PO intake, Supplement acceptance, Diet advancement, Skin, TF tolerance, Weight trends, I & O's, Labs  REASON FOR ASSESSMENT:   Consult Enteral/tube feeding initiation and management  ASSESSMENT:   Pt with PMH of breast cancer s/p lumpectomy and XRT, hypothyroidism, HTN, dx with pancreatic cancer May 2020 s/p ERCP with stent, chemo (complicated by nausea, weight loss, duodenal obstruction requiring TPN) now admitted for pancreaticoduodenectomy with cholecystectomy with removal of 4 cm from pancreatic head. Pt developed acute hypoxic respiratory failure from HCAP and anemia after surgery.   Procedure(11/3):  WHIPPLE PROCEDURE (Abdomen) GASTROJEJUNOSTOMY, J TUBE (Abdomen) for adenocarcinoma of the pancreas, s/p neoadjuvant chemo and neoadjuvant SBRT  Pt is currently on a full liquid diet, however has been refusing po due to nausea. Plans to increase tube feeds to goal rate to provide full nutrition. RD to order Boost Breeze to aid in po intake and Prostat via tube to aid in adequate protein needs. RD to continue to monitor for tolerance.   Labs and medications reviewed.   Diet Order:   Diet Order            Diet full liquid Room service appropriate? Yes; Fluid consistency: Thin  Diet effective now              EDUCATION NEEDS:    No education needs have been identified at this time  Skin:  Skin Assessment: Skin Integrity Issues: Skin Integrity Issues:: Incisions Incisions: abdomen  Last BM:  11/10  Height:   Ht Readings from Last 1 Encounters:  10/13/2019 5\' 4"  (1.626 m)    Weight:   Wt Readings from Last 1 Encounters:  10/05/19 69.1 kg    Ideal Body Weight:  54.5 kg  BMI:  Body mass index is 26.15 kg/m.  Estimated Nutritional Needs:   Kcal:  2000-2200  Protein:  100-115 grams  Fluid:  2L/day    Corrin Parker, MS, RD, LDN Pager # [phone] After hours/ weekend pager # [phone]

## 2019-10-05 NOTE — Progress Notes (Signed)
Occupational Therapy Treatment Patient Details Name: Julie Rollins MRN: [id number] DOB: 11/17/1946 Today's Date: 10/05/2019    History of present illness 73 y.o. female with medical history significant of pancreatic cancer, breast cancer status post lumpectomy and radiation, hypertension, hypothyroidism admitted s/p Whipple procedure.   OT comments  Pt continues to be anxious throughout our session. Pt minA for bed mobility and modA for transfer to [facility] for emergent needs. Pt maxA for toileting and pericare as pt unable to assist. Pt set-upA for grooming in chair. Pt ambulating in room with RW and minA +2 for safety and lines management. Pt reports feeling nauseous and RN aware. Pt desats to 86% O2 on RA; quickly recovering after 2L O2. Pt would greatly benefit from continued OT for BUE strength, ADL and increasing mobility. OT following.    Follow Up Recommendations  CIR;SNF    Equipment Recommendations  3 in 1 bedside commode;Hospital bed    Recommendations for Other Services      Precautions / Restrictions Precautions Precautions: Fall;Other (comment) Precaution Comments: watch O2 Restrictions Weight Bearing Restrictions: No       Mobility Bed Mobility Overal bed mobility: Needs Assistance Bed Mobility: Supine to Sit     Supine to sit: Min assist;HOB elevated     General bed mobility comments: minA for scooting to EOB with HOB elevated and management of lines  Transfers Overall transfer level: Needs assistance Equipment used: Rolling walker (2 wheeled) Transfers: Sit to/from Stand Sit to Stand: Min assist Stand pivot transfers: Min assist;Mod assist;+2 physical assistance;+2 safety/equipment       General transfer comment: Pt requires bil UE to push up from surface to static stand with RW, v/c's for safe hand placement    Balance Overall balance assessment: Needs assistance Sitting-balance support: Bilateral upper extremity supported;Feet supported Sitting  balance-Leahy Scale: Fair Sitting balance - Comments: able to maintain upright sitting balance for short period of time   Standing balance support: During functional activity;Single extremity supported Standing balance-Leahy Scale: Fair Standing balance comment: Pt too fatigued to perform tasks in standing today,                           ADL either performed or assessed with clinical judgement   ADL Overall ADL's : Needs assistance/impaired Eating/Feeding: Sitting;Modified independent   Grooming: Set up;Sitting                   Toilet Transfer: Minimal assistance;+2 for physical assistance;+2 for safety/equipment;Cueing for safety;Stand-pivot Toilet Transfer Details (indicate cue type and reason): emergent need to transfer to [facility] so pt did not ambulate to [facility] Toileting- Clothing Manipulation and Hygiene: Maximal assistance Toileting - Clothing Manipulation Details (indicate cue type and reason): Pt too anxious to perform task and requiring BUEs to stabilize self when standing upright/a little bent over     Functional mobility during ADLs: Minimal assistance;Rolling walker General ADL Comments: Pt motivated to get OOB to recliner today     Vision       Perception     Praxis      Cognition Arousal/Alertness: Awake/alert Behavior During Therapy: Anxious Overall Cognitive Status: Within Functional Limits for tasks assessed                                 General Comments: Pt very particular about placement of each item and requires motivational cues to ambulate  Exercises     Shoulder Instructions       General Comments Pt desats to 86% O2 on RA; quickly recovering after 2L O2.    Pertinent Vitals/ Pain       Pain Assessment: Faces Faces Pain Scale: Hurts little more Pain Location: abdomen/ tired Pain Descriptors / Indicators: Discomfort;Operative site guarding Pain Intervention(s): Monitored during session  Home Living                                           Prior Functioning/Environment              Frequency  Min 2X/week        Progress Toward Goals  OT Goals(current goals can now be found in the care plan section)  Progress towards OT goals: Progressing toward goals  Acute Rehab OT Goals Patient Stated Goal: go home OT Goal Formulation: With patient Time For Goal Achievement: 10/15/19 Potential to Achieve Goals: Good ADL Goals Pt Will Perform Grooming: with supervision;standing Pt Will Perform Upper Body Dressing: sitting;with supervision Pt Will Perform Lower Body Dressing: with min guard assist;sit to/from stand Pt Will Transfer to Toilet: with min guard assist;ambulating;bedside commode Pt Will Perform Toileting - Clothing Manipulation and hygiene: with min guard assist;sit to/from stand Pt Will Perform Tub/Shower Transfer: Tub transfer;with min guard assist;ambulating;3 in 1;rolling walker Additional ADL Goal #1: Pt will complete one ADL in standing with less than 10% cues for pt safety.  Plan Discharge plan remains appropriate    Co-evaluation    PT/OT/SLP Co-Evaluation/Treatment: Yes Reason for Co-Treatment: Complexity of the patient's impairments (multi-system involvement)   OT goals addressed during session: ADL's and self-care      AM-PAC OT "6 Clicks" Daily Activity     Outcome Measure   Help from another person eating meals?: None Help from another person taking care of personal grooming?: A Little Help from another person toileting, which includes using toliet, bedpan, or urinal?: A Little Help from another person bathing (including washing, rinsing, drying)?: A Little Help from another person to put on and taking off regular upper body clothing?: A Little Help from another person to put on and taking off regular lower body clothing?: A Little 6 Click Score: 19    End of Session Equipment Utilized During Treatment: Rolling walker  OT Visit  Diagnosis: Unsteadiness on feet (R26.81);Muscle weakness (generalized) (M62.81);Pain   Activity Tolerance Patient tolerated treatment well   Patient Left in chair;with call bell/phone within reach;with chair alarm set   Nurse Communication Mobility status;Precautions        Time: 1000-1032 OT Time Calculation (min): 32 min  Charges: OT General Charges $OT Visit: 1 Visit OT Treatments $Self Care/Home Management : 8-22 mins  Darryl Nestle) Marsa Aris OTR/L Acute Rehabilitation Services Pager: [phone] Office: [phone]    Audie Pinto 10/05/2019, 12:40 PM

## 2019-10-05 NOTE — Progress Notes (Signed)
NAME:  Julie Rollins, MRN:  [id number], DOB:  December 13, 1945, LOS: 6 ADMISSION DATE:  10/15/2019, CONSULTATION DATE:  10/22/2019 REFERRING MD:  Dr. Barry Dienes, CCS, CHIEF COMPLAINT:  Hypotension after Whipple   Brief History   73 yo female [place] developed painless jaundice in May 2020 and found to have pancreatic cancer.  Had ERCP with stent and neoadjuvant chemotherapy complicated by nausea, weight loss, and duodenal obstruction requiring TPN.  Admitted 10/19/2019 for pancreaticoduodenectomy with cholecystectomy with removal of 4 cm from pancreatic head with EBL 650 ml.  Developed hypotension around the time of surgery and PCCM asked to assist with management in ICU.  Past Medical History  Breast cancer s/p lumpectomy and XRT, Hypothyroidism, HTN, MRSE bacteremia October 2020 pancreatic cancer, s/p neoadjuvant chemo  Significant Hospital Events   11/03 pancreaticoduodenectomy with cholecystectomy with removal of 4 cm from pancreatic head with EBL 650 ml 11/05 increased WOB/hypoxia >> add ABx for PNA; Pt agreed to PRBC transfusion 11/10 tolerating 4LNC   Consults:  PCCM  Procedures:  CVL 11/03 >>  Significant Diagnostic Tests:  CXR 11/7-reviewed by myself shows mild improvement in multifocal infiltrate CXR 11/11-personally reviewed, significantly improved bilateral infiltrates  Micro Data:  SARS CoV2 10/30 >> not detected 11/5 expectorated sputum sent   Antimicrobials:  Vancomycin 11/05 >> vanc trough 4, peak 13 on 11/8 Cefepime 11/05 >>   Interim history/subjective:  Complains of ongoing vomiting whenever she eats Has walked with PT, desaturated to 86% when walking on room air.  Quickly recovers once oxygen is replaced Denies shortness of breath  Objective   Blood pressure (!) 148/73, pulse (!) 104, temperature 97.9 F (36.6 C), temperature source Oral, resp. rate 18, height 5\' 4"  (1.626 m), weight 69.1 kg, SpO2 99 %.        Intake/Output Summary (Last 24 hours) at  10/05/2019 1154 Last data filed at 10/05/2019 0830 Gross per 24 hour  Intake 1971.81 ml  Output 790 ml  Net 1181.81 ml   Filed Weights   10/03/19 0500 10/04/19 0500 10/05/19 0600  Weight: 64.5 kg 64.5 kg 69.1 kg    Examination: General -chronically ill-appearing woman sitting up in the bedside chair in no acute distress HEENT-[place]/AT, eyes anicteric Cardiac -regular rate and rhythm, no murmurs Resp-improving rales bilaterally, no accessory muscle use or conversational dyspnea.  Saturating 95% on 2 L nasal cannula. Abdomen -distended, soft, nontender Extremities-improving dependent edema, no cyanosis or clubbing Skin -warm and dry, no rash Neuro-alert, answering questions appropriately, normal speech, moving all extremities spontaneously   Resolved Hospital Problem list   Hypotension after surgery  Assessment & Plan:   Acute hypoxemic respiratory failure - HAP, concern for aspiration -pulmonary edema (remains net positive this admission - possible component of atelectasis  -CXR with persistent ASD P -Continue to titrate down oxygen as able to maintain saturations greater than 88% -Progressive mobility and spending as much time out of bed as possible -Continue incentive spirometer -Aspiration precautions -Likely okay to stop antibiotics after today.  If she develops signs of new or progressive pneumonia, it would be helpful to obtain cultures with restarting antibiotics.   We will sign off.  Please call with questions.   Labs    CMP Latest Ref Rng & Units 10/05/2019 10/04/2019 10/03/2019  Glucose 70 - 99 mg/dL 168(H) 74 129(H)  BUN 8 - 23 mg/dL 7(L) 10 8  Creatinine 0.44 - 1.00 mg/dL 0.39(L) 0.50 0.60  Sodium 135 - 145 mmol/L 139 140 140  Potassium 3.5 -  5.1 mmol/L 3.3(L) 3.5 3.8  Chloride 98 - 111 mmol/L 103 106 105  CO2 22 - 32 mmol/L 26 25 26   Calcium 8.9 - 10.3 mg/dL 8.6(L) 8.7(L) 8.6(L)  Total Protein 6.5 - 8.1 g/dL - - -  Total Bilirubin 0.3 - 1.2 mg/dL - - -   Alkaline Phos 38 - 126 U/L - - -  AST 15 - 41 U/L - - -  ALT 0 - 44 U/L - - -   CBC Latest Ref Rng & Units 10/05/2019 10/03/2019 10/02/2019  WBC 4.0 - 10.5 K/uL 10.5 11.3(H) 9.9  Hemoglobin 12.0 - 15.0 g/dL 11.5(L) 12.3 11.2(L)  Hematocrit 36.0 - 46.0 % 35.1(L) 36.3 33.4(L)  Platelets 150 - 400 K/uL 192 136(L) 99(L)   ABG    Component Value Date/Time   PHART 7.331 (L) 10/12/2019 1202   PCO2ART 40.4 10/16/2019 1202   PO2ART 455.0 (H) 10/01/2019 1202   HCO3 21.4 09/25/2019 1202   TCO2 23 10/08/2019 1202   ACIDBASEDEF 4.0 (H) 10/09/2019 1202   O2SAT 100.0 10/20/2019 [place] Avanell Banwart, DO 10/05/19 11:57 AM [facility]

## 2019-10-06 LAB — BASIC METABOLIC PANEL
Anion gap: 10 (ref 5–15)
BUN: 6 mg/dL — ABNORMAL LOW (ref 8–23)
CO2: 25 mmol/L (ref 22–32)
Calcium: 8.5 mg/dL — ABNORMAL LOW (ref 8.9–10.3)
Chloride: 105 mmol/L (ref 98–111)
Creatinine, Ser: 0.57 mg/dL (ref 0.44–1.00)
GFR calc Af Amer: >60 mL/min (ref >60)
GFR calc non Af Amer: >60 mL/min (ref >60)
Glucose, Bld: 183 mg/dL — ABNORMAL HIGH (ref 70–99)
Potassium: 3.5 mmol/L (ref 3.5–5.1)
Sodium: 140 mmol/L (ref 135–145)

## 2019-10-06 LAB — GLUCOSE, CAPILLARY
Glucose-Capillary: 145 mg/dL — ABNORMAL HIGH (ref 70–99)
Glucose-Capillary: 118 mg/dL — ABNORMAL HIGH (ref 70–99)
Glucose-Capillary: 197 mg/dL — ABNORMAL HIGH (ref 70–99)
Glucose-Capillary: 135 mg/dL — ABNORMAL HIGH (ref 70–99)
Glucose-Capillary: 167 mg/dL — ABNORMAL HIGH (ref 70–99)
Glucose-Capillary: 112 mg/dL — ABNORMAL HIGH (ref 70–99)

## 2019-10-06 LAB — PHOSPHORUS: Phosphorus: 2.4 mg/dL — ABNORMAL LOW (ref 2.5–4.6)

## 2019-10-06 LAB — MAGNESIUM: Magnesium: 2.1 mg/dL (ref 1.7–2.4)

## 2019-10-06 MED ORDER — LIDOCAINE 5 % EX PTCH
1.0000 | MEDICATED_PATCH | CUTANEOUS | Status: DC
  Administered 2019-10-06 – 2019-11-09 (×30): 1 via TRANSDERMAL
  Filled 2019-10-06 (×34): qty 1

## 2019-10-06 MED ORDER — OXYCODONE HCL 5 MG PO TABS
5.0000 mg | ORAL_TABLET | ORAL | Status: DC | PRN
  Administered 2019-10-06 – 2019-10-16 (×26): 10 mg via ORAL
  Administered 2019-10-20: 5 mg via ORAL
  Administered 2019-10-20 – 2019-11-09 (×13): 10 mg via ORAL
  Filled 2019-10-06 (×44): qty 2

## 2019-10-06 NOTE — Progress Notes (Signed)
Physical Therapy Treatment Patient Details Name: Julie Rollins MRN: [id number] DOB: May 26, 1946 Today's Date: 10/06/2019    History of Present Illness 73 y.o. female with medical history significant of pancreatic cancer, breast cancer status post lumpectomy and radiation, hypertension, hypothyroidism admitted s/p Whipple procedure.    PT Comments    Patient remains very anxious and moves impulsively "just trying to get this done!" Responds to short instructions and direct tone to keep her lines intact and her safe. Was able to increase ambulation distance with chair following. Did again require oxygen to keep sats >88% while walking.     Follow Up Recommendations  SNF     Equipment Recommendations  None recommended by PT    Recommendations for Other Services       Precautions / Restrictions Precautions Precautions: Fall;Other (comment) Precaution Comments: watch O2    Mobility  Bed Mobility Overal bed mobility: Needs Assistance Bed Mobility: Supine to Sit     Supine to sit: HOB elevated;Supervision     General bed mobility comments: impulsively moving to sitting and scoot to EOB before lines prepared "hurry up! I need to do something!"  Transfers Overall transfer level: Needs assistance Equipment used: Rolling walker (2 wheeled) Transfers: Sit to/from Stand Sit to Stand: Min guard         General transfer comment: Pt requires bil UE to push up from surface to static stand with RW  Ambulation/Gait Ambulation/Gait assistance: +2 safety/equipment;Min guard Gait Distance (Feet): 45 Feet(seated rest; 40) Assistive device: Rolling walker (2 wheeled) Gait Pattern/deviations: Step-through pattern;Decreased stride length;Wide base of support;Trunk flexed Gait velocity: slow   General Gait Details: chair follow to maximize distance; cues to slow pace and her breathing (becomes anxious with RR up to 34; sats stable on 2L)   Stairs             Wheelchair  Mobility    Modified Rankin (Stroke Patients Only)       Balance Overall balance assessment: Needs assistance Sitting-balance support: Feet supported;No upper extremity supported Sitting balance-Leahy Scale: Fair     Standing balance support: During functional activity;Bilateral upper extremity supported Standing balance-Leahy Scale: Fair Standing balance comment: support via RW                            Cognition Arousal/Alertness: Awake/alert Behavior During Therapy: Anxious;Impulsive Overall Cognitive Status: Within Functional Limits for tasks assessed                                 General Comments: Pt very particular about placement of each item and requires incr time to meet her needs      Exercises      General Comments General comments (skin integrity, edema, etc.): Attempted on room air with desaturation at rest to 89% and 2L resumed prior to walking; sats 94-98% on 2L with gait      Pertinent Vitals/Pain Pain Assessment: Faces Faces Pain Scale: Hurts even more Pain Location: abdomen Pain Descriptors / Indicators: Discomfort;Operative site guarding Pain Intervention(s): Premedicated before session;Limited activity within patient's tolerance;Monitored during session;Repositioned;Relaxation    Home Living                      Prior Function            PT Goals (current goals can now be found in the care plan section)  Acute Rehab PT Goals Patient Stated Goal: go home Time For Goal Achievement: 10/15/19 Potential to Achieve Goals: Good Progress towards PT goals: Progressing toward goals    Frequency    Min 3X/week      PT Plan Current plan remains appropriate    Co-evaluation              AM-PAC PT "6 Clicks" Mobility   Outcome Measure  Help needed turning from your back to your side while in a flat bed without using bedrails?: A Little Help needed moving from lying on your back to sitting on the side  of a flat bed without using bedrails?: A Little Help needed moving to and from a bed to a chair (including a wheelchair)?: A Little Help needed standing up from a chair using your arms (e.g., wheelchair or bedside chair)?: A Little Help needed to walk in hospital room?: A Little Help needed climbing 3-5 steps with a railing? : A Lot 6 Click Score: 17    End of Session Equipment Utilized During Treatment: Oxygen;Gait belt Activity Tolerance: Patient limited by fatigue;Other (comment)(limited by anxiety) Patient left: with call bell/phone within reach;in chair;with chair alarm set Nurse Communication: Mobility status PT Visit Diagnosis: Difficulty in walking, not elsewhere classified (R26.2);Muscle weakness (generalized) (M62.81)     Time: [id number] PT Time Calculation (min) (ACUTE ONLY): 27 min  Charges:  $Gait Training: 23-37 mins                      Barry Brunner, PT Pager [phone]    Rexanne Mano 10/06/2019, 12:11 PM

## 2019-10-06 NOTE — Progress Notes (Signed)
Patient ID: ANIS LEVENSTEIN, female   DOB: 1946-05-30, 73 y.o.   MRN: [id number] 9 Days Post-Op   Subjective: Pt on tube feeds at goal.  Having BMs.  Still nauseated.  Main complaint today is back pain.   Amylase in JP is low.     Objective: Vital signs in last 24 hours: Temp:  [97.5 F (36.4 C)-98.1 F (36.7 C)] 97.6 F (36.4 C) (11/12 0737) Pulse Rate:  [104-117] 109 (11/12 0737) Resp:  [18-27] 21 (11/12 0737) BP: (141-164)/(63-94) 164/75 (11/12 0737) SpO2:  [96 %-99 %] 96 % (11/12 0737) Weight:  [69.2 kg] 69.2 kg (11/12 0434) Last BM Date: 10/06/19  Intake/Output from previous day: 11/11 0701 - 11/12 0700 In: 1544.7 [I.V.:579.7; NG/GT:295; IV Piggyback:550] Out: 985 [Urine:360; Drains:625] Intake/Output this shift: No intake/output data recorded.  General appearance: mild distress.   Resp: breathing comfortably.   Cardio: regular rate and rhythm GI: soft, dry stain on dressing, +BS, drain serosang.     Lab Results: CBC  Recent Labs    10/05/19 0415  WBC 10.5  HGB 11.5*  HCT 35.1*  PLT 192   BMET Recent Labs    10/05/19 0415 10/06/19 0724  NA 139 140  K 3.3* 3.5  CL 103 105  CO2 26 25  GLUCOSE 168* 183*  BUN 7* 6*  CREATININE 0.39* 0.57  CALCIUM 8.6* 8.5*   PT/INR No results for input(s): LABPROT, INR in the last 72 hours. ABG No results for input(s): PHART, HCO3 in the last 72 hours.  Invalid input(s): PCO2, PO2  Studies/Results: Dg Chest Port 1 View  Result Date: 10/05/2019 CLINICAL DATA:  Hypoxia.  Post Whipple procedure. EXAM: PORTABLE CHEST 1 VIEW COMPARISON:  10/01/2019 FINDINGS: Right IJ central venous catheter has tip at the cavoatrial junction. Lungs are adequately inflated with significant interval improvement in previously seen bilateral airspace process as there is mild residual hazy airspace density over the perihilar regions most prominent over the right upper lung. Possible small amount left pleural fluid improved. Cardiomediastinal  silhouette and remainder of the exam is unchanged. IMPRESSION: Significant interval improvement in bilateral airspace process with mild residual hazy bilateral perihilar opacification worse over the right upper lung. Possible small amount left pleural fluid improved. Right IJ central venous catheter with tip at the cavoatrial junction. Electronically Signed   By: Marin Olp M.D.   On: 10/05/2019 07:37   Vas [place] Lower Extremity Venous (dvt)  Result Date: 10/04/2019  Lower Venous Study Indications: Swelling.  Risk Factors: Cancer pancreatic. Comparison Study: 04/15/2019 negative bilaerally. Performing Technologist: Antonieta Pert RDMS, RVT  Examination Guidelines: A complete evaluation includes B-mode imaging, spectral Doppler, color Doppler, and power Doppler as needed of all accessible portions of each vessel. Bilateral testing is considered an integral part of a complete examination. Limited examinations for reoccurring indications may be performed as noted.  +---------+---------------+---------+-----------+----------+------------------+  RIGHT     Compressibility Phasicity Spontaneity Properties Thrombus Aging      +---------+---------------+---------+-----------+----------+------------------+  CFV       Full            Yes       Yes                                        +---------+---------------+---------+-----------+----------+------------------+  SFJ       Full                                                                 +---------+---------------+---------+-----------+----------+------------------+  FV Prox   Full                                                                 +---------+---------------+---------+-----------+----------+------------------+  FV Mid    Full                                                                 +---------+---------------+---------+-----------+----------+------------------+  FV Distal Full                                                                  +---------+---------------+---------+-----------+----------+------------------+  PFV       Full                                                                 +---------+---------------+---------+-----------+----------+------------------+  POP       Full            Yes       Yes                                        +---------+---------------+---------+-----------+----------+------------------+  PTV       Full                                             limited                                                                         visualization       +---------+---------------+---------+-----------+----------+------------------+  PERO      Full                                             limited  visualization       +---------+---------------+---------+-----------+----------+------------------+  GSV       Full                                                                 +---------+---------------+---------+-----------+----------+------------------+   +---------+---------------+---------+-----------+----------+------------------+  LEFT      Compressibility Phasicity Spontaneity Properties Thrombus Aging      +---------+---------------+---------+-----------+----------+------------------+  CFV       Full            Yes       Yes                                        +---------+---------------+---------+-----------+----------+------------------+  SFJ       Full                                                                 +---------+---------------+---------+-----------+----------+------------------+  FV Prox   Full                                                                 +---------+---------------+---------+-----------+----------+------------------+  FV Mid    Full                                                                 +---------+---------------+---------+-----------+----------+------------------+  FV Distal Full                                                                  +---------+---------------+---------+-----------+----------+------------------+  PFV       Full                                                                 +---------+---------------+---------+-----------+----------+------------------+  POP       Full            Yes       Yes                                        +---------+---------------+---------+-----------+----------+------------------+  PTV       Full                                             limited                                                                         visualization       +---------+---------------+---------+-----------+----------+------------------+  PERO      Full                                             limited                                                                         visualization       +---------+---------------+---------+-----------+----------+------------------+  GSV       Full                                                                 +---------+---------------+---------+-----------+----------+------------------+     Summary: Right: There is no evidence of deep vein thrombosis in the lower extremity. However, portions of this examination were limited- see technologist comments above. No cystic structure found in the popliteal fossa. Left: There is no evidence of deep vein thrombosis in the lower extremity. However, portions of this examination were limited- see technologist comments above. No cystic structure found in the popliteal fossa.  *See table(s) above for measurements and observations. Electronically signed by Harold Barban MD on 10/04/2019 at 5:50:42 PM.    Final     Anti-infectives: Anti-infectives (From admission, onward)   Start     Dose/Rate Route Frequency Ordered Stop   10/02/19 1858  vancomycin (VANCOCIN) IVPB 1000 mg/200 mL premix     1,000 mg 200 mL/hr over 60 Minutes Intravenous Every 12 hours 10/02/19 1533 10/05/19 1944    09/29/19 0630  vancomycin (VANCOCIN) IVPB 1000 mg/200 mL premix  Status:  Discontinued     1,000 mg 200 mL/hr over 60 Minutes Intravenous Every 24 hours 09/29/19 0625 10/02/19 1533   09/29/19 0615  ceFEPIme (MAXIPIME) 2 g in sodium chloride 0.9 % 100 mL IVPB     2 g 200 mL/hr over 30 Minutes Intravenous Every 12 hours 09/29/19 0605 10/05/19 2300   10/07/2019 2200  ceFAZolin (ANCEF) IVPB 2g/100 mL premix     2 g 200 mL/hr over 30 Minutes Intravenous Every 8 hours 10/05/2019 1523 09/28/19 1900   09/25/2019 0600  ceFAZolin (ANCEF) IVPB 2g/100 mL premix  2 g 200 mL/hr over 30 Minutes Intravenous On call to O.R. 10/18/2019 0550 09/30/2019 1338   10/03/2019 0558  ceFAZolin (ANCEF) 2-4 GM/100ML-% IVPB    Note to Pharmacy: Marga Melnick   : cabinet override      10/13/2019 0558 09/28/2019 1814      Assessment/Plan: s/p Procedure(s): WHIPPLE PROCEDURE GASTROJEJUNOSTOMY, J TUBE 11/3 for adenocarcinoma of the pancreas, s/p neoadjuvant chemo and neoadjuvant SBRT.    Pathology ypT2N1 ABL anemia stable Lovenox for VTE ppx. Tube feeds at goal.   Standing reglan for nausea. Switch tomorrow to oral reglan, oral robaxin, and oral pain meds if can tolerate.   Pain control adequate Pneumonia- antibiotics off per PCCM.  Decreased oxygen requirements.   Work toward SNF and getting full nutrition.  Check prealbumin in a few days.     LOS: 9 days    Milus Height, MD FACS Surgical Oncology, General Surgery, Trauma and [place] Surgery, [place] [phone] Check [email or web address] for coverage night/weekend

## 2019-10-06 NOTE — NC FL2 (Signed)
[place] MEDICAID [id number] LEVEL OF CARE SCREENING TOOL     IDENTIFICATION  Patient Name: Julie Rollins Birthdate: Feb 12, 1946 Sex: female Admission Date (Current Location): 09/29/2019  [facility] and [place] Number:  Herbalist and Address:  [facility], [place] [address] [place], [place] [address]      Provider Number: [id number]  Attending Physician Name and Address:  Stark Klein, MD  Relative Name and Phone Number:  [name] [phone]    Current Level of Care: Hospital Recommended Level of Care: [place] Prior Approval Number:    Date Approved/Denied:   PASRR Number: [id number]  Discharge Plan: SNF    Current Diagnoses: Patient Active Problem List   Diagnosis Date Noted  . Pancreatic cancer ([place]) 09/30/2019  . Adenocarcinoma of head of pancreas ([place]) 09/30/2019  . Malignant neoplasm of head of pancreas ([place])   . Sepsis ([place]) 09/09/2019  . Moderate protein-calorie malnutrition ([place]) 08/16/2019  . Sepsis due to undetermined organism ([place]) 07/14/2019  . Hyponatremia   . Fever   . Malnutrition of moderate degree 06/22/2019  . Encounter for nasogastric (NG) tube placement   . Partial gastric outlet obstruction   . Intractable nausea and vomiting 06/13/2019  . Genetic testing 06/08/2019  . BRCA2 gene mutation positive in female 06/08/2019  . Family history of bladder cancer   . Family history of stomach cancer   . Family history of throat cancer   . Personal history of breast cancer   . Port-A-Cath in place 04/27/2019  . Carcinoma of head of pancreas ([place]) 04/04/2019  . Pancreatic mass 03/28/2019  . Elevated LFTs 03/28/2019  . Hypothyroidism 03/28/2019  . Diarrhea 03/28/2019  . Generalized weakness   . Jaundice   . Abnormal finding on GI tract imaging   . Elevated alkaline phosphatase level   . Cervical spine fracture ([place]) 07/31/2018    Orientation RESPIRATION BLADDER Height & Weight     Self, Time,  Situation, Place  O2([place] 2L) Continent Weight: 152 lb 8.9 oz (69.2 kg) Height:  '5\' 4"'$  (162.6 cm)  BEHAVIORAL SYMPTOMS/MOOD NEUROLOGICAL BOWEL NUTRITION STATUS      Continent Feeding tube, Diet(Osmolite Continous 1.5 cal, Full liquid)  AMBULATORY STATUS COMMUNICATION OF NEEDS Skin   Total Care Verbally Surgical wounds, Other (Comment)(Abdomen right and left; MASD Labia, perrenium)                       Personal Care Assistance Level of Assistance  Bathing, Dressing Bathing Assistance: Maximum assistance   Dressing Assistance: Maximum assistance     Functional Limitations Info             SPECIAL CARE FACTORS FREQUENCY  PT (By licensed PT), OT (By licensed OT)     PT Frequency: 5x/week OT Frequency: 5x/week            Contractures Contractures Info: Not present    Additional Factors Info  Code Status, Allergies, Insulin Sliding Scale Code Status Info: Full Allergies Info: Codeine   Insulin Sliding Scale Info: Novalog 2-6 units, every 4 hours       Current Medications (10/06/2019):  This is the current hospital active medication list Current Facility-Administered Medications  Medication Dose Route Frequency Provider Last Rate Last Dose  . 0.9 %  sodium chloride infusion   Intravenous PRN Kipp Brood, MD   Stopped at 10/03/19 [phone]  . chlorhexidine (PERIDEX) 0.12 % solution 15 mL  15 mL Mouth Rinse BID  Stark Klein, MD   15 mL at 10/06/19 0931  . Chlorhexidine Gluconate Cloth 2 % PADS 6 each  6 each Topical Daily Stark Klein, MD   6 each at 10/06/19 0941  . Darbepoetin Alfa (ARANESP) injection 25 mcg  25 mcg Subcutaneous Q7 days Kipp Brood, MD   25 mcg at 10/13/2019 2049  . dextrose 5 % and 0.9 % NaCl with KCl 20 mEq/L infusion   Intravenous Continuous Chesley Mires, MD 50 mL/hr at 10/05/19 2337    . diphenhydrAMINE (BENADRYL) 12.5 MG/5ML elixir 12.5 mg  12.5 mg Oral Q6H PRN Stark Klein, MD       Or  . diphenhydrAMINE (BENADRYL) injection 12.5 mg  12.5 mg  Intravenous Q6H PRN Stark Klein, MD   12.5 mg at 10/02/19 1814  . enoxaparin (LOVENOX) injection 40 mg  40 mg Subcutaneous Q24H Stark Klein, MD   40 mg at 10/06/19 0930  . feeding supplement (BOOST / RESOURCE BREEZE) liquid 1 Container  1 Container Oral BID BM Stark Klein, MD   1 Container at 10/06/19 0932  . feeding supplement (OSMOLITE 1.5 CAL) liquid 1,000 mL  1,000 mL Per Tube Continuous Stark Klein, MD 30 mL/hr at 10/05/19 1326 1,000 mL at 10/05/19 1326  . feeding supplement (PRO-STAT SUGAR FREE 64) liquid 30 mL  30 mL Per Tube BID Stark Klein, MD   30 mL at 10/06/19 0931  . fibrin sealant-human (VISTASEAL) kit 4 mL  4 mL Topical Once Stark Klein, MD      . hydrocortisone (ANUSOL-HC) 2.5 % rectal cream 1 application  1 application Rectal Daily PRN Stark Klein, MD      . HYDROmorphone (DILAUDID) injection 0.5-1 mg  0.5-1 mg Intravenous Q4H PRN Stark Klein, MD   1 mg at 10/06/19 1217  . insulin aspart (novoLOG) injection 2-6 Units  2-6 Units Subcutaneous Q4H Stark Klein, MD   2 Units at 10/06/19 1216  . insulin detemir (LEVEMIR) injection 5 Units  5 Units Subcutaneous Q12H Stark Klein, MD   5 Units at 10/06/19 0933  . levothyroxine (SYNTHROID, LEVOTHROID) injection 25 mcg  25 mcg Intravenous Daily Stark Klein, MD   25 mcg at 10/06/19 0932  . lidocaine (LIDODERM) 5 % 1 patch  1 patch Transdermal Q24H Stark Klein, MD   1 patch at 10/06/19 0856  . LORazepam (ATIVAN) injection 1 mg  1 mg Intravenous Q8H PRN Greer Pickerel, MD   1 mg at 09/29/19 2239  . magic mouthwash  10 mL Oral TID PRN Ralene Ok, MD   10 mL at 10/01/19 0957  . MEDLINE mouth rinse  15 mL Mouth Rinse q12n4p Stark Klein, MD   15 mL at 10/05/19 1725  . methocarbamol (ROBAXIN) 500 mg in dextrose 5 % 50 mL IVPB  500 mg Intravenous Q8H PRN Stark Klein, MD 100 mL/hr at 10/05/19 1841 500 mg at 10/05/19 1841  . metoCLOPramide (REGLAN) injection 5 mg  5 mg Intravenous Q8H Stark Klein, MD   5 mg at 10/06/19  0705  . metoprolol tartrate (LOPRESSOR) injection 5 mg  5 mg Intravenous Q6H PRN Stark Klein, MD      . ondansetron (ZOFRAN-ODT) disintegrating tablet 4 mg  4 mg Oral Q6H PRN Stark Klein, MD       Or  . ondansetron (ZOFRAN) injection 4 mg  4 mg Intravenous Q6H PRN Stark Klein, MD   4 mg at 10/05/19 2335  . pantoprazole (PROTONIX) injection 40 mg  40 mg Intravenous QHS Stark Klein,  MD   40 mg at 10/05/19 2217     Discharge Medications: Please see discharge summary for a list of discharge medications.  Relevant Imaging Results:  Relevant Lab Results:   Additional Information [id number]  Kirstie Peri, Student-Social Work

## 2019-10-06 NOTE — TOC Progression Note (Signed)
Transition of Care [facility]) - Progression Note    Patient Details  Name: Julie Rollins MRN: [id number] Date of Birth: September 07, 1946  Transition of Care [facility]) CM/SW [place], [place] Phone Number: 10/06/2019, 2:13 PM  Clinical Narrative:   CSW met with patient to discuss discharge plans and recommendation for SNF. Patient was nauseous and complaining about how she felt awful and didn't think she was ready for rehab, and CSW discussed that the doctor would send her when she was stable; it wasn't going to happen today, but we wanted to start the conversation. CSW discussed CMS list with patient. Patient agreeable to SNF, but doesn't want to go to [facility]. Patient also said she doesn't want to go anywhere that has COVID currently. CSW to fax out referral and will follow up with patient on bed offers.     Expected Discharge Plan: [place] Barriers to Discharge: Continued Medical Work up, Ship broker  Expected Discharge Plan and Services Expected Discharge Plan: [place] In-house Referral: Clinical Social Work Discharge Planning Services: CM Consult Post Acute Care Choice: [place] arrangements for the past 2 months: [place] Determinants of Health (SDOH) Interventions    Readmission Risk Interventions Readmission Risk Prevention Plan 09/30/2019 09/13/2019  Transportation Screening Complete Complete  Medication Review Press photographer) Referral to Pharmacy Referral to Pharmacy  PCP or Specialist appointment within 3-5 days of discharge Complete Not Complete  PCP/Specialist Appt Not Complete comments - pt established at [facility] and with PCP but DC date unknown  [place] or Home Care Consult Complete Complete  SW Recovery Care/Counseling Consult Complete Complete  Palliative Care Screening Not Complete Not Complete  Comments - pt in  procedure did not discuss if any past palliative care involvement, however none in current encounter  [place] Complete Not Applicable  Some recent data might be hidden

## 2019-10-06 NOTE — Progress Notes (Signed)
MEWS/VS Documentation      10/06/2019 0700 10/06/2019 0737 10/06/2019 0827 10/06/2019 1109   MEWS Score:  3  2  2  3    MEWS Score Color:  Yellow  Yellow  Yellow  Yellow   Resp:  -  (!) 21  -  (!) 25   Pulse:  -  (!) 109  -  -   BP:  -  (!) 164/75  -  137/82   Temp:  -  97.6 F (36.4 C)  -  97.9 F (36.6 C)   O2 Device:  -  Room Air  -  Nasal Cannula   Level of Consciousness:  -  -  Alert  -     Patient is baseline tachycardic, slighty tachypneic on O2 4L after CHG bath otherwise stable. Will continue to monitor.

## 2019-10-07 LAB — CBC
HCT: 36.3 % (ref 36.0–46.0)
Hemoglobin: 11.6 g/dL — ABNORMAL LOW (ref 12.0–15.0)
MCH: 31.9 pg (ref 26.0–34.0)
MCHC: 32 g/dL (ref 30.0–36.0)
MCV: 99.7 fL (ref 80.0–100.0)
Platelets: 309 10*3/uL (ref 150–400)
RBC: 3.64 MIL/uL — ABNORMAL LOW (ref 3.87–5.11)
RDW: 16.1 % — ABNORMAL HIGH (ref 11.5–15.5)
WBC: 19.6 10*3/uL — ABNORMAL HIGH (ref 4.0–10.5)
nRBC: 0 % (ref 0.0–0.2)

## 2019-10-07 LAB — GLUCOSE, CAPILLARY
Glucose-Capillary: 185 mg/dL — ABNORMAL HIGH (ref 70–99)
Glucose-Capillary: 126 mg/dL — ABNORMAL HIGH (ref 70–99)
Glucose-Capillary: 65 mg/dL — ABNORMAL LOW (ref 70–99)
Glucose-Capillary: 67 mg/dL — ABNORMAL LOW (ref 70–99)
Glucose-Capillary: 161 mg/dL — ABNORMAL HIGH (ref 70–99)
Glucose-Capillary: 186 mg/dL — ABNORMAL HIGH (ref 70–99)
Glucose-Capillary: 172 mg/dL — ABNORMAL HIGH (ref 70–99)
Glucose-Capillary: 79 mg/dL (ref 70–99)
Glucose-Capillary: 123 mg/dL — ABNORMAL HIGH (ref 70–99)

## 2019-10-07 LAB — COMPREHENSIVE METABOLIC PANEL
ALT: 36 U/L (ref 0–44)
AST: 29 U/L (ref 15–41)
Albumin: 2.5 g/dL — ABNORMAL LOW (ref 3.5–5.0)
Alkaline Phosphatase: 236 U/L — ABNORMAL HIGH (ref 38–126)
Anion gap: 10 (ref 5–15)
BUN: 8 mg/dL (ref 8–23)
CO2: 25 mmol/L (ref 22–32)
Calcium: 8.8 mg/dL — ABNORMAL LOW (ref 8.9–10.3)
Chloride: 109 mmol/L (ref 98–111)
Creatinine, Ser: 0.52 mg/dL (ref 0.44–1.00)
GFR calc Af Amer: >60 mL/min (ref >60)
GFR calc non Af Amer: >60 mL/min (ref >60)
Glucose, Bld: 83 mg/dL (ref 70–99)
Potassium: 4.1 mmol/L (ref 3.5–5.1)
Sodium: 144 mmol/L (ref 135–145)
Total Bilirubin: 0.5 mg/dL (ref 0.3–1.2)
Total Protein: 5.2 g/dL — ABNORMAL LOW (ref 6.5–8.1)

## 2019-10-07 LAB — MAGNESIUM: Magnesium: 2.1 mg/dL (ref 1.7–2.4)

## 2019-10-07 LAB — PREALBUMIN: Prealbumin: 12.5 mg/dL — ABNORMAL LOW (ref 18–38)

## 2019-10-07 LAB — PHOSPHORUS: Phosphorus: 2.1 mg/dL — ABNORMAL LOW (ref 2.5–4.6)

## 2019-10-07 MED ORDER — SODIUM CHLORIDE 0.9 % IV SOLN
INTRAVENOUS | Status: DC
  Administered 2019-10-07 – 2019-10-10 (×4): via INTRAVENOUS

## 2019-10-07 MED ORDER — PANTOPRAZOLE SODIUM 40 MG PO TBEC
40.0000 mg | DELAYED_RELEASE_TABLET | Freq: Every day | ORAL | Status: DC
  Administered 2019-10-07 – 2019-10-19 (×10): 40 mg via ORAL
  Filled 2019-10-07 (×9): qty 1

## 2019-10-07 MED ORDER — METOCLOPRAMIDE HCL 10 MG PO TABS
5.0000 mg | ORAL_TABLET | Freq: Three times a day (TID) | ORAL | Status: DC
  Administered 2019-10-07 – 2019-10-12 (×16): 5 mg via ORAL
  Filled 2019-10-07 (×17): qty 1

## 2019-10-07 MED ORDER — DEXTROSE 50 % IV SOLN
25.0000 mL | Freq: Once | INTRAVENOUS | Status: AC
  Administered 2019-10-07: 25 mL via INTRAVENOUS
  Filled 2019-10-07: qty 50

## 2019-10-07 MED ORDER — SODIUM CHLORIDE 0.9 % IV BOLUS
500.0000 mL | Freq: Once | INTRAVENOUS | Status: AC
  Administered 2019-10-07: 500 mL via INTRAVENOUS

## 2019-10-07 MED ORDER — METHOCARBAMOL 500 MG PO TABS
500.0000 mg | ORAL_TABLET | Freq: Three times a day (TID) | ORAL | Status: DC | PRN
  Administered 2019-10-07 – 2019-10-21 (×17): 500 mg via ORAL
  Filled 2019-10-07 (×18): qty 1

## 2019-10-07 NOTE — Progress Notes (Signed)
Urine output is low and bladder scan showed 139 ml. MD on call notified and 500 ml bolus and IN at 75/hr ordered. Passed on to next shift. Simmie Davies RN

## 2019-10-07 NOTE — Progress Notes (Signed)
Pt not in need of BiPAP at this time. Respiratory status stable at this time. Pt order is PRN. RT will continue to monitor.

## 2019-10-07 NOTE — Progress Notes (Signed)
10 Days Post-Op   Subjective: Continues to have nausea.  More spitting up.  Continues to tolerate tube feeds.  Lidocaine patch helped.    Objective: Vital signs in last 24 hours: Temp:  [97.6 F (36.4 C)-98.1 F (36.7 C)] 97.6 F (36.4 C) (11/12 2304) Pulse Rate:  [108-117] 111 (11/13 0000) Resp:  [17-25] 17 (11/13 0000) BP: (137-164)/(69-84) 140/69 (11/12 2036) SpO2:  [96 %-99 %] 99 % (11/13 0000) Weight:  [69.2 kg] 69.2 kg (11/12 0434) Last BM Date: 10/06/19  Intake/Output from previous day: 11/12 0701 - 11/13 0700 In: 50 [I.V.:50] Out: 180 [Drains:180] Intake/Output this shift: Total I/O In: 50 [I.V.:50] Out: -   On bedside commode now.   General appearance: mild distress.   Resp: breathing comfortably.   Cardio: regular rate and rhythm GI: soft, dry stain on dressing, +BS, drain serosang.     Lab Results: CBC  Recent Labs    10/05/19 0415  WBC 10.5  HGB 11.5*  HCT 35.1*  PLT 192   BMET Recent Labs    10/05/19 0415 10/06/19 0724  NA 139 140  K 3.3* 3.5  CL 103 105  CO2 26 25  GLUCOSE 168* 183*  BUN 7* 6*  CREATININE 0.39* 0.57  CALCIUM 8.6* 8.5*   PT/INR No results for input(s): LABPROT, INR in the last 72 hours. ABG No results for input(s): PHART, HCO3 in the last 72 hours.  Invalid input(s): PCO2, PO2  Studies/Results: Dg Chest Port 1 View  Result Date: 10/05/2019 CLINICAL DATA:  Hypoxia.  Post Whipple procedure. EXAM: PORTABLE CHEST 1 VIEW COMPARISON:  10/01/2019 FINDINGS: Right IJ central venous catheter has tip at the cavoatrial junction. Lungs are adequately inflated with significant interval improvement in previously seen bilateral airspace process as there is mild residual hazy airspace density over the perihilar regions most prominent over the right upper lung. Possible small amount left pleural fluid improved. Cardiomediastinal silhouette and remainder of the exam is unchanged. IMPRESSION: Significant interval improvement in bilateral  airspace process with mild residual hazy bilateral perihilar opacification worse over the right upper lung. Possible small amount left pleural fluid improved. Right IJ central venous catheter with tip at the cavoatrial junction. Electronically Signed   By: Marin Olp M.D.   On: 10/05/2019 07:37    Anti-infectives: Anti-infectives (From admission, onward)   Start     Dose/Rate Route Frequency Ordered Stop   10/02/19 1858  vancomycin (VANCOCIN) IVPB 1000 mg/200 mL premix     1,000 mg 200 mL/hr over 60 Minutes Intravenous Every 12 hours 10/02/19 1533 10/05/19 1944   09/29/19 0630  vancomycin (VANCOCIN) IVPB 1000 mg/200 mL premix  Status:  Discontinued     1,000 mg 200 mL/hr over 60 Minutes Intravenous Every 24 hours 09/29/19 0625 10/02/19 1533   09/29/19 0615  ceFEPIme (MAXIPIME) 2 g in sodium chloride 0.9 % 100 mL IVPB     2 g 200 mL/hr over 30 Minutes Intravenous Every 12 hours 09/29/19 0605 10/05/19 2300   10/21/2019 2200  ceFAZolin (ANCEF) IVPB 2g/100 mL premix     2 g 200 mL/hr over 30 Minutes Intravenous Every 8 hours 10/12/2019 1523 09/28/19 1900   10/17/2019 0600  ceFAZolin (ANCEF) IVPB 2g/100 mL premix     2 g 200 mL/hr over 30 Minutes Intravenous On call to O.R. 09/26/2019 0550 10/23/2019 1338   10/02/2019 0558  ceFAZolin (ANCEF) 2-4 GM/100ML-% IVPB    Note to Pharmacy: Marga Melnick   : cabinet override  10/21/2019 0558 09/29/2019 1814      Assessment/Plan: s/p Procedure(s): WHIPPLE PROCEDURE GASTROJEJUNOSTOMY, J TUBE 11/3 for adenocarcinoma of the pancreas, s/p neoadjuvant chemo and neoadjuvant SBRT.    Pathology ypT2N1 ABL anemia stable after transfusion. Lovenox for VTE ppx. Tube feeds at goal.   Standing reglan for nausea.  Switch tomorrow to oral reglan, oral robaxin, and oral pain meds if can tolerate. Lidocaine patch to back.  Pain control adequate Pneumonia- antibiotics off per PCCM.  Decreased oxygen requirements.   Work toward SNF and getting full nutrition. FL2 has  been sent out.     LOS: 10 days    Milus Height, MD FACS Surgical Oncology, General Surgery, Trauma and [place] Surgery, [place] [phone] Check [email or web address] for coverage night/weekend

## 2019-10-08 LAB — GLUCOSE, CAPILLARY
Glucose-Capillary: 110 mg/dL — ABNORMAL HIGH (ref 70–99)
Glucose-Capillary: 122 mg/dL — ABNORMAL HIGH (ref 70–99)
Glucose-Capillary: 151 mg/dL — ABNORMAL HIGH (ref 70–99)
Glucose-Capillary: 162 mg/dL — ABNORMAL HIGH (ref 70–99)
Glucose-Capillary: 128 mg/dL — ABNORMAL HIGH (ref 70–99)
Glucose-Capillary: 137 mg/dL — ABNORMAL HIGH (ref 70–99)

## 2019-10-08 NOTE — Progress Notes (Signed)
11 Days Post-Op   Subjective/Chief Complaint: Still with dry heaves, nausea uop low over night now picking up   Objective: Vital signs in last 24 hours: Temp:  [97.2 F (36.2 C)-98.2 F (36.8 C)] 97.2 F (36.2 C) (11/14 0810) Pulse Rate:  [111-122] 122 (11/13 2039) Resp:  [20-29] 22 (11/14 0411) BP: (138-149)/(55-75) 138/55 (11/13 2039) SpO2:  [95 %-99 %] 98 % (11/14 0411) Last BM Date: 10/07/19  Intake/Output from previous day: 11/13 0701 - 11/14 0700 In: 2294.9 [NG/GT:1795; IV Piggyback:499.9] Out: 800 [Urine:425; Drains:375] Intake/Output this shift: No intake/output data recorded.  Awake and alert Mildly uncomfortable Lungs clear CV mildly tachy Abdomen soft drain serosang   Lab Results:  Recent Labs    10/07/19 0411  WBC 19.6*  HGB 11.6*  HCT 36.3  PLT 309   BMET Recent Labs    10/06/19 0724 10/07/19 0324  NA 140 144  K 3.5 4.1  CL 105 109  CO2 25 25  GLUCOSE 183* 83  BUN 6* 8  CREATININE 0.57 0.52  CALCIUM 8.5* 8.8*   PT/INR No results for input(s): LABPROT, INR in the last 72 hours. ABG No results for input(s): PHART, HCO3 in the last 72 hours.  Invalid input(s): PCO2, PO2  Studies/Results: No results found.  Anti-infectives: Anti-infectives (From admission, onward)   Start     Dose/Rate Route Frequency Ordered Stop   10/02/19 1858  vancomycin (VANCOCIN) IVPB 1000 mg/200 mL premix     1,000 mg 200 mL/hr over 60 Minutes Intravenous Every 12 hours 10/02/19 1533 10/05/19 1944   09/29/19 0630  vancomycin (VANCOCIN) IVPB 1000 mg/200 mL premix  Status:  Discontinued     1,000 mg 200 mL/hr over 60 Minutes Intravenous Every 24 hours 09/29/19 0625 10/02/19 1533   09/29/19 0615  ceFEPIme (MAXIPIME) 2 g in sodium chloride 0.9 % 100 mL IVPB     2 g 200 mL/hr over 30 Minutes Intravenous Every 12 hours 09/29/19 0605 10/05/19 2300   10/20/2019 2200  ceFAZolin (ANCEF) IVPB 2g/100 mL premix     2 g 200 mL/hr over 30 Minutes Intravenous Every 8 hours  10/02/2019 1523 09/28/19 1900   10/17/2019 0600  ceFAZolin (ANCEF) IVPB 2g/100 mL premix     2 g 200 mL/hr over 30 Minutes Intravenous On call to O.R. 10/23/2019 0550 10/17/2019 1338   09/26/2019 0558  ceFAZolin (ANCEF) 2-4 GM/100ML-% IVPB    Note to Pharmacy: Marga Melnick   : cabinet override      09/29/2019 0558 09/26/2019 1814      Assessment/Plan: s/p Procedure(s) with comments: WHIPPLE PROCEDURE (N/A) - GENERAL AND EPIDURAL GASTROJEJUNOSTOMY, J TUBE (N/A) - GENERAL AND EPIDURAL Laparoscopy Diagnostic (N/A)  Repeat labs in the morning Watch UOP Continue tube feeds   LOS: 11 days    Coralie Keens 10/08/2019

## 2019-10-09 ENCOUNTER — Inpatient Hospital Stay (HOSPITAL_COMMUNITY): Payer: Medicare HMO

## 2019-10-09 LAB — CBC
HCT: 34.2 % — ABNORMAL LOW (ref 36.0–46.0)
Hemoglobin: 10.9 g/dL — ABNORMAL LOW (ref 12.0–15.0)
MCH: 31.9 pg (ref 26.0–34.0)
MCHC: 31.9 g/dL (ref 30.0–36.0)
MCV: 100 fL (ref 80.0–100.0)
Platelets: 383 10*3/uL (ref 150–400)
RBC: 3.42 MIL/uL — ABNORMAL LOW (ref 3.87–5.11)
RDW: 15.9 % — ABNORMAL HIGH (ref 11.5–15.5)
WBC: 17.6 10*3/uL — ABNORMAL HIGH (ref 4.0–10.5)
nRBC: 0 % (ref 0.0–0.2)

## 2019-10-09 LAB — GLUCOSE, CAPILLARY
Glucose-Capillary: 116 mg/dL — ABNORMAL HIGH (ref 70–99)
Glucose-Capillary: 132 mg/dL — ABNORMAL HIGH (ref 70–99)
Glucose-Capillary: 127 mg/dL — ABNORMAL HIGH (ref 70–99)
Glucose-Capillary: 133 mg/dL — ABNORMAL HIGH (ref 70–99)
Glucose-Capillary: 120 mg/dL — ABNORMAL HIGH (ref 70–99)

## 2019-10-09 LAB — BASIC METABOLIC PANEL
Anion gap: 10 (ref 5–15)
BUN: 17 mg/dL (ref 8–23)
CO2: 23 mmol/L (ref 22–32)
Calcium: 8.6 mg/dL — ABNORMAL LOW (ref 8.9–10.3)
Chloride: 106 mmol/L (ref 98–111)
Creatinine, Ser: 0.52 mg/dL (ref 0.44–1.00)
GFR calc Af Amer: >60 mL/min (ref >60)
GFR calc non Af Amer: >60 mL/min (ref >60)
Glucose, Bld: 141 mg/dL — ABNORMAL HIGH (ref 70–99)
Potassium: 4.5 mmol/L (ref 3.5–5.1)
Sodium: 139 mmol/L (ref 135–145)

## 2019-10-09 NOTE — Progress Notes (Signed)
12 Days Post-Op   Subjective/Chief Complaint: Still coughing up stuff No appetite Tolerating tube feeds   Objective: Vital signs in last 24 hours: Temp:  [97.8 F (36.6 C)-98.2 F (36.8 C)] 97.8 F (36.6 C) (11/15 0756) Pulse Rate:  [107-119] 113 (11/15 0756) Resp:  [17-26] 18 (11/15 0756) BP: (123-146)/(59-74) 146/74 (11/15 0756) SpO2:  [94 %-99 %] 95 % (11/15 0756) Last BM Date: 10/08/19  Intake/Output from previous day: 11/14 0701 - 11/15 0700 In: 2225.1 [P.O.:90; I.V.:1500.1; NG/GT:635] Out: 950 [Urine:675; Drains:275] Intake/Output this shift: Total I/O In: 361.2 [I.V.:361.2] Out: -   Exam: Awake and alert in NAD cv tachy Lungs with occ wheeze Abdomen full, drain serosang  Lab Results:  Recent Labs    10/07/19 0411 10/09/19 0522  WBC 19.6* 17.6*  HGB 11.6* 10.9*  HCT 36.3 34.2*  PLT 309 383   BMET Recent Labs    10/07/19 0324 10/09/19 0522  NA 144 139  K 4.1 4.5  CL 109 106  CO2 25 23  GLUCOSE 83 141*  BUN 8 17  CREATININE 0.52 0.52  CALCIUM 8.8* 8.6*   PT/INR No results for input(s): LABPROT, INR in the last 72 hours. ABG No results for input(s): PHART, HCO3 in the last 72 hours.  Invalid input(s): PCO2, PO2  Studies/Results: No results found.  Anti-infectives: Anti-infectives (From admission, onward)   Start     Dose/Rate Route Frequency Ordered Stop   10/02/19 1858  vancomycin (VANCOCIN) IVPB 1000 mg/200 mL premix     1,000 mg 200 mL/hr over 60 Minutes Intravenous Every 12 hours 10/02/19 1533 10/05/19 1944   09/29/19 0630  vancomycin (VANCOCIN) IVPB 1000 mg/200 mL premix  Status:  Discontinued     1,000 mg 200 mL/hr over 60 Minutes Intravenous Every 24 hours 09/29/19 0625 10/02/19 1533   09/29/19 0615  ceFEPIme (MAXIPIME) 2 g in sodium chloride 0.9 % 100 mL IVPB     2 g 200 mL/hr over 30 Minutes Intravenous Every 12 hours 09/29/19 0605 10/05/19 2300   09/30/2019 2200  ceFAZolin (ANCEF) IVPB 2g/100 mL premix     2 g 200 mL/hr  over 30 Minutes Intravenous Every 8 hours 09/26/2019 1523 09/28/19 1900   10/02/2019 0600  ceFAZolin (ANCEF) IVPB 2g/100 mL premix     2 g 200 mL/hr over 30 Minutes Intravenous On call to O.R. 10/14/2019 0550 10/08/2019 1338   10/24/2019 0558  ceFAZolin (ANCEF) 2-4 GM/100ML-% IVPB    Note to Pharmacy: Marga Melnick   : cabinet override      10/03/2019 0558 09/30/2019 1814      Assessment/Plan: s/p Procedure(s) with comments: WHIPPLE PROCEDURE (N/A) - GENERAL AND EPIDURAL GASTROJEJUNOSTOMY, J TUBE (N/A) - GENERAL AND EPIDURAL Laparoscopy Diagnostic (N/A)  Check chest xray WBC down today.  Creatinine normal, UOP coming back up   LOS: 12 days    Coralie Keens 10/09/2019

## 2019-10-09 NOTE — Progress Notes (Signed)
MEWS score has been holding at a 2 during the shift because of heart rate showing at 110s-120s. MD aware from day shift. May be baseline for patient per MD notes. HR increases to high 120s when moving in bed or using bedpan.

## 2019-10-09 NOTE — Plan of Care (Signed)
  Problem: Clinical Measurements: Goal: Respiratory complications will improve Outcome: Progressing   Problem: Nutrition: Goal: Adequate nutrition will be maintained Outcome: Progressing   Problem: Pain Managment: Goal: General experience of comfort will improve Outcome: Progressing   Patient stable, discussed POC with patient, agreeable with plan to TCDB, incentive spirometry q1hr x5 w/max effort 73mL, PRN pain rx, tolerated well, denies question/concerns at this time.

## 2019-10-10 LAB — GLUCOSE, CAPILLARY
Glucose-Capillary: 102 mg/dL — ABNORMAL HIGH (ref 70–99)
Glucose-Capillary: 121 mg/dL — ABNORMAL HIGH (ref 70–99)
Glucose-Capillary: 129 mg/dL — ABNORMAL HIGH (ref 70–99)
Glucose-Capillary: 151 mg/dL — ABNORMAL HIGH (ref 70–99)
Glucose-Capillary: 164 mg/dL — ABNORMAL HIGH (ref 70–99)
Glucose-Capillary: 189 mg/dL — ABNORMAL HIGH (ref 70–99)
Glucose-Capillary: 227 mg/dL — ABNORMAL HIGH (ref 70–99)

## 2019-10-10 MED ORDER — LEVOTHYROXINE SODIUM 50 MCG PO TABS
50.0000 ug | ORAL_TABLET | Freq: Every day | ORAL | Status: DC
  Administered 2019-10-11 – 2019-10-30 (×17): 50 ug via ORAL
  Filled 2019-10-10 (×18): qty 1

## 2019-10-10 MED ORDER — FUROSEMIDE 10 MG/ML IJ SOLN
40.0000 mg | Freq: Once | INTRAMUSCULAR | Status: AC
  Administered 2019-10-10: 40 mg via INTRAVENOUS
  Filled 2019-10-10: qty 4

## 2019-10-10 MED ORDER — DM-GUAIFENESIN ER 30-600 MG PO TB12
1.0000 | ORAL_TABLET | Freq: Two times a day (BID) | ORAL | Status: DC
  Administered 2019-10-10 – 2019-10-20 (×11): 1 via ORAL
  Filled 2019-10-10 (×23): qty 1

## 2019-10-10 NOTE — Progress Notes (Signed)
Physical Therapy Treatment Patient Details Name: Julie Rollins MRN: [id number] DOB: [birth year] Today's Date: 10/10/2019    History of Present Illness 73 y.o. female with medical history significant of pancreatic cancer, breast cancer status post lumpectomy and radiation, hypertension, hypothyroidism admitted s/p Whipple procedure.    PT Comments    Pt with noted significant functional decline. Pt with significant increased abdominal, hip, and bilat LE swelling. Last Tuesday pt was able to don socks in bed and bring self to EOB without assist, pt amb 21' with minA and RW. TODAY, pt reports "I don't feel good at all" Pt unable to bring LEs up to don socks due to limited ROM due to swelling. Pt required modAx2 for bed mobility and std pvt to [facility], pt only able to amb 5' with RW due to level of fatigue. RN made aware and message sent to Dr. Barry Dienes. Acute PT to cont to follow.    Follow Up Recommendations  SNF     Equipment Recommendations  None recommended by PT    Recommendations for Other Services       Precautions / Restrictions Precautions Precautions: Fall;Other (comment) Precaution Comments: significant swelling in hips and abdomen Restrictions Weight Bearing Restrictions: No    Mobility  Bed Mobility Overal bed mobility: Needs Assistance Bed Mobility: Supine to Sit     Supine to sit: Mod assist;+2 for physical assistance     General bed mobility comments: due to swelling pt requiring significantly more assist today than normal  Transfers Overall transfer level: Needs assistance Equipment used: Rolling walker (2 wheeled) Transfers: Sit to/from Stand Sit to Stand: Min assist;+2 physical assistance;+2 safety/equipment Stand pivot transfers: Min assist;+2 physical assistance;+2 safety/equipment       General transfer comment: minA x2 to power up, slow to step, max directional verbal cues  Ambulation/Gait Ambulation/Gait assistance: Min assist;+2  safety/equipment Gait Distance (Feet): 5 Feet Assistive device: Rolling walker (2 wheeled) Gait Pattern/deviations: Step-to pattern;Decreased stride length;Wide base of support Gait velocity: slow Gait velocity interpretation: <1.8 ft/sec, indicate of risk for recurrent falls General Gait Details: decreased step height and length, pt with increased swelling in LEs and fatigued quickly,    Stairs             Wheelchair Mobility    Modified Rankin (Stroke Patients Only)       Balance Overall balance assessment: Needs assistance Sitting-balance support: Feet supported;No upper extremity supported Sitting balance-Leahy Scale: Fair     Standing balance support: During functional activity;Bilateral upper extremity supported Standing balance-Leahy Scale: Poor Standing balance comment: dependent on physical assist or RW                            Cognition Arousal/Alertness: Awake/alert Behavior During Therapy: Anxious Overall Cognitive Status: Within Functional Limits for tasks assessed                                 General Comments: pt con't to be particular but very compliant with PT. Pt however more flat and c/o not feeling well, not HR in 120s at rest and significantly more swollen      Exercises      General Comments General comments (skin integrity, edema, etc.): pt with increased swelling in abdomen and LEs, HR in 120s at rest, into 130s with OOB mobility, assisted pt to commode, pt void alot and had soft stool.  Pt dependent for hygiene      Pertinent Vitals/Pain Pain Assessment: Faces Faces Pain Scale: Hurts even more Pain Location: abdomen Pain Descriptors / Indicators: Discomfort;Operative site guarding Pain Intervention(s): Monitored during session    Home Living                      Prior Function            PT Goals (current goals can now be found in the care plan section) Progress towards PT goals: Not  progressing toward goals - comment(medically declining)    Frequency    Min 3X/week      PT Plan Current plan remains appropriate    Co-evaluation              AM-PAC PT "6 Clicks" Mobility   Outcome Measure  Help needed turning from your back to your side while in a flat bed without using bedrails?: A Lot Help needed moving from lying on your back to sitting on the side of a flat bed without using bedrails?: A Lot Help needed moving to and from a bed to a chair (including a wheelchair)?: A Lot Help needed standing up from a chair using your arms (e.g., wheelchair or bedside chair)?: A Lot Help needed to walk in hospital room?: A Lot Help needed climbing 3-5 steps with a railing? : Total 6 Click Score: 11    End of Session Equipment Utilized During Treatment: Oxygen;Gait belt Activity Tolerance: Patient limited by fatigue;Other (comment) Patient left: with call bell/phone within reach;in chair;with chair alarm set Nurse Communication: Mobility status PT Visit Diagnosis: Difficulty in walking, not elsewhere classified (R26.2);Muscle weakness (generalized) (M62.81)     Time: [id number] PT Time Calculation (min) (ACUTE ONLY): 34 min  Charges:  $Gait Training: 8-22 mins $Therapeutic Activity: 8-22 mins                     Kittie Plater, PT, DPT Acute Rehabilitation Services Pager #: [phone] Office #: [phone]    Berline Lopes 10/10/2019, 12:37 PM

## 2019-10-10 NOTE — Progress Notes (Signed)
13 Days Post-Op   Subjective/Chief Complaint: Still coughing up stuff No appetite Tolerating tube feeds   Objective: Vital signs in last 24 hours: Temp:  [97.7 F (36.5 C)-98.6 F (37 C)] 98.2 F (36.8 C) (11/16 0358) Pulse Rate:  [113-126] 126 (11/16 0024) Resp:  [18-26] 23 (11/16 0358) BP: (142-158)/(70-77) 152/77 (11/15 1914) SpO2:  [94 %-97 %] 94 % (11/16 0358) Weight:  [75 kg] 75 kg (11/16 0500) Last BM Date: 10/09/19  Intake/Output from previous day: 11/15 0701 - 11/16 0700 In: 1727.1 [P.O.:200; I.V.:1407.1; NG/GT:120] Out: 1102 [Urine:1101; Stool:1] Intake/Output this shift: No intake/output data recorded.  Exam: Awake and alert in NAD cv tachy Abdomen soft, minimally distended.  Drain serosang.    Lab Results:  Recent Labs    10/09/19 0522  WBC 17.6*  HGB 10.9*  HCT 34.2*  PLT 383   BMET Recent Labs    10/09/19 0522  NA 139  K 4.5  CL 106  CO2 23  GLUCOSE 141*  BUN 17  CREATININE 0.52  CALCIUM 8.6*   PT/INR No results for input(s): LABPROT, INR in the last 72 hours. ABG No results for input(s): PHART, HCO3 in the last 72 hours.  Invalid input(s): PCO2, PO2  Studies/Results: Dg Chest Port 1 View  Result Date: 10/09/2019 CLINICAL DATA:  Wheezing EXAM: PORTABLE CHEST 1 VIEW COMPARISON:  10/05/2019 FINDINGS: Interval increase in retrocardiac atelectasis or consolidation of the left lung. Otherwise unchanged mild, diffuse interstitial opacity. Mild cardiomegaly. Right neck vascular catheter. IMPRESSION: 1. Interval increase in retrocardiac atelectasis or consolidation of the left lung. 2. Otherwise unchanged mild, diffuse interstitial opacity, consistent with edema or infection. Electronically Signed   By: Eddie Candle M.D.   On: 10/09/2019 17:25    Anti-infectives: Anti-infectives (From admission, onward)   Start     Dose/Rate Route Frequency Ordered Stop   10/02/19 1858  vancomycin (VANCOCIN) IVPB 1000 mg/200 mL premix     1,000 mg 200  mL/hr over 60 Minutes Intravenous Every 12 hours 10/02/19 1533 10/05/19 1944   09/29/19 0630  vancomycin (VANCOCIN) IVPB 1000 mg/200 mL premix  Status:  Discontinued     1,000 mg 200 mL/hr over 60 Minutes Intravenous Every 24 hours 09/29/19 0625 10/02/19 1533   09/29/19 0615  ceFEPIme (MAXIPIME) 2 g in sodium chloride 0.9 % 100 mL IVPB     2 g 200 mL/hr over 30 Minutes Intravenous Every 12 hours 09/29/19 0605 10/05/19 2300   10/13/2019 2200  ceFAZolin (ANCEF) IVPB 2g/100 mL premix     2 g 200 mL/hr over 30 Minutes Intravenous Every 8 hours 10/02/2019 1523 09/28/19 1900   10/16/2019 0600  ceFAZolin (ANCEF) IVPB 2g/100 mL premix     2 g 200 mL/hr over 30 Minutes Intravenous On call to O.R. 10/14/2019 0550 10/16/2019 1338   10/20/2019 0558  ceFAZolin (ANCEF) 2-4 GM/100ML-% IVPB    Note to Pharmacy: Marga Melnick   : cabinet override      10/03/2019 0558 10/12/2019 1814      Assessment/Plan: s/p Procedure(s) with comments: WHIPPLE PROCEDURE (N/A) - GENERAL AND EPIDURAL GASTROJEJUNOSTOMY, J TUBE (N/A) - GENERAL AND EPIDURAL Laparoscopy Diagnostic (N/A)  ABL anemia - stable.   Pancreatic cancer  Check labs again tomorrow. CXR looks worse.  Pt with high nursing needs.  Also with tachycardia and frequent cough would hold on transfer.     LOS: 13 days    Stark Klein 10/10/2019

## 2019-10-10 NOTE — Progress Notes (Addendum)
Discontinued gravity drain in per order. Green drainage as was in gravity drain has saturated several dressings and continues to trickle out continuously. Provider on call paged to notify. Simmie Davies RN   On call provider came to assess situation and indicated to just keep it dressed as best possible and let it drain out. Simmie Davies RN

## 2019-10-11 ENCOUNTER — Inpatient Hospital Stay (HOSPITAL_COMMUNITY): Payer: Medicare HMO

## 2019-10-11 LAB — BASIC METABOLIC PANEL
Anion gap: 11 (ref 5–15)
BUN: 19 mg/dL (ref 8–23)
CO2: 22 mmol/L (ref 22–32)
Calcium: 8.4 mg/dL — ABNORMAL LOW (ref 8.9–10.3)
Chloride: 102 mmol/L (ref 98–111)
Creatinine, Ser: 0.62 mg/dL (ref 0.44–1.00)
GFR calc Af Amer: >60 mL/min (ref >60)
GFR calc non Af Amer: >60 mL/min (ref >60)
Glucose, Bld: 126 mg/dL — ABNORMAL HIGH (ref 70–99)
Potassium: 4.6 mmol/L (ref 3.5–5.1)
Sodium: 135 mmol/L (ref 135–145)

## 2019-10-11 LAB — CBC
HCT: 33.5 % — ABNORMAL LOW (ref 36.0–46.0)
Hemoglobin: 10.7 g/dL — ABNORMAL LOW (ref 12.0–15.0)
MCH: 31.7 pg (ref 26.0–34.0)
MCHC: 31.9 g/dL (ref 30.0–36.0)
MCV: 99.1 fL (ref 80.0–100.0)
Platelets: 349 10*3/uL (ref 150–400)
RBC: 3.38 MIL/uL — ABNORMAL LOW (ref 3.87–5.11)
RDW: 15.3 % (ref 11.5–15.5)
WBC: 15.3 10*3/uL — ABNORMAL HIGH (ref 4.0–10.5)
nRBC: 0 % (ref 0.0–0.2)

## 2019-10-11 LAB — GLUCOSE, CAPILLARY
Glucose-Capillary: 110 mg/dL — ABNORMAL HIGH (ref 70–99)
Glucose-Capillary: 120 mg/dL — ABNORMAL HIGH (ref 70–99)
Glucose-Capillary: 127 mg/dL — ABNORMAL HIGH (ref 70–99)
Glucose-Capillary: 127 mg/dL — ABNORMAL HIGH (ref 70–99)
Glucose-Capillary: 68 mg/dL — ABNORMAL LOW (ref 70–99)
Glucose-Capillary: 74 mg/dL (ref 70–99)
Glucose-Capillary: 79 mg/dL (ref 70–99)

## 2019-10-11 MED ORDER — DEXTROSE 50 % IV SOLN
12.5000 g | INTRAVENOUS | Status: AC
  Administered 2019-10-11: 12:00:00 25 mL via INTRAVENOUS

## 2019-10-11 MED ORDER — DEXTROSE 50 % IV SOLN
INTRAVENOUS | Status: AC
  Administered 2019-10-11: 25 mL via INTRAVENOUS
  Filled 2019-10-11: qty 50

## 2019-10-11 NOTE — TOC Progression Note (Signed)
Transition of Care [facility]) - Progression Note    Patient Details  Name: Julie Rollins MRN: [id number] Date of Birth: 05/22/1946  Transition of Care [facility] CM/SW [place], [place] Phone Number: 10/11/2019, 4:02 PM  Clinical Narrative:    [facility] team following for medical stability. At this time pt not appropriate for placement process/insurance auth. We will follow to facilitate SNF transfer when timing is appropriate.    Expected Discharge Plan: [place] Barriers to Discharge: Continued Medical Work up, Ship broker  Expected Discharge Plan and Services Expected Discharge Plan: [place] In-house Referral: Clinical Social Work Discharge Planning Services: CM Consult Post Acute Care Choice: [place] arrangements for the past 2 months: [place] Determinants of Health (SDOH) Interventions    Readmission Risk Interventions Readmission Risk Prevention Plan 09/30/2019 09/13/2019  Transportation Screening Complete Complete  Medication Review Press photographer) Referral to Pharmacy Referral to Pharmacy  PCP or Specialist appointment within 3-5 days of discharge Complete Not Complete  PCP/Specialist Appt Not Complete comments - pt established at [facility] and with PCP but DC date unknown  [place] or Home Care Consult Complete Complete  SW Recovery Care/Counseling Consult Complete Complete  Palliative Care Screening Not Complete Not Complete  Comments - pt in procedure did not discuss if any past palliative care involvement, however none in current encounter  [place] Complete Not Applicable  Some recent data might be hidden

## 2019-10-11 NOTE — Progress Notes (Addendum)
Hypoglycemic event. Hypoglycemic Event  CBG: 68 1150   Treatment:Hypoglycemice Standing Orders fro CBG 69. 12.5 GM D50 IV (25 ML) at [id number] as patient is NPO, with NG to LIS.   Symptoms: None Drowsy, but had been that way overnight. 0800 CBG was 79.   Follow-up CBG: Time:1240 CBG Result 120   Possible Reasons for Event: Pt NPO, tube feedings stopped overnight due to NG placement with 1600 ml out immediately. Pt drowsy, but has not been taking PO in several days. IVF D5 1/2NS 20KCL at 50CC/HRVomiting, Inadequate meal intake and Other: Surgery  Comments/MD notified:Dr. Barry Dienes paged at 1234.  Repaged at 1541. CBG at 74 now. 1540. Office notified and Dr Sherlene Shams is covering and called back. Explained situation, he stated he will review chart and call back or place orders if needed. Pt more alert and oriented.   Order received to increase D5 1/2 20 KCL to 124ml/hr.   Blood sugar around 1700 74. Reported situation to oncoming RN Linton Ham RN. At 1800 patient is awake, alert, and oriented. Mews score improve to green then  Yellow. [place] RN

## 2019-10-11 NOTE — Progress Notes (Signed)
PT Cancellation Note  Patient Details Name: Julie Rollins MRN: [id number] DOB: 12-05-1945   Cancelled Treatment:    Reason Eval/Treat Not Completed: Patient not medically ready. Pt very lethargic, had 1536ml of bile eliminated via NG tube last night. Pt's blood sugar at 68. RN asked to hold at this time. Acute PT to return as able to progress mobility.  Kittie Plater, PT, DPT Acute Rehabilitation Services Pager #: [phone] Office #: [phone]    [place] Lopes 10/11/2019, 12:02 PM

## 2019-10-11 NOTE — Progress Notes (Signed)
Upon passing meds at 0200, the patient was more lethargic than my previous assessment, although she was still AOx4. The patient was vomiting green emesis as she previously had been and her abdomen was distended. The doctor was paged and arrived in the room shortly after to assess the situation. She ordered an NG tube to low intermittent suction. The NGT was inserted into the left nare after a failed attempt at insertion into the right nare. The patient put out 1600 mLs of green fluid. Placement was confirmed with a KUB and tube feedings have been stopped as ordered. Patient responded well to the intervention and is resting now with stable vitals.

## 2019-10-11 NOTE — Progress Notes (Signed)
Nightly dose of Levemir held and approved by oncall doctor given that the patient's tube feeds have been stopped and she had a hypoglycemic event earlier today in which a dextrose injection was administered. Will continue to closely monitor sugars every 4 hours.

## 2019-10-11 NOTE — Progress Notes (Signed)
OT Cancellation Note  Patient Details Name: Julie Rollins MRN: [id number] DOB: Feb 14, 1946   Cancelled Treatment:    Reason Eval/Treat Not Completed: Patient not medically ready  Pt very lethargic, had 1561ml of bile eliminated via NG tube last night. Pt's blood sugar at 68, RN requesting hold on therapies. Will continue to follow per OT POC as pt available and appropriate.  Zenovia Jarred, MSOT, OTR/L Behavioral Health OT/ Acute Relief OT [facility] Office: [phone]  Zenovia Jarred 10/11/2019, 1:24 PM

## 2019-10-11 NOTE — Progress Notes (Signed)
14 Days Post-Op   Subjective/Chief Complaint: Had large episode of bilious emesis after which the nurse was concerned about aspiration last night.  NGT replaced.  She is doing better now.     Objective: Vital signs in last 24 hours: Temp:  [97.5 F (36.4 C)-98.3 F (36.8 C)] 97.7 F (36.5 C) (11/17 0846) Pulse Rate:  [112-120] 112 (11/17 0846) Resp:  [16-24] 19 (11/17 0846) BP: (125-148)/(47-82) 127/47 (11/17 0846) SpO2:  [94 %-99 %] 97 % (11/17 0846) Weight:  [72 kg] 72 kg (11/17 0500) Last BM Date: 10/09/19  Intake/Output from previous day: 11/16 0701 - 11/17 0700 In: 100 [P.O.:100] Out: 2000 [Emesis/NG output:1900; Drains:100] Intake/Output this shift: No intake/output data recorded.  Exam: Awake and alert in NAD cv tachy, but regular Abdomen soft, minimally distended.  Drains out.   J tube in place.  Tube feeds on hold.    Lab Results:  Recent Labs    10/09/19 0522 10/11/19 0500  WBC 17.6* 15.3*  HGB 10.9* 10.7*  HCT 34.2* 33.5*  PLT 383 349   BMET Recent Labs    10/09/19 0522 10/11/19 0500  NA 139 135  K 4.5 4.6  CL 106 102  CO2 23 22  GLUCOSE 141* 126*  BUN 17 19  CREATININE 0.52 0.62  CALCIUM 8.6* 8.4*   PT/INR No results for input(s): LABPROT, INR in the last 72 hours. ABG No results for input(s): PHART, HCO3 in the last 72 hours.  Invalid input(s): PCO2, PO2  Studies/Results: Dg Chest Port 1 View  Result Date: 10/09/2019 CLINICAL DATA:  Wheezing EXAM: PORTABLE CHEST 1 VIEW COMPARISON:  10/05/2019 FINDINGS: Interval increase in retrocardiac atelectasis or consolidation of the left lung. Otherwise unchanged mild, diffuse interstitial opacity. Mild cardiomegaly. Right neck vascular catheter. IMPRESSION: 1. Interval increase in retrocardiac atelectasis or consolidation of the left lung. 2. Otherwise unchanged mild, diffuse interstitial opacity, consistent with edema or infection. Electronically Signed   By: Eddie Candle M.D.   On: 10/09/2019  17:25   Dg Abd Portable 1v  Result Date: 10/11/2019 CLINICAL DATA:  NG tube placement EXAM: PORTABLE ABDOMEN - 1 VIEW COMPARISON:  June 13, 2019 FINDINGS: NG tube tip is in the stomach. Mild gaseous distention of the colon. IMPRESSION: NG tube tip in the stomach. Electronically Signed   By: Rolm Baptise M.D.   On: 10/11/2019 03:03    Anti-infectives: Anti-infectives (From admission, onward)   Start     Dose/Rate Route Frequency Ordered Stop   10/02/19 1858  vancomycin (VANCOCIN) IVPB 1000 mg/200 mL premix     1,000 mg 200 mL/hr over 60 Minutes Intravenous Every 12 hours 10/02/19 1533 10/05/19 1944   09/29/19 0630  vancomycin (VANCOCIN) IVPB 1000 mg/200 mL premix  Status:  Discontinued     1,000 mg 200 mL/hr over 60 Minutes Intravenous Every 24 hours 09/29/19 0625 10/02/19 1533   09/29/19 0615  ceFEPIme (MAXIPIME) 2 g in sodium chloride 0.9 % 100 mL IVPB     2 g 200 mL/hr over 30 Minutes Intravenous Every 12 hours 09/29/19 0605 10/05/19 2300   10/21/2019 2200  ceFAZolin (ANCEF) IVPB 2g/100 mL premix     2 g 200 mL/hr over 30 Minutes Intravenous Every 8 hours 09/25/2019 1523 09/28/19 1900   09/26/2019 0600  ceFAZolin (ANCEF) IVPB 2g/100 mL premix     2 g 200 mL/hr over 30 Minutes Intravenous On call to O.R. 10/08/2019 0550 10/06/2019 1338   09/25/2019 0558  ceFAZolin (ANCEF) 2-4 GM/100ML-% IVPB  Note to Pharmacy: Marga Melnick   : cabinet override      10/20/2019 0558 10/20/2019 1814      Assessment/Plan: s/p Procedure(s) with comments: WHIPPLE PROCEDURE (N/A) - GENERAL AND EPIDURAL GASTROJEJUNOSTOMY, J TUBE (N/A) - GENERAL AND EPIDURAL Laparoscopy Diagnostic (N/A)  ABL anemia - stable.   Pancreatic cancer  Check labs again tomorrow. Hold tube feeds.  NGT/NPO  If not improved tomorrow from GI standpoint, will get CTs tomorrow.    Leave in stepdown due to recent decline.     LOS: 14 days    Stark Klein 10/11/2019

## 2019-10-12 LAB — CBC
HCT: 32.4 % — ABNORMAL LOW (ref 36.0–46.0)
Hemoglobin: 10.2 g/dL — ABNORMAL LOW (ref 12.0–15.0)
MCH: 31.5 pg (ref 26.0–34.0)
MCHC: 31.5 g/dL (ref 30.0–36.0)
MCV: 100 fL (ref 80.0–100.0)
Platelets: 359 10*3/uL (ref 150–400)
RBC: 3.24 MIL/uL — ABNORMAL LOW (ref 3.87–5.11)
RDW: 15.5 % (ref 11.5–15.5)
WBC: 8.6 10*3/uL (ref 4.0–10.5)
nRBC: 0 % (ref 0.0–0.2)

## 2019-10-12 LAB — BASIC METABOLIC PANEL
Anion gap: 7 (ref 5–15)
BUN: 15 mg/dL (ref 8–23)
CO2: 24 mmol/L (ref 22–32)
Calcium: 8.2 mg/dL — ABNORMAL LOW (ref 8.9–10.3)
Chloride: 108 mmol/L (ref 98–111)
Creatinine, Ser: 0.5 mg/dL (ref 0.44–1.00)
GFR calc Af Amer: >60 mL/min (ref >60)
GFR calc non Af Amer: >60 mL/min (ref >60)
Glucose, Bld: 138 mg/dL — ABNORMAL HIGH (ref 70–99)
Potassium: 3.9 mmol/L (ref 3.5–5.1)
Sodium: 139 mmol/L (ref 135–145)

## 2019-10-12 LAB — GLUCOSE, CAPILLARY
Glucose-Capillary: 142 mg/dL — ABNORMAL HIGH (ref 70–99)
Glucose-Capillary: 113 mg/dL — ABNORMAL HIGH (ref 70–99)
Glucose-Capillary: 128 mg/dL — ABNORMAL HIGH (ref 70–99)
Glucose-Capillary: 141 mg/dL — ABNORMAL HIGH (ref 70–99)
Glucose-Capillary: 96 mg/dL (ref 70–99)

## 2019-10-12 MED ORDER — BISACODYL 10 MG RE SUPP
10.0000 mg | Freq: Once | RECTAL | Status: AC
  Administered 2019-10-12: 10 mg via RECTAL
  Filled 2019-10-12: qty 1

## 2019-10-12 MED ORDER — METOCLOPRAMIDE HCL 5 MG/ML IJ SOLN
5.0000 mg | Freq: Three times a day (TID) | INTRAMUSCULAR | Status: DC
  Administered 2019-10-12 – 2019-10-14 (×7): 5 mg via INTRAVENOUS
  Filled 2019-10-12 (×7): qty 2

## 2019-10-12 NOTE — Progress Notes (Signed)
Nutrition Follow-up  DOCUMENTATION CODES:   Not applicable  INTERVENTION:  Once able to restart tube feeding, Recommend Osmolite 1.5 formula via J-tube at 30 ml/hr and increase by 10 ml every 8 hours (per MD orders) to goal rate of 50 ml/hr.  Provide 30 ml Prostat BID via J-tube.   Tube feeding to provide 2000 kcal (100% of needs), 105 grams of protein, and 912 ml water.   NUTRITION DIAGNOSIS:   Increased nutrient needs related to cancer and cancer related treatments as evidenced by estimated needs; ongoing  GOAL:   Patient will meet greater than or equal to 90% of their needs; not met  MONITOR:   Diet advancement, Skin, TF tolerance, Weight trends, Labs, I & O's  REASON FOR ASSESSMENT:   Consult Enteral/tube feeding initiation and management  ASSESSMENT:   Pt with PMH of breast cancer s/p lumpectomy and XRT, hypothyroidism, HTN, dx with pancreatic cancer May 2020 s/p ERCP with stent, chemo (complicated by nausea, weight loss, duodenal obstruction requiring TPN) now admitted for pancreaticoduodenectomy with cholecystectomy with removal of 4 cm from pancreatic head. Pt developed acute hypoxic respiratory failure from HCAP and anemia after surgery.   Procedure(11/3): WHIPPLE PROCEDURE (Abdomen) GASTROJEJUNOSTOMY, J TUBE (Abdomen) for adenocarcinoma of the pancreas, s/p neoadjuvant chemo and neoadjuvant SBRT   Pt with large emesis 11/17. Pt made NPO. NGT placed to suction with large output 1250 ml. Tube feeding put on hold yesterday. Pt with improved pain today. Per MD, plans to restart tube feeds tomorrow with orders for slow advancement to ensure tolerance. RD to continue to monitor. Labs and medications reviewed.   Diet Order:   Diet Order            Diet NPO time specified Except for: Ice Chips, Sips with Meds  Diet effective now              EDUCATION NEEDS:   No education needs have been identified at this time  Skin:  Skin Assessment: Skin Integrity  Issues: Skin Integrity Issues:: Incisions Incisions: abdomen  Last BM:  11/16  Height:   Ht Readings from Last 1 Encounters:  10/08/2019 '5\' 4"'$  (1.626 m)    Weight:   Wt Readings from Last 1 Encounters:  10/12/19 74.4 kg    Ideal Body Weight:  54.5 kg  BMI:  Body mass index is 28.15 kg/m.  Estimated Nutritional Needs:   Kcal:  2000-2200  Protein:  100-115 grams  Fluid:  2L/day    Corrin Parker, MS, RD, LDN Pager # [phone] After hours/ weekend pager # [phone]

## 2019-10-12 NOTE — Progress Notes (Signed)
Physical Therapy Treatment Patient Details Name: Julie Rollins MRN: [id number] DOB: [birth year] Today's Date: 10/12/2019    History of Present Illness 73 y.o. female with medical history significant of pancreatic cancer, breast cancer status post lumpectomy and radiation, hypertension, hypothyroidism admitted s/p Whipple procedure.    PT Comments    Pt in much better spirits today and pain much better controlled. Pt demeanor also improved. Pt initiating able to assist with transfers and was able to std pvt to [facility] and chair with minAx2. Ambulation limited to NG hooked to wall and not being able to clamp due to excessive drainage. Acute PT to cont to follow and progress ambulation.    Follow Up Recommendations  SNF     Equipment Recommendations  None recommended by PT    Recommendations for Other Services       Precautions / Restrictions Precautions Precautions: Fall;Other (comment) Precaution Comments: pt with NG tube draining alot of bile Restrictions Weight Bearing Restrictions: No    Mobility  Bed Mobility Overal bed mobility: Needs Assistance Bed Mobility: Supine to Sit     Supine to sit: Mod assist;+2 for physical assistance     General bed mobility comments: HOB elevated, modA for trunk elevation and LE managment, pt able to assist more today than Monday  Transfers Overall transfer level: Needs assistance Equipment used: Rolling walker (2 wheeled) Transfers: Sit to/from [facility] Sit to Stand: Min assist;+2 physical assistance;+2 safety/equipment Stand pivot transfers: Min assist;+2 physical assistance;+2 safety/equipment       General transfer comment: pt powered up well, assist for line managment, pt able to move LEs much better and clear feet better than monday  Ambulation/Gait             General Gait Details: ambulation limited by NG tube hooked up to the wall  and can't be clamped due to excessive drainage   Stairs             Wheelchair Mobility    Modified Rankin (Stroke Patients Only)       Balance Overall balance assessment: Needs assistance Sitting-balance support: Feet supported;No upper extremity supported Sitting balance-Leahy Scale: Fair Sitting balance - Comments: able to maintain upright sitting balance for short period of time   Standing balance support: During functional activity;Bilateral upper extremity supported Standing balance-Leahy Scale: Fair Standing balance comment: dependent on physical assist or RW                            Cognition Arousal/Alertness: Awake/alert Behavior During Therapy: WFL for tasks assessed/performed Overall Cognitive Status: Within Functional Limits for tasks assessed                                 General Comments: pt in much better spirits and not as anxious      Exercises      General Comments General comments (skin integrity, edema, etc.): pt with dressings intact. assisted to [facility], pt urinated and had BM. pt unable to perform hygiene      Pertinent Vitals/Pain Pain Assessment: Faces Faces Pain Scale: Hurts little more Pain Location: abdomen Pain Descriptors / Indicators: Discomfort;Operative site guarding Pain Intervention(s): Monitored during session    Home Living                      Prior Function  PT Goals (current goals can now be found in the care plan section) Progress towards PT goals: Progressing toward goals    Frequency    Min 3X/week      PT Plan Current plan remains appropriate    Co-evaluation PT/OT/SLP Co-Evaluation/Treatment: Yes Reason for Co-Treatment: Complexity of the patient's impairments (multi-system involvement)          AM-PAC PT "6 Clicks" Mobility   Outcome Measure  Help needed turning from your back to your side while in a flat bed without using bedrails?: A Lot Help needed moving from lying on your back to sitting on the side of a flat  bed without using bedrails?: A Lot Help needed moving to and from a bed to a chair (including a wheelchair)?: A Lot Help needed standing up from a chair using your arms (e.g., wheelchair or bedside chair)?: A Little Help needed to walk in hospital room?: A Little Help needed climbing 3-5 steps with a railing? : Total 6 Click Score: 13    End of Session Equipment Utilized During Treatment: Oxygen Activity Tolerance: Patient tolerated treatment well Patient left: in chair;with call bell/phone within reach;with chair alarm set Nurse Communication: Mobility status PT Visit Diagnosis: Difficulty in walking, not elsewhere classified (R26.2);Muscle weakness (generalized) (M62.81)     Time: [id number] PT Time Calculation (min) (ACUTE ONLY): 30 min  Charges:  $Therapeutic Activity: 8-22 mins                     Kittie Plater, PT, DPT Acute Rehabilitation Services Pager #: [phone] Office #: [phone]    Berline Lopes 10/12/2019, 1:36 PM

## 2019-10-12 NOTE — Progress Notes (Signed)
15 Days Post-Op   Subjective/Chief Complaint: Pt's NGT still put out quite a bit overnight.  Her pain is much better today.  She is passing gas, but hasn't had a BM in a few days.     Objective: Vital signs in last 24 hours: Temp:  [97.6 F (36.4 C)-98.6 F (37 C)] 98.6 F (37 C) (11/18 1140) Pulse Rate:  [101-110] 102 (11/18 1140) Resp:  [18-22] 20 (11/18 1140) BP: (129-134)/(51-59) 129/55 (11/18 1140) SpO2:  [93 %-99 %] 93 % (11/18 1140) Weight:  [74.4 kg] 74.4 kg (11/18 0454) Last BM Date: 10/10/19  Intake/Output from previous day: 11/17 0701 - 11/18 0700 In: 0  Out: 1850 [Urine:600; Emesis/NG output:1250] Intake/Output this shift: Total I/O In: 60 [P.O.:60] Out: 600 [Urine:600]  Exam: Awake and alert in NAD cv tachy, but regular Abdomen soft, minimally distended.  Drains out.   J tube in place.  Tube feeds on hold.    Lab Results:  Recent Labs    10/11/19 0500 10/12/19 0526  WBC 15.3* 8.6  HGB 10.7* 10.2*  HCT 33.5* 32.4*  PLT 349 359   BMET Recent Labs    10/11/19 0500 10/12/19 0526  NA 135 139  K 4.6 3.9  CL 102 108  CO2 22 24  GLUCOSE 126* 138*  BUN 19 15  CREATININE 0.62 0.50  CALCIUM 8.4* 8.2*   PT/INR No results for input(s): LABPROT, INR in the last 72 hours. ABG No results for input(s): PHART, HCO3 in the last 72 hours.  Invalid input(s): PCO2, PO2  Studies/Results: Dg Abd Portable 1v  Result Date: 10/11/2019 CLINICAL DATA:  NG tube placement EXAM: PORTABLE ABDOMEN - 1 VIEW COMPARISON:  June 13, 2019 FINDINGS: NG tube tip is in the stomach. Mild gaseous distention of the colon. IMPRESSION: NG tube tip in the stomach. Electronically Signed   By: Rolm Baptise M.D.   On: 10/11/2019 03:03    Anti-infectives: Anti-infectives (From admission, onward)   Start     Dose/Rate Route Frequency Ordered Stop   10/02/19 1858  vancomycin (VANCOCIN) IVPB 1000 mg/200 mL premix     1,000 mg 200 mL/hr over 60 Minutes Intravenous Every 12 hours  10/02/19 1533 10/05/19 1944   09/29/19 0630  vancomycin (VANCOCIN) IVPB 1000 mg/200 mL premix  Status:  Discontinued     1,000 mg 200 mL/hr over 60 Minutes Intravenous Every 24 hours 09/29/19 0625 10/02/19 1533   09/29/19 0615  ceFEPIme (MAXIPIME) 2 g in sodium chloride 0.9 % 100 mL IVPB     2 g 200 mL/hr over 30 Minutes Intravenous Every 12 hours 09/29/19 0605 10/05/19 2300   10/09/2019 2200  ceFAZolin (ANCEF) IVPB 2g/100 mL premix     2 g 200 mL/hr over 30 Minutes Intravenous Every 8 hours 09/30/2019 1523 09/28/19 1900   10/04/2019 0600  ceFAZolin (ANCEF) IVPB 2g/100 mL premix     2 g 200 mL/hr over 30 Minutes Intravenous On call to O.R. 09/26/2019 0550 10/02/2019 1338   10/18/2019 0558  ceFAZolin (ANCEF) 2-4 GM/100ML-% IVPB    Note to Pharmacy: Marga Melnick   : cabinet override      10/10/2019 0558 10/08/2019 1814      Assessment/Plan: s/p Procedure(s) with comments: WHIPPLE PROCEDURE (N/A) - GENERAL AND EPIDURAL GASTROJEJUNOSTOMY, J TUBE (N/A) - GENERAL AND EPIDURAL Laparoscopy Diagnostic (N/A)  ABL anemia - stable.   Pancreatic cancer  Labs look good today.   Hold tube feeds.  NGT/NPO Suppository today and switch back to IV  reglan.  Will plan to restart tube feeds tomorrow.     LOS: 15 days    Stark Klein 10/12/2019

## 2019-10-12 NOTE — Progress Notes (Signed)
Occupational Therapy Treatment Patient Details Name: Julie Rollins MRN: [id number] DOB: 06/22/1946 Today's Date: 10/12/2019    History of present illness 73 y.o. female with medical history significant of pancreatic cancer, breast cancer status post lumpectomy and radiation, hypertension, hypothyroidism admitted s/p Whipple procedure.   OT comments  Pt progressing this session, but having urgent need for [facility] for urination and BM. Pt modA +2 for bed mobility; minA +2 for stand pivot to [facility] and to ambulate a few steps to recliner. Pt maxA for toilet hygiene and minguardA for standing for hygiene. Pt tolerating session well with usual pain reported, but not increased and decreased anxiety this session. Pt would benefit from continued OT skilled services for ADL, mobility and safety in SNF setting. OT following.    Follow Up Recommendations  SNF;Supervision/Assistance - 24 hour    Equipment Recommendations  3 in 1 bedside commode;Hospital bed    Recommendations for Other Services      Precautions / Restrictions Precautions Precautions: Fall;Other (comment) Precaution Comments: pt with NG tube draining alot of bile Restrictions Weight Bearing Restrictions: No       Mobility Bed Mobility Overal bed mobility: Needs Assistance Bed Mobility: Supine to Sit     Supine to sit: Mod assist;+2 for physical assistance     General bed mobility comments: HOB elevated, pt rolling to L side and requiring ModA +2 for bed mobility for trunk elevation and BLE management  Transfers Overall transfer level: Needs assistance Equipment used: Rolling walker (2 wheeled) Transfers: Sit to/from [facility] Sit to Stand: Min assist;+2 physical assistance;+2 safety/equipment Stand pivot transfers: Min assist;+2 physical assistance;+2 safety/equipment       General transfer comment: Pt minA for power up and verbal cues for hand placement from [facility] to RW.    Balance Overall balance  assessment: Needs assistance Sitting-balance support: Feet supported;No upper extremity supported Sitting balance-Leahy Scale: Fair Sitting balance - Comments: able to maintain upright sitting balance for short period of time   Standing balance support: During functional activity;Bilateral upper extremity supported Standing balance-Leahy Scale: Fair Standing balance comment: dependent on physical assist or RW                           ADL either performed or assessed with clinical judgement   ADL Overall ADL's : Needs assistance/impaired     Grooming: Set up;Sitting                   Toilet Transfer: Minimal assistance;+2 for physical assistance;+2 for safety/equipment;Cueing for safety;Stand-pivot Toilet Transfer Details (indicate cue type and reason): emergent need to transfer to [facility] so pt did not ambulate to [facility] Toileting- Clothing Manipulation and Hygiene: Maximal assistance Toileting - Clothing Manipulation Details (indicate cue type and reason): Pt able to pitch body forward, but pt did not attempt to clean self.     Functional mobility during ADLs: Minimal assistance;Rolling walker General ADL Comments: Pt motivated to get OOB to recliner today     Vision   Vision Assessment?: No apparent visual deficits   Perception     Praxis      Cognition Arousal/Alertness: Awake/alert Behavior During Therapy: WFL for tasks assessed/performed Overall Cognitive Status: Within Functional Limits for tasks assessed                                 General Comments: WFLs, no anxiety  at this time        Exercises     Shoulder Instructions       General Comments Urinated and have BM    Pertinent Vitals/ Pain       Pain Assessment: Faces Faces Pain Scale: Hurts little more Pain Location: abdomen Pain Descriptors / Indicators: Discomfort;Operative site guarding Pain Intervention(s): Monitored during session  Home Living                                           Prior Functioning/Environment              Frequency  Min 2X/week        Progress Toward Goals  OT Goals(current goals can now be found in the care plan section)  Progress towards OT goals: Progressing toward goals  Acute Rehab OT Goals Patient Stated Goal: go home OT Goal Formulation: With patient Time For Goal Achievement: 10/29/19 Potential to Achieve Goals: Good ADL Goals Pt Will Perform Grooming: with supervision;standing Pt Will Perform Upper Body Dressing: sitting;with supervision Pt Will Perform Lower Body Dressing: with min guard assist;sit to/from stand Pt Will Transfer to Toilet: with min guard assist;ambulating;bedside commode Pt Will Perform Toileting - Clothing Manipulation and hygiene: with min guard assist;sit to/from stand Pt Will Perform Tub/Shower Transfer: Tub transfer;with min guard assist;ambulating;3 in 1;rolling walker Additional ADL Goal #1: Pt will complete one ADL in standing with less than 10% cues for pt safety.  Plan Discharge plan remains appropriate    Co-evaluation    PT/OT/SLP Co-Evaluation/Treatment: Yes Reason for Co-Treatment: Complexity of the patient's impairments (multi-system involvement)   OT goals addressed during session: ADL's and self-care      AM-PAC OT "6 Clicks" Daily Activity     Outcome Measure   Help from another person eating meals?: None Help from another person taking care of personal grooming?: A Little Help from another person toileting, which includes using toliet, bedpan, or urinal?: A Little Help from another person bathing (including washing, rinsing, drying)?: A Little Help from another person to put on and taking off regular upper body clothing?: A Little Help from another person to put on and taking off regular lower body clothing?: A Little 6 Click Score: 19    End of Session Equipment Utilized During Treatment: Rolling walker  OT Visit Diagnosis:  Unsteadiness on feet (R26.81);Muscle weakness (generalized) (M62.81);Pain Pain - part of body: (abdomen)   Activity Tolerance Patient tolerated treatment well   Patient Left in chair;with call bell/phone within reach;with chair alarm set   Nurse Communication Mobility status;Precautions        Time: [id number] OT Time Calculation (min): 31 min  Charges: OT General Charges $OT Visit: 1 Visit OT Treatments $Self Care/Home Management : 8-22 mins  Darryl Nestle) Marsa Aris OTR/L Acute Rehabilitation Services Pager: [phone] Office: [phone]   Audie Pinto 10/12/2019, 4:46 PM

## 2019-10-13 LAB — GLUCOSE, CAPILLARY
Glucose-Capillary: 113 mg/dL — ABNORMAL HIGH (ref 70–99)
Glucose-Capillary: 121 mg/dL — ABNORMAL HIGH (ref 70–99)
Glucose-Capillary: 128 mg/dL — ABNORMAL HIGH (ref 70–99)
Glucose-Capillary: 131 mg/dL — ABNORMAL HIGH (ref 70–99)
Glucose-Capillary: 136 mg/dL — ABNORMAL HIGH (ref 70–99)
Glucose-Capillary: 144 mg/dL — ABNORMAL HIGH (ref 70–99)

## 2019-10-13 NOTE — Progress Notes (Signed)
16 Days Post-Op   Subjective/Chief Complaint: Pt continuing to feel better.      Objective: Vital signs in last 24 hours: Temp:  [97.5 F (36.4 C)-98.8 F (37.1 C)] 98.8 F (37.1 C) (11/19 0800) Pulse Rate:  [88-102] 88 (11/19 0800) Resp:  [15-24] 15 (11/19 0800) BP: (110-143)/(44-65) 139/55 (11/19 0800) SpO2:  [93 %-100 %] 98 % (11/19 0800) Weight:  [74 kg] 74 kg (11/19 0430) Last BM Date: 10/10/19  Intake/Output from previous day: 11/18 0701 - 11/19 0700 In: 2353 [P.O.:60; I.V.:2293] Out: 2550 [Urine:1350; Emesis/NG output:1200] Intake/Output this shift: No intake/output data recorded.  Exam: Awake and alert in NAD cv tachy, but regular Abdomen soft, minimally distended. Drain sites clear.   J tube in place.  Tube feeds on hold.    Lab Results:  Recent Labs    10/11/19 0500 10/12/19 0526  WBC 15.3* 8.6  HGB 10.7* 10.2*  HCT 33.5* 32.4*  PLT 349 359   BMET Recent Labs    10/11/19 0500 10/12/19 0526  NA 135 139  K 4.6 3.9  CL 102 108  CO2 22 24  GLUCOSE 126* 138*  BUN 19 15  CREATININE 0.62 0.50  CALCIUM 8.4* 8.2*   PT/INR No results for input(s): LABPROT, INR in the last 72 hours. ABG No results for input(s): PHART, HCO3 in the last 72 hours.  Invalid input(s): PCO2, PO2  Studies/Results: No results found.  Anti-infectives: Anti-infectives (From admission, onward)   Start     Dose/Rate Route Frequency Ordered Stop   10/02/19 1858  vancomycin (VANCOCIN) IVPB 1000 mg/200 mL premix     1,000 mg 200 mL/hr over 60 Minutes Intravenous Every 12 hours 10/02/19 1533 10/05/19 1944   09/29/19 0630  vancomycin (VANCOCIN) IVPB 1000 mg/200 mL premix  Status:  Discontinued     1,000 mg 200 mL/hr over 60 Minutes Intravenous Every 24 hours 09/29/19 0625 10/02/19 1533   09/29/19 0615  ceFEPIme (MAXIPIME) 2 g in sodium chloride 0.9 % 100 mL IVPB     2 g 200 mL/hr over 30 Minutes Intravenous Every 12 hours 09/29/19 0605 10/05/19 2300   09/26/2019 2200   ceFAZolin (ANCEF) IVPB 2g/100 mL premix     2 g 200 mL/hr over 30 Minutes Intravenous Every 8 hours 10/21/2019 1523 09/28/19 1900   10/18/2019 0600  ceFAZolin (ANCEF) IVPB 2g/100 mL premix     2 g 200 mL/hr over 30 Minutes Intravenous On call to O.R. 10/24/2019 0550 10/11/2019 1338   10/14/2019 0558  ceFAZolin (ANCEF) 2-4 GM/100ML-% IVPB    Note to Pharmacy: Marga Melnick   : cabinet override      09/29/2019 0558 10/09/2019 1814      Assessment/Plan: s/p Procedure(s) with comments: WHIPPLE PROCEDURE (N/A) - GENERAL AND EPIDURAL GASTROJEJUNOSTOMY, J TUBE (N/A) - GENERAL AND EPIDURAL Laparoscopy Diagnostic (N/A)  ABL anemia - stable.   Pancreatic cancer  Restart tube feeds at 20 ml/hr.  Clamp NGT.      LOS: 16 days    Stark Klein 10/13/2019

## 2019-10-13 NOTE — Care Management Important Message (Signed)
Important Message  Patient Details  Name: Julie Rollins MRN: [id number] Date of Birth: 12/03/1945   Medicare Important Message Given:  Yes     [name] 10/13/2019, 8:21 AM

## 2019-10-13 NOTE — Progress Notes (Signed)
Physical Therapy Treatment Patient Details Name: Julie Rollins MRN: [id number] DOB: January 11, 1946 Today's Date: 10/13/2019    History of Present Illness 73 y.o. female with medical history significant of pancreatic cancer, breast cancer status post lumpectomy and radiation, hypertension, hypothyroidism admitted s/p Whipple procedure.    PT Comments    Patient appeared brighter with more energy this date. She was able to walk 60 ft at one time, seated rest and walked 25 ft more. She continues to require cues for safe use of RW and upright posture.    Follow Up Recommendations  SNF     Equipment Recommendations  None recommended by PT    Recommendations for Other Services       Precautions / Restrictions Precautions Precautions: Fall;Other (comment) Precaution Comments: NG tube clamped Restrictions Weight Bearing Restrictions: No    Mobility  Bed Mobility Overal bed mobility: Needs Assistance Bed Mobility: Supine to Sit     Supine to sit: Supervision;HOB elevated(elevated 30)     General bed mobility comments: incr time and effort   Transfers Overall transfer level: Needs assistance Equipment used: Rolling walker (2 wheeled) Transfers: Sit to/from [facility] Sit to Stand: Min guard Stand pivot transfers: Min guard       General transfer comment: no physical assist x 5 transfers throughout session  Ambulation/Gait Ambulation/Gait assistance: +2 safety/equipment;Min guard(chair follow) Gait Distance (Feet): 60 Feet(seated rest; 25 ft) Assistive device: Rolling walker (2 wheeled) Gait Pattern/deviations: Decreased stride length;Step-through pattern;Trunk flexed Gait velocity: slow   General Gait Details: vc for upright posture and proximity to RW; pt decides she is tired and wants to sit with little warning;   Stairs             Wheelchair Mobility    Modified Rankin (Stroke Patients Only)       Balance Overall balance  assessment: Needs assistance Sitting-balance support: Feet supported;No upper extremity supported Sitting balance-Leahy Scale: Fair     Standing balance support: During functional activity;Bilateral upper extremity supported Standing balance-Leahy Scale: Poor                              Cognition Arousal/Alertness: Awake/alert Behavior During Therapy: WFL for tasks assessed/performed Overall Cognitive Status: Within Functional Limits for tasks assessed                                 General Comments: WFLs, no anxiety at this time      Exercises General Exercises - Lower Extremity Ankle Circles/Pumps: AROM;Both;10 reps    General Comments General comments (skin integrity, edema, etc.): on 2L with sats >94%; max HR 118.       Pertinent Vitals/Pain Pain Assessment: Faces Faces Pain Scale: Hurts little more Pain Location: abdomen Pain Descriptors / Indicators: Discomfort;Operative site guarding Pain Intervention(s): Limited activity within patient's tolerance;Monitored during session;Premedicated before session;Repositioned    Home Living                      Prior Function            PT Goals (current goals can now be found in the care plan section) Acute Rehab PT Goals Patient Stated Goal: go home Time For Goal Achievement: 10/15/19 Potential to Achieve Goals: Good Progress towards PT goals: Progressing toward goals    Frequency    Min 3X/week  PT Plan Current plan remains appropriate    Co-evaluation              AM-PAC PT "6 Clicks" Mobility   Outcome Measure  Help needed turning from your back to your side while in a flat bed without using bedrails?: A Little Help needed moving from lying on your back to sitting on the side of a flat bed without using bedrails?: A Little Help needed moving to and from a bed to a chair (including a wheelchair)?: A Little     Help needed climbing 3-5 steps with a railing?  : A Lot 6 Click Score: 11    End of Session Equipment Utilized During Treatment: Oxygen Activity Tolerance: Patient tolerated treatment well Patient left: in chair;with call bell/phone within reach;with chair alarm set Nurse Communication: Mobility status PT Visit Diagnosis: Difficulty in walking, not elsewhere classified (R26.2);Muscle weakness (generalized) (M62.81)     Time: [id number] PT Time Calculation (min) (ACUTE ONLY): 32 min  Charges:  $Gait Training: 23-37 mins                      Barry Brunner, PT Pager [phone]    Rexanne Mano 10/13/2019, 4:39 PM

## 2019-10-14 LAB — GLUCOSE, CAPILLARY
Glucose-Capillary: 101 mg/dL — ABNORMAL HIGH (ref 70–99)
Glucose-Capillary: 115 mg/dL — ABNORMAL HIGH (ref 70–99)
Glucose-Capillary: 120 mg/dL — ABNORMAL HIGH (ref 70–99)
Glucose-Capillary: 144 mg/dL — ABNORMAL HIGH (ref 70–99)
Glucose-Capillary: 158 mg/dL — ABNORMAL HIGH (ref 70–99)
Glucose-Capillary: 97 mg/dL (ref 70–99)

## 2019-10-14 MED ORDER — METOCLOPRAMIDE HCL 5 MG/ML IJ SOLN
10.0000 mg | Freq: Three times a day (TID) | INTRAMUSCULAR | Status: DC
  Administered 2019-10-14 – 2019-10-17 (×9): 10 mg via INTRAVENOUS
  Filled 2019-10-14 (×9): qty 2

## 2019-10-14 NOTE — Progress Notes (Signed)
Pt  abd distended, hard and tender to touch, vomited moderate amt of greenish emesis during this shift, reattached ng to low suctioned approx 1239ml noted in canister at this time. Iv zofran given as per prn order. Pt resting quietly with eyes closed at this time, ng remain attached to suction and draining greenish emesis in canister.

## 2019-10-14 NOTE — Progress Notes (Signed)
17 Days Post-Op   Subjective/Chief Complaint: Started spitting up again with NGT clamped.      Objective: Vital signs in last 24 hours: Temp:  [97.7 F (36.5 C)-98 F (36.7 C)] 97.7 F (36.5 C) (11/20 0800) Pulse Rate:  [99-115] 105 (11/20 0800) Resp:  [19-24] 19 (11/20 0800) BP: (147-159)/(65-79) 159/76 (11/20 0800) SpO2:  [95 %-98 %] 96 % (11/20 0800) Last BM Date: 10/10/19  Intake/Output from previous day: 11/19 0701 - 11/20 0700 In: 2629.8 [I.V.:2278.5; NG/GT:351.3] Out: -  Intake/Output this shift: No intake/output data recorded.  Exam: Awake and alert in NAD cv tachy, but regular Abdomen soft, much less distended. Drain sites clear.   J tube in place.  Tube feeds on hold.    Lab Results:  Recent Labs    10/12/19 0526  WBC 8.6  HGB 10.2*  HCT 32.4*  PLT 359   BMET Recent Labs    10/12/19 0526  NA 139  K 3.9  CL 108  CO2 24  GLUCOSE 138*  BUN 15  CREATININE 0.50  CALCIUM 8.2*   PT/INR No results for input(s): LABPROT, INR in the last 72 hours. ABG No results for input(s): PHART, HCO3 in the last 72 hours.  Invalid input(s): PCO2, PO2  Studies/Results: No results found.  Anti-infectives: Anti-infectives (From admission, onward)   Start     Dose/Rate Route Frequency Ordered Stop   10/02/19 1858  vancomycin (VANCOCIN) IVPB 1000 mg/200 mL premix     1,000 mg 200 mL/hr over 60 Minutes Intravenous Every 12 hours 10/02/19 1533 10/05/19 1944   09/29/19 0630  vancomycin (VANCOCIN) IVPB 1000 mg/200 mL premix  Status:  Discontinued     1,000 mg 200 mL/hr over 60 Minutes Intravenous Every 24 hours 09/29/19 0625 10/02/19 1533   09/29/19 0615  ceFEPIme (MAXIPIME) 2 g in sodium chloride 0.9 % 100 mL IVPB     2 g 200 mL/hr over 30 Minutes Intravenous Every 12 hours 09/29/19 0605 10/05/19 2300   09/26/2019 2200  ceFAZolin (ANCEF) IVPB 2g/100 mL premix     2 g 200 mL/hr over 30 Minutes Intravenous Every 8 hours 10/18/2019 1523 09/28/19 1900   10/09/2019 0600   ceFAZolin (ANCEF) IVPB 2g/100 mL premix     2 g 200 mL/hr over 30 Minutes Intravenous On call to O.R. 10/23/2019 0550 10/22/2019 1338   10/21/2019 0558  ceFAZolin (ANCEF) 2-4 GM/100ML-% IVPB    Note to Pharmacy: Marga Melnick   : cabinet override      10/22/2019 0558 10/01/2019 1814      Assessment/Plan: s/p Procedure(s) with comments: WHIPPLE PROCEDURE (N/A) - GENERAL AND EPIDURAL GASTROJEJUNOSTOMY, J TUBE (N/A) - GENERAL AND EPIDURAL Laparoscopy Diagnostic (N/A)  ABL anemia - stable.   Pancreatic cancer  Restart tube feeds Likely has delayed gastric emptying.  If this does not resolve, may need venting G tube.       LOS: 17 days    Stark Klein 10/14/2019

## 2019-10-15 LAB — COMPREHENSIVE METABOLIC PANEL
ALT: 13 U/L (ref 0–44)
AST: 13 U/L — ABNORMAL LOW (ref 15–41)
Albumin: 1.6 g/dL — ABNORMAL LOW (ref 3.5–5.0)
Alkaline Phosphatase: 129 U/L — ABNORMAL HIGH (ref 38–126)
Anion gap: 9 (ref 5–15)
BUN: <5 mg/dL — ABNORMAL LOW (ref 8–23)
CO2: 22 mmol/L (ref 22–32)
Calcium: 7.9 mg/dL — ABNORMAL LOW (ref 8.9–10.3)
Chloride: 109 mmol/L (ref 98–111)
Creatinine, Ser: 0.37 mg/dL — ABNORMAL LOW (ref 0.44–1.00)
GFR calc Af Amer: >60 mL/min (ref >60)
GFR calc non Af Amer: >60 mL/min (ref >60)
Glucose, Bld: 93 mg/dL (ref 70–99)
Potassium: 3.1 mmol/L — ABNORMAL LOW (ref 3.5–5.1)
Sodium: 140 mmol/L (ref 135–145)
Total Bilirubin: <0.1 mg/dL — ABNORMAL LOW (ref 0.3–1.2)
Total Protein: 4.5 g/dL — ABNORMAL LOW (ref 6.5–8.1)

## 2019-10-15 LAB — CBC
HCT: 35.2 % — ABNORMAL LOW (ref 36.0–46.0)
Hemoglobin: 11.2 g/dL — ABNORMAL LOW (ref 12.0–15.0)
MCH: 31 pg (ref 26.0–34.0)
MCHC: 31.8 g/dL (ref 30.0–36.0)
MCV: 97.5 fL (ref 80.0–100.0)
Platelets: 320 10*3/uL (ref 150–400)
RBC: 3.61 MIL/uL — ABNORMAL LOW (ref 3.87–5.11)
RDW: 14.8 % (ref 11.5–15.5)
WBC: 6.9 10*3/uL (ref 4.0–10.5)
nRBC: 0 % (ref 0.0–0.2)

## 2019-10-15 LAB — GLUCOSE, CAPILLARY
Glucose-Capillary: 100 mg/dL — ABNORMAL HIGH (ref 70–99)
Glucose-Capillary: 131 mg/dL — ABNORMAL HIGH (ref 70–99)
Glucose-Capillary: 133 mg/dL — ABNORMAL HIGH (ref 70–99)
Glucose-Capillary: 146 mg/dL — ABNORMAL HIGH (ref 70–99)
Glucose-Capillary: 80 mg/dL (ref 70–99)
Glucose-Capillary: 85 mg/dL (ref 70–99)
Glucose-Capillary: 93 mg/dL (ref 70–99)

## 2019-10-15 LAB — PREALBUMIN: Prealbumin: 10.4 mg/dL — ABNORMAL LOW (ref 18–38)

## 2019-10-15 MED ORDER — POTASSIUM CHLORIDE 10 MEQ/100ML IV SOLN
10.0000 meq | INTRAVENOUS | Status: AC
  Administered 2019-10-15 (×6): 10 meq via INTRAVENOUS
  Filled 2019-10-15 (×6): qty 100

## 2019-10-15 NOTE — Progress Notes (Signed)
Patient abdomen distended, hard and tender to touch.Complained of abdominal pain , asked for  Bedpan, and the content was pure tube feed that passed out. Pt appears not to be absorbing the feed well. Tube feed turned off.

## 2019-10-15 NOTE — Progress Notes (Signed)
Subjective:    Overnight Issues: TF turned off due to BM with TF consistency per nursing.   Objective:  Vital signs for last 24 hours: Temp:  [97.5 F (36.4 C)-98.1 F (36.7 C)] 98.1 F (36.7 C) (11/21 1147) Pulse Rate:  [92-103] 101 (11/21 1200) Resp:  [15-21] 21 (11/21 1200) BP: (131-151)/(58-96) 143/71 (11/21 1200) SpO2:  [94 %-100 %] 100 % (11/21 1200) Weight:  [74.3 kg] 74.3 kg (11/21 0418)  Hemodynamic parameters for last 24 hours:    Intake/Output from previous day: 11/20 0701 - 11/21 0700 In: 2209.5 [I.V.:2095.5; NG/GT:114] Out: 900 [Emesis/NG output:900]  Intake/Output this shift: Total I/O In: 687.9 [P.O.:60; I.V.:597.9; Other:30] Out: 900 [Emesis/NG output:900]  Vent settings for last 24 hours:    Physical Exam:  Gen: comfortable, no distress Neuro: non-focal exam HEENT: NGT in place, 900cc bilious o/p Neck: supple CV: RRR Pulm: unlabored breathing Abd: soft, NT, distended Extr: wwp, no edema   Results for orders placed or performed during the hospital encounter of 10/24/2019 (from the past 24 hour(s))  Glucose, capillary     Status: Abnormal   Collection Time: 10/14/19  3:49 PM  Result Value Ref Range   Glucose-Capillary 120 (H) 70 - 99 mg/dL  Glucose, capillary     Status: None   Collection Time: 10/14/19  8:02 PM  Result Value Ref Range   Glucose-Capillary 97 70 - 99 mg/dL   Comment 1 Notify RN    Comment 2 Document in Chart   Glucose, capillary     Status: None   Collection Time: 10/15/19 12:19 AM  Result Value Ref Range   Glucose-Capillary 93 70 - 99 mg/dL   Comment 1 Notify RN    Comment 2 Document in Chart   Glucose, capillary     Status: Abnormal   Collection Time: 10/15/19  3:43 AM  Result Value Ref Range   Glucose-Capillary 100 (H) 70 - 99 mg/dL   Comment 1 Notify RN    Comment 2 Document in Chart   CBC     Status: Abnormal   Collection Time: 10/15/19  5:40 AM  Result Value Ref Range   WBC 6.9 4.0 - 10.5 K/uL   RBC 3.61  (L) 3.87 - 5.11 MIL/uL   Hemoglobin 11.2 (L) 12.0 - 15.0 g/dL   HCT 35.2 (L) 36.0 - 46.0 %   MCV 97.5 80.0 - 100.0 fL   MCH 31.0 26.0 - 34.0 pg   MCHC 31.8 30.0 - 36.0 g/dL   RDW 14.8 11.5 - 15.5 %   Platelets 320 150 - 400 K/uL   nRBC 0.0 0.0 - 0.2 %  Comprehensive metabolic panel     Status: Abnormal   Collection Time: 10/15/19  5:40 AM  Result Value Ref Range   Sodium 140 135 - 145 mmol/L   Potassium 3.1 (L) 3.5 - 5.1 mmol/L   Chloride 109 98 - 111 mmol/L   CO2 22 22 - 32 mmol/L   Glucose, Bld 93 70 - 99 mg/dL   BUN <5 (L) 8 - 23 mg/dL   Creatinine, Ser 0.37 (L) 0.44 - 1.00 mg/dL   Calcium 7.9 (L) 8.9 - 10.3 mg/dL   Total Protein 4.5 (L) 6.5 - 8.1 g/dL   Albumin 1.6 (L) 3.5 - 5.0 g/dL   AST 13 (L) 15 - 41 U/L   ALT 13 0 - 44 U/L   Alkaline Phosphatase 129 (H) 38 - 126 U/L   Total Bilirubin <0.1 (L) 0.3 -  1.2 mg/dL   GFR calc non Af Amer >60 >60 mL/min   GFR calc Af Amer >60 >60 mL/min   Anion gap 9 5 - 15  Prealbumin     Status: Abnormal   Collection Time: 10/15/19  5:40 AM  Result Value Ref Range   Prealbumin 10.4 (L) 18 - 38 mg/dL  Glucose, capillary     Status: None   Collection Time: 10/15/19  8:29 AM  Result Value Ref Range   Glucose-Capillary 80 70 - 99 mg/dL  Glucose, capillary     Status: None   Collection Time: 10/15/19 11:47 AM  Result Value Ref Range   Glucose-Capillary 85 70 - 99 mg/dL    Assessment & Plan: Present on Admission: . Pancreatic cancer ([place]) . 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    27F s/p Whipple  Pancreatic cancer - s/p Whipple by Dr. Barry Dienes 11/3 ABL anemia - stable Presumed delayed gastric emptying - continue NGT to suction. May need venting G-tube. FEN - restart TF via feeding J-tube. Consistency of BM is not an indication to hold TF. Replete K, check mag and phos. DVT - LMWH, PT/OT   Jesusita Oka, MD Trauma & General  Surgery Please use [email or web address] to contact on call provider  10/15/2019  *Care during the described time interval was provided by me. I have reviewed this patient's available data, including medical history, events of note, physical examination and test results as part of my evaluation.

## 2019-10-16 LAB — COMPREHENSIVE METABOLIC PANEL
ALT: 15 U/L (ref 0–44)
AST: 16 U/L (ref 15–41)
Albumin: 1.6 g/dL — ABNORMAL LOW (ref 3.5–5.0)
Alkaline Phosphatase: 140 U/L — ABNORMAL HIGH (ref 38–126)
Anion gap: 7 (ref 5–15)
BUN: <5 mg/dL — ABNORMAL LOW (ref 8–23)
CO2: 23 mmol/L (ref 22–32)
Calcium: 7.9 mg/dL — ABNORMAL LOW (ref 8.9–10.3)
Chloride: 108 mmol/L (ref 98–111)
Creatinine, Ser: 0.51 mg/dL (ref 0.44–1.00)
GFR calc Af Amer: >60 mL/min (ref >60)
GFR calc non Af Amer: >60 mL/min (ref >60)
Glucose, Bld: 97 mg/dL (ref 70–99)
Potassium: 3.6 mmol/L (ref 3.5–5.1)
Sodium: 138 mmol/L (ref 135–145)
Total Bilirubin: 0.4 mg/dL (ref 0.3–1.2)
Total Protein: 4.7 g/dL — ABNORMAL LOW (ref 6.5–8.1)

## 2019-10-16 LAB — GLUCOSE, CAPILLARY
Glucose-Capillary: 115 mg/dL — ABNORMAL HIGH (ref 70–99)
Glucose-Capillary: 129 mg/dL — ABNORMAL HIGH (ref 70–99)
Glucose-Capillary: 131 mg/dL — ABNORMAL HIGH (ref 70–99)
Glucose-Capillary: 135 mg/dL — ABNORMAL HIGH (ref 70–99)
Glucose-Capillary: 138 mg/dL — ABNORMAL HIGH (ref 70–99)
Glucose-Capillary: 82 mg/dL (ref 70–99)

## 2019-10-16 LAB — CBC
HCT: 34.5 % — ABNORMAL LOW (ref 36.0–46.0)
Hemoglobin: 11.1 g/dL — ABNORMAL LOW (ref 12.0–15.0)
MCH: 31.6 pg (ref 26.0–34.0)
MCHC: 32.2 g/dL (ref 30.0–36.0)
MCV: 98.3 fL (ref 80.0–100.0)
Platelets: 347 10*3/uL (ref 150–400)
RBC: 3.51 MIL/uL — ABNORMAL LOW (ref 3.87–5.11)
RDW: 14.8 % (ref 11.5–15.5)
WBC: 8.9 10*3/uL (ref 4.0–10.5)
nRBC: 0 % (ref 0.0–0.2)

## 2019-10-16 LAB — MAGNESIUM: Magnesium: 1.7 mg/dL (ref 1.7–2.4)

## 2019-10-16 LAB — PHOSPHORUS: Phosphorus: 2.4 mg/dL — ABNORMAL LOW (ref 2.5–4.6)

## 2019-10-16 MED ORDER — GERHARDT'S BUTT CREAM
TOPICAL_CREAM | Freq: Three times a day (TID) | CUTANEOUS | Status: DC | PRN
  Filled 2019-10-16: qty 1

## 2019-10-16 MED ORDER — MAGNESIUM SULFATE 2 GM/50ML IV SOLN
2.0000 g | Freq: Once | INTRAVENOUS | Status: AC
  Administered 2019-10-16: 2 g via INTRAVENOUS
  Filled 2019-10-16: qty 50

## 2019-10-16 MED ORDER — POTASSIUM PHOSPHATES 15 MMOLE/5ML IV SOLN
30.0000 mmol | Freq: Once | INTRAVENOUS | Status: AC
  Administered 2019-10-16: 30 mmol via INTRAVENOUS
  Filled 2019-10-16: qty 10

## 2019-10-16 MED ORDER — LOPERAMIDE HCL 2 MG PO CAPS
2.0000 mg | ORAL_CAPSULE | Freq: Three times a day (TID) | ORAL | Status: DC
  Administered 2019-10-16 – 2019-10-18 (×7): 2 mg via ORAL
  Filled 2019-10-16 (×7): qty 1

## 2019-10-16 NOTE — Progress Notes (Signed)
Subjective:    Overnight Issues: TF restarted, frequent BMs  Objective:  Vital signs for last 24 hours: Temp:  [97.7 F (36.5 C)-98.1 F (36.7 C)] 97.8 F (36.6 C) (11/22 0803) Pulse Rate:  [93-108] 107 (11/22 0803) Resp:  [15-26] 20 (11/22 0803) BP: (135-151)/(65-75) 135/71 (11/22 0803) SpO2:  [93 %-100 %] 97 % (11/22 0803) Weight:  [74.5 kg] 74.5 kg (11/22 0429)  Hemodynamic parameters for last 24 hours:    Intake/Output from previous day: 11/21 0701 - 11/22 0700 In: 3503.4 [P.O.:60; I.V.:2356.4; NG/GT:737; IV Piggyback:200] Out: 1950 [Emesis/NG [id number]  Intake/Output this shift: No intake/output data recorded.  Vent settings for last 24 hours:    Physical Exam:  Gen: comfortable, no distress Neuro: non-focal exam HEENT: NGT in place, 900cc bilious o/p Neck: supple CV: RRR Pulm: unlabored breathing Abd: soft, NT, distended Extr: wwp, no edema   Results for orders placed or performed during the hospital encounter of 10/16/2019 (from the past 24 hour(s))  Glucose, capillary     Status: None   Collection Time: 10/15/19 11:47 AM  Result Value Ref Range   Glucose-Capillary 85 70 - 99 mg/dL  Glucose, capillary     Status: Abnormal   Collection Time: 10/15/19  4:59 PM  Result Value Ref Range   Glucose-Capillary 146 (H) 70 - 99 mg/dL  Glucose, capillary     Status: Abnormal   Collection Time: 10/15/19  9:09 PM  Result Value Ref Range   Glucose-Capillary 131 (H) 70 - 99 mg/dL   Comment 1 Notify RN    Comment 2 Document in Chart   Glucose, capillary     Status: Abnormal   Collection Time: 10/15/19 11:27 PM  Result Value Ref Range   Glucose-Capillary 133 (H) 70 - 99 mg/dL  Glucose, capillary     Status: Abnormal   Collection Time: 10/16/19  3:39 AM  Result Value Ref Range   Glucose-Capillary 131 (H) 70 - 99 mg/dL   Comment 1 Notify RN    Comment 2 Document in Chart   Magnesium     Status: None   Collection Time: 10/16/19  6:44 AM  Result Value Ref  Range   Magnesium 1.7 1.7 - 2.4 mg/dL  Phosphorus     Status: Abnormal   Collection Time: 10/16/19  6:44 AM  Result Value Ref Range   Phosphorus 2.4 (L) 2.5 - 4.6 mg/dL  CBC     Status: Abnormal   Collection Time: 10/16/19  6:44 AM  Result Value Ref Range   WBC 8.9 4.0 - 10.5 K/uL   RBC 3.51 (L) 3.87 - 5.11 MIL/uL   Hemoglobin 11.1 (L) 12.0 - 15.0 g/dL   HCT 34.5 (L) 36.0 - 46.0 %   MCV 98.3 80.0 - 100.0 fL   MCH 31.6 26.0 - 34.0 pg   MCHC 32.2 30.0 - 36.0 g/dL   RDW 14.8 11.5 - 15.5 %   Platelets 347 150 - 400 K/uL   nRBC 0.0 0.0 - 0.2 %  CMP     Status: Abnormal   Collection Time: 10/16/19  6:44 AM  Result Value Ref Range   Sodium 138 135 - 145 mmol/L   Potassium 3.6 3.5 - 5.1 mmol/L   Chloride 108 98 - 111 mmol/L   CO2 23 22 - 32 mmol/L   Glucose, Bld 97 70 - 99 mg/dL   BUN <5 (L) 8 - 23 mg/dL   Creatinine, Ser 0.51 0.44 - 1.00 mg/dL   Calcium  7.9 (L) 8.9 - 10.3 mg/dL   Total Protein 4.7 (L) 6.5 - 8.1 g/dL   Albumin 1.6 (L) 3.5 - 5.0 g/dL   AST 16 15 - 41 U/L   ALT 15 0 - 44 U/L   Alkaline Phosphatase 140 (H) 38 - 126 U/L   Total Bilirubin 0.4 0.3 - 1.2 mg/dL   GFR calc non Af Amer >60 >60 mL/min   GFR calc Af Amer >60 >60 mL/min   Anion gap 7 5 - 15  Glucose, capillary     Status: None   Collection Time: 10/16/19  7:28 AM  Result Value Ref Range   Glucose-Capillary 82 70 - 99 mg/dL   Comment 1 Notify RN    Comment 2 Document in Chart     Assessment & Plan: Present on Admission: . Pancreatic cancer ([place]) . 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    66F s/p Whipple  Pancreatic cancer - s/p Whipple by Dr. Barry Dienes 11/3 ABL anemia - stable Presumed delayed gastric emptying - continue NGT to suction. May need venting G-tube. FEN - continue TF via feeding J-tube. Consistency of BM is not an indication to hold TF. Replete mag and phos. Start loperamide to slow GI  transit. DVT - LMWH, PT/OT   Jesusita Oka, MD Trauma & General Surgery Please use [email or web address] to contact on call provider  10/16/2019  *Care during the described time interval was provided by me. I have reviewed this patient's available data, including medical history, events of note, physical examination and test results as part of my evaluation.

## 2019-10-16 NOTE — Progress Notes (Signed)
Occupational Therapy Treatment Patient Details Name: Julie Rollins MRN: [id number] DOB: [birth year] Today's Date: 10/16/2019    History of present illness 73 y.o. female with medical history significant of pancreatic cancer, breast cancer status post lumpectomy and radiation, hypertension, hypothyroidism admitted s/p Whipple procedure.   OT comments  Pt making steady progress towards OT goals this session. Session focus on functional mobility as precursor to higher level ADLs and seated UB ADLs. Pt initially required MOD A +2 sit>stand from EOB but progressed to min guard +1. Pt completed seated grooming with set-up assist. Pt able to complete simulated toilet transfer via functional mobility with min guard assist with RW. DC Plan remains appropriate, will continue to follow acutely per POC.    Follow Up Recommendations  SNF;Supervision/Assistance - 24 hour    Equipment Recommendations  3 in 1 bedside commode;Hospital bed    Recommendations for Other Services      Precautions / Restrictions Precautions Precautions: Fall;Other (comment) Precaution Comments: NG tube Restrictions Weight Bearing Restrictions: No       Mobility Bed Mobility Overal bed mobility: Needs Assistance Bed Mobility: Supine to Sit     Supine to sit: Min assist;HOB elevated     General bed mobility comments: Min A to scoot bottom to EOB.  Transfers Overall transfer level: Needs assistance Equipment used: Rolling walker (2 wheeled) Transfers: Sit to/from Stand Sit to Stand: Mod assist;+2 physical assistance;Min guard         General transfer comment: Initially Mod A of 2 to stand from EOB x2 progressing to Min guard to stand from chair x3 with cues for hand placement and use of momentum.    Balance Overall balance assessment: Needs assistance Sitting-balance support: Feet supported;No upper extremity supported Sitting balance-Leahy Scale: Fair Sitting balance - Comments: able to maintain  upright sitting balance for short period of time   Standing balance support: During functional activity Standing balance-Leahy Scale: Poor Standing balance comment: Requires BUE support for standing                           ADL either performed or assessed with clinical judgement   ADL Overall ADL's : Needs assistance/impaired     Grooming: Set up;Sitting;Wash/dry hands Grooming Details (indicate cue type and reason): Pt able to wash face at EOB with set- up assist                 Toilet Transfer: Min guard;Ambulation;RW;Moderate assistance;+2 for physical assistance;+2 for safety/equipment Toilet Transfer Details (indicate cue type and reason): simualted via functional mobility; pt initially required MOD A +2 for sit>stand but progressed to min guard +2. close min guard for safety during ambulation as pt tends to sit without warning         Functional mobility during ADLs: Min guard;Rolling walker;+2 for safety/equipment General ADL Comments: pt particular about things but overall participatory in session. Pt min guard +2 for safety for functional mobility and completes seated UB ADL with supervision/ set- up     Vision Baseline Vision/History: Wears glasses Wears Glasses: At all times Patient Visual Report: No change from baseline Vision Assessment?: No apparent visual deficits   Perception     Praxis      Cognition Arousal/Alertness: Awake/alert Behavior During Therapy: WFL for tasks assessed/performed;Anxious Overall Cognitive Status: Within Functional Limits for tasks assessed  General Comments: for basic mobility tasks. very particular. can quickly become anxious if she starts to feet overwhelmed        Exercises     Shoulder Instructions       General Comments HR max 141 bpm with functional mobility , SpO2 > 91% on RA    Pertinent Vitals/ Pain       Pain Assessment: No/denies pain  Home  Living                                          Prior Functioning/Environment              Frequency  Min 2X/week        Progress Toward Goals  OT Goals(current goals can now be found in the care plan section)  Progress towards OT goals: Progressing toward goals  Acute Rehab OT Goals Patient Stated Goal: go home OT Goal Formulation: With patient Time For Goal Achievement: 10/29/19 Potential to Achieve Goals: Good  Plan Discharge plan remains appropriate    Co-evaluation    PT/OT/SLP Co-Evaluation/Treatment: Yes Reason for Co-Treatment: For patient/therapist safety;To address functional/ADL transfers PT goals addressed during session: Mobility/safety with mobility        AM-PAC OT "6 Clicks" Daily Activity     Outcome Measure   Help from another person eating meals?: None Help from another person taking care of personal grooming?: A Little Help from another person toileting, which includes using toliet, bedpan, or urinal?: A Little Help from another person bathing (including washing, rinsing, drying)?: A Little Help from another person to put on and taking off regular upper body clothing?: A Little Help from another person to put on and taking off regular lower body clothing?: A Little 6 Click Score: 19    End of Session Equipment Utilized During Treatment: Rolling walker;Gait belt  OT Visit Diagnosis: Unsteadiness on feet (R26.81);Muscle weakness (generalized) (M62.81);Pain   Activity Tolerance Patient tolerated treatment well   Patient Left in chair;with call bell/phone within reach;with chair alarm set   Nurse Communication Mobility status;Other (comment)(needs to be hooked back up to suction for NG tube)        Time: [id number] OT Time Calculation (min): 37 min  Charges: OT General Charges $OT Visit: 1 Visit OT Treatments $Therapeutic Activity: 8-22 mins  Lanier Clam., COTA/L Acute Rehabilitation  Services [phone] [place] 10/16/2019, 12:18 PM

## 2019-10-16 NOTE — Progress Notes (Signed)
Physical Therapy Treatment Patient Details Name: Julie Rollins MRN: [id number] DOB: [birth year] Today's Date: 10/16/2019    History of Present Illness 73 y.o. female with medical history significant of pancreatic cancer, breast cancer status post lumpectomy and radiation, hypertension, hypothyroidism admitted s/p Whipple procedure.    PT Comments    Patient progressing slowly towards PT goals. Tolerated gait training with close Min guard assist for balance/safety with chair follow as pt fatigues quickly requiring multiple seated rest breaks with little warning.  Initially requires Mod A of 2 to stand progressing to min guard. Reports using bed pan with nursing and not getting up. Encouraged pt to walk with nursing daily or minimally use BSC instead of bed pan. HR max 140 bpm. Will continue to follow.    Follow Up Recommendations  SNF     Equipment Recommendations  None recommended by PT    Recommendations for Other Services       Precautions / Restrictions Precautions Precautions: Fall;Other (comment) Precaution Comments: NG tube Restrictions Weight Bearing Restrictions: No    Mobility  Bed Mobility Overal bed mobility: Needs Assistance Bed Mobility: Supine to Sit     Supine to sit: Min assist;HOB elevated     General bed mobility comments: Min A to scoot bottom to EOB.  Transfers Overall transfer level: Needs assistance Equipment used: Rolling walker (2 wheeled) Transfers: Sit to/from Stand Sit to Stand: Mod assist;+2 physical assistance;Min guard         General transfer comment: Initially Mod A of 2 to stand from EOB x2 progressing to Min guard to stand from chair x3 with cues for hand placement and use of momentum.  Ambulation/Gait Ambulation/Gait assistance: Min guard;+2 safety/equipment Gait Distance (Feet): 25 Feet(+25' +40') Assistive device: Rolling walker (2 wheeled) Gait Pattern/deviations: Decreased stride length;Step-through pattern;Trunk  flexed Gait velocity: decreased Gait velocity interpretation: <1.8 ft/sec, indicate of risk for recurrent falls General Gait Details: Slow, mildly unsteady gait with cues for RW proximity; 2 seated rest breaks. HR up to 140 bpm. Sits with little warning.   Stairs             Wheelchair Mobility    Modified Rankin (Stroke Patients Only)       Balance Overall balance assessment: Needs assistance Sitting-balance support: Feet supported;No upper extremity supported Sitting balance-Leahy Scale: Fair     Standing balance support: During functional activity Standing balance-Leahy Scale: Poor Standing balance comment: Requires BUE support for standing                            Cognition Arousal/Alertness: Awake/alert Behavior During Therapy: WFL for tasks assessed/performed Overall Cognitive Status: Within Functional Limits for tasks assessed                                 General Comments: for basic mobility tasks. very particular.      Exercises      General Comments General comments (skin integrity, edema, etc.): Max HR 140 bpm. Sp02 remained >91% on RA.      Pertinent Vitals/Pain Pain Assessment: No/denies pain    Home Living                      Prior Function            PT Goals (current goals can now be found in the care plan section) Progress towards  PT goals: Progressing toward goals    Frequency    Min 3X/week      PT Plan Current plan remains appropriate    Co-evaluation PT/OT/SLP Co-Evaluation/Treatment: Yes Reason for Co-Treatment: For patient/therapist safety;To address functional/ADL transfers PT goals addressed during session: Mobility/safety with mobility        AM-PAC PT "6 Clicks" Mobility   Outcome Measure  Help needed turning from your back to your side while in a flat bed without using bedrails?: A Little Help needed moving from lying on your back to sitting on the side of a flat bed  without using bedrails?: A Little Help needed moving to and from a bed to a chair (including a wheelchair)?: A Little Help needed standing up from a chair using your arms (e.g., wheelchair or bedside chair)?: A Little Help needed to walk in hospital room?: A Little Help needed climbing 3-5 steps with a railing? : A Lot 6 Click Score: 17    End of Session Equipment Utilized During Treatment: Gait belt Activity Tolerance: Patient tolerated treatment well Patient left: in chair;with call bell/phone within reach;with chair alarm set Nurse Communication: Mobility status;Other (comment)(NG tube to be reconnected to suction) PT Visit Diagnosis: Difficulty in walking, not elsewhere classified (R26.2);Muscle weakness (generalized) (M62.81)     Time: [id number] PT Time Calculation (min) (ACUTE ONLY): 37 min  Charges:  $Gait Training: 8-22 mins                     Marisa Severin, PT, DPT Acute Rehabilitation Services Pager [phone] Office [place] 10/16/2019, 10:52 AM

## 2019-10-17 ENCOUNTER — Inpatient Hospital Stay (HOSPITAL_COMMUNITY): Payer: Medicare HMO

## 2019-10-17 LAB — GLUCOSE, CAPILLARY
Glucose-Capillary: 139 mg/dL — ABNORMAL HIGH (ref 70–99)
Glucose-Capillary: 137 mg/dL — ABNORMAL HIGH (ref 70–99)
Glucose-Capillary: 86 mg/dL (ref 70–99)
Glucose-Capillary: 102 mg/dL — ABNORMAL HIGH (ref 70–99)
Glucose-Capillary: 103 mg/dL — ABNORMAL HIGH (ref 70–99)
Glucose-Capillary: 89 mg/dL (ref 70–99)
Glucose-Capillary: 124 mg/dL — ABNORMAL HIGH (ref 70–99)
Glucose-Capillary: 106 mg/dL — ABNORMAL HIGH (ref 70–99)

## 2019-10-17 LAB — CBC
HCT: 34.1 % — ABNORMAL LOW (ref 36.0–46.0)
Hemoglobin: 10.8 g/dL — ABNORMAL LOW (ref 12.0–15.0)
MCH: 31.4 pg (ref 26.0–34.0)
MCHC: 31.7 g/dL (ref 30.0–36.0)
MCV: 99.1 fL (ref 80.0–100.0)
Platelets: 339 10*3/uL (ref 150–400)
RBC: 3.44 MIL/uL — ABNORMAL LOW (ref 3.87–5.11)
RDW: 14.9 % (ref 11.5–15.5)
WBC: 7.7 10*3/uL (ref 4.0–10.5)
nRBC: 0 % (ref 0.0–0.2)

## 2019-10-17 LAB — COMPREHENSIVE METABOLIC PANEL
ALT: 12 U/L (ref 0–44)
AST: 12 U/L — ABNORMAL LOW (ref 15–41)
Albumin: 1.6 g/dL — ABNORMAL LOW (ref 3.5–5.0)
Alkaline Phosphatase: 120 U/L (ref 38–126)
Anion gap: 6 (ref 5–15)
BUN: 5 mg/dL — ABNORMAL LOW (ref 8–23)
CO2: 23 mmol/L (ref 22–32)
Calcium: 7.8 mg/dL — ABNORMAL LOW (ref 8.9–10.3)
Chloride: 108 mmol/L (ref 98–111)
Creatinine, Ser: 0.54 mg/dL (ref 0.44–1.00)
GFR calc Af Amer: >60 mL/min (ref >60)
GFR calc non Af Amer: >60 mL/min (ref >60)
Glucose, Bld: 100 mg/dL — ABNORMAL HIGH (ref 70–99)
Potassium: 3.9 mmol/L (ref 3.5–5.1)
Sodium: 137 mmol/L (ref 135–145)
Total Bilirubin: 0.2 mg/dL — ABNORMAL LOW (ref 0.3–1.2)
Total Protein: 4.4 g/dL — ABNORMAL LOW (ref 6.5–8.1)

## 2019-10-17 LAB — PHOSPHORUS: Phosphorus: 3.3 mg/dL (ref 2.5–4.6)

## 2019-10-17 LAB — MAGNESIUM: Magnesium: 1.9 mg/dL (ref 1.7–2.4)

## 2019-10-17 MED ORDER — IOHEXOL 300 MG/ML  SOLN
100.0000 mL | Freq: Once | INTRAMUSCULAR | Status: AC | PRN
  Administered 2019-10-17: 100 mL via INTRAVENOUS

## 2019-10-17 NOTE — Progress Notes (Signed)
OT Cancellation Note  Patient Details Name: Julie Rollins MRN: [id number] DOB: May 10, 1946   Cancelled Treatment:    Reason Eval/Treat Not Completed: Pain limiting ability to participate;Other (comment) Upon arrival,  pt reports increased pain stating, "I don't want to do anything but lay in the bed." Will check back as time allows for OT session.  Lanier Clam., COTA/L Acute Rehabilitation Services [phone] [place] 10/17/2019, 3:26 PM

## 2019-10-17 NOTE — Progress Notes (Signed)
Subjective:     Had lots of BMs over the weekend.    Objective:  Vital signs for last 24 hours: Temp:  [97.5 F (36.4 C)-98.6 F (37 C)] 97.5 F (36.4 C) (11/23 0716) Pulse Rate:  [103-113] 103 (11/23 0716) Resp:  [17-20] 17 (11/23 0716) BP: (127-152)/(56-77) 136/62 (11/23 0716) SpO2:  [97 %-98 %] 97 % (11/23 0716) Weight:  [74.5 kg] 74.5 kg (11/23 0500)  Intake/Output from previous day: 11/22 0701 - 11/23 0700 In: 1273.9 [P.O.:50; I.V.:549.8; NG/GT:330; IV Piggyback:344.1] Out: 1250 [Urine:100; Emesis/NG output:1150]  Intake/Output this shift: No intake/output data recorded.  Vent settings for last 24 hours:    Physical Exam:  Gen: comfortable, complaining. Neuro: non-focal exam HEENT: NGT in place, bilious output Neck: supple CV: RRR Pulm: unlabored breathing Abd: soft, NT, distended Extr: wwp, no edema   Results for orders placed or performed during the hospital encounter of 10/01/2019 (from the past 24 hour(s))  Glucose, capillary     Status: Abnormal   Collection Time: 10/16/19 12:33 PM  Result Value Ref Range   Glucose-Capillary 135 (H) 70 - 99 mg/dL  Glucose, capillary     Status: Abnormal   Collection Time: 10/16/19  4:32 PM  Result Value Ref Range   Glucose-Capillary 138 (H) 70 - 99 mg/dL  Glucose, capillary     Status: Abnormal   Collection Time: 10/16/19  8:12 PM  Result Value Ref Range   Glucose-Capillary 129 (H) 70 - 99 mg/dL  Glucose, capillary     Status: Abnormal   Collection Time: 10/16/19 11:49 PM  Result Value Ref Range   Glucose-Capillary 115 (H) 70 - 99 mg/dL  Glucose, capillary     Status: Abnormal   Collection Time: 10/17/19  3:49 AM  Result Value Ref Range   Glucose-Capillary 139 (H) 70 - 99 mg/dL  Glucose, capillary     Status: Abnormal   Collection Time: 10/17/19  4:32 AM  Result Value Ref Range   Glucose-Capillary 137 (H) 70 - 99 mg/dL  Magnesium     Status: None   Collection Time: 10/17/19  6:22 AM  Result Value Ref  Range   Magnesium 1.9 1.7 - 2.4 mg/dL  Phosphorus     Status: None   Collection Time: 10/17/19  6:22 AM  Result Value Ref Range   Phosphorus 3.3 2.5 - 4.6 mg/dL  CBC     Status: Abnormal   Collection Time: 10/17/19  6:22 AM  Result Value Ref Range   WBC 7.7 4.0 - 10.5 K/uL   RBC 3.44 (L) 3.87 - 5.11 MIL/uL   Hemoglobin 10.8 (L) 12.0 - 15.0 g/dL   HCT 34.1 (L) 36.0 - 46.0 %   MCV 99.1 80.0 - 100.0 fL   MCH 31.4 26.0 - 34.0 pg   MCHC 31.7 30.0 - 36.0 g/dL   RDW 14.9 11.5 - 15.5 %   Platelets 339 150 - 400 K/uL   nRBC 0.0 0.0 - 0.2 %  CMP     Status: Abnormal   Collection Time: 10/17/19  6:22 AM  Result Value Ref Range   Sodium 137 135 - 145 mmol/L   Potassium 3.9 3.5 - 5.1 mmol/L   Chloride 108 98 - 111 mmol/L   CO2 23 22 - 32 mmol/L   Glucose, Bld 100 (H) 70 - 99 mg/dL   BUN 5 (L) 8 - 23 mg/dL   Creatinine, Ser 0.54 0.44 - 1.00 mg/dL   Calcium 7.8 (L) 8.9 -  10.3 mg/dL   Total Protein 4.4 (L) 6.5 - 8.1 g/dL   Albumin 1.6 (L) 3.5 - 5.0 g/dL   AST 12 (L) 15 - 41 U/L   ALT 12 0 - 44 U/L   Alkaline Phosphatase 120 38 - 126 U/L   Total Bilirubin 0.2 (L) 0.3 - 1.2 mg/dL   GFR calc non Af Amer >60 >60 mL/min   GFR calc Af Amer >60 >60 mL/min   Anion gap 6 5 - 15  Glucose, capillary     Status: None   Collection Time: 10/17/19  7:18 AM  Result Value Ref Range   Glucose-Capillary 86 70 - 99 mg/dL    Assessment & Plan: Present on Admission: . Pancreatic cancer ([place]) . Adenocarcinoma of head of pancreas ([place])    LOS: 20 days   Additional comments:I reviewed the patient's new clinical lab test results.    67F s/p Whipple  Pancreatic cancer - s/p Whipple by Dr. Barry Dienes 11/3 ABL anemia - stable Presumed delayed gastric emptying - continue NGT to suction. May need venting G-tube. Will get CT today to assess gastric location and colon location.  Hold on reglan as she is having significant diarrhea.   FEN - continue TF via feeding J-tube. Consistency of BM is not an indication  to hold TF. Replete mag and phos. Loperamide to slow GI transit.  DVT - LMWH, PT/OT   Milus Height, MD FACS Surgical Oncology, General Surgery, Trauma and [facility], [place] [phone] Check [email or web address] for coverage night/weekend   10/17/2019  *Care during the described time interval was provided by me. I have reviewed this patient's available data, including medical history, events of note, physical examination and test results as part of my evaluation.

## 2019-10-18 LAB — COMPREHENSIVE METABOLIC PANEL
ALT: 13 U/L (ref 0–44)
AST: 15 U/L (ref 15–41)
Albumin: 1.6 g/dL — ABNORMAL LOW (ref 3.5–5.0)
Alkaline Phosphatase: 133 U/L — ABNORMAL HIGH (ref 38–126)
Anion gap: 8 (ref 5–15)
BUN: 5 mg/dL — ABNORMAL LOW (ref 8–23)
CO2: 22 mmol/L (ref 22–32)
Calcium: 7.9 mg/dL — ABNORMAL LOW (ref 8.9–10.3)
Chloride: 107 mmol/L (ref 98–111)
Creatinine, Ser: 0.48 mg/dL (ref 0.44–1.00)
GFR calc Af Amer: >60 mL/min (ref >60)
GFR calc non Af Amer: >60 mL/min (ref >60)
Glucose, Bld: 157 mg/dL — ABNORMAL HIGH (ref 70–99)
Potassium: 3.6 mmol/L (ref 3.5–5.1)
Sodium: 137 mmol/L (ref 135–145)
Total Bilirubin: 0.5 mg/dL (ref 0.3–1.2)
Total Protein: 4.6 g/dL — ABNORMAL LOW (ref 6.5–8.1)

## 2019-10-18 LAB — GLUCOSE, CAPILLARY
Glucose-Capillary: 144 mg/dL — ABNORMAL HIGH (ref 70–99)
Glucose-Capillary: 129 mg/dL — ABNORMAL HIGH (ref 70–99)
Glucose-Capillary: 119 mg/dL — ABNORMAL HIGH (ref 70–99)

## 2019-10-18 LAB — CBC
HCT: 36.2 % (ref 36.0–46.0)
Hemoglobin: 11.1 g/dL — ABNORMAL LOW (ref 12.0–15.0)
MCH: 30.8 pg (ref 26.0–34.0)
MCHC: 30.7 g/dL (ref 30.0–36.0)
MCV: 100.6 fL — ABNORMAL HIGH (ref 80.0–100.0)
Platelets: 318 10*3/uL (ref 150–400)
RBC: 3.6 MIL/uL — ABNORMAL LOW (ref 3.87–5.11)
RDW: 14.6 % (ref 11.5–15.5)
WBC: 8.2 10*3/uL (ref 4.0–10.5)
nRBC: 0 % (ref 0.0–0.2)

## 2019-10-18 LAB — MAGNESIUM: Magnesium: 1.8 mg/dL (ref 1.7–2.4)

## 2019-10-18 LAB — PHOSPHORUS: Phosphorus: 2.9 mg/dL (ref 2.5–4.6)

## 2019-10-18 MED ORDER — OSMOLITE 1.5 CAL PO LIQD
1000.0000 mL | ORAL | Status: AC
  Administered 2019-10-20: 1000 mL
  Filled 2019-10-18 (×4): qty 1000

## 2019-10-18 MED ORDER — LOPERAMIDE HCL 2 MG PO CAPS
4.0000 mg | ORAL_CAPSULE | Freq: Three times a day (TID) | ORAL | Status: DC
  Administered 2019-10-18: 4 mg via ORAL
  Filled 2019-10-18 (×2): qty 2

## 2019-10-18 NOTE — Progress Notes (Signed)
Tele called this nurse and reported patient experienced 6 beat run SVT. Paged Dr Bobbye Morton and made aware

## 2019-10-18 NOTE — Progress Notes (Signed)
Pt very anxious and tearful at beginning of shift, received prn ativan with good results. Slept well overnight. Still having loose stools but are increasingly more formed each episode. Barrier cream placed to peri area for redness and excoriation

## 2019-10-18 NOTE — Progress Notes (Signed)
Subjective:     BMs getting less liquid after holding reglan and adding imodium. Ct yesterday showed issues with outflow of stomach.  Objective:  Vital signs for last 24 hours: Temp:  [98 F (36.7 C)-99.3 F (37.4 C)] 98.2 F (36.8 C) (11/24 1222) Pulse Rate:  [108-114] 114 (11/24 1222) Resp:  [19-22] 19 (11/23 2314) SpO2:  [95 %-96 %] 96 % (11/24 1222)  Intake/Output from previous day: 11/23 0701 - 11/24 0700 In: 500 [NG/GT:500] Out: 800 [Emesis/NG output:800]  Intake/Output this shift: No intake/output data recorded.  Vent settings for last 24 hours:    Physical Exam:  Gen: comfortable Neuro: non-focal exam HEENT: NGT in place, bilious output Neck: supple CV: RRR Pulm: unlabored breathing Abd: soft, NT, sl distended Extr: wwp   Results for orders placed or performed during the hospital encounter of 10/04/2019 (from the past 24 hour(s))  Glucose, capillary     Status: Abnormal   Collection Time: 10/17/19 12:56 PM  Result Value Ref Range   Glucose-Capillary 103 (H) 70 - 99 mg/dL  Glucose, capillary     Status: None   Collection Time: 10/17/19  3:43 PM  Result Value Ref Range   Glucose-Capillary 89 70 - 99 mg/dL  Glucose, capillary     Status: Abnormal   Collection Time: 10/17/19  7:51 PM  Result Value Ref Range   Glucose-Capillary 124 (H) 70 - 99 mg/dL  Glucose, capillary     Status: Abnormal   Collection Time: 10/17/19 11:25 PM  Result Value Ref Range   Glucose-Capillary 106 (H) 70 - 99 mg/dL   Comment 1 Notify RN    Comment 2 Document in Chart   Glucose, capillary     Status: Abnormal   Collection Time: 10/18/19  3:45 AM  Result Value Ref Range   Glucose-Capillary 144 (H) 70 - 99 mg/dL   Comment 1 Notify RN    Comment 2 Document in Chart   Magnesium     Status: None   Collection Time: 10/18/19  5:08 AM  Result Value Ref Range   Magnesium 1.8 1.7 - 2.4 mg/dL  Phosphorus     Status: None   Collection Time: 10/18/19  5:08 AM  Result Value Ref  Range   Phosphorus 2.9 2.5 - 4.6 mg/dL  CBC     Status: Abnormal   Collection Time: 10/18/19  5:08 AM  Result Value Ref Range   WBC 8.2 4.0 - 10.5 K/uL   RBC 3.60 (L) 3.87 - 5.11 MIL/uL   Hemoglobin 11.1 (L) 12.0 - 15.0 g/dL   HCT 36.2 36.0 - 46.0 %   MCV 100.6 (H) 80.0 - 100.0 fL   MCH 30.8 26.0 - 34.0 pg   MCHC 30.7 30.0 - 36.0 g/dL   RDW 14.6 11.5 - 15.5 %   Platelets 318 150 - 400 K/uL   nRBC 0.0 0.0 - 0.2 %  CMP     Status: Abnormal   Collection Time: 10/18/19  5:08 AM  Result Value Ref Range   Sodium 137 135 - 145 mmol/L   Potassium 3.6 3.5 - 5.1 mmol/L   Chloride 107 98 - 111 mmol/L   CO2 22 22 - 32 mmol/L   Glucose, Bld 157 (H) 70 - 99 mg/dL   BUN 5 (L) 8 - 23 mg/dL   Creatinine, Ser 0.48 0.44 - 1.00 mg/dL   Calcium 7.9 (L) 8.9 - 10.3 mg/dL   Total Protein 4.6 (L) 6.5 - 8.1 g/dL  Albumin 1.6 (L) 3.5 - 5.0 g/dL   AST 15 15 - 41 U/L   ALT 13 0 - 44 U/L   Alkaline Phosphatase 133 (H) 38 - 126 U/L   Total Bilirubin 0.5 0.3 - 1.2 mg/dL   GFR calc non Af Amer >60 >60 mL/min   GFR calc Af Amer >60 >60 mL/min   Anion gap 8 5 - 15  Glucose, capillary     Status: Abnormal   Collection Time: 10/18/19  8:23 AM  Result Value Ref Range   Glucose-Capillary 129 (H) 70 - 99 mg/dL    Assessment & Plan: Present on Admission: . Pancreatic cancer ([place]) . Adenocarcinoma of head of pancreas ([place])    LOS: 21 days   Additional comments:I reviewed the patient's new clinical lab test results.    2F s/p Whipple  Pancreatic cancer - s/p Whipple by Dr. Barry Dienes 11/3 ABL anemia - stable Presumed delayed gastric emptying - continue NGT to suction. May need venting G-tube. Will discuss methods of G tube placement.  Stomach is close to abdominal wall.  FEN - continue TF via feeding J-tube. Consistency of BM is not an indication to hold TF. Increase to goal.   Replete mag and phos. Loperamide to slow GI transit.  DVT - LMWH, PT/OT Severe protein calorie malnutrition.    Milus Height, MD FACS Surgical Oncology, General Surgery, Trauma and [place] [facility], [place] [phone] Check [email or web address] for coverage night/weekend   10/18/2019  *Care during the described time interval was provided by me. I have reviewed this patient's available data, including medical history, events of note, physical examination and test results as part of my evaluation.

## 2019-10-18 NOTE — Progress Notes (Signed)
Physical Therapy Treatment Patient Details Name: Julie Rollins MRN: [id number] DOB: [birth year] Today's Date: 10/18/2019    History of Present Illness 73 y.o. female with medical history significant of pancreatic cancer, breast cancer status post lumpectomy and radiation, hypertension, hypothyroidism admitted s/p Whipple procedure.    PT Comments    Patient refused PT/OT earlier today (~1:15 pm). Returned and she continued to refuse OOB activities, however did agree to do bed level exercises. Hand out created and provided. Continue to emphasize importance of mobility and pt continues to report she just doesn't feel she can do it. Agreed to attempt to see her earlier in the day in the future (she prefers 9-10 a.m.). Reviewed proper use of IS and pt only able to pull 750 ml. Repeated x 5 reps.     Follow Up Recommendations  SNF     Equipment Recommendations  None recommended by PT    Recommendations for Other Services       Precautions / Restrictions Precautions Precautions: Fall;Other (comment) Precaution Comments: NG tube    Mobility  Bed Mobility               General bed mobility comments: refused  Transfers                 General transfer comment: refused  Ambulation/Gait                 Stairs             Wheelchair Mobility    Modified Rankin (Stroke Patients Only)       Balance                                            Cognition Arousal/Alertness: Awake/alert Behavior During Therapy: Agitated(irritated) Overall Cognitive Status: Within Functional Limits for tasks assessed                                 General Comments: pt upset that RN has not been in to check her NG tube (appears secure to her nose, but did let RN know that pt wants to see her)      Exercises General Exercises - Lower Extremity Ankle Circles/Pumps: AROM;Both;10 reps(slow with heel cord stretch) Quad Sets:  AROM;Both;10 reps(5 sec hold) Gluteal Sets: AROM;Both;5 reps(5 sec hold) Other Exercises Other Exercises: pt refused bridges stating she does that all day to gt on/off bedpan; +supine hip IR/ER x 5 reps each  Access Code: [email or web address]  URL: [email or web address]  Date: 10/18/2019  Prepared by: Barry Brunner   Exercises Supine Ankle Pumps - 10 reps - 1 sets - 3x daily - 7x weekly Supine Quadricep Sets - 10 reps - 1 sets - 5 hold - 2x daily - 7x weekly Hip Flexion - 10 reps - 1 sets - 2x daily - 7x weekly Bridge - 5-10 reps - 1 sets - - hold - 2x daily - 7x weekly Supine Gluteal Sets - 10 reps - 1 sets - 5 counts hold - 2x daily - 7x weekly  General Comments        Pertinent Vitals/Pain Pain Assessment: Faces Faces Pain Scale: Hurts little more Pain Location: abdomen Pain Descriptors / Indicators: Discomfort Pain Intervention(s): Limited activity within patient's tolerance;Monitored during session    Home Living  Prior Function            PT Goals (current goals can now be found in the care plan section) Acute Rehab PT Goals Patient Stated Goal: go home PT Goal Formulation: With patient Time For Goal Achievement: 11/04/2019 Potential to Achieve Goals: Good Progress towards PT goals: (goals reassessed due to timeframe)    Frequency    Min 2X/week      PT Plan Current plan remains appropriate    Co-evaluation              AM-PAC PT "6 Clicks" Mobility   Outcome Measure  Help needed turning from your back to your side while in a flat bed without using bedrails?: A Little Help needed moving from lying on your back to sitting on the side of a flat bed without using bedrails?: A Little Help needed moving to and from a bed to a chair (including a wheelchair)?: A Little Help needed standing up from a chair using your arms (e.g., wheelchair or bedside chair)?: A Little Help needed to walk in hospital room?: A Lot Help needed  climbing 3-5 steps with a railing? : Total 6 Click Score: 15    End of Session   Activity Tolerance: Patient tolerated treatment well Patient left: in bed;with call bell/phone within reach Nurse Communication: Other (comment)(refused OOB; wants RN to check NG) PT Visit Diagnosis: Difficulty in walking, not elsewhere classified (R26.2);Muscle weakness (generalized) (M62.81)     Time: [id number] PT Time Calculation (min) (ACUTE ONLY): 11 min  Charges:  $Therapeutic Exercise: 8-22 mins                      Barry Brunner, PT Pager [phone]    Julie Rollins 10/18/2019, 4:21 PM

## 2019-10-18 NOTE — Progress Notes (Signed)
OT Cancellation Note  Patient Details Name: Julie Rollins MRN: [id number] DOB: 03-Dec-1945   Cancelled Treatment:    Reason Eval/Treat Not Completed: Pain limiting ability to participate;Fatigue/lethargy limiting ability to participate Pt declined OT session d/t pain and not "feeling like it." Education provided on importance of working with therapies. Pt still declined session. Will continue to check back as time allows.  Lanier Clam., COTA/L Acute Rehabilitation Services [phone]   [name] 10/18/2019, 1:16 PM

## 2019-10-19 ENCOUNTER — Encounter (HOSPITAL_COMMUNITY): Payer: Self-pay | Admitting: Physician Assistant

## 2019-10-19 DIAGNOSIS — K30 Functional dyspepsia: Secondary | ICD-10-CM | POA: Diagnosis not present

## 2019-10-19 DIAGNOSIS — K2100 Gastro-esophageal reflux disease with esophagitis, without bleeding: Secondary | ICD-10-CM | POA: Diagnosis not present

## 2019-10-19 LAB — GLUCOSE, CAPILLARY
Glucose-Capillary: 128 mg/dL — ABNORMAL HIGH (ref 70–99)
Glucose-Capillary: 99 mg/dL (ref 70–99)
Glucose-Capillary: 79 mg/dL (ref 70–99)
Glucose-Capillary: 110 mg/dL — ABNORMAL HIGH (ref 70–99)

## 2019-10-19 MED ORDER — LOPERAMIDE HCL 1 MG/7.5ML PO SUSP
4.0000 mg | Freq: Four times a day (QID) | ORAL | Status: DC
  Administered 2019-10-19 – 2019-10-22 (×13): 4 mg
  Filled 2019-10-19 (×21): qty 30

## 2019-10-19 MED ORDER — PANTOPRAZOLE SODIUM 40 MG IV SOLR: 40.0000 mg | Freq: Two times a day (BID) | INTRAVENOUS | Status: DC

## 2019-10-19 MED ORDER — PANTOPRAZOLE SODIUM 40 MG IV SOLR
40.0000 mg | Freq: Two times a day (BID) | INTRAVENOUS | Status: DC
  Administered 2019-10-19 – 2019-10-29 (×20): 40 mg via INTRAVENOUS
  Filled 2019-10-19 (×21): qty 40

## 2019-10-19 NOTE — Progress Notes (Signed)
Nutrition Follow-up  DOCUMENTATION CODES:   Not applicable  INTERVENTION:   -D/c Boost Breeze -Continue Osmolite 1.5 formula via J-tube at 50 ml/hr  Provide30 ml ProstatBID via J-tube.   Tube feeding to provide 2000 kcal (100% of needs), 105 grams of protein, and 912 ml water.  NUTRITION DIAGNOSIS:   Increased nutrient needs related to cancer and cancer related treatments as evidenced by estimated needs.  Ongoing  GOAL:   Patient will meet greater than or equal to 90% of their needs  Met with TF  MONITOR:   Diet advancement, Skin, TF tolerance, Weight trends, Labs, I & O's  REASON FOR ASSESSMENT:   Consult Enteral/tube feeding initiation and management  ASSESSMENT:   Pt with PMH of breast cancer s/p lumpectomy and XRT, hypothyroidism, HTN, dx with pancreatic cancer May 2020 s/p ERCP with stent, chemo (complicated by nausea, weight loss, duodenal obstruction requiring TPN) now admitted for pancreaticoduodenectomy with cholecystectomy with removal of 4 cm from pancreatic head.  Procedure(11/3): WHIPPLE PROCEDURE (Abdomen) GASTROJEJUNOSTOMY, J TUBE (Abdomen) for adenocarcinoma of the pancreas, s/p neoadjuvant chemo and neoadjuvant SBRT  11/17- NGT inserted 11/19- TF restarted  Reviewed I/O's: +3.6 L x 24 hours and +8.4 L since 10/05/19  NGT output: 1.1 L x 24 hours  Per GI notes, plan for EGD on Friday, 10/24/2019. General surgery notes reports pt with slow GI transit and may require venting g-tube.   TF disconnected at time of visit (pt just finished bathing). Tolerating well.   Medications reviewed and include dextrose 5% and 0.9% NaCl with KCl 20 mEq/L infusion @ 100 ml/hr.   Labs reviewed: CBGS: 99-129 (inpatient orders for glycemic control are 2-6 units insulin aspart every 4 hours and 5 units insulin detemir every 12 hours).   Diet Order:   Diet Order            Diet NPO time specified Except for: Ice Chips, Sips with Meds  Diet effective now               EDUCATION NEEDS:   No education needs have been identified at this time  Skin:  Skin Assessment: Skin Integrity Issues: Skin Integrity Issues:: Incisions Incisions: abdomen  Last BM:  10/19/19  Height:   Ht Readings from Last 1 Encounters:  10/23/2019 '5\' 4"'$  (1.626 m)    Weight:   Wt Readings from Last 1 Encounters:  10/17/19 74.5 kg    Ideal Body Weight:  54.5 kg  BMI:  Body mass index is 28.19 kg/m.  Estimated Nutritional Needs:   Kcal:  2000-2200  Protein:  100-115 grams  Fluid:  2L/day    Tyre Beaver A. Jimmye Norman, RD, LDN, [place] Registered Dietitian II Certified Diabetes Care and Education Specialist Pager: [phone] After hours Pager: [phone]

## 2019-10-19 NOTE — Consult Note (Addendum)
[facility] Consult: 2:26 PM 10/19/2019  LOS: 22 days    Referring Provider: Dr Barry Dienes  Primary Care Physician:  Nolene Ebbs, MD Primary Gastroenterologist:  Dr. Silverio Decamp    Reason for Consultation: High NG tube output.   HPI: Julie Rollins is a 73 y.o. female.  Past medical history breast cancer, treated with lumpectomy and radiation in 2000.  Hypothyroidism.  Anxiety.  Most surgeries include abdominal hysterectomy and hernia repair. 04/2016 colonoscopy, initial average risk screening.  No polyps.  Multiple sigmoid, descending diverticulosis.  Nonbleeding internal hemorrhoids   03/29/2019 ERCP and EUS.  Dr. Ardis Hughs for evaluation of obstructive jaundice and pancreatic mass on CT.  Endoscopic findings included severe, grade D ulcerative esophagitis.  Irregular mass at head of pancreas obstructing the dilated CBD but does not obstruct or dilate the pancreatic duct.  Mass does not involve adjacent vascular structures.  FNA cytology: . At ERCP there was a malignant appearing distal common bile duct stricture sampled with brush and for sonometer long, 10 mm diameter fully covered metal stent placed.  06/09/2019 upper EUS. Small quantity fluid in the stomach.  Edema and incomplete luminal narrowing at distal duodenal bulb, unable to advance scope beyond this.  EUS showed round, irregularly bordered hyperechoic mass at the pancreatic head.  Previously placed metal stent passes directly adjacent to the mass within the CBD.  Using [employer] system 4-preloaded fiducial markers placed into the pancreatic mass via transduodenal approach. 06/17/2019 EGD for persistent vomiting.  Dr. Carlean Purl.  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  Biliary stent could be contributing/causing this by irritating the  duodenal wall but this is doubtful suspect the tumor/mass-effect is the reason for the stenosis.  Slight amount of food, large amount of fluid and debris retained in the stomach  Underwent chemotherapy and radiation treatment and then 09/26/2019 Whipple procedure/gastrojejunostomy, cholecystectomy.  Patient has required NG tube suction after surgery.  She denies nausea and vomiting, just says that she coughs a lot when the NG tube was not in place.  Output from NG tube measured anywhere from 800 to 1.9 L in the last 4 days.  However she has been continuing on tube feeds via jejunostomy tube.  Has severe protein calorie malnutrition.  Has multiple liquid stools which she attributes to the tube feedings.  Some abdominal pain but this is not severe in the left abdomen. Latest CTAP w contrast 11/23: Lower esophagus and stomach filled with contrast.  Duodenum edematous and filled with contrast.  Efferent limb distal to gastrojejunostomy decompressed with minimal contrast in decompressed bowels.  Findings suggest at least some degree of obstruction in the efferent limb at the anastomosis.  Also seen: Pleural effusions, moderate ascites, anasarca.    Meds in use include Protonix 40 mg p.o. daily  Past Medical History:  Diagnosis Date   Anxiety    Blood transfusion without reported diagnosis    patient refuses bld products -albumin ok - refusal consent on patient's chart   Breast CA ([place])    s/p lumpectomy and radiation 2000  Breast cancer ([place])    Colitis    2007   Dyspnea    Dysrhythmia    Family history of bladder cancer    Family history of stomach cancer    Family history of throat cancer    Hernia, epigastric    HTN (hypertension)    "mild hypertension", was on BP med years ago but it caused orthostatic hypotension and she has not been medicated since   Hypothyroid    Pancreatic cancer ([place]) dx'd 03/2019   Personal history of breast cancer    Personal history of radiation  therapy 2001   Poor appetite 04/2019   Vertigo    Vertigo     Past Surgical History:  Procedure Laterality Date   ABDOMINAL HYSTERECTOMY     44yrs ago   ANTERIOR CERVICAL DECOMP/DISCECTOMY FUSION N/A 08/02/2018   Procedure: ANTERIOR CERVICAL DECOMPRESSION/DISCECTOMY FUSION CERVICAL FIVE- CERVICAL SIX;  Surgeon: Earnie Larsson, MD;  Location: [place];  Service: Neurosurgery;  Laterality: N/A;  ANTERIOR CERVICAL DECOMPRESSION/DISCECTOMY FUSION CERVICAL FIVE- CERVICAL SIX   BILIARY BRUSHING  03/29/2019   Procedure: BILIARY BRUSHING;  Surgeon: [name], MD;  Location: [name] ENDOSCOPY;  Service: Endoscopy;;   BILIARY STENT PLACEMENT  03/29/2019   Procedure: BILIARY STENT PLACEMENT;  Surgeon: [name], MD;  Location: [facility] ENDOSCOPY;  Service: Endoscopy;;   BIOPSY  03/29/2019   Procedure: BIOPSY;  Surgeon: [name], MD;  Location: [facility] ENDOSCOPY;  Service: Endoscopy;;   BREAST LUMPECTOMY     2000   ERCP N/A 03/29/2019   Procedure: ENDOSCOPIC RETROGRADE CHOLANGIOPANCREATOGRAPHY (ERCP);  Surgeon: [name], MD;  Location: [facility] ENDOSCOPY;  Service: Endoscopy;  Laterality: N/A;   ESOPHAGOGASTRODUODENOSCOPY N/A 06/17/2019   Procedure: ESOPHAGOGASTRODUODENOSCOPY (EGD);  Surgeon: Gatha Mayer, MD;  Location: [facility] ENDOSCOPY;  Service: Gastroenterology;  Laterality: N/A;   ESOPHAGOGASTRODUODENOSCOPY (EGD) WITH PROPOFOL N/A 03/29/2019   Procedure: ESOPHAGOGASTRODUODENOSCOPY (EGD) WITH PROPOFOL;  Surgeon: [name], MD;  Location: [facility] ENDOSCOPY;  Service: Endoscopy;  Laterality: N/A;   ESOPHAGOGASTRODUODENOSCOPY (EGD) WITH PROPOFOL N/A 06/09/2019   Procedure: ESOPHAGOGASTRODUODENOSCOPY (EGD) WITH PROPOFOL;  Surgeon: [name], MD;  Location: [facility] ENDOSCOPY;  Service: Endoscopy;  Laterality: N/A;   EUS N/A 03/29/2019   Procedure: UPPER ENDOSCOPIC ULTRASOUND (EUS) LINEAR;  Surgeon: [name], MD;  Location: [facility] ENDOSCOPY;  Service: Endoscopy;  Laterality: N/A;   EUS N/A  06/09/2019   Procedure: UPPER ENDOSCOPIC ULTRASOUND (EUS) LINEAR;  Surgeon: [name], MD;  Location: [facility] ENDOSCOPY;  Service: Endoscopy;  Laterality: N/A;   FIDUCIAL MARKER PLACEMENT  06/09/2019   Procedure: FIDUCIAL MARKER PLACEMENT;  Surgeon: [name], MD;  Location: [facility] ENDOSCOPY;  Service: Endoscopy;;   FINE NEEDLE ASPIRATION  03/29/2019   Procedure: FINE NEEDLE ASPIRATION (FNA);  Surgeon: [name], MD;  Location: [facility] ENDOSCOPY;  Service: Endoscopy;;   GASTROJEJUNOSTOMY N/A 10/13/2019   Procedure: Jason Fila;  Surgeon:  Klein, MD;  Location: [place];  Service: General;  Laterality: N/A;  GENERAL AND EPIDURAL   HERNIA REPAIR     20+ years ago   IR CV LINE INJECTION  08/23/2019   LAPAROSCOPY N/A 10/15/2019   Procedure: Laparoscopy Diagnostic;  Surgeon:  Klein, MD;  Location: [place];  Service: General;  Laterality: N/A;   PORT-A-CATH REMOVAL N/A 09/13/2019   Procedure: REMOVAL PORT-A-CATH;  Surgeon:  Klein, MD;  Location: [facility] ORS;  Service: General;  Laterality: N/A;   PORTACATH PLACEMENT N/A 04/12/2019   Procedure: INSERTION PORT-A-CATH LEFT  SUBCLAVIAN;  Surgeon:  Klein, MD;  Location: [place];  Service: General;  Laterality: N/A;   RETINAL DETACHMENT SURGERY     SPHINCTEROTOMY  03/29/2019   Procedure: [id number];  Surgeon: [name], MD;  Location: [facility] ENDOSCOPY;  Service: Endoscopy;;   WHIPPLE PROCEDURE N/A 10/13/2019   Procedure: WHIPPLE PROCEDURE;  Surgeon:  Klein, MD;  Location: [place];  Service: General;  Laterality: N/A;  GENERAL AND EPIDURAL    Prior to Admission medications   Medication Sig Start Date End Date Taking? Authorizing Provider  diltiazem (CARDIZEM CD) 180 MG 24 hr capsule Take 180 mg by mouth daily. 05/18/18  Yes [provider]  hydrocortisone (ANUSOL-HC) 2.5 % rectal cream Place rectally 2 (two) times daily. Patient taking differently: Place 1 application rectally  daily as needed for hemorrhoids.  06/23/19  Yes Samuella Cota, MD  lactose free nutrition (BOOST) LIQD Take 237 mLs by mouth 3 (three) times daily between meals.   Yes [provider]  levothyroxine (SYNTHROID, LEVOTHROID) 50 MCG tablet Take 50 mcg by mouth every morning.   Yes [provider]  Multiple Vitamin (MULTIVITAMIN WITH MINERALS) TABS tablet Take 2 tablets by mouth daily.    Yes [provider]  pantoprazole (PROTONIX) 40 MG tablet Take 1 tablet (40 mg total) by mouth daily. 09/17/19 10/17/19 Yes Hongalgi, Lenis Dickinson, MD  Amino Acids-Protein Hydrolys (FEEDING SUPPLEMENT, PRO-STAT SUGAR FREE 64,) LIQD Take 30 mLs by mouth 2 (two) times daily. Patient not taking: Reported on 09/20/2019 09/17/19   Modena Jansky, MD  feeding supplement, ENSURE ENLIVE, (ENSURE ENLIVE) LIQD Take 237 mLs by mouth 2 (two) times daily between meals. Patient not taking: Reported on 09/20/2019 09/17/19   Modena Jansky, MD  potassium chloride 20 MEQ/15ML (10%) SOLN Take 15 mLs (20 mEq total) by mouth daily. Patient not taking: Reported on 06/01/2019 05/04/19 06/12/19  Alla Feeling, NP    Scheduled Meds:  chlorhexidine  15 mL Mouth Rinse BID   Chlorhexidine Gluconate Cloth  6 each Topical Daily   dextromethorphan-guaiFENesin  1 tablet Oral BID   enoxaparin (LOVENOX) injection  40 mg Subcutaneous Q24H   feeding supplement  1 Container Oral BID BM   feeding supplement (PRO-STAT SUGAR FREE 64)  30 mL Per Tube BID   fibrin sealant-human  4 mL Topical Once   insulin aspart  2-6 Units Subcutaneous Q4H   insulin detemir  5 Units Subcutaneous Q12H   levothyroxine  50 mcg Oral Q0600   lidocaine  1 patch Transdermal Q24H   loperamide HCl  4 mg Per Tube QID   mouth rinse  15 mL Mouth Rinse q12n4p   pantoprazole  40 mg Oral Q1200   Infusions:  sodium chloride Stopped (10/03/19 0942)   dextrose 5 % and 0.9 % NaCl with KCl 20 mEq/L 100 mL/hr at 10/19/19 0347    feeding supplement (OSMOLITE 1.5 CAL)     PRN Meds: sodium chloride, diphenhydrAMINE **OR** diphenhydrAMINE, Gerhardt's butt cream, hydrocortisone, HYDROmorphone (DILAUDID) injection, LORazepam, magic mouthwash, methocarbamol, metoprolol tartrate, ondansetron **OR** ondansetron (ZOFRAN) IV, oxyCODONE   Allergies as of 08/09/2019 - Review Complete 08/04/2019  Allergen Reaction Noted   Codeine Nausea Only 02/12/2012    Family History  Problem Relation Age of Onset   Bladder Cancer Brother    Other Other        Denies family h/o cardiac disease   Stomach cancer Maternal Uncle    Throat cancer Maternal Uncle  Social History   Socioeconomic History   Marital status: Divorced    Spouse name: Not on file   Number of children: Not on file   Years of education: Not on file   Highest education level: Not on file  Occupational History   Occupation: retired    Comment: [place] resource strain: Not on file   Food insecurity    Worry: Not on file    Inability: Not on file   Transportation needs    Medical: No    Non-medical: No  Tobacco Use   Smoking status: Never Smoker   Smokeless tobacco: Never Used  Substance and Sexual Activity   Alcohol use: No   Drug use: No   Sexual activity: Not Currently  Lifestyle   Physical activity    Days per week: Not on file    Minutes per session: Not on file   Stress: Not on file  Relationships   Social connections    Talks on phone: Not on file    Gets together: Not on file    Attends religious service: Not on file    Active member of club or organization: Not on file    Attends meetings of clubs or organizations: Not on file    Relationship status: Not on file   Intimate partner violence    Fear of current or ex partner: Not on file    Emotionally abused: Not on file    Physically abused: Not on file    Forced sexual activity: Not on file  Other Topics Concern   Not on file   Social History Narrative   Lives alone    REVIEW OF SYSTEMS: Constitutional: Feels weak. ENT:  No nose bleeds Pulm: No trouble breathing. CV:  No palpitations, no LE edema.  GU:  No hematuria, no frequency GI: See HPI. Heme: No unusual bleeding or bruising Transfusions: None Neuro:  No headaches, no peripheral tingling or numbness.  No syncope, no seizures. Derm:  No itching, no rash or sores.  Endocrine:  No sweats or chills.  No polyuria or dysuria Immunization: Flu shot received 07/2019. Travel:  None beyond local counties in last few months.    PHYSICAL EXAM: Vital signs in last 24 hours: Vitals:   10/19/19 1035 10/19/19 1238  BP: 131/62 140/82  Pulse: (!) 112 (!) 115  Resp: 20 20  Temp: 98.4 F (36.9 C) 98.8 F (37.1 C)  SpO2: 96% 99%   Wt Readings from Last 3 Encounters:  10/17/19 74.5 kg  09/22/19 59.8 kg  09/12/19 61.2 kg    General: Frail, ill looking, alert, anxious, somewhat uncomfortable looking WF Head: No facial asymmetry or swelling.  No signs of head trauma. Eyes: No scleral icterus.  No conjunctival pallor.  EOMI. Ears: Hard of hearing. Nose: No congestion or discharge.  Grass green fluid bilious output in NG canister. Mouth: Clear, pink oral mucosa. Neck: No JVD, no masses, no thyromegaly. Lungs: No labored breathing or cough.  Lungs clear bilaterally Heart: RRR.  No MRG.  S1, S2 present. Abdomen: Soft but distended.  Slight mild tenderness in the left mid to lower abdomen.  Feeding J-tube positioned on left..   Rectal: Deferred Musc/Skeltl: No gross joint redness, swelling or deformity. Extremities: 1+ pitting edema in the lower extremities with extensive anasarca up into her abdomen. Neurologic: Alert.  Oriented x3.  Moves all 4 limbs, strength not tested. Skin: No rash, no sores, no large  hematomas. Nodes: No cervical adenopathy. Psych: Anxious but cooperative.  Intake/Output from previous day: 11/24 0701 - 11/25 0700 In: 4743.5  [I.V.:4733.5; NG/GT:10] Out: 1100 [Emesis/NG output:1100] Intake/Output this shift: No intake/output data recorded.  LAB RESULTS: Recent Labs    10/17/19 0622 10/18/19 0508  WBC 7.7 8.2  HGB 10.8* 11.1*  HCT 34.1* 36.2  PLT 339 318   BMET Lab Results  Component Value Date   NA 137 10/18/2019   NA 137 10/17/2019   NA 138 10/16/2019   K 3.6 10/18/2019   K 3.9 10/17/2019   K 3.6 10/16/2019   CL 107 10/18/2019   CL 108 10/17/2019   CL 108 10/16/2019   CO2 22 10/18/2019   CO2 23 10/17/2019   CO2 23 10/16/2019   GLUCOSE 157 (H) 10/18/2019   GLUCOSE 100 (H) 10/17/2019   GLUCOSE 97 10/16/2019   BUN 5 (L) 10/18/2019   BUN 5 (L) 10/17/2019   BUN <5 (L) 10/16/2019   CREATININE 0.48 10/18/2019   CREATININE 0.54 10/17/2019   CREATININE 0.51 10/16/2019   CALCIUM 7.9 (L) 10/18/2019   CALCIUM 7.8 (L) 10/17/2019   CALCIUM 7.9 (L) 10/16/2019   LFT Recent Labs    10/17/19 0622 10/18/19 0508  PROT 4.4* 4.6*  ALBUMIN 1.6* 1.6*  AST 12* 15  ALT 12 13  ALKPHOS 120 133*  BILITOT 0.2* 0.5   PT/INR Lab Results  Component Value Date   INR 1.5 (H) 09/28/2019   INR 1.2 09/26/2019   INR 1.5 (H) 10/06/2019   Hepatitis Panel No results for input(s): HEPBSAG, HCVAB, HEPAIGM, HEPBIGM in the last 72 hours. C-Diff No components found for: CDIFF Lipase     Component Value Date/Time   LIPASE 37 09/22/2019 1100    Drugs of Abuse  No results found for: LABOPIA, COCAINSCRNUR, LABBENZ, AMPHETMU, THCU, LABBARB   RADIOLOGY STUDIES: Dg Abd Portable 1v  Result Date: 10/17/2019 CLINICAL DATA:  73 year old female status post NG tube placement. EXAM: PORTABLE ABDOMEN - 1 VIEW COMPARISON:  Radiograph dated 10/11/2019. FINDINGS: Enteric tube with tip in the distal stomach. No bowel dilatation or evidence of obstruction. Excreted contrast noted in the renal collecting system and bladder. Degenerative changes of the spine. No acute osseous pathology. IMPRESSION: Enteric tube with tip in the  distal stomach. Electronically Signed   By: Anner Crete M.D.   On: 10/17/2019 16:05     IMPRESSION:   *    3 weeks post Whipple procedure for pancreatic cancer.  Delayed gastric emptying versus GJ swelling with partial obstruction.  Bilious output from NGT for ~ 1 week ? patency of GJ afferent limb.    *    Frequent, diarrheal stools.  Dr. Barry Dienes has ordered 4 times daily Imodium C. difficile negative but that was in May and June 2020.  Will order stools to recheck for C. Difficile.  Suspect diarrhea is from tube feedings and bowel edema/malabsorption  associated with malnutrition.   PLAN:     *   Dr Fuller Plan will see pt.  Dr Collene Mares on call for tmrw, Dr Fuller Plan resumes care on 11/27.   EGD on Friday 11/27.  *   Stool for C diff.    *   Given findings on previous EGDs of severe esophagitis, I switched out enteric Protonix for Protonix 40 IV bid.   Addendum at 330 PM: Called by RN to look at pt's stool, which is about 20 CC of watery, pnk/bloody stool.   This is new, BM's  earlier today were brown liquid/watery.     Azucena Freed  10/19/2019, 2:26 PM Phone [phone]    Attending Physician Note   I have taken a history, examined the patient and reviewed the chart. I agree with the Advanced Practitioner's note, impression and recommendations.   Delayed gastric emptying 3 weeks post Whipple for pancreatic cancer Diarrhea, R/O C diff Severe esophagitis previously diagnosed  EGD on Friday Stool for C diff Change pantoprazole to IV for now  Lucio Edward, MD [facility] Gastroenterology

## 2019-10-19 NOTE — Progress Notes (Signed)
Physical Therapy Treatment Patient Details Name: Julie Rollins MRN: [id number] DOB: 02/12/1946 Today's Date: 10/19/2019    History of Present Illness 73 y.o. female with medical history significant of pancreatic cancer, breast cancer status post lumpectomy and radiation, hypertension, hypothyroidism admitted s/p Whipple procedure.    PT Comments    Pt admitted for above diagnosis. Pt had a BM upon arrival. Completed pericare in sidelying and standing +2 for balance. Pt refused to move to recliner. Ambulated 2 ft to [facility] and an additional 8 ft around the room min guard before returning to bed. Pt requires cueing for hand placement throughout session as well as cueing for walker placement during gait training. Pt states she "wants her pills." RN notified. Pt would benefit from continued acute PT services to address deficits.   Follow Up Recommendations  SNF     Equipment Recommendations  None recommended by PT    Recommendations for Other Services       Precautions / Restrictions Precautions Precautions: Fall;Other (comment) Precaution Comments: NG tube Restrictions Weight Bearing Restrictions: No    Mobility  Bed Mobility Overal bed mobility: Needs Assistance Bed Mobility: Sit to Supine;Rolling;Sidelying to Sit Rolling: Min guard Sidelying to sit: Min assist   Sit to supine: Min assist;HOB elevated   General bed mobility comments: pt able to roll with min guard for pericare; pt required assistance returning LEs to bed for sit to supine   Transfers Overall transfer level: Needs assistance Equipment used: Rolling walker (2 wheeled) Transfers: Sit to/from Stand Sit to Stand: Min assist         General transfer comment: pt able to complete sit to stand with min A with cueing for hand placement during transfer to [facility] and to bed  Ambulation/Gait Ambulation/Gait assistance: Min guard Gait Distance (Feet): 10 Feet Assistive device: Rolling walker (2 wheeled) Gait  Pattern/deviations: Decreased stride length;Step-through pattern;Trunk flexed Gait velocity: decreased   General Gait Details: slow, slightly unsteady gait with repeated cues for RW placement; 2 ft to [facility], 8 ft around room   Stairs             Wheelchair Mobility    Modified Rankin (Stroke Patients Only)       Balance Overall balance assessment: Needs assistance Sitting-balance support: Feet supported;No upper extremity supported Sitting balance-Leahy Scale: Fair Sitting balance - Comments: able to maintain upright sitting balance for short period of time   Standing balance support: Bilateral upper extremity supported;During functional activity Standing balance-Leahy Scale: Poor Standing balance comment: Requires BUE support for standing                            Cognition Arousal/Alertness: Awake/alert Behavior During Therapy: Anxious Overall Cognitive Status: Within Functional Limits for tasks assessed                                        Exercises      General Comments General comments (skin integrity, edema, etc.): pt notes pain on buttocks after pericare; placed cream on area      Pertinent Vitals/Pain Pain Assessment: Faces Faces Pain Scale: Hurts little more Pain Location: abdomen Pain Descriptors / Indicators: Discomfort Pain Intervention(s): Limited activity within patient's tolerance;Monitored during session;Patient requesting pain meds-RN notified    Home Living  Prior Function            PT Goals (current goals can now be found in the care plan section) Acute Rehab PT Goals Patient Stated Goal: go home Progress towards PT goals: Progressing toward goals    Frequency    Min 2X/week      PT Plan Current plan remains appropriate    Co-evaluation              AM-PAC PT "6 Clicks" Mobility   Outcome Measure  Help needed turning from your back to your side while in a  flat bed without using bedrails?: A Little Help needed moving from lying on your back to sitting on the side of a flat bed without using bedrails?: A Little Help needed moving to and from a bed to a chair (including a wheelchair)?: A Little Help needed standing up from a chair using your arms (e.g., wheelchair or bedside chair)?: A Little Help needed to walk in hospital room?: A Little Help needed climbing 3-5 steps with a railing? : Total 6 Click Score: 16    End of Session Equipment Utilized During Treatment: Gait belt Activity Tolerance: Patient tolerated treatment well;Patient limited by fatigue Patient left: in bed;with call bell/phone within reach Nurse Communication: Mobility status;Patient requests pain meds PT Visit Diagnosis: Difficulty in walking, not elsewhere classified (R26.2);Muscle weakness (generalized) (M62.81)     Time: [id number] PT Time Calculation (min) (ACUTE ONLY): 27 min  Charges:  $Gait Training: 8-22 mins $Therapeutic Activity: 8-22 mins                     Silvana Newness, SPT    Bayard Joanell Cressler 10/19/2019, 10:43 AM

## 2019-10-19 NOTE — TOC Progression Note (Signed)
Transition of Care [name]) - Progression Note    Patient Details  Name: Julie Rollins MRN: [id number] Date of Birth: 1946/04/23  [facility] of [facility] CM/SW [place], [place] Phone Number: 10/19/2019, 4:13 PM  Clinical Narrative:    [facility] team continues to follow for medical stability to refer for SNF placement.   Expected Discharge Plan: [place] Barriers to Discharge: Continued Medical Work up, Ship broker  Expected Discharge Plan and Services Expected Discharge Plan: [place] In-house Referral: Clinical Social Work Discharge Planning Services: CM Consult Post Acute Care Choice: [place] arrangements for the past 2 months: [place] Determinants of Health (SDOH) Interventions    Readmission Risk Interventions Readmission Risk Prevention Plan 09/30/2019 09/13/2019  Transportation Screening Complete Complete  Medication Review Press photographer) Referral to Pharmacy Referral to Pharmacy  PCP or Specialist appointment within 3-5 days of discharge Complete Not Complete  PCP/Specialist Appt Not Complete comments - pt established at [facility] and with PCP but DC date unknown  Alligator or Home Care Consult Complete Complete  SW Recovery Care/Counseling Consult Complete Complete  Palliative Care Screening Not Complete Not Complete  Comments - pt in procedure did not discuss if any past palliative care involvement, however none in current encounter  [place] Complete Not Applicable  Some recent data might be hidden

## 2019-10-19 NOTE — H&P (View-Only) (Signed)
[facility] Consult: 2:26 PM 10/19/2019  LOS: 22 days    Referring Provider: Dr Barry Dienes  Primary Care Physician:  Nolene Ebbs, MD Primary Gastroenterologist:  Dr. Silverio Decamp    Reason for Consultation: High NG tube output.   HPI: Julie Rollins is a 73 y.o. female.  Past medical history breast cancer, treated with lumpectomy and radiation in 2000.  Hypothyroidism.  Anxiety.  Most surgeries include abdominal hysterectomy and hernia repair. 04/2016 colonoscopy, initial average risk screening.  No polyps.  Multiple sigmoid, descending diverticulosis.  Nonbleeding internal hemorrhoids   03/29/2019 ERCP and EUS.  Dr. Ardis Hughs for evaluation of obstructive jaundice and pancreatic mass on CT.  Endoscopic findings included severe, grade D ulcerative esophagitis.  Irregular mass at head of pancreas obstructing the dilated CBD but does not obstruct or dilate the pancreatic duct.  Mass does not involve adjacent vascular structures.  FNA cytology: . At ERCP there was a malignant appearing distal common bile duct stricture sampled with brush and for sonometer long, 10 mm diameter fully covered metal stent placed.  06/09/2019 upper EUS. Small quantity fluid in the stomach.  Edema and incomplete luminal narrowing at distal duodenal bulb, unable to advance scope beyond this.  EUS showed round, irregularly bordered hyperechoic mass at the pancreatic head.  Previously placed metal stent passes directly adjacent to the mass within the CBD.  Using [employer] system 4-preloaded fiducial markers placed into the pancreatic mass via transduodenal approach. 06/17/2019 EGD for persistent vomiting.  Dr. Carlean Purl.  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  Biliary stent could be contributing/causing this by irritating the  duodenal wall but this is doubtful suspect the tumor/mass-effect is the reason for the stenosis.  Slight amount of food, large amount of fluid and debris retained in the stomach  Underwent chemotherapy and radiation treatment and then 10/24/2019 Whipple procedure/gastrojejunostomy, cholecystectomy.  Patient has required NG tube suction after surgery.  She denies nausea and vomiting, just says that she coughs a lot when the NG tube was not in place.  Output from NG tube measured anywhere from 800 to 1.9 L in the last 4 days.  However she has been continuing on tube feeds via jejunostomy tube.  Has severe protein calorie malnutrition.  Has multiple liquid stools which she attributes to the tube feedings.  Some abdominal pain but this is not severe in the left abdomen. Latest CTAP w contrast 11/23: Lower esophagus and stomach filled with contrast.  Duodenum edematous and filled with contrast.  Efferent limb distal to gastrojejunostomy decompressed with minimal contrast in decompressed bowels.  Findings suggest at least some degree of obstruction in the efferent limb at the anastomosis.  Also seen: Pleural effusions, moderate ascites, anasarca.    Meds in use include Protonix 40 mg p.o. daily  Past Medical History:  Diagnosis Date   Anxiety    Blood transfusion without reported diagnosis    patient refuses bld products -albumin ok - refusal consent on patient's chart   Breast CA ([place])    s/p lumpectomy and radiation 2000  Breast cancer ([place])    Colitis    2007   Dyspnea    Dysrhythmia    Family history of bladder cancer    Family history of stomach cancer    Family history of throat cancer    Hernia, epigastric    HTN (hypertension)    "mild hypertension", was on BP med years ago but it caused orthostatic hypotension and she has not been medicated since   Hypothyroid    Pancreatic cancer ([place]) dx'd 03/2019   Personal history of breast cancer    Personal history of radiation  therapy 2001   Poor appetite 04/2019   Vertigo    Vertigo     Past Surgical History:  Procedure Laterality Date   ABDOMINAL HYSTERECTOMY     72yrs ago   ANTERIOR CERVICAL DECOMP/DISCECTOMY FUSION N/A 08/02/2018   Procedure: ANTERIOR CERVICAL DECOMPRESSION/DISCECTOMY FUSION CERVICAL FIVE- CERVICAL SIX;  Surgeon: Earnie Larsson, MD;  Location: [facility];  Service: Neurosurgery;  Laterality: N/A;  ANTERIOR CERVICAL DECOMPRESSION/DISCECTOMY FUSION CERVICAL FIVE- CERVICAL SIX   BILIARY BRUSHING  03/29/2019   Procedure: BILIARY BRUSHING;  Surgeon: [name], MD;  Location: [facility] ENDOSCOPY;  Service: Endoscopy;;   BILIARY STENT PLACEMENT  03/29/2019   Procedure: BILIARY STENT PLACEMENT;  Surgeon: [name], MD;  Location: [facility] ENDOSCOPY;  Service: Endoscopy;;   BIOPSY  03/29/2019   Procedure: BIOPSY;  Surgeon: [name], MD;  Location: [facility] ENDOSCOPY;  Service: Endoscopy;;   BREAST LUMPECTOMY     2000   ERCP N/A 03/29/2019   Procedure: ENDOSCOPIC RETROGRADE CHOLANGIOPANCREATOGRAPHY (ERCP);  Surgeon: [name], MD;  Location: [facility] ENDOSCOPY;  Service: Endoscopy;  Laterality: N/A;   ESOPHAGOGASTRODUODENOSCOPY N/A 06/17/2019   Procedure: ESOPHAGOGASTRODUODENOSCOPY (EGD);  Surgeon: Gatha Mayer, MD;  Location: [facility] ENDOSCOPY;  Service: Gastroenterology;  Laterality: N/A;   ESOPHAGOGASTRODUODENOSCOPY (EGD) WITH PROPOFOL N/A 03/29/2019   Procedure: ESOPHAGOGASTRODUODENOSCOPY (EGD) WITH PROPOFOL;  Surgeon: [name], MD;  Location: [facility] ENDOSCOPY;  Service: Endoscopy;  Laterality: N/A;   ESOPHAGOGASTRODUODENOSCOPY (EGD) WITH PROPOFOL N/A 06/09/2019   Procedure: ESOPHAGOGASTRODUODENOSCOPY (EGD) WITH PROPOFOL;  Surgeon: [name], MD;  Location: [facility] ENDOSCOPY;  Service: Endoscopy;  Laterality: N/A;   EUS N/A 03/29/2019   Procedure: UPPER ENDOSCOPIC ULTRASOUND (EUS) LINEAR;  Surgeon: [name], MD;  Location: [facility] ENDOSCOPY;  Service: Endoscopy;  Laterality: N/A;   EUS N/A  06/09/2019   Procedure: UPPER ENDOSCOPIC ULTRASOUND (EUS) LINEAR;  Surgeon: [name], MD;  Location: [facility] ENDOSCOPY;  Service: Endoscopy;  Laterality: N/A;   FIDUCIAL MARKER PLACEMENT  06/09/2019   Procedure: FIDUCIAL MARKER PLACEMENT;  Surgeon: [name], MD;  Location: [facility] ENDOSCOPY;  Service: Endoscopy;;   FINE NEEDLE ASPIRATION  03/29/2019   Procedure: FINE NEEDLE ASPIRATION (FNA);  Surgeon: [name], MD;  Location: [facility] ENDOSCOPY;  Service: Endoscopy;;   GASTROJEJUNOSTOMY N/A 10/13/2019   Procedure: Jason Fila;  Surgeon:  Klein, MD;  Location: [place];  Service: General;  Laterality: N/A;  GENERAL AND EPIDURAL   HERNIA REPAIR     20+ years ago   IR CV LINE INJECTION  08/23/2019   LAPAROSCOPY N/A 10/23/2019   Procedure: Laparoscopy Diagnostic;  Surgeon:  Klein, MD;  Location: [place];  Service: General;  Laterality: N/A;   PORT-A-CATH REMOVAL N/A 09/13/2019   Procedure: REMOVAL PORT-A-CATH;  Surgeon:  Klein, MD;  Location: [facility] ORS;  Service: General;  Laterality: N/A;   PORTACATH PLACEMENT N/A 04/12/2019   Procedure: INSERTION PORT-A-CATH LEFT  SUBCLAVIAN;  Surgeon:  Klein, MD;  Location: [place];  Service: General;  Laterality: N/A;   RETINAL DETACHMENT SURGERY     SPHINCTEROTOMY  03/29/2019   Procedure: [id number];  Surgeon: [name], MD;  Location: [facility] ENDOSCOPY;  Service: Endoscopy;;   WHIPPLE PROCEDURE N/A 10/17/2019   Procedure: WHIPPLE PROCEDURE;  Surgeon:  Klein, MD;  Location: [place];  Service: General;  Laterality: N/A;  GENERAL AND EPIDURAL    Prior to Admission medications   Medication Sig Start Date End Date Taking? Authorizing Provider  diltiazem (CARDIZEM CD) 180 MG 24 hr capsule Take 180 mg by mouth daily. 05/18/18  Yes [provider]  hydrocortisone (ANUSOL-HC) 2.5 % rectal cream Place rectally 2 (two) times daily. Patient taking differently: Place 1 application rectally  daily as needed for hemorrhoids.  06/23/19  Yes Samuella Cota, MD  lactose free nutrition (BOOST) LIQD Take 237 mLs by mouth 3 (three) times daily between meals.   Yes [provider]  levothyroxine (SYNTHROID, LEVOTHROID) 50 MCG tablet Take 50 mcg by mouth every morning.   Yes [provider]  Multiple Vitamin (MULTIVITAMIN WITH MINERALS) TABS tablet Take 2 tablets by mouth daily.    Yes [provider]  pantoprazole (PROTONIX) 40 MG tablet Take 1 tablet (40 mg total) by mouth daily. 09/17/19 10/17/19 Yes Hongalgi, Lenis Dickinson, MD  Amino Acids-Protein Hydrolys (FEEDING SUPPLEMENT, PRO-STAT SUGAR FREE 64,) LIQD Take 30 mLs by mouth 2 (two) times daily. Patient not taking: Reported on 09/20/2019 09/17/19   Modena Jansky, MD  feeding supplement, ENSURE ENLIVE, (ENSURE ENLIVE) LIQD Take 237 mLs by mouth 2 (two) times daily between meals. Patient not taking: Reported on 09/20/2019 09/17/19   Modena Jansky, MD  potassium chloride 20 MEQ/15ML (10%) SOLN Take 15 mLs (20 mEq total) by mouth daily. Patient not taking: Reported on 06/01/2019 05/04/19 06/12/19  Alla Feeling, NP    Scheduled Meds:  chlorhexidine  15 mL Mouth Rinse BID   Chlorhexidine Gluconate Cloth  6 each Topical Daily   dextromethorphan-guaiFENesin  1 tablet Oral BID   enoxaparin (LOVENOX) injection  40 mg Subcutaneous Q24H   feeding supplement  1 Container Oral BID BM   feeding supplement (PRO-STAT SUGAR FREE 64)  30 mL Per Tube BID   fibrin sealant-human  4 mL Topical Once   insulin aspart  2-6 Units Subcutaneous Q4H   insulin detemir  5 Units Subcutaneous Q12H   levothyroxine  50 mcg Oral Q0600   lidocaine  1 patch Transdermal Q24H   loperamide HCl  4 mg Per Tube QID   mouth rinse  15 mL Mouth Rinse q12n4p   pantoprazole  40 mg Oral Q1200   Infusions:  sodium chloride Stopped (10/03/19 0942)   dextrose 5 % and 0.9 % NaCl with KCl 20 mEq/L 100 mL/hr at 10/19/19 0347    feeding supplement (OSMOLITE 1.5 CAL)     PRN Meds: sodium chloride, diphenhydrAMINE **OR** diphenhydrAMINE, Gerhardt's butt cream, hydrocortisone, HYDROmorphone (DILAUDID) injection, LORazepam, magic mouthwash, methocarbamol, metoprolol tartrate, ondansetron **OR** ondansetron (ZOFRAN) IV, oxyCODONE   Allergies as of 08/09/2019 - Review Complete 08/04/2019  Allergen Reaction Noted   Codeine Nausea Only 02/12/2012    Family History  Problem Relation Age of Onset   Bladder Cancer Brother    Other Other        Denies family h/o cardiac disease   Stomach cancer Maternal Uncle    Throat cancer Maternal Uncle  Social History   Socioeconomic History   Marital status: Divorced    Spouse name: Not on file   Number of children: Not on file   Years of education: Not on file   Highest education level: Not on file  Occupational History   Occupation: retired    Comment: [employer] resource strain: Not on file   Food insecurity    Worry: Not on file    Inability: Not on file   Transportation needs    Medical: No    Non-medical: No  Tobacco Use   Smoking status: Never Smoker   Smokeless tobacco: Never Used  Substance and Sexual Activity   Alcohol use: No   Drug use: No   Sexual activity: Not Currently  Lifestyle   Physical activity    Days per week: Not on file    Minutes per session: Not on file   Stress: Not on file  Relationships   Social connections    Talks on phone: Not on file    Gets together: Not on file    Attends religious service: Not on file    Active member of club or organization: Not on file    Attends meetings of clubs or organizations: Not on file    Relationship status: Not on file   Intimate partner violence    Fear of current or ex partner: Not on file    Emotionally abused: Not on file    Physically abused: Not on file    Forced sexual activity: Not on file  Other Topics Concern   Not on file   Social History Narrative   Lives alone    REVIEW OF SYSTEMS: Constitutional: Feels weak. ENT:  No nose bleeds Pulm: No trouble breathing. CV:  No palpitations, no LE edema.  GU:  No hematuria, no frequency GI: See HPI. Heme: No unusual bleeding or bruising Transfusions: None Neuro:  No headaches, no peripheral tingling or numbness.  No syncope, no seizures. Derm:  No itching, no rash or sores.  Endocrine:  No sweats or chills.  No polyuria or dysuria Immunization: Flu shot received 07/2019. Travel:  None beyond local counties in last few months.    PHYSICAL EXAM: Vital signs in last 24 hours: Vitals:   10/19/19 1035 10/19/19 1238  BP: 131/62 140/82  Pulse: (!) 112 (!) 115  Resp: 20 20  Temp: 98.4 F (36.9 C) 98.8 F (37.1 C)  SpO2: 96% 99%   Wt Readings from Last 3 Encounters:  10/17/19 74.5 kg  09/22/19 59.8 kg  09/12/19 61.2 kg    General: Frail, ill looking, alert, anxious, somewhat uncomfortable looking WF Head: No facial asymmetry or swelling.  No signs of head trauma. Eyes: No scleral icterus.  No conjunctival pallor.  EOMI. Ears: Hard of hearing. Nose: No congestion or discharge.  Grass green fluid bilious output in NG canister. Mouth: Clear, pink oral mucosa. Neck: No JVD, no masses, no thyromegaly. Lungs: No labored breathing or cough.  Lungs clear bilaterally Heart: RRR.  No MRG.  S1, S2 present. Abdomen: Soft but distended.  Slight mild tenderness in the left mid to lower abdomen.  Feeding J-tube positioned on left..   Rectal: Deferred Musc/Skeltl: No gross joint redness, swelling or deformity. Extremities: 1+ pitting edema in the lower extremities with extensive anasarca up into her abdomen. Neurologic: Alert.  Oriented x3.  Moves all 4 limbs, strength not tested. Skin: No rash, no sores, no large  hematomas. Nodes: No cervical adenopathy. Psych: Anxious but cooperative.  Intake/Output from previous day: 11/24 0701 - 11/25 0700 In: 4743.5  [I.V.:4733.5; NG/GT:10] Out: 1100 [Emesis/NG output:1100] Intake/Output this shift: No intake/output data recorded.  LAB RESULTS: Recent Labs    10/17/19 0622 10/18/19 0508  WBC 7.7 8.2  HGB 10.8* 11.1*  HCT 34.1* 36.2  PLT 339 318   BMET Lab Results  Component Value Date   NA 137 10/18/2019   NA 137 10/17/2019   NA 138 10/16/2019   K 3.6 10/18/2019   K 3.9 10/17/2019   K 3.6 10/16/2019   CL 107 10/18/2019   CL 108 10/17/2019   CL 108 10/16/2019   CO2 22 10/18/2019   CO2 23 10/17/2019   CO2 23 10/16/2019   GLUCOSE 157 (H) 10/18/2019   GLUCOSE 100 (H) 10/17/2019   GLUCOSE 97 10/16/2019   BUN 5 (L) 10/18/2019   BUN 5 (L) 10/17/2019   BUN <5 (L) 10/16/2019   CREATININE 0.48 10/18/2019   CREATININE 0.54 10/17/2019   CREATININE 0.51 10/16/2019   CALCIUM 7.9 (L) 10/18/2019   CALCIUM 7.8 (L) 10/17/2019   CALCIUM 7.9 (L) 10/16/2019   LFT Recent Labs    10/17/19 0622 10/18/19 0508  PROT 4.4* 4.6*  ALBUMIN 1.6* 1.6*  AST 12* 15  ALT 12 13  ALKPHOS 120 133*  BILITOT 0.2* 0.5   PT/INR Lab Results  Component Value Date   INR 1.5 (H) 09/28/2019   INR 1.2 10/09/2019   INR 1.5 (H) 10/19/2019   Hepatitis Panel No results for input(s): HEPBSAG, HCVAB, HEPAIGM, HEPBIGM in the last 72 hours. C-Diff No components found for: CDIFF Lipase     Component Value Date/Time   LIPASE 37 09/22/2019 1100    Drugs of Abuse  No results found for: LABOPIA, COCAINSCRNUR, LABBENZ, AMPHETMU, THCU, LABBARB   RADIOLOGY STUDIES: Dg Abd Portable 1v  Result Date: 10/17/2019 CLINICAL DATA:  73 year old female status post NG tube placement. EXAM: PORTABLE ABDOMEN - 1 VIEW COMPARISON:  Radiograph dated 10/11/2019. FINDINGS: Enteric tube with tip in the distal stomach. No bowel dilatation or evidence of obstruction. Excreted contrast noted in the renal collecting system and bladder. Degenerative changes of the spine. No acute osseous pathology. IMPRESSION: Enteric tube with tip in the  distal stomach. Electronically Signed   By: Anner Crete M.D.   On: 10/17/2019 16:05     IMPRESSION:   *    3 weeks post Whipple procedure for pancreatic cancer.  Delayed gastric emptying versus GJ swelling with partial obstruction.  Bilious output from NGT for ~ 1 week ? patency of GJ afferent limb.    *    Frequent, diarrheal stools.  Dr. Barry Dienes has ordered 4 times daily Imodium C. difficile negative but that was in May and June 2020.  Will order stools to recheck for C. Difficile.  Suspect diarrhea is from tube feedings and bowel edema/malabsorption  associated with malnutrition.   PLAN:     *   Dr Fuller Plan will see pt.  Dr Collene Mares on call for tmrw, Dr Fuller Plan resumes care on 11/27.   EGD on Friday 11/27.  *   Stool for C diff.    *   Given findings on previous EGDs of severe esophagitis, I switched out enteric Protonix for Protonix 40 IV bid.   Addendum at 330 PM: Called by RN to look at pt's stool, which is about 20 CC of watery, pnk/bloody stool.   This is new, BM's  earlier today were brown liquid/watery.     Azucena Freed  10/19/2019, 2:26 PM Phone [phone]    Attending Physician Note   I have taken a history, examined the patient and reviewed the chart. I agree with the Advanced Practitioner's note, impression and recommendations.   Delayed gastric emptying 3 weeks post Whipple for pancreatic cancer Diarrhea, R/O C diff Severe esophagitis previously diagnosed  EGD on Friday Stool for C diff Change pantoprazole to IV for now  Lucio Edward, MD [facility]

## 2019-10-19 NOTE — Progress Notes (Signed)
     Subjective:     Having some distention and did not get her "medicine not to poop all the time."    Objective:  Vital signs for last 24 hours: Temp:  [98 F (36.7 C)-99.3 F (37.4 C)] 98 F (36.7 C) (11/25 0401) Pulse Rate:  [105-114] 107 (11/25 0401) Resp:  [17-18] 18 (11/25 0401) BP: (133-147)/(58-73) 147/73 (11/25 0401) SpO2:  [96 %-97 %] 97 % (11/25 0401)  Intake/Output from previous day: 11/24 0701 - 11/25 0700 In: 4743.5 [I.V.:4733.5; NG/GT:10] Out: 1100 [Emesis/NG output:1100]  Intake/Output this shift: No intake/output data recorded.  Vent settings for last 24 hours:    Physical Exam:  Gen: uncomfortable on bedpan.   Neuro: non-focal exam HEENT: NGT in place, bilious output Neck: supple CV: RRR Pulm: unlabored breathing Abd: soft, NT, sl distended Extr: wwp   Results for orders placed or performed during the hospital encounter of 10/15/2019 (from the past 24 hour(s))  Glucose, capillary     Status: Abnormal   Collection Time: 10/18/19 12:38 PM  Result Value Ref Range   Glucose-Capillary 119 (H) 70 - 99 mg/dL   Comment 1 Notify RN    Comment 2 Document in Chart     Assessment & Plan: Present on Admission: . Pancreatic cancer ([place]) . 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    74F s/p Whipple  Pancreatic cancer - s/p Whipple by Dr. Barry Dienes 11/3 ABL anemia - stable Presumed delayed gastric emptying - continue NGT to suction. May need venting G-tube. Will discuss methods of G tube placement.  Stomach is close to abdominal wall.  FEN - continue TF via feeding J-tube. Consistency of BM is not an indication to hold TF. Increase to goal.   Repleted mag and phos. Loperamide to slow GI transit.  DVT - LMWH, PT/OT Severe protein calorie malnutrition.    Milus Height, MD FACS Surgical Oncology, General Surgery, Trauma and [place]  [facility], [place] [phone] Check [email or web address] for coverage night/weekend   10/19/2019  *Care during the described time interval was provided by me. I have reviewed this patient's available data, including medical history, events of note, physical examination and test results as part of my evaluation.

## 2019-10-19 NOTE — Progress Notes (Signed)
Paged Dr white in regards to patients HR 120s

## 2019-10-20 ENCOUNTER — Inpatient Hospital Stay (HOSPITAL_COMMUNITY): Payer: Medicare HMO

## 2019-10-20 LAB — GLUCOSE, CAPILLARY
Glucose-Capillary: 113 mg/dL — ABNORMAL HIGH (ref 70–99)
Glucose-Capillary: 114 mg/dL — ABNORMAL HIGH (ref 70–99)
Glucose-Capillary: 128 mg/dL — ABNORMAL HIGH (ref 70–99)
Glucose-Capillary: 136 mg/dL — ABNORMAL HIGH (ref 70–99)
Glucose-Capillary: 93 mg/dL (ref 70–99)

## 2019-10-20 NOTE — Progress Notes (Signed)
While administering patient pain medication this nurse noted patient coughing up bile. Paged Dr white for ABD xray ngt confirmation.

## 2019-10-20 NOTE — Anesthesia Preprocedure Evaluation (Addendum)
Anesthesia Evaluation  Patient identified by MRN, date of birth, ID band Patient awake    Reviewed: Allergy & Precautions, NPO status , Patient's Chart, lab work & pertinent test results  Airway Mallampati: II  TM Distance: >3 FB Neck ROM: Full    Dental no notable dental hx. (+) Partial Lower   Pulmonary neg pulmonary ROS,    Pulmonary exam normal breath sounds clear to auscultation       Cardiovascular hypertension, Pt. on medications Normal cardiovascular exam Rhythm:Regular Rate:Normal  09/15/19 Echo  Ef 60-65%   Neuro/Psych Anxiety negative neurological ROS     GI/Hepatic negative GI ROS, Neg liver ROS, pancreatic ca   Endo/Other  Hypothyroidism   Renal/GU negative Renal ROS     Musculoskeletal negative musculoskeletal ROS (+)   Abdominal   Peds  Hematology negative hematology ROS (+)   Anesthesia Other Findings   Reproductive/Obstetrics negative OB ROS                            Anesthesia Physical Anesthesia Plan  ASA: III  Anesthesia Plan: MAC   Post-op Pain Management:    Induction: Intravenous  PONV Risk Score and Plan: Treatment may vary due to age or medical condition  Airway Management Planned: Nasal Cannula and Natural Airway  Additional Equipment:   Intra-op Plan:   Post-operative Plan:   Informed Consent: I have reviewed the patients History and Physical, chart, labs and discussed the procedure including the risks, benefits and alternatives for the proposed anesthesia with the patient or authorized representative who has indicated his/her understanding and acceptance.     Dental advisory given  Plan Discussed with:   Anesthesia Plan Comments: (Egd for SBO post whipple)       Anesthesia Quick Evaluation

## 2019-10-20 NOTE — Progress Notes (Signed)
     Subjective:     Had some issues with spitting up bile again.    Objective:  Vital signs for last 24 hours: Temp:  [97.7 F (36.5 C)-98.8 F (37.1 C)] 98.2 F (36.8 C) (11/26 0455) Pulse Rate:  [94-121] 94 (11/26 0455) Resp:  [18-20] 18 (11/26 0455) BP: (128-140)/(57-82) 128/69 (11/26 0455) SpO2:  [95 %-99 %] 97 % (11/26 0455)  Intake/Output from previous day: 11/25 0701 - 11/26 0700 In: 200 [P.O.:200] Out: 600 [Emesis/NG output:600]  Intake/Output this shift: No intake/output data recorded.  Vent settings for last 24 hours:    Physical Exam:  Gen: sleeping, arousable.   Neuro: non-focal exam HEENT: NGT in place, bilious output Neck: supple Pulm: unlabored breathing Abd: soft, NT, sl distended.  More anasarca.   Extr: wwp   Results for orders placed or performed during the hospital encounter of 10/09/2019 (from the past 24 hour(s))  Glucose, capillary     Status: None   Collection Time: 10/19/19 12:01 PM  Result Value Ref Range   Glucose-Capillary 99 70 - 99 mg/dL  Glucose, capillary     Status: None   Collection Time: 10/19/19  4:32 PM  Result Value Ref Range   Glucose-Capillary 79 70 - 99 mg/dL  Glucose, capillary     Status: Abnormal   Collection Time: 10/19/19  7:40 PM  Result Value Ref Range   Glucose-Capillary 110 (H) 70 - 99 mg/dL  Glucose, capillary     Status: Abnormal   Collection Time: 10/20/19 12:08 AM  Result Value Ref Range   Glucose-Capillary 113 (H) 70 - 99 mg/dL  Glucose, capillary     Status: Abnormal   Collection Time: 10/20/19  4:59 AM  Result Value Ref Range   Glucose-Capillary 136 (H) 70 - 99 mg/dL  Glucose, capillary     Status: Abnormal   Collection Time: 10/20/19  8:08 AM  Result Value Ref Range   Glucose-Capillary 114 (H) 70 - 99 mg/dL   Comment 1 Notify RN    Comment 2 Document in Chart     Assessment & Plan: Present on Admission: . Pancreatic cancer ([place]) . 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    89F s/p Whipple  Pancreatic cancer - s/p Whipple by Dr. Barry Dienes 11/3 ABL anemia - stable Presumed delayed gastric emptying - continue NGT to suction. May need venting G-tube. Will discuss methods of G tube placement.  Stomach is close to abdominal wall.  FEN - continue TF via feeding J-tube.  Increase to goal.   Repleted mag and phos. Recheck those tomorrow as well.   Loperamide qid to slow GI transit.  DVT - LMWH, PT/OT Severe protein calorie malnutrition.  Recheck prealbumin next week.   kvo iv fluids.    Milus Height, MD FACS Surgical Oncology, General Surgery, Trauma and [facility], [place] [phone] Check [email or web address] for coverage night/weekend   10/20/2019  *Care during the described time interval was provided by me. I have reviewed this patient's available data, including medical history, events of note, physical examination and test results as part of my evaluation.

## 2019-10-21 ENCOUNTER — Encounter (HOSPITAL_COMMUNITY): Admission: RE | Disposition: E | Payer: Self-pay | Source: Home / Self Care | Attending: General Surgery

## 2019-10-21 ENCOUNTER — Inpatient Hospital Stay (HOSPITAL_COMMUNITY): Payer: Medicare HMO | Admitting: Anesthesiology

## 2019-10-21 ENCOUNTER — Encounter (HOSPITAL_COMMUNITY): Payer: Self-pay | Admitting: Gastroenterology

## 2019-10-21 DIAGNOSIS — K209 Esophagitis, unspecified without bleeding: Secondary | ICD-10-CM

## 2019-10-21 DIAGNOSIS — K311 Adult hypertrophic pyloric stenosis: Secondary | ICD-10-CM | POA: Diagnosis not present

## 2019-10-21 HISTORY — PX: BIOPSY: SHX5522

## 2019-10-21 HISTORY — PX: ESOPHAGOGASTRODUODENOSCOPY (EGD) WITH PROPOFOL: SHX5813

## 2019-10-21 LAB — CBC
HCT: 34.1 % — ABNORMAL LOW (ref 36.0–46.0)
Hemoglobin: 10.6 g/dL — ABNORMAL LOW (ref 12.0–15.0)
MCH: 31.1 pg (ref 26.0–34.0)
MCHC: 31.1 g/dL (ref 30.0–36.0)
MCV: 100 fL (ref 80.0–100.0)
Platelets: 281 10*3/uL (ref 150–400)
RBC: 3.41 MIL/uL — ABNORMAL LOW (ref 3.87–5.11)
RDW: 14.7 % (ref 11.5–15.5)
WBC: 15.9 10*3/uL — ABNORMAL HIGH (ref 4.0–10.5)
nRBC: 0 % (ref 0.0–0.2)

## 2019-10-21 LAB — BASIC METABOLIC PANEL
Anion gap: 8 (ref 5–15)
BUN: 9 mg/dL (ref 8–23)
CO2: 23 mmol/L (ref 22–32)
Calcium: 8 mg/dL — ABNORMAL LOW (ref 8.9–10.3)
Chloride: 107 mmol/L (ref 98–111)
Creatinine, Ser: 0.53 mg/dL (ref 0.44–1.00)
GFR calc Af Amer: >60 mL/min (ref >60)
GFR calc non Af Amer: >60 mL/min (ref >60)
Glucose, Bld: 126 mg/dL — ABNORMAL HIGH (ref 70–99)
Potassium: 4 mmol/L (ref 3.5–5.1)
Sodium: 138 mmol/L (ref 135–145)

## 2019-10-21 LAB — GLUCOSE, CAPILLARY
Glucose-Capillary: 118 mg/dL — ABNORMAL HIGH (ref 70–99)
Glucose-Capillary: 120 mg/dL — ABNORMAL HIGH (ref 70–99)
Glucose-Capillary: 121 mg/dL — ABNORMAL HIGH (ref 70–99)
Glucose-Capillary: 126 mg/dL — ABNORMAL HIGH (ref 70–99)
Glucose-Capillary: 127 mg/dL — ABNORMAL HIGH (ref 70–99)
Glucose-Capillary: 61 mg/dL — ABNORMAL LOW (ref 70–99)
Glucose-Capillary: 86 mg/dL (ref 70–99)
Glucose-Capillary: 98 mg/dL (ref 70–99)

## 2019-10-21 LAB — MAGNESIUM: Magnesium: 1.7 mg/dL (ref 1.7–2.4)

## 2019-10-21 LAB — PHOSPHORUS: Phosphorus: 3.3 mg/dL (ref 2.5–4.6)

## 2019-10-21 SURGERY — ESOPHAGOGASTRODUODENOSCOPY (EGD) WITH PROPOFOL
Anesthesia: Monitor Anesthesia Care

## 2019-10-21 MED ORDER — DEXTROSE 50 % IV SOLN
50.0000 mL | Freq: Once | INTRAVENOUS | Status: AC
  Administered 2019-10-21: 50 mL via INTRAVENOUS

## 2019-10-21 MED ORDER — PROPOFOL 10 MG/ML IV BOLUS
INTRAVENOUS | Status: DC | PRN
  Administered 2019-10-21: 40 mg via INTRAVENOUS

## 2019-10-21 MED ORDER — DEXTROSE-NACL 5-0.9 % IV SOLN
INTRAVENOUS | Status: DC
  Administered 2019-10-21 – 2019-10-23 (×3): via INTRAVENOUS

## 2019-10-21 MED ORDER — PROPOFOL 500 MG/50ML IV EMUL
INTRAVENOUS | Status: DC | PRN
  Administered 2019-10-21: 100 ug/kg/min via INTRAVENOUS

## 2019-10-21 MED ORDER — LIDOCAINE 2% (20 MG/ML) 5 ML SYRINGE
INTRAMUSCULAR | Status: DC | PRN
  Administered 2019-10-21: 100 mg via INTRAVENOUS

## 2019-10-21 MED ORDER — DEXTROSE 50 % IV SOLN
INTRAVENOUS | Status: AC
  Filled 2019-10-21: qty 50

## 2019-10-21 MED ORDER — SODIUM CHLORIDE 0.9 % IV SOLN
INTRAVENOUS | Status: DC | PRN
  Administered 2019-10-21: 13:00:00 via INTRAVENOUS

## 2019-10-21 MED ORDER — LACTATED RINGERS IV SOLN
INTRAVENOUS | Status: DC | PRN
  Administered 2019-10-21: 13:00:00 via INTRAVENOUS

## 2019-10-21 SURGICAL SUPPLY — 15 items

## 2019-10-21 NOTE — Progress Notes (Signed)
CRITICAL VALUE ALERT  Critical Value:  82  Date & Time Notied:  09/25/2019 at 1  Provider Notified: Dr. Darene Lamer. Corn[place]  Orders Received/Actions taken: given d50-50 rechecked cbg 126, pt Alert and oriented x3 ( pt was npo before EGD and resume tube feeding after the procedure)

## 2019-10-21 NOTE — Anesthesia Postprocedure Evaluation (Signed)
Anesthesia Post Note  Patient: CAYLEI SPERRY  Procedure(s) Performed: ESOPHAGOGASTRODUODENOSCOPY (EGD) WITH PROPOFOL (N/A ) BIOPSY     Patient location during evaluation: PACU Anesthesia Type: MAC Level of consciousness: awake and alert Pain management: pain level controlled Vital Signs Assessment: post-procedure vital signs reviewed and stable Respiratory status: spontaneous breathing, nonlabored ventilation, respiratory function stable and patient connected to nasal cannula oxygen Cardiovascular status: stable and blood pressure returned to baseline Postop Assessment: no apparent nausea or vomiting Anesthetic complications: no    Last Vitals:  Vitals:   10/16/2019 1425 10/16/2019 1435  BP: 139/65 (!) 141/65  Pulse: (!) 101 (!) 106  Resp: 20 (!) 22  Temp:    SpO2: 98% 98%    Last Pain:  Vitals:   10/17/2019 1354  TempSrc: Temporal  PainSc: 0-No pain                 Effie Berkshire

## 2019-10-21 NOTE — Progress Notes (Signed)
Pt co/o of her tube feeding is leaking, reassessed pt and this RN noticed that the leak is coming from her incision. ABD pad applied. Will endorse appropriately.

## 2019-10-21 NOTE — Progress Notes (Signed)
Pt back from EGD, alert and oriented with 02 [place] at 2l.

## 2019-10-21 NOTE — Progress Notes (Signed)
PT Cancellation Note  Patient Details Name: Julie Rollins MRN: [id number] DOB: 26-Apr-1946   Cancelled Treatment:    Reason Eval/Treat Not Completed: Patient at procedure or test/unavailable. Pt undergoing EGD. Will check back as time allows.   Benjiman Core, PTA Pager [phone] Acute Rehab   Allena Katz 09/25/2019, 2:08 PM

## 2019-10-21 NOTE — Op Note (Signed)
[facility] Patient Name: Julie Rollins Procedure Date : 10/13/2019 MRN: [id number] Attending MD: Ladene Artist , MD Date of Birth: 08-May-1946 CSN: [id number] Age: 73 Admit Type: Inpatient Procedure:                Upper GI endoscopy Indications:              Exclusion of post-surgical anastomotic stenosis Providers:                Pricilla Riffle. Fuller Plan, MD, Benay Pillow, RN, Grace Isaac, RN, Lazaro Arms, Technician Referring MD:             Faith Branan Klein, MD Medicines:                Monitored Anesthesia Care Complications:            No immediate complications. Estimated Blood Loss:     Estimated blood loss was minimal. Procedure:                Pre-Anesthesia Assessment:                           - Prior to the procedure, a History and Physical                            was performed, and patient medications and                            allergies were reviewed. The patient's tolerance of                            previous anesthesia was also reviewed. The risks                            and benefits of the procedure and the sedation                            options and risks were discussed with the patient.                            All questions were answered, and informed consent                            was obtained. Prior Anticoagulants: The patient has                            taken no previous anticoagulant or antiplatelet                            agents. ASA Grade Assessment: III - A patient with                            severe systemic disease. After reviewing the risks  and benefits, the patient was deemed in                            satisfactory condition to undergo the procedure.                           After obtaining informed consent, the endoscope was                            passed under direct vision. Throughout the                            procedure, the patient's blood  pressure, pulse, and                            oxygen saturations were monitored continuously. The                            [id number]) Olympus gastroscope was                            introduced through the mouth, and advanced to the                            efferent jejunal loop. The upper GI endoscopy was                            somewhat difficult due to post-surgical anatomy.                            The patient tolerated the procedure well. Scope In: Scope Out: Findings:      LA Grade C (one or more mucosal breaks continuous between tops of 2 or       more mucosal folds, less than 75% circumference) esophagitis with no       bleeding was found in the mid and distal esophagus. Green exudates       noted, suspected bile staining. Biopsies were taken with a cold forceps       for histology.      The proximal esophagus was normal. NG in entire esophagus.      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 The       gastrojejunal anastomosis was characterized by congestion and edema.       This was traversed after navigating the short, stenosed, edematous       segment at the anastomosis. The efferent limb was examined 20 cm from       the anastomosis and was otherwise characterized by healthy appearing       mucosa. The afferent limb was not examined as it could not be found.      The exam of the stomach was otherwise normal. NG tube in stomach.       Gastrostomy was not planned today however I attempted to located window       for potential PEG placement. Not able to transilluminate or obtain 1:1       motion with external abdominal wall pressure. Impression:               -  LA Grade C esophagitis with no bleeding. Biopsied.                           - Normal proximal esophagus.                           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 Efferent                             limb located with edematous short stenosis at                            anastomosis. Unable to locate afferent limb.                           - NG tube in place.                           - No window available to allow PEG placement. Recommendation:           - Return patient to hospital ward for ongoing care.                           - Continue present medications.                           - Await pathology results. Procedure Code(s):        --- Professional ---                           [phone], Esophagogastroduodenoscopy, flexible,                            transoral; with biopsy, single or multiple Diagnosis Code(s):        --- Professional ---                           K20.90, Esophagitis, unspecified without bleeding                           Z98.0, Intestinal bypass and anastomosis status CPT copyright 2019 [employer]. All rights reserved. The codes documented in this report are preliminary and upon coder review may  be revised to meet current compliance requirements. Ladene Artist, MD 09/28/2019 2:00:01 PM This report has been signed electronically. Number of Addenda: 0

## 2019-10-21 NOTE — Interval H&P Note (Signed)
History and Physical Interval Note:  09/28/2019 1:16 PM  Julie Rollins  has presented today for surgery, with the diagnosis of gastric retention post whipples.  The various methods of treatment have been discussed with the patient and family. After consideration of risks, benefits and other options for treatment, the patient has consented to  Procedure(s): ESOPHAGOGASTRODUODENOSCOPY (EGD) WITH PROPOFOL (N/A) as a surgical intervention.  The patient's history has been reviewed, patient examined, no change in status, stable for surgery.  I have reviewed the patient's chart and labs.  Questions were answered to the patient's satisfaction.     Pricilla Riffle. Fuller Plan

## 2019-10-21 NOTE — Progress Notes (Signed)
Pt for EGD with consent, NPO at 0730, alert and oriented, CHG done.

## 2019-10-21 NOTE — Progress Notes (Signed)
Pt transfer to endo for EGD.

## 2019-10-21 NOTE — Progress Notes (Signed)
Subjective:     Had some drainage from midline wound.    Objective:  Vital signs for last 24 hours: Temp:  [97.6 F (36.4 C)-98.3 F (36.8 C)] 97.6 F (36.4 C) (11/27 0429) Pulse Rate:  [109-110] 109 (11/27 0429) Resp:  [17-18] 17 (11/27 0429) BP: (108-126)/(60-63) 108/60 (11/27 0429) SpO2:  [91 %-93 %] 93 % (11/27 0429)  Intake/Output from previous day: 11/26 0701 - 11/27 0700 In: 1121.7 [NG/GT:1121.7] Out: 400 [Urine:200; Emesis/NG output:100; Drains:100]  Intake/Output this shift: No intake/output data recorded.  Vent settings for last 24 hours:    Physical Exam:  Gen: awake, NAD Neuro: non-focal exam HEENT: NGT in place, bilious output Neck: supple Pulm: unlabored breathing Abd: soft, NT, sl distended.  More anasarca.  Some redness at center of midline incision.  Probed and clear yellow fluid came out.   Extr: wwp   Results for orders placed or performed during the hospital encounter of 10/14/2019 (from the past 24 hour(s))  Glucose, capillary     Status: Abnormal   Collection Time: 10/20/19 11:47 AM  Result Value Ref Range   Glucose-Capillary 128 (H) 70 - 99 mg/dL   Comment 1 Notify RN    Comment 2 Document in Chart   Glucose, capillary     Status: None   Collection Time: 10/20/19  4:13 PM  Result Value Ref Range   Glucose-Capillary 93 70 - 99 mg/dL   Comment 1 Notify RN    Comment 2 Document in Chart   Glucose, capillary     Status: Abnormal   Collection Time: 10/20/19  8:36 PM  Result Value Ref Range   Glucose-Capillary 118 (H) 70 - 99 mg/dL   Comment 1 Notify RN    Comment 2 Document in Chart   Glucose, capillary     Status: Abnormal   Collection Time: 10/07/2019 12:13 AM  Result Value Ref Range   Glucose-Capillary 121 (H) 70 - 99 mg/dL  CBC     Status: Abnormal   Collection Time: 10/11/2019  4:24 AM  Result Value Ref Range   WBC 15.9 (H) 4.0 - 10.5 K/uL   RBC 3.41 (L) 3.87 - 5.11 MIL/uL   Hemoglobin 10.6 (L) 12.0 - 15.0 g/dL   HCT 34.1 (L)  36.0 - 46.0 %   MCV 100.0 80.0 - 100.0 fL   MCH 31.1 26.0 - 34.0 pg   MCHC 31.1 30.0 - 36.0 g/dL   RDW 14.7 11.5 - 15.5 %   Platelets 281 150 - 400 K/uL   nRBC 0.0 0.0 - 0.2 %  Basic metabolic panel     Status: Abnormal   Collection Time: 10/14/2019  4:24 AM  Result Value Ref Range   Sodium 138 135 - 145 mmol/L   Potassium 4.0 3.5 - 5.1 mmol/L   Chloride 107 98 - 111 mmol/L   CO2 23 22 - 32 mmol/L   Glucose, Bld 126 (H) 70 - 99 mg/dL   BUN 9 8 - 23 mg/dL   Creatinine, Ser 0.53 0.44 - 1.00 mg/dL   Calcium 8.0 (L) 8.9 - 10.3 mg/dL   GFR calc non Af Amer >60 >60 mL/min   GFR calc Af Amer >60 >60 mL/min   Anion gap 8 5 - 15  Magnesium     Status: None   Collection Time: 09/26/2019  4:24 AM  Result Value Ref Range   Magnesium 1.7 1.7 - 2.4 mg/dL  Phosphorus     Status: None  Collection Time: 10/17/2019  4:24 AM  Result Value Ref Range   Phosphorus 3.3 2.5 - 4.6 mg/dL  Glucose, capillary     Status: Abnormal   Collection Time: 10/20/2019  4:27 AM  Result Value Ref Range   Glucose-Capillary 127 (H) 70 - 99 mg/dL  Glucose, capillary     Status: None   Collection Time: 10/06/2019  8:07 AM  Result Value Ref Range   Glucose-Capillary 98 70 - 99 mg/dL    Assessment & Plan: Present on Admission: . Pancreatic cancer ([place]) . Adenocarcinoma of head of pancreas ([place])    LOS: 24 days   Additional comments:I reviewed the patient's new clinical lab test results.    44F s/p Whipple  Pancreatic cancer - s/p Whipple by Dr. Barry Dienes 11/3 ABL anemia - stable Presumed delayed gastric emptying - continue NGT to suction. May need venting G-tube. Stomach is close to abdominal wall. GI plans EGD today.  Hopefully they can get into the efferent limb of the GJ.   FEN - continue TF via feeding J-tube.  Increase to goal.  Mg, phos, K ok today.    Loperamide qid to slow GI transit.  DVT - LMWH, PT/OT Severe protein calorie malnutrition.  Recheck prealbumin next week.   kvo iv fluids.    Milus Height,  MD FACS Surgical Oncology, General Surgery, Trauma and [place] [facility], [place] [phone] Check [email or web address] for coverage night/weekend   10/03/2019  *Care during the described time interval was provided by me. I have reviewed this patient's available data, including medical history, events of note, physical examination and test results as part of my evaluation.

## 2019-10-21 NOTE — Transfer of Care (Signed)
Immediate Anesthesia Transfer of Care Note  Patient: MELADEE COPPINS  Procedure(s) Performed: ESOPHAGOGASTRODUODENOSCOPY (EGD) WITH PROPOFOL (N/A ) BIOPSY  Patient Location: Endoscopy Unit  Anesthesia Type:General  Level of Consciousness: awake, alert  and oriented  Airway & Oxygen Therapy: Patient Spontanous Breathing and Patient connected to face mask oxygen  Post-op Assessment: Report given to RN, Post -op Vital signs reviewed and stable and Patient moving all extremities  Post vital signs: Reviewed and stable  Last Vitals:  Vitals Value Taken Time  BP 136/56 09/30/2019 1415  Temp 36.6 C 10/09/2019 1354  Pulse 101 10/24/2019 1419  Resp 18 10/07/2019 1419  SpO2 97 % 10/16/2019 1419  Vitals shown include unvalidated device data.  Last Pain:  Vitals:   10/17/2019 1354  TempSrc: Temporal  PainSc: 0-No pain      Patients Stated Pain Goal: 2 ([id number])  Complications: No apparent anesthesia complications

## 2019-10-22 LAB — GLUCOSE, CAPILLARY
Glucose-Capillary: 105 mg/dL — ABNORMAL HIGH (ref 70–99)
Glucose-Capillary: 74 mg/dL (ref 70–99)
Glucose-Capillary: 79 mg/dL (ref 70–99)
Glucose-Capillary: 86 mg/dL (ref 70–99)
Glucose-Capillary: 89 mg/dL (ref 70–99)
Glucose-Capillary: 91 mg/dL (ref 70–99)
Glucose-Capillary: 98 mg/dL (ref 70–99)

## 2019-10-22 MED ORDER — PRO-STAT SUGAR FREE PO LIQD
30.0000 mL | Freq: Every day | ORAL | Status: DC
  Administered 2019-10-22 – 2019-10-25 (×4): 30 mL
  Filled 2019-10-22 (×4): qty 30

## 2019-10-22 MED ORDER — VITAL AF 1.2 CAL PO LIQD
1000.0000 mL | ORAL | Status: DC
  Administered 2019-10-22 – 2019-10-23 (×2): 1000 mL
  Filled 2019-10-22 (×2): qty 1000

## 2019-10-22 MED ORDER — VITAL AF 1.2 CAL PO LIQD
1000.0000 mL | ORAL | Status: DC
  Filled 2019-10-22: qty 1000

## 2019-10-22 MED ORDER — METHOCARBAMOL 500 MG PO TABS
500.0000 mg | ORAL_TABLET | Freq: Three times a day (TID) | ORAL | Status: DC | PRN
  Administered 2019-10-22 – 2019-11-04 (×13): 500 mg
  Filled 2019-10-22 (×13): qty 1

## 2019-10-22 MED ORDER — VITAL 1.5 CAL PO LIQD
1000.0000 mL | ORAL | Status: DC
  Filled 2019-10-22: qty 1000

## 2019-10-22 NOTE — Progress Notes (Addendum)
Nutrition Follow-up  RD working remotely.  DOCUMENTATION CODES:   Not applicable  INTERVENTION:   -D/c Boost Breeze -D/c Osmolite 1.5 -Initiate Vital 1.5 @ 55 ml/hr via j-tube  30 ml Prostat daily    Tube feeding regimen provides 2080 kcal (100% of needs), 104 grams of protein, and 1008 ml of H2O.   NUTRITION DIAGNOSIS:   Increased nutrient needs related to cancer and cancer related treatments as evidenced by estimated needs.  Ongoing  GOAL:   Patient will meet greater than or equal to 90% of their needs  Progressing   MONITOR:   Diet advancement, Skin, TF tolerance, Weight trends, Labs, I & O's  REASON FOR ASSESSMENT:   Consult Enteral/tube feeding initiation and management  ASSESSMENT:   Pt with PMH of breast cancer s/p lumpectomy and XRT, hypothyroidism, HTN, dx with pancreatic cancer May 2020 s/p ERCP with stent, chemo (complicated by nausea, weight loss, duodenal obstruction requiring TPN) now admitted for pancreaticoduodenectomy with cholecystectomy with removal of 4 cm from pancreatic head.  Procedure(11/3): WHIPPLE PROCEDURE (Abdomen) GASTROJEJUNOSTOMY, J TUBE (Abdomen) for adenocarcinoma of the pancreas, s/p neoadjuvant chemo and neoadjuvant SBRT 11/17- NGT inserted 11/19- TF restarted 11/27- s/p upper GI endoscopy- revealed esophagitis (biopsied) and no window for PEG placement  Reviewed I/O's: +837 ml x 24 hours and +8.7 L since 10/07/28  UOP: 175 ml x 24 hours  NGT output: 220 ml x 24 hours  Stool output: 50 ml x 24 hours  MD requesting change TF formula to Vital, as pt with continued diarrhea despite max dose of imodium. Pt with hypoglycemic event likely due to TF off from EGD.   Medications reviewed and include dextrose 5%-0.9% sodium chloride infusion @ 50 ml/hr.   Labs reviewed: CBGS: 86-126 (inpatient orders for glycemic control are 2-6 unit insulin aspart every 4 hours and 5 units insulin detemir BID).   Diet Order:   Diet Order             Diet NPO time specified Except for: Ice Chips, Sips with Meds  Diet effective now              EDUCATION NEEDS:   No education needs have been identified at this time  Skin:  Skin Assessment: Skin Integrity Issues: Skin Integrity Issues:: Incisions Incisions: abdomen  Last BM:  10/20/19  Height:   Ht Readings from Last 1 Encounters:  10/17/2019 5\' 4"  (1.626 m)    Weight:   Wt Readings from Last 1 Encounters:  10/22/19 77.1 kg    Ideal Body Weight:  54.5 kg  BMI:  Body mass index is 29.18 kg/m.  Estimated Nutritional Needs:   Kcal:  2000-2200  Protein:  100-115 grams  Fluid:  2L/day    Alona Danford A. Jimmye Norman, RD, LDN, [place] Registered Dietitian II Certified Diabetes Care and Education Specialist Pager: [phone] After hours Pager: [phone]

## 2019-10-22 NOTE — Progress Notes (Signed)
     Subjective:     EGD yesterday showed obstruction due to swelling to at least one limb of the GJ.  No window for PEG.    Objective:  Vital signs for last 24 hours: Temp:  [97.6 F (36.4 C)-98.3 F (36.8 C)] 98.3 F (36.8 C) (11/28 0457) Pulse Rate:  [99-127] 99 (11/28 0457) Resp:  [15-25] 15 (11/28 0457) BP: (114-148)/(49-67) 114/57 (11/28 0457) SpO2:  [91 %-98 %] 94 % (11/28 0457) Weight:  [74.5 kg-77.1 kg] 77.1 kg (11/28 0500)  Intake/Output from previous day: 11/27 0701 - 11/28 0700 In: 1282.4 [P.O.:60; I.V.:1102.4] Out: 445 [Urine:175; Emesis/NG output:220; Stool:50]  Intake/Output this shift: No intake/output data recorded.  Vent settings for last 24 hours:    Physical Exam:  Gen: awake, NAD Neuro: no acute issues.   HEENT: NGT in place, bilious output Pulm: unlabored breathing Abd: soft, NT, sl distended.  Anasarca.  Some clear yellow drainage from wound.  Redness improved.   Extr: wwp   Results for orders placed or performed during the hospital encounter of 10/03/2019 (from the past 24 hour(s))  Glucose, capillary     Status: None   Collection Time: 09/28/2019 11:34 AM  Result Value Ref Range   Glucose-Capillary 86 70 - 99 mg/dL  Glucose, capillary     Status: Abnormal   Collection Time: 10/19/2019  4:48 PM  Result Value Ref Range   Glucose-Capillary 61 (L) 70 - 99 mg/dL  Glucose, capillary     Status: Abnormal   Collection Time: 10/05/2019  5:14 PM  Result Value Ref Range   Glucose-Capillary 126 (H) 70 - 99 mg/dL  Glucose, capillary     Status: Abnormal   Collection Time: 10/07/2019  8:11 PM  Result Value Ref Range   Glucose-Capillary 120 (H) 70 - 99 mg/dL  Glucose, capillary     Status: None   Collection Time: 09/30/2019 11:58 PM  Result Value Ref Range   Glucose-Capillary 98 70 - 99 mg/dL  Glucose, capillary     Status: None   Collection Time: 10/22/19  4:51 AM  Result Value Ref Range   Glucose-Capillary 86 70 - 99 mg/dL  Glucose, capillary      Status: None   Collection Time: 10/22/19  7:40 AM  Result Value Ref Range   Glucose-Capillary 74 70 - 99 mg/dL    Assessment & Plan: Present on Admission:  . 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    6F s/p Whipple  Pancreatic cancer - s/p Whipple by Dr. Barry Dienes 11/3 ABL anemia - stable Presumed delayed gastric emptying vs obstruction due to swelling at [place]- continue NGT to suction. May need venting G-tube. Stomach is close to abdominal wall. Will give a little more time.  May need surgical G tube if no other options are available.   FEN - switch to vital TF formula as diarrhea continued despite max imodium.   Endo - had some hypoglycemia last night.  Likely due to no tube feeds and no IV fluids.   DVT - LMWH, PT/OT Severe protein calorie malnutrition.  Recheck prealbumin tomorrow.     Milus Height, MD FACS Surgical Oncology, General Surgery, Trauma and [place] [facility], [place] [phone] Check [email or web address] for coverage night/weekend   10/22/2019

## 2019-10-23 ENCOUNTER — Encounter (HOSPITAL_COMMUNITY): Payer: Self-pay | Admitting: Gastroenterology

## 2019-10-23 LAB — COMPREHENSIVE METABOLIC PANEL
ALT: 12 U/L (ref 0–44)
AST: 14 U/L — ABNORMAL LOW (ref 15–41)
Albumin: 1.4 g/dL — ABNORMAL LOW (ref 3.5–5.0)
Alkaline Phosphatase: 139 U/L — ABNORMAL HIGH (ref 38–126)
Anion gap: 9 (ref 5–15)
BUN: 10 mg/dL (ref 8–23)
CO2: 25 mmol/L (ref 22–32)
Calcium: 8 mg/dL — ABNORMAL LOW (ref 8.9–10.3)
Chloride: 106 mmol/L (ref 98–111)
Creatinine, Ser: 0.47 mg/dL (ref 0.44–1.00)
GFR calc Af Amer: >60 mL/min (ref >60)
GFR calc non Af Amer: >60 mL/min (ref >60)
Glucose, Bld: 95 mg/dL (ref 70–99)
Potassium: 3.5 mmol/L (ref 3.5–5.1)
Sodium: 140 mmol/L (ref 135–145)
Total Bilirubin: 0.5 mg/dL (ref 0.3–1.2)
Total Protein: 4.4 g/dL — ABNORMAL LOW (ref 6.5–8.1)

## 2019-10-23 LAB — CBC
HCT: 30.3 % — ABNORMAL LOW (ref 36.0–46.0)
Hemoglobin: 9.5 g/dL — ABNORMAL LOW (ref 12.0–15.0)
MCH: 31.3 pg (ref 26.0–34.0)
MCHC: 31.4 g/dL (ref 30.0–36.0)
MCV: 99.7 fL (ref 80.0–100.0)
Platelets: 267 10*3/uL (ref 150–400)
RBC: 3.04 MIL/uL — ABNORMAL LOW (ref 3.87–5.11)
RDW: 14.7 % (ref 11.5–15.5)
WBC: 9.6 10*3/uL (ref 4.0–10.5)
nRBC: 0 % (ref 0.0–0.2)

## 2019-10-23 LAB — GLUCOSE, CAPILLARY
Glucose-Capillary: 95 mg/dL (ref 70–99)
Glucose-Capillary: 97 mg/dL (ref 70–99)
Glucose-Capillary: 110 mg/dL — ABNORMAL HIGH (ref 70–99)
Glucose-Capillary: 121 mg/dL — ABNORMAL HIGH (ref 70–99)
Glucose-Capillary: 77 mg/dL (ref 70–99)

## 2019-10-23 LAB — PREALBUMIN: Prealbumin: 6.3 mg/dL — ABNORMAL LOW (ref 18–38)

## 2019-10-23 MED ORDER — LOPERAMIDE HCL 1 MG/7.5ML PO SUSP
2.0000 mg | Freq: Four times a day (QID) | ORAL | Status: DC | PRN
  Administered 2019-10-23 – 2019-10-26 (×2): 2 mg
  Filled 2019-10-23 (×3): qty 15

## 2019-10-23 NOTE — Progress Notes (Signed)
Subjective:     Diarrhea resolved with vital tube feeds.  Had trouble sleeping last night.    Objective:  Vital signs for last 24 hours: Temp:  [97.8 F (36.6 C)-97.9 F (36.6 C)] 97.9 F (36.6 C) (11/29 0432) Pulse Rate:  [93-100] 100 (11/29 0432) Resp:  [16-20] 20 (11/29 0432) BP: (112-127)/(53-58) 127/58 (11/29 0432) SpO2:  [91 %-92 %] 92 % (11/29 0432) Weight:  [78.4 kg] 78.4 kg (11/29 0436)  Intake/Output from previous day: 11/28 0701 - 11/29 0700 In: 2382.5 [I.V.:1245.4; NG/GT:1047.1] Out: 280 [Emesis/NG output:280]  Intake/Output this shift: No intake/output data recorded.  Vent settings for last 24 hours:    Physical Exam:  Gen: awake, NAD.  Tearful.   Neuro: no acute issues.   HEENT: NGT in place, bilious output Pulm: unlabored breathing Abd: soft, NT, sl distended.  Anasarca.  Some clear yellow drainage from wound.  Redness continues to improve.   Extr: wwp   Results for orders placed or performed during the hospital encounter of 10/17/2019 (from the past 24 hour(s))  Glucose, capillary     Status: None   Collection Time: 10/22/19 12:30 PM  Result Value Ref Range   Glucose-Capillary 79 70 - 99 mg/dL   Comment 1 Notify RN    Comment 2 Document in Chart   Glucose, capillary     Status: None   Collection Time: 10/22/19  4:17 PM  Result Value Ref Range   Glucose-Capillary 89 70 - 99 mg/dL  Glucose, capillary     Status: None   Collection Time: 10/22/19  8:17 PM  Result Value Ref Range   Glucose-Capillary 91 70 - 99 mg/dL  Glucose, capillary     Status: Abnormal   Collection Time: 10/22/19 11:24 PM  Result Value Ref Range   Glucose-Capillary 105 (H) 70 - 99 mg/dL  Glucose, capillary     Status: None   Collection Time: 10/23/19  4:13 AM  Result Value Ref Range   Glucose-Capillary 95 70 - 99 mg/dL  Comprehensive metabolic panel     Status: Abnormal   Collection Time: 10/23/19  4:42 AM  Result Value Ref Range   Sodium 140 135 - 145 mmol/L   Potassium 3.5 3.5 - 5.1 mmol/L   Chloride 106 98 - 111 mmol/L   CO2 25 22 - 32 mmol/L   Glucose, Bld 95 70 - 99 mg/dL   BUN 10 8 - 23 mg/dL   Creatinine, Ser 0.47 0.44 - 1.00 mg/dL   Calcium 8.0 (L) 8.9 - 10.3 mg/dL   Total Protein 4.4 (L) 6.5 - 8.1 g/dL   Albumin 1.4 (L) 3.5 - 5.0 g/dL   AST 14 (L) 15 - 41 U/L   ALT 12 0 - 44 U/L   Alkaline Phosphatase 139 (H) 38 - 126 U/L   Total Bilirubin 0.5 0.3 - 1.2 mg/dL   GFR calc non Af Amer >60 >60 mL/min   GFR calc Af Amer >60 >60 mL/min   Anion gap 9 5 - 15  CBC     Status: Abnormal   Collection Time: 10/23/19  4:42 AM  Result Value Ref Range   WBC 9.6 4.0 - 10.5 K/uL   RBC 3.04 (L) 3.87 - 5.11 MIL/uL   Hemoglobin 9.5 (L) 12.0 - 15.0 g/dL   HCT 30.3 (L) 36.0 - 46.0 %   MCV 99.7 80.0 - 100.0 fL   MCH 31.3 26.0 - 34.0 pg   MCHC 31.4 30.0 - 36.0 g/dL  RDW 14.7 11.5 - 15.5 %   Platelets 267 150 - 400 K/uL   nRBC 0.0 0.0 - 0.2 %  Prealbumin     Status: Abnormal   Collection Time: 10/23/19  4:42 AM  Result Value Ref Range   Prealbumin 6.3 (L) 18 - 38 mg/dL  Glucose, capillary     Status: None   Collection Time: 10/23/19  8:13 AM  Result Value Ref Range   Glucose-Capillary 97 70 - 99 mg/dL    Assessment & Plan: Present on Admission:  . Adenocarcinoma of head of pancreas ([place]) H/o sepsis Protein calorie malnutrition BRCA 2 H/o breast cancer Gastric outlet obstruction   LOS: 26 days   Additional comments:I reviewed the patient's new clinical lab test results.    10F s/p Whipple  Pancreatic cancer - s/p Whipple by Dr. Barry Dienes 11/3 ABL anemia - stable Presumed delayed gastric emptying vs obstruction due to swelling at [place]- continue NGT to suction. May need venting G-tube. Stomach is close to abdominal wall. Will give a little more time.  May need surgical G tube if no other options are available.   FEN - vital AF tube feeds.  Hopefully this will improve nutritional status.   Endo - hypoglycemia resolved.     DVT - LMWH,  PT/OT Severe protein calorie malnutrition.  Prealbumin is 6.  Albumin 1.4.     Milus Height, MD FACS Surgical Oncology, General Surgery, Trauma and [place] [facility], [place] [phone] Check [email or web address] for coverage night/weekend   10/23/2019

## 2019-10-24 LAB — GLUCOSE, CAPILLARY
Glucose-Capillary: 113 mg/dL — ABNORMAL HIGH (ref 70–99)
Glucose-Capillary: 115 mg/dL — ABNORMAL HIGH (ref 70–99)
Glucose-Capillary: 119 mg/dL — ABNORMAL HIGH (ref 70–99)
Glucose-Capillary: 120 mg/dL — ABNORMAL HIGH (ref 70–99)
Glucose-Capillary: 133 mg/dL — ABNORMAL HIGH (ref 70–99)
Glucose-Capillary: 88 mg/dL (ref 70–99)
Glucose-Capillary: 94 mg/dL (ref 70–99)

## 2019-10-24 MED ORDER — FUROSEMIDE 10 MG/ML IJ SOLN
20.0000 mg | Freq: Every day | INTRAMUSCULAR | Status: AC
  Administered 2019-10-24 – 2019-10-26 (×3): 20 mg via INTRAVENOUS
  Filled 2019-10-24 (×3): qty 2

## 2019-10-24 MED ORDER — VITAL AF 1.2 CAL PO LIQD
1000.0000 mL | ORAL | Status: DC
  Administered 2019-10-24: 1000 mL
  Filled 2019-10-24 (×3): qty 1000

## 2019-10-24 NOTE — Progress Notes (Signed)
OT Cancellation Note  Patient Details Name: Julie Rollins MRN: [id number] DOB: [birth year]   Cancelled Treatment:    Reason Eval/Treat Not Completed: Other (comment). Pt in bed and reports that she feels nauseous and that she was given meds and it's helping a little bit but not enough to work with therapy right now. OT asked pt if she still prefers to work with therapy in the mornings and she stated "It doesn't matter"; informed pt that OT will return later  [name] 10/24/2019, 9:24 AM

## 2019-10-24 NOTE — Progress Notes (Signed)
Physical Therapy Treatment Patient Details Name: Julie Rollins MRN: [id number] DOB: June 20, 1946 Today's Date: 10/24/2019    History of Present Illness 73 y.o. female with medical history significant of pancreatic cancer, breast cancer status post lumpectomy and radiation, hypertension, hypothyroidism admitted s/p Whipple procedure.    PT Comments    Pt in bed upon PT arrival, and despite initial hesitation due to concern for incontinence with mobility, pt was agreeable to limited PT session. The pt was able to demo bed mobility with minA for movement of BLE and trunk elevation from elevated HOB, but the pt demos good initiation with all extremities. The pt was then able to sit EOB for ~8 min with good tolerance, perform seated calf raises, and perform X3 lateral scoot to reposition towards HOB. The pt then reported onset of dizziness and was returned to supine position where she completed exercises for general LE strength. The pt will continue to benefit from skilled PT to address functional mobility, seated balance, and strength for transfers.     Follow Up Recommendations  SNF     Equipment Recommendations  None recommended by PT    Recommendations for Other Services       Precautions / Restrictions Precautions Precautions: Fall;Other (comment) Precaution Comments: NG tube Restrictions Weight Bearing Restrictions: No    Mobility  Bed Mobility Overal bed mobility: Needs Assistance Bed Mobility: Sit to Supine;Rolling;Supine to Sit Rolling: Min guard   Supine to sit: Min assist;HOB elevated Sit to supine: Min assist;HOB elevated   General bed mobility comments: pt able to roll with min guard for pericare; pt required assistance for BLE movement to EOB and elevation of trunk, pt benefits from minA returning LEs to bed for sit to supine  Transfers Overall transfer level: (pt declined today due to concern for lines and incontinence)                  Ambulation/Gait                  Stairs             Wheelchair Mobility    Modified Rankin (Stroke Patients Only)       Balance Overall balance assessment: Needs assistance Sitting-balance support: No upper extremity supported Sitting balance-Leahy Scale: Fair Sitting balance - Comments: able to maintain upright sitting balance       Standing balance comment: NT today                            Cognition Arousal/Alertness: Awake/alert Behavior During Therapy: Anxious Overall Cognitive Status: Within Functional Limits for tasks assessed                                 General Comments: pt very anxious about all of the lines/leads connected to her at this point and declines OOB mobility due to concerns for lines and line management. pt is also weary of OOB mobility due to increased urination frequency with lasix      Exercises General Exercises - Lower Extremity Ankle Circles/Pumps: AROM;Both;20 reps Short Arc Quad: AROM;Both;10 reps;Supine Heel Raises: AROM;20 reps;Seated;Both    General Comments        Pertinent Vitals/Pain Pain Assessment: Faces Faces Pain Scale: Hurts little more Pain Location: abdomen Pain Descriptors / Indicators: Discomfort Pain Intervention(s): Monitored during session;Repositioned    Home Living  Prior Function            PT Goals (current goals can now be found in the care plan section) Acute Rehab PT Goals Patient Stated Goal: go home PT Goal Formulation: With patient Time For Goal Achievement: 11/21/2019 Potential to Achieve Goals: Good Progress towards PT goals: Progressing toward goals    Frequency    Min 2X/week      PT Plan Current plan remains appropriate    Co-evaluation              AM-PAC PT "6 Clicks" Mobility   Outcome Measure  Help needed turning from your back to your side while in a flat bed without using bedrails?: A Little Help needed moving from  lying on your back to sitting on the side of a flat bed without using bedrails?: A Little Help needed moving to and from a bed to a chair (including a wheelchair)?: A Lot Help needed standing up from a chair using your arms (e.g., wheelchair or bedside chair)?: A Lot Help needed to walk in hospital room?: A Lot Help needed climbing 3-5 steps with a railing? : Total 6 Click Score: 13    End of Session   Activity Tolerance: Patient tolerated treatment well;Patient limited by fatigue Patient left: in bed;with call bell/phone within reach;with nursing/sitter in room Nurse Communication: Mobility status PT Visit Diagnosis: Difficulty in walking, not elsewhere classified (R26.2);Muscle weakness (generalized) (M62.81)     Time: [id number] PT Time Calculation (min) (ACUTE ONLY): 28 min  Charges:  $Therapeutic Exercise: 8-22 mins $Therapeutic Activity: 8-22 mins                     Mickey Farber, PT, DPT   Acute Rehabilitation Department [phone]   Otho Bellows 10/24/2019, 3:42 PM

## 2019-10-24 NOTE — Progress Notes (Signed)
     Subjective:     "I can't take much more of this."  Feeling bad about the fluid.    Objective:  Vital signs for last 24 hours: Temp:  [97.7 F (36.5 C)-99.1 F (37.3 C)] 97.7 F (36.5 C) (11/30 0452) Pulse Rate:  [97-107] 97 (11/30 0452) Resp:  [16-17] 16 (11/30 0452) BP: (121-144)/(62-74) 144/72 (11/30 0452) SpO2:  [93 %-94 %] 93 % (11/30 0452)  Intake/Output from previous day: 11/29 0701 - 11/30 0700 In: 1950.4 [I.V.:1126.7; NG/GT:823.7] Out: 300 [Urine:100; Emesis/NG output:200]  Intake/Output this shift: No intake/output data recorded.  Vent settings for last 24 hours:    Physical Exam:  Gen: awake, NAD.   Neuro: no acute issues.   HEENT: NGT in place, bilious output Pulm: unlabored breathing Abd: soft, NT, sl distended.  Anasarca. Extr: wwp   Results for orders placed or performed during the hospital encounter of 10/23/2019 (from the past 24 hour(s))  Glucose, capillary     Status: Abnormal   Collection Time: 10/23/19 12:15 PM  Result Value Ref Range   Glucose-Capillary 110 (H) 70 - 99 mg/dL  Glucose, capillary     Status: Abnormal   Collection Time: 10/23/19  4:10 PM  Result Value Ref Range   Glucose-Capillary 121 (H) 70 - 99 mg/dL  Glucose, capillary     Status: None   Collection Time: 10/23/19  7:55 PM  Result Value Ref Range   Glucose-Capillary 77 70 - 99 mg/dL  Glucose, capillary     Status: Abnormal   Collection Time: 10/24/19  1:10 AM  Result Value Ref Range   Glucose-Capillary 120 (H) 70 - 99 mg/dL  Glucose, capillary     Status: None   Collection Time: 10/24/19  4:49 AM  Result Value Ref Range   Glucose-Capillary 94 70 - 99 mg/dL  Glucose, capillary     Status: Abnormal   Collection Time: 10/24/19  8:11 AM  Result Value Ref Range   Glucose-Capillary 113 (H) 70 - 99 mg/dL    Assessment & Plan: Present on Admission:  . Adenocarcinoma of head of pancreas ([place]) H/o sepsis Protein calorie malnutrition BRCA 2 H/o breast cancer Gastric  outlet obstruction   LOS: 27 days   Additional comments:I reviewed the patient's new clinical lab test results.    27F s/p Whipple  Pancreatic cancer - s/p Whipple by Dr. Barry Dienes 11/3 ABL anemia - stable Presumed delayed gastric emptying vs obstruction due to swelling at [place]- continue NGT to suction. May need venting G-tube. Stomach is close to abdominal wall. Will give a little more time.  May need surgical G tube if no other options are available.   FEN - vital AF tube feeds.  Hopefully this will improve nutritional status.  Increase to goal.  Lasix for 3 doses.   Endo - hypoglycemia resolved.     DVT - LMWH, PT/OT Severe protein calorie malnutrition.  Prealbumin is 6.  Albumin 1.4.     Milus Height, MD FACS Surgical Oncology, General Surgery, Trauma and [place] [facility], [place] [phone] Check [email or web address] for coverage night/weekend   10/24/2019

## 2019-10-24 NOTE — Care Management Important Message (Signed)
Important Message  Patient Details  Name: Julie Rollins MRN: [id number] Date of Birth: 1946-06-12   Medicare Important Message Given:  Yes     Memory Argue 10/24/2019, 2:00 PM

## 2019-10-24 NOTE — Progress Notes (Signed)
OT Cancellation Note  Patient Details Name: Julie Rollins MRN: [id number] DOB: 1946-07-03   Cancelled Treatment:    Reason Eval/Treat Not Completed: Other (comment). Attempted again to see pt and pt refused stating, " I was going to get up with you, but now they have given me something that has me peeing every 5 minutes so I just can't do it right now"  [name] 10/24/2019, 11:53 AM

## 2019-10-25 ENCOUNTER — Other Ambulatory Visit: Payer: Self-pay

## 2019-10-25 LAB — BASIC METABOLIC PANEL
Anion gap: 10 (ref 5–15)
BUN: 5 mg/dL — ABNORMAL LOW (ref 8–23)
CO2: 25 mmol/L (ref 22–32)
Calcium: 7.7 mg/dL — ABNORMAL LOW (ref 8.9–10.3)
Chloride: 105 mmol/L (ref 98–111)
Creatinine, Ser: 0.32 mg/dL — ABNORMAL LOW (ref 0.44–1.00)
GFR calc Af Amer: >60 mL/min (ref >60)
GFR calc non Af Amer: >60 mL/min (ref >60)
Glucose, Bld: 131 mg/dL — ABNORMAL HIGH (ref 70–99)
Potassium: 2.7 mmol/L — CL (ref 3.5–5.1)
Sodium: 140 mmol/L (ref 135–145)

## 2019-10-25 LAB — CBC
HCT: 28.4 % — ABNORMAL LOW (ref 36.0–46.0)
Hemoglobin: 9.3 g/dL — ABNORMAL LOW (ref 12.0–15.0)
MCH: 31.5 pg (ref 26.0–34.0)
MCHC: 32.7 g/dL (ref 30.0–36.0)
MCV: 96.3 fL (ref 80.0–100.0)
Platelets: 273 10*3/uL (ref 150–400)
RBC: 2.95 MIL/uL — ABNORMAL LOW (ref 3.87–5.11)
RDW: 14.6 % (ref 11.5–15.5)
WBC: 6.2 10*3/uL (ref 4.0–10.5)
nRBC: 0 % (ref 0.0–0.2)

## 2019-10-25 LAB — GLUCOSE, CAPILLARY
Glucose-Capillary: 103 mg/dL — ABNORMAL HIGH (ref 70–99)
Glucose-Capillary: 105 mg/dL — ABNORMAL HIGH (ref 70–99)
Glucose-Capillary: 106 mg/dL — ABNORMAL HIGH (ref 70–99)
Glucose-Capillary: 124 mg/dL — ABNORMAL HIGH (ref 70–99)
Glucose-Capillary: 94 mg/dL (ref 70–99)
Glucose-Capillary: 97 mg/dL (ref 70–99)

## 2019-10-25 LAB — SURGICAL PATHOLOGY

## 2019-10-25 LAB — POTASSIUM: Potassium: 3.4 mmol/L — ABNORMAL LOW (ref 3.5–5.1)

## 2019-10-25 MED ORDER — POTASSIUM CHLORIDE 10 MEQ/100ML IV SOLN
INTRAVENOUS | Status: AC
  Filled 2019-10-25: qty 100

## 2019-10-25 MED ORDER — POTASSIUM CHLORIDE 10 MEQ/100ML IV SOLN
10.0000 meq | INTRAVENOUS | Status: AC
  Administered 2019-10-25 (×3): 10 meq via INTRAVENOUS
  Filled 2019-10-25: qty 100

## 2019-10-25 MED ORDER — POTASSIUM CHLORIDE 10 MEQ/100ML IV SOLN
10.0000 meq | INTRAVENOUS | Status: DC
  Administered 2019-10-25: 10 meq via INTRAVENOUS
  Filled 2019-10-25: qty 100

## 2019-10-25 MED ORDER — POTASSIUM CHLORIDE 10 MEQ/100ML IV SOLN
10.0000 meq | INTRAVENOUS | Status: AC
  Administered 2019-10-25 (×4): 10 meq via INTRAVENOUS
  Filled 2019-10-25 (×4): qty 100

## 2019-10-25 MED ORDER — VITAL AF 1.2 CAL PO LIQD
1000.0000 mL | ORAL | Status: DC
  Administered 2019-10-25 – 2019-10-27 (×5): 1000 mL
  Filled 2019-10-25 (×8): qty 1000

## 2019-10-25 MED ORDER — PROMETHAZINE HCL 25 MG/ML IJ SOLN
6.2500 mg | Freq: Four times a day (QID) | INTRAMUSCULAR | Status: DC | PRN
  Administered 2019-10-25 – 2019-11-09 (×17): 6.25 mg via INTRAVENOUS
  Filled 2019-10-25 (×17): qty 1

## 2019-10-25 NOTE — TOC Progression Note (Signed)
Transition of Care [facility]) - Progression Note    Patient Details  Name: Julie Rollins MRN: [id number] Date of Birth: [birth year]  Transition of Care [facility]) CM/SW [place], [place] Phone Number: 10/25/2019, 10:10 AM  Clinical Narrative:    TOC team continue to follow for placement. Pt remains with ngtube, not medically stable at this time.  Expected Discharge Plan: [place] Barriers to Discharge: Continued Medical Work up, Ship broker  Expected Discharge Plan and Services Expected Discharge Plan: [place] In-house Referral: Clinical Social Work Discharge Planning Services: CM Consult Post Acute Care Choice: [place] arrangements for the past 2 months: Single Family Home  Readmission Risk Interventions Readmission Risk Prevention Plan 09/30/2019 09/13/2019  Transportation Screening Complete Complete  Medication Review Press photographer) Referral to Pharmacy Referral to Pharmacy  PCP or Specialist appointment within 3-5 days of discharge Complete Not Complete  PCP/Specialist Appt Not Complete comments - pt established at [facility] and with PCP but DC date unknown  HRI or Home Care Consult Complete Complete  SW Recovery Care/Counseling Consult Complete Complete  Palliative Care Screening Not Complete Not Complete  Comments - pt in procedure did not discuss if any past palliative care involvement, however none in current encounter  [place] Complete Not Applicable  Some recent data might be hidden

## 2019-10-25 NOTE — Progress Notes (Signed)
Nutrition Follow-up  DOCUMENTATION CODES:   Non-severe (moderate) malnutrition in context of chronic illness  INTERVENTION:   -D/c Prostat Increase Vital AF 1.2 @ 60 ml/hr via j-tube  Tube feeding regimen provides 1728 kcal (91% of needs), 108 grams of protein, and 1168 ml of H2O.   NUTRITION DIAGNOSIS:   Moderate Malnutrition related to chronic illness(pancreatic cancer s/p whipple) as evidenced by mild fat depletion, moderate fat depletion, mild muscle depletion, moderate muscle depletion.  Ongoing  GOAL:   Patient will meet greater than or equal to 90% of their needs  Progressing   MONITOR:   PO intake, Supplement acceptance, Diet advancement, Labs, Weight trends, Skin, I & O's  REASON FOR ASSESSMENT:   Consult Enteral/tube feeding initiation and management  ASSESSMENT:   Pt with PMH of breast cancer s/p lumpectomy and XRT, hypothyroidism, HTN, dx with pancreatic cancer May 2020 s/p ERCP with stent, chemo (complicated by nausea, weight loss, duodenal obstruction requiring TPN) now admitted for pancreaticoduodenectomy with cholecystectomy with removal of 4 cm from pancreatic head.  Procedure(11/3): WHIPPLE PROCEDURE (Abdomen) GASTROJEJUNOSTOMY, J TUBE (Abdomen) for adenocarcinoma of the pancreas, s/p neoadjuvant chemo and neoadjuvant SBRT 11/17- NGT inserted 11/19- TF restarted 11/27- s/p upper GI endoscopy- revealed esophagitis (biopsied) and no window for PEG placement 11/28- switched to Vital formula due to diarrhea  Reviewed I/O's: -3.4 L x 24 hours and +9.1 L since 10/11/19  UOP: 3 L x 24 hours  NGT output: 400 ml x 24 hours  Pt reports feeling a little bit better today, however, complains of nausea and frequent urination ("I'm not sure how much longer I can take this").   Pt reports improved diarrhea since tube feeding formula was changed. Pt reports "I really wish I could eat solid food now". RD explained rationale for tube feedings as well as need to  be kept NPO due to NGT; pt accepted explanation. Discussed importance of continued tube feedings to promote healing.   Pt currently receiving Vital AF 1.2 @ 55 ml/hr with 30 ml Prostat daily. Complete regimen provides 1684 kcals (88% of estimated kcal needs), 114 grams protein, and 1071 ml free water daily. RD will augment TF regimen to better meet pt needs.   Labs reviewed: K: 2.7, CBGS: 103-124 (inpatent orders for glycemic control are 2-6 units insulin aspart every 4 hours and 5 units insulin detemir every 12 hours).   NUTRITION - FOCUSED PHYSICAL EXAM:    Most Recent Value  Orbital Region  Moderate depletion  Upper Arm Region  Moderate depletion  Thoracic and Lumbar Region  Mild depletion  Buccal Region  Mild depletion  Temple Region  Moderate depletion  Clavicle Bone Region  Moderate depletion  Clavicle and Acromion Bone Region  Moderate depletion  Scapular Bone Region  Moderate depletion  Dorsal Hand  Moderate depletion  Patellar Region  Mild depletion  Anterior Thigh Region  Mild depletion  Posterior Calf Region  Mild depletion  Edema (RD Assessment)  Mild  Hair  Reviewed  Eyes  Reviewed  Mouth  Reviewed  Skin  Reviewed  Nails  Reviewed       Diet Order:   Diet Order            Diet NPO time specified Except for: Ice Chips, Sips with Meds  Diet effective now              EDUCATION NEEDS:   Education needs have been addressed  Skin:  Skin Assessment: Skin Integrity Issues: Skin Integrity Issues::  Incisions Incisions: abdomen  Last BM:  10/25/19  Height:   Ht Readings from Last 1 Encounters:  10/18/2019 5\' 4"  (1.626 m)    Weight:   Wt Readings from Last 1 Encounters:  10/23/19 78.4 kg    Ideal Body Weight:  54.5 kg  BMI:  Body mass index is 29.67 kg/m.  Estimated Nutritional Needs:   Kcal:  1900-2100  Protein:  105-120 grams  Fluid:  > 1.9 L    Julie Rollins A. Jimmye Norman, RD, LDN, [place] Registered Dietitian II Certified Diabetes Care and  Education Specialist Pager: [phone] After hours Pager: [phone]

## 2019-10-25 NOTE — Progress Notes (Signed)
Subjective:     Feeling nauseated today.    Objective:  Vital signs for last 24 hours: Temp:  [97.8 F (36.6 C)-97.9 F (36.6 C)] 97.8 F (36.6 C) (12/01 0404) Pulse Rate:  [89-92] 89 (12/01 0404) Resp:  [14-17] 14 (12/01 0404) BP: (127-135)/(63-66) 135/63 (12/01 0404) SpO2:  [94 %-95 %] 95 % (12/01 0404)  Intake/Output from previous day: 11/30 0701 - 12/01 0700 In: -  Out: 3400 [Urine:3000; Emesis/NG output:400]  Intake/Output this shift: Total I/O In: 0  Out: 500 [Urine:500]  Vent settings for last 24 hours:    Physical Exam:  Gen: awake, NAD.   Neuro: no acute issues.   HEENT: NGT in place, bilious output, but not working well.  Adjusted and flushed, working well.   Pulm: unlabored breathing Abd: soft, NT, sl distended.  Anasarca. Extr: wwp   Results for orders placed or performed during the hospital encounter of 10/11/2019 (from the past 24 hour(s))  Glucose, capillary     Status: Abnormal   Collection Time: 10/24/19 12:11 PM  Result Value Ref Range   Glucose-Capillary 133 (H) 70 - 99 mg/dL  Glucose, capillary     Status: None   Collection Time: 10/24/19  4:20 PM  Result Value Ref Range   Glucose-Capillary 88 70 - 99 mg/dL  Glucose, capillary     Status: Abnormal   Collection Time: 10/24/19  7:42 PM  Result Value Ref Range   Glucose-Capillary 115 (H) 70 - 99 mg/dL  Glucose, capillary     Status: Abnormal   Collection Time: 10/24/19 11:42 PM  Result Value Ref Range   Glucose-Capillary 119 (H) 70 - 99 mg/dL  Glucose, capillary     Status: Abnormal   Collection Time: 10/25/19  4:01 AM  Result Value Ref Range   Glucose-Capillary 124 (H) 70 - 99 mg/dL  Basic metabolic panel     Status: Abnormal   Collection Time: 10/25/19  4:27 AM  Result Value Ref Range   Sodium 140 135 - 145 mmol/L   Potassium 2.7 (LL) 3.5 - 5.1 mmol/L   Chloride 105 98 - 111 mmol/L   CO2 25 22 - 32 mmol/L   Glucose, Bld 131 (H) 70 - 99 mg/dL   BUN 5 (L) 8 - 23 mg/dL   Creatinine, Ser 0.32 (L) 0.44 - 1.00 mg/dL   Calcium 7.7 (L) 8.9 - 10.3 mg/dL   GFR calc non Af Amer >60 >60 mL/min   GFR calc Af Amer >60 >60 mL/min   Anion gap 10 5 - 15  CBC     Status: Abnormal   Collection Time: 10/25/19  4:27 AM  Result Value Ref Range   WBC 6.2 4.0 - 10.5 K/uL   RBC 2.95 (L) 3.87 - 5.11 MIL/uL   Hemoglobin 9.3 (L) 12.0 - 15.0 g/dL   HCT 28.4 (L) 36.0 - 46.0 %   MCV 96.3 80.0 - 100.0 fL   MCH 31.5 26.0 - 34.0 pg   MCHC 32.7 30.0 - 36.0 g/dL   RDW 14.6 11.5 - 15.5 %   Platelets 273 150 - 400 K/uL   nRBC 0.0 0.0 - 0.2 %  Glucose, capillary     Status: Abnormal   Collection Time: 10/25/19  8:05 AM  Result Value Ref Range   Glucose-Capillary 106 (H) 70 - 99 mg/dL    Assessment & Plan: Present on Admission:  . Adenocarcinoma of head of pancreas ([place]) H/o sepsis Protein calorie malnutrition BRCA 2 H/o  breast cancer Gastric outlet obstruction   LOS: 28 days   Additional comments:I reviewed the patient's new clinical lab test results.    71F s/p Whipple  Pancreatic cancer - s/p Whipple by Dr. Barry Dienes 11/3 ABL anemia - stable Presumed delayed gastric emptying vs obstruction due to swelling at [place]- continue NGT to suction. May need venting G-tube. Stomach is close to abdominal wall. Will give a little more time.  May need surgical G tube if no other options are available.   FEN - vital AF tube feeds.  Hopefully this will improve nutritional status.   Lasix for 3 doses.   Very hypokalemic today.  Replete K and recheck this afternoon.   Endo - hypoglycemia resolved.     DVT - LMWH, PT/OT Severe protein calorie malnutrition.  Prealbumin is 6.  Albumin 1.4.     Milus Height, MD FACS Surgical Oncology, General Surgery, Trauma and [place] [facility], [place] [phone] Check [email or web address] for coverage night/weekend   10/25/2019

## 2019-10-25 NOTE — Progress Notes (Signed)
CRITICAL VALUE ALERT  Critical Value:  K+ 2.7  Date & Time Notied:10/25/2019 0542  Provider Notified:Burke Grandville Silos MD  Orders Received/Actions taken: Potassium ordered.

## 2019-10-25 NOTE — Plan of Care (Signed)
  Problem: Clinical Measurements: Goal: Respiratory complications will improve Outcome: Progressing Goal: Cardiovascular complication will be avoided Outcome: Progressing   Problem: Activity: Goal: Risk for activity intolerance will decrease Outcome: Progressing   Problem: Coping: Goal: Level of anxiety will decrease Outcome: Progressing   

## 2019-10-25 DEATH — deceased

## 2019-10-26 LAB — GLUCOSE, CAPILLARY
Glucose-Capillary: 101 mg/dL — ABNORMAL HIGH (ref 70–99)
Glucose-Capillary: 102 mg/dL — ABNORMAL HIGH (ref 70–99)
Glucose-Capillary: 104 mg/dL — ABNORMAL HIGH (ref 70–99)
Glucose-Capillary: 111 mg/dL — ABNORMAL HIGH (ref 70–99)
Glucose-Capillary: 112 mg/dL — ABNORMAL HIGH (ref 70–99)
Glucose-Capillary: 119 mg/dL — ABNORMAL HIGH (ref 70–99)

## 2019-10-26 MED ORDER — LOPERAMIDE HCL 1 MG/7.5ML PO SUSP
2.0000 mg | Freq: Two times a day (BID) | ORAL | Status: DC
  Administered 2019-10-26 – 2019-10-29 (×6): 2 mg via ORAL
  Filled 2019-10-26 (×10): qty 15

## 2019-10-26 MED ORDER — POTASSIUM CHLORIDE 10 MEQ/100ML IV SOLN
10.0000 meq | INTRAVENOUS | Status: AC
  Administered 2019-10-26 (×4): 10 meq via INTRAVENOUS
  Filled 2019-10-26 (×3): qty 100

## 2019-10-26 NOTE — Progress Notes (Signed)
     Subjective:     Having less swelling in LE today.      Objective:  Vital signs for last 24 hours: Temp:  [97.7 F (36.5 C)-98.3 F (36.8 C)] 98 F (36.7 C) (12/02 0407) Pulse Rate:  [86-96] 86 (12/02 0407) Resp:  [16-20] 16 (12/02 0407) BP: (126-134)/(65-67) 126/65 (12/02 0407) SpO2:  [93 %-95 %] 93 % (12/02 0407)  Intake/Output from previous day: 12/01 0701 - 12/02 0700 In: 4001.5 [I.V.:1448.9; NG/GT:2359.3; IV Piggyback:193.3] Out: 2250 [Urine:1600; Emesis/NG output:650]  Intake/Output this shift: Total I/O In: 0  Out: 1300 [Urine:1300]  Vent settings for last 24 hours:    Physical Exam:  Gen: awake, NAD.   Neuro: no acute issues.   HEENT: NGT in place, bilious output. Pulm: unlabored breathing Abd: soft, NT, sl distended.  Anasarca. Extr: wwp   Results for orders placed or performed during the hospital encounter of 10/23/2019 (from the past 24 hour(s))  Glucose, capillary     Status: None   Collection Time: 10/25/19  5:00 PM  Result Value Ref Range   Glucose-Capillary 94 70 - 99 mg/dL  Potassium     Status: Abnormal   Collection Time: 10/25/19  5:53 PM  Result Value Ref Range   Potassium 3.4 (L) 3.5 - 5.1 mmol/L  Glucose, capillary     Status: None   Collection Time: 10/25/19  7:45 PM  Result Value Ref Range   Glucose-Capillary 97 70 - 99 mg/dL  Glucose, capillary     Status: Abnormal   Collection Time: 10/25/19 11:41 PM  Result Value Ref Range   Glucose-Capillary 105 (H) 70 - 99 mg/dL  Glucose, capillary     Status: Abnormal   Collection Time: 10/26/19  4:04 AM  Result Value Ref Range   Glucose-Capillary 102 (H) 70 - 99 mg/dL  Glucose, capillary     Status: Abnormal   Collection Time: 10/26/19  7:25 AM  Result Value Ref Range   Glucose-Capillary 111 (H) 70 - 99 mg/dL  Glucose, capillary     Status: Abnormal   Collection Time: 10/26/19 11:47 AM  Result Value Ref Range   Glucose-Capillary 119 (H) 70 - 99 mg/dL    Assessment & Plan: Present  on Admission:  . Adenocarcinoma of head of pancreas ([place]) H/o sepsis Protein calorie malnutrition BRCA 2 H/o breast cancer Gastric outlet obstruction   LOS: 29 days   Additional comments:I reviewed the patient's new clinical lab test results.    28F s/p Whipple  Pancreatic cancer - s/p Whipple by Dr. Barry Dienes 11/3 ABL anemia - stable Presumed delayed gastric emptying vs obstruction due to swelling at [place]- continue NGT to suction. May need venting G-tube. Stomach is close to abdominal wall. Will give a little more time.  May need surgical G tube if no other options are available.   FEN - vital AF tube feeds.  Hopefully this will improve nutritional status.   Lasix for 3 doses.   Replete K again Endo - hypoglycemia resolved.     DVT - LMWH, PT/OT Severe protein calorie malnutrition.  Prealbumin is 6.  Albumin 1.4. Recheck on Sunday.       Milus Height, MD FACS Surgical Oncology, General Surgery, Trauma and [place] [facility], [place] [phone] Check [email or web address] for coverage night/weekend   10/26/2019

## 2019-10-26 NOTE — Progress Notes (Signed)
Occupational Therapy Treatment Patient Details Name: Julie Rollins MRN: [id number] DOB: [birth year] Today's Date: 10/26/2019    History of present illness 73 y.o. female with medical history significant of pancreatic cancer, breast cancer status post lumpectomy and radiation, hypertension, hypothyroidism admitted s/p Whipple procedure.   OT comments  Pt making steady, but slow progress towards OT goals this session. Pt agreeable to sitting EOB for therex. Pt required MIN A for bed mobility. Pt presents with self- limiting behaviors stating, "I can't do it" without trying. Pt benefits from MAX encouragement and step by step instruction before completing movement. Pt sat EOB ~ 7 minutes and completed therex as indicated below. DC plan remains appropriate, will continue to follow acutely per POC.    Follow Up Recommendations  SNF;Supervision/Assistance - 24 hour    Equipment Recommendations  3 in 1 bedside commode;Hospital bed    Recommendations for Other Services      Precautions / Restrictions Precautions Precautions: Fall Precaution Comments: NG tube Restrictions Weight Bearing Restrictions: No       Mobility Bed Mobility Overal bed mobility: Needs Assistance Bed Mobility: Sit to Supine;Supine to Sit     Supine to sit: Min assist;HOB elevated Sit to supine: Min assist;HOB elevated   General bed mobility comments: MIN A for all bed mobility needing step by step directions. assist to maneuver BLEs to EOB; assist to scoot hips to EOB anf elevate trunk into sitting  Transfers                 General transfer comment: declined    Balance Overall balance assessment: Needs assistance Sitting-balance support: No upper extremity supported Sitting balance-Leahy Scale: Fair Sitting balance - Comments: able to maintain upright sitting balance                                   ADL either performed or assessed with clinical judgement   ADL Overall ADL's :  Needs assistance/impaired                     Lower Body Dressing: Total assistance;Bed level     Toilet Transfer Details (indicate cue type and reason): declined OOB transfer           General ADL Comments: pt particular about things but participatory in session. Pt agreeable to sitting EOB ~ 7 minutes for therex     Vision Baseline Vision/History: Wears glasses Wears Glasses: At all times Patient Visual Report: No change from baseline Vision Assessment?: No apparent visual deficits   Perception     Praxis      Cognition Arousal/Alertness: Awake/alert Behavior During Therapy: Anxious Overall Cognitive Status: Within Functional Limits for tasks assessed                                 General Comments: pt very particular and anxious about all movement. consistently states "I can't do it" without trying.        Exercises General Exercises - Upper Extremity Shoulder Flexion: AROM;Both;10 reps;Seated General Exercises - Lower Extremity Hip Flexion/Marching: AROM;Both;5 reps;Seated Other Exercises Other Exercises: knee extension x10 BLEs from sitting EOb Other Exercises: BUE reaching exerices to facilitate core activation   Shoulder Instructions       General Comments      Pertinent Vitals/ Pain       Pain Assessment:  Faces Faces Pain Scale: Hurts little more Pain Location: abdomen Pain Descriptors / Indicators: Discomfort Pain Intervention(s): Monitored during session;Repositioned  Home Living                                          Prior Functioning/Environment              Frequency  Min 2X/week        Progress Toward Goals  OT Goals(current goals can now be found in the care plan section)  Progress towards OT goals: Progressing toward goals  Acute Rehab OT Goals Patient Stated Goal: go home OT Goal Formulation: With patient Time For Goal Achievement: 10/29/19 Potential to Achieve Goals: Good   Plan Discharge plan remains appropriate    Co-evaluation                 AM-PAC OT "6 Clicks" Daily Activity     Outcome Measure   Help from another person eating meals?: None Help from another person taking care of personal grooming?: A Little Help from another person toileting, which includes using toliet, bedpan, or urinal?: A Little Help from another person bathing (including washing, rinsing, drying)?: A Little Help from another person to put on and taking off regular upper body clothing?: A Little Help from another person to put on and taking off regular lower body clothing?: A Little 6 Click Score: 19    End of Session    OT Visit Diagnosis: Unsteadiness on feet (R26.81);Muscle weakness (generalized) (M62.81);Pain   Activity Tolerance Patient tolerated treatment well   Patient Left in bed;with call bell/phone within reach;with chair alarm set;with nursing/sitter in room   Nurse Communication Mobility status        Time: [id number] OT Time Calculation (min): 31 min  Charges: OT General Charges $OT Visit: 1 Visit OT Treatments $Therapeutic Activity: 8-22 mins $Therapeutic Exercise: 8-22 mins  Lanier Clam., COTA/L Acute Rehabilitation Services [phone]    [name] 10/26/2019, 4:12 PM

## 2019-10-26 NOTE — Progress Notes (Signed)
OT Cancellation Note  Patient Details Name: Julie Rollins MRN: [id number] DOB: January 17, 1946   Cancelled Treatment:    Reason Eval/Treat Not Completed: Patient declined, no reason specified;Other (comment) Pt reports she's "peeing back to back" and "can't do anything right now." Educated pt on importance of continued functional mobility even to EOB with pt still declining. Will check back as time allows.   Lanier Clam., COTA/L Acute Rehabilitation Services [phone] [place] 10/26/2019, 11:37 AM

## 2019-10-26 NOTE — Plan of Care (Signed)
  Problem: Respiratory: Goal: Will regain and/or maintain adequate ventilation Outcome: Progressing   Problem: Skin Integrity: Goal: Wound healing without signs and symptoms of infection will improve Outcome: Progressing Goal: Risk for impaired skin integrity will decrease Outcome: Progressing   Problem: Urinary Elimination: Goal: Ability to achieve and maintain adequate urine output will improve Outcome: Progressing

## 2019-10-27 ENCOUNTER — Inpatient Hospital Stay (HOSPITAL_COMMUNITY): Payer: Medicare HMO

## 2019-10-27 LAB — GLUCOSE, CAPILLARY
Glucose-Capillary: 112 mg/dL — ABNORMAL HIGH (ref 70–99)
Glucose-Capillary: 107 mg/dL — ABNORMAL HIGH (ref 70–99)
Glucose-Capillary: 84 mg/dL (ref 70–99)
Glucose-Capillary: 60 mg/dL — ABNORMAL LOW (ref 70–99)
Glucose-Capillary: 104 mg/dL — ABNORMAL HIGH (ref 70–99)

## 2019-10-27 MED ORDER — FUROSEMIDE 10 MG/ML IJ SOLN
20.0000 mg | Freq: Once | INTRAMUSCULAR | Status: AC
  Administered 2019-10-27: 20 mg via INTRAVENOUS
  Filled 2019-10-27: qty 2

## 2019-10-27 NOTE — Progress Notes (Signed)
     Subjective:     Continues to have less swelling.  Still having "lots of this green goo in the tube."     Objective:  Vital signs for last 24 hours: Temp:  [97.5 F (36.4 C)-98.1 F (36.7 C)] 98.1 F (36.7 C) (12/03 0340) Pulse Rate:  [97-99] 97 (12/03 0340) Resp:  [14-17] 14 (12/03 0340) BP: (105-127)/(62-94) 125/65 (12/03 0340) SpO2:  [93 %-96 %] 93 % (12/03 0340) Weight:  [76 kg] 76 kg (12/03 0500)  Intake/Output from previous day: 12/02 0701 - 12/03 0700 In: 2060.5 [I.V.:198.1; NG/GT:1706; IV Piggyback:66.4] Out: 2400 [Urine:2150; Emesis/NG output:250]  Intake/Output this shift: No intake/output data recorded.  Vent settings for last 24 hours:    Physical Exam:  Gen: awake, NAD.   Neuro: no acute issues.   HEENT: NGT in place, bilious output. Pulm: unlabored breathing Abd: soft, NT, sl distended.  Anasarca improving. Extr: wwp   Results for orders placed or performed during the hospital encounter of 10/22/2019 (from the past 24 hour(s))  Glucose, capillary     Status: Abnormal   Collection Time: 10/26/19  4:06 PM  Result Value Ref Range   Glucose-Capillary 101 (H) 70 - 99 mg/dL  Glucose, capillary     Status: Abnormal   Collection Time: 10/26/19  8:13 PM  Result Value Ref Range   Glucose-Capillary 112 (H) 70 - 99 mg/dL  Glucose, capillary     Status: Abnormal   Collection Time: 10/26/19 11:54 PM  Result Value Ref Range   Glucose-Capillary 104 (H) 70 - 99 mg/dL  Glucose, capillary     Status: Abnormal   Collection Time: 10/27/19  3:36 AM  Result Value Ref Range   Glucose-Capillary 112 (H) 70 - 99 mg/dL  Glucose, capillary     Status: Abnormal   Collection Time: 10/27/19  7:42 AM  Result Value Ref Range   Glucose-Capillary 107 (H) 70 - 99 mg/dL    Assessment & Plan: Present on Admission:  . Adenocarcinoma of head of pancreas ([place]) H/o sepsis Protein calorie malnutrition BRCA 2 H/o breast cancer Gastric outlet obstruction   LOS: 30 days    Additional comments:I reviewed the patient's new clinical lab test results.    50F s/p Whipple  Pancreatic cancer - s/p Whipple by Dr. Barry Dienes 11/3.  ypT2N1 ABL anemia - stable Presumed delayed gastric emptying vs obstruction due to swelling at [place]- continue NGT to suction. May need venting G-tube. Stomach is close to abdominal wall. Will give a little more time.  May need surgical G tube if no other options are available.  Repeat CT today.   FEN - vital AF tube feeds.  Hopefully this will improve nutritional status.   Additional lasix today.   Endo - hypoglycemia resolved.     DVT - LMWH, PT/OT Severe protein calorie malnutrition.  Prealbumin is 6.  Albumin 1.4. Recheck on Sunday.       Milus Height, MD FACS Surgical Oncology, General Surgery, Trauma and [place] [facility], [place] [phone] Check [email or web address] for coverage night/weekend   10/27/2019

## 2019-10-27 NOTE — Progress Notes (Signed)
PT Cancellation Note  Patient Details Name: Julie Rollins MRN: [id number] DOB: 02/12/1946   Cancelled Treatment:    Reason Eval/Treat Not Completed: Patient declined, no reason specified. Pt reports she has CT scan soon this afternoon and is too tired to complete PT session prior to CT. Pt became tearful when PT discussed benefits of mobility, pt left in bed with all needs met. PT will continue to follow as time/schedule allow.   Julie Rollins, PT, DPT   Acute Rehabilitation Department [phone]   Otho Bellows 10/27/2019, 3:18 PM

## 2019-10-27 NOTE — Progress Notes (Signed)
Nutrition Follow-up  DOCUMENTATION CODES:   Non-severe (moderate) malnutrition in context of chronic illness  INTERVENTION:   -Continue Vital AF 1.2 @ 60 ml/hr via j-tube  Tube feeding regimen provides 1728 kcal (91% of needs), 108 grams of protein, and 1168 ml of H2O.   NUTRITION DIAGNOSIS:   Moderate Malnutrition related to chronic illness(pancreatic cancer s/p whipple) as evidenced by mild fat depletion, moderate fat depletion, mild muscle depletion, moderate muscle depletion.  Ongoing  GOAL:   Patient will meet greater than or equal to 90% of their needs  Met with TF  MONITOR:   PO intake, Supplement acceptance, Diet advancement, Labs, Weight trends, Skin, I & O's  REASON FOR ASSESSMENT:   Consult Enteral/tube feeding initiation and management  ASSESSMENT:   Pt with PMH of breast cancer s/p lumpectomy and XRT, hypothyroidism, HTN, dx with pancreatic cancer May 2020 s/p ERCP with stent, chemo (complicated by nausea, weight loss, duodenal obstruction requiring TPN) now admitted for pancreaticoduodenectomy with cholecystectomy with removal of 4 cm from pancreatic head.  Procedure(11/3): WHIPPLE PROCEDURE (Abdomen) GASTROJEJUNOSTOMY, J TUBE (Abdomen) for adenocarcinoma of the pancreas, s/p neoadjuvant chemo and neoadjuvant SBRT 11/17- NGT inserted 11/19- TF restarted 11/27- s/p upper GI endoscopy- revealed esophagitis (biopsied) and no window for PEG placement 11/28- switched to Vital formula due to diarrhea  Reviewed I/O's: -340 ml x 24 hours and +11.8 L since 10/13/19  UOP: 2.2 L x 24 hours  NGT output: 250 ml x 24 hours  Pt resting quietly at time of visit with NGT in place to low intermittent suction. Per MD notes, pt may require venting g-tube.   TF infusing via j-tube:Vial AF 1.2 @ 60 ml/hr, which provides 1728 kcals, 108 grams protein, and 1168 ml free water daily, meeting 100% of estimated kcals and protein.   Labs reviewed: CBGS: 104-112 (inpatient  orders for glycemic control are 2-6 units insulin aspart every 4 hours and 5 units insulin detemir every 12 hours).   Diet Order:   Diet Order            Diet NPO time specified Except for: Ice Chips, Sips with Meds  Diet effective now              EDUCATION NEEDS:   Education needs have been addressed  Skin:  Skin Assessment: Skin Integrity Issues: Skin Integrity Issues:: Incisions Incisions: abdomen  Last BM:  10/27/19  Height:   Ht Readings from Last 1 Encounters:  10/01/2019 _0  (1.626 m)    Weight:   Wt Readings from Last 1 Encounters:  10/27/19 76 kg    Ideal Body Weight:  54.5 kg  BMI:  Body mass index is 28.76 kg/m.  Estimated Nutritional Needs:   Kcal:  1900-2100  Protein:  105-120 grams  Fluid:  > 1.9 L    Khai Torbert A. Jimmye Norman, RD, LDN, [place] Registered Dietitian II Certified Diabetes Care and Education Specialist Pager: [phone] After hours Pager: [phone]

## 2019-10-28 LAB — GLUCOSE, CAPILLARY
Glucose-Capillary: 102 mg/dL — ABNORMAL HIGH (ref 70–99)
Glucose-Capillary: 119 mg/dL — ABNORMAL HIGH (ref 70–99)
Glucose-Capillary: 126 mg/dL — ABNORMAL HIGH (ref 70–99)
Glucose-Capillary: 134 mg/dL — ABNORMAL HIGH (ref 70–99)
Glucose-Capillary: 92 mg/dL (ref 70–99)
Glucose-Capillary: 95 mg/dL (ref 70–99)
Glucose-Capillary: 99 mg/dL (ref 70–99)

## 2019-10-28 NOTE — TOC Progression Note (Signed)
Transition of Care [facility]) - Progression Note    Patient Details  Name: Julie Rollins MRN: [id number] Date of Birth: Apr 20, 1946  Transition of Care [facility]) CM/SW [place], [place] Phone Number: 10/28/2019, 11:18 AM  Clinical Narrative:    TOC team continues to follow for medical stability to better assess disposition. At this time it is recommended for pt to go to SNF however medical w/u continues. CSW spoke with [facility] team leadership who recommended that another PMT assessment could be pertinent given pt long hospitalization/medical dxs.   Expected Discharge Plan: Skilled Nursing Facility Barriers to Discharge: Ship broker, Continued Medical Work up  Expected Discharge Plan and Services Expected Discharge Plan: [place] In-house Referral: Clinical Social Work Discharge Planning Services: CM Consult Post Acute Care Choice: [place] arrangements for the past 2 months: Single Family Home   Readmission Risk Interventions Readmission Risk Prevention Plan 09/30/2019 09/13/2019  Transportation Screening Complete Complete  Medication Review Press photographer) Referral to Pharmacy Referral to Pharmacy  PCP or Specialist appointment within 3-5 days of discharge Complete Not Complete  PCP/Specialist Appt Not Complete comments - pt established at [facility] and with PCP but DC date unknown  HRI or Home Care Consult Complete Complete  SW Recovery Care/Counseling Consult Complete Complete  Palliative Care Screening Not Complete Not Complete  Comments - pt in procedure did not discuss if any past palliative care involvement, however none in current encounter  [place] Complete Not Applicable  Some recent data might be hidden

## 2019-10-28 NOTE — Progress Notes (Signed)
Physical Therapy Treatment Patient Details Name: Julie Rollins MRN: [id number] DOB: [birth year] Today's Date: 10/28/2019    History of Present Illness 73 y.o. female with medical history significant of pancreatic cancer, breast cancer status post lumpectomy and radiation, hypertension, hypothyroidism admitted s/p Whipple procedure.    PT Comments    Pt in bed upon PT arrival, agreeable only to limited PT session due to general discomfort. The pt was able to perform a few general strengthening exercises for BLE while in bed with good technique, and no reports of changes in pain. However, the pt stopped mid-exercise to state "I just can't" and then declined further exercise or mobility at this time. The pt will continue to benefit from skilled PT to progress mobility and strength during her hospital stay.     Follow Up Recommendations  SNF     Equipment Recommendations  None recommended by PT    Recommendations for Other Services       Precautions / Restrictions Precautions Precautions: Fall Precaution Comments: NG tube Restrictions Weight Bearing Restrictions: No    Mobility  Bed Mobility               General bed mobility comments: pt unwilling to attempt to sit EOB today despite education regarding importance of being upright and doing exercises while up.  Transfers Overall transfer level: (pt declined today due to reports of discomfort)               General transfer comment: declined  Ambulation/Gait                 Stairs             Wheelchair Mobility    Modified Rankin (Stroke Patients Only)       Balance Overall balance assessment: (pt declined to sit EOB today, unable to assess sitting or standing balance)                                          Cognition Arousal/Alertness: Awake/alert Behavior During Therapy: Anxious(pt is very particular) Overall Cognitive Status: Within Functional Limits for tasks  assessed                                 General Comments: pt very particular and anxious about all movement. Pt unwilling to attempt anything other than bed-level exercises, but is unable to clarify reason other than "I don't feel well"      Exercises General Exercises - Lower Extremity Short Arc Quad: Both;10 reps;Supine;Strengthening(2 x 10) Heel Slides: Both;10 reps;Supine;Strengthening(2 x 10) Hip ABduction/ADduction: AROM;Both;10 reps(pt refused 2nd set, reporting "I can't" after starting to move her leg)    General Comments General comments (skin integrity, edema, etc.): pt reporting she will transfer to chair with RN staff as soon as a cushion arrives, unwilling to sit EOB or transfer to chair with pillow placed while waiting for cushion.      Pertinent Vitals/Pain Pain Assessment: Faces Faces Pain Scale: Hurts little more Pain Location: abdomen Pain Descriptors / Indicators: Discomfort Pain Intervention(s): Limited activity within patient's tolerance;Repositioned;Monitored during session    Home Living                      Prior Function  PT Goals (current goals can now be found in the care plan section) Acute Rehab PT Goals Patient Stated Goal: go home PT Goal Formulation: With patient Time For Goal Achievement: 11/13/2019 Potential to Achieve Goals: Fair Progress towards PT goals: Progressing toward goals    Frequency    Min 2X/week      PT Plan Current plan remains appropriate    Co-evaluation              AM-PAC PT "6 Clicks" Mobility   Outcome Measure  Help needed turning from your back to your side while in a flat bed without using bedrails?: A Little Help needed moving from lying on your back to sitting on the side of a flat bed without using bedrails?: A Little Help needed moving to and from a bed to a chair (including a wheelchair)?: A Lot Help needed standing up from a chair using your arms (e.g.,  wheelchair or bedside chair)?: A Lot Help needed to walk in hospital room?: A Lot Help needed climbing 3-5 steps with a railing? : Total 6 Click Score: 13    End of Session   Activity Tolerance: Patient limited by fatigue;Other (comment)(pt unable to specifically state reason for limited mobility (pain, fatigue, nausea..?) but reports "I just don't feel good" and refused further therapy at this time.) Patient left: in bed;with call bell/phone within reach Nurse Communication: Mobility status(need to move pt to recliner upon arrival of cushion) PT Visit Diagnosis: Difficulty in walking, not elsewhere classified (R26.2);Muscle weakness (generalized) (M62.81)     Time: [id number] PT Time Calculation (min) (ACUTE ONLY): 14 min  Charges:  $Therapeutic Exercise: 8-22 mins                     Mickey Farber, PT, DPT   Acute Rehabilitation Department [phone]   Otho Bellows 10/28/2019, 11:25 AM

## 2019-10-28 NOTE — Progress Notes (Signed)
Subjective:     Repeat CT shows some improvement in swelling at [place].  No other significant change.     Objective:  Vital signs for last 24 hours: Temp:  [97.6 F (36.4 C)-97.9 F (36.6 C)] 97.6 F (36.4 C) (12/04 0416) Pulse Rate:  [94-101] 94 (12/04 0416) Resp:  [18-19] 18 (12/04 0416) BP: (116-137)/(64-68) 116/67 (12/04 0416) SpO2:  [94 %-95 %] 94 % (12/04 0416)  Intake/Output from previous day: 12/03 0701 - 12/04 0700 In: 737.2 [P.O.:120; I.V.:90.2; NG/GT:527] Out: 950 [Urine:750; Emesis/NG output:200]  Intake/Output this shift: No intake/output data recorded.  Vent settings for last 24 hours:    Physical Exam:  Gen: awake, NAD.   Neuro: no acute issues.   HEENT: NGT in place, bilious output. Output is down.   Pulm: unlabored breathing Abd: soft, NT, sl distended.  Anasarca improving. Extr: wwp   Results for orders placed or performed during the hospital encounter of 09/26/2019 (from the past 24 hour(s))  Glucose, capillary     Status: None   Collection Time: 10/27/19 12:48 PM  Result Value Ref Range   Glucose-Capillary 84 70 - 99 mg/dL  Glucose, capillary     Status: Abnormal   Collection Time: 10/27/19  4:13 PM  Result Value Ref Range   Glucose-Capillary 60 (L) 70 - 99 mg/dL  Glucose, capillary     Status: Abnormal   Collection Time: 10/27/19  7:56 PM  Result Value Ref Range   Glucose-Capillary 104 (H) 70 - 99 mg/dL  Glucose, capillary     Status: None   Collection Time: 10/28/19 12:02 AM  Result Value Ref Range   Glucose-Capillary 92 70 - 99 mg/dL  Glucose, capillary     Status: Abnormal   Collection Time: 10/28/19  4:18 AM  Result Value Ref Range   Glucose-Capillary 119 (H) 70 - 99 mg/dL  Glucose, capillary     Status: Abnormal   Collection Time: 10/28/19  8:07 AM  Result Value Ref Range   Glucose-Capillary 134 (H) 70 - 99 mg/dL    Assessment & Plan: Present on Admission:  . Adenocarcinoma of head of pancreas ([place]) H/o sepsis Protein  calorie malnutrition BRCA 2 H/o breast cancer Gastric outlet obstruction   LOS: 31 days   Additional comments:I reviewed the patient's new clinical lab test results.    Julie Rollins s/p Whipple  Pancreatic cancer - s/p Whipple by Dr. Barry Dienes 11/3.  ypT2N1 ABL anemia - stable Presumed delayed gastric emptying vs obstruction due to swelling at [place]- continue NGT to suction. May need venting G-tube. Stomach is close to abdominal wall. Will give a little more time.  May need surgical G tube if no other options are available.  Slight improvement in swelling at [place] on scan.  If no improvement over the weekend, will work on G tube.   FEN - vital AF tube feeds.  Hopefully this will improve nutritional status.   Additional lasix today.   Endo - hypoglycemia resolved.     DVT - LMWH, PT/OT Severe protein calorie malnutrition.  Prealbumin is 6.  Albumin 1.4. Recheck on Sunday.       Milus Height, MD FACS Surgical Oncology, General Surgery, Trauma and [place] [facility], [place] [phone] Check [email or web address] for coverage night/weekend   10/28/2019

## 2019-10-29 DIAGNOSIS — E876 Hypokalemia: Secondary | ICD-10-CM

## 2019-10-29 DIAGNOSIS — K3189 Other diseases of stomach and duodenum: Secondary | ICD-10-CM

## 2019-10-29 LAB — GLUCOSE, CAPILLARY
Glucose-Capillary: 105 mg/dL — ABNORMAL HIGH (ref 70–99)
Glucose-Capillary: 102 mg/dL — ABNORMAL HIGH (ref 70–99)
Glucose-Capillary: 117 mg/dL — ABNORMAL HIGH (ref 70–99)
Glucose-Capillary: 133 mg/dL — ABNORMAL HIGH (ref 70–99)
Glucose-Capillary: 84 mg/dL (ref 70–99)

## 2019-10-29 LAB — BASIC METABOLIC PANEL
Anion gap: 9 (ref 5–15)
BUN: 8 mg/dL (ref 8–23)
CO2: 28 mmol/L (ref 22–32)
Calcium: 8.2 mg/dL — ABNORMAL LOW (ref 8.9–10.3)
Chloride: 101 mmol/L (ref 98–111)
Creatinine, Ser: 0.48 mg/dL (ref 0.44–1.00)
GFR calc Af Amer: >60 mL/min (ref >60)
GFR calc non Af Amer: >60 mL/min (ref >60)
Glucose, Bld: 113 mg/dL — ABNORMAL HIGH (ref 70–99)
Potassium: 3.5 mmol/L (ref 3.5–5.1)
Sodium: 138 mmol/L (ref 135–145)

## 2019-10-29 LAB — MAGNESIUM: Magnesium: 1.6 mg/dL — ABNORMAL LOW (ref 1.7–2.4)

## 2019-10-29 LAB — CBC
HCT: 29.9 % — ABNORMAL LOW (ref 36.0–46.0)
Hemoglobin: 9.6 g/dL — ABNORMAL LOW (ref 12.0–15.0)
MCH: 30.8 pg (ref 26.0–34.0)
MCHC: 32.1 g/dL (ref 30.0–36.0)
MCV: 95.8 fL (ref 80.0–100.0)
Platelets: 344 10*3/uL (ref 150–400)
RBC: 3.12 MIL/uL — ABNORMAL LOW (ref 3.87–5.11)
RDW: 15.2 % (ref 11.5–15.5)
WBC: 5.3 10*3/uL (ref 4.0–10.5)
nRBC: 0 % (ref 0.0–0.2)

## 2019-10-29 MED ORDER — HYDROCORTISONE 1 % EX CREA
1.0000 "application " | TOPICAL_CREAM | Freq: Three times a day (TID) | CUTANEOUS | Status: DC | PRN
  Filled 2019-10-29: qty 28

## 2019-10-29 MED ORDER — MAGNESIUM SULFATE 4 GM/100ML IV SOLN
4.0000 g | Freq: Once | INTRAVENOUS | Status: AC
  Administered 2019-10-29: 10:00:00 4 g via INTRAVENOUS
  Filled 2019-10-29: qty 100

## 2019-10-29 MED ORDER — GUAIFENESIN-DM 100-10 MG/5ML PO SYRP: 10.0000 mL | ORAL_SOLUTION | ORAL | Status: DC | PRN

## 2019-10-29 MED ORDER — LIP MEDEX EX OINT
1.0000 "application " | TOPICAL_OINTMENT | Freq: Two times a day (BID) | CUTANEOUS | Status: DC
  Administered 2019-10-29 – 2019-11-09 (×19): 1 via TOPICAL
  Filled 2019-10-29: qty 7

## 2019-10-29 MED ORDER — HYDROCORTISONE (PERIANAL) 2.5 % EX CREA
1.0000 "application " | TOPICAL_CREAM | Freq: Four times a day (QID) | CUTANEOUS | Status: DC | PRN
  Filled 2019-10-29: qty 28.35

## 2019-10-29 MED ORDER — MENTHOL 3 MG MT LOZG: 1.0000 | LOZENGE | OROMUCOSAL | Status: DC | PRN

## 2019-10-29 MED ORDER — PANTOPRAZOLE SODIUM 40 MG PO PACK
40.0000 mg | PACK | Freq: Two times a day (BID) | ORAL | Status: DC
  Administered 2019-10-29 – 2019-11-08 (×15): 40 mg
  Filled 2019-10-29 (×26): qty 20

## 2019-10-29 MED ORDER — MAGIC MOUTHWASH
15.0000 mL | Freq: Four times a day (QID) | ORAL | Status: DC | PRN
  Filled 2019-10-29: qty 15

## 2019-10-29 MED ORDER — SODIUM CHLORIDE 0.9% FLUSH
10.0000 mL | Freq: Two times a day (BID) | INTRAVENOUS | Status: DC
  Administered 2019-10-29 – 2019-11-08 (×11): 10 mL

## 2019-10-29 MED ORDER — SODIUM CHLORIDE 0.9% FLUSH: 10.0000 mL | INTRAVENOUS | Status: DC | PRN

## 2019-10-29 MED ORDER — OMEPRAZOLE 2 MG/ML ORAL SUSPENSION: 20.0000 mg | Freq: Two times a day (BID) | ORAL | Status: DC

## 2019-10-29 MED ORDER — PHENOL 1.4 % MT LIQD: 1.0000 | OROMUCOSAL | Status: DC | PRN

## 2019-10-29 MED ORDER — OSMOLITE 1.5 CAL PO LIQD
1000.0000 mL | ORAL | Status: DC
  Administered 2019-10-29 – 2019-11-04 (×6): 1000 mL
  Filled 2019-10-29 (×9): qty 1000

## 2019-10-29 MED ORDER — SODIUM CHLORIDE 0.9 % IV SOLN
250.0000 mg | Freq: Three times a day (TID) | INTRAVENOUS | Status: DC
  Administered 2019-10-29 – 2019-11-01 (×9): 250 mg via INTRAVENOUS
  Filled 2019-10-29 (×13): qty 5

## 2019-10-29 MED ORDER — POTASSIUM CHLORIDE 20 MEQ/15ML (10%) PO SOLN
20.0000 meq | Freq: Two times a day (BID) | ORAL | Status: AC
  Administered 2019-10-29 – 2019-11-02 (×8): 20 meq
  Filled 2019-10-29 (×8): qty 15

## 2019-10-29 MED ORDER — ALUM & MAG HYDROXIDE-SIMETH 200-200-20 MG/5ML PO SUSP: 30.0000 mL | Freq: Four times a day (QID) | ORAL | Status: DC | PRN

## 2019-10-29 MED ORDER — FUROSEMIDE 10 MG/ML IJ SOLN
40.0000 mg | Freq: Once | INTRAMUSCULAR | Status: AC
  Administered 2019-10-29: 40 mg via INTRAVENOUS
  Filled 2019-10-29: qty 4

## 2019-10-29 MED ORDER — ALBUMIN HUMAN 5 % IV SOLN
12.5000 g | Freq: Four times a day (QID) | INTRAVENOUS | Status: AC | PRN
  Filled 2019-10-29: qty 250

## 2019-10-29 MED ORDER — ACETAMINOPHEN 650 MG RE SUPP: 650.0000 mg | Freq: Four times a day (QID) | RECTAL | Status: DC | PRN

## 2019-10-29 NOTE — Progress Notes (Addendum)
Julie Rollins [id number] 26-Oct-1946  CARE TEAM:  PCP: Nolene Ebbs, MD  Outpatient Care Team: Patient Care Team: Nolene Ebbs, MD as PCP - General (Internal Medicine) Stark Klein, MD as Consulting Physician (General Surgery) Truitt Merle, MD as Consulting Physician (Hematology) Alla Feeling, NP as Nurse Practitioner (Nurse Practitioner) Arna Snipe, RN as Oncology Nurse Navigator  Inpatient Treatment Team: Treatment Team: Attending Provider: Stark Klein, MD; Technician: Wylene Men, NT; Registered Nurse: Charisse March, RN; Technician: Sherald Barge, NT; Registered Nurse: Donzetta Matters, RN; Technician: Gasper Sells, [place]; Technician: Warden Fillers, NT; Registered Nurse: Suzan Nailer, RN; Technician: Wanda Plump, NT; Registered Nurse: Roselind Rily, RN; Occupational Therapist: Apickup-Ot, A, OT; Registered Nurse: Corinna Lines, RN; Registered Nurse: Theora Master, RN; Case Manager: Claudie Leach, RN   Problem List:   Principal Problem:   Carcinoma of head of pancreas s/p Whiplle pancreaticoduodenectomy 09/30/2019 Active Problems:   Generalized weakness   Hypothyroidism   BRCA2 gene mutation positive in female   Encounter for nasogastric (NG) tube placement   Protein-calorie malnutrition, severe ([place])   Acute esophagitis   Gastric ileus   Hypokalemia   Hypomagnesemia    10/04/2019  PREOPERATIVE DIAGNOSIS: adenocarcinoma of the pancreatic head, cT2N0, s/p neoadjuvant chemotherapy and stereotactic radiation.    POSTOPERATIVE DIAGNOSIS: Same.   PROCEDURES PERFORMED:  Diagnostic laparoscopy  Classic pancreaticoduodenectomy   Placement of pancreatic duct stent  16 Fr jejunostomy Feeding tube  SURGEON: Stark Klein, MD     Assessment  Present on Admission:   Adenocarcinoma of head of pancreas ([place]) H/o sepsis Protein calorie malnutrition BRCA 2 H/o breast cancer Gastric outlet obstruction   LOS:  31 days   Additional comments:I reviewed the patient's new clinical lab test results.    10F s/p Whipple  Pancreatic cancer - s/p Whipple by Dr. Barry Dienes 11/3.  ypT2N1  [facility] Stay = 32 days)  Plan:  Pancreatic cancer - s/p Whipple by Dr. Barry Dienes 11/3.  ypT2N1  ABL anemia - stable  Delayed gastric emptying vs obstruction due to swelling at [place]- continue NGT to suction. May need venting G-tube. Stomach is close to abdominal wall. Will give a little more time.    Not able to have endoscopic gastrostomy tube placed.  May need surgical G tube if no other options are available.  Slight improvement in swelling at [place] on scan.    Try NG tube clamping trials with low volume liquids as tolerated.  If she can tolerate her the weekend, consider removing advance diet.  If no improvement over the weekend, most likely patient will need surgical G tube.  Defer to Dr. Barry Dienes  Severe protein calorie malnutrition.  Prealbumin is 6.  Albumin 1.4. Recheck on Sunday.   Marland Kitchen   FEN -switch to different tube feeding formula with chronic diarrhea on vital.  Try Osmolite.  Lowered rate at 50 mL an hour.      Diuresis since area malnourished with chronic ascites.  Correct hypomagnesemia and hypokalemia.  Help follow in the setting of need for diuresis.  Additional lasix today.    Endo - hypoglycemia resolved.     Continue levothyroxine for hypothyroidism  Glucose control.  DVT - LMWH, PT/OT     -VTE prophylaxis- SCDs, etc  -mobilize as tolerated to help recovery  45 minutes spent in review, evaluation, examination, counseling, and coordination of care.  More than 50% of that time was spent in counseling.  10/29/2019  Subjective: (Chief complaint)  Sitting in bed.  Denies much nausea.  Think she has passed a little bit of gas.  Loose bowel movements better controlled with Imodium  Objective:  Vital signs:  Vitals:   10/28/19 2003 10/29/19 0401 10/29/19 0402 10/29/19 0500  BP: 127/61  (!) 127/57 (!) 125/59   Pulse: (!) 102 98 96   Resp: 20 18    Temp: 98.4 F (36.9 C) 97.9 F (36.6 C)    TempSrc: Oral Oral    SpO2: 95% 93%    Weight:    73 kg  Height:        Last BM Date: 10/27/19  Intake/Output   Yesterday:  12/04 0701 - 12/05 0700 In: 1123.2 [P.O.:240; I.V.:343.2; NG/GT:540] Out: 1670 [Urine:1200; Emesis/NG output:470] This shift:  No intake/output data recorded.  Bowel function:  Flatus: No  BM:  YES  Drain: (No drain)   Physical Exam:  General: Pt awake/alert/oriented x4 in no acute distress Eyes: PERRL, normal EOM.  Sclera clear.  No icterus Neuro: CN II-XII intact w/o focal sensory/motor deficits. Lymph: No head/neck/groin lymphadenopathy Psych:  No delerium/psychosis/paranoia HENT: Normocephalic, Mucus membranes moist.  No thrush Neck: Supple, No tracheal deviation Chest: No chest wall pain w good excursion CV:  Pulses intact.  Regular rhythm MS: Normal AROM mjr joints.  No obvious deformity  Abdomen: Soft.  Moderately distended.  Nontender.  No evidence of peritonitis.  No incarcerated hernias.  Ext:  No deformity.  No mjr edema.  No cyanosis Skin: No petechiae / purpura  Results:   Cultures: No results found for this or any previous visit (from the past 720 hour(s)).  Labs: Results for orders placed or performed during the hospital encounter of 10/01/2019 (from the past 48 hour(s))  Glucose, capillary     Status: None   Collection Time: 10/27/19 12:48 PM  Result Value Ref Range   Glucose-Capillary 84 70 - 99 mg/dL  Glucose, capillary     Status: Abnormal   Collection Time: 10/27/19  4:13 PM  Result Value Ref Range   Glucose-Capillary 60 (L) 70 - 99 mg/dL  Glucose, capillary     Status: Abnormal   Collection Time: 10/27/19  7:56 PM  Result Value Ref Range   Glucose-Capillary 104 (H) 70 - 99 mg/dL  Glucose, capillary     Status: None   Collection Time: 10/28/19 12:02 AM  Result Value Ref Range   Glucose-Capillary 92 70 - 99  mg/dL  Glucose, capillary     Status: Abnormal   Collection Time: 10/28/19  4:18 AM  Result Value Ref Range   Glucose-Capillary 119 (H) 70 - 99 mg/dL  Glucose, capillary     Status: Abnormal   Collection Time: 10/28/19  8:07 AM  Result Value Ref Range   Glucose-Capillary 134 (H) 70 - 99 mg/dL  Glucose, capillary     Status: None   Collection Time: 10/28/19 12:24 PM  Result Value Ref Range   Glucose-Capillary 99 70 - 99 mg/dL  Glucose, capillary     Status: Abnormal   Collection Time: 10/28/19  3:59 PM  Result Value Ref Range   Glucose-Capillary 126 (H) 70 - 99 mg/dL  Glucose, capillary     Status: None   Collection Time: 10/28/19  7:53 PM  Result Value Ref Range   Glucose-Capillary 95 70 - 99 mg/dL  Glucose, capillary     Status: Abnormal   Collection Time: 10/28/19 11:53 PM  Result Value Ref Range   Glucose-Capillary 102 (  H) 70 - 99 mg/dL  CBC     Status: Abnormal   Collection Time: 10/29/19  3:33 AM  Result Value Ref Range   WBC 5.3 4.0 - 10.5 K/uL   RBC 3.12 (L) 3.87 - 5.11 MIL/uL   Hemoglobin 9.6 (L) 12.0 - 15.0 g/dL   HCT 29.9 (L) 36.0 - 46.0 %   MCV 95.8 80.0 - 100.0 fL   MCH 30.8 26.0 - 34.0 pg   MCHC 32.1 30.0 - 36.0 g/dL   RDW 15.2 11.5 - 15.5 %   Platelets 344 150 - 400 K/uL   nRBC 0.0 0.0 - 0.2 %    Comment: Performed at [facility], [place] [address] [place], [place] [address]  Magnesium     Status: Abnormal   Collection Time: 10/29/19  3:33 AM  Result Value Ref Range   Magnesium 1.6 (L) 1.7 - 2.4 mg/dL    Comment: Performed at [facility] [address], [place], [place] [address]  Basic metabolic panel     Status: Abnormal   Collection Time: 10/29/19  3:33 AM  Result Value Ref Range   Sodium 138 135 - 145 mmol/L   Potassium 3.5 3.5 - 5.1 mmol/L   Chloride 101 98 - 111 mmol/L   CO2 28 22 - 32 mmol/L   Glucose, Bld 113 (H) 70 - 99 mg/dL   BUN 8 8 - 23 mg/dL   Creatinine, Ser 0.48 0.44 - 1.00 mg/dL   Calcium 8.2 (L) 8.9 - 10.3 mg/dL   GFR  calc non Af Amer >60 >60 mL/min   GFR calc Af Amer >60 >60 mL/min   Anion gap 9 5 - 15    Comment: Performed at [facility], [place] [address] [place], [place] [address]  Glucose, capillary     Status: Abnormal   Collection Time: 10/29/19  3:56 AM  Result Value Ref Range   Glucose-Capillary 105 (H) 70 - 99 mg/dL  Glucose, capillary     Status: Abnormal   Collection Time: 10/29/19  7:38 AM  Result Value Ref Range   Glucose-Capillary 102 (H) 70 - 99 mg/dL    Imaging / Studies: Ct Abdomen Pelvis Wo Contrast  Result Date: 10/27/2019 CLINICAL DATA:  Status post Whipple. Abdominal distension. Evaluate GI in level of ascites. EXAM: CT ABDOMEN AND PELVIS WITHOUT CONTRAST TECHNIQUE: Multidetector CT imaging of the abdomen and pelvis was performed following the standard protocol without IV contrast. COMPARISON:  Abdominal CT 10/17/2019 FINDINGS: Lower chest: Slight decrease in left but increase and right pleural effusion, small to moderate. Adjacent compressive atelectasis in both lower lobes. Heart is normal in size. Central line in the atrial caval junction. There is enteric contrast within the distal esophagus which is dilated. Enteric tube is in place. Hepatobiliary: No focal hepatic abnormality allowing for lack of contrast. Gallbladder surgically absent. No biliary dilatation. Pancreas: Post Whipple procedure. Pancreatic duct remains in place. Atrophic pancreatic tail. Spleen: Normal in size without focal abnormality. Adrenals/Urinary Tract: Normal adrenal glands. No hydronephrosis or perinephric edema. Urinary bladder is physiologically distended. No bladder wall thickening. Stomach/Bowel: Enteric tube tip in the stomach, in the region of the gastrojejunostomy anastomosis. There is enteric contrast in the stomach as well as distal esophagus. Wall thickening about the gastric cardia. Post distal gastrectomy and gastrojejunostomy. Previous duodenal wall thickening and edema has resolved, decreased  enteric contrast in the duodenum with mild residual. The efferent limb is unremarkable. No bowel obstruction, administered enteric  contrast reaches the colon. Jejunostomy tube remains in place. Questionable wall thickening at the hepatic flexure of the colon series 3, image 48, not well assessed due to adjacent ascites. No obvious colonic inflammation. Small to moderate colonic stool burden. Diverticulosis of the distal colon without diverticulitis. There is stool in the rectum. Vascular/Lymphatic: Normal caliber abdominal aorta. No evidence of adenopathy allowing for noncontrast exam. Reproductive: Post hysterectomy. No adnexal mass. Other: Moderate volume abdominopelvic ascites. Generalized body wall edema. Midline skin staples have been removed since prior exam. Small umbilical hernia contains ascites. No free air. No loculated fluid collection. Musculoskeletal: There are no acute or suspicious osseous abnormalities. Chronic T12 compression fracture unchanged. IMPRESSION: 1. Moderate volume of abdominopelvic ascites, appearing similar to prior CT. 2. Postoperative Whipple procedure. The stomach and lower esophagus remain contrast filled and dilated, similar to prior exam. Contrast traverses the efferent limb without evidence of mechanical obstruction. There is new wall thickening about the gastric cardia. 3. Bilateral pleural effusions, slightly decreased on the left and increased on the right from prior CT. 4. Possible wall thickening at the hepatic flexure of the colon versus adjacent ascites. 5. Additional chronic and incidental findings are stable as described. Electronically Signed   By: Keith Rake M.D.   On: 10/27/2019 16:31    Medications / Allergies: per chart  Antibiotics: Anti-infectives (From admission, onward)   Start     Dose/Rate Route Frequency Ordered Stop   10/02/19 1858  vancomycin (VANCOCIN) IVPB 1000 mg/200 mL premix     1,000 mg 200 mL/hr over 60 Minutes Intravenous Every 12  hours 10/02/19 1533 10/05/19 1944   09/29/19 0630  vancomycin (VANCOCIN) IVPB 1000 mg/200 mL premix  Status:  Discontinued     1,000 mg 200 mL/hr over 60 Minutes Intravenous Every 24 hours 09/29/19 0625 10/02/19 1533   09/29/19 0615  ceFEPIme (MAXIPIME) 2 g in sodium chloride 0.9 % 100 mL IVPB     2 g 200 mL/hr over 30 Minutes Intravenous Every 12 hours 09/29/19 0605 10/05/19 2300   10/20/2019 2200  ceFAZolin (ANCEF) IVPB 2g/100 mL premix     2 g 200 mL/hr over 30 Minutes Intravenous Every 8 hours 10/18/2019 1523 09/28/19 1900   10/10/2019 0600  ceFAZolin (ANCEF) IVPB 2g/100 mL premix     2 g 200 mL/hr over 30 Minutes Intravenous On call to O.R. 09/29/2019 0550 10/23/2019 1338   10/23/2019 0558  ceFAZolin (ANCEF) 2-4 GM/100ML-% IVPB    Note to Pharmacy: Marga Melnick   : cabinet override      10/11/2019 0558 10/16/2019 1814        Note: Portions of this report may have been transcribed using voice recognition software. Every effort was made to ensure accuracy; however, inadvertent computerized transcription errors may be present.   Any transcriptional errors that result from this process are unintentional.     Adin Hector, MD, FACS, MASCRS Gastrointestinal and Minimally Invasive Surgery    [address], [place], [place] [address] [phone] Main / Paging [phone] Fax

## 2019-10-30 ENCOUNTER — Inpatient Hospital Stay (HOSPITAL_COMMUNITY): Payer: Medicare HMO

## 2019-10-30 DIAGNOSIS — E440 Moderate protein-calorie malnutrition: Secondary | ICD-10-CM | POA: Insufficient documentation

## 2019-10-30 LAB — COMPREHENSIVE METABOLIC PANEL
ALT: 27 U/L (ref 0–44)
AST: 24 U/L (ref 15–41)
Albumin: 1.6 g/dL — ABNORMAL LOW (ref 3.5–5.0)
Alkaline Phosphatase: 192 U/L — ABNORMAL HIGH (ref 38–126)
Anion gap: 6 (ref 5–15)
BUN: 9 mg/dL (ref 8–23)
CO2: 29 mmol/L (ref 22–32)
Calcium: 7.7 mg/dL — ABNORMAL LOW (ref 8.9–10.3)
Chloride: 103 mmol/L (ref 98–111)
Creatinine, Ser: 0.47 mg/dL (ref 0.44–1.00)
GFR calc Af Amer: >60 mL/min (ref >60)
GFR calc non Af Amer: >60 mL/min (ref >60)
Glucose, Bld: 119 mg/dL — ABNORMAL HIGH (ref 70–99)
Potassium: 3.8 mmol/L (ref 3.5–5.1)
Sodium: 138 mmol/L (ref 135–145)
Total Bilirubin: 0.4 mg/dL (ref 0.3–1.2)
Total Protein: 4.8 g/dL — ABNORMAL LOW (ref 6.5–8.1)

## 2019-10-30 LAB — GLUCOSE, CAPILLARY
Glucose-Capillary: 108 mg/dL — ABNORMAL HIGH (ref 70–99)
Glucose-Capillary: 109 mg/dL — ABNORMAL HIGH (ref 70–99)
Glucose-Capillary: 117 mg/dL — ABNORMAL HIGH (ref 70–99)
Glucose-Capillary: 120 mg/dL — ABNORMAL HIGH (ref 70–99)
Glucose-Capillary: 131 mg/dL — ABNORMAL HIGH (ref 70–99)
Glucose-Capillary: 139 mg/dL — ABNORMAL HIGH (ref 70–99)

## 2019-10-30 LAB — PREALBUMIN: Prealbumin: 16.8 mg/dL — ABNORMAL LOW (ref 18–38)

## 2019-10-30 MED ORDER — LEVOTHYROXINE SODIUM 50 MCG PO TABS
50.0000 ug | ORAL_TABLET | Freq: Every day | ORAL | Status: DC
  Administered 2019-10-31 – 2019-11-09 (×6): 50 ug
  Filled 2019-10-30 (×7): qty 1

## 2019-10-30 MED ORDER — LOPERAMIDE HCL 1 MG/7.5ML PO SUSP
2.0000 mg | Freq: Every day | ORAL | Status: DC
  Administered 2019-11-02 – 2019-11-07 (×6): 2 mg via ORAL
  Filled 2019-10-30 (×10): qty 15

## 2019-10-30 MED ORDER — LEVOTHYROXINE SODIUM 25 MCG/ML PO SOLN: 50.0000 ug | Freq: Every day | ORAL | Status: DC

## 2019-10-30 MED ORDER — FUROSEMIDE 10 MG/ML IJ SOLN
40.0000 mg | Freq: Once | INTRAMUSCULAR | Status: AC
  Administered 2019-10-30: 40 mg via INTRAVENOUS
  Filled 2019-10-30: qty 4

## 2019-10-30 NOTE — Progress Notes (Signed)
ZARAYA DELAUDER [id number] 1946/05/17  CARE TEAM:  PCP: Nolene Ebbs, MD  Outpatient Care Team: Patient Care Team: Nolene Ebbs, MD as PCP - General (Internal Medicine) Stark Klein, MD as Consulting Physician (General Surgery) Truitt Merle, MD as Consulting Physician (Hematology) Alla Feeling, NP as Nurse Practitioner (Nurse Practitioner) Arna Snipe, RN as Oncology Nurse Navigator  Inpatient Treatment Team: Treatment Team: Attending Provider: Stark Klein, MD; Technician: Wylene Men, NT; Registered Nurse: Charisse March, RN; Technician: Sherald Barge, NT; Registered Nurse: Donzetta Matters, RN; Technician: Gasper Sells, NT; Technician: Warden Fillers, NT; Registered Nurse: Suzan Nailer, RN; Technician: Wanda Plump, NT; Occupational Therapist: Apickup-Ot, A, OT; Registered Nurse: Corinna Lines, RN; Respiratory Therapist: Judith Part, RRT; Case Manager: Claudie Leach, RN   Problem List:   Principal Problem:   Carcinoma of head of pancreas s/p Whiplle pancreaticoduodenectomy 10/22/2019 Active Problems:   Generalized weakness   Hypothyroidism   BRCA2 gene mutation positive in female   Encounter for nasogastric (NG) tube placement   Protein-calorie malnutrition, severe ([place])   Acute esophagitis   Gastric ileus   Hypokalemia   Hypomagnesemia   Malnutrition of moderate degree    10/05/2019  PREOPERATIVE DIAGNOSIS: adenocarcinoma of the pancreatic head, cT2N0, s/p neoadjuvant chemotherapy and stereotactic radiation.    POSTOPERATIVE DIAGNOSIS: Same.   PROCEDURES PERFORMED:  Diagnostic laparoscopy  Classic pancreaticoduodenectomy   Placement of pancreatic duct stent  16 Fr jejunostomy Feeding tube  SURGEON: Stark Klein, MD     Assessment  Present on Admission:  . Adenocarcinoma of head of pancreas ([place]) H/o sepsis Protein calorie malnutrition BRCA 2 H/o breast cancer Gastric outlet obstruction   LOS: 31 days    Additional comments:I reviewed the patient's new clinical lab test results.    48F s/p Whipple  Pancreatic cancer - s/p Whipple by Dr. Barry Dienes 11/3.  ypT2N1  [facility] Stay = 33 days)  Plan:  Pancreatic cancer - s/p Whipple by Dr. Barry Dienes 11/3.  ypT2N1  ABL anemia - stable  Delayed gastric emptying vs obstruction due to swelling at [place]- return  NGT to suction.  X-ray to check positioning to make sure it is in appropriate location.  Most likely needs venting G-tube. Not able to have endoscopic gastrostomy tube placed.  Surgical G tube this coming week if ileus persists.   Defer to Dr. Barry Dienes  Severe protein calorie malnutrition.   Less diarrhea on Osmolite.  Back off on Imodium.  Diuresis since area malnourished with chronic ascites.  Additional lasix today.    Correct hypomagnesemia and hypokalemia.  Help follow in the setting of need for diuresis.  No cardiac events.  DC telemetry.  Endo - hypoglycemia resolved. Continue levothyroxine for hypothyroidism -give meds through jejunostomy tube his stomach is not working  Glucose control.  DVT - LMWH, PT/OT     -VTE prophylaxis- SCDs, etc  -mobilize as tolerated to help recovery  45 minutes spent in review, evaluation, examination, counseling, and coordination of care.  More than 50% of that time was spent in counseling.  I updated the patient's status to the patient and nurse.  Recommendations were made.  Questions were answered.  They expressed understanding & appreciation.   10/30/2019    Subjective: (Chief complaint)  Sitting in bed.  Denies much nausea.  Think she has passed a little bit of gas.  Loose bowel movements better controlled with Imodium  Objective:  Vital signs:  Vitals:   10/29/19 1440 10/29/19 2033  10/30/19 0444 10/30/19 0500  BP: (!) 125/59 (!) 118/58 (!) 112/58   Pulse: 93 98 90   Resp: (!) _0 Temp: (!) 97.4 F (36.3 C) 98.1 F (36.7 C) 97.8 F (36.6 C)   TempSrc: Oral Oral  Oral   SpO2: 96% 95% 92%   Weight:    73.4 kg  Height:        Last BM Date: 10/29/19  Intake/Output   Yesterday:  12/05 0701 - 12/06 0700 In: 454.8 [P.O.:150; I.V.:74.8; IV Piggyback:200] Out: 810 [Urine:700; Emesis/NG output:110] This shift:  No intake/output data recorded.  Bowel function:  Flatus: No  BM:  YES  Drain: (No drain)   Physical Exam:  General: Pt awake/alert/oriented x4 in no acute distress Eyes: PERRL, normal EOM.  Sclera clear.  No icterus Neuro: CN II-XII intact w/o focal sensory/motor deficits. Lymph: No head/neck/groin lymphadenopathy Psych:  No delerium/psychosis/paranoia HENT: Normocephalic, Mucus membranes moist.  No thrush Neck: Supple, No tracheal deviation Chest: No chest wall pain w good excursion CV:  Pulses intact.  Regular rhythm MS: Normal AROM mjr joints.  No obvious deformity  Abdomen: Soft.  Moderately distended.  Nontender.  No evidence of peritonitis.  No incarcerated hernias.  Ext:  No deformity.  No mjr edema.  No cyanosis Skin: No petechiae / purpura  Results:   Cultures: No results found for this or any previous visit (from the past 720 hour(s)).  Labs: Results for orders placed or performed during the hospital encounter of 10/11/2019 (from the past 48 hour(s))  Glucose, capillary     Status: None   Collection Time: 10/28/19 12:24 PM  Result Value Ref Range   Glucose-Capillary 99 70 - 99 mg/dL  Glucose, capillary     Status: Abnormal   Collection Time: 10/28/19  3:59 PM  Result Value Ref Range   Glucose-Capillary 126 (H) 70 - 99 mg/dL  Glucose, capillary     Status: None   Collection Time: 10/28/19  7:53 PM  Result Value Ref Range   Glucose-Capillary 95 70 - 99 mg/dL  Glucose, capillary     Status: Abnormal   Collection Time: 10/28/19 11:53 PM  Result Value Ref Range   Glucose-Capillary 102 (H) 70 - 99 mg/dL  CBC     Status: Abnormal   Collection Time: 10/29/19  3:33 AM  Result Value Ref Range   WBC 5.3 4.0 -  10.5 K/uL   RBC 3.12 (L) 3.87 - 5.11 MIL/uL   Hemoglobin 9.6 (L) 12.0 - 15.0 g/dL   HCT 29.9 (L) 36.0 - 46.0 %   MCV 95.8 80.0 - 100.0 fL   MCH 30.8 26.0 - 34.0 pg   MCHC 32.1 30.0 - 36.0 g/dL   RDW 15.2 11.5 - 15.5 %   Platelets 344 150 - 400 K/uL   nRBC 0.0 0.0 - 0.2 %    Comment: Performed at [facility], [place] [address] [place], [place] [address]  Magnesium     Status: Abnormal   Collection Time: 10/29/19  3:33 AM  Result Value Ref Range   Magnesium 1.6 (L) 1.7 - 2.4 mg/dL    Comment: Performed at [facility] [address], [place], [place] [address]  Basic metabolic panel     Status: Abnormal   Collection Time: 10/29/19  3:33 AM  Result Value Ref Range   Sodium 138 135 - 145 mmol/L   Potassium 3.5 3.5 - 5.1 mmol/L   Chloride 101 98 -  111 mmol/L   CO2 28 22 - 32 mmol/L   Glucose, Bld 113 (H) 70 - 99 mg/dL   BUN 8 8 - 23 mg/dL   Creatinine, Ser 0.48 0.44 - 1.00 mg/dL   Calcium 8.2 (L) 8.9 - 10.3 mg/dL   GFR calc non Af Amer >60 >60 mL/min   GFR calc Af Amer >60 >60 mL/min   Anion gap 9 5 - 15    Comment: Performed at [facility] [address] [place], [place] [address]  Glucose, capillary     Status: Abnormal   Collection Time: 10/29/19  3:56 AM  Result Value Ref Range   Glucose-Capillary 105 (H) 70 - 99 mg/dL  Glucose, capillary     Status: Abnormal   Collection Time: 10/29/19  7:38 AM  Result Value Ref Range   Glucose-Capillary 102 (H) 70 - 99 mg/dL  Glucose, capillary     Status: Abnormal   Collection Time: 10/29/19 11:49 AM  Result Value Ref Range   Glucose-Capillary 117 (H) 70 - 99 mg/dL  Glucose, capillary     Status: Abnormal   Collection Time: 10/29/19  3:52 PM  Result Value Ref Range   Glucose-Capillary 133 (H) 70 - 99 mg/dL  Glucose, capillary     Status: None   Collection Time: 10/29/19  8:31 PM  Result Value Ref Range   Glucose-Capillary 84 70 - 99 mg/dL  Glucose, capillary     Status: Abnormal   Collection Time: 10/30/19  12:32 AM  Result Value Ref Range   Glucose-Capillary 139 (H) 70 - 99 mg/dL  Comprehensive metabolic panel     Status: Abnormal   Collection Time: 10/30/19  3:48 AM  Result Value Ref Range   Sodium 138 135 - 145 mmol/L   Potassium 3.8 3.5 - 5.1 mmol/L   Chloride 103 98 - 111 mmol/L   CO2 29 22 - 32 mmol/L   Glucose, Bld 119 (H) 70 - 99 mg/dL   BUN 9 8 - 23 mg/dL   Creatinine, Ser 0.47 0.44 - 1.00 mg/dL   Calcium 7.7 (L) 8.9 - 10.3 mg/dL   Total Protein 4.8 (L) 6.5 - 8.1 g/dL   Albumin 1.6 (L) 3.5 - 5.0 g/dL   AST 24 15 - 41 U/L   ALT 27 0 - 44 U/L   Alkaline Phosphatase 192 (H) 38 - 126 U/L   Total Bilirubin 0.4 0.3 - 1.2 mg/dL   GFR calc non Af Amer >60 >60 mL/min   GFR calc Af Amer >60 >60 mL/min   Anion gap 6 5 - 15    Comment: Performed at [facility], [address] [place], [place] [address]  Glucose, capillary     Status: Abnormal   Collection Time: 10/30/19  4:40 AM  Result Value Ref Range   Glucose-Capillary 109 (H) 70 - 99 mg/dL  Glucose, capillary     Status: Abnormal   Collection Time: 10/30/19  7:45 AM  Result Value Ref Range   Glucose-Capillary 120 (H) 70 - 99 mg/dL    Imaging / Studies: No results found.  Medications / Allergies: per chart  Antibiotics: Anti-infectives (From admission, onward)   Start     Dose/Rate Route Frequency Ordered Stop   10/29/19 0945  erythromycin 250 mg in sodium chloride 0.9 % 100 mL IVPB     250 mg 100 mL/hr over 60 Minutes Intravenous Every 8 hours 10/29/19 0936     10/02/19 1858  vancomycin (VANCOCIN)  IVPB 1000 mg/200 mL premix     1,000 mg 200 mL/hr over 60 Minutes Intravenous Every 12 hours 10/02/19 1533 10/05/19 1944   09/29/19 0630  vancomycin (VANCOCIN) IVPB 1000 mg/200 mL premix  Status:  Discontinued     1,000 mg 200 mL/hr over 60 Minutes Intravenous Every 24 hours 09/29/19 0625 10/02/19 1533   09/29/19 0615  ceFEPIme (MAXIPIME) 2 g in sodium chloride 0.9 % 100 mL IVPB     2 g 200 mL/hr over 30 Minutes  Intravenous Every 12 hours 09/29/19 0605 10/05/19 2300   10/09/2019 2200  ceFAZolin (ANCEF) IVPB 2g/100 mL premix     2 g 200 mL/hr over 30 Minutes Intravenous Every 8 hours 10/06/2019 1523 09/28/19 1900   09/29/2019 0600  ceFAZolin (ANCEF) IVPB 2g/100 mL premix     2 g 200 mL/hr over 30 Minutes Intravenous On call to O.R. 10/15/2019 0550 10/24/2019 1338   09/26/2019 0558  ceFAZolin (ANCEF) 2-4 GM/100ML-% IVPB    Note to Pharmacy: Marga Melnick   : cabinet override      10/14/2019 0558 10/03/2019 1814        Note: Portions of this report may have been transcribed using voice recognition software. Every effort was made to ensure accuracy; however, inadvertent computerized transcription errors may be present.   Any transcriptional errors that result from this process are unintentional.     Adin Hector, MD, FACS, MASCRS Gastrointestinal and Minimally Invasive Surgery    [address] [place], [place] [address] [phone] Main / Paging [phone] Fax

## 2019-10-30 NOTE — Progress Notes (Signed)
Pt c/o nausea.  NGT hooked back up to low intermittent wall suction and Zofran 4mg  IV given.  Will continue to monitor.  [name]

## 2019-10-30 NOTE — Progress Notes (Signed)
Xray showed that NGT may need to be advanced.  This RN noted that the previous marking for placement on the NGT was not in the proper place.  Advanced NGT up to previous marking and verified placement with air.  MD notified and stated he did not want another xray.    Julie Rollins

## 2019-10-30 NOTE — Plan of Care (Signed)
  Problem: Education: Goal: Knowledge of General Education information will improve Description: Including pain rating scale, medication(s)/side effects and non-pharmacologic comfort measures Outcome: Progressing   Problem: Health Behavior/Discharge Planning: Goal: Ability to manage health-related needs will improve Outcome: Progressing   Problem: Clinical Measurements: Goal: Ability to maintain clinical measurements within normal limits will improve Outcome: Progressing Goal: Will remain free from infection Outcome: Progressing Goal: Diagnostic test results will improve Outcome: Progressing Goal: Respiratory complications will improve Outcome: Progressing Goal: Cardiovascular complication will be avoided Outcome: Progressing   Problem: Activity: Goal: Risk for activity intolerance will decrease Outcome: Progressing   Problem: Nutrition: Goal: Adequate nutrition will be maintained Outcome: Progressing   Problem: Coping: Goal: Level of anxiety will decrease Outcome: Progressing   Problem: Elimination: Goal: Will not experience complications related to bowel motility Outcome: Progressing Goal: Will not experience complications related to urinary retention Outcome: Progressing   Problem: Pain Managment: Goal: General experience of comfort will improve Outcome: Progressing   Problem: Safety: Goal: Ability to remain free from injury will improve Outcome: Progressing   Problem: Skin Integrity: Goal: Risk for impaired skin integrity will decrease Outcome: Progressing   Problem: Education: Goal: Will demonstrate proper wound care and an understanding of methods to prevent future damage Outcome: Progressing   Problem: Bowel/Gastric: Goal: Gastrointestinal status for postoperative course will improve Outcome: Progressing   Problem: Nutritional: Goal: Will attain and maintain optimal nutritional status will improve Outcome: Progressing   Problem: Clinical  Measurements: Goal: Postoperative complications will be avoided or minimized Outcome: Progressing   Problem: Respiratory: Goal: Will regain and/or maintain adequate ventilation Outcome: Progressing   Problem: Skin Integrity: Goal: Wound healing without signs and symptoms of infection will improve Outcome: Progressing Goal: Risk for impaired skin integrity will decrease Outcome: Progressing   Problem: Tissue Perfusion: Goal: Risk of venous thrombosis will decrease Outcome: Progressing   Problem: Urinary Elimination: Goal: Ability to achieve and maintain adequate urine output will improve Outcome: Progressing

## 2019-10-31 ENCOUNTER — Telehealth: Payer: Self-pay | Admitting: Hematology

## 2019-10-31 LAB — GLUCOSE, CAPILLARY
Glucose-Capillary: 111 mg/dL — ABNORMAL HIGH (ref 70–99)
Glucose-Capillary: 114 mg/dL — ABNORMAL HIGH (ref 70–99)
Glucose-Capillary: 116 mg/dL — ABNORMAL HIGH (ref 70–99)
Glucose-Capillary: 117 mg/dL — ABNORMAL HIGH (ref 70–99)
Glucose-Capillary: 60 mg/dL — ABNORMAL LOW (ref 70–99)
Glucose-Capillary: 89 mg/dL (ref 70–99)
Glucose-Capillary: 96 mg/dL (ref 70–99)

## 2019-10-31 LAB — CBC WITH DIFFERENTIAL/PLATELET
Abs Immature Granulocytes: 0.02 10*3/uL (ref 0.00–0.07)
Basophils Absolute: 0 10*3/uL (ref 0.0–0.1)
Basophils Relative: 1 %
Eosinophils Absolute: 0.5 10*3/uL (ref 0.0–0.5)
Eosinophils Relative: 9 %
HCT: 29.7 % — ABNORMAL LOW (ref 36.0–46.0)
Hemoglobin: 9.3 g/dL — ABNORMAL LOW (ref 12.0–15.0)
Immature Granulocytes: 0 %
Lymphocytes Relative: 15 %
Lymphs Abs: 0.8 10*3/uL (ref 0.7–4.0)
MCH: 30.1 pg (ref 26.0–34.0)
MCHC: 31.3 g/dL (ref 30.0–36.0)
MCV: 96.1 fL (ref 80.0–100.0)
Monocytes Absolute: 0.5 10*3/uL (ref 0.1–1.0)
Monocytes Relative: 10 %
Neutro Abs: 3.5 10*3/uL (ref 1.7–7.7)
Neutrophils Relative %: 65 %
Platelets: 357 10*3/uL (ref 150–400)
RBC: 3.09 MIL/uL — ABNORMAL LOW (ref 3.87–5.11)
RDW: 15.4 % (ref 11.5–15.5)
WBC: 5.3 10*3/uL (ref 4.0–10.5)
nRBC: 0 % (ref 0.0–0.2)

## 2019-10-31 LAB — PHOSPHORUS: Phosphorus: 3.3 mg/dL (ref 2.5–4.6)

## 2019-10-31 LAB — MAGNESIUM: Magnesium: 2.1 mg/dL (ref 1.7–2.4)

## 2019-10-31 MED ORDER — DEXTROSE 50 % IV SOLN: 25.0000 mL | Freq: Once | INTRAVENOUS | Status: DC

## 2019-10-31 MED ORDER — DEXTROSE 50 % IV SOLN
INTRAVENOUS | Status: AC
  Administered 2019-11-01: 50 mL
  Filled 2019-10-31: qty 50

## 2019-10-31 MED ORDER — PRO-STAT SUGAR FREE PO LIQD
30.0000 mL | Freq: Two times a day (BID) | ORAL | Status: DC
  Administered 2019-11-01 – 2019-11-08 (×13): 30 mL
  Filled 2019-10-31 (×16): qty 30

## 2019-10-31 NOTE — Telephone Encounter (Signed)
Cancelled appt per 12/05 sch message - unable to reach pt at both phones  . Left message on mobile phone

## 2019-10-31 NOTE — Care Management Important Message (Signed)
Important Message  Patient Details  Name: Julie Rollins MRN: [id number] Date of Birth: [birth year]   Medicare Important Message Given:  Yes     Memory Argue 10/31/2019, 3:25 PM

## 2019-10-31 NOTE — Progress Notes (Signed)
Subjective:     Pt continues to have issues with high ngt output.      Objective:  Vital signs for last 24 hours: Temp:  [97.8 F (36.6 C)-98.2 F (36.8 C)] 97.8 F (36.6 C) (12/07 0434) Pulse Rate:  [91-97] 91 (12/07 0434) Resp:  [14-15] 15 (12/07 0434) BP: (116-128)/(55-59) 118/56 (12/07 0434) SpO2:  [93 %-97 %] 93 % (12/07 0434) Weight:  [73.5 kg] 73.5 kg (12/07 0453)  Intake/Output from previous day: 12/06 0701 - 12/07 0700 In: 0  Out: 1450 [Urine:550; Emesis/NG output:900]  Intake/Output this shift: No intake/output data recorded.  Vent settings for last 24 hours:    Physical Exam:  Gen: awake, NAD.   Neuro: no acute issues.   HEENT: NGT in place, bilious output Pulm: unlabored breathing Abd: soft, NT, sl distended.  Anasarca improving. Extr: wwp   Results for orders placed or performed during the hospital encounter of 09/26/2019 (from the past 24 hour(s))  Glucose, capillary     Status: Abnormal   Collection Time: 10/30/19 11:52 AM  Result Value Ref Range   Glucose-Capillary 131 (H) 70 - 99 mg/dL  Prealbumin     Status: Abnormal   Collection Time: 10/30/19 12:37 PM  Result Value Ref Range   Prealbumin 16.8 (L) 18 - 38 mg/dL  Glucose, capillary     Status: Abnormal   Collection Time: 10/30/19  4:29 PM  Result Value Ref Range   Glucose-Capillary 117 (H) 70 - 99 mg/dL  Glucose, capillary     Status: Abnormal   Collection Time: 10/30/19  8:03 PM  Result Value Ref Range   Glucose-Capillary 108 (H) 70 - 99 mg/dL  CBC with Differential     Status: Abnormal   Collection Time: 10/30/19 11:51 PM  Result Value Ref Range   WBC 5.3 4.0 - 10.5 K/uL   RBC 3.09 (L) 3.87 - 5.11 MIL/uL   Hemoglobin 9.3 (L) 12.0 - 15.0 g/dL   HCT 29.7 (L) 36.0 - 46.0 %   MCV 96.1 80.0 - 100.0 fL   MCH 30.1 26.0 - 34.0 pg   MCHC 31.3 30.0 - 36.0 g/dL   RDW 15.4 11.5 - 15.5 %   Platelets 357 150 - 400 K/uL   nRBC 0.0 0.0 - 0.2 %   Neutrophils Relative % 65 %   Neutro Abs 3.5  1.7 - 7.7 K/uL   Lymphocytes Relative 15 %   Lymphs Abs 0.8 0.7 - 4.0 K/uL   Monocytes Relative 10 %   Monocytes Absolute 0.5 0.1 - 1.0 K/uL   Eosinophils Relative 9 %   Eosinophils Absolute 0.5 0.0 - 0.5 K/uL   Basophils Relative 1 %   Basophils Absolute 0.0 0.0 - 0.1 K/uL   Immature Granulocytes 0 %   Abs Immature Granulocytes 0.02 0.00 - 0.07 K/uL  Magnesium QWeek     Status: None   Collection Time: 10/30/19 11:51 PM  Result Value Ref Range   Magnesium 2.1 1.7 - 2.4 mg/dL  Phosphorus QWeek     Status: None   Collection Time: 10/30/19 11:51 PM  Result Value Ref Range   Phosphorus 3.3 2.5 - 4.6 mg/dL  Glucose, capillary     Status: Abnormal   Collection Time: 10/31/19 12:02 AM  Result Value Ref Range   Glucose-Capillary 116 (H) 70 - 99 mg/dL  Glucose, capillary     Status: Abnormal   Collection Time: 10/31/19  4:30 AM  Result Value Ref Range   Glucose-Capillary  117 (H) 70 - 99 mg/dL  Glucose, capillary     Status: Abnormal   Collection Time: 10/31/19  7:37 AM  Result Value Ref Range   Glucose-Capillary 111 (H) 70 - 99 mg/dL    Assessment & Plan: Present on Admission:  . Adenocarcinoma of head of pancreas ([place]) H/o sepsis Protein calorie malnutrition BRCA 2 H/o breast cancer Gastric outlet obstruction   LOS: 34 days   Additional comments:I reviewed the patient's new clinical lab test results.    81F s/p Whipple  Pancreatic cancer - s/p Whipple by Dr. Barry Dienes 11/3.  ypT2N1 ABL anemia - stable Presumed delayed gastric emptying vs obstruction due to swelling at [place]- plan open g tube tomorrow.    FEN -tube feeds got switched over the weekend.    Endo - hypoglycemia resolved.     DVT - LMWH, PT/OT Severe protein calorie malnutrition.  Prealbumin improved to 16.  Albumin 1.6 from 1.4.    Milus Height, MD FACS Surgical Oncology, General Surgery, Trauma and [place] Surgery, [place] for weekday/non holidays Check [email or web address] for  coverage night/weekend/holidays  Do not use SecureChat as we are not always in the epic system to get the message.  Also, we may be off.  It is not reliable for patient care.

## 2019-10-31 NOTE — Plan of Care (Signed)
  Problem: Education: Goal: Knowledge of General Education information will improve Description: Including pain rating scale, medication(s)/side effects and non-pharmacologic comfort measures Outcome: Progressing   Problem: Health Behavior/Discharge Planning: Goal: Ability to manage health-related needs will improve Outcome: Progressing   Problem: Clinical Measurements: Goal: Ability to maintain clinical measurements within normal limits will improve Outcome: Progressing Goal: Will remain free from infection Outcome: Progressing Goal: Diagnostic test results will improve Outcome: Progressing Goal: Respiratory complications will improve Outcome: Progressing Goal: Cardiovascular complication will be avoided Outcome: Progressing   Problem: Activity: Goal: Risk for activity intolerance will decrease Outcome: Progressing   Problem: Nutrition: Goal: Adequate nutrition will be maintained Outcome: Progressing   Problem: Coping: Goal: Level of anxiety will decrease Outcome: Progressing   Problem: Elimination: Goal: Will not experience complications related to bowel motility Outcome: Progressing Goal: Will not experience complications related to urinary retention Outcome: Progressing   Problem: Pain Managment: Goal: General experience of comfort will improve Outcome: Progressing   Problem: Safety: Goal: Ability to remain free from injury will improve Outcome: Progressing   Problem: Skin Integrity: Goal: Risk for impaired skin integrity will decrease Outcome: Progressing   Problem: Education: Goal: Will demonstrate proper wound care and an understanding of methods to prevent future damage Outcome: Progressing   Problem: Bowel/Gastric: Goal: Gastrointestinal status for postoperative course will improve Outcome: Progressing   Problem: Nutritional: Goal: Will attain and maintain optimal nutritional status will improve Outcome: Progressing   Problem: Clinical  Measurements: Goal: Postoperative complications will be avoided or minimized Outcome: Progressing   Problem: Respiratory: Goal: Will regain and/or maintain adequate ventilation Outcome: Progressing   Problem: Skin Integrity: Goal: Wound healing without signs and symptoms of infection will improve Outcome: Progressing Goal: Risk for impaired skin integrity will decrease Outcome: Progressing   Problem: Tissue Perfusion: Goal: Risk of venous thrombosis will decrease Outcome: Progressing   Problem: Urinary Elimination: Goal: Ability to achieve and maintain adequate urine output will improve Outcome: Progressing

## 2019-10-31 NOTE — TOC Progression Note (Signed)
Transition of Care [facility]) - Progression Note    Patient Details  Name: Julie Rollins MRN: [id number] Date of Birth: 03-21-1946  Transition of Care [facility]) CM/SW [place], [place] Phone Number: 10/31/2019, 11:57 AM  Clinical Narrative:    TOC team continues to follow, pt remains with ngtube, not stable for dc at this time.    Expected Discharge Plan: Skilled Nursing Facility Barriers to Discharge: Ship broker, Continued Medical Work up  Expected Discharge Plan and Services Expected Discharge Plan: [place] In-house Referral: Clinical Social Work Discharge Planning Services: CM Consult Post Acute Care Choice: [place] arrangements for the past 2 months: Single Family Home  Readmission Risk Interventions Readmission Risk Prevention Plan 09/30/2019 09/13/2019  Transportation Screening Complete Complete  Medication Review Press photographer) Referral to Pharmacy Referral to Pharmacy  PCP or Specialist appointment within 3-5 days of discharge Complete Not Complete  PCP/Specialist Appt Not Complete comments - pt established at [facility] and with PCP but DC date unknown  HRI or Home Care Consult Complete Complete  SW Recovery Care/Counseling Consult Complete Complete  Palliative Care Screening Not Complete Not Complete  Comments - pt in procedure did not discuss if any past palliative care involvement, however none in current encounter  [place] Complete Not Applicable  Some recent data might be hidden

## 2019-10-31 NOTE — Progress Notes (Signed)
Nutrition Follow-up  DOCUMENTATION CODES:   Non-severe (moderate) malnutrition in context of chronic illness  INTERVENTION:   Continue Osmolite 1.5 @ 50 ml/hr via j-tube  30 ml Prostat BID.    Tube feeding regimen provides 2000 kcal (100% of needs), 105 grams of protein, and 914 ml of H2O.    NUTRITION DIAGNOSIS:   Moderate Malnutrition related to chronic illness(pancreatic cancer s/p whipple) as evidenced by mild fat depletion, moderate fat depletion, mild muscle depletion, moderate muscle depletion.  Ongoing  GOAL:   Patient will meet greater than or equal to 90% of their needs  Progressing  REASON FOR ASSESSMENT:   Consult Enteral/tube feeding initiation and management  ASSESSMENT:   Pt with PMH of breast cancer s/p lumpectomy and XRT, hypothyroidism, HTN, dx with pancreatic cancer May 2020 s/p ERCP with stent, chemo (complicated by nausea, weight loss, duodenal obstruction requiring TPN) now admitted for pancreaticoduodenectomy with cholecystectomy with removal of 4 cm from pancreatic head.  Procedure(11/3): WHIPPLE PROCEDURE (Abdomen) GASTROJEJUNOSTOMY, J TUBE (Abdomen) for adenocarcinoma of the pancreas, s/p neoadjuvant chemo and neoadjuvant SBRT 11/17- NGT inserted 11/19- TF restarted 11/27- s/p upper GI endoscopy- revealed esophagitis (biopsied) and no window for PEG placement 11/28-switched to Vital formula due to diarrhea 12/5- TF formula switched back to Osmolite 1.5 per general surgery  Reviewed I/O's: -1.5 L x 24 hours and +3.7 L since 10/17/19  UOP: 550 ml x 24 hours  NGT output: 900 ml x 24 hours  Spoke with pt at bedside, who complains of some nausea today. She reports she has already had two bowel movements today- both with some loose stool and solid stool. She reports she has not seen much difference in diarrhea since transitioning from Vital AF formula back to Osmolite 1.5 formula.  Plan for open g-tube tomorrow; pt very eager for this and  in good spirits to remove NGT.   Osmolite 1.5 infusing via j-tube at 50 ml/hr, which provides 1800 kcals, 75 grams protein, and 914 ml free water, meeting 95% of estimated kcal needs 71% of estimated protein needs.   Labs reviewed: CBGS: 111-117 (inpatient orders for glycemic control are 2-6 units insulin aspart every 4 hours and 5 units insulin detemir every 12 hours).   Diet Order:   Diet Order            Diet NPO time specified Except for: Ice Chips  Diet effective now              EDUCATION NEEDS:   Education needs have been addressed  Skin:  Skin Assessment: Skin Integrity Issues: Skin Integrity Issues:: Incisions Incisions: abdomen  Last BM:  10/30/19  Height:   Ht Readings from Last 1 Encounters:  10/24/2019 5\' 4"  (1.626 m)    Weight:   Wt Readings from Last 1 Encounters:  10/31/19 73.5 kg    Ideal Body Weight:  54.5 kg  BMI:  Body mass index is 27.81 kg/m.  Estimated Nutritional Needs:   Kcal:  1900-2100  Protein:  105-120 grams  Fluid:  > 1.9 L    Julie Rollins, RD, LDN, [place] Registered Dietitian II Certified Diabetes Care and Education Specialist Pager: [phone] After hours Pager: [phone]

## 2019-11-01 ENCOUNTER — Inpatient Hospital Stay (HOSPITAL_COMMUNITY): Payer: Medicare HMO | Admitting: Certified Registered"

## 2019-11-01 ENCOUNTER — Encounter (HOSPITAL_COMMUNITY): Admission: RE | Disposition: E | Payer: Self-pay | Source: Home / Self Care | Attending: General Surgery

## 2019-11-01 ENCOUNTER — Inpatient Hospital Stay (HOSPITAL_COMMUNITY): Payer: Medicare HMO

## 2019-11-01 ENCOUNTER — Encounter (HOSPITAL_COMMUNITY): Payer: Self-pay | Admitting: General Practice

## 2019-11-01 HISTORY — PX: JEJUNOSTOMY: SHX313

## 2019-11-01 HISTORY — PX: GASTROSTOMY: SHX5249

## 2019-11-01 HISTORY — PX: APPLICATION OF WOUND VAC: SHX5189

## 2019-11-01 LAB — GLUCOSE, CAPILLARY
Glucose-Capillary: 120 mg/dL — ABNORMAL HIGH (ref 70–99)
Glucose-Capillary: 122 mg/dL — ABNORMAL HIGH (ref 70–99)
Glucose-Capillary: 140 mg/dL — ABNORMAL HIGH (ref 70–99)
Glucose-Capillary: 147 mg/dL — ABNORMAL HIGH (ref 70–99)
Glucose-Capillary: 169 mg/dL — ABNORMAL HIGH (ref 70–99)
Glucose-Capillary: 59 mg/dL — ABNORMAL LOW (ref 70–99)
Glucose-Capillary: 66 mg/dL — ABNORMAL LOW (ref 70–99)
Glucose-Capillary: 83 mg/dL (ref 70–99)
Glucose-Capillary: 89 mg/dL (ref 70–99)

## 2019-11-01 SURGERY — INSERTION OF GASTROSTOMY TUBE
Anesthesia: General | Site: Abdomen

## 2019-11-01 MED ORDER — CEFAZOLIN SODIUM-DEXTROSE 2-3 GM-%(50ML) IV SOLR
INTRAVENOUS | Status: DC | PRN
  Administered 2019-11-01: 2 g via INTRAVENOUS

## 2019-11-01 MED ORDER — PROPOFOL 10 MG/ML IV BOLUS
INTRAVENOUS | Status: AC
  Filled 2019-11-01: qty 20

## 2019-11-01 MED ORDER — PHENYLEPHRINE HCL-NACL 10-0.9 MG/250ML-% IV SOLN
INTRAVENOUS | Status: DC | PRN
  Administered 2019-11-01: 50 ug/min via INTRAVENOUS

## 2019-11-01 MED ORDER — LACTATED RINGERS IV SOLN
INTRAVENOUS | Status: DC | PRN
  Administered 2019-11-01 (×2): via INTRAVENOUS

## 2019-11-01 MED ORDER — LIDOCAINE 2% (20 MG/ML) 5 ML SYRINGE
INTRAMUSCULAR | Status: AC
  Filled 2019-11-01: qty 5

## 2019-11-01 MED ORDER — DEXAMETHASONE SODIUM PHOSPHATE 10 MG/ML IJ SOLN
INTRAMUSCULAR | Status: DC | PRN
  Administered 2019-11-01: 5 mg via INTRAVENOUS

## 2019-11-01 MED ORDER — SODIUM CHLORIDE 0.9 % IV SOLN
INTRAVENOUS | Status: DC | PRN
  Administered 2019-11-01: 10 mL

## 2019-11-01 MED ORDER — PHENYLEPHRINE HCL (PRESSORS) 10 MG/ML IV SOLN
INTRAVENOUS | Status: AC
  Filled 2019-11-01: qty 1

## 2019-11-01 MED ORDER — ENOXAPARIN SODIUM 40 MG/0.4ML ~~LOC~~ SOLN
40.0000 mg | SUBCUTANEOUS | Status: DC
  Administered 2019-11-02 – 2019-11-09 (×8): 40 mg via SUBCUTANEOUS
  Filled 2019-11-01 (×8): qty 0.4

## 2019-11-01 MED ORDER — FENTANYL CITRATE (PF) 100 MCG/2ML IJ SOLN
INTRAMUSCULAR | Status: AC
  Filled 2019-11-01: qty 2

## 2019-11-01 MED ORDER — MIDAZOLAM HCL 2 MG/2ML IJ SOLN
INTRAMUSCULAR | Status: AC
  Filled 2019-11-01: qty 2

## 2019-11-01 MED ORDER — FENTANYL CITRATE (PF) 250 MCG/5ML IJ SOLN
INTRAMUSCULAR | Status: AC
  Filled 2019-11-01: qty 5

## 2019-11-01 MED ORDER — FENTANYL CITRATE (PF) 100 MCG/2ML IJ SOLN
INTRAMUSCULAR | Status: DC | PRN
  Administered 2019-11-01 (×2): 50 ug via INTRAVENOUS
  Administered 2019-11-01: 100 ug via INTRAVENOUS
  Administered 2019-11-01: 50 ug via INTRAVENOUS

## 2019-11-01 MED ORDER — CEFAZOLIN SODIUM-DEXTROSE 2-4 GM/100ML-% IV SOLN
INTRAVENOUS | Status: AC
  Filled 2019-11-01: qty 100

## 2019-11-01 MED ORDER — LIDOCAINE 2% (20 MG/ML) 5 ML SYRINGE
INTRAMUSCULAR | Status: DC | PRN
  Administered 2019-11-01: 80 mg via INTRAVENOUS

## 2019-11-01 MED ORDER — PROPOFOL 10 MG/ML IV BOLUS
INTRAVENOUS | Status: DC | PRN
  Administered 2019-11-01: 90 mg via INTRAVENOUS

## 2019-11-01 MED ORDER — ONDANSETRON HCL 4 MG/2ML IJ SOLN: 4.0000 mg | Freq: Once | INTRAMUSCULAR | Status: DC | PRN

## 2019-11-01 MED ORDER — ROCURONIUM BROMIDE 10 MG/ML (PF) SYRINGE
PREFILLED_SYRINGE | INTRAVENOUS | Status: AC
  Filled 2019-11-01: qty 10

## 2019-11-01 MED ORDER — PHENYLEPHRINE HCL (PRESSORS) 10 MG/ML IV SOLN
INTRAVENOUS | Status: DC | PRN
  Administered 2019-11-01 (×3): 120 ug via INTRAVENOUS

## 2019-11-01 MED ORDER — ALBUMIN HUMAN 5 % IV SOLN
INTRAVENOUS | Status: DC | PRN
  Administered 2019-11-01: 16:00:00 via INTRAVENOUS

## 2019-11-01 MED ORDER — FENTANYL CITRATE (PF) 100 MCG/2ML IJ SOLN
25.0000 ug | INTRAMUSCULAR | Status: DC | PRN
  Administered 2019-11-01 (×2): 25 ug via INTRAVENOUS

## 2019-11-01 MED ORDER — DEXTROSE 50 % IV SOLN
INTRAVENOUS | Status: AC
  Administered 2019-11-01: 03:00:00 50 mL
  Filled 2019-11-01: qty 50

## 2019-11-01 MED ORDER — MIDAZOLAM HCL 5 MG/5ML IJ SOLN
INTRAMUSCULAR | Status: DC | PRN
  Administered 2019-11-01: 1 mg via INTRAVENOUS

## 2019-11-01 MED ORDER — STERILE WATER FOR IRRIGATION IR SOLN
Status: DC | PRN
  Administered 2019-11-01: 1000 mL

## 2019-11-01 MED ORDER — SUGAMMADEX SODIUM 200 MG/2ML IV SOLN
INTRAVENOUS | Status: DC | PRN
  Administered 2019-11-01: 150 mg via INTRAVENOUS

## 2019-11-01 MED ORDER — 0.9 % SODIUM CHLORIDE (POUR BTL) OPTIME
TOPICAL | Status: DC | PRN
  Administered 2019-11-01: 2000 mL

## 2019-11-01 MED ORDER — ROCURONIUM BROMIDE 10 MG/ML (PF) SYRINGE
PREFILLED_SYRINGE | INTRAVENOUS | Status: DC | PRN
  Administered 2019-11-01: 40 mg via INTRAVENOUS
  Administered 2019-11-01 (×2): 20 mg via INTRAVENOUS

## 2019-11-01 SURGICAL SUPPLY — 47 items
APL PRP STRL LF DISP 70% ISPRP (MISCELLANEOUS) ×1
BLADE CLIPPER SURG (BLADE) IMPLANT
CANISTER SUCT 3000ML PPV (MISCELLANEOUS) ×6 IMPLANT
CHLORAPREP W/TINT 26 (MISCELLANEOUS) ×3 IMPLANT
CLIP VESOCCLUDE LG 6/CT (CLIP) IMPLANT
CLIP VESOCCLUDE MED 24/CT (CLIP) IMPLANT
COVER WAND RF STERILE (DRAPES) ×1 IMPLANT
DRAIN PENROSE 18X1/2 LTX STRL (DRAIN) IMPLANT
DRAPE LAPAROSCOPIC ABDOMINAL (DRAPES) ×3 IMPLANT
DRAPE WARM FLUID 44X44 (DRAPES) ×3 IMPLANT
ELECT BLADE 6.5 EXT (BLADE) IMPLANT
ELECT REM PT RETURN 9FT ADLT (ELECTROSURGICAL) ×3
ELECTRODE REM PT RTRN 9FT ADLT (ELECTROSURGICAL) ×1 IMPLANT
GAUZE SPONGE 4X4 12PLY STRL (GAUZE/BANDAGES/DRESSINGS) ×3 IMPLANT
GLOVE BIO SURGEON STRL SZ 6 (GLOVE) ×3 IMPLANT
GLOVE INDICATOR 6.5 STRL GRN (GLOVE) ×3 IMPLANT
GOWN STRL REUS W/ TWL LRG LVL3 (GOWN DISPOSABLE) ×2 IMPLANT
GOWN STRL REUS W/TWL 2XL LVL3 (GOWN DISPOSABLE) ×4 IMPLANT
GOWN STRL REUS W/TWL LRG LVL3 (GOWN DISPOSABLE) ×9
KIT BASIN OR (CUSTOM PROCEDURE TRAY) ×3 IMPLANT
KIT TUBE JEJUNAL 16FR (CATHETERS) ×2 IMPLANT
KIT TURNOVER KIT B (KITS) ×3 IMPLANT
NS IRRIG 1000ML POUR BTL (IV SOLUTION) ×3 IMPLANT
PACK GENERAL/GYN (CUSTOM PROCEDURE TRAY) ×3 IMPLANT
PAD ARMBOARD 7.5X6 YLW CONV (MISCELLANEOUS) ×6 IMPLANT
PENCIL SMOKE EVACUATOR (MISCELLANEOUS) ×3 IMPLANT
SHEARS FOC LG CVD HARMONIC 17C (MISCELLANEOUS) IMPLANT
SPECIMEN JAR X LARGE (MISCELLANEOUS) ×3 IMPLANT
SPONGE LAP 18X18 RF (DISPOSABLE) ×4 IMPLANT
STAPLER VISISTAT 35W (STAPLE) ×3 IMPLANT
SUT ETHILON 2 0 FS 18 (SUTURE) ×4 IMPLANT
SUT PDS AB 1 TP1 96 (SUTURE) ×6 IMPLANT
SUT SILK 2 0 SH CR/8 (SUTURE) ×3 IMPLANT
SUT SILK 2 0 TIES 10X30 (SUTURE) ×3 IMPLANT
SUT SILK 2 0SH CR/8 30 (SUTURE) ×4 IMPLANT
SUT SILK 3 0 SH CR/8 (SUTURE) ×2 IMPLANT
SUT SILK 3 0 TIES 10X30 (SUTURE) ×2 IMPLANT
SUT VIC AB 3-0 SH 27 (SUTURE) ×3
SUT VIC AB 3-0 SH 27X BRD (SUTURE) IMPLANT
SYR 50ML LL SCALE MARK (SYRINGE) ×2 IMPLANT
TAPE CLOTH SURG 4X10 WHT LF (GAUZE/BANDAGES/DRESSINGS) ×2 IMPLANT
TOWEL GREEN STERILE (TOWEL DISPOSABLE) ×3 IMPLANT
TOWEL GREEN STERILE FF (TOWEL DISPOSABLE) ×3 IMPLANT
TRAY FOLEY MTR SLVR 14FR STAT (SET/KITS/TRAYS/PACK) ×3 IMPLANT
TRAY IRRIG W/60CC SYR STRL (SET/KITS/TRAYS/PACK) ×2 IMPLANT
TUBE MOSS GAS 18FR (TUBING) ×2 IMPLANT
YANKAUER SUCT BULB TIP NO VENT (SUCTIONS) ×3 IMPLANT

## 2019-11-01 NOTE — Progress Notes (Signed)
     Subjective:     Pt very nauseated this AM.      Objective:  Vital signs for last 24 hours: Temp:  [97.9 F (36.6 C)-98.3 F (36.8 C)] 97.9 F (36.6 C) (12/08 0301) Pulse Rate:  [90-97] 90 (12/08 0301) Resp:  [16] 16 (12/08 0301) BP: (122-131)/(56-60) 122/56 (12/08 0301) SpO2:  [94 %-96 %] 94 % (12/08 0301)  Intake/Output from previous day: 12/07 0701 - 12/08 0700 In: -  Out: 750 [Urine:450; Emesis/NG output:300]  Intake/Output this shift: Total I/O In: -  Out: 200 [Urine:200]  Vent settings for last 24 hours:    Physical Exam:  Gen: awake, looks uncomfortable  Neuro: no acute issues.   HEENT: NGT in place, bilious output Pulm: unlabored breathing Abd: soft, NT, sl distended.  Anasarca improving. Extr: wwp   Results for orders placed or performed during the hospital encounter of 10/23/2019 (from the past 24 hour(s))  Glucose, capillary     Status: None   Collection Time: 10/31/19  5:00 PM  Result Value Ref Range   Glucose-Capillary 96 70 - 99 mg/dL  Glucose, capillary     Status: None   Collection Time: 10/31/19  8:25 PM  Result Value Ref Range   Glucose-Capillary 89 70 - 99 mg/dL  Glucose, capillary     Status: Abnormal   Collection Time: 10/31/19 11:43 PM  Result Value Ref Range   Glucose-Capillary 60 (L) 70 - 99 mg/dL  Glucose, capillary     Status: Abnormal   Collection Time: 11/07/2019  3:02 AM  Result Value Ref Range   Glucose-Capillary 59 (L) 70 - 99 mg/dL  Glucose, capillary     Status: Abnormal   Collection Time: 11/16/2019  3:41 AM  Result Value Ref Range   Glucose-Capillary 140 (H) 70 - 99 mg/dL  Glucose, capillary     Status: None   Collection Time: 11/03/2019  8:18 AM  Result Value Ref Range   Glucose-Capillary 83 70 - 99 mg/dL  Glucose, capillary     Status: Abnormal   Collection Time: 11/13/2019 11:15 AM  Result Value Ref Range   Glucose-Capillary 66 (L) 70 - 99 mg/dL  Glucose, capillary     Status: Abnormal   Collection Time: 11/05/2019  11:39 AM  Result Value Ref Range   Glucose-Capillary 120 (H) 70 - 99 mg/dL    Assessment & Plan: Present on Admission:  . Adenocarcinoma of head of pancreas ([place]) H/o sepsis Protein calorie malnutrition BRCA 2 H/o breast cancer Gastric outlet obstruction   LOS: 35 days   Additional comments:I reviewed the patient's new clinical lab test results.    33F s/p Whipple  Pancreatic cancer - s/p Whipple by Dr. Barry Dienes 11/3.  ypT2N1 ABL anemia - stable Presumed delayed gastric emptying vs obstruction due to swelling at [place]- plan open g tube today.   FEN -tube feeds on hold for case.   Endo - hypoglycemia resolved.     DVT - LMWH, PT/OT Severe protein calorie malnutrition.  Prealbumin improved to 16.  Albumin 1.6 from 1.4.    Milus Height, MD FACS Surgical Oncology, General Surgery, Trauma and [place] [facility], [place] for weekday/non holidays Check [email or web address] for coverage night/weekend/holidays  Do not use SecureChat as we are not always in the epic system to get the message.  Also, we may be off.  It is not reliable for patient care.

## 2019-11-01 NOTE — Progress Notes (Signed)
Hypoglycemic Event  CBG: 59  Treatment: D50 50 mL (25 gm)  Symptoms: None  Follow-up CBG: Time:0341 CBG Result:140  Possible Reasons for Event: Inadequate meal intake  Comments/MD notified:    Suzan Nailer

## 2019-11-01 NOTE — Progress Notes (Signed)
Per portable equipment no feeding tube equipment available at this time; Will provide this patient with equipment as it become available.

## 2019-11-01 NOTE — Anesthesia Procedure Notes (Signed)
Procedure Name: Intubation Date/Time: 11/18/2019 2:27 PM Performed by: Moshe Salisbury, CRNA Pre-anesthesia Checklist: Patient identified, Emergency Drugs available, Suction available and Patient being monitored Patient Re-evaluated:Patient Re-evaluated prior to induction Oxygen Delivery Method: Circle system utilized Preoxygenation: Pre-oxygenation with 100% oxygen Induction Type: IV induction Ventilation: Mask ventilation without difficulty Laryngoscope Size: Mac and 3 Grade View: Grade I Tube type: Oral Tube size: 7.5 mm Number of attempts: 1 Airway Equipment and Method: Stylet Placement Confirmation: ETT inserted through vocal cords under direct vision,  positive ETCO2 and breath sounds checked- equal and bilateral Secured at: 22 cm Tube secured with: Tape Dental Injury: Teeth and Oropharynx as per pre-operative assessment

## 2019-11-01 NOTE — Anesthesia Preprocedure Evaluation (Signed)
Anesthesia Evaluation  Patient identified by MRN, date of birth, ID band Patient awake    Reviewed: Allergy & Precautions, NPO status , Patient's Chart, lab work & pertinent test results  History of Anesthesia Complications Negative for: history of anesthetic complications  Airway Mallampati: II  TM Distance: >3 FB Neck ROM: Full    Dental  (+) Dental Advisory Given, Partial Lower   Pulmonary shortness of breath,    Pulmonary exam normal breath sounds clear to auscultation       Cardiovascular hypertension, Pt. on medications Normal cardiovascular exam Rhythm:Regular Rate:Normal     Neuro/Psych negative neurological ROS     GI/Hepatic Neg liver ROS, Pancreatic cancer  GASTIC OUTLET OBSTRUCTION   Endo/Other  Hypothyroidism   Renal/GU negative Renal ROS     Musculoskeletal negative musculoskeletal ROS (+)   Abdominal   Peds  Hematology  (+) Blood dyscrasia, anemia ,   Anesthesia Other Findings Day of surgery medications reviewed with the patient.  Reproductive/Obstetrics                             Anesthesia Physical Anesthesia Plan  ASA: III  Anesthesia Plan: General   Post-op Pain Management:    Induction: Intravenous  PONV Risk Score and Plan: 3 and Dexamethasone and Ondansetron  Airway Management Planned: Oral ETT  Additional Equipment:   Intra-op Plan:   Post-operative Plan: Extubation in OR  Informed Consent: I have reviewed the patients History and Physical, chart, labs and discussed the procedure including the risks, benefits and alternatives for the proposed anesthesia with the patient or authorized representative who has indicated his/her understanding and acceptance.     Dental advisory given  Plan Discussed with: CRNA  Anesthesia Plan Comments:         Anesthesia Quick Evaluation

## 2019-11-01 NOTE — Transfer of Care (Signed)
Immediate Anesthesia Transfer of Care Note  Patient: ABBRA BERTHELOT  Procedure(s) Performed: OPEN INSERTION OF GASTROSTOMY TUBE (N/A Abdomen) JEJUNOSTOMY TUBE (N/A Abdomen) APPLICATION OF WOUND VAC (N/A Abdomen)  Patient Location: PACU  Anesthesia Type:General  Level of Consciousness: drowsy and patient cooperative  Airway & Oxygen Therapy: Patient Spontanous Breathing and Patient connected to face mask oxygen  Post-op Assessment: Report given to RN, Post -op Vital signs reviewed and stable and Patient moving all extremities  Post vital signs: Reviewed and stable  Last Vitals:  Vitals Value Taken Time  BP 130/46 11/22/2019 1628  Temp    Pulse 108 10/31/2019 1632  Resp 17 11/17/2019 1632  SpO2 97 % 11/19/2019 1632  Vitals shown include unvalidated device data.  Last Pain:  Vitals:   11/15/2019 0804  TempSrc:   PainSc: 3       Patients Stated Pain Goal: 0 ([id number])  Complications: No apparent anesthesia complications

## 2019-11-01 NOTE — Op Note (Signed)
PRE-OPERATIVE DIAGNOSIS: gastric outlet obstruction, s/p whipple with swelling at gastrojejunostomy   POST-OPERATIVE DIAGNOSIS:  Same  PROCEDURE:  Procedure(s): Open gastrostomy tube, change of jejunostomy tube, tube studies with fluoroscopy.    SURGEON:  Surgeon(s): Stark Klein, MD  ASSISTANT: Pryor Curia, RNFA  ANESTHESIA:   general  DRAINS: Gastrostomy Tube and Jejunostomy Tube   LOCAL MEDICATIONS USED:  NONE  SPECIMEN:  No Specimen  DISPOSITION OF SPECIMEN:  N/A  COUNTS:  YES  DICTATION: .Dragon Dictation  PLAN OF CARE: readmit to floor  PATIENT DISPOSITION:  PACU - hemodynamically stable.  FINDINGS:  Numerous midline adhesions.  Ascites present.  Gastroenteric tube just in stomach, replacement jejunostomy tube in jejunum.    EBL: 200 mL  PROCEDURE: Patient was identified in the holding area and taken the operating room where she was placed supine on the operating table.  General endotracheal anesthesia was induced.  Her abdomen was prepped and draped in sterile fashion.  The jejunostomy tube was prepped into the field.  A timeout was performed according to the surgical safety checklist.  When all was correct, we continued.  A midline incision was made in her previous incision.  There was some clear drainage from her wound consistent with anasarca.  The fascia was opened sharply.  There were several varicose veins directly under her incision that were suture-ligated.  The superior aspect of the incision was opened first and adhesions were taken down until the stomach was exposed.  These were sequentially taken down around the incision.  Once the anterior stomach was fully exposed, an appropriate site for G-tube was identified in the left upper quadrant.  2 pursestring sutures were placed in the stomach.  A gastrotomy was made with the cautery.  The gastroenteric tube was placed into the stomach through the abdominal wall through the pursestring sutures.  Pursestring  sutures were secured.  The anterior portion of the tube was not able to be threaded through the gastrojejunostomy due to the amount of adhesions.  Given the varicosities, additional dissection was not performed.  The stomach was then pexed to the abdominal wall with interrupted 2-0 silk sutures.  The balloon was inflated and pulled up gently to secure the tube.  The flange was pulled down to the skin level.  Care was taken to make sure that this was not extremely tight.  The gastrostomy tube flange was secured to the abdominal wall with interrupted 2 oh nylons.  The existing jejunostomy tube was then addressed.  It was indeed clogged and was not able to be flushed.  The 1 cc of fluid was deflated from the jejunostomy tube and the jejunostomy tube was removed.  The new J-tube was placed through the same hole.  It passed easily.  The tubes were flushed with water.  Half-strength Omnipaque was then injected in the new jejunostomy tube in the lower lateral position on the left abdomen.  This was positioned in the jejunum.  This flange was pulled up and secured as well.  The gastric port of the gastroenteric tube was also injected with half-strength Omnipaque.  This was confirmed to be in the stomach.  The enteric portion of the gastroenteric tube was still in the stomach.  The abdomen was irrigated.  The abdomen was inspected for any bleeding.  The fascia was then closed using running #1 looped PDS suture.  The skin was irrigated and closed loosely with staples.  A Prevena wound VAC was placed.  The patient was allowed to emerge  from anesthesia and taken to PACU in stable condition.  Needle, sponge, and instrument counts were correct x2.

## 2019-11-01 NOTE — Progress Notes (Signed)
Spoke with patients emergency contact [facility] and provided updates

## 2019-11-01 NOTE — Progress Notes (Signed)
OT Cancellation Note  Patient Details Name: Julie Rollins MRN: [id number] DOB: 12-08-1945   Cancelled Treatment:     Attempted x2 this AM, patient reports she is going for a procedure at 1300 and does not want to do therapy including bed exercises despite encouragement. Will re-attempt as time permits.   [place] OT office: [place] 10/27/2019, 10:01 AM

## 2019-11-01 NOTE — Progress Notes (Signed)
Hypoglycemic Event  CBG: 60  Treatment: D50 25 mL (12.5 gm)  Symptoms: None  Follow-up CBG: Time: CBG Result:  Possible Reasons for Event: Inadequate meal intake  Comments/MD notified:    Suzan Nailer

## 2019-11-01 NOTE — Anesthesia Postprocedure Evaluation (Signed)
Anesthesia Post Note  Patient: Julie Rollins  Procedure(s) Performed: OPEN INSERTION OF GASTROSTOMY TUBE (N/A Abdomen) JEJUNOSTOMY TUBE (N/A Abdomen) APPLICATION OF WOUND VAC (N/A Abdomen)     Patient location during evaluation: PACU Anesthesia Type: General Level of consciousness: awake and alert Pain management: pain level controlled Vital Signs Assessment: post-procedure vital signs reviewed and stable Respiratory status: spontaneous breathing, nonlabored ventilation, respiratory function stable and patient connected to nasal cannula oxygen Cardiovascular status: blood pressure returned to baseline and stable Postop Assessment: no apparent nausea or vomiting Anesthetic complications: no    Last Vitals:  Vitals:   10/31/2019 1713 11/11/2019 2052  BP: (!) 102/47 120/63  Pulse: (!) 103 (!) 117  Resp: (!) 22 20  Temp:  (!) 36.4 C  SpO2: 92% 95%    Last Pain:  Vitals:   10/25/2019 2052  TempSrc: Oral  PainSc:                  [name]

## 2019-11-01 NOTE — Progress Notes (Signed)
PT Cancellation Note  Patient Details Name: Julie Rollins MRN: [id number] DOB: 08-Jun-1946   Cancelled Treatment:    Reason Eval/Treat Not Completed: Patient declined, no reason specified. Reports she just does not feel like doing anything. Will re-attempt later this pm if time allows, otherwise tomorrow.     Awab Abebe 11/19/2019, 10:16 AM

## 2019-11-02 ENCOUNTER — Encounter (HOSPITAL_COMMUNITY): Payer: Self-pay | Admitting: General Surgery

## 2019-11-02 LAB — GLUCOSE, CAPILLARY
Glucose-Capillary: 115 mg/dL — ABNORMAL HIGH (ref 70–99)
Glucose-Capillary: 120 mg/dL — ABNORMAL HIGH (ref 70–99)
Glucose-Capillary: 134 mg/dL — ABNORMAL HIGH (ref 70–99)
Glucose-Capillary: 146 mg/dL — ABNORMAL HIGH (ref 70–99)
Glucose-Capillary: 163 mg/dL — ABNORMAL HIGH (ref 70–99)
Glucose-Capillary: 189 mg/dL — ABNORMAL HIGH (ref 70–99)

## 2019-11-02 MED ORDER — CHLORHEXIDINE GLUCONATE CLOTH 2 % EX PADS
6.0000 | MEDICATED_PAD | Freq: Every day | CUTANEOUS | Status: DC
  Administered 2019-11-02 – 2019-11-09 (×8): 6 via TOPICAL

## 2019-11-02 NOTE — TOC Progression Note (Signed)
Transition of Care [facility]) - Progression Note    Patient Details  Name: [name] MRN: [id number] Date of Birth: Jun 11, 1946  Transition of Care [facility]) CM/SW [place], [place] Phone Number: 11/02/2019, 3:37 PM  Clinical Narrative:    CSW acknowledging consult for SNF.  Referral has been sent through hub.   Upon review pt with limited participation in therapy. Will meet with pt to discuss participation- this will be important for insurance approval given that pt has a managed Medicare plan.   Continue to follow, will provide pt with offers.    Expected Discharge Plan: Skilled Nursing Facility Barriers to Discharge: Ship broker, Continued Medical Work up  Expected Discharge Plan and Services Expected Discharge Plan: [place] In-house Referral: Clinical Social Work Discharge Planning Services: CM Consult Post Acute Care Choice: [place] arrangements for the past 2 months: Single Family Home  Readmission Risk Interventions Readmission Risk Prevention Plan 09/30/2019 09/13/2019  Transportation Screening Complete Complete  Medication Review Press photographer) Referral to Pharmacy Referral to Pharmacy  PCP or Specialist appointment within 3-5 days of discharge Complete Not Complete  PCP/Specialist Appt Not Complete comments - pt established at [facility] and with PCP but DC date unknown  HRI or Home Care Consult Complete Complete  SW Recovery Care/Counseling Consult Complete Complete  Palliative Care Screening Not Complete Not Complete  Comments - pt in procedure did not discuss if any past palliative care involvement, however none in current encounter  [place] Complete Not Applicable  Some recent data might be hidden

## 2019-11-02 NOTE — Progress Notes (Signed)
OT Cancellation Note  Patient Details Name: Julie Rollins MRN: [id number] DOB: 12-30-1945   Cancelled Treatment:    Reason Eval/Treat Not Completed: Pain limiting ability to participate(Pt stating "I don't want to do anything.")  Pt stating "please, I don't want to move, I just go settled and I don't want to move around." Pt education on the importance of mobility, but pt continued to refused.   Ebony Hail, OTR/L Acute Rehabilitation Services Pager: [phone] Office: [phone]   Joriel Streety C 11/02/2019, 1:42 PM

## 2019-11-02 NOTE — Progress Notes (Signed)
Physical Therapy Treatment Patient Details Name: Julie Rollins MRN: [id number] DOB: 11/03/1946 Today's Date: 11/02/2019    History of Present Illness 73 y.o. female with medical history significant of pancreatic cancer, breast cancer status post lumpectomy and radiation, hypertension, hypothyroidism admitted s/p Whipple procedure.    PT Comments    Pt admitted for above diagnosis. Soon upon PT arrival, she declined all movement. She was educated on the importance of movement, especially considering she has not gotten OOB with PT recently due to refusal, but pt continued to adamantly decline EOB or OOB. She was agreeable to bed-level exercise as described below, all of which she tolerated well. She was min assist with rolling bed mobility today for hand-held assist. Pt would benefit from continued skilled acute PT intervention in order to address deficits.    Follow Up Recommendations  SNF     Equipment Recommendations  None recommended by PT    Recommendations for Other Services       Precautions / Restrictions Precautions Precautions: Fall Precaution Comments: NG tube Restrictions Weight Bearing Restrictions: No    Mobility  Bed Mobility Overal bed mobility: Needs Assistance Bed Mobility: Rolling Rolling: Min assist         General bed mobility comments: pt rolled due to complaints of "something poking on my back" so SPT and PT could check. Min assist due to pt insisting she hold onto a hand instead of the bed rail  Transfers                 General transfer comment: declined  Ambulation/Gait             General Gait Details: declined   Stairs             Wheelchair Mobility    Modified Rankin (Stroke Patients Only)       Balance       Sitting balance - Comments: pt declined sitting up                                    Cognition Arousal/Alertness: Awake/alert Behavior During Therapy: Restless;Anxious Overall  Cognitive Status: Within Functional Limits for tasks assessed                                 General Comments: pt very particular and anxious about all movement; pt unwilling to attempt anything other than bed-level exercise due to "just having surgery"      Exercises General Exercises - Upper Extremity Shoulder Flexion: 10 reps;Supine;Both;AROM General Exercises - Lower Extremity Ankle Circles/Pumps: AROM;Both;10 reps;Supine Quad Sets: AROM;Both;10 reps;Supine Short Arc Quad: AROM;Both;10 reps;Supine Heel Slides: AROM;Both;10 reps;Supine    General Comments        Pertinent Vitals/Pain Pain Assessment: Faces Faces Pain Scale: Hurts little more Pain Location: abdomen Pain Descriptors / Indicators: Discomfort Pain Intervention(s): Limited activity within patient's tolerance;Monitored during session    Home Living                      Prior Function            PT Goals (current goals can now be found in the care plan section) Acute Rehab PT Goals Patient Stated Goal: go home PT Goal Formulation: With patient Time For Goal Achievement: 11/06/2019 Potential to Achieve Goals: Fair Progress towards PT goals: Not  progressing toward goals - comment(pt declines EOB and OOB movement)    Frequency    Min 2X/week      PT Plan Current plan remains appropriate    Co-evaluation              AM-PAC PT "6 Clicks" Mobility   Outcome Measure  Help needed turning from your back to your side while in a flat bed without using bedrails?: A Little Help needed moving from lying on your back to sitting on the side of a flat bed without using bedrails?: A Little Help needed moving to and from a bed to a chair (including a wheelchair)?: A Lot Help needed standing up from a chair using your arms (e.g., wheelchair or bedside chair)?: A Lot Help needed to walk in hospital room?: A Lot Help needed climbing 3-5 steps with a railing? : Total 6 Click Score:  13    End of Session   Activity Tolerance: Patient limited by pain;Patient limited by fatigue Patient left: in bed;with call bell/phone within reach Nurse Communication: Mobility status PT Visit Diagnosis: Difficulty in walking, not elsewhere classified (R26.2);Muscle weakness (generalized) (M62.81)     Time: [id number] PT Time Calculation (min) (ACUTE ONLY): 13 min  Charges:  $Therapeutic Exercise: 8-22 mins                     Christel Mormon, SPT   Labaron Digirolamo 11/02/2019, 10:45 AM

## 2019-11-02 NOTE — Progress Notes (Signed)
During hand off this nurse received report that patients feeding tube was awaiting kangaroo pump to start medication and currently there weren't any available. This nurse reached out to equipment and was told we have none in stock currently at 2330. This nurse checked dirty and clean equipment no pump available.

## 2019-11-02 NOTE — Progress Notes (Signed)
Nutrition Follow-up  RD working remotely.  DOCUMENTATION CODES:   Non-severe (moderate) malnutrition in context of chronic illness  INTERVENTION:   Continue Osmolite 1.5 @ 50 ml/hr via j-tube  30 ml Prostat BID.    Tube feeding regimen provides 2000 kcal (100% of needs), 105 grams of protein, and 914 ml of H2O.   NUTRITION DIAGNOSIS:   Moderate Malnutrition related to chronic illness(pancreatic cancer s/p whipple) as evidenced by mild fat depletion, moderate fat depletion, mild muscle depletion, moderate muscle depletion.  Ongoing  GOAL:   Patient will meet greater than or equal to 90% of their needs  Met with TF  MONITOR:   PO intake, Supplement acceptance, Diet advancement, Labs, Weight trends, Skin, I & O's  REASON FOR ASSESSMENT:   Consult Enteral/tube feeding initiation and management  ASSESSMENT:   Pt with PMH of breast cancer s/p lumpectomy and XRT, hypothyroidism, HTN, dx with pancreatic cancer May 2020 s/p ERCP with stent, chemo (complicated by nausea, weight loss, duodenal obstruction requiring TPN) now admitted for pancreaticoduodenectomy with cholecystectomy with removal of 4 cm from pancreatic head.  Procedure(11/3): WHIPPLE PROCEDURE (Abdomen) GASTROJEJUNOSTOMY, J TUBE (Abdomen) for adenocarcinoma of the pancreas, s/p neoadjuvant chemo and neoadjuvant SBRT 11/17- NGT inserted 11/19- TF restarted 11/27- s/p upper GI endoscopy- revealed esophagitis (biopsied) and no window for PEG placement 11/28-switched to Vital formula due to diarrhea 12/5- TF formula switched back to Osmolite 1.5 per general surgery 12/8- NGT removed, s/p Procedure(s): Open gastrostomy tube, change of jejunostomy tube, tube studies with fluoroscopy  Reviewed I/O's: +865 ml x 24 hours and +474 ml since 10/19/19  UOP: 675 ml x 24 hours  Drain output: 250 ml x 24 hours  Per general surgery notes, pt now with wound vac to abdomen s/p procedure.   TF resumed this AM.    Per CSW notes, plan to d/c to SNF once medically stable.   Labs reviewed: CBGS: 147-189 (inpatient orders for glycemic control are 2-6 units insulin aspart every 4 hours and 5 units insulin detemir every 12 hours).   Diet Order:   Diet Order            Diet NPO time specified Except for: Sips with Meds  Diet effective midnight              EDUCATION NEEDS:   Education needs have been addressed  Skin:  Skin Assessment: Skin Integrity Issues: Skin Integrity Issues:: Wound VAC Wound Vac: abdomen Incisions: abdomen  Last BM:  11/02/19  Height:   Ht Readings from Last 1 Encounters:  10/22/2019 '5\' 4"'$  (1.626 m)    Weight:   Wt Readings from Last 1 Encounters:  10/31/19 73.5 kg    Ideal Body Weight:  54.5 kg  BMI:  Body mass index is 27.81 kg/m.  Estimated Nutritional Needs:   Kcal:  1900-2100  Protein:  105-120 grams  Fluid:  > 1.9 L    Wilbur Labuda A. Jimmye Norman, RD, LDN, [place] Registered Dietitian II Certified Diabetes Care and Education Specialist Pager: [phone] After hours Pager: [phone]

## 2019-11-02 NOTE — Progress Notes (Addendum)
Subjective:     Feeling better without NGT.  Pain is controlled.  No nausea today.      Objective:  Vital signs for last 24 hours: Temp:  [97.5 F (36.4 C)-98.2 F (36.8 C)] 98.2 F (36.8 C) (12/09 0510) Pulse Rate:  [99-117] 99 (12/09 0510) Resp:  [16-32] 16 (12/09 0510) BP: (94-124)/(47-63) 107/60 (12/09 0510) SpO2:  [92 %-100 %] 96 % (12/09 0510)  Intake/Output from previous day: 12/08 0701 - 12/09 0700 In: 1400 [I.V.:1100; IV Piggyback:300] Out: 535 [Urine:335; Blood:200]  Intake/Output this shift: No intake/output data recorded.  Vent settings for last 24 hours:    Physical Exam:  Gen: awake, looks more comfortable Neuro: no acute issues.   HEENT: NGt out Pulm: unlabored breathing Abd: soft, approp tender at incision.  provena wound vac in place.  Tube feeds running.  G tube to gravity with bilious output.   Extr: wwp   Results for orders placed or performed during the hospital encounter of 09/26/2019 (from the past 24 hour(s))  Glucose, capillary     Status: Abnormal   Collection Time: 11/08/2019 11:15 AM  Result Value Ref Range   Glucose-Capillary 66 (L) 70 - 99 mg/dL  Glucose, capillary     Status: Abnormal   Collection Time: 11/02/2019 11:39 AM  Result Value Ref Range   Glucose-Capillary 120 (H) 70 - 99 mg/dL  Glucose, capillary     Status: None   Collection Time: 11/15/2019  1:34 PM  Result Value Ref Range   Glucose-Capillary 89 70 - 99 mg/dL  Glucose, capillary     Status: Abnormal   Collection Time: 11/08/2019  5:43 PM  Result Value Ref Range   Glucose-Capillary 122 (H) 70 - 99 mg/dL  Glucose, capillary     Status: Abnormal   Collection Time: 10/26/2019  8:54 PM  Result Value Ref Range   Glucose-Capillary 169 (H) 70 - 99 mg/dL  Glucose, capillary     Status: Abnormal   Collection Time: 10/25/2019 11:46 PM  Result Value Ref Range   Glucose-Capillary 147 (H) 70 - 99 mg/dL  Glucose, capillary     Status: Abnormal   Collection Time: 11/02/19  5:12 AM   Result Value Ref Range   Glucose-Capillary 146 (H) 70 - 99 mg/dL  Glucose, capillary     Status: Abnormal   Collection Time: 11/02/19  8:05 AM  Result Value Ref Range   Glucose-Capillary 189 (H) 70 - 99 mg/dL    Assessment & Plan: Present on Admission:  . Adenocarcinoma of head of pancreas ([place]) H/o sepsis Protein calorie malnutrition BRCA 2 H/o breast cancer Gastric outlet obstruction   LOS: 36 days   Additional comments:I reviewed the patient's new clinical lab test results.    6F s/p Whipple  Pancreatic cancer - s/p Whipple by Dr. Barry Dienes 11/3.  ypT2N1 ABL anemia - stable Presumed delayed gastric emptying vs obstruction due to swelling at [place]-  Open G tube 12/8- G tube to gravity for now and J tube for feeding.    FEN -resumed at goal Endo - hypoglycemia resolved.     DVT - LMWH, PT/OT Severe protein calorie malnutrition.  Prealbumin improved to 16.  Albumin 1.6 from 1.4.  Will follow weekly  Dispo- will look for SNF placement at this point now that G tube is in place.  Will need full feeds via J tube at this point.  Pt has provena (incisional wound vac) at this point.  Unless she discharges very  soon, would expect vac to be discontinued by time of discharge.    Milus Height, MD FACS Surgical Oncology, General Surgery, Trauma and [place] [facility], [place] for weekday/non holidays Check [email or web address] for coverage night/weekend/holidays  Do not use SecureChat as we are not always in the epic system to get the message.  Also, we may be off.  It is not reliable for patient care.

## 2019-11-03 ENCOUNTER — Other Ambulatory Visit: Payer: Medicare HMO

## 2019-11-03 ENCOUNTER — Ambulatory Visit: Payer: Medicare HMO | Admitting: Hematology

## 2019-11-03 LAB — CBC
HCT: 26.6 % — ABNORMAL LOW (ref 36.0–46.0)
Hemoglobin: 8.8 g/dL — ABNORMAL LOW (ref 12.0–15.0)
MCH: 31.7 pg (ref 26.0–34.0)
MCHC: 33.1 g/dL (ref 30.0–36.0)
MCV: 95.7 fL (ref 80.0–100.0)
Platelets: 418 10*3/uL — ABNORMAL HIGH (ref 150–400)
RBC: 2.78 MIL/uL — ABNORMAL LOW (ref 3.87–5.11)
RDW: 14.8 % (ref 11.5–15.5)
WBC: 15.2 10*3/uL — ABNORMAL HIGH (ref 4.0–10.5)
nRBC: 0 % (ref 0.0–0.2)

## 2019-11-03 LAB — COMPREHENSIVE METABOLIC PANEL
ALT: 14 U/L (ref 0–44)
AST: 16 U/L (ref 15–41)
Albumin: 1.7 g/dL — ABNORMAL LOW (ref 3.5–5.0)
Alkaline Phosphatase: 112 U/L (ref 38–126)
Anion gap: 8 (ref 5–15)
BUN: 13 mg/dL (ref 8–23)
CO2: 28 mmol/L (ref 22–32)
Calcium: 8.3 mg/dL — ABNORMAL LOW (ref 8.9–10.3)
Chloride: 102 mmol/L (ref 98–111)
Creatinine, Ser: 0.58 mg/dL (ref 0.44–1.00)
GFR calc Af Amer: >60 mL/min (ref >60)
GFR calc non Af Amer: >60 mL/min (ref >60)
Glucose, Bld: 125 mg/dL — ABNORMAL HIGH (ref 70–99)
Potassium: 4.2 mmol/L (ref 3.5–5.1)
Sodium: 138 mmol/L (ref 135–145)
Total Bilirubin: 0.5 mg/dL (ref 0.3–1.2)
Total Protein: 4.8 g/dL — ABNORMAL LOW (ref 6.5–8.1)

## 2019-11-03 LAB — GLUCOSE, CAPILLARY
Glucose-Capillary: 115 mg/dL — ABNORMAL HIGH (ref 70–99)
Glucose-Capillary: 91 mg/dL (ref 70–99)
Glucose-Capillary: 128 mg/dL — ABNORMAL HIGH (ref 70–99)
Glucose-Capillary: 131 mg/dL — ABNORMAL HIGH (ref 70–99)
Glucose-Capillary: 65 mg/dL — ABNORMAL LOW (ref 70–99)
Glucose-Capillary: 119 mg/dL — ABNORMAL HIGH (ref 70–99)

## 2019-11-03 LAB — PHOSPHORUS: Phosphorus: 2.5 mg/dL (ref 2.5–4.6)

## 2019-11-03 LAB — MAGNESIUM: Magnesium: 1.9 mg/dL (ref 1.7–2.4)

## 2019-11-03 MED ORDER — DEXTROSE 50 % IV SOLN
INTRAVENOUS | Status: AC
  Administered 2019-11-03: 25 mL
  Filled 2019-11-03: qty 50

## 2019-11-03 NOTE — Progress Notes (Signed)
Hypoglycemic Event  CBG: 65  Treatment: 77mL D50 given  Symptoms: asymptomatic  Follow-up CBG: Time: 1723 CBG Result: 131  Possible Reasons for Event: unknown  Comments/MD notified: contacted [place] office for Stark Klein, MD; Dr. Dema Severin returned call. No new orders at this time. Will continue to monitor.     Racheal Patches, RN

## 2019-11-03 NOTE — Progress Notes (Signed)
Subjective:     "I feel miserable."      Objective:  Vital signs for last 24 hours: Temp:  [98 F (36.7 C)-98.3 F (36.8 C)] 98 F (36.7 C) (12/10 0439) Pulse Rate:  [89-98] 89 (12/10 0439) Resp:  [17] 17 (12/10 0439) BP: (108-116)/(51-62) 108/60 (12/10 0439) SpO2:  [95 %-97 %] 96 % (12/10 0439)  Intake/Output from previous day: 12/09 0701 - 12/10 0700 In: 0  Out: 1325 [Urine:1075; Drains:250]  Intake/Output this shift: No intake/output data recorded.  Vent settings for last 24 hours:    Physical Exam:  Gen: awake, looks distressed.   Neuro: no acute issues.   HEENT: NGt out Pulm: unlabored breathing Abd: soft, approp tender at incision.  provena wound vac in place.  Tube feeds running.  G tube to gravity with bilious output.   Abd more distended today.   Extr: wwp   Results for orders placed or performed during the hospital encounter of 10/04/2019 (from the past 24 hour(s))  Glucose, capillary     Status: Abnormal   Collection Time: 11/02/19  4:14 PM  Result Value Ref Range   Glucose-Capillary 134 (H) 70 - 99 mg/dL  Glucose, capillary     Status: Abnormal   Collection Time: 11/02/19  8:23 PM  Result Value Ref Range   Glucose-Capillary 115 (H) 70 - 99 mg/dL  Glucose, capillary     Status: Abnormal   Collection Time: 11/02/19 11:38 PM  Result Value Ref Range   Glucose-Capillary 120 (H) 70 - 99 mg/dL  Glucose, capillary     Status: Abnormal   Collection Time: 11/03/19  4:13 AM  Result Value Ref Range   Glucose-Capillary 115 (H) 70 - 99 mg/dL  CBC     Status: Abnormal   Collection Time: 11/03/19  6:31 AM  Result Value Ref Range   WBC 15.2 (H) 4.0 - 10.5 K/uL   RBC 2.78 (L) 3.87 - 5.11 MIL/uL   Hemoglobin 8.8 (L) 12.0 - 15.0 g/dL   HCT 26.6 (L) 36.0 - 46.0 %   MCV 95.7 80.0 - 100.0 fL   MCH 31.7 26.0 - 34.0 pg   MCHC 33.1 30.0 - 36.0 g/dL   RDW 14.8 11.5 - 15.5 %   Platelets 418 (H) 150 - 400 K/uL   nRBC 0.0 0.0 - 0.2 %  Comprehensive metabolic  panel     Status: Abnormal   Collection Time: 11/03/19  6:31 AM  Result Value Ref Range   Sodium 138 135 - 145 mmol/L   Potassium 4.2 3.5 - 5.1 mmol/L   Chloride 102 98 - 111 mmol/L   CO2 28 22 - 32 mmol/L   Glucose, Bld 125 (H) 70 - 99 mg/dL   BUN 13 8 - 23 mg/dL   Creatinine, Ser 0.58 0.44 - 1.00 mg/dL   Calcium 8.3 (L) 8.9 - 10.3 mg/dL   Total Protein 4.8 (L) 6.5 - 8.1 g/dL   Albumin 1.7 (L) 3.5 - 5.0 g/dL   AST 16 15 - 41 U/L   ALT 14 0 - 44 U/L   Alkaline Phosphatase 112 38 - 126 U/L   Total Bilirubin 0.5 0.3 - 1.2 mg/dL   GFR calc non Af Amer >60 >60 mL/min   GFR calc Af Amer >60 >60 mL/min   Anion gap 8 5 - 15  Magnesium     Status: None   Collection Time: 11/03/19  6:31 AM  Result Value Ref Range   Magnesium  1.9 1.7 - 2.4 mg/dL  Phosphorus     Status: None   Collection Time: 11/03/19  6:31 AM  Result Value Ref Range   Phosphorus 2.5 2.5 - 4.6 mg/dL  Glucose, capillary     Status: None   Collection Time: 11/03/19  7:37 AM  Result Value Ref Range   Glucose-Capillary 91 70 - 99 mg/dL  Glucose, capillary     Status: Abnormal   Collection Time: 11/03/19 11:42 AM  Result Value Ref Range   Glucose-Capillary 128 (H) 70 - 99 mg/dL    Assessment & Plan: Present on Admission:  . Adenocarcinoma of head of pancreas ([place]) H/o sepsis Protein calorie malnutrition BRCA 2 H/o breast cancer Gastric outlet obstruction   LOS: 37 days   Additional comments:I reviewed the patient's new clinical lab test results.    56F s/p Whipple  Pancreatic cancer - s/p Whipple by Dr. Barry Dienes 11/3.  ypT2N1 ABL anemia - stable Presumed delayed gastric emptying vs obstruction due to swelling at [place]-  Open G tube 12/8- G tube to gravity for now and J tube for feeding.  Hold feeds for several hours today.    FEN -resumed at goal Endo - hypoglycemia resolved.     DVT - LMWH, PT/OT Severe protein calorie malnutrition.  Prealbumin improved to 16.  Albumin 1.6 from 1.4.  Will follow weekly   Dispo- will look for SNF placement at this point now that G tube is in place.  Will need full feeds via J tube at this point.  Pt has provena (incisional wound vac) at this point.  Unless she discharges very soon, would expect vac to be discontinued by time of discharge.    Milus Height, MD FACS Surgical Oncology, General Surgery, Trauma and [place] [facility], [place] for weekday/non holidays Check [email or web address] for coverage night/weekend/holidays  Do not use SecureChat as we are not always in the epic system to get the message.  Also, we may be off.  It is not reliable for patient care.

## 2019-11-03 NOTE — Progress Notes (Signed)
Julie Rollins   DOB:Mar 03, 1946   EV#:[id number]   WEX#:[id number]  Oncology follow up   Subjective: Pt is well-known to me. She underwent Whipple surgery by Dr. Barry Dienes on November 3, has had difficulty recovery since surgery. Recently had G-tube in for nutrition.  I stopped by to see her, she complains of fatigue, abdominal discomfort form multiple tubes (drain, G-tube and wound vac) she is agreeable to go to SNF for rehab.    Objective:  Vitals:   11/03/19 2134 11/03/19 2205  BP: (!) 116/51   Pulse: (!) 112 (!) 109  Resp: 15   Temp: 97.7 F (36.5 C)   SpO2: 96%     Body mass index is 27.81 kg/m.  Intake/Output Summary (Last 24 hours) at 11/03/2019 2313 Last data filed at 11/03/2019 1800 Gross per 24 hour  Intake -  Output 800 ml  Net -800 ml     Sclerae unicteric  Oropharynx clear  No peripheral adenopathy  Lungs clear -- no rales or rhonchi  Heart regular rate and rhythm  Abdomen soft, she has G tube and drain covered, (+) wound vac   CBG (last 3)  Recent Labs    11/03/19 1652 11/03/19 1723 11/03/19 1934  GLUCAP 65* 131* 119*     Labs:  Urine Studies No results for input(s): UHGB, CRYS in the last 72 hours.  Invalid input(s): UACOL, UAPR, USPG, UPH, UTP, UGL, UKET, UBIL, UNIT, UROB, ULEU, UEPI, UWBC, URBC, UBAC, CAST, [place], [place]  Basic Metabolic Panel: Recent Labs  Lab 10/29/19 0333 10/30/19 0348 10/30/19 2351 11/03/19 0631  NA 138 138  --  138  K 3.5 3.8  --  4.2  CL 101 103  --  102  CO2 28 29  --  28  GLUCOSE 113* 119*  --  125*  BUN 8 9  --  13  CREATININE 0.48 0.47  --  0.58  CALCIUM 8.2* 7.7*  --  8.3*  MG 1.6*  --  2.1 1.9  PHOS  --   --  3.3 2.5   GFR Estimated Creatinine Clearance: 59.7 mL/min (by C-G formula based on SCr of 0.58 mg/dL). Liver Function Tests: Recent Labs  Lab 10/30/19 0348 11/03/19 0631  AST 24 16  ALT 27 14  ALKPHOS 192* 112  BILITOT 0.4 0.5  PROT 4.8* 4.8*  ALBUMIN 1.6* 1.7*   No results for input(s):  LIPASE, AMYLASE in the last 168 hours. No results for input(s): AMMONIA in the last 168 hours. Coagulation profile No results for input(s): INR, PROTIME in the last 168 hours.  CBC: Recent Labs  Lab 10/29/19 0333 10/30/19 2351 11/03/19 0631  WBC 5.3 5.3 15.2*  NEUTROABS  --  3.5  --   HGB 9.6* 9.3* 8.8*  HCT 29.9* 29.7* 26.6*  MCV 95.8 96.1 95.7  PLT 344 357 418*   Cardiac Enzymes: No results for input(s): CKTOTAL, CKMB, CKMBINDEX, TROPONINI in the last 168 hours. BNP: Invalid input(s): POCBNP CBG: Recent Labs  Lab 11/03/19 0737 11/03/19 1142 11/03/19 1652 11/03/19 1723 11/03/19 1934  GLUCAP 91 128* 65* 131* 119*   D-Dimer No results for input(s): DDIMER in the last 72 hours. Hgb A1c No results for input(s): HGBA1C in the last 72 hours. Lipid Profile No results for input(s): CHOL, HDL, LDLCALC, TRIG, CHOLHDL, LDLDIRECT in the last 72 hours. Thyroid function studies No results for input(s): TSH, T4TOTAL, T3FREE, THYROIDAB in the last 72 hours.  Invalid input(s): FREET3 Anemia work up No results for input(s):  VITAMINB12, FOLATE, FERRITIN, TIBC, IRON, RETICCTPCT in the last 72 hours. Microbiology No results found for this or any previous visit (from the past 240 hour(s)).    Studies:  No results found.  Assessment: 73 y.o. s/p Whipple surgery for pancreatic cancer by Dr. Barry Dienes on November 3  1.  Pancreatic cancer, status post neoadjuvant therapy (2 cycle chemo and Olaparib) and Whipple surgery, T3N1 2.  Severe protein and calorie malnutrition 3.  Deconditioning 4. Anemia  5. BRCA2 mutation  6.  Hypothyroidism   Plan:  -I reviewed her surgical pathology findings, her cancer was completely removed, with clear margins.  Unfortunately she had a significant residual tumor and positive lymph nodes. -Due to her very poor tolerance to neoadjuvant chemotherapy, and current severe malnutrition, poor performance status, I do not think she can tolerate more adjuvant  chemotherapy and I would not offer. -the role of adjuvant PARP inhibitor in resected pancreatic cancer is unclear, I do not plan to resume Olaparib -I encouraged her to continue rehab -We will see her back in a few months.   Truitt Merle, MD 11/03/2019

## 2019-11-03 NOTE — Progress Notes (Addendum)
Tube feeds held from 1220 - 1620 as ordered. Tube feeds restarted. Will continue to monitor.

## 2019-11-03 NOTE — Care Management Important Message (Signed)
Important Message  Patient Details  Name: Julie Rollins MRN: [id number] Date of Birth: 1945-12-26   Medicare Important Message Given:  Yes     Memory Argue 11/03/2019, 3:16 PM

## 2019-11-03 NOTE — Plan of Care (Signed)

## 2019-11-04 ENCOUNTER — Telehealth: Payer: Self-pay | Admitting: Hematology

## 2019-11-04 DIAGNOSIS — Z515 Encounter for palliative care: Secondary | ICD-10-CM

## 2019-11-04 LAB — CBC
HCT: 27.5 % — ABNORMAL LOW (ref 36.0–46.0)
Hemoglobin: 8.8 g/dL — ABNORMAL LOW (ref 12.0–15.0)
MCH: 31 pg (ref 26.0–34.0)
MCHC: 32 g/dL (ref 30.0–36.0)
MCV: 96.8 fL (ref 80.0–100.0)
Platelets: 442 10*3/uL — ABNORMAL HIGH (ref 150–400)
RBC: 2.84 MIL/uL — ABNORMAL LOW (ref 3.87–5.11)
RDW: 14.9 % (ref 11.5–15.5)
WBC: 11.2 10*3/uL — ABNORMAL HIGH (ref 4.0–10.5)
nRBC: 0 % (ref 0.0–0.2)

## 2019-11-04 LAB — GLUCOSE, CAPILLARY
Glucose-Capillary: 128 mg/dL — ABNORMAL HIGH (ref 70–99)
Glucose-Capillary: 131 mg/dL — ABNORMAL HIGH (ref 70–99)
Glucose-Capillary: 134 mg/dL — ABNORMAL HIGH (ref 70–99)
Glucose-Capillary: 134 mg/dL — ABNORMAL HIGH (ref 70–99)
Glucose-Capillary: 165 mg/dL — ABNORMAL HIGH (ref 70–99)
Glucose-Capillary: 91 mg/dL (ref 70–99)

## 2019-11-04 MED ORDER — LORAZEPAM 2 MG/ML IJ SOLN
0.5000 mg | Freq: Three times a day (TID) | INTRAMUSCULAR | Status: DC | PRN
  Administered 2019-11-04 – 2019-11-09 (×7): 0.5 mg via INTRAVENOUS
  Filled 2019-11-04 (×7): qty 1

## 2019-11-04 MED ORDER — ACETAMINOPHEN 325 MG PO TABS
650.0000 mg | ORAL_TABLET | Freq: Three times a day (TID) | ORAL | Status: DC
  Administered 2019-11-06 – 2019-11-09 (×7): 650 mg via ORAL
  Filled 2019-11-04 (×7): qty 2

## 2019-11-04 MED ORDER — SERTRALINE HCL 50 MG PO TABS
25.0000 mg | ORAL_TABLET | Freq: Every day | ORAL | Status: DC
  Filled 2019-11-04: qty 1

## 2019-11-04 MED ORDER — HYDROMORPHONE HCL 1 MG/ML IJ SOLN
0.5000 mg | INTRAMUSCULAR | Status: DC | PRN
  Administered 2019-11-04 – 2019-11-05 (×4): 1 mg via INTRAVENOUS
  Filled 2019-11-04 (×5): qty 1

## 2019-11-04 MED ORDER — TRAZODONE HCL 50 MG PO TABS
50.0000 mg | ORAL_TABLET | Freq: Every day | ORAL | Status: DC
  Filled 2019-11-04: qty 1

## 2019-11-04 NOTE — Progress Notes (Signed)
PT Cancellation Note  Patient Details Name: Julie Rollins MRN: [id number] DOB: Aug 11, 1946   Cancelled Treatment:    Reason Eval/Treat Not Completed: Patient declined, no reason specified Attempted for ~5 minutes to educate on importance of OOB activity, but pt continued to decline therapy stating "I just don't feel like it."  Tried to encourage sitting EOB or exercises, but refused that too.  Will f/u as able.   Mikael Spray [employer] K[place]io 11/04/2019, 1:45 PM

## 2019-11-04 NOTE — Telephone Encounter (Signed)
Scheduled appt per 12/10 sch message - unable to reach pt or leave message - mailed letter with appt date and time

## 2019-11-04 NOTE — Consult Note (Signed)
Consultation Note Date: 11/04/2019   Patient Name: Julie Rollins  DOB: 12-20-1945  MRN: [id number]  Age / Sex: 73 y.o., female  PCP: Nolene Ebbs, MD Referring Physician: Stark Klein, MD  Reason for Consultation: Establishing goals of care and Psychosocial/spiritual support  HPI/Patient Profile: 73 y.o. female  with past medical history of breast cancer, hypothyroidism, anxiety and pancreatic cancer who was admitted on 10/06/2019.  She is now s/p Whipple procedure 10/14/2019.  Per notes she had clean margins but has residual tumor and 2 positive lymph nodes.  She experienced delayed gastric emptying post procedure and was unable to take PO nutrition.  Gastrostomy and jejunostomy tube wasp laced.  The patient continues to have failure to thrive.  PMT was asked to consult for symptom management.  Clinical Assessment and Goals of Care:  I have reviewed medical records including EPIC notes, labs and imaging, received report from Dr. Barry Dienes, assessed the patient and then met at the bedside along with Ms. Bakke  to discuss diagnosis prognosis, GOC, EOL wishes, disposition and options.  She has a Living Will in which she has actually hand written "If I am dying I want to be comfortable as possible and free of pain.  No tubes and if I can't get well no feeding tubes.  When I die I will be cremated."  We discussed a brief life review of the patient. She worked in a Clinical cytogeneticist.  She was married once and divorced years ago.  Her ex-husband has since died.  She has little to no family - they are deceased.  She lived at home independently prior to admission.  [name] is her friend.  Brenlynn tells me [name] is her surrogate medical decision maker if one is ever needed.  [name] is a [employer] but is not active in church.  As far as functional and nutritional status - Laquasha is having eructation through out our visit and seems  generally uncomfortable.  She comments that the PT wanted her to get out of be but she just feels bad and can't do it.  She comments that she has been receiving a lot of medication - perhaps too much at times.  We discussed her current illness and what it means in the larger context of her on-going co-morbidities.  Natural disease trajectory and expectations at EOL were discussed.  I asked Harbour to tell me about her cancer.  She replied "It's gone".  I questioned "It's gone?" and she stated again "it's gone".  As I have not discussed this case yet with Dr. [name] I didn't correct Ruth.  We discussed symptom management.  Kinzlee's biggest complaint was a lack of sleep and continuous burping.  I told her I would make some minor medication adjustments to see if we could help her feel better.   Primary Decision Maker:  PATIENT     SUMMARY OF RECOMMENDATIONS    PMT will continue to follow with you.  Made small changes to symptom management outlined below  Will ask Chaplain  to see her for encouragement.  Patient stated her cancer is "Gone".  I'm concerned that she does not have an accurate understanding.  Per Dr. Ernestina Penna note she has residual tumor and positive lymph nodes.  Due to severe malnutrition and an inability to tolerate chemotherapy, chemotherapy is not being offered.  I did not discuss this with her on our 1st meeting as I want to gain a full understanding from Dr. [name] -  but I believe it is important for the patient to know in making decisions about [place].  Code Status/Advance Care Planning:  Full.  To be discussed   Symptom Management:   Scheduled 650 mg tylenol q 8 hours  Scheduled trazodone 50 mg QHS for sleep  Scheduled zoloft 25 mg QHS for depression / anxiety.  Potentially consider reducing dilaudid as patient is receiving 1 mg 3-4 times daily.  Additional Recommendations (Limitations, Scope, Preferences):  Full Scope Treatment  Psycho-social/Spiritual:   Desire for  further Chaplaincy support: requested  Prognosis:  Unable to determine.  Will discuss with Oncology.  I'm concerned given her severe malnutrition, inability to tolerate chemotherapy and residual pancreatic cancer that it is conceivable the patient has only weeks to months.   Discharge Planning: To Be Determined      Primary Diagnoses: Present on Admission: . (Resolved) Pancreatic cancer ([place]) . (Resolved) Adenocarcinoma of head of pancreas ([place]) . Hypothyroidism . Carcinoma of head of pancreas s/p Whiplle pancreaticoduodenectomy 10/02/2019 . BRCA2 gene mutation positive in female . Protein-calorie malnutrition, severe ([place])   I have reviewed the medical record, interviewed the patient and family, and examined the patient. The following aspects are pertinent.  Past Medical History:  Diagnosis Date  . Anxiety   . Blood transfusion without reported diagnosis    patient refuses bld products -albumin ok - refusal consent on patient's chart  . Breast CA ([place])    s/p lumpectomy and radiation 2000  . Colitis    2007  . Dyspnea   . Dysrhythmia   . Family history of bladder cancer   . Family history of stomach cancer   . Family history of throat cancer   . Hernia, epigastric   . HTN (hypertension)    "mild hypertension", was on BP med years ago but it caused orthostatic hypotension and she has not been medicated since  . Hypothyroid   . Pancreatic cancer ([place]) dx'd 03/2019  . Personal history of radiation therapy 2001  . Vertigo    Social History   Socioeconomic History  . Marital status: Divorced    Spouse name: Not on file  . Number of children: Not on file  . Years of education: Not on file  . Highest education level: Not on file  Occupational History  . Occupation: retired    Comment: [employer]   Tobacco Use  . Smoking status: Never Smoker  . Smokeless tobacco: Never Used  Substance and Sexual Activity  . Alcohol use: No  . Drug use: No  . Sexual activity: Not  Currently  Other Topics Concern  . Not on file  Social History Narrative   Lives alone   Social Determinants of Health   Financial Resource Strain:   . Difficulty of Paying Living Expenses: Not on file  Food Insecurity:   . Worried About Charity fundraiser in the Last Year: Not on file  . Ran Out of Food in the Last Year: Not on file  Transportation Needs: No Transportation Needs  . Lack of  Transportation (Medical): No  . Lack of Transportation (Non-Medical): No  Physical Activity:   . Days of Exercise per Week: Not on file  . Minutes of Exercise per Session: Not on file  Stress:   . Feeling of Stress : Not on file  Social Connections:   . Frequency of Communication with Friends and Family: Not on file  . Frequency of Social Gatherings with Friends and Family: Not on file  . Attends Religious Services: Not on file  . Active Member of Clubs or Organizations: Not on file  . Attends Archivist Meetings: Not on file  . Marital Status: Not on file   Family History  Problem Relation Age of Onset  . Bladder Cancer Brother   . Other Other        Denies family h/o cardiac disease  . Stomach cancer Maternal Uncle   . Throat cancer Maternal Uncle    Scheduled Meds: . acetaminophen  650 mg Oral Q8H  . chlorhexidine  15 mL Mouth Rinse BID  . Chlorhexidine Gluconate Cloth  6 each Topical Daily  . enoxaparin (LOVENOX) injection  40 mg Subcutaneous Q24H  . feeding supplement (PRO-STAT SUGAR FREE 64)  30 mL Per Tube BID  . insulin aspart  2-6 Units Subcutaneous Q4H  . insulin detemir  5 Units Subcutaneous Q12H  . levothyroxine  50 mcg Per Tube Q0600  . lidocaine  1 patch Transdermal Q24H  . lip balm  1 application Topical BID  . loperamide HCl  2 mg Oral QHS  . mouth rinse  15 mL Mouth Rinse q12n4p  . pantoprazole sodium  40 mg Per Tube BID  . sertraline  25 mg Oral QHS  . sodium chloride flush  10-40 mL Intracatheter Q12H  . traZODone  50 mg Oral QHS   Continuous  Infusions: . sodium chloride Stopped (10/03/19 0942)  . dextrose 5 % and 0.9% NaCl Stopped (10/29/19 1029)  . feeding supplement (OSMOLITE 1.5 CAL) 1,000 mL (11/04/19 1355)   PRN Meds:.sodium chloride, acetaminophen, alum & mag hydroxide-simeth, diphenhydrAMINE **OR** diphenhydrAMINE, Gerhardt's butt cream, guaiFENesin-dextromethorphan, hydrocortisone, hydrocortisone cream, HYDROmorphone (DILAUDID) injection, loperamide HCl, LORazepam, magic mouthwash, menthol-cetylpyridinium, metoprolol tartrate, ondansetron **OR** ondansetron (ZOFRAN) IV, oxyCODONE, phenol, promethazine, sodium chloride flush Allergies  Allergen Reactions  . Codeine Nausea Only   Review of Systems nausea, burping, fatigue, weakness  Physical Exam  Well developed woman, appears chronically ill.  Awake alert appropriate CV tachy resp no distress  Abdomen distended but not tight.  PEG/J tube in place -no erythema or drainage. Lower ext with 2+ edema bilaterally  Vital Signs: BP 127/63 (BP Location: Right Arm)   Pulse (!) 118   Temp 98 F (36.7 C) (Oral)   Resp 20   Ht '5\' 4"'$  (1.626 m)   Wt 73.5 kg   LMP  (LMP Unknown)   SpO2 97%   BMI 27.81 kg/m  Pain Scale: 0-10 POSS *See Group Information*: 1-Acceptable,Awake and alert Pain Score: Asleep   SpO2: SpO2: 97 % O2 Device:SpO2: 97 % O2 Flow Rate: .O2 Flow Rate (L/min): 2 L/min  IO: Intake/output summary:   Intake/Output Summary (Last 24 hours) at 11/04/2019 1659 Last data filed at 11/04/2019 1500 Gross per 24 hour  Intake 1000.83 ml  Output 601 ml  Net 399.83 ml    LBM: Last BM Date: 11/04/19 Baseline Weight: Weight: 59.8 kg Most recent weight: Weight: 73.5 kg     Palliative Assessment/Data: 20%     Time In: 4:00 Time Out:  5:10 Time Total: 70 min. Visit consisted of counseling and education dealing with the complex and emotionally intense issues surrounding the need for palliative care and symptom management in the setting of serious and  potentially life-threatening illness. Greater than 50%  of this time was spent counseling and coordinating care related to the above assessment and plan.  Signed by: Florentina Jenny, PA-C Palliative Medicine Pager: [phone]  Please contact Palliative Medicine Team phone at [phone] for questions and concerns.  For individual provider: See Shea Evans

## 2019-11-04 NOTE — Progress Notes (Signed)
Subjective:     Feels like the medicine "made me too relaxed.  I can't think."  Main complaint today is lots of gas.      Objective:  Vital signs for last 24 hours: Temp:  [97.3 F (36.3 C)-98.2 F (36.8 C)] 97.3 F (36.3 C) (12/11 0446) Pulse Rate:  [109-120] 111 (12/11 0446) Resp:  [15-18] 17 (12/11 0446) BP: (116-134)/(50-51) 134/51 (12/11 0446) SpO2:  [96 %-98 %] 97 % (12/11 0446)  Intake/Output from previous day: 12/10 0701 - 12/11 0700 In: 437.5 [NG/GT:437.5] Out: 600 [Urine:500; Drains:100]  Intake/Output this shift: No intake/output data recorded.  Vent settings for last 24 hours:    Physical Exam:  Gen: awake, looks sleepy, but less uncomfortable.   Neuro: no acute issues.   HEENT: NGT out Pulm: unlabored breathing Abd: soft, approp tender at incision.  provena wound vac in place.  Tube feeds running.  G tube to gravity with bilious output.   Abd mod distended  Extr: wwp   Results for orders placed or performed during the hospital encounter of 10/20/2019 (from the past 24 hour(s))  Glucose, capillary     Status: Abnormal   Collection Time: 11/03/19 11:42 AM  Result Value Ref Range   Glucose-Capillary 128 (H) 70 - 99 mg/dL  Glucose, capillary     Status: Abnormal   Collection Time: 11/03/19  4:52 PM  Result Value Ref Range   Glucose-Capillary 65 (L) 70 - 99 mg/dL   Comment 1 Notify RN    Comment 2 Document in Chart   Glucose, capillary     Status: Abnormal   Collection Time: 11/03/19  5:23 PM  Result Value Ref Range   Glucose-Capillary 131 (H) 70 - 99 mg/dL  Glucose, capillary     Status: Abnormal   Collection Time: 11/03/19  7:34 PM  Result Value Ref Range   Glucose-Capillary 119 (H) 70 - 99 mg/dL  Glucose, capillary     Status: Abnormal   Collection Time: 11/04/19 12:26 AM  Result Value Ref Range   Glucose-Capillary 128 (H) 70 - 99 mg/dL  Glucose, capillary     Status: None   Collection Time: 11/04/19  4:41 AM  Result Value Ref Range   Glucose-Capillary 91 70 - 99 mg/dL  CBC     Status: Abnormal   Collection Time: 11/04/19  7:24 AM  Result Value Ref Range   WBC 11.2 (H) 4.0 - 10.5 K/uL   RBC 2.84 (L) 3.87 - 5.11 MIL/uL   Hemoglobin 8.8 (L) 12.0 - 15.0 g/dL   HCT 27.5 (L) 36.0 - 46.0 %   MCV 96.8 80.0 - 100.0 fL   MCH 31.0 26.0 - 34.0 pg   MCHC 32.0 30.0 - 36.0 g/dL   RDW 14.9 11.5 - 15.5 %   Platelets 442 (H) 150 - 400 K/uL   nRBC 0.0 0.0 - 0.2 %  Glucose, capillary     Status: Abnormal   Collection Time: 11/04/19  8:28 AM  Result Value Ref Range   Glucose-Capillary 134 (H) 70 - 99 mg/dL    Assessment & Plan: Present on Admission:  . Adenocarcinoma of head of pancreas ([place]) H/o sepsis Protein calorie malnutrition BRCA 2 H/o breast cancer Gastric outlet obstruction   LOS: 38 days   Additional comments:I reviewed the patient's new clinical lab test results.    44F s/p Whipple  Pancreatic cancer - s/p Whipple by Dr. Barry Dienes 11/3.  ypT2N1 ABL anemia - stable Presumed delayed gastric  emptying vs obstruction due to swelling at [place]-  Open G tube 12/8- G tube to gravity for now and J tube for feeding.     FEN -resumed at goal Endo - hypoglycemia resolved.     DVT - LMWH, PT/OT Severe protein calorie malnutrition.  Prealbumin improved to 16.  Albumin 1.6 from 1.4.  Will follow weekly  Dispo- will look for SNF placement at this point now that G tube is in place.  Will need full feeds via J tube. Pt has provena (incisional wound vac) at this point.  Unless she discharges very soon, would expect vac to be discontinued by time of discharge.    Milus Height, MD FACS Surgical Oncology, General Surgery, Trauma and [place] [facility], [place] for weekday/non holidays Check [email or web address] for coverage night/weekend/holidays  Do not use SecureChat as we are not always in the epic system to get the message.  Also, we may be off.  It is not reliable for patient care.

## 2019-11-04 NOTE — TOC Progression Note (Signed)
Transition of Care [facility]) - Progression Note    Patient Details  Name: Julie Rollins MRN: [id number] Date of Birth: 17-Nov-1946  Transition of Care [facility]) CM/SW [place], [place] Phone Number: 11/04/2019, 10:33 AM  Clinical Narrative:    CSW met with pt at bedside. Introduced self, role, reason for visit. Pt states she feels very poorly. CSW provided encouragement and reassurance. Discussed pt continued interest in SNF placement. Provided pt with offers and CMS ratings. Pt requested CSW leave them on bedside table as she was not up to looking over them at this time. CSW/TOC team continues to follow.    Expected Discharge Plan: Skilled Nursing Facility Barriers to Discharge: Ship broker, Continued Medical Work up  Expected Discharge Plan and Services Expected Discharge Plan: [place] In-house Referral: Clinical Social Work Discharge Planning Services: CM Consult Post Acute Care Choice: [place] arrangements for the past 2 months: Single Family Home     Readmission Risk Interventions Readmission Risk Prevention Plan 09/30/2019 09/13/2019  Transportation Screening Complete Complete  Medication Review Press photographer) Referral to Pharmacy Referral to Pharmacy  PCP or Specialist appointment within 3-5 days of discharge Complete Not Complete  PCP/Specialist Appt Not Complete comments - pt established at [facility] and with PCP but DC date unknown  HRI or Home Care Consult Complete Complete  SW Recovery Care/Counseling Consult Complete Complete  Palliative Care Screening Not Complete Not Complete  Comments - pt in procedure did not discuss if any past palliative care involvement, however none in current encounter  [place] Complete Not Applicable  Some recent data might be hidden

## 2019-11-05 ENCOUNTER — Inpatient Hospital Stay (HOSPITAL_COMMUNITY): Payer: Medicare HMO

## 2019-11-05 ENCOUNTER — Inpatient Hospital Stay: Payer: Self-pay

## 2019-11-05 DIAGNOSIS — R531 Weakness: Secondary | ICD-10-CM

## 2019-11-05 DIAGNOSIS — K3189 Other diseases of stomach and duodenum: Secondary | ICD-10-CM

## 2019-11-05 DIAGNOSIS — E43 Unspecified severe protein-calorie malnutrition: Secondary | ICD-10-CM

## 2019-11-05 DIAGNOSIS — Z515 Encounter for palliative care: Secondary | ICD-10-CM

## 2019-11-05 LAB — GLUCOSE, CAPILLARY
Glucose-Capillary: 118 mg/dL — ABNORMAL HIGH (ref 70–99)
Glucose-Capillary: 128 mg/dL — ABNORMAL HIGH (ref 70–99)
Glucose-Capillary: 137 mg/dL — ABNORMAL HIGH (ref 70–99)
Glucose-Capillary: 141 mg/dL — ABNORMAL HIGH (ref 70–99)
Glucose-Capillary: 143 mg/dL — ABNORMAL HIGH (ref 70–99)
Glucose-Capillary: 151 mg/dL — ABNORMAL HIGH (ref 70–99)

## 2019-11-05 MED ORDER — HYDROMORPHONE HCL 1 MG/ML IJ SOLN
0.5000 mg | INTRAMUSCULAR | Status: DC | PRN
  Administered 2019-11-05 – 2019-11-09 (×18): 0.5 mg via INTRAVENOUS
  Filled 2019-11-05 (×19): qty 1

## 2019-11-05 MED ORDER — ONDANSETRON HCL 4 MG/2ML IJ SOLN
4.0000 mg | Freq: Three times a day (TID) | INTRAMUSCULAR | Status: DC
  Administered 2019-11-05 – 2019-11-09 (×12): 4 mg via INTRAVENOUS
  Filled 2019-11-05 (×12): qty 2

## 2019-11-05 MED ORDER — SODIUM CHLORIDE 0.9% FLUSH
10.0000 mL | Freq: Two times a day (BID) | INTRAVENOUS | Status: DC
  Administered 2019-11-07 – 2019-11-09 (×3): 10 mL

## 2019-11-05 MED ORDER — DEXAMETHASONE SODIUM PHOSPHATE 4 MG/ML IJ SOLN
4.0000 mg | Freq: Once | INTRAMUSCULAR | Status: AC
  Administered 2019-11-05: 4 mg via INTRAVENOUS
  Filled 2019-11-05: qty 1

## 2019-11-05 MED ORDER — SODIUM CHLORIDE 0.9% FLUSH: 10.0000 mL | INTRAVENOUS | Status: DC | PRN

## 2019-11-05 MED ORDER — OSMOLITE 1.5 CAL PO LIQD
1000.0000 mL | ORAL | Status: DC
  Administered 2019-11-05: 1000 mL
  Filled 2019-11-05: qty 1000

## 2019-11-05 MED ORDER — OLANZAPINE 5 MG PO TABS
2.5000 mg | ORAL_TABLET | Freq: Every day | ORAL | Status: DC
  Administered 2019-11-05: 2.5 mg via ORAL
  Filled 2019-11-05: qty 1

## 2019-11-05 NOTE — Progress Notes (Addendum)
Pt vomited again with green/yellow material. Will continue to hold tube feeding at this time. PRN zofran given ad ordered. Will endorse appropriately.

## 2019-11-05 NOTE — Progress Notes (Signed)
Pt projectile vomited with green/yellow material. Held tube feeding. PRN phenergan given. Will continue to monitor patient.

## 2019-11-05 NOTE — Progress Notes (Signed)
Peripherally Inserted Central Catheter/Midline Placement  The IV Nurse has discussed with the patient and/or persons authorized to consent for the patient, the purpose of this procedure and the potential benefits and risks involved with this procedure.  The benefits include less needle sticks, lab draws from the catheter, and the patient may be discharged home with the catheter. Risks include, but not limited to, infection, bleeding, blood clot (thrombus formation), and puncture of an artery; nerve damage and irregular heartbeat and possibility to perform a PICC exchange if needed/ordered by physician.  Alternatives to this procedure were also discussed.  Bard Power PICC patient education guide, fact sheet on infection prevention and patient information card has been provided to patient /or left at bedside.    PICC/Midline Placement Documentation  PICC Single Lumen 11/05/19 PICC Right Brachial 35 cm 2 cm (Active)  Indication for Insertion or Continuance of Line Poor Vasculature-patient has had multiple peripheral attempts or PIVs lasting less than 24 hours 11/05/19 1100  Exposed Catheter (cm) 2 cm 11/05/19 1100  Site Assessment Clean;Dry;Intact 11/05/19 1100  Line Status [place]ine locked;Flushed;Blood return noted 11/05/19 1100  Dressing Type Transparent 11/05/19 1100  Dressing Status Clean;Dry;Intact;Antimicrobial disc in place 11/05/19 [place] checked and tightened;Tubing changed 11/05/19 1100  Line Adjustment (NICU/IV Team Only) No 11/05/19 1100  Dressing Intervention New dressing 11/05/19 1100  Dressing Change Due 11/12/19 11/05/19 1100       Rolena Infante 11/05/2019, 11:01 AM

## 2019-11-05 NOTE — Progress Notes (Signed)
Patient ID: Julie Rollins, female   DOB: 25-Aug-1946, 73 y.o.   MRN: [id number] [facility] Progress Note:   4 Days Post-Op  Subjective: Mental status is agitated due to pain in IV site with attempted administration of meds;  vomiting Objective: Vital signs in last 24 hours: Temp:  [97.9 F (36.6 C)-98.3 F (36.8 C)] 97.9 F (36.6 C) (12/12 0452) Pulse Rate:  [118-119] 119 (12/12 0452) Resp:  [15-20] 16 (12/12 0452) BP: (109-127)/(63-69) 126/67 (12/12 0452) SpO2:  [96 %-97 %] 97 % (12/12 0452)  Intake/Output from previous day: 12/11 0701 - 12/12 0700 In: 563.3 [NG/GT:563.3] Out: 751 [Urine:350; Emesis/NG output:100; Drains:300; Stool:1] Intake/Output this shift: No intake/output data recorded.  Physical Exam: Work of breathing is not labored.  G tube with brown liquid.  J tube has been running at 50cc / hr.  Vomiting  Lab Results:  Results for orders placed or performed during the hospital encounter of 10/17/2019 (from the past 48 hour(s))  Glucose, capillary     Status: Abnormal   Collection Time: 11/03/19 11:42 AM  Result Value Ref Range   Glucose-Capillary 128 (H) 70 - 99 mg/dL  Glucose, capillary     Status: Abnormal   Collection Time: 11/03/19  4:52 PM  Result Value Ref Range   Glucose-Capillary 65 (L) 70 - 99 mg/dL   Comment 1 Notify RN    Comment 2 Document in Chart   Glucose, capillary     Status: Abnormal   Collection Time: 11/03/19  5:23 PM  Result Value Ref Range   Glucose-Capillary 131 (H) 70 - 99 mg/dL  Glucose, capillary     Status: Abnormal   Collection Time: 11/03/19  7:34 PM  Result Value Ref Range   Glucose-Capillary 119 (H) 70 - 99 mg/dL  Glucose, capillary     Status: Abnormal   Collection Time: 11/04/19 12:26 AM  Result Value Ref Range   Glucose-Capillary 128 (H) 70 - 99 mg/dL  Glucose, capillary     Status: None   Collection Time: 11/04/19  4:41 AM  Result Value Ref Range   Glucose-Capillary 91 70 - 99 mg/dL  CBC     Status: Abnormal    Collection Time: 11/04/19  7:24 AM  Result Value Ref Range   WBC 11.2 (H) 4.0 - 10.5 K/uL   RBC 2.84 (L) 3.87 - 5.11 MIL/uL   Hemoglobin 8.8 (L) 12.0 - 15.0 g/dL   HCT 27.5 (L) 36.0 - 46.0 %   MCV 96.8 80.0 - 100.0 fL   MCH 31.0 26.0 - 34.0 pg   MCHC 32.0 30.0 - 36.0 g/dL   RDW 14.9 11.5 - 15.5 %   Platelets 442 (H) 150 - 400 K/uL   nRBC 0.0 0.0 - 0.2 %    Comment: Performed at [facility], [address] [place], [place] [address]  Glucose, capillary     Status: Abnormal   Collection Time: 11/04/19  8:28 AM  Result Value Ref Range   Glucose-Capillary 134 (H) 70 - 99 mg/dL  Glucose, capillary     Status: Abnormal   Collection Time: 11/04/19 12:01 PM  Result Value Ref Range   Glucose-Capillary 131 (H) 70 - 99 mg/dL  Glucose, capillary     Status: Abnormal   Collection Time: 11/04/19  4:27 PM  Result Value Ref Range   Glucose-Capillary 134 (H) 70 - 99 mg/dL  Glucose, capillary     Status: Abnormal   Collection Time: 11/04/19  7:34 PM  Result Value Ref Range   Glucose-Capillary 165 (H) 70 - 99 mg/dL  Glucose, capillary     Status: Abnormal   Collection Time: 11/05/19 12:42 AM  Result Value Ref Range   Glucose-Capillary 141 (H) 70 - 99 mg/dL  Glucose, capillary     Status: Abnormal   Collection Time: 11/05/19  3:31 AM  Result Value Ref Range   Glucose-Capillary 137 (H) 70 - 99 mg/dL  Glucose, capillary     Status: Abnormal   Collection Time: 11/05/19  7:40 AM  Result Value Ref Range   Glucose-Capillary 128 (H) 70 - 99 mg/dL    Radiology/Results: No results found.  Anti-infectives: Anti-infectives (From admission, onward)   Start     Dose/Rate Route Frequency Ordered Stop   10/30/2019 1410  ceFAZolin (ANCEF) 2-4 GM/100ML-% IVPB    Note to Pharmacy: Claybon Jabs   : cabinet override      11/23/2019 1410 11/02/19 0214   10/29/19 0945  erythromycin 250 mg in sodium chloride 0.9 % 100 mL IVPB  Status:  Discontinued     250 mg 100 mL/hr over 60 Minutes Intravenous  Every 8 hours 10/29/19 0936 10/27/2019 1737   10/02/19 1858  vancomycin (VANCOCIN) IVPB 1000 mg/200 mL premix     1,000 mg 200 mL/hr over 60 Minutes Intravenous Every 12 hours 10/02/19 1533 10/05/19 1944   09/29/19 0630  vancomycin (VANCOCIN) IVPB 1000 mg/200 mL premix  Status:  Discontinued     1,000 mg 200 mL/hr over 60 Minutes Intravenous Every 24 hours 09/29/19 0625 10/02/19 1533   09/29/19 0615  ceFEPIme (MAXIPIME) 2 g in sodium chloride 0.9 % 100 mL IVPB     2 g 200 mL/hr over 30 Minutes Intravenous Every 12 hours 09/29/19 0605 10/05/19 2300   10/12/2019 2200  ceFAZolin (ANCEF) IVPB 2g/100 mL premix     2 g 200 mL/hr over 30 Minutes Intravenous Every 8 hours 10/16/2019 1523 09/28/19 1900   10/22/2019 0600  ceFAZolin (ANCEF) IVPB 2g/100 mL premix     2 g 200 mL/hr over 30 Minutes Intravenous On call to O.R. 10/05/2019 0550 10/17/2019 1338   10/13/2019 0558  ceFAZolin (ANCEF) 2-4 GM/100ML-% IVPB    Note to Pharmacy: Marga Melnick   : cabinet override      10/19/2019 0558 10/13/2019 1814      Assessment/Plan: Problem List: Patient Active Problem List   Diagnosis Date Noted  . Malnutrition of moderate degree 10/30/2019  . Gastric ileus 10/29/2019  . Hypokalemia 10/29/2019  . Hypomagnesemia 10/29/2019  . Acute esophagitis   . Sepsis due to undetermined organism ([place]) 07/14/2019  . Hyponatremia   . Protein-calorie malnutrition, severe ([place]) 06/22/2019  . Encounter for nasogastric (NG) tube placement   . Partial gastric outlet obstruction   . Intractable nausea and vomiting 06/13/2019  . Genetic testing 06/08/2019  . BRCA2 gene mutation positive in female 06/08/2019  . Family history of bladder cancer   . Family history of stomach cancer   . Family history of throat cancer   . Personal history of breast cancer   . Port-A-Cath in place 04/27/2019  . Carcinoma of head of pancreas s/p Whiplle pancreaticoduodenectomy 10/04/2019 04/04/2019  . Elevated LFTs 03/28/2019  . Hypothyroidism 03/28/2019   . Diarrhea 03/28/2019  . Generalized weakness   . Jaundice   . Abnormal finding on GI tract imaging   . Elevated alkaline phosphatase level   . Cervical spine fracture ([place]) 07/31/2018    Will request PIC line;  Hold tube feeds until noon then restart at 25/hr.   4 Days Post-Op    LOS: 39 days   Matt B. Hassell Done, MD, [facility] [phone] beeper [phone]  11/05/2019 8:53 AM

## 2019-11-05 NOTE — Progress Notes (Signed)
Patient vomited x1 green in color, unable to measure. Feedings still on hold. Gave PRN promethazine as ordered. Paged MD. Will continue to monitor.

## 2019-11-05 NOTE — Progress Notes (Addendum)
Daily Progress Note   Patient Name: Julie Rollins       Date: 11/05/2019 DOB: 11-03-1946  Age: 73 y.o. MRN#: [id number] Attending Physician: Stark Klein, MD Primary Care Physician: Nolene Ebbs, MD Admit Date: 10/18/2019  Reason for Consultation/Follow-up: Establishing goals of care, Non pain symptom management and Psychosocial/spiritual support  Subjective: 40 min conversation held with patient's best friend of 94 years, Julie Rollins.  Julie Rollins describes Julie Rollins as organized, independent and a Insurance account manager.  Julie Rollins has always been very directive - tells the complex manager when the gutters need to be cleaned, tell Julie Rollins how to drive and when to turn.  Julie Rollins is down to earth.  Things are black and white.  Julie Rollins comments that Julie Rollins just wants things to be normal again.   Julie Rollins thought they would cut out the tumor and she would improve as she did with her breast cancer.     Nursing notes reviewed.  Patient with multiple episodes of vomiting.  Tube feeds held and will be restarted at a slower rate.    Patient speaks with me when I enter the room.  She got up today and used the bathroom.   She appears exhausted.  After we chatted for a moment I told her I would visit again tomorrow.   Assessment: Patient vomiting and exhausted.   Patient Profile/HPI:  73 y.o. female  with past medical history of breast cancer, hypothyroidism, anxiety and pancreatic cancer who was admitted on 09/29/2019.  She is now s/p Whipple procedure 10/22/2019.  Per notes she had clean margins but has residual tumor and 2 positive lymph nodes.  She experienced delayed gastric emptying post procedure and was unable to take PO nutrition.  Gastrostomy and jejunostomy tube wasp laced.  The patient continues to have failure to thrive.  PMT was  asked to consult for symptom management.    Length of Stay: 39  Current Medications: Scheduled Meds:   acetaminophen  650 mg Oral Q8H   chlorhexidine  15 mL Mouth Rinse BID   Chlorhexidine Gluconate Cloth  6 each Topical Daily   dexamethasone (DECADRON) injection  4 mg Intravenous Once   enoxaparin (LOVENOX) injection  40 mg Subcutaneous Q24H   feeding supplement (PRO-STAT SUGAR FREE 64)  30 mL Per Tube BID   insulin aspart  2-6 Units Subcutaneous  Q4H   insulin detemir  5 Units Subcutaneous Q12H   levothyroxine  50 mcg Per Tube Q0600   lidocaine  1 patch Transdermal Q24H   lip balm  1 application Topical BID   loperamide HCl  2 mg Oral QHS   mouth rinse  15 mL Mouth Rinse q12n4p   OLANZapine  2.5 mg Oral QHS   ondansetron (ZOFRAN) IV  4 mg Intravenous TID AC   pantoprazole sodium  40 mg Per Tube BID   sertraline  25 mg Oral QHS   sodium chloride flush  10-40 mL Intracatheter Q12H    Continuous Infusions:  sodium chloride Stopped (10/03/19 0942)   dextrose 5 % and 0.9% NaCl Stopped (10/29/19 1029)   feeding supplement (OSMOLITE 1.5 CAL)      PRN Meds: sodium chloride, acetaminophen, alum & mag hydroxide-simeth, diphenhydrAMINE **OR** diphenhydrAMINE, Gerhardt's butt cream, guaiFENesin-dextromethorphan, hydrocortisone, hydrocortisone cream, HYDROmorphone (DILAUDID) injection, loperamide HCl, LORazepam, magic mouthwash, menthol-cetylpyridinium, metoprolol tartrate, ondansetron **OR** ondansetron (ZOFRAN) IV, oxyCODONE, phenol, promethazine, sodium chloride flush  Physical Exam        Chronically ill appearing female, awake, alert, orientated CV tachy and regular Resp no distress   Vital Signs: BP 126/67 (BP Location: Right Arm)    Pulse (!) 119    Temp 97.9 F (36.6 C) (Oral)    Resp 16    Ht '5\' 4"'$  (1.626 m)    Wt 73.5 kg    LMP  (LMP Unknown)    SpO2 97%    BMI 27.81 kg/m  SpO2: SpO2: 97 % O2 Device: O2 Device: Room Air O2 Flow Rate: O2 Flow Rate  (L/min): 2 L/min  Intake/output summary:   Intake/Output Summary (Last 24 hours) at 11/05/2019 0947 Last data filed at 11/05/2019 0506 Gross per 24 hour  Intake 563.33 ml  Output 751 ml  Net -187.67 ml   LBM: Last BM Date: 11/04/19 Baseline Weight: Weight: 59.8 kg Most recent weight: Weight: 73.5 kg       Palliative Assessment/Data: 40%      Patient Active Problem List   Diagnosis Date Noted   Malnutrition of moderate degree 10/30/2019   Gastric ileus 10/29/2019   Hypokalemia 10/29/2019   Hypomagnesemia 10/29/2019   Acute esophagitis    Sepsis due to undetermined organism ([place]) 07/14/2019   Hyponatremia    Protein-calorie malnutrition, severe ([place]) 06/22/2019   Encounter for nasogastric (NG) tube placement    Partial gastric outlet obstruction    Intractable nausea and vomiting 06/13/2019   Genetic testing 06/08/2019   BRCA2 gene mutation positive in female 06/08/2019   Family history of bladder cancer    Family history of stomach cancer    Family history of throat cancer    Personal history of breast cancer    Port-A-Cath in place 04/27/2019   Carcinoma of head of pancreas s/p Whiplle pancreaticoduodenectomy 10/10/2019 04/04/2019   Elevated LFTs 03/28/2019   Hypothyroidism 03/28/2019   Diarrhea 03/28/2019   Generalized weakness    Jaundice    Abnormal finding on GI tract imaging    Elevated alkaline phosphatase level    Cervical spine fracture ([facility]) 07/31/2018    Palliative Care Plan    Recommendations/Plan:  Will add a dose of decadron today to decrease inflammation and increase energy level.  Need to discuss this with Dr. Barry Dienes before making it a longer term order.    Need to discuss status of pancreatic cancer with Dr. Barry Dienes and Dr. [name].  Patient tells me her cancer is "  Gone".  I question if she has not understood what her physicians have told her, but I feel it is important for her to accurately understand the status of the  pancreatic cancer (positive nodes and residual tumor).  Will dc zoloft and trazodone in favor of olanzapine which should help with sleep, nausea, and appetite.  Recommend weaning IV dilaudid as I worry it is inhibiting her bowel motility. I discussed this with the patient and her RN and I will decrease the dose to 0.5 mg.  Goals of Care and Additional Recommendations:  Limitations on Scope of Treatment: Full Scope Treatment   Code Status:  Full code - to be discussed.  Prognosis:   Unable to determine at high risk for decline and death given very fragile state, severe malnutrition, pancreatic cancer and inability to take chemotherapy.   Discharge Planning:  To Be Determined  Care plan was discussed with patient's contact Julie Rollins.  Thank you for allowing the Palliative Medicine Team to assist in the care of this patient.  Total time spent:  75 min.  Time in 8:30 Time out 9:45     Greater than 50%  of this time was spent counseling and coordinating care related to the above assessment and plan.  Florentina Jenny, PA-C Palliative Medicine  Please contact Palliative MedicineTeam phone at [phone] for questions and concerns between 7 am - 7 pm.   Please see AMION for individual provider pager numbers.

## 2019-11-06 LAB — GLUCOSE, CAPILLARY
Glucose-Capillary: 103 mg/dL — ABNORMAL HIGH (ref 70–99)
Glucose-Capillary: 108 mg/dL — ABNORMAL HIGH (ref 70–99)
Glucose-Capillary: 117 mg/dL — ABNORMAL HIGH (ref 70–99)
Glucose-Capillary: 120 mg/dL — ABNORMAL HIGH (ref 70–99)
Glucose-Capillary: 126 mg/dL — ABNORMAL HIGH (ref 70–99)
Glucose-Capillary: 139 mg/dL — ABNORMAL HIGH (ref 70–99)
Glucose-Capillary: 144 mg/dL — ABNORMAL HIGH (ref 70–99)

## 2019-11-06 MED ORDER — OLANZAPINE 5 MG PO TABS
5.0000 mg | ORAL_TABLET | Freq: Every day | ORAL | Status: DC
  Administered 2019-11-06 – 2019-11-09 (×4): 5 mg via ORAL
  Filled 2019-11-06 (×4): qty 1

## 2019-11-06 MED ORDER — OSMOLITE 1.5 CAL PO LIQD
1000.0000 mL | ORAL | Status: DC
  Administered 2019-11-06 (×2): 1000 mL
  Filled 2019-11-06 (×2): qty 1000

## 2019-11-06 MED ORDER — DEXAMETHASONE SODIUM PHOSPHATE 4 MG/ML IJ SOLN
4.0000 mg | Freq: Once | INTRAMUSCULAR | Status: AC
  Administered 2019-11-06: 13:00:00 4 mg via INTRAVENOUS
  Filled 2019-11-06: qty 1

## 2019-11-06 NOTE — Progress Notes (Signed)
Patient ID: Julie Rollins, female   DOB: 12-11-1945, 73 y.o.   MRN: [id number] [facility] Progress Note:   5 Days Post-Op  Subjective: Mental status is clear;  She has nausea but is not really vomiting.  Objective: Vital signs in last 24 hours: Temp:  [97.7 F (36.5 C)-98.3 F (36.8 C)] 97.7 F (36.5 C) (12/13 0419) Pulse Rate:  [115-122] 115 (12/13 0419) Resp:  [18-20] 18 (12/13 0419) BP: (132-139)/(67-71) 139/67 (12/13 0419) SpO2:  [95 %-97 %] 95 % (12/13 0419) Weight:  [70.1 kg] 70.1 kg (12/13 0419)  Intake/Output from previous day: 12/12 0701 - 12/13 0700 In: -  Out: 350 [Urine:150; Drains:200] Intake/Output this shift: No intake/output data recorded.  Physical Exam: Work of breathing is OK.  G tube draining and J tube getting 25 cc / hr of tube feeding.    Lab Results:  Results for orders placed or performed during the hospital encounter of 10/20/2019 (from the past 48 hour(s))  Glucose, capillary     Status: Abnormal   Collection Time: 11/04/19 12:01 PM  Result Value Ref Range   Glucose-Capillary 131 (H) 70 - 99 mg/dL  Glucose, capillary     Status: Abnormal   Collection Time: 11/04/19  4:27 PM  Result Value Ref Range   Glucose-Capillary 134 (H) 70 - 99 mg/dL  Glucose, capillary     Status: Abnormal   Collection Time: 11/04/19  7:34 PM  Result Value Ref Range   Glucose-Capillary 165 (H) 70 - 99 mg/dL  Glucose, capillary     Status: Abnormal   Collection Time: 11/05/19 12:42 AM  Result Value Ref Range   Glucose-Capillary 141 (H) 70 - 99 mg/dL  Glucose, capillary     Status: Abnormal   Collection Time: 11/05/19  3:31 AM  Result Value Ref Range   Glucose-Capillary 137 (H) 70 - 99 mg/dL  Glucose, capillary     Status: Abnormal   Collection Time: 11/05/19  7:40 AM  Result Value Ref Range   Glucose-Capillary 128 (H) 70 - 99 mg/dL  Glucose, capillary     Status: Abnormal   Collection Time: 11/05/19 11:33 AM  Result Value Ref Range   Glucose-Capillary  151 (H) 70 - 99 mg/dL  Glucose, capillary     Status: Abnormal   Collection Time: 11/05/19  4:17 PM  Result Value Ref Range   Glucose-Capillary 118 (H) 70 - 99 mg/dL  Glucose, capillary     Status: Abnormal   Collection Time: 11/05/19  8:06 PM  Result Value Ref Range   Glucose-Capillary 143 (H) 70 - 99 mg/dL  Glucose, capillary     Status: Abnormal   Collection Time: 11/05/19 11:59 PM  Result Value Ref Range   Glucose-Capillary 126 (H) 70 - 99 mg/dL  Glucose, capillary     Status: Abnormal   Collection Time: 11/06/19  4:15 AM  Result Value Ref Range   Glucose-Capillary 120 (H) 70 - 99 mg/dL  Glucose, capillary     Status: Abnormal   Collection Time: 11/06/19  7:48 AM  Result Value Ref Range   Glucose-Capillary 108 (H) 70 - 99 mg/dL    Radiology/Results: [place] EKG SITE RITE  Result Date: 11/05/2019 If Site Rite image not attached, placement could not be confirmed due to current cardiac rhythm.   Anti-infectives: Anti-infectives (From admission, onward)   Start     Dose/Rate Route Frequency Ordered Stop   11/22/2019 1410  ceFAZolin (ANCEF) 2-4 GM/100ML-% IVPB    Note to  Pharmacy: Claybon Jabs   : cabinet override      10/31/2019 1410 11/02/19 0214   10/29/19 0945  erythromycin 250 mg in sodium chloride 0.9 % 100 mL IVPB  Status:  Discontinued     250 mg 100 mL/hr over 60 Minutes Intravenous Every 8 hours 10/29/19 0936 11/16/2019 1737   10/02/19 1858  vancomycin (VANCOCIN) IVPB 1000 mg/200 mL premix     1,000 mg 200 mL/hr over 60 Minutes Intravenous Every 12 hours 10/02/19 1533 10/05/19 1944   09/29/19 0630  vancomycin (VANCOCIN) IVPB 1000 mg/200 mL premix  Status:  Discontinued     1,000 mg 200 mL/hr over 60 Minutes Intravenous Every 24 hours 09/29/19 0625 10/02/19 1533   09/29/19 0615  ceFEPIme (MAXIPIME) 2 g in sodium chloride 0.9 % 100 mL IVPB     2 g 200 mL/hr over 30 Minutes Intravenous Every 12 hours 09/29/19 0605 10/05/19 2300   10/20/2019 2200  ceFAZolin (ANCEF) IVPB  2g/100 mL premix     2 g 200 mL/hr over 30 Minutes Intravenous Every 8 hours 10/19/2019 1523 09/28/19 1900   10/06/2019 0600  ceFAZolin (ANCEF) IVPB 2g/100 mL premix     2 g 200 mL/hr over 30 Minutes Intravenous On call to O.R. 10/19/2019 0550 10/03/2019 1338   10/09/2019 0558  ceFAZolin (ANCEF) 2-4 GM/100ML-% IVPB    Note to Pharmacy: Marga Melnick   : cabinet override      10/09/2019 0558 09/26/2019 1814      Assessment/Plan: Problem List: Patient Active Problem List   Diagnosis Date Noted  . Palliative care encounter   . Malnutrition of moderate degree 10/30/2019  . Gastric ileus 10/29/2019  . Hypokalemia 10/29/2019  . Hypomagnesemia 10/29/2019  . Acute esophagitis   . Sepsis due to undetermined organism ([place]) 07/14/2019  . Hyponatremia   . Protein-calorie malnutrition, severe ([place]) 06/22/2019  . Encounter for nasogastric (NG) tube placement   . Partial gastric outlet obstruction   . Intractable nausea and vomiting 06/13/2019  . Genetic testing 06/08/2019  . BRCA2 gene mutation positive in female 06/08/2019  . Family history of bladder cancer   . Family history of stomach cancer   . Family history of throat cancer   . Personal history of breast cancer   . Port-A-Cath in place 04/27/2019  . Carcinoma of head of pancreas s/p Whiplle pancreaticoduodenectomy 10/22/2019 04/04/2019  . Elevated LFTs 03/28/2019  . Hypothyroidism 03/28/2019  . Diarrhea 03/28/2019  . Generalized weakness   . Jaundice   . Abnormal finding on GI tract imaging   . Elevated alkaline phosphatase level   . Cervical spine fracture ([place]) 07/31/2018    Will increase TF to 40.  Her nausea may be related to her underlying disease and prior chemo/RT 5 Days Post-Op    LOS: 40 days   Matt B. Hassell Done, MD, [facility] [phone] beeper [phone]  11/06/2019 9:36 AM

## 2019-11-06 NOTE — Progress Notes (Signed)
   11/06/19 1709  Clinical Encounter Type  Visited With Patient;Health care provider  Visit Type Initial  Referral From Nurse   Chaplain responded to a consult. Chaplain met with Julie Rollins, and briefly reviewed her situation (single, lives alone, long hospitalization, etc.). Chaplain introduced spiritual care services. Spiritual care services available as needed.   Jeri Lager, Chaplain

## 2019-11-06 NOTE — Progress Notes (Signed)
Patient's wound vac canister was filled with 500 ml of serosanguineous drainage. This RN has to change the canister and noticed new canister placed filling up quickly. Oncall MD made aware. Will continue to monitor patient.

## 2019-11-06 NOTE — Progress Notes (Signed)
Daily Progress Note   Patient Name: Julie Rollins       Date: 11/06/2019 DOB: 1946-05-19  Age: 73 y.o. MRN#: [id number] Attending Physician: Stark Klein, MD Primary Care Physician: Nolene Ebbs, MD Admit Date: 10/17/2019  Reason for Consultation/Follow-up: Establishing goals of care and Psychosocial/spiritual support  Subjective: Met with patient at bedside.  She looks brighter today.  She has just had a bath.  She immediately asks me for medicine to relax.  "I need to sleep".  She denies pain.    I asked how she was doing.  She replied so-so.  I asked if I could speak with her about her cancer.  She mentioned that the cancer doctor had come to see her last week.  I asked what Dr. [name] told her about her cancer.  She replied "It's gone, she told me it's gone.  Dr. Barry Dienes told me its gone too."     I talked with her about the pathology report and 2 positive lymph nodes.  I explained that that means the cancer is not gone.  She asked "Why are you telling me this now?"  I said that I understand she is a straight forward organized woman and I wanted to be honest with her about the cancer.  I would want someone to be honest with me.  We talked about her continued nausea.  She vomited again today (a smaller amount than yesterday).  I told her I would increase the nausea medicine I was giving her at night (Olanzapine).  She said she had slept well last night.    Then she requested that I turn out the lights and shut the door when I leave.    I spoke with her best friend [name] on the phone and described the conversation to her.  [name] commented that Analissa is always asking for medicine to relax and sleep "All she wants to do is sleep".  [name] said she would come visit Leiliana today.   Assessment: Still  with significant nausea.  She reports vomiting today (less than yesterday).  Denies pain even with decreased dilaudid dosing (and PRN doses did not increase).  Flat affect.  Appears depressed.   Patient Profile/HPI:  73 y.o. female  with past medical history of breast cancer, hypothyroidism, anxiety and pancreatic cancer who was admitted  on 10/12/2019.  She is now s/p Whipple procedure 10/12/2019.  Per notes she had clean margins but has residual tumor and 2 positive lymph nodes.  She experienced delayed gastric emptying post procedure and was unable to take PO nutrition.  Gastrostomy and jejunostomy tube wasp laced.  The patient continues to have failure to thrive.  PMT was asked to consult for symptom management.    Length of Stay: 40  Current Medications: Scheduled Meds:  . acetaminophen  650 mg Oral Q8H  . chlorhexidine  15 mL Mouth Rinse BID  . Chlorhexidine Gluconate Cloth  6 each Topical Daily  . enoxaparin (LOVENOX) injection  40 mg Subcutaneous Q24H  . feeding supplement (PRO-STAT SUGAR FREE 64)  30 mL Per Tube BID  . insulin aspart  2-6 Units Subcutaneous Q4H  . insulin detemir  5 Units Subcutaneous Q12H  . levothyroxine  50 mcg Per Tube Q0600  . lidocaine  1 patch Transdermal Q24H  . lip balm  1 application Topical BID  . loperamide HCl  2 mg Oral QHS  . mouth rinse  15 mL Mouth Rinse q12n4p  . OLANZapine  2.5 mg Oral QHS  . ondansetron (ZOFRAN) IV  4 mg Intravenous TID AC  . pantoprazole sodium  40 mg Per Tube BID  . sodium chloride flush  10-40 mL Intracatheter Q12H  . sodium chloride flush  10-40 mL Intracatheter Q12H    Continuous Infusions: . sodium chloride Stopped (10/03/19 0942)  . dextrose 5 % and 0.9% NaCl Stopped (10/29/19 1029)  . feeding supplement (OSMOLITE 1.5 CAL) 1,000 mL (11/06/19 1009)    PRN Meds: sodium chloride, acetaminophen, alum & mag hydroxide-simeth, diphenhydrAMINE **OR** diphenhydrAMINE, Gerhardt's butt cream, guaiFENesin-dextromethorphan,  hydrocortisone, hydrocortisone cream, HYDROmorphone (DILAUDID) injection, loperamide HCl, LORazepam, magic mouthwash, menthol-cetylpyridinium, metoprolol tartrate, ondansetron **OR** ondansetron (ZOFRAN) IV, oxyCODONE, phenol, promethazine, sodium chloride flush, sodium chloride flush  Physical Exam        Chronically ill appearing female, awake, alert, flat affect  CV tachy resp no distress Extremities - edema noted in all 4  Vital Signs: BP 139/67 (BP Location: Left Arm)   Pulse (!) 115   Temp 97.7 F (36.5 C) (Oral)   Resp 18   Ht 5' 4" (1.626 m)   Wt 70.1 kg   LMP  (LMP Unknown)   SpO2 95%   BMI 26.53 kg/m  SpO2: SpO2: 95 % O2 Device: O2 Device: Room Air O2 Flow Rate: O2 Flow Rate (L/min): 2 L/min  Intake/output summary:   Intake/Output Summary (Last 24 hours) at 11/06/2019 1122 Last data filed at 11/06/2019 1046 Gross per 24 hour  Intake --  Output 725 ml  Net -725 ml   LBM: Last BM Date: 11/04/19 Baseline Weight: Weight: 59.8 kg Most recent weight: Weight: 70.1 kg       Palliative Assessment/Data:  40%      Patient Active Problem List   Diagnosis Date Noted  . Palliative care encounter   . Malnutrition of moderate degree 10/30/2019  . Gastric ileus 10/29/2019  . Hypokalemia 10/29/2019  . Hypomagnesemia 10/29/2019  . Acute esophagitis   . Sepsis due to undetermined organism ([place]) 07/14/2019  . Hyponatremia   . Protein-calorie malnutrition, severe ([place]) 06/22/2019  . Encounter for nasogastric (NG) tube placement   . Partial gastric outlet obstruction   . Intractable nausea and vomiting 06/13/2019  . Genetic testing 06/08/2019  . BRCA2 gene mutation positive in female 06/08/2019  . Family history of bladder cancer   .  Family history of stomach cancer   . Family history of throat cancer   . Personal history of breast cancer   . Port-A-Cath in place 04/27/2019  . Carcinoma of head of pancreas s/p Whiplle pancreaticoduodenectomy 10/17/2019 04/04/2019  .  Elevated LFTs 03/28/2019  . Hypothyroidism 03/28/2019  . Diarrhea 03/28/2019  . Generalized weakness   . Jaundice   . Abnormal finding on GI tract imaging   . Elevated alkaline phosphatase level   . Cervical spine fracture ([place]) 07/31/2018    Palliative Care Plan    Recommendations/Plan:  Symptom management  Nausea, sleep and mood will increase Olanzapine to 5 mg qhs.  Nausea, pain, energy level / outlook will give a second daily dose of IV decadron.     Please discuss this with Dr. Barry Dienes before giving further doses.   Goals of care  Initiated discussion of residual pancreatic cancer.  I believe patient has been in denial.  She needs time to process this information.  Once she understands that she has metastatic cancer then she will be in a better place to make educated decisions about her care going forward.  Code status needs to be re-discussed at the appropriate time.  Patient has very specific hand written instructions in her Advanced Directive that would lead me to believe she would choose "DNR/with full scope treatment" at this point - but this conversation needs to be had.  I will be off for the next several days but will request that a PMT colleague pick up her care.  Goals of Care and Additional Recommendations:  Limitations on Scope of Treatment: Full Scope Treatment  Code Status:  Full code  Prognosis:   Unable to determine.  She is at high risk for decline and death given very fragile state, severe malnutrition, recent surgery, metastatic pancreatic cancer and inability to take chemotherapy.   Discharge Planning:  To Be Determined.  Patient has been to [facility] in the past (when she had neck fracture last year) and did ok there.  Care plan was discussed with [name].  Thank you for allowing the Palliative Medicine Team to assist in the care of this patient.  Total time spent:  35 min.     Greater than 50%  of this time was spent counseling  and coordinating care related to the above assessment and plan.  Florentina Jenny, PA-C Palliative Medicine  Please contact Palliative MedicineTeam phone at [phone] for questions and concerns between 7 am - 7 pm.   Please see AMION for individual provider pager numbers.

## 2019-11-06 NOTE — Progress Notes (Signed)
Patient ID: Julie Rollins, female   DOB: 1946/04/01, 73 y.o.   MRN: [id number] Notified by RN of large serosanguinous drainage from [name]. Prevena in place and fluid looks like ascites. I removed the Prevena. Some ascites leaking between 2 staples. Wound otherwise OK. Instructed RN to place bulky dry dressing and change as needed.  Georganna Skeans, MD, MPH, FACS Please use [email or web address] to contact on call provider

## 2019-11-07 DIAGNOSIS — R110 Nausea: Secondary | ICD-10-CM

## 2019-11-07 LAB — COMPREHENSIVE METABOLIC PANEL
ALT: 10 U/L (ref 0–44)
AST: 11 U/L — ABNORMAL LOW (ref 15–41)
Albumin: 1.4 g/dL — ABNORMAL LOW (ref 3.5–5.0)
Alkaline Phosphatase: 114 U/L (ref 38–126)
Anion gap: 7 (ref 5–15)
BUN: 18 mg/dL (ref 8–23)
CO2: 28 mmol/L (ref 22–32)
Calcium: 7.8 mg/dL — ABNORMAL LOW (ref 8.9–10.3)
Chloride: 100 mmol/L (ref 98–111)
Creatinine, Ser: 0.71 mg/dL (ref 0.44–1.00)
GFR calc Af Amer: >60 mL/min (ref >60)
GFR calc non Af Amer: >60 mL/min (ref >60)
Glucose, Bld: 115 mg/dL — ABNORMAL HIGH (ref 70–99)
Potassium: 4.2 mmol/L (ref 3.5–5.1)
Sodium: 135 mmol/L (ref 135–145)
Total Bilirubin: 0.2 mg/dL — ABNORMAL LOW (ref 0.3–1.2)
Total Protein: 4.8 g/dL — ABNORMAL LOW (ref 6.5–8.1)

## 2019-11-07 LAB — CBC WITH DIFFERENTIAL/PLATELET
Abs Immature Granulocytes: 0.04 10*3/uL (ref 0.00–0.07)
Basophils Absolute: 0 10*3/uL (ref 0.0–0.1)
Basophils Relative: 0 %
Eosinophils Absolute: 0 10*3/uL (ref 0.0–0.5)
Eosinophils Relative: 0 %
HCT: 25.1 % — ABNORMAL LOW (ref 36.0–46.0)
Hemoglobin: 8.1 g/dL — ABNORMAL LOW (ref 12.0–15.0)
Immature Granulocytes: 0 %
Lymphocytes Relative: 8 %
Lymphs Abs: 0.9 10*3/uL (ref 0.7–4.0)
MCH: 30.6 pg (ref 26.0–34.0)
MCHC: 32.3 g/dL (ref 30.0–36.0)
MCV: 94.7 fL (ref 80.0–100.0)
Monocytes Absolute: 1.1 10*3/uL — ABNORMAL HIGH (ref 0.1–1.0)
Monocytes Relative: 11 %
Neutro Abs: 8.5 10*3/uL — ABNORMAL HIGH (ref 1.7–7.7)
Neutrophils Relative %: 81 %
Platelets: 453 10*3/uL — ABNORMAL HIGH (ref 150–400)
RBC: 2.65 MIL/uL — ABNORMAL LOW (ref 3.87–5.11)
RDW: 14.9 % (ref 11.5–15.5)
WBC: 10.5 10*3/uL (ref 4.0–10.5)
nRBC: 0 % (ref 0.0–0.2)

## 2019-11-07 LAB — GLUCOSE, CAPILLARY
Glucose-Capillary: 102 mg/dL — ABNORMAL HIGH (ref 70–99)
Glucose-Capillary: 99 mg/dL (ref 70–99)
Glucose-Capillary: 113 mg/dL — ABNORMAL HIGH (ref 70–99)
Glucose-Capillary: 112 mg/dL — ABNORMAL HIGH (ref 70–99)
Glucose-Capillary: 100 mg/dL — ABNORMAL HIGH (ref 70–99)

## 2019-11-07 LAB — MAGNESIUM: Magnesium: 2.1 mg/dL (ref 1.7–2.4)

## 2019-11-07 LAB — PHOSPHORUS: Phosphorus: 3.4 mg/dL (ref 2.5–4.6)

## 2019-11-07 LAB — PREALBUMIN: Prealbumin: 9.1 mg/dL — ABNORMAL LOW (ref 18–38)

## 2019-11-07 MED ORDER — OSMOLITE 1.5 CAL PO LIQD
1000.0000 mL | ORAL | Status: DC
  Administered 2019-11-07: 22:00:00 1000 mL
  Filled 2019-11-07 (×2): qty 1000

## 2019-11-07 MED ORDER — GLYCOPYRROLATE 0.2 MG/ML IJ SOLN
0.4000 mg | Freq: Four times a day (QID) | INTRAMUSCULAR | Status: DC
  Administered 2019-11-07 – 2019-11-09 (×6): 0.4 mg via INTRAVENOUS
  Filled 2019-11-07 (×6): qty 2

## 2019-11-07 NOTE — Progress Notes (Signed)
Placed an Eakins pouch on Pt's mid abdominal area to contain large amounts of serosang drainage coming from the area in between the lower 1-2nd staples.  Hooked it up to a straight drainage bag.  Prepped skin beforehand with skin prep and placed mepilex as a border.Checked later and working well to keep skin dry.

## 2019-11-07 NOTE — Progress Notes (Signed)
Called Dr. Barry Dienes and left name/# for a call back.  Pt having very large amounts of serosang. Drainage from mid abd incision that has staples.  Soaked through multiple layers of ABD pads.

## 2019-11-07 NOTE — Progress Notes (Signed)
PT Cancellation Note  Patient Details Name: Julie Rollins MRN: [id number] DOB: [birth year]   Cancelled Treatment:    Reason Eval/Treat Not Completed: Medical issues which prohibited therapy. PT attempted x2, declined both times due to pt reports of sig nausea and pain in abdomen. Pt reports she is feeling too ill to attempt bed mobility or bed-level exercises at this time. PT will continue to follow as time/schedule allow.   Karma Ganja, PT, DPT   Acute Rehabilitation Department [phone]   Otho Bellows 11/07/2019, 2:24 PM

## 2019-11-07 NOTE — Progress Notes (Signed)
Palliative:  HPI: 73 yo female with PMH breast cancer, hypothyroidism, anxiety, pancreatic cancer admitted 10/20/2019 for whipple procedure. S/P whipple procedure with clean margins, residual tumor, and 2 positive lymph nodes. Hospitalization has been complicated by delayed gastric emptying and intolerance of po intake. GJ tube placed for drainage and nutrition. With ongoing nausea and fatigue.   I met today with Julie Rollins. She is lying very still and quiet in a dark room with vomit bag in front of her. She appears weak and fatigued. She feels "terrible" mainly from nausea. Denies pain. I entered and sat down to speak with her with her permission. I explained that I also work in palliative care and that I was told there was some confusion about her condition and status of her cancer. She confirms.   I explained that as far as I can tell the cancer was removed. HOWEVER, we know that due to the type of cancer, positive lymph nodes, and poor tolerance of chemotherapy that her cancer is extremely high risk of returning. We discussed the analogy of spilling a bucket of sand on the floor and sweeping up the sand but knowing there could be sand granules left on the floor and the leftover sand granules that we can't even see represent cancer cells in her body that could grow and cause her much more problems we just don't know when. She understands. I asked her if this sounds comparable to what she has discussed with Dr. [name] and she says it does.  I left her to process this information and she clearly was not in a place to speak more today so I told her that I would look over and see if there was anything I could add to assist with her nausea and I would visit with her again tomorrow.   All questions/concerns addressed. Emotional support provided.   Exam: Weak, frail, appears older than stated age. HR tachycardic. Breathing regular, unlabored. TF going. Abd with eakin's pouch and G tube to gravity.   Plan: - She just  wants to feel better. QOL of very poor.  - Nausea: Will add glycopyrrolate 0.4 mg IV QID and see if this decreases secretions to add to nausea control. Will reassess efficacy and if any benefit tomorrow.  - Will f/u tomorrow for ongoing [place] conversation.   [place], NP Palliative Medicine Team Pager [phone] (Please see [email or web address] for schedule) Team Phone [phone]    Greater than 50%  of this time was spent counseling and coordinating care related to the above assessment and plan

## 2019-11-07 NOTE — Progress Notes (Signed)
Eakins pouch to incisional site still intact and is hooked up to straight drainage bag.  It has drained at least 300 cc today of serosang drainage.

## 2019-11-07 NOTE — Progress Notes (Signed)
Subjective:     Feels "miserable and tired."      Objective:  Vital signs for last 24 hours: Temp:  [97.5 F (36.4 C)-97.8 F (36.6 C)] 97.8 F (36.6 C) (12/14 1504) Pulse Rate:  [102-109] 103 (12/14 1504) Resp:  [18] 18 (12/14 1504) BP: (119-124)/(56-60) 124/60 (12/14 1504) SpO2:  [96 %-100 %] 100 % (12/14 1504)  Intake/Output from previous day: 12/13 0701 - 12/14 0700 In: 40 [P.O.:40] Out: 1475 [Urine:500; Drains:975]  Intake/Output this shift: No intake/output data recorded.  Vent settings for last 24 hours:    Physical Exam:  Gen: awake, looks uncomfortable.   Neuro: no acute issues.   Pulm: unlabored breathing Abd: soft, approp tender at incision. eakin's pouch with serous output.  Tube feeds running.  G tube to gravity with bilious output.   Abd less distended.   Extr: wwp   Results for orders placed or performed during the hospital encounter of 10/16/2019 (from the past 24 hour(s))  Glucose, capillary     Status: Abnormal   Collection Time: 11/06/19  5:08 PM  Result Value Ref Range   Glucose-Capillary 117 (H) 70 - 99 mg/dL  Glucose, capillary     Status: Abnormal   Collection Time: 11/06/19  8:07 PM  Result Value Ref Range   Glucose-Capillary 139 (H) 70 - 99 mg/dL  Glucose, capillary     Status: Abnormal   Collection Time: 11/06/19 11:28 PM  Result Value Ref Range   Glucose-Capillary 144 (H) 70 - 99 mg/dL  Prealbumin QWeek     Status: Abnormal   Collection Time: 11/07/19  3:12 AM  Result Value Ref Range   Prealbumin 9.1 (L) 18 - 38 mg/dL  CMET QWeek     Status: Abnormal   Collection Time: 11/07/19  3:12 AM  Result Value Ref Range   Sodium 135 135 - 145 mmol/L   Potassium 4.2 3.5 - 5.1 mmol/L   Chloride 100 98 - 111 mmol/L   CO2 28 22 - 32 mmol/L   Glucose, Bld 115 (H) 70 - 99 mg/dL   BUN 18 8 - 23 mg/dL   Creatinine, Ser 0.71 0.44 - 1.00 mg/dL   Calcium 7.8 (L) 8.9 - 10.3 mg/dL   Total Protein 4.8 (L) 6.5 - 8.1 g/dL   Albumin 1.4 (L) 3.5 -  5.0 g/dL   AST 11 (L) 15 - 41 U/L   ALT 10 0 - 44 U/L   Alkaline Phosphatase 114 38 - 126 U/L   Total Bilirubin 0.2 (L) 0.3 - 1.2 mg/dL   GFR calc non Af Amer >60 >60 mL/min   GFR calc Af Amer >60 >60 mL/min   Anion gap 7 5 - 15  CBC with Differential     Status: Abnormal   Collection Time: 11/07/19  3:12 AM  Result Value Ref Range   WBC 10.5 4.0 - 10.5 K/uL   RBC 2.65 (L) 3.87 - 5.11 MIL/uL   Hemoglobin 8.1 (L) 12.0 - 15.0 g/dL   HCT 25.1 (L) 36.0 - 46.0 %   MCV 94.7 80.0 - 100.0 fL   MCH 30.6 26.0 - 34.0 pg   MCHC 32.3 30.0 - 36.0 g/dL   RDW 14.9 11.5 - 15.5 %   Platelets 453 (H) 150 - 400 K/uL   nRBC 0.0 0.0 - 0.2 %   Neutrophils Relative % 81 %   Neutro Abs 8.5 (H) 1.7 - 7.7 K/uL   Lymphocytes Relative 8 %   Lymphs  Abs 0.9 0.7 - 4.0 K/uL   Monocytes Relative 11 %   Monocytes Absolute 1.1 (H) 0.1 - 1.0 K/uL   Eosinophils Relative 0 %   Eosinophils Absolute 0.0 0.0 - 0.5 K/uL   Basophils Relative 0 %   Basophils Absolute 0.0 0.0 - 0.1 K/uL   Immature Granulocytes 0 %   Abs Immature Granulocytes 0.04 0.00 - 0.07 K/uL  Magnesium QWeek     Status: None   Collection Time: 11/07/19  3:12 AM  Result Value Ref Range   Magnesium 2.1 1.7 - 2.4 mg/dL  Phosphorus QWeek     Status: None   Collection Time: 11/07/19  3:12 AM  Result Value Ref Range   Phosphorus 3.4 2.5 - 4.6 mg/dL  Glucose, capillary     Status: Abnormal   Collection Time: 11/07/19  4:04 AM  Result Value Ref Range   Glucose-Capillary 102 (H) 70 - 99 mg/dL  Glucose, capillary     Status: None   Collection Time: 11/07/19  7:35 AM  Result Value Ref Range   Glucose-Capillary 99 70 - 99 mg/dL  Glucose, capillary     Status: Abnormal   Collection Time: 11/07/19 12:11 PM  Result Value Ref Range   Glucose-Capillary 113 (H) 70 - 99 mg/dL    Assessment & Plan: Present on Admission:  . Adenocarcinoma of head of pancreas ([place]) H/o sepsis Protein calorie malnutrition BRCA 2 H/o breast cancer Gastric outlet  obstruction   LOS: 41 days   Additional comments:I reviewed the patient's new clinical lab test results.    1F s/p Whipple  Pancreatic cancer - s/p Whipple by Dr. Barry Dienes 11/3.  ypT2N1 ABL anemia - stable Presumed delayed gastric emptying vs obstruction due to swelling at [place]-  Open G tube 12/8- G tube to gravity for now and J tube for feeding.   FEN -allow small amount of palliative clears, but G tube to gravity, so no nutritional value.  Tube feeds to goal of 50 Endo - hypoglycemia resolved.     DVT - LMWH, PT/OT Severe protein calorie malnutrition.  Albumin and prealbumin back down this week.    Dispo- will look for SNF placement at this point now that G tube is in place.  Will need full feeds via J tube.  Now has ascites/anasarca leaking from wound.    Milus Height, MD FACS Surgical Oncology, General Surgery, Trauma and [place] [facility], [place] for weekday/non holidays Check [email or web address] for coverage night/weekend/holidays  Do not use SecureChat as we are not always in the epic system to get the message.  Also, we may be off.  It is not reliable for patient care.

## 2019-11-08 ENCOUNTER — Inpatient Hospital Stay (HOSPITAL_COMMUNITY): Payer: Medicare HMO

## 2019-11-08 LAB — GLUCOSE, CAPILLARY
Glucose-Capillary: 100 mg/dL — ABNORMAL HIGH (ref 70–99)
Glucose-Capillary: 109 mg/dL — ABNORMAL HIGH (ref 70–99)
Glucose-Capillary: 111 mg/dL — ABNORMAL HIGH (ref 70–99)
Glucose-Capillary: 118 mg/dL — ABNORMAL HIGH (ref 70–99)
Glucose-Capillary: 131 mg/dL — ABNORMAL HIGH (ref 70–99)
Glucose-Capillary: 138 mg/dL — ABNORMAL HIGH (ref 70–99)

## 2019-11-08 MED ORDER — OSMOLITE 1.5 CAL PO LIQD
1000.0000 mL | ORAL | Status: DC
  Administered 2019-11-09: 1000 mL
  Filled 2019-11-08 (×3): qty 1000

## 2019-11-08 MED ORDER — ALBUMIN HUMAN 25 % IV SOLN
25.0000 g | Freq: Four times a day (QID) | INTRAVENOUS | Status: DC
  Administered 2019-11-08 – 2019-11-09 (×7): 25 g via INTRAVENOUS
  Filled 2019-11-08 (×8): qty 100

## 2019-11-08 MED ORDER — IOHEXOL 300 MG/ML  SOLN
150.0000 mL | Freq: Once | INTRAMUSCULAR | Status: AC | PRN
  Administered 2019-11-08: 60 mL

## 2019-11-08 NOTE — Progress Notes (Signed)
Nutrition Follow-up  DOCUMENTATION CODES:   Non-severe (moderate) malnutrition in context of chronic illness  INTERVENTION:   Inicrease Osmolite 1.5 to 60 ml/hr via j-tube  30 ml Prostat daily.    Tube feeding regimen provides 2360 kcal (100% of needs), 120 grams of protein, and 1097 ml of H2O.   NUTRITION DIAGNOSIS:   Moderate Malnutrition related to chronic illness(pancreatic cancer s/p whipple) as evidenced by mild fat depletion, moderate fat depletion, mild muscle depletion, moderate muscle depletion.  Ongoing  GOAL:   Patient will meet greater than or equal to 90% of their needs  Progressing   MONITOR:   PO intake, Supplement acceptance, Diet advancement, Labs, Weight trends, Skin, I & O's  REASON FOR ASSESSMENT:   Consult New TPN/TNA  ASSESSMENT:   Pt with PMH of breast cancer s/p lumpectomy and XRT, hypothyroidism, HTN, dx with pancreatic cancer May 2020 s/p ERCP with stent, chemo (complicated by nausea, weight loss, duodenal obstruction requiring TPN) now admitted for pancreaticoduodenectomy with cholecystectomy with removal of 4 cm from pancreatic head.  Procedure(11/3): WHIPPLE PROCEDURE (Abdomen) GASTROJEJUNOSTOMY, J TUBE (Abdomen) for adenocarcinoma of the pancreas, s/p neoadjuvant chemo and neoadjuvant SBRT 11/17- NGT inserted 11/19- TF restarted 11/27- s/p upper GI endoscopy- revealed esophagitis (biopsied) and no window for PEG placement 11/28-switched to Vital formula due to diarrhea 12/5- TF formula switched back to Osmolite 1.5 per general surgery 12/8- NGT removed, s/p Procedure(s): Open gastrostomy tube, change of jejunostomy tube, tube studies with fluoroscopy 12/13- wound vac d/c due to increased serosanguineous drainage 12/14- eakin pouch applied to abdominal wound  Reviewed I/O's: -1.4 L x 24 hours and -5.9 L since 10/25/19  UOP: 700 ml x 24 hours   Drain output: 700 ml x 24 hours  Spoke with pt at bedside, who complains of nausea  (RN about to give zofran). She complains of being "so weak that I can't lift my arm up", however, observed her lifting both lower arms. Pt shares that she has been refusing physical therapy due to weakness, stating "I just don't think I can do it today". RD encouraged participating in therapies as part of treatment plan.   Pt denies any abdominal pain and vomiting. Shares "I always have nausea, no matter what I do". She is happy to have open g-tube (open to gravity) and is tolerating ice chips and sips of water "to keep my mouth wet- it's always dry". Diarrhea has improved; pt with no BMS today but had a solid BM yesterday per her report. TF of Osmolite 1.5 infusing @ 50 ml/hr with 30 ml Prostat BID. Regimen providing 95% of estimated kcal needs and 91% of estimated protein needs.   Case discussed extensively with RN and PharmD. Pt continues to tolerate TF via j-tube. RN reports that eakin pouch is containing drainage of abdominal wound well. Pt continues to have functioning GI tract and tolerating TF via j-tube, so would recommend continuing nutrition via enteral route. RD will re-evaluate TF regimen to help maximize nutritional status given leaking abdominal wound. Discussed recommendations of continuing enteral nutrition with both RN and PharmD.   Albumin has a half-life of 21 days and is strongly affected by stress response and inflammatory process, therefore, do not expect to see an improvement in this lab value during acute hospitalization. When a patient presents with low albumin, it is likely skewed due to the acute inflammatory response. Note that low albumin is no longer used to diagnose malnutrition; [place] uses the new malnutrition guidelines published by  the American Society for Parenteral and Enteral Nutrition (A.S.P.E.N.) and the Academy of Nutrition and Dietetics (AND).    Labs reviewed: CBGS: 111-138.  Diet Order:   Diet Order            Diet NPO time specified Except for: Sips  with Meds, Ice Chips, Other (See Comments)  Diet effective midnight              EDUCATION NEEDS:   Education needs have been addressed  Skin:  Skin Assessment: Skin Integrity Issues: Skin Integrity Issues:: Wound VAC Wound Vac: abdomen Incisions: abdomen  Last BM:  11/07/19  Height:   Ht Readings from Last 1 Encounters:  10/13/2019 5\' 4"  (1.626 m)    Weight:   Wt Readings from Last 1 Encounters:  11/06/19 70.1 kg    Ideal Body Weight:  54.5 kg  BMI:  Body mass index is 26.53 kg/m.  Estimated Nutritional Needs:   Kcal:  2100-2300  Protein:  115-130 grams  Fluid:  > 2.1 L    Rachell Druckenmiller A. Jimmye Norman, RD, LDN, [place] Registered Dietitian II Certified Diabetes Care and Education Specialist Pager: [phone] After hours Pager: [phone]

## 2019-11-08 NOTE — Progress Notes (Signed)
PT Cancellation Note  Patient Details Name: NONYA PAHL MRN: [id number] DOB: 1946/04/26   Cancelled Treatment:    Reason Eval/Treat Not Completed: Patient declined, no reason specified. Pt reports that she is having some general pain as well as general malaise today following her CT scan. The pt reports she only wants ice chips and cannot tolerate mobility at this time. The pt was given ice chips, and PT will continue to follow as time/schedule allow.   Karma Ganja, PT, DPT   Acute Rehabilitation Department [phone]   Otho Bellows 11/08/2019, 3:56 PM

## 2019-11-08 NOTE — Progress Notes (Signed)
Subjective:    Still feels quite weak.    Objective:  Vital signs for last 24 hours: Temp:  [97.8 F (36.6 C)-98.4 F (36.9 C)] 97.8 F (36.6 C) (12/15 0411) Pulse Rate:  [103-115] 112 (12/15 0411) Resp:  [15-18] 15 (12/15 0411) BP: (124-129)/(60-66) 128/64 (12/15 0411) SpO2:  [95 %-100 %] 95 % (12/15 0411)  Intake/Output from previous day: 12/14 0701 - 12/15 0700 In: 0  Out: 1400 [Urine:700; Drains:700]  Intake/Output this shift: No intake/output data recorded.  Vent settings for last 24 hours:    Physical Exam:  Gen: awake, looks uncomfortable.   Neuro: no acute issues.   Pulm: unlabored breathing Abd: soft, approp tender at incision. eakin's pouch with serous output.  Tube feeds running.  G tube to gravity with bilious output.   Abd sl distended today. Extr: wwp   Results for orders placed or performed during the hospital encounter of 09/26/2019 (from the past 24 hour(s))  Glucose, capillary     Status: Abnormal   Collection Time: 11/07/19 12:11 PM  Result Value Ref Range   Glucose-Capillary 113 (H) 70 - 99 mg/dL  Glucose, capillary     Status: Abnormal   Collection Time: 11/07/19  4:08 PM  Result Value Ref Range   Glucose-Capillary 112 (H) 70 - 99 mg/dL  Glucose, capillary     Status: Abnormal   Collection Time: 11/07/19  7:59 PM  Result Value Ref Range   Glucose-Capillary 100 (H) 70 - 99 mg/dL  Glucose, capillary     Status: Abnormal   Collection Time: 11/08/19 12:03 AM  Result Value Ref Range   Glucose-Capillary 100 (H) 70 - 99 mg/dL  Glucose, capillary     Status: Abnormal   Collection Time: 11/08/19  4:06 AM  Result Value Ref Range   Glucose-Capillary 118 (H) 70 - 99 mg/dL  Glucose, capillary     Status: Abnormal   Collection Time: 11/08/19  7:58 AM  Result Value Ref Range   Glucose-Capillary 131 (H) 70 - 99 mg/dL    Assessment & Plan: Present on Admission:  . Adenocarcinoma of head of pancreas ([place]) H/o sepsis Protein calorie  malnutrition BRCA 2 H/o breast cancer Gastric outlet obstruction   LOS: 42 days   Additional comments:I reviewed the patient's new clinical lab test results.    40F s/p Whipple  Pancreatic cancer - s/p Whipple by Dr. Barry Dienes 11/3.  ypT2N1 ABL anemia - stable Presumed delayed gastric emptying vs obstruction due to swelling at [place]-  Open G tube 12/8- G tube to gravity for now and J tube for feeding.  Tried upper GI which showed significant esophageal reflux, but did not opacify the stomach fully or the gastrojejunostomy FEN -allow small amount of palliative clears, but G tube to gravity, so no nutritional value.  Tube feeds to goal of 50 Endo - hypoglycemia resolved.     DVT - LMWH, PT/OT Severe protein calorie malnutrition.  Albumin and prealbumin back down this week.   Try adding TPN for 1-2 weeks.    Dispo- will look for SNF placement at this point now that G tube is in place.  Will need full feeds via J tube.  Now has ascites/anasarca leaking from wound.    Milus Height, MD FACS Surgical Oncology, General Surgery, Trauma and [place] [facility], [place] for weekday/non holidays Check [email or web address] for coverage night/weekend/holidays  Do not use SecureChat as we are not always in the epic system to  get the message.  Also, we may be off.  It is not reliable for patient care.

## 2019-11-08 NOTE — Progress Notes (Signed)
Palliative:  I went to visit with Julie Rollins but she is not in room. I will follow up on symptoms and GOC tomorrow.   No charge  Vinie Sill, NP Palliative Medicine Team Pager [phone] (Please see [email or web address] for schedule) Team Phone [phone]

## 2019-11-08 NOTE — TOC Progression Note (Signed)
Transition of Care [facility]) - Progression Note    Patient Details  Name: Julie Rollins MRN: [id number] Date of Birth: [birth year]  Transition of Care [facility]) CM/SW [place], [place] Phone Number: 11/08/2019, 10:33 AM  Clinical Narrative:    [facility] team continues to follow, pt remains not medically stable for SNF placement at this time. Aware of ongoing [place], we remain available as needed and appropriate.    Expected Discharge Plan: Skilled Nursing Facility Barriers to Discharge: Ship broker, Continued Medical Work up  Expected Discharge Plan and Services Expected Discharge Plan: [place] In-house Referral: Clinical Social Work Discharge Planning Services: CM Consult Post Acute Care Choice: [place] arrangements for the past 2 months: Single Family Home  Readmission Risk Interventions Readmission Risk Prevention Plan 09/30/2019 09/13/2019  Transportation Screening Complete Complete  Medication Review Press photographer) Referral to Pharmacy Referral to Pharmacy  PCP or Specialist appointment within 3-5 days of discharge Complete Not Complete  PCP/Specialist Appt Not Complete comments - pt established at [facility] and with PCP but DC date unknown  HRI or Home Care Consult Complete Complete  SW Recovery Care/Counseling Consult Complete Complete  Palliative Care Screening Not Complete Not Complete  Comments - pt in procedure did not discuss if any past palliative care involvement, however none in current encounter  [place] Complete Not Applicable  Some recent data might be hidden

## 2019-11-08 NOTE — Plan of Care (Signed)
  Problem: Coping: Goal: Level of anxiety will decrease Outcome: Progressing   Problem: Pain Managment: Goal: General experience of comfort will improve Outcome: Progressing   

## 2019-11-09 DIAGNOSIS — Z7189 Other specified counseling: Secondary | ICD-10-CM

## 2019-11-09 LAB — GLUCOSE, CAPILLARY
Glucose-Capillary: 101 mg/dL — ABNORMAL HIGH (ref 70–99)
Glucose-Capillary: 106 mg/dL — ABNORMAL HIGH (ref 70–99)
Glucose-Capillary: 117 mg/dL — ABNORMAL HIGH (ref 70–99)
Glucose-Capillary: 130 mg/dL — ABNORMAL HIGH (ref 70–99)
Glucose-Capillary: 137 mg/dL — ABNORMAL HIGH (ref 70–99)
Glucose-Capillary: 139 mg/dL — ABNORMAL HIGH (ref 70–99)
Glucose-Capillary: 68 mg/dL — ABNORMAL LOW (ref 70–99)

## 2019-11-09 MED ORDER — SODIUM BICARBONATE 650 MG PO TABS
650.0000 mg | ORAL_TABLET | Freq: Once | ORAL | Status: DC
  Filled 2019-11-09: qty 1

## 2019-11-09 MED ORDER — HYDROMORPHONE HCL 1 MG/ML IJ SOLN
0.5000 mg | INTRAMUSCULAR | Status: DC | PRN
  Administered 2019-11-09 – 2019-11-10 (×5): 1 mg via INTRAVENOUS
  Filled 2019-11-09 (×5): qty 1

## 2019-11-09 MED ORDER — DEXTROSE 50 % IV SOLN
INTRAVENOUS | Status: AC
  Filled 2019-11-09: qty 50

## 2019-11-09 MED ORDER — PANCRELIPASE (LIP-PROT-AMYL) 12000-38000 UNITS PO CPEP
24000.0000 [IU] | ORAL_CAPSULE | Freq: Once | ORAL | Status: AC
  Administered 2019-11-09: 20:00:00 24000 [IU] via ORAL
  Filled 2019-11-09: qty 2

## 2019-11-09 MED ORDER — LORAZEPAM 2 MG/ML IJ SOLN
0.5000 mg | Freq: Four times a day (QID) | INTRAMUSCULAR | Status: DC | PRN
  Administered 2019-11-09 (×3): 0.5 mg via INTRAVENOUS
  Filled 2019-11-09 (×3): qty 1

## 2019-11-09 MED ORDER — NALOXONE HCL 0.4 MG/ML IJ SOLN
INTRAMUSCULAR | Status: AC
  Filled 2019-11-09: qty 1

## 2019-11-09 MED ORDER — OXYCODONE HCL 5 MG PO TABS: 15.0000 mg | ORAL_TABLET | ORAL | Status: DC | PRN

## 2019-11-09 MED ORDER — LORAZEPAM 2 MG/ML IJ SOLN: 0.5000 mg | Freq: Four times a day (QID) | INTRAMUSCULAR | Status: DC

## 2019-11-09 MED ORDER — DEXTROSE 50 % IV SOLN
INTRAVENOUS | Status: AC
  Administered 2019-11-09: 25 mL
  Filled 2019-11-09: qty 50

## 2019-11-09 MED ORDER — PANCRELIPASE (LIP-PROT-AMYL) 12000-38000 UNITS PO CPEP: 24000.0000 [IU] | ORAL_CAPSULE | Freq: Once | ORAL | Status: DC

## 2019-11-09 MED ORDER — SODIUM BICARBONATE 650 MG PO TABS
650.0000 mg | ORAL_TABLET | Freq: Once | ORAL | Status: AC
  Administered 2019-11-09: 20:00:00 650 mg via ORAL
  Filled 2019-11-09: qty 1

## 2019-11-09 NOTE — Progress Notes (Signed)
Palliative:  HPI: 73 yo female with PMH breast cancer, hypothyroidism, anxiety, pancreatic cancer admitted 10/03/2019 for whipple procedure. S/P whipple procedure with clean margins, residual tumor, and 2 positive lymph nodes. Hospitalization has been complicated by delayed gastric emptying and intolerance of po intake. GJ tube placed for drainage and nutrition. With ongoing pain, nausea and weakness/fatigue. 12/16 J tube now clogged and she is considering comfort measures.     I met today with Julie Rollins. Julie Rollins is distressed, tearful, and telling bedside RN and myself "I want to die." When I discussed further with her Julie Rollins and expectations she is not as quick to agree that she wants full comfort measures.   I spent some time explaining to Julie Rollins that she is weak with ongoing nausea and pain and, unfortunately, there are no interventions planned that will resolve these issues. I did explain that I will be happy to give her more pain medication and anxiety medication IF her goal is to be comfortable. However, if she wishes to continued to try and improve this could mean continuing symptom management balanced with trying to keep her awake to push through working with PT/OT and will take a lot of work from her. I explained that we all understand if she has met her physical limit and if she wishes to focus on comfort care this would be acceptable and we will support and respect her wishes. She would like for me to call her friend, Julie Rollins, as well as Julie Rollins. She does confirm that she would NOT desire CPR/intubation or transfer to ICU for life support and opts for DNR. This is aligned with her wishes in Living Will.   I did call and speak with Julie Rollins who is supportive of Julie Rollins's wishes if she decides to focus on comfort care. She has had so many obstacles and barriers and is not participating with therapy last few days. She has constant nausea/pain and not tolerating tube feeding. J tube currently clogged and would  likely need exchange if she wishes to continue with aggressive care.   I also spoke with friend, Julie Rollins. I explained above conversation to [facility] as well as discussion with Julie Rollins. Discussed in detail what is needed in order to continue with aggressive care. After long discussion Julie Rollins plans to meet with myself and [place] tomorrow Nov 30, 2019 ~11-11:30 am.   I attempted to revisit with Julie Rollins but she has just received increased dose of dilaudid and is too lethargic to continue conversation or make decisions. RN reports that she has continued to talk about dying and wanting to be comfortable throughout the day. Will revisit GOC and decisions tomorrow.   Exam: Alert, thin, frail. Pale. Grimacing. Weakness. Tachycardic. Breathing regular, unlabored. Abd with G and J tube and eakin's pouch appears more distended.   Plan: - DNR decided.  - Considering transition to comfort care.  - J tube clogged - will see what her wishes are for exchange vs transition to comfort care.  - She would be eligible for hospice facility if she opts for comfort care that could assist with symptom management.   [place], NP Palliative Medicine Team Pager [phone] (Please see [email or web address] for schedule) Team Phone [phone]    Greater than 50%  of this time was spent counseling and coordinating care related to the above assessment and plan

## 2019-11-09 NOTE — Progress Notes (Signed)
Nutrition Follow-up  DOCUMENTATION CODES:   Non-severe (moderate) malnutrition in context of chronic illness  INTERVENTION:   -Once feeding access is established, resume:  Osmolite 1.5 to 60 ml/hr via j-tube  30 ml Prostat daily.    Tube feeding regimen provides 2360 kcal (100% of needs), 120 grams of protein, and 1097 ml of H2O.   NUTRITION DIAGNOSIS:   Moderate Malnutrition related to chronic illness(pancreatic cancer s/p whipple) as evidenced by mild fat depletion, moderate fat depletion, mild muscle depletion, moderate muscle depletion.  Ongoing  GOAL:   Patient will meet greater than or equal to 90% of their needs  Unmet  MONITOR:   PO intake, Supplement acceptance, Diet advancement, Labs, Weight trends, Skin, I & O's  REASON FOR ASSESSMENT:   Consult New TPN/TNA  ASSESSMENT:   Pt with PMH of breast cancer s/p lumpectomy and XRT, hypothyroidism, HTN, dx with pancreatic cancer May 2020 s/p ERCP with stent, chemo (complicated by nausea, weight loss, duodenal obstruction requiring TPN) now admitted for pancreaticoduodenectomy with cholecystectomy with removal of 4 cm from pancreatic head.  Procedure(11/3): WHIPPLE PROCEDURE (Abdomen) GASTROJEJUNOSTOMY, J TUBE (Abdomen) for adenocarcinoma of the pancreas, s/p neoadjuvant chemo and neoadjuvant SBRT 11/17- NGT inserted 11/19- TF restarted 11/27- s/p upper GI endoscopy- revealed esophagitis (biopsied) and no window for PEG placement 11/28-switched to Vital formula due to diarrhea 12/5- TF formula switched back to Osmolite 1.5 per general surgery 12/8- NGT removed, s/pProcedure(s): Open gastrostomy tube, change of jejunostomy tube, tube studies with fluoroscopy 12/13- wound vac d/c due to increased serosanguineous drainage 12/14- eakin pouch applied to abdominal wound  Reviewed I/O's: -3.4 L x 24 hours and -11 L since 10/26/19  UOP: 1.8 L x 24 hours  Drain output: 850 ml x 24 hours  Pt not very talkative  today. Per discussion with RN, TF currently on hold due to clogged j-tube.   Palliative care also following pt for pain control and goals of care discussions.   Labs reviewed: CBGS: 101-117  Diet Order:   Diet Order            Diet NPO time specified Except for: Sips with Meds, Ice Chips, Other (See Comments)  Diet effective midnight              EDUCATION NEEDS:   Education needs have been addressed  Skin:  Skin Assessment: Skin Integrity Issues: Skin Integrity Issues:: Wound VAC Wound Vac: abdomen Incisions: abdomen  Last BM:  11/07/19  Height:   Ht Readings from Last 1 Encounters:  10/17/2019 5\' 4"  (1.626 m)    Weight:   Wt Readings from Last 1 Encounters:  11/06/19 70.1 kg    Ideal Body Weight:  54.5 kg  BMI:  Body mass index is 26.53 kg/m.  Estimated Nutritional Needs:   Kcal:  2100-2300  Protein:  115-130 grams  Fluid:  > 2.1 L    Daishaun Ayre A. Jimmye Norman, RD, LDN, [place] Registered Dietitian II Certified Diabetes Care and Education Specialist Pager: [phone] After hours Pager: [phone]

## 2019-11-09 NOTE — Progress Notes (Signed)
Hypoglycemic Event  CBG: 68  Treatment: D50 25 mL (12.5 gm)  Symptoms: None  Follow-up CBG: Time:1700 CBG Result:139  Possible Reasons for Event: Inadequate meal intake and Other: Pt's tube feeding stopped due to clogged J tube  Comments/MD notified: Repeat blood sugar improved.     Quin Hoop

## 2019-11-09 NOTE — Progress Notes (Signed)
Subjective:    Feels like she can't stay asleep and wakes up at night often.    Objective:  Vital signs for last 24 hours: Temp:  [97.4 F (36.3 C)-97.7 F (36.5 C)] 97.4 F (36.3 C) (12/16 0357) Pulse Rate:  [102-114] 114 (12/16 0357) Resp:  [14-16] 16 (12/16 0357) BP: (122-143)/(56-74) 143/74 (12/16 0357) SpO2:  [97 %-100 %] 98 % (12/16 0357)  Intake/Output from previous day: 12/15 0701 - 12/16 0700 In: 200 [IV Piggyback:200] Out: 3600 [Urine:1800; Drains:850]  Intake/Output this shift: No intake/output data recorded.  Vent settings for last 24 hours:    Physical Exam:  Gen: sleeping, arousable.  Looks uncomfortable Neuro: no acute issues.   Pulm: unlabored breathing Abd: soft, approp tender at incision. eakin's pouch with serous output.  Tube feeds running.  G tube to gravity with bilious output.   Abd sl distended again today.   Extr: wwp   Results for orders placed or performed during the hospital encounter of 09/30/2019 (from the past 24 hour(s))  Glucose, capillary     Status: Abnormal   Collection Time: 11/08/19  4:53 PM  Result Value Ref Range   Glucose-Capillary 111 (H) 70 - 99 mg/dL  Glucose, capillary     Status: Abnormal   Collection Time: 11/08/19  8:07 PM  Result Value Ref Range   Glucose-Capillary 109 (H) 70 - 99 mg/dL  Glucose, capillary     Status: Abnormal   Collection Time: 11/09/19 12:22 AM  Result Value Ref Range   Glucose-Capillary 117 (H) 70 - 99 mg/dL  Glucose, capillary     Status: Abnormal   Collection Time: 11/09/19  3:52 AM  Result Value Ref Range   Glucose-Capillary 101 (H) 70 - 99 mg/dL  Glucose, capillary     Status: Abnormal   Collection Time: 11/09/19  8:04 AM  Result Value Ref Range   Glucose-Capillary 137 (H) 70 - 99 mg/dL  Glucose, capillary     Status: Abnormal   Collection Time: 11/09/19 12:01 PM  Result Value Ref Range   Glucose-Capillary 130 (H) 70 - 99 mg/dL    Assessment & Plan: Present on Admission:  .  Adenocarcinoma of head of pancreas ([place]) H/o sepsis Protein calorie malnutrition BRCA 2 H/o breast cancer Gastric outlet obstruction   LOS: 43 days   Additional comments:I reviewed the patient's new clinical lab test results.    28F s/p Whipple  Pancreatic cancer - s/p Whipple by Dr. Barry Dienes 11/3.  ypT2N1 ABL anemia - stable Presumed delayed gastric emptying vs obstruction due to swelling at [place]-  Open G tube 12/8- G tube to gravity for now and J tube for feeding.  Tried upper GI which showed significant esophageal reflux, but did not opacify the stomach fully or the gastrojejunostomy.  Not gagging again.   FEN -Small amount of palliative clears, but G tube to gravity, so no nutritional value.  Tube feeds to goal of 60 (goal increased since she hasn't bumped this week). Endo - hypoglycemia resolved.     DVT - LMWH, PT/OT Severe protein calorie malnutrition.  Albumin and prealbumin back down this week.     Dispo- have been looking for SNF placement at this point now that G tube is in place.  Will need full feeds via J tube.  Now has ascites/anasarca leaking from wound.    Pt has been talking to palliative care.  She is now DNR.  Discussing moving to comfort care.    Dorris Fetch  Minna Merritts, MD FACS Surgical Oncology, General Surgery, Trauma and [place] Surgery, [place] for weekday/non holidays Check [email or web address] for coverage night/weekend/holidays  Do not use SecureChat as we are not always in the epic system to get the message.  Also, we may be off.  It is not reliable for patient care.

## 2019-11-09 NOTE — Care Management Important Message (Signed)
Important Message  Patient Details  Name: Julie Rollins MRN: [id number] Date of Birth: 04/11/1946   Medicare Important Message Given:  Yes     Memory Argue 11/09/2019, 2:03 PM

## 2019-11-09 NOTE — Progress Notes (Signed)
J tube clogged. Attempted to unclog Jtube at bedside, unsuccessful. Paged Dr. Barry Dienes, she is aware tube feedings have been stopped at this time.

## 2019-11-09 NOTE — TOC Progression Note (Signed)
Transition of Care [facility]) - Progression Note    Patient Details  Name: Julie Rollins MRN: [id number] Date of Birth: 12/11/1945  Transition of Care [facility]) CM/SW [place], [place] Phone Number: 11/09/2019, 11:53 AM  Clinical Narrative:    CSW aware pt now DNR, ongoing [place] discussions.  Pt has offers for SNF at this time. Will revisit to discuss with her.  Pt will need to work with PT (she has refused twice) in order for [place] to submit for insurance approval. MD aware that Lynann Beaver pouch may present a placement barrier and has alternate plan if that is the case.    Expected Discharge Plan: Skilled Nursing Facility Barriers to Discharge: Ship broker, Continued Medical Work up  Expected Discharge Plan and Services Expected Discharge Plan: [place] In-house Referral: Clinical Social Work Discharge Planning Services: CM Consult Post Acute Care Choice: [place] arrangements for the past 2 months: Single Family Home  Readmission Risk Interventions Readmission Risk Prevention Plan 09/30/2019 09/13/2019  Transportation Screening Complete Complete  Medication Review Press photographer) Referral to Pharmacy Referral to Pharmacy  PCP or Specialist appointment within 3-5 days of discharge Complete Not Complete  PCP/Specialist Appt Not Complete comments - pt established at [facility] and with PCP but DC date unknown  HRI or Home Care Consult Complete Complete  SW Recovery Care/Counseling Consult Complete Complete  Palliative Care Screening Not Complete Not Complete  Comments - pt in procedure did not discuss if any past palliative care involvement, however none in current encounter  [place] Complete Not Applicable  Some recent data might be hidden

## 2019-11-09 NOTE — Progress Notes (Signed)
Night shift RN will attempt to unclogg J tube with Creon and Sodium bicarb.

## 2019-11-09 NOTE — Progress Notes (Addendum)
Patient c/o significant pain throughout the night despite PRN dilaudid and oxycodone. Patient was given dilaudid q2H per order but requested pain medicine q1H. She was also given her oxycodone IR 10mg  per order throughout the night. See MAR. Kae Heller, MD paged overnight. Pain medications to stay the same until primary team saw her today. Ativan was increased in frequency from q8H to q6H. No medications have been effective during this shift. Will pass along to dayshift RN to discuss with primary team.    Patient rude and belligerent to staff throughout the shift when educating regarding order instructions and frequency of medications. Patient educated on concern for respiratory depression due to frequency of narcotics being given. She was unable to accept this information and continued to demand pain medication throughout the shift.

## 2019-11-10 LAB — GLUCOSE, CAPILLARY: Glucose-Capillary: 69 mg/dL — ABNORMAL LOW (ref 70–99)

## 2019-11-10 MED ORDER — DEXTROSE 50 % IV SOLN: 50.0000 mL | Freq: Once | INTRAVENOUS | Status: DC

## 2019-11-10 MED ORDER — SODIUM CHLORIDE 0.9 % IV BOLUS
500.0000 mL | Freq: Once | INTRAVENOUS | Status: AC
  Administered 2019-11-10: 01:00:00 500 mL via INTRAVENOUS

## 2019-11-10 MED ORDER — NALOXONE HCL 0.4 MG/ML IJ SOLN
0.4000 mg | INTRAMUSCULAR | Status: DC | PRN
  Administered 2019-11-10: 0.4 mg via INTRAVENOUS

## 2019-11-25 NOTE — Progress Notes (Signed)
Pt expired at [place]. Verified with Rutherford Nail RN. Notified Dr. Dema Severin.

## 2019-11-25 NOTE — Progress Notes (Signed)
Spoke with patients emergency contact and made aware of patient expiration. [name] states she'd like to collect her belongings.

## 2019-11-25 NOTE — Progress Notes (Signed)
I was notified by RN that she has been unresponsive and sleepy. When they went to check on her BP 50/20, HR 150s, unresponsive. I reviewed her chart from today, noting events from palliative care Julie Rollins -  Noting she confirmed with them: "she would NOT desire CPR/intubation or transfer to ICU for life support and opts for DNR. This is aligned with her wishes in Living Will." Currently, she is she minimally responsive.  I called her friend whom has been her point of contact by palliative as well - and confirmed these details. She has no living immediate family or family she has been in recent touch with - Julie Rollins has been her best friend for many years now and the person she has opted to discuss medical care/decisions with. We discussed her rather rapid deterioration. She had requested that we continue comfort measures but that [name] had told her she did not want anything aggressive done if she were to suddenly decline.  Overall, we discussed her expected course including possibility she were to pass in the next couple of hours  [name], M.D. [facility] Use [email or web address] to contact on call provider

## 2019-11-25 NOTE — Discharge Summary (Signed)
Physician Discharge Summary  Patient ID: Julie Rollins MRN: [id number] DOB/AGE: 1946/03/26 74 y.o.  Admit date: 09/28/2019 Discharge date: 11/12/2019  Admission Diagnoses: Adenocarcinoma of the head of pancreas, [id number] s/p neoadjuvant chemotherapy BRCA 2 mutation Moderate protein calorie malnutrition Partial gastric outlet obstruction On TPN Recent sepsis from infected port Weakness Hypothyroidism   Discharge Diagnoses:   Death- likely due to sepsis  Principal Problem:   Carcinoma of head of pancreas s/p Whipple pancreaticoduodenectomy 09/26/2019. 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   G tube in place J tube in place   Discharged Condition: deceased  Hospital Course:  Pt was admitted to the ICU following dx laparoscopy and pancreaticoduodenectomy with J tube 10/07/2019.  She was a Sales promotion account executive witness and had a reasonable blood loss intraoperatively.  She had refused blood.  She was placed on aranesp and critical care followed her for a bit to assist with that and with pneumonia.  On POD 2 when her HCT was 13 she consented to get blood.  She was given 4 units.  Because of her transfusion need and likelihood for becoming a bit coagulopathic, her epidural was removed on POD 1.  Early in her post op course, she also developed HCAP.  She was started on antibiotics.  Trickle tube feeds were started on POD 2.  She required BiPAP for a brief time as her pneumonia was worsening.  NGT was removed.  Clears were started on POD 5.  She developed significant abdominal distention, so tube feeds were held.  She was placed on full liquids.  One of her drains wasn't putting anything out so  was discontinued.  She was started on reglan for nausea.  She started having bowel movements.  Tube feeds were started and advanced to goal.  She started having significant episodes of "spitting up small amounts of bile.  She also started having dry heaves.  Over the next 24 hours, she had a large episode of bilious emesis and possible aspiration (nursing suspicion).  NGT was placed.  The output from the NGT went down a bit, but she started throwing up again with it clamped so it was placed back to suction.  Ct was performed and showed swelling at gastrojejunostomy. The stomach appeared to be just below abdominal wall, but IR did not feel comfortable placing perc G tube due to ascites.   Because of her gastric outlet obstruction, reglan was stopped.  She was placed on imodium for tube feed diarrhea. This continued, so she was switched to vital AF.   GI was consulted and performed EGD.  The efferent limb could not be accessed and there was still quite a bit of debris in the stomach.  There was no transillumination for safe G tube placement.  She developed redness at the center of her wound.  Pt was able to get to goal feeds with the vital formula.  Her prealbumin went up.  She was diuresed.    Repeat CT was performed to assess if any change.  There was less swelling and ascites seen, but patient continued to have n/v/retching if NGT was clamped.  Because of this, a surgical G tube was placed 11/08/2019.  This was very difficult due to adhesions.  The J tube site was not able to be seen.  Also, despite full feeds, her prealbumin plummeted.  She remained very depressed and weak.  She was only able to do a minimal amount with PT and refused it many times due to feeling "miserable."  Palliative care was consulted to see if we could help her symptoms at all.  Over the next week, she continued to make no progress and continued to feel worse.  She was maintained on full feeds and her goal was increased.  Due to her  continued lack of progress and continued pain/weakness, palliative care discussed no escalation of care and she was made DNR.  Over the next 12 hours, she had an incident where she suddenly dropped her BP to 50 systolic and her heart rate increased to 150.  She was evaluated by the surgeon on call.  A bolus of fluids was given without any improvement.  She was unresponsive at this time.  Dr. Dema Severin conversed with her good friend who was her person for assisted decision making.  Based on her DNR and no escalation of care, she was allowed to pass and was made comfortable.  She passed away at 1:21 AM 11-28-2023.    Consults: pulmonary/intensive care and palliative care  Significant Diagnostic Studies: labs: last labs prior to d/c showed Albumin of 1.4, HCT 25.1, normal electrolytes, prealbumin of 9.1.    Treatments: surgery: see above and antibiotics for pneumonia, NGT for gastric outlet obstruction, tube feeds  Discharge Exam: Blood pressure (!) 78/34, pulse (!) 48, temperature (!) 102 F (38.9 C), temperature source Axillary, resp. rate (!) 30, height '5\' 4"'$  (1.626 m), weight 70.1 kg, SpO2 (!) 65 %. unresponsive  Pale abd distended   Disposition:  [facility].     Signed: Stark Klein 11/12/2019, 12:36 PM

## 2019-11-25 NOTE — Progress Notes (Signed)
Bedside RN called CN to reassess patient. Pt is unresponsive. VS as follows: T= 102, P=153, BP= 65/23, O2=80 on RA. Pt placed on non-rebreather 15L, called RR nurse and Dr. Dema Severin. CBG was 69, gave 1 amp of D50. Narcan 0.4mg  x1. Pt remains barely responsive.

## 2019-11-25 DEATH — deceased

## 2020-01-05 ENCOUNTER — Other Ambulatory Visit: Payer: Medicare HMO

## 2020-01-05 ENCOUNTER — Ambulatory Visit: Payer: Medicare HMO | Admitting: Hematology

## 2020-06-24 IMAGING — DX PORTABLE CHEST - 1 VIEW
1 series · 1 of 1 positions shown · non-contrast
Comparison: 04/12/2019

CLINICAL DATA: Fever

EXAM:
PORTABLE CHEST 1 VIEW

[chest ap]
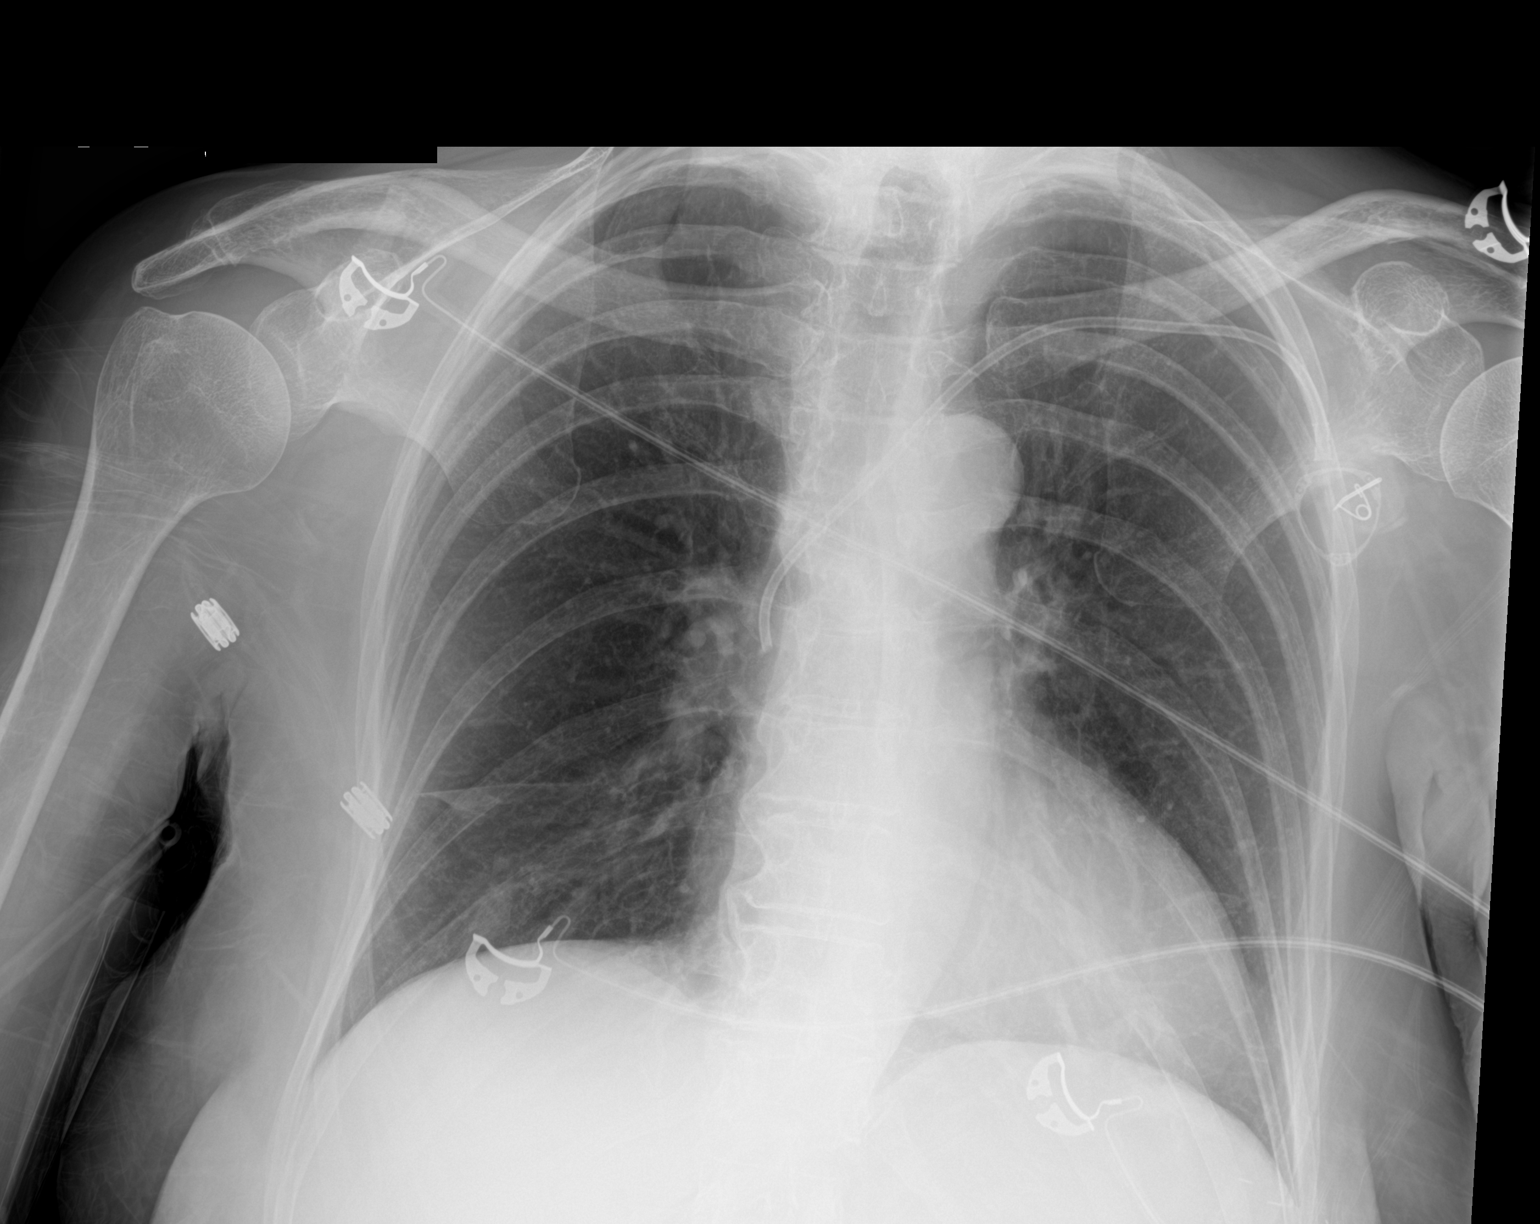

[1 of 1 positions shown; findings below may reference images not displayed]

FINDINGS: Left-sided central venous port tip over the SVC. No pleural
effusion. Stable cardiomediastinal silhouette. Small foci of
airspace disease in the right base. No pneumothorax
IMPRESSION: 1. Small patchy foci of airspace disease at the right base are
questionable for foci of infection or inflammation
2. The lung fields are otherwise clear

## 2020-06-27 IMAGING — DX CHEST - 2 VIEW
2 series · 2 of 2 positions shown · non-contrast
Comparison: 07/14/2019

CLINICAL DATA: Weakness, shortness of breath

EXAM:
CHEST - 2 VIEW

[chest pa]
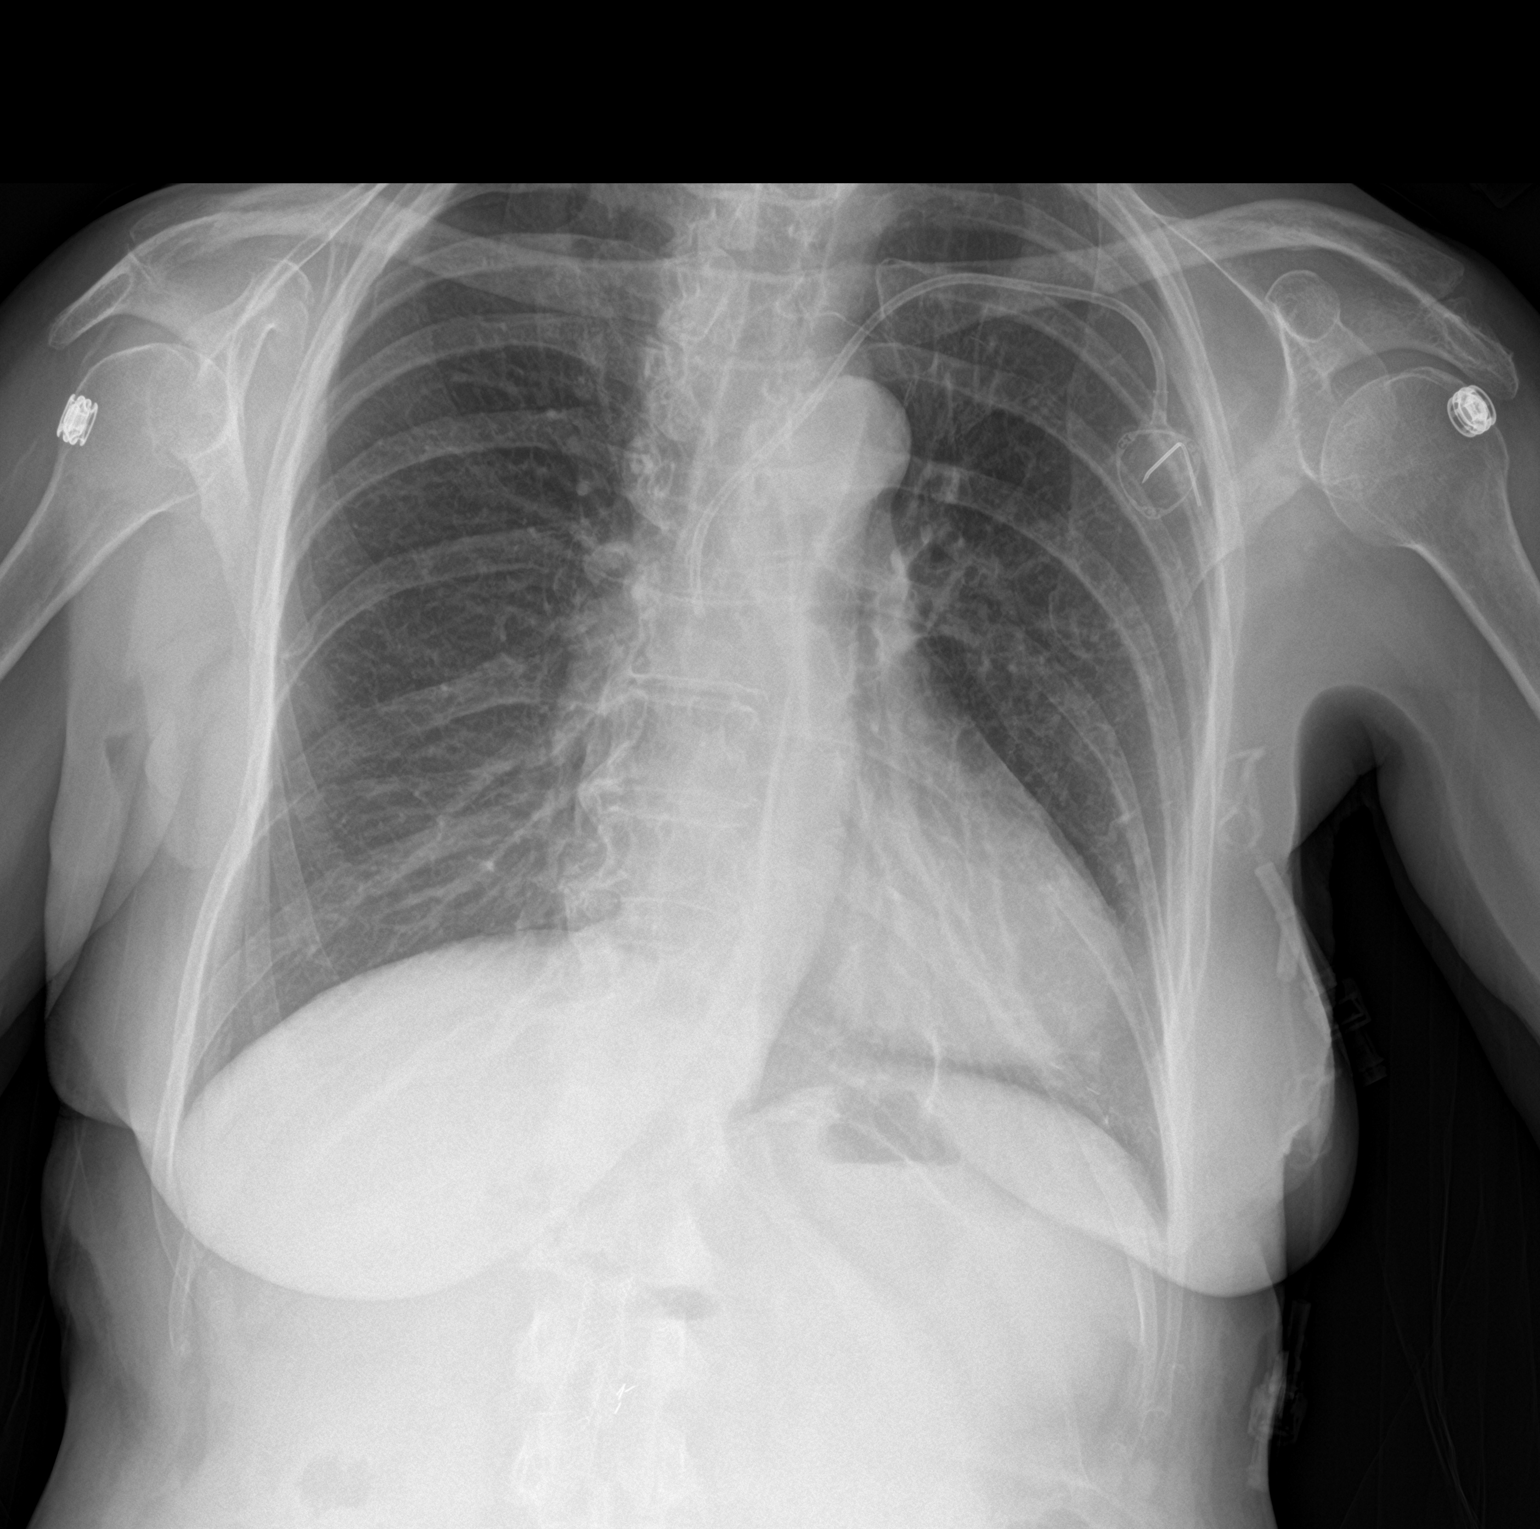

[chest lat]
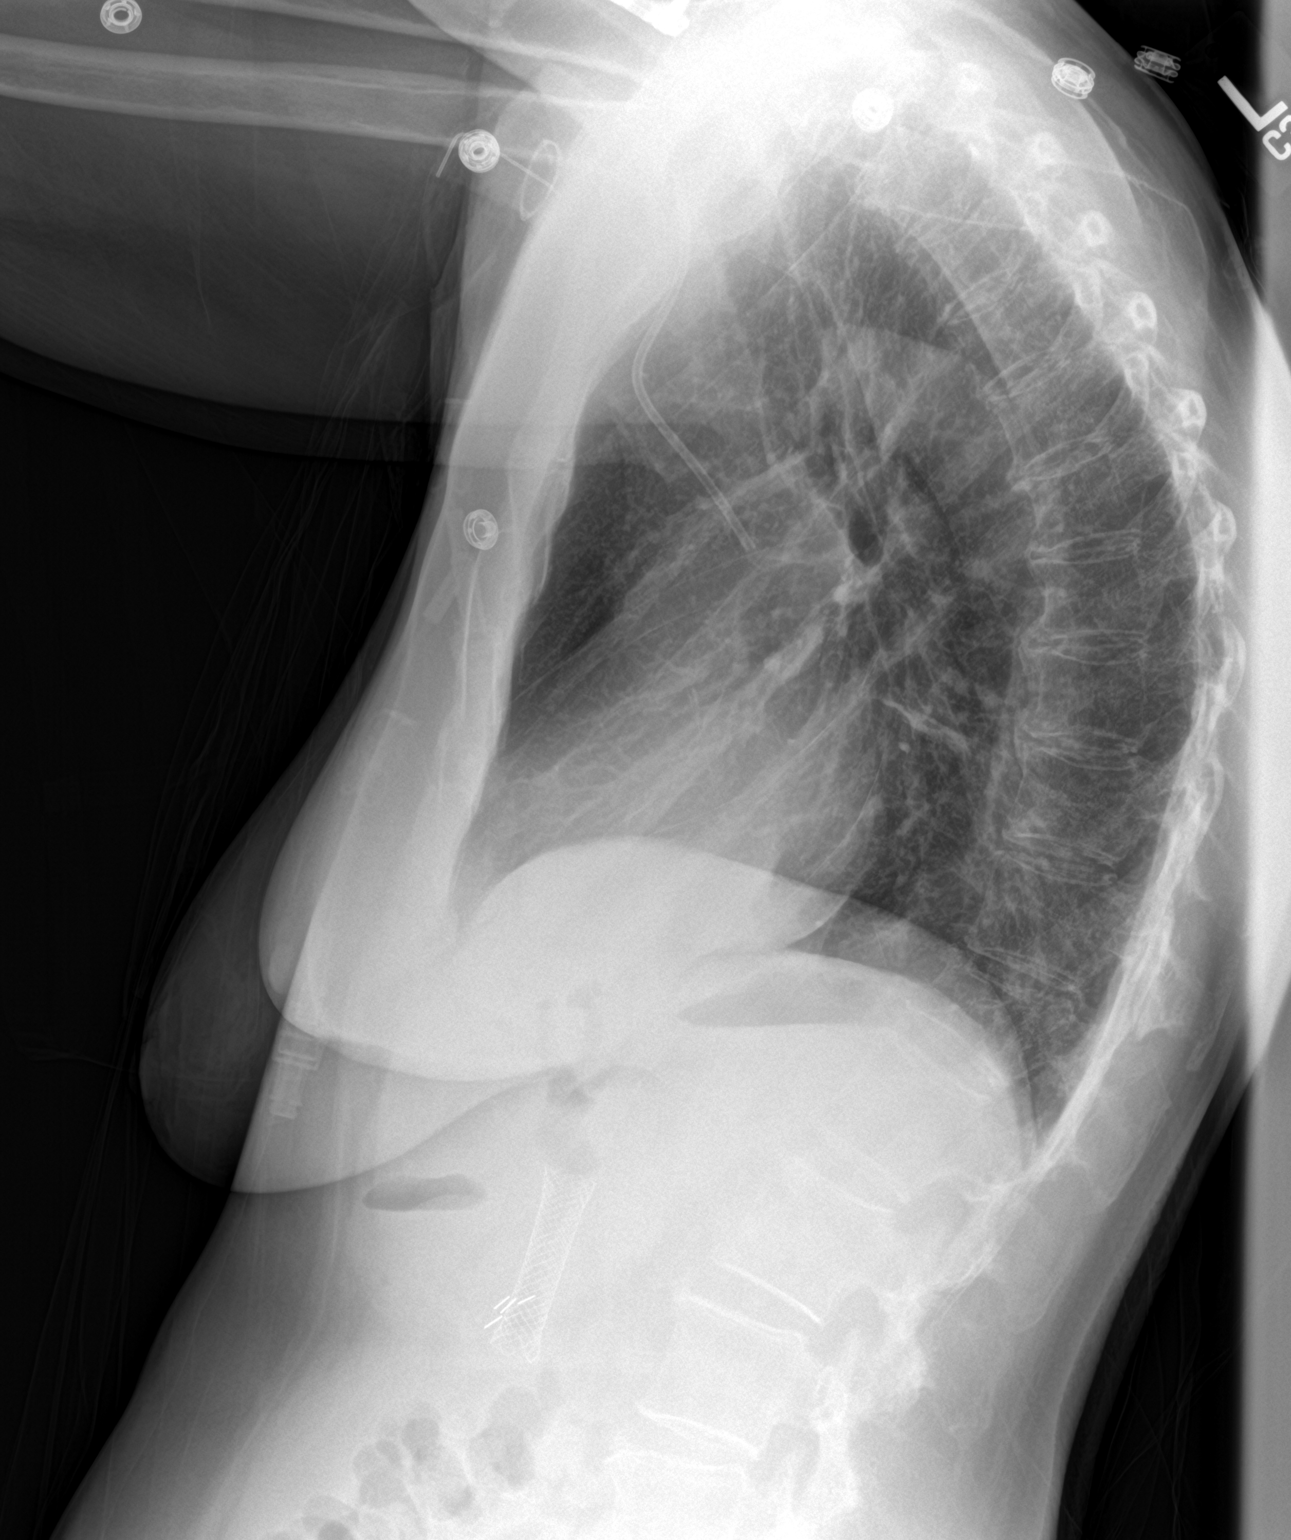

[2 of 2 positions shown; findings below may reference images not displayed]

FINDINGS: Left Port-A-Cath remains in place, unchanged. Heart and mediastinal
contours are within normal limits. No focal opacities or effusions.
No acute bony abnormality. Old mild compression fracture in the
lower thoracic spine.
IMPRESSION: He

## 2020-07-26 IMAGING — CT CT ABD-PELV W/ CM
3 of 9 series · 14 of 46 positions shown, 15 images · IV contrast (omnipaque)
Comparison: CT the abdomen and pelvis 05/20/2019. Chest CT
04/08/2019.

CLINICAL DATA: 73-year-old female with history of pancreatic
neoplasm. Staging examination.

EXAM:
CT CHEST, ABDOMEN, AND PELVIS WITH CONTRAST
TECHNIQUE: Multidetector CT imaging of the chest, abdomen and pelvis was
performed following the standard protocol during bolus
administration of intravenous contrast.
CONTRAST:  100mL OMNIPAQUE IOHEXOL 300 MG/ML  SOLN

[Series 2: axial arterial · axial · arterial · 0.71mm/px · z∈[-385,-292]mm · 3 of 126 slices shown]
[im 16/126  soft-tissue]
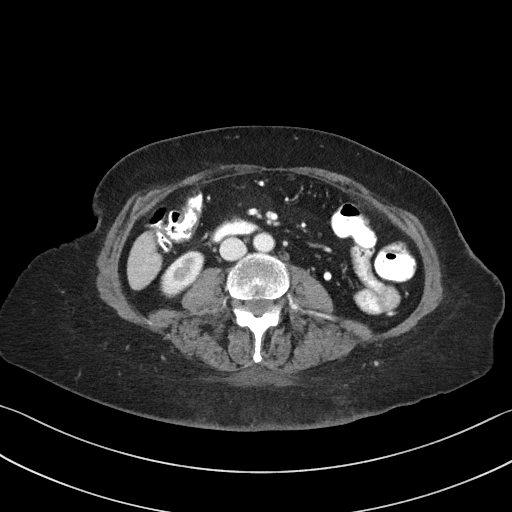
[im 32/126  soft-tissue]
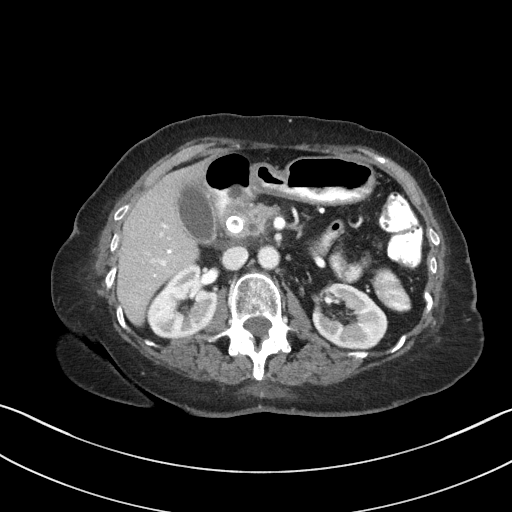
[im 47/126  soft-tissue]
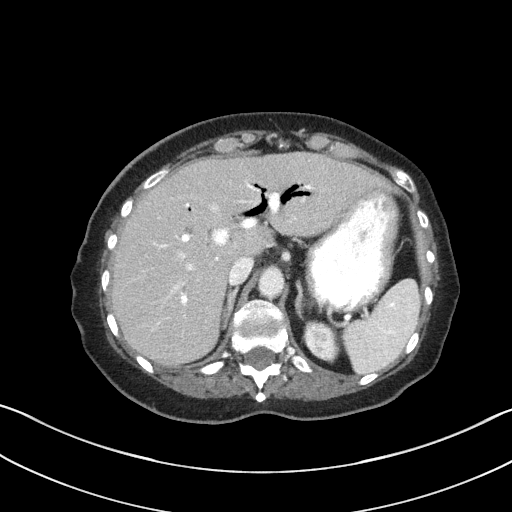

[Series 5: coronal arterial · coronal · arterial · 0.59mm/px · 3 of 92 slices shown]
[im 23/92  soft-tissue]
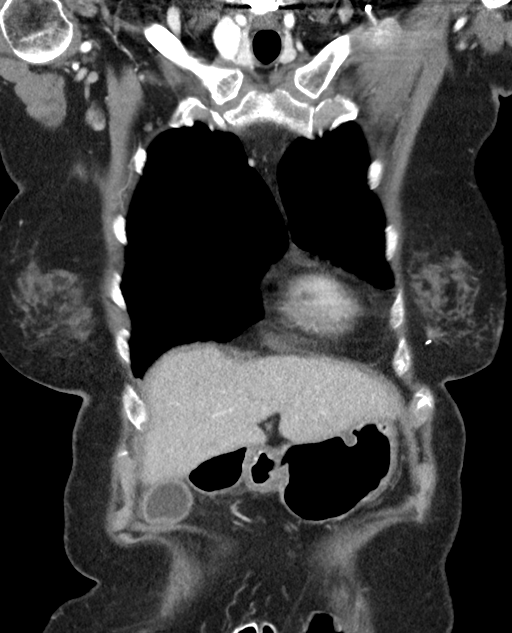
[im 46/92  soft-tissue]
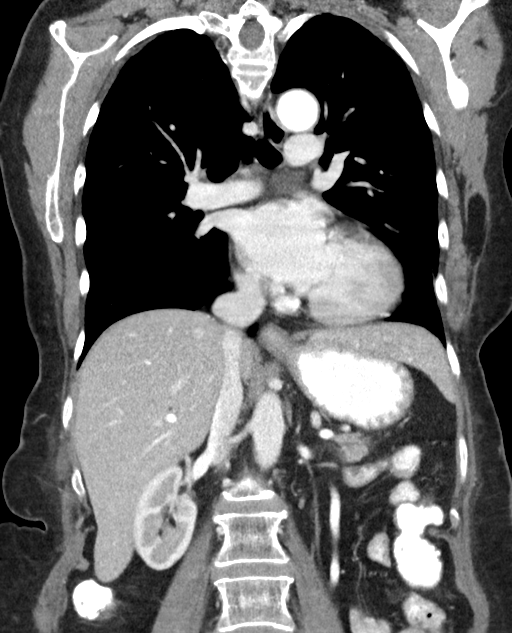
[im 69/92  soft-tissue]
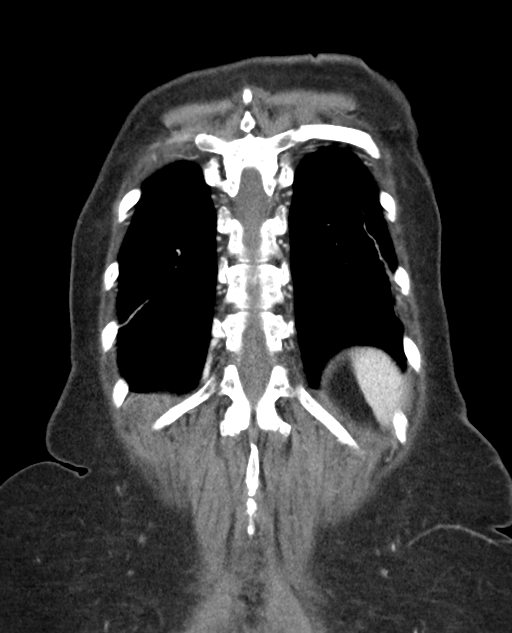

[Series 8: axial venous · axial · portal-venous · 0.65mm/px · z∈[-585,-261]mm · 8 of 140 slices shown, 9 images]
[im 16/140  soft-tissue]
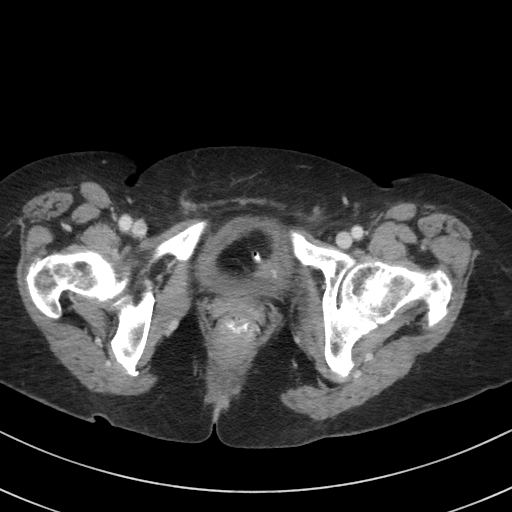
[im 16/140  bone]
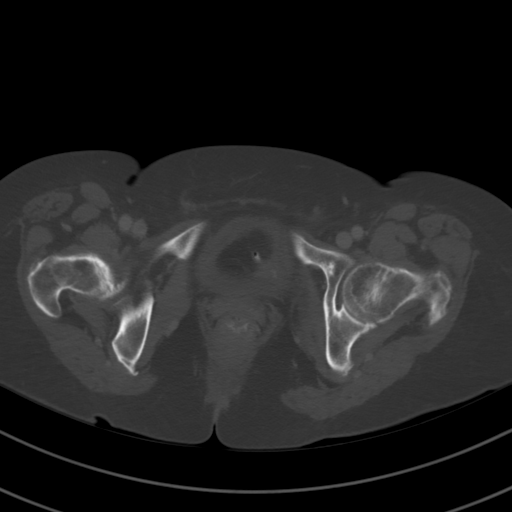
[im 31/140  soft-tissue]
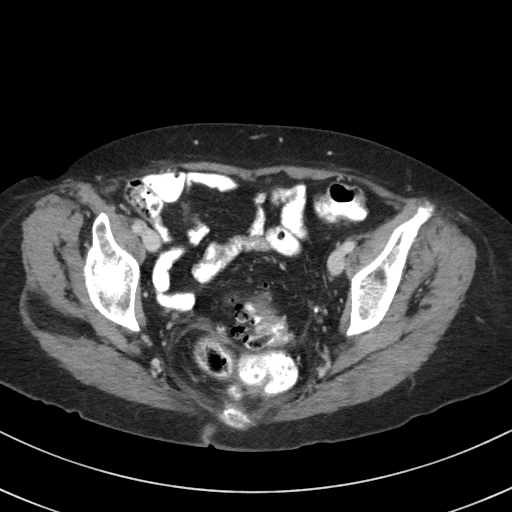
[im 47/140  soft-tissue]
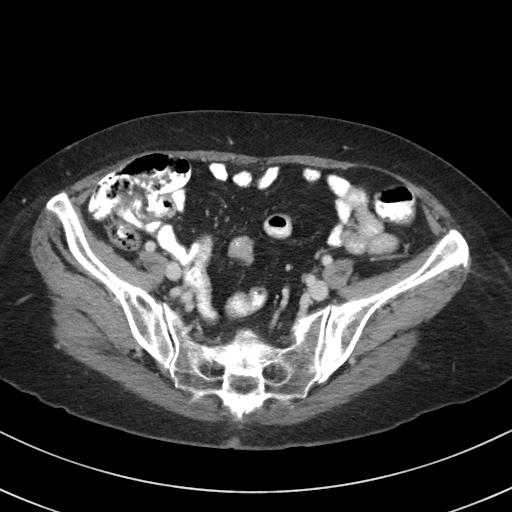
[im 62/140  soft-tissue]
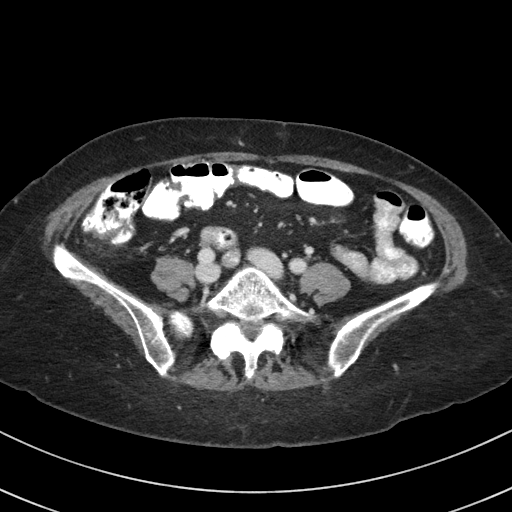
[im 78/140  soft-tissue]
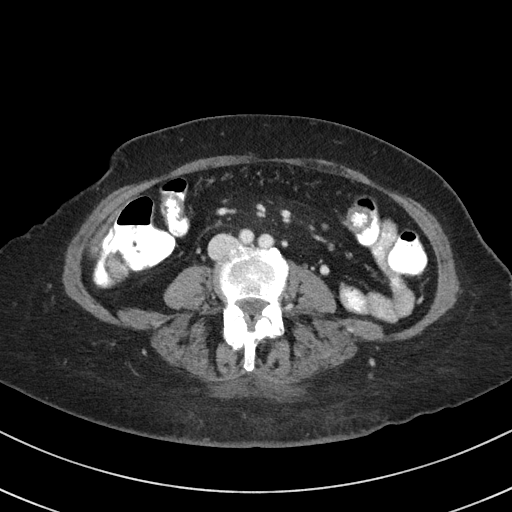
[im 93/140  soft-tissue]
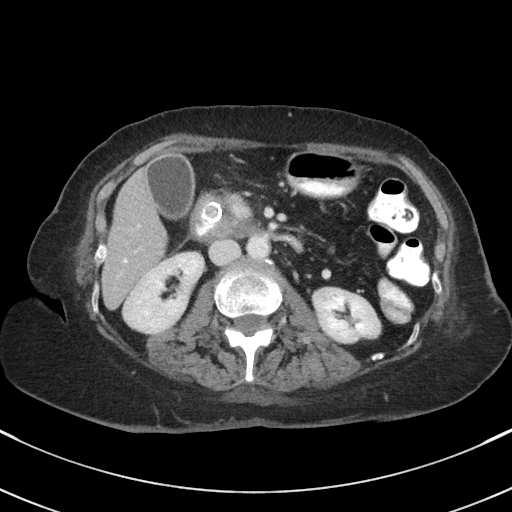
[im 109/140  soft-tissue]
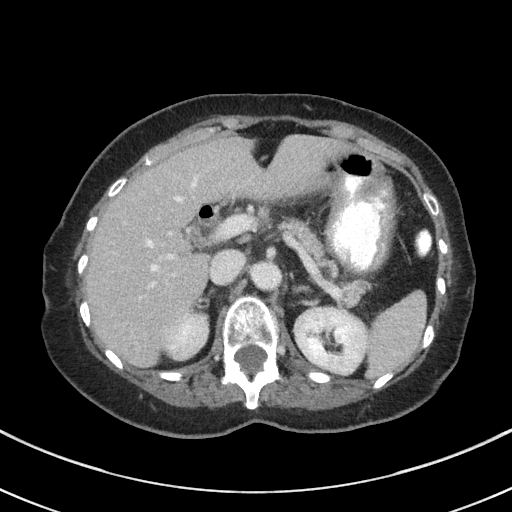
[im 124/140  soft-tissue]
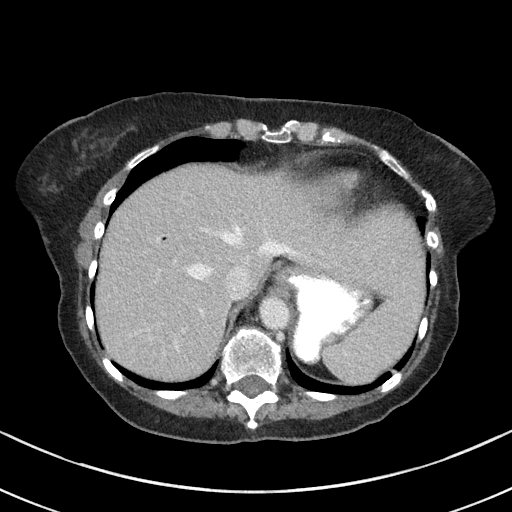

[14 of 46 positions shown; findings below may reference images not displayed]

FINDINGS: CT CHEST FINDINGS

Cardiovascular: Heart size is borderline enlarged. There is no
significant pericardial fluid, thickening or pericardial
calcification. No atherosclerotic calcifications in the thoracic
aorta or the coronary arteries. Left subclavian single-lumen porta
cath with tip terminating in the distal superior vena cava.

Mediastinum/Nodes: No pathologically enlarged mediastinal or hilar
lymph nodes. Please note that accurate exclusion of hilar adenopathy
is limited on noncontrast CT scans. Esophagus is unremarkable in
appearance. No axillary lymphadenopathy.

Lungs/Pleura: No acute consolidative airspace disease. No pleural
effusions. No suspicious appearing pulmonary nodules or masses are
noted. Areas of linear scarring are noted throughout the lower lobes
of the lungs bilaterally and in the posterior aspect of the left
upper lobe, similar to the prior examination

Musculoskeletal: Old healed fracture of the mid sternum with mild
posttraumatic deformity. Chronic appearing compression fracture of
T12 with 30% loss of anterior vertebral body height, unchanged.
There are no aggressive appearing lytic or blastic lesions noted in
the visualized portions of the skeleton.

CT ABDOMEN PELVIS FINDINGS

Hepatobiliary: No suspicious cystic or solid hepatic lesions. No
intra or extrahepatic biliary ductal dilatation. Gallbladder is
normal in appearance. Pneumobilia, particularly in the left lobe of
the liver related to indwelling common bile duct stent which is
patent. Some high attenuation material within the common bile duct
stent likely represents refluxed oral contrast material from the
adjacent duodenum.

Pancreas: The known pancreatic head mass appears slightly larger
than the prior examination, but is poorly defined, estimated to
measure approximately 2.9 x 2.1 x 3.3 cm (axial image 96 of series 2
and coronal image 32 of series 5). New compared to the prior study
within the midst of the lesion there is some metallic densities,
which presumably reflect the recently placed fiducial markers.
Immediately anterior to this intimately associated with the anterior
margin of the pancreatic head as well as the posterior wall of the
duodenal bulb there is a well-defined 1.6 x 1.2 x 1.5 cm centrally
low-attenuation fluid collection, likely to represent a small
pancreatic pseudocyst (axial image 97 of series 2 and coronal image
28 of series 5). Mild pancreatic ductal dilatation measuring up to 5
mm in the proximal body of the pancreas. Mild atrophy throughout the
body and tail of the pancreas.

Spleen: Unremarkable.

Adrenals/Urinary Tract: 1.4 cm low-attenuation lesion in the
anterior aspect of the lower pole the left kidney, compatible with a
simple cyst. Right kidney and bilateral adrenal glands are normal in
appearance. No hydroureteronephrosis. Urinary bladder is nearly
decompressed, but otherwise unremarkable in appearance.

Stomach/Bowel: Normal appearance of the stomach. No pathologic
dilatation of small bowel or colon. Numerous colonic diverticulae
are noted, particularly in the sigmoid colon, without surrounding
inflammatory changes to suggest an acute diverticulitis at this
time. Normal appendix.

Vascular/Lymphatic: No significant atherosclerotic disease, aneurysm
or dissection noted in the abdominal or pelvic vasculature. No
lymphadenopathy noted in the abdomen or pelvis.

Reproductive: Status post hysterectomy. Ovaries are not confidently
identified may be surgically absent or atrophic.

Other: No significant volume of ascites.  No pneumoperitoneum.

Musculoskeletal: There are no aggressive appearing lytic or blastic
lesions noted in the visualized portions of the skeleton.
IMPRESSION: 1. Interval placement of fiducial markers in the lesion in the head
of the pancreas. The pancreatic head mass appears slightly larger
than the prior examination, estimated to measure approximately 2.9 x
2.1 x 3.3 cm on today's study. There continues to be some mild
pancreatic ductal dilatation. Anterior to the lesion there is a
small benign cystic appearing structure between the head of the
pancreas and the adjacent duodenal bulb, which may represent a tiny
pancreatic pseudocyst in the setting of recent EGD for fiducial
marker placement. Attention on follow-up studies is recommended.
2. No definite signs of metastatic disease elsewhere in the chest,
abdomen or pelvis.
3. Extensive colonic diverticulosis without evidence to suggest an
acute diverticulitis at this time.
4. Additional incidental findings, as above.

## 2020-07-26 IMAGING — CT CT CHEST W/ CM
3 of 9 series · 14 of 46 positions shown, 15 images · IV contrast (omnipaque)
Comparison: CT the abdomen and pelvis 05/20/2019. Chest CT
04/08/2019.

CLINICAL DATA: 73-year-old female with history of pancreatic
neoplasm. Staging examination.

EXAM:
CT CHEST, ABDOMEN, AND PELVIS WITH CONTRAST
TECHNIQUE: Multidetector CT imaging of the chest, abdomen and pelvis was
performed following the standard protocol during bolus
administration of intravenous contrast.
CONTRAST:  100mL OMNIPAQUE IOHEXOL 300 MG/ML  SOLN

[Series 2: axial arterial · axial · arterial · 0.71mm/px · z∈[-385,-292]mm · 3 of 126 slices shown]
[im 16/126  soft-tissue]
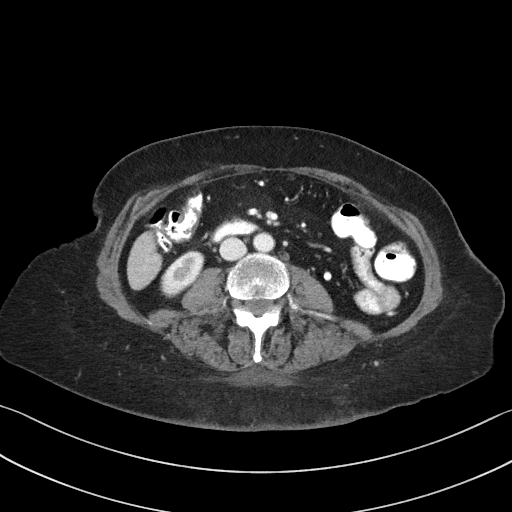
[im 32/126  soft-tissue]
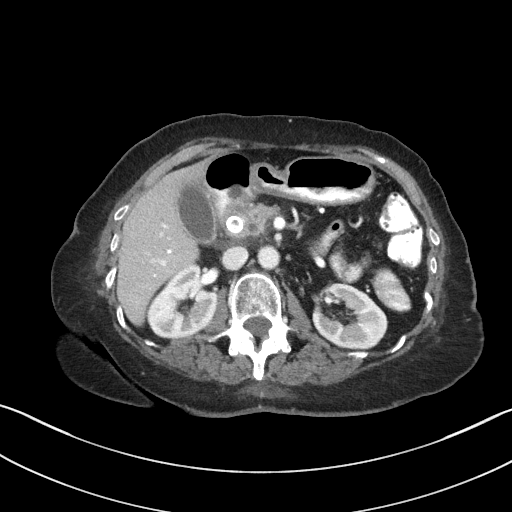
[im 47/126  soft-tissue]
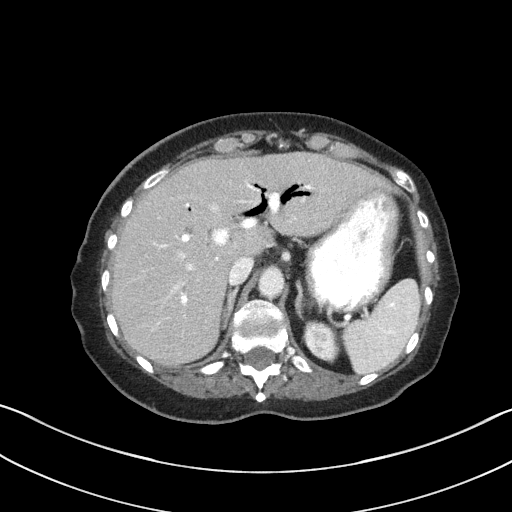

[Series 5: coronal arterial · coronal · arterial · 0.59mm/px · 3 of 92 slices shown]
[im 23/92  soft-tissue]
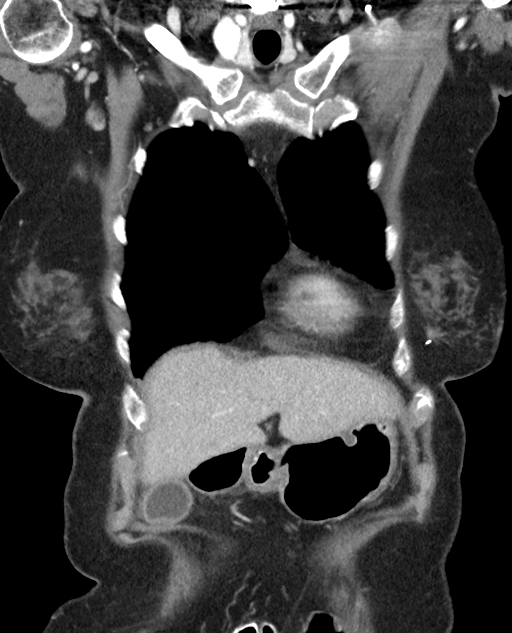
[im 46/92  soft-tissue]
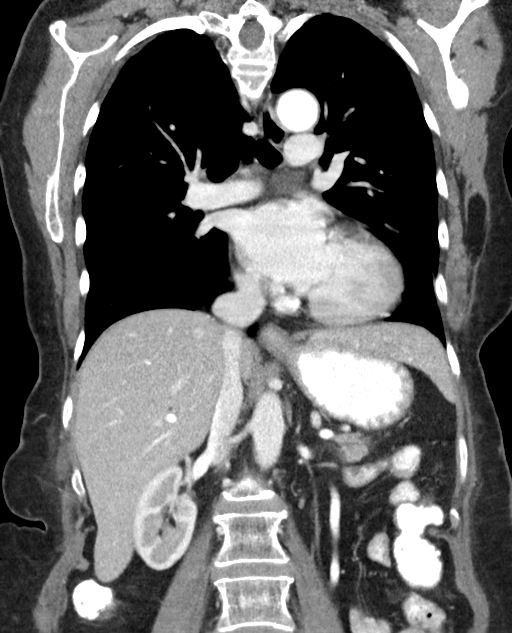
[im 69/92  soft-tissue]
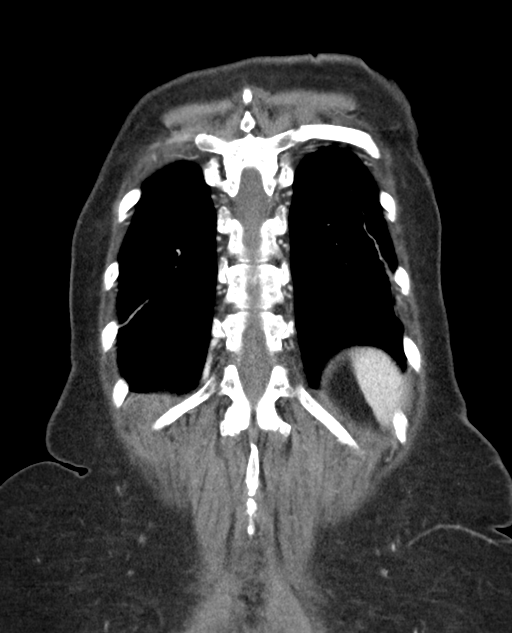

[Series 8: axial venous · axial · portal-venous · 0.65mm/px · z∈[-585,-261]mm · 8 of 140 slices shown, 9 images]
[im 16/140  soft-tissue]
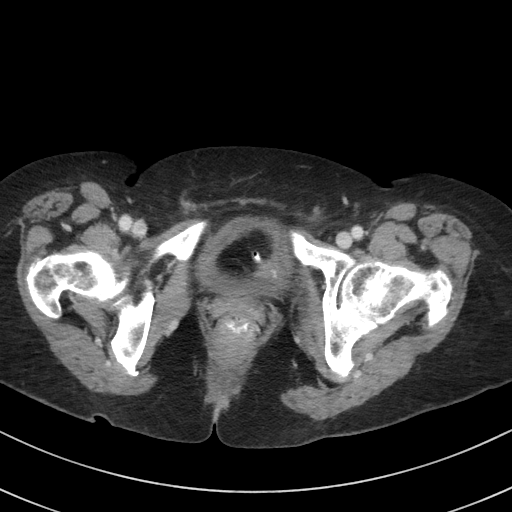
[im 16/140  bone]
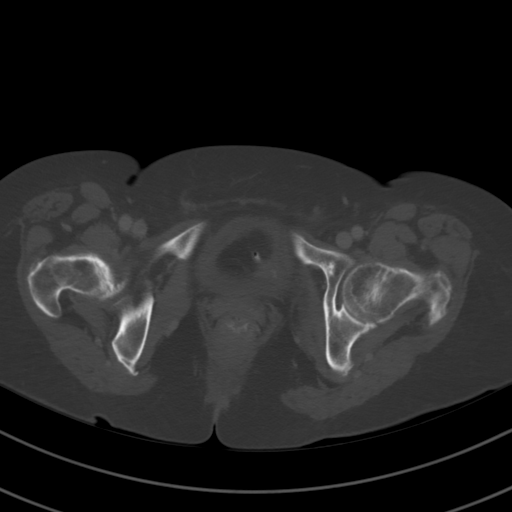
[im 31/140  soft-tissue]
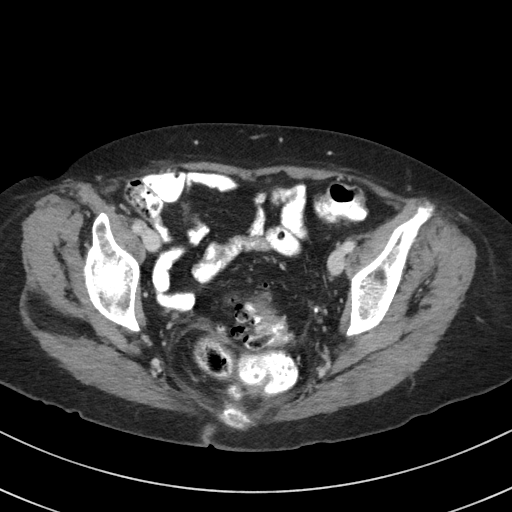
[im 47/140  soft-tissue]
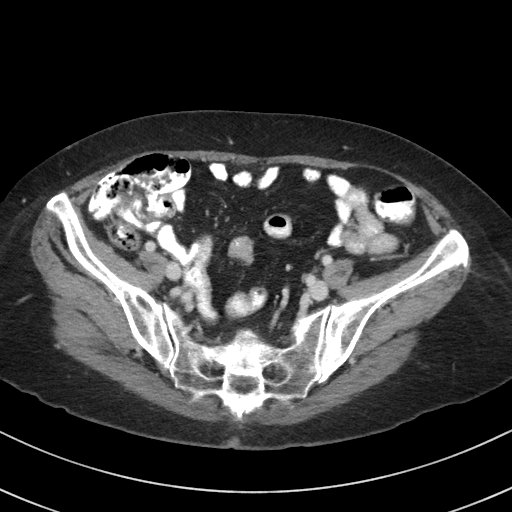
[im 62/140  soft-tissue]
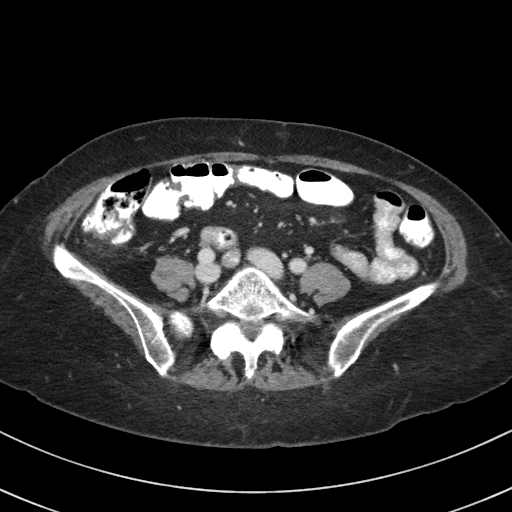
[im 78/140  soft-tissue]
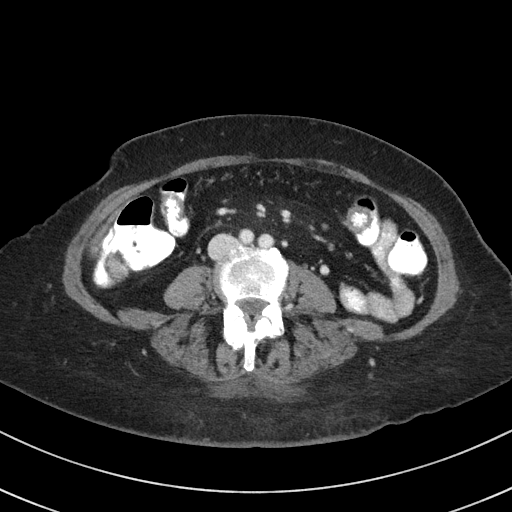
[im 93/140  soft-tissue]
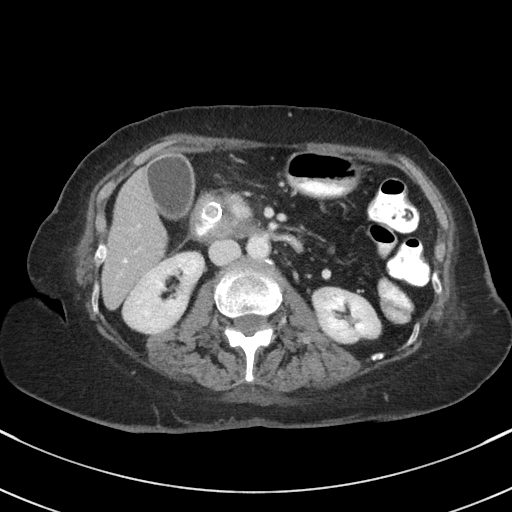
[im 109/140  soft-tissue]
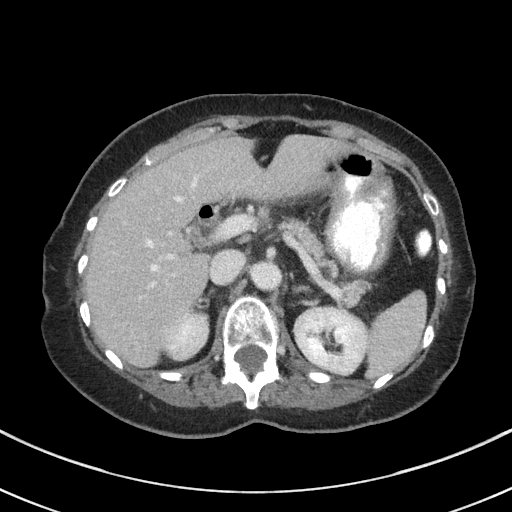
[im 124/140  soft-tissue]
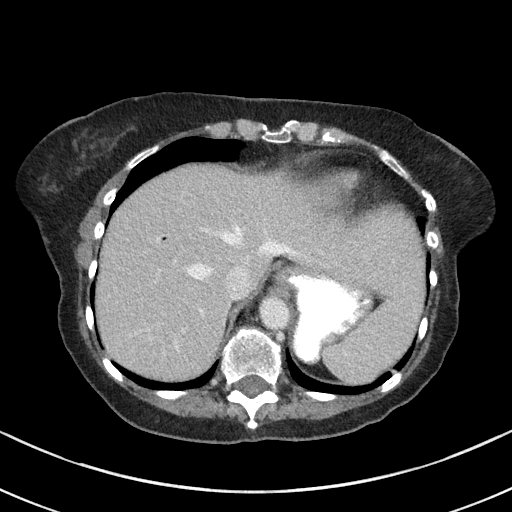

[14 of 46 positions shown; findings below may reference images not displayed]

FINDINGS: CT CHEST FINDINGS

Cardiovascular: Heart size is borderline enlarged. There is no
significant pericardial fluid, thickening or pericardial
calcification. No atherosclerotic calcifications in the thoracic
aorta or the coronary arteries. Left subclavian single-lumen porta
cath with tip terminating in the distal superior vena cava.

Mediastinum/Nodes: No pathologically enlarged mediastinal or hilar
lymph nodes. Please note that accurate exclusion of hilar adenopathy
is limited on noncontrast CT scans. Esophagus is unremarkable in
appearance. No axillary lymphadenopathy.

Lungs/Pleura: No acute consolidative airspace disease. No pleural
effusions. No suspicious appearing pulmonary nodules or masses are
noted. Areas of linear scarring are noted throughout the lower lobes
of the lungs bilaterally and in the posterior aspect of the left
upper lobe, similar to the prior examination

Musculoskeletal: Old healed fracture of the mid sternum with mild
posttraumatic deformity. Chronic appearing compression fracture of
T12 with 30% loss of anterior vertebral body height, unchanged.
There are no aggressive appearing lytic or blastic lesions noted in
the visualized portions of the skeleton.

CT ABDOMEN PELVIS FINDINGS

Hepatobiliary: No suspicious cystic or solid hepatic lesions. No
intra or extrahepatic biliary ductal dilatation. Gallbladder is
normal in appearance. Pneumobilia, particularly in the left lobe of
the liver related to indwelling common bile duct stent which is
patent. Some high attenuation material within the common bile duct
stent likely represents refluxed oral contrast material from the
adjacent duodenum.

Pancreas: The known pancreatic head mass appears slightly larger
than the prior examination, but is poorly defined, estimated to
measure approximately 2.9 x 2.1 x 3.3 cm (axial image 96 of series 2
and coronal image 32 of series 5). New compared to the prior study
within the midst of the lesion there is some metallic densities,
which presumably reflect the recently placed fiducial markers.
Immediately anterior to this intimately associated with the anterior
margin of the pancreatic head as well as the posterior wall of the
duodenal bulb there is a well-defined 1.6 x 1.2 x 1.5 cm centrally
low-attenuation fluid collection, likely to represent a small
pancreatic pseudocyst (axial image 97 of series 2 and coronal image
28 of series 5). Mild pancreatic ductal dilatation measuring up to 5
mm in the proximal body of the pancreas. Mild atrophy throughout the
body and tail of the pancreas.

Spleen: Unremarkable.

Adrenals/Urinary Tract: 1.4 cm low-attenuation lesion in the
anterior aspect of the lower pole the left kidney, compatible with a
simple cyst. Right kidney and bilateral adrenal glands are normal in
appearance. No hydroureteronephrosis. Urinary bladder is nearly
decompressed, but otherwise unremarkable in appearance.

Stomach/Bowel: Normal appearance of the stomach. No pathologic
dilatation of small bowel or colon. Numerous colonic diverticulae
are noted, particularly in the sigmoid colon, without surrounding
inflammatory changes to suggest an acute diverticulitis at this
time. Normal appendix.

Vascular/Lymphatic: No significant atherosclerotic disease, aneurysm
or dissection noted in the abdominal or pelvic vasculature. No
lymphadenopathy noted in the abdomen or pelvis.

Reproductive: Status post hysterectomy. Ovaries are not confidently
identified may be surgically absent or atrophic.

Other: No significant volume of ascites.  No pneumoperitoneum.

Musculoskeletal: There are no aggressive appearing lytic or blastic
lesions noted in the visualized portions of the skeleton.
IMPRESSION: 1. Interval placement of fiducial markers in the lesion in the head
of the pancreas. The pancreatic head mass appears slightly larger
than the prior examination, estimated to measure approximately 2.9 x
2.1 x 3.3 cm on today's study. There continues to be some mild
pancreatic ductal dilatation. Anterior to the lesion there is a
small benign cystic appearing structure between the head of the
pancreas and the adjacent duodenal bulb, which may represent a tiny
pancreatic pseudocyst in the setting of recent EGD for fiducial
marker placement. Attention on follow-up studies is recommended.
2. No definite signs of metastatic disease elsewhere in the chest,
abdomen or pelvis.
3. Extensive colonic diverticulosis without evidence to suggest an
acute diverticulitis at this time.
4. Additional incidental findings, as above.
